# Patient Record
Sex: Male | Born: 1955
Health system: Southern US, Community
[De-identification: ages and names within clinical notes are randomized; demographics above are authoritative.]

## PROBLEM LIST (undated history)

## (undated) DIAGNOSIS — M109 Gout, unspecified: Secondary | ICD-10-CM

## (undated) DIAGNOSIS — E119 Type 2 diabetes mellitus without complications: Secondary | ICD-10-CM

## (undated) DIAGNOSIS — E2740 Unspecified adrenocortical insufficiency: Secondary | ICD-10-CM

## (undated) DIAGNOSIS — G629 Polyneuropathy, unspecified: Secondary | ICD-10-CM

## (undated) DIAGNOSIS — T7840XA Allergy, unspecified, initial encounter: Secondary | ICD-10-CM

## (undated) DIAGNOSIS — I739 Peripheral vascular disease, unspecified: Secondary | ICD-10-CM

## (undated) DIAGNOSIS — I219 Acute myocardial infarction, unspecified: Secondary | ICD-10-CM

## (undated) DIAGNOSIS — I509 Heart failure, unspecified: Secondary | ICD-10-CM

## (undated) DIAGNOSIS — J189 Pneumonia, unspecified organism: Secondary | ICD-10-CM

## (undated) DIAGNOSIS — E7800 Pure hypercholesterolemia, unspecified: Secondary | ICD-10-CM

## (undated) DIAGNOSIS — M1990 Unspecified osteoarthritis, unspecified site: Secondary | ICD-10-CM

## (undated) DIAGNOSIS — B009 Herpesviral infection, unspecified: Secondary | ICD-10-CM

## (undated) DIAGNOSIS — H269 Unspecified cataract: Secondary | ICD-10-CM

## (undated) DIAGNOSIS — G5600 Carpal tunnel syndrome, unspecified upper limb: Secondary | ICD-10-CM

## (undated) DIAGNOSIS — R112 Nausea with vomiting, unspecified: Secondary | ICD-10-CM

## (undated) DIAGNOSIS — I10 Essential (primary) hypertension: Secondary | ICD-10-CM

## (undated) DIAGNOSIS — N529 Male erectile dysfunction, unspecified: Secondary | ICD-10-CM

## (undated) DIAGNOSIS — H5440 Blindness, one eye, unspecified eye: Secondary | ICD-10-CM

## (undated) HISTORY — DX: Peripheral vascular disease, unspecified: I73.9

## (undated) HISTORY — DX: Heart failure, unspecified: I50.9

## (undated) HISTORY — DX: Carpal tunnel syndrome, unspecified upper limb: G56.00

## (undated) HISTORY — DX: Allergy, unspecified, initial encounter: T78.40XA

## (undated) HISTORY — DX: Gout, unspecified: M10.9

## (undated) HISTORY — DX: Male erectile dysfunction, unspecified: N52.9

## (undated) HISTORY — DX: Pneumonia, unspecified organism: J18.9

## (undated) HISTORY — PX: OTHER SURGICAL HISTORY: SHX169

## (undated) HISTORY — DX: Unspecified adrenocortical insufficiency: E27.40

## (undated) HISTORY — DX: Acute myocardial infarction, unspecified: I21.9

## (undated) HISTORY — DX: Pure hypercholesterolemia, unspecified: E78.00

## (undated) HISTORY — DX: Polyneuropathy, unspecified: G62.9

## (undated) HISTORY — PX: CHOLECYSTECTOMY: SHX55

## (undated) HISTORY — DX: Unspecified cataract: H26.9

## (undated) HISTORY — DX: Herpesviral infection, unspecified: B00.9

---

## 1997-09-26 ENCOUNTER — Emergency Department (HOSPITAL_COMMUNITY): Admission: EM | Admit: 1997-09-26 | Discharge: 1997-09-26 | Payer: Self-pay

## 1997-09-28 ENCOUNTER — Ambulatory Visit (HOSPITAL_COMMUNITY): Admission: RE | Admit: 1997-09-28 | Discharge: 1997-09-28 | Payer: Self-pay | Admitting: Family Medicine

## 1997-09-30 ENCOUNTER — Ambulatory Visit (HOSPITAL_COMMUNITY): Admission: RE | Admit: 1997-09-30 | Discharge: 1997-09-30 | Payer: Self-pay | Admitting: Family Medicine

## 1999-06-20 ENCOUNTER — Encounter: Payer: Self-pay | Admitting: Family Medicine

## 1999-06-20 ENCOUNTER — Ambulatory Visit (HOSPITAL_COMMUNITY): Admission: RE | Admit: 1999-06-20 | Discharge: 1999-06-20 | Payer: Self-pay | Admitting: Family Medicine

## 1999-10-06 ENCOUNTER — Encounter: Payer: Self-pay | Admitting: Emergency Medicine

## 1999-10-06 ENCOUNTER — Inpatient Hospital Stay (HOSPITAL_COMMUNITY): Admission: EM | Admit: 1999-10-06 | Discharge: 1999-10-10 | Payer: Self-pay | Admitting: Emergency Medicine

## 1999-10-07 ENCOUNTER — Encounter: Payer: Self-pay | Admitting: Cardiology

## 2003-07-26 ENCOUNTER — Emergency Department (HOSPITAL_COMMUNITY): Admission: EM | Admit: 2003-07-26 | Discharge: 2003-07-26 | Payer: Self-pay | Admitting: Emergency Medicine

## 2004-07-11 ENCOUNTER — Emergency Department (HOSPITAL_COMMUNITY): Admission: EM | Admit: 2004-07-11 | Discharge: 2004-07-12 | Payer: Self-pay | Admitting: Emergency Medicine

## 2004-09-09 ENCOUNTER — Emergency Department (HOSPITAL_COMMUNITY): Admission: EM | Admit: 2004-09-09 | Discharge: 2004-09-09 | Payer: Self-pay | Admitting: Emergency Medicine

## 2005-01-29 ENCOUNTER — Emergency Department (HOSPITAL_COMMUNITY): Admission: EM | Admit: 2005-01-29 | Discharge: 2005-01-29 | Payer: Self-pay | Admitting: *Deleted

## 2005-01-31 ENCOUNTER — Encounter (INDEPENDENT_AMBULATORY_CARE_PROVIDER_SITE_OTHER): Payer: Self-pay | Admitting: Specialist

## 2005-01-31 ENCOUNTER — Inpatient Hospital Stay (HOSPITAL_COMMUNITY): Admission: EM | Admit: 2005-01-31 | Discharge: 2005-02-02 | Payer: Self-pay | Admitting: Emergency Medicine

## 2005-02-26 HISTORY — PX: CORONARY ANGIOPLASTY WITH STENT PLACEMENT: SHX49

## 2005-11-06 ENCOUNTER — Inpatient Hospital Stay (HOSPITAL_COMMUNITY): Admission: EM | Admit: 2005-11-06 | Discharge: 2005-11-08 | Payer: Self-pay | Admitting: Emergency Medicine

## 2005-11-08 ENCOUNTER — Encounter: Payer: Self-pay | Admitting: Vascular Surgery

## 2005-12-13 ENCOUNTER — Encounter (HOSPITAL_COMMUNITY): Admission: RE | Admit: 2005-12-13 | Discharge: 2006-02-01 | Payer: Self-pay | Admitting: Interventional Cardiology

## 2006-01-08 ENCOUNTER — Encounter: Payer: Self-pay | Admitting: Vascular Surgery

## 2006-01-08 ENCOUNTER — Ambulatory Visit (HOSPITAL_COMMUNITY): Admission: RE | Admit: 2006-01-08 | Discharge: 2006-01-08 | Payer: Self-pay | Admitting: Family Medicine

## 2006-01-30 ENCOUNTER — Ambulatory Visit (HOSPITAL_COMMUNITY): Admission: RE | Admit: 2006-01-30 | Discharge: 2006-01-30 | Payer: Self-pay | Admitting: Vascular Surgery

## 2006-04-01 ENCOUNTER — Ambulatory Visit: Payer: Self-pay | Admitting: Vascular Surgery

## 2006-05-24 ENCOUNTER — Ambulatory Visit: Payer: Self-pay | Admitting: Vascular Surgery

## 2008-04-20 ENCOUNTER — Ambulatory Visit: Payer: Self-pay | Admitting: Internal Medicine

## 2008-04-20 ENCOUNTER — Inpatient Hospital Stay (HOSPITAL_COMMUNITY): Admission: EM | Admit: 2008-04-20 | Discharge: 2008-04-23 | Payer: Self-pay | Admitting: Emergency Medicine

## 2008-04-21 ENCOUNTER — Ambulatory Visit: Payer: Self-pay | Admitting: *Deleted

## 2008-04-21 ENCOUNTER — Encounter (INDEPENDENT_AMBULATORY_CARE_PROVIDER_SITE_OTHER): Payer: Self-pay | Admitting: Internal Medicine

## 2008-09-12 ENCOUNTER — Ambulatory Visit: Payer: Self-pay | Admitting: Vascular Surgery

## 2008-09-12 ENCOUNTER — Observation Stay (HOSPITAL_COMMUNITY): Admission: EM | Admit: 2008-09-12 | Discharge: 2008-09-12 | Payer: Self-pay | Admitting: Emergency Medicine

## 2008-09-12 ENCOUNTER — Encounter (INDEPENDENT_AMBULATORY_CARE_PROVIDER_SITE_OTHER): Payer: Self-pay | Admitting: Physician Assistant

## 2010-06-13 LAB — LIPASE, BLOOD: Lipase: 20 U/L (ref 11–59)

## 2010-06-13 LAB — CBC
HCT: 31.8 % — ABNORMAL LOW (ref 39.0–52.0)
HCT: 33.2 % — ABNORMAL LOW (ref 39.0–52.0)
HCT: 36.2 % — ABNORMAL LOW (ref 39.0–52.0)
Hemoglobin: 10.4 g/dL — ABNORMAL LOW (ref 13.0–17.0)
MCV: 84.1 fL (ref 78.0–100.0)
MCV: 84.5 fL (ref 78.0–100.0)
Platelets: 475 10*3/uL — ABNORMAL HIGH (ref 150–400)
Platelets: 565 10*3/uL — ABNORMAL HIGH (ref 150–400)
RBC: 3.72 MIL/uL — ABNORMAL LOW (ref 4.22–5.81)
RBC: 3.95 MIL/uL — ABNORMAL LOW (ref 4.22–5.81)
RBC: 4.28 MIL/uL (ref 4.22–5.81)
RDW: 13.9 % (ref 11.5–15.5)
RDW: 14.1 % (ref 11.5–15.5)
WBC: 10.9 10*3/uL — ABNORMAL HIGH (ref 4.0–10.5)
WBC: 12 10*3/uL — ABNORMAL HIGH (ref 4.0–10.5)
WBC: 12.6 10*3/uL — ABNORMAL HIGH (ref 4.0–10.5)
WBC: 9.4 10*3/uL (ref 4.0–10.5)

## 2010-06-13 LAB — COMPREHENSIVE METABOLIC PANEL
ALT: 21 U/L (ref 0–53)
AST: 18 U/L (ref 0–37)
Albumin: 2.5 g/dL — ABNORMAL LOW (ref 3.5–5.2)
CO2: 25 mEq/L (ref 19–32)
Calcium: 9.1 mg/dL (ref 8.4–10.5)
Chloride: 96 mEq/L (ref 96–112)
Creatinine, Ser: 1.18 mg/dL (ref 0.4–1.5)
GFR calc Af Amer: >60 mL/min (ref >60)
Sodium: 134 mEq/L — ABNORMAL LOW (ref 135–145)

## 2010-06-13 LAB — GLUCOSE, CAPILLARY
Glucose-Capillary: 162 mg/dL — ABNORMAL HIGH (ref 70–99)
Glucose-Capillary: 233 mg/dL — ABNORMAL HIGH (ref 70–99)
Glucose-Capillary: 275 mg/dL — ABNORMAL HIGH (ref 70–99)
Glucose-Capillary: 296 mg/dL — ABNORMAL HIGH (ref 70–99)
Glucose-Capillary: 303 mg/dL — ABNORMAL HIGH (ref 70–99)
Glucose-Capillary: 375 mg/dL — ABNORMAL HIGH (ref 70–99)

## 2010-06-13 LAB — BASIC METABOLIC PANEL
CO2: 26 mEq/L (ref 19–32)
CO2: 27 mEq/L (ref 19–32)
Chloride: 99 mEq/L (ref 96–112)
GFR calc non Af Amer: >60 mL/min (ref >60)
GFR calc non Af Amer: >60 mL/min (ref >60)
Glucose, Bld: 207 mg/dL — ABNORMAL HIGH (ref 70–99)
Glucose, Bld: 225 mg/dL — ABNORMAL HIGH (ref 70–99)
Potassium: 3.7 mEq/L (ref 3.5–5.1)
Potassium: 4.2 mEq/L (ref 3.5–5.1)
Sodium: 135 mEq/L (ref 135–145)
Sodium: 136 mEq/L (ref 135–145)

## 2010-06-13 LAB — CULTURE, BLOOD (ROUTINE X 2): Culture: NO GROWTH

## 2010-06-13 LAB — DIFFERENTIAL
Basophils Absolute: 0 10*3/uL (ref 0.0–0.1)
Basophils Absolute: 0.1 10*3/uL (ref 0.0–0.1)
Eosinophils Absolute: 0 10*3/uL (ref 0.0–0.7)
Eosinophils Absolute: 0.1 10*3/uL (ref 0.0–0.7)
Eosinophils Relative: 0 % (ref 0–5)
Eosinophils Relative: 1 % (ref 0–5)
Lymphocytes Relative: 12 % (ref 12–46)
Lymphocytes Relative: 15 % (ref 12–46)
Lymphocytes Relative: 15 % (ref 12–46)
Lymphocytes Relative: 22 % (ref 12–46)
Lymphs Abs: 1.3 10*3/uL (ref 0.7–4.0)
Lymphs Abs: 1.9 10*3/uL (ref 0.7–4.0)
Lymphs Abs: 2.1 10*3/uL (ref 0.7–4.0)
Monocytes Absolute: 0.9 10*3/uL (ref 0.1–1.0)
Monocytes Absolute: 1.1 10*3/uL — ABNORMAL HIGH (ref 0.1–1.0)
Monocytes Relative: 9 % (ref 3–12)
Neutro Abs: 6 10*3/uL (ref 1.7–7.7)
Neutro Abs: 8.6 10*3/uL — ABNORMAL HIGH (ref 1.7–7.7)
Neutrophils Relative %: 74 % (ref 43–77)
Neutrophils Relative %: 79 % — ABNORMAL HIGH (ref 43–77)

## 2010-06-13 LAB — CARDIAC PANEL(CRET KIN+CKTOT+MB+TROPI)
CK, MB: 1.5 ng/mL (ref 0.3–4.0)
Relative Index: 0.5 (ref 0.0–2.5)
Relative Index: 0.5 (ref 0.0–2.5)

## 2010-06-13 LAB — HEPARIN LEVEL (UNFRACTIONATED)
Heparin Unfractionated: 0.18 IU/mL — ABNORMAL LOW (ref 0.30–0.70)
Heparin Unfractionated: 0.2 IU/mL — ABNORMAL LOW (ref 0.30–0.70)

## 2010-06-13 LAB — CK TOTAL AND CKMB (NOT AT ARMC)
Relative Index: 0.4 (ref 0.0–2.5)
Total CK: 299 U/L — ABNORMAL HIGH (ref 7–232)

## 2010-07-11 NOTE — Consult Note (Signed)
NAMEVERLIN, UHER                 ACCOUNT NO.:  [id number]   MEDICAL RECORD NO.:  [id number]          PATIENT TYPE:  INP   LOCATION:  4732                         FACILITY:  [facility]   PHYSICIAN:  Bevelyn Buckles. Bensimhon, MDDATE OF BIRTH:  [birth year]   DATE OF CONSULTATION:  04/20/2008  DATE OF DISCHARGE:                                 CONSULTATION   REASON FOR CONSULTATION:  Non-ST elevation myocardial infarction.   HISTORY OF THE PRESENT ILLNESS:  The patient is a very pleasant 55-year-  old male with a history of hypertension, diabetes, peripheral arterial  disease, previous nonischemic cardiomyopathy with recovered ejection  fraction, and coronary artery disease.  He is followed by Dr. Eldridge Dace.  He first underwent cardiac catheterization in 2001, which showed a  severe dilated cardiomyopathy with an EF of 24%.  He had single-vessel  coronary artery disease with high-grade disease in the left circumflex;  this was treated medically.  Back in September 2007 he presented with  unstable angina.  He underwent cardiac catheterization and the ejection  fraction had normalized, he had two-vessel coronary artery disease with  persistent high-grade disease in the left circumflex, and high-grade  disease in the mid and distal right coronary.  He underwent stenting of  these lesions of with Cypher drug-eluting stents.  He has done well  since that time.   Over the past 2 weeks he has had severe coughing with fevers and chills.  Over the past few days he has now developed nausea, vomiting and  diarrhea.  He has been unable to hold any of his medications down.  Last  night he developed chest pain.  This was pleuritic in nature, worse with  coughing; however, the chest pain persisted throughout the evening so he  came to the ER today for evaluation.  Chest x-ray showed a left lower  lobe pneumonia.  EKG showed sinus rhythm with LVH.  He had some  nonspecific ST-T wave changes inferiorly, which  were new since 2007.  His cardiac markers showed a troponin of 1.42 with a CK of 299 and MB of  1.38.   REVIEW OF SYSTEMS:  The review of systems is as per the HPI and problem  list, otherwise all systems are negative.   PAST MEDICAL HISTORY:  1. A history of nonischemic cardiomyopathy.      a.     Cardiac catheterization in 2001 showed an EF of 24% ; this       has subsequently recovered.  2. Coronary artery disease.      a.     Most recent catheterization in the 2007 showed two-vessel       disease with high-grade disease in the left circumflex, which was       chronic and two high-grade lesions in the right coronary.  He is       status post Cypher drug-eluting stents to both.  3. Severe peripheral arterial disease with total occlusion of      bilateral superficial femoral arteries, followed by Dr. Arbie Cookey.  4. Chronic hypertension.  5. Diabetes.  6. Hyperlipidemia.  ALLERGIES:  No known drug allergies.   MEDICATIONS:  The patient's medications currently are:  1. Aspirin 325.  2. Diovan 320/25 a day.  3. Plavix 75 a day.  4. Metformin 1,000 twice a day.  5. Insulin.   SOCIAL HISTORY:  The patient is retired and disabled.  He denies any  tobacco or alcohol use.   FAMILY HISTORY:  The patient family history is positive in that his  father died of an MI at age 29.  Mother died and had diabetes and breast  cancer at age 69.   PHYSICAL EXAMINATION:  GENERAL APPEARANCE:  On physical exam he is in no  acute distress.  VITAL SIGNS:  Respirations are unlabored.  Blood pressure is 150/70,  heart rate is 88 and he is satting 94% on room air.  HEENT:  The head, eyes, ears, nose, and throat are normal.  NECK:  The neck is thick and supple.  There is no JVD.  Carotids are 2+  bilaterally with a prominent bruit on the left.  HEART:  Cardiac - the PMI is nondisplaced.  Regular rate and rhythm.  No  murmurs, rubs or gallops.  LUNGS:  The lungs are clear.  ABDOMEN:  The abdomen is  obese, nontender and nondistended.  No  hepatosplenomegaly.  No bruits.  No masses.  Good bowel sounds.  EXTREMITIES:  The extremities are warm with no cyanosis, clubbing or  edema.  No rash.  NEUROLOGIC EXAMINATION:  Neuro - shows he is alert and oriented x3.  Cranial nerves II-XII are intact.  He moves all four extremities without  difficulty.  PSYCHIATRIC:  Affect is pleasant.   LABORATORY DATA:  EKG shows sinus rhythm with LVH at a rate of 94.  There is mild nonspecific ST-T wave abnormalities in the inferior leads.  These are new, ischemia versus repolarization.  Chest x-ray shows left  lower pneumonia.  Labs show a white count of 12.0 with 76% neutrophils,  hemoglobin of 12.0 and platelet of 565,000.  Sodium 134, potassium 4.5,  BUN 15, creatinine 1.18, and glucose 203.  Troponin is 1.42.  Lipase is  16.  CK/MB is 299 with an MB of 1.3.   ASSESSMENT:  1. Non-ST elevation myocardial infarction.  2. Known coronary artery disease status post previous stenting of the      right coronary artery.  3. Pneumonia.  4. Recent gastrointestinal illness.  5. Diabetes.  6. Hypertension.   PLANS AND DISCUSSION:  I suspect he has had some demand ischemia in the  setting of his pneumonia and GI illness though I do not see any evidence  of stent thrombosis despite being unable to take his Plavix.  Given his  positive troponin with negative MBs I suspect that his non-ST elevation  myocardial infarction maybe 24 hours old or so.  1. At this point he will be admitted to the primary care service for      further management of this pneumonia and GI illness.  2. We will continue cycle cardiac markers.  3. We will treat him with heparin, beta blocker, Plavix and aspirin.  4. The patient will see Dr. Eldridge Dace tomorrow for further plan of      care.  5. We will also ultrasound his left carotid to evaluate his bruit.      Bevelyn Buckles. Bensimhon, MD  Electronically Signed     DRB/MEDQ  D:   04/21/2008  T:  04/21/2008  Job:  [id number]

## 2010-07-11 NOTE — H&P (Signed)
[name], [name]                 ACCOUNT NO.:  [id number]   MEDICAL RECORD NO.:  [id number]          PATIENT TYPE:  EMS   LOCATION:  MAJO                         FACILITY:  [facility]   PHYSICIAN:  Hollice Espy, M.D.DATE OF BIRTH:  [birth year]   DATE OF ADMISSION:  04/20/2008  DATE OF DISCHARGE:                              HISTORY & PHYSICAL   PRIMARY CARE PHYSICIAN:  Robert A. Nicholos Johns, M.D.   CARDIOLOGIST:  Corky Crafts, MD.   CHIEF COMPLAINT:  Chest pain.   HISTORY OF PRESENT ILLNESS:  The Patient is a 52-year white male who for  the past couple weeks has had right upper respiratory symptoms including  cough, congestion and some mild shortness of breath and then starting  yesterday started complaining of some chest pain described as both  midsternal as well as midepigastric radiating to his back.  He went in  to be evaluated and found to have a white count of 12 with 76% shift.  The rest of his labs are noted for a glucose of 203, albumin 2.5 and  most concerning was his cardiac markers with a CPK of 299 and MB of 1.33  and troponin of 1.42.  A chest x-ray noted a left lower lobe  consolidation consistent with pneumonia.  The patient was seen by Dr.  Nicholes Mango covering for [facility] and was started on heparin  with serial cardiac enzymes.  Dr. Gala Romney felt that perhaps this could  be strain from his URI versus pneumonia leading to some cardiac  ischemia.  I saw the patient who was breathing much more comfortably.  He said his breathing was easier.  He denied any chest pain, currently  denied any headaches, vision changes, dysphagia, no palpitations.  No  current wheezing.  Did complain of some mild cough, mostly  nonproductive.  No abdominal pain.  No hematuria, dysuria, constipation,  diarrhea, focal extremity numbness, weakness or pain.  His review of  systems was otherwise negative.   PAST MEDICAL HISTORY:  1. Diabetes mellitus.  2. CAD.  3. History  of MI.  4. Hypertension.   MEDICATIONS:  1. Aspirin 325.  2. Diovan/HCTZ 320/25.  3. Plavix 75.  4. Metoprolol 25 b.i.d.  5. Metformin 1000 p.o. b.i.d.  6. Insulin 70/30 52 units in the morning 32 units in the evening.   ALLERGIES:  IV DYE.   SOCIAL HISTORY:  No tobacco, alcohol or drug use.   FAMILY HISTORY:  Noncontributory.   PHYSICAL EXAMINATION:  VITALS:  On admission, temperature 98.9, heart  rate 92, blood pressure 158/81, respirations 16, O2 sat 94% on room air,  96% on 2 liters.  GENERAL:  He is alert and oriented x3 in no apparent distress.  HEENT:  Normocephalic atraumatic.  Mucous membranes slightly dry.  He  has a narrow airway.  HEART:  Regular rate and rhythm S1-S2, 2/6 systolic ejection murmur.  LUNGS:  Decreased breath sounds at bases.  ABDOMEN:  Soft, obese, nontender, positive bowel sounds.  EXTREMITIES:  No clubbing, cyanosis, trace pitting edema.   LABORATORY DATA:  Chest x-ray is  as per HPI.  White count 12, H&H 12 and  36, MCV of 85, platelet count 565, no shift.  Sodium 134, potassium 4.5,  chloride 96, bicarb 25, BUN 15, creatinine 1.18, glucose 203.  LFTs  noted for albumin 2.5.  CPK 299, MB 1.3, troponin I 1.42.  I have  ordered a lipase level at the next blood draw with serial cardiac  enzymes.   ASSESSMENT/PLAN:  1. Non-ST elevated MI (?) strain from pneumonia.  2. Pneumonia.  3. Diabetes mellitus.  Will plan to start IV Avelox, check serial      markers.  Cardiology is following.  Started on heparin.  Continue      sliding scale plus basal insulin and check a lipase level to rule      out any abdominal pathology given his complaints of midepigastric      pain.      Hollice Espy, M.D.  Electronically Signed     SKK/MEDQ  D:  04/20/2008  T:  04/21/2008  Job:  [id number]   cc:   Molly Maduro A. Nicholos Johns, M.D.  Corky Crafts, MD  Bevelyn Buckles. Bensimhon, MD

## 2010-07-11 NOTE — Discharge Summary (Signed)
NAMECOLBEY, WIRTANEN                 ACCOUNT NO.:  [id number]   MEDICAL RECORD NO.:  [id number]          PATIENT TYPE:  INP   LOCATION:  2507                         FACILITY:  [facility]   PHYSICIAN:  Corinna L. Lendell Caprice, MDDATE OF BIRTH:  11/23/1955   DATE OF ADMISSION:  04/20/2008  DATE OF DISCHARGE:  04/23/2008                               DISCHARGE SUMMARY   DISCHARGE DIAGNOSES:  1. Non-ST segment elevation myocardial infarction.  2. Left lower lobe pneumonia.  3. Hypertension.  4. Diabetes.  5. IV CONTRAST allergy.  6. Mid right coronary artery disease, found on catheterization, status      post drug-eluting stent.  7. History of previous myocardial infarction.   DISCHARGE MEDICATIONS:  1. Aspirin 325 mg a day.  2. Plavix 75 mg a day.  3. Avelox 400 mg a day until gone.  4. Tessalon 200 mg p.o. q.8 h p.r.n. cough.  5. Continue Diovan/HCT 320/25 mg a day.  6. Metoprolol 25 mg twice a day.  7. Metformin 1000 mg twice a day.  8. Insulin 70/30, 52 units subcutaneously every morning and 32 units      subcutaneously every evening.   CONDITION:  Stable.   ACTIVITY:  Increase slowly.   FOLLOWUP:  With Dr. Eldridge Dace on March 12, at 11:30 a.m.  Follow up with  Dr. Elias Else in 2-4 weeks, will need repeat chest x-ray in 4-6  weeks.   DIET:  Should be diabetic, low-salt.   PROCEDURES:  Cardiac catheterization and drug-eluting stent as above.  Normal ejection fraction.  Please see cath report for full details.   LABORATORY DATA:  White count on admission was 12,000 with a normal  differential.  At discharge, his white count is 11,000.  His complete  metabolic panel on admission significant for a glucose of 203, albumin  of 2.5, otherwise unremarkable.  Peak troponin was 1.42, peak CPK 308,  and MB 1.5.  Lipase normal.  Blood cultures to date are negative.   SPECIAL STUDIES:  Radiology; chest x-ray on admission showed left lower  lobe consolidation.  EKG showed normal sinus  rhythm, prolonged QT.   HISTORY AND HOSPITAL COURSE:  Mr. Lehnen is a 55 year old black male  patient of Dr. Nicholos Johns who presented with chest pain.  He had had cough  and chest congestion with some mild shortness of breath for several  weeks.  He developed chest pain the day prior to admission.  He has a  history of previous MI.  He had been having difficulty with nausea and  apparently skipped his cardiac medications for a bit.  EKG showed no ST  elevation, but he had a positive troponin and a chest x-ray showing  pneumonia.  He was seen by Cardiology who recommended heparin and  cardiac catheterization as well as continuing the aspirin, Plavix, beta-  blocker, etc.  He was admitted to Internal Medicine and started on  antibiotics as well as cough suppressants.  On admission, he had a blood  pressure of 158/81, otherwise normal vital signs.  He had dry mucous  membranes, decreased breath sounds,  otherwise unremarkable exam.  At the  time of discharge, he had had no chest pain.  His cough had improved.  He was cleared by Cardiology and has been off oxygen and able to  ambulate and tolerate diet and medications.   Total time on the day of discharge is 40 minutes.      Corinna L. Lendell Caprice, MD  Electronically Signed     CLS/MEDQ  D:  04/23/2008  T:  04/24/2008  Job:  [id number]   cc:   Molly Maduro A. Nicholos Johns, M.D.  Corky Crafts, MD

## 2010-07-14 NOTE — Discharge Summary (Signed)
[facility]  Patient:    [name], [name]                        MRN: [id number] Adm. Date:  [id number] Disc. Date: [id number] Attending:  [place], Peter Manning CC:         Willis Modena. Dreiling, M.D.             Robert A. Eliezer Lofts., M.D.                           Discharge Summary  HISTORY OF PRESENT ILLNESS:  [name] is a 55 year old black male with a history of insulin-dependent diabetes mellitus and hypertension that have been poorly controlled who presents with acute onset of shortness of breath, was found to be in acute pulmonary edema.  He has no prior history of cardiac disease and denies any history of angina or myocardial infarction.  For details of his past medical history, social history, and physical examination, please see his admission history and physical.  LABORATORY DATA:  Chest x-ray showed cardiomegaly with acute pulmonary edema. ECG showed normal sinus rhythm, left atrial enlargement and nonspecific T wave abnormality.  His white count was 7100, hemoglobin 14.7, hematocrit 44.4, platelet count was 323,000.  Pro time was normal at 12.9, PTT was 27.  Sodium 142, potassium 3.7, chloride 108, CO2 20.  Glucose is 194, BUN 12, creatinine 0.9.  All other chemistries were normal.  CK was 289, negative MB at 2.8.  Troponin I was 0.08.  Serial cardiac enzymes showed no evidence of myocardial infarction. Lipid profile showed a total cholesterol of 237, LDL cholesterol of 0.78, triglycerides of 97 and HDL of 40.  TSH was normal at 3.55.  Urinalysis was benign.  HOSPITAL COURSE:  The patient was admitted to the intensive care unit.  He was initially severely hypertensive.  With treatment in the emergency room with diuretics and nitroglycerin, his blood pressure dropped.  This stabilized with IV fluid bolus and he was able to be restarted on his IV nitroglycerin.  He was diuresed with IV Lasix q.12h.  He was also placed on IV heparin and aspirin.  The  patient had a very effective diureses, marked improvement in his shortness of breath, decline in his weight and improvement of his pulmonary congestion by examination.  Followup chest x-ray showed persistent cardiomegaly with resolution of pulmonary edema.  His serial ECG showed no acute changes.  In addition he was started on an ACE inhibitor with Altace and this was progressively increased by the time of discharge up to 5 mg twice a day.  With resolution of his pulmonary edema he was also started on Coreg at a low dose of 3.125 mg twice a day.  He was started on Lipitor for his hypercholesterolemia.  He was maintained on insulin at his prior dose, but during his hospital stay his sugar dropped and so his insulin dose was reduced.  The patient had no further chest pain.  He did have an echocardiogram performed and this demonstrated moderate left ventricular dilatation and markedly reduced global left ventricular systolic function, ejection fraction estimated at 20-30%.  There is akinesis of the inferior wall septum with severe hypokinesia of the anterior wall and apex.  There was mild concentric LVH.  There was mild aortic sclerosis.  The left atrium was moderately dilated.  There was no significant valvular pathology.  There is no pericardial  effusion.  With resolution of his congestive heart failure, the patient was transferred to telemetry.  He progressively ambulated without difficulty.  On October 09, 1999, the patient underwent right and left heart catheterization.  This demonstrated normal right heart pressures with a right atrial pressure, mean 7 mmHg.  His right ventricular pressure was 35/11.  PA pressure was 33/14 with a mean of 22.  Pulmonary capillary wedge pressure, mean was 16 mmHg.  His cardiac output was 4.6 L/min. with an index of 2.0. There was no evidence of shunt.  Left ventricular angiography demonstrated moderate left ventricular enlargement with severe global  hypokinesia. Ejection fraction was calculated at 24%.  There was no mitral regurgitation. Coronary angiography was significant for an 80-90% stenosis in the mid to distal left circumflex.  This was the only significant obstruction that he had.  It was felt that based on these results, his cardiomyopathy was nonischemic since the degree of atherosclerosis could not explain the severity of his left ventricular dysfunction.  While the circumflex lesion did appear to be amenable to angioplasty it was felt that this was not indicated unless the patient had significant anginal symptoms.  Further adjustments in his medications, his blood pressure improved.  His weight declined to 160 pounds prior to discharge.  He was discharged home in stable condition on October 10, 1999.  DISCHARGE DIAGNOSES: 1. Congestive heart failure. 2. Dilated cardiomyopathy, nonischemic. 3. Atherosclerotic coronary artery disease, single-vessel. 4. Hypertension. 5. Insulin-dependent diabetes mellitus. 6. Severe hypercholesterolemia. 7. Obesity.  DISCHARGE MEDICATIONS: 1. Coated aspirin 325 mg daily. 2. Imdur 60 mg per day. 3. Potassium 20 mEq daily. 4. Coreg 6.25 mg twice a day. 5. Lasix 40 mg per day. 6. Altace 5 mg twice a day. 7. Lipitor 10 mg per day. 8. Insulin 70/30 30 units subcu in the morning, 40 units in the evening. 9. Nitroglycerin 1/150 sublingual p.r.n. chest pain.  The patient is to progressively walk.  He is not to return to work for at least a week at which time he is to see Dr. [place] and can make recommendations concerning returning to work.  He will avoid lifting over 20 pounds.  Review recommendations for a low fat, low salt, ADA diet.  Schedule followup visit with Dr. [place] in one week.  Recommended followup with Dr. Nicholos Johns in two to three weeks.  DISCHARGE STATUS:  Improved. DD:  10/10/99 TD:  10/11/99 Job: [id number]

## 2010-07-14 NOTE — Consult Note (Signed)
[place]  Patient:    [name], [name]                        MRN: [id number] Proc. Date: 10/06/99 Adm. Date:  [id number] Attending:  [place], Peter Manning CC:         Dr. Genene Churn. Dreiling, M.D.   Consultation Report  CHIEF COMPLAINT:  Shortness of breath.  HISTORY OF PRESENT ILLNESS:  Mr. Boys is a 55 year old black male with history of insulin-dependent diabetes mellitus and hypertension who presents with acute onset of wheezing at 10 p.m. last evening.  This was followed by pain in his back and tightness in his chest.  He was diaphoretic.  He denied any nausea/vomiting.  His shortness of breath worsened, and he presented to the emergency room, where he is found to be in acute pulmonary edema.  Patient had noticed intermittent wheezing for several months, relieved with rest.  He has had no prior history of coronary disease, myocardial infarction, or congestive heart failure.  He has no history of any cardiac disease.  He has had longstanding hypertension and diabetes, both of which have been poorly controlled, by history.  PAST MEDICAL HISTORY:  Insulin-dependent diabetes mellitus, hypertension.  He has had previous right knee surgery.  Has no known history of hypercholesterolemia.  ALLERGIES:  No known allergies.  MEDICATIONS:  Insulin 70/30 75 units subcu q.a.m., 30 units subcu q.p.m.  He is on unknown blood pressure medication.  SOCIAL HISTORY:  He is divorced, he has two children.  He works as a Estate agent.  He denies tobacco or alcohol use.  FAMILY HISTORY:  Father died age 33 of myocardial infarction and hypertension. Mother died age 110 of breast cancer, also had hypertension and diabetes.  Has four sisters with positive history of diabetes, hypertension, ovarian cancer.  REVIEW OF SYSTEMS:  He denies any history of peptic ulcer disease, any change in his bowel habits or any bleeding problems.  He has had no  recent increase in edema or orthopnea.  No recent fever, chills, or other infection.  Denies any claudication symptoms, denies any history of diabetic retinopathy or nephropathy.  PHYSICAL EXAMINATION:  GENERAL:  Patient is an obese black male in no distress.  VITAL SIGNS:  Currently, blood pressure is 117/64, pulse 90 and regular, respirations 22.  He is afebrile.  HEENT:  Unremarkable.  His pupils are equal, round, and reactive.  His conjunctivae are clear, oropharynx is clear.  NECK:  His neck is very thick.  Cannot really assess jugular venous distention.  He had no bruits.  LUNGS:  Reveal bilateral crackles two-thirds of the way up.  CARDIAC:  Reveals a normal apical impulse.  He has a regular rate and rhythm, normal S1 and S2, without gallops, murmurs, rubs, or clicks.  ABDOMEN:  Obese, soft, nontender.  He has positive bowel sounds.  There is no hepatosplenomegaly.  EXTREMITIES:  Without edema.  Femoral and pedal pulses are palpable.  NEUROLOGIC:  Intact.  LABORATORY DATA:  ECG shows normal sinus rhythm with T wave abnormality consistent with anterior ischemia.  Chest x-ray shows marked cardiomegaly with pulmonary edema.  Sodium 142, potassium 3.7, chloride 108, CO2 30, BUN 12, creatinine 0.9, glucose 194.  White count 7100; hemoglobin 14.7; hematocrit 44.4; platelets 323,000.  CK is 287 with 2.8 MB.  IMPRESSION: 1. Acute pulmonary edema/congestive heart failure.  Etiology is unclear.  Most    likely  diagnosis is hypertensive and diabetic heart disease with dilated    cardiomyopathy.  Also need to consider possible coronary ischemia and/or    infarction.  It appears less likely by exam that this is valvular or    pericardial disease. 2. Hypertension, poorly controlled by history. 3. Insulin-dependent diabetes mellitus. 4. Obesity. 5. Family history of early coronary disease.  PLAN:  The patient will be admitted to intensive care, rule out  myocardial infarction.  He will be treated with aspirin, IV nitroglycerin, heparin, and IV diuresis.  He will also be started on ACE inhibitor.  Will obtain an echocardiogram in the morning.  We will cover him with his usual insulin dose plus sliding scale insulin.  Will obtain a lipid panel.  Patient may require cardiac catheterization once his pulmonary edema has improved.DD:  10/06/99 TD:  10/06/99 Job: [id number]

## 2010-07-14 NOTE — Discharge Summary (Signed)
NAMEMATIN, MATTIOLI                 ACCOUNT NO.:  [id number]   MEDICAL RECORD NO.:  [id number]          PATIENT TYPE:  INP   LOCATION:  6525                         FACILITY:  [facility]   PHYSICIAN:  Hollice Espy, M.D.DATE OF BIRTH:  September 12, 1955   DATE OF ADMISSION:  11/06/2005  DATE OF DISCHARGE:  11/08/2005                                 DISCHARGE SUMMARY   CONSULTATIONS ON THIS CASE:  Dr. [facility].   PRIMARY CARE PHYSICIAN:  Dr. Fulton Mole.   DISCHARGE DIAGNOSES:  1. Ischemic cardiomyopathy with 2-vessel disease.  Patient is status post      cardiac catheterization on November 07, 2005, with percutaneous      coronary intervention to right coronary artery and stent x2 placement.  2. Chest pain, felt to be secondary to #1.  3. Mild renal artery atherosclerosis.  4. Hypertension, poorly controlled.  5. Diabetes mellitus, poorly controlled.  6. History of dyslipidemia.  7. Obesity.  8. Bilateral carotid bruits, found to be incidental with negative carotid      Doppler for stenosis.  9. History of peripheral neuropathy.   DISCHARGE MEDICATIONS:  Patient will continue on his previous medications  of:  1. Atenolol 100 mg p.o. daily.  2. Aspirin 325 p.o. daily.  3. Diovan/HCTZ 325/25 p.o. daily.  4. Terazosin 10 p.o. daily.  5. Metformin 500 p.o. b.i.d.  6. Crestor 40 mg one-fourth of a tablet daily.  7. 70/30 insulin 30 units subcu b.i.d.   New medications for this patient:  1. Clonidine 0.1 p.o. b.i.d.  2. Plavix 75 mg p.o. daily.   HOSPITAL COURSE:  The patient is a 55 year old white male with past medical  history of poorly-controlled diabetes mellitus, obesity, and hypertension as  well as nonischemic cardiomyopathy, who presents to the emergency room  complaining of episodes of chest pain with occasional dizziness and light-  headedness.  He underwent a cardiac catheterization with a distal coronary  disease without intervention in August of  2001.  He has not followed up with  cardiology since that time.  When he presented to the emergency room, his  EKG was negative.  His enzymes showed a very mild bump up in his troponin I.  Patient was admitted, started on nitroglycerin as well as medication for  blood pressure and diabetes control, and was evaluated by [facility].  [facility] followed up on 09/12.  On evaluation of the patient, they  felt that he may need a possible cath versus stress test.  Since still  having episodes of recurrent chest pain, it was felt best that he go for a  cardiac catheterization.  The patient underwent cardiac cath __________  on  September 12.  There he was found to have significant 2-vessel disease,  underwent PCI to the RCA and two stent placements.  He was found to have  normal left ventricular function.  Dr. Eldridge Dace recommended that the patient  be on aspirin and Plavix indefinitely.  He was continued on Integrilin as  per the cath protocol, and he also recommended aggressive blood pressure  control,  and patient was set up with cardiac rehab on November 08, 2005.  Currently he is feeling well.  He has no chest pain and has had no further  events.  In addition, in regards to his diabetes, the patient admits that he  has very poor control of his diabetes and hemoglobin A1c was elevated at 7,  which is not too terrible.  Therefore, I am not making any additional  adjustments in his insulin regimen, and would favor just continuing as his  current 70/30 insulin.   PLAN:  The plan will be for patient to follow up with Dr. Eldridge Dace in the  next 2-3 weeks as well as Dr. Renato Gails in 1-2 weeks.   Patient's discharge diet would be a heart-healthy, carb-modified diet and  for activity: Follow up with cardiac rehab.   DISPOSITION:  Improved.  He is being discharged to home.      Hollice Espy, M.D.  Electronically Signed     SKK/MEDQ  D:  11/08/2005  T:  11/08/2005  Job:  [id number]

## 2010-07-14 NOTE — H&P (Signed)
[facility]  Patient:    [name], [name]                          MRN: [id number] Adm. Date:  10/06/99 Attending:  Amada Jupiter T. Dreiling, M.D.                         History and Physical  CHIEF COMPLAINT:  Shortness of breath, chest pain and chest pressure.  HISTORY OF PRESENT ILLNESS:  This is a 55 year old black gentleman who developed wheezes and dyspnea approximately 10:00 p.m. tonight.  Shortly after this, he developed chest pressure and dyspnea.  The pressure was across his chest anteriorly.  He proceeded to the emergency room and was found to have florid pulmonary edema.  There was no nausea or prior episode like this.  He has had some episodes of wheezing, which resolved with rest, for the last several months.  PAST SURGICAL HISTORY:  Right DD:  10/06/99 TD:  10/06/99 Job: [id number]

## 2010-07-14 NOTE — H&P (Signed)
[name], [name]                 ACCOUNT NO.:  [id number]   MEDICAL RECORD NO.:  [id number]          PATIENT TYPE:  INP   LOCATION:  3714                         FACILITY:  [facility]   PHYSICIAN:  Jackie Plum, M.D.DATE OF BIRTH:  14-Dec-1955   DATE OF ADMISSION:  11/06/2005  DATE OF DISCHARGE:                                HISTORY & PHYSICAL   CHIEF COMPLAINT:  Chest pain.   HISTORY OF PRESENT ILLNESS:  The patient is a 55 year old African-American  gentleman who came with 24-hour history of sternal chest pain which was  described as sharp radiating to his left arm which lasted several minutes.  It was mild to moderate in intensity without any known aggravating or  alleviating factors.  The patient denies any history of palpitations,  orthopnea, PND, shortness of breath, nausea, vomiting, or diaphoresis.  He  has not had any history of fever or chills.  He denies any cough, sputum  production.  No abdominal pain, dysuria, or frequency of micturition.  He  claims that his lower extremities have been swelling some.   PAST MEDICAL HISTORY:  Positive for:  1. History of diabetes.  2. Hypertension.  3. Dyslipidemia.   FAMILY HISTORY:  Positive for heart disease in his father who died in his  late 75s from heart-related problems.   SOCIAL HISTORY:  Does not smoke cigarettes or drink alcohol.   MEDICATIONS:  Include atenolol, aspirin, and Diovan.  He could not give the  actual doses of these medicines.   ALLERGIES:  He does not have any known medication allergies.   REVIEW OF SYSTEMS:  Otherwise unremarkable.   PHYSICAL EXAMINATION:  VITAL SIGNS: Blood pressure 210/86.  Finger stick  glucose was 148. Respirations 20, pulse 55, temperature 97.5 degrees F. O2  saturation 97% on room air.  GENERAL:  The patient is not in an acute cardiopulmonary distress.  HEENT: Normocephalic and atraumatic.  Pupils equal, round, and reactive to  light.  Extraocular movements intact.   Oropharynx moist.  NECK: Supple.  No JVD, no carotid bruit.  LUNGS: Clear breath sounds.  CARDIAC:  Tachycardic, regular rhythm, no gallops.  ABDOMEN:  Obese, soft, nontender.  Bowel sounds present.  EXTREMITIES: No cyanosis. Trace edema noted.  CNS: Exam was nonfocal.   LABORATORY DATA:  X-ray of chest not available, results pending according to  ED physician.   EKG shows sinus bradycardia at 58 beats per minute.  There were no acute ST  wave changes.   Point-of-care cardiac markers were negative.  Hemoglobin 14.3, hematocrit  32.0.  The patient's pH was 7.40, patient's pCO2 47.6, bicarbonate 29.7.  Sodium 143, potassium 3.5, chloride 106, glucose 100, BUN 8, creatinine 0.9.  Troponin 0.07 and 0.6, respectively.   IMPRESSION:  1. Chest pain in patient with multiple coronary artery disease risk      factors of diabetes, hypertension, dyslipidemia, male sex, and age more      than 45 years.  2. Hypertension.  3. Positive troponin without overt evidence of myocardial infarction.  4. Diabetes.  5. Dyslipidemia.   The patient is admitted  for rule out protocol.  The patient is chest pain  free at time of my evaluation and will treat hypertension aggressively and  follow up telemetry.  If he rules out, will definitely need a stress test  before discharge in the morning.      Jackie Plum, M.D.  Electronically Signed     GO/MEDQ  D:  11/06/2005  T:  11/06/2005  Job:  [id number]   cc:   Molly Maduro A. Nicholos Johns, M.D.

## 2010-07-14 NOTE — Op Note (Signed)
[name], [name]                 ACCOUNT NO.:  [id number]   MEDICAL RECORD NO.:  [id number]            PATIENT TYPE:   LOCATION:                                 FACILITY:   PHYSICIAN:  Larina Earthly, M.D.         DATE OF BIRTH:   DATE OF PROCEDURE:  DATE OF DISCHARGE:                               OPERATIVE REPORT   PREOPERATIVE DIAGNOSIS:  Bilateral lower extremity claudication.   POSTOPERATIVE DIAGNOSIS:  Bilateral lower extremity claudication.   PROCEDURE:  Aortogram with bilateral lower extremity runoff.   SURGEON:  Gretta Began, M.D.   ASSISTANT:  Nurse.   ANESTHESIA:  1% lidocaine local.   COMPLICATIONS:  None.   DISPOSITION:  To recovery room stable.   PROCEDURE IN DETAIL:  The patient was taken to the room, placed in the  supine position where the area of both groins were prepped and draped in  a sterile fashion.  Using local anesthesia and a single-wall puncture  the right common femoral artery was entered and a guidewire was passed  up to the level of the suprarenal aorta.  A 5-French sheath was passed  over the guidewire and a pigtail catheter was positioned at the level of  the suprarenal aorta.  AP projections were undertaken revealing widely  patent single renal arteries bilaterally.  The patient had no evidence  of aortoiliac occlusive disease with normal arteries.   The catheter was then pulled down to the level  of the aortic  bifurcation and runoff was obtained.  This again revealed a widely  patent aortoiliac segments.  Runoff then revealed occlusion of  superficial femoral arteries bilaterally.  There was a short segment  occlusion on the right and a long segment occlusion on the left.  There  was reconstitution of a diseased popliteal artery bilaterally with  severe tibial disease bilaterally.  On the left there was a complete  occlusion of all tibial vessels with occlusion of the popliteal artery  at the knee.  There was reconstitution of the  tibioperoneal trunk with  posterior tibial and peroneal runoff with an occluded anterior tibial  artery.  On the right there was again complete occlusion of the tibial  vessels with reconstitution of posterior tibial artery with flow into  the foot via the posterior tibial artery.  Additional views were  obtained with a peak hole technique, showing definition of this at the  level of the right and left knee.   The patient tolerated the procedure without immediate complication and  was transferred to the holding area in stable condition.   FINDINGS:  1. No evidence of aortoiliac occlusive disease.  2. Bilateral superficial femoral artery occlusion with reconstitution      of the diseased popliteal artery bilaterally.  3. Complete occlusion of tibial vessels bilaterally with      reconstitution of proximal posterior tibial on the right and      posterior tibial and peroneal artery on the left with runoff via      these arteries to the foot.  Larina Earthly, M.D.  Electronically Signed     TFE/MEDQ  D:  01/31/2006  T:  02/01/2006  Job:  [id number]

## 2010-07-14 NOTE — Discharge Summary (Signed)
[name], [name]                 ACCOUNT NO.:  [id number]   MEDICAL RECORD NO.:  [id number]          PATIENT TYPE:  INP   LOCATION:  5729                         FACILITY:  [facility]   PHYSICIAN:  Angelia Mould. Derrell Lolling, M.D.DATE OF BIRTH:  15-Nov-1955   DATE OF ADMISSION:  01/31/2005  DATE OF DISCHARGE:  02/02/2005                                 DISCHARGE SUMMARY   DISCHARGE DIAGNOSES:  1.  Acute gangrenous cholecystitis status post laparoscopic cholecystectomy      by Dr. Corliss Skains on January 31, 2006.  2.  Intravenous dye allergy.  3.  Nonischemic cardiomyopathy.  4.  Hypertension.  5.  Diabetes mellitus.   HOSPITAL COURSE:  [name] is a 55 year old patient, who presented to  [name] Emergency Room on January 29, 2006 with a several day history of right  upper quadrant pain.  He was sent home and was scheduled to be seen in the  surgery clinic today.  However, his symptoms worsened and he presented back  to the emergency room.  His white blood count was normal, his LFTs were  minimally elevated with an AST 57 and an ALT of 208, total bilirubin of 2.   Because of the patient's symptoms, the patient was scheduled for a  cholecystectomy and underwent this procedure on January 31, 2006 under the  care of Dr. Corliss Skains.  At that time, he was diagnosed with acute gangrenous  cholecystitis with gallstones.   By February 02, 2005, the patient was ready for discharge to home.  Please  note that during the patient's hospitalization, the [facility]  was following the patient for his hypertension and diabetes.   The patient was discharged to home on the following medications:  1.  Vicodin for pain.  2.  The patient is to continue his insulin and Glucotrol, Diovan, Atenolol,      and Lipitor as prior to admission.   No driving for one week and no lifting for two weeks, call for a fever  greater than 101.5, or any redness or drainage of incisions.  He is to  follow up with Dr. Corliss Skains on  February 16, 2005 at 9:30 a.m.      Guy Franco, P.A.      Angelia Mould. Derrell Lolling, M.D.  Electronically Signed    LB/MEDQ  D:  03/28/2005  T:  03/28/2005  Job:  [id number]   cc:   Peter M. [place], M.D.  Fax: [phone]   Elana Alm. Nicholos Johns, M.D.  Fax: [phone]

## 2010-07-14 NOTE — Consult Note (Signed)
NAMEKIMBLE, HITCHENS                 ACCOUNT NO.:  [id number]   MEDICAL RECORD NO.:  [id number]          PATIENT TYPE:  INP   LOCATION:  5729                         FACILITY:  [facility]   PHYSICIAN:  Theone Stanley, MD   DATE OF BIRTH:  08-25-1955   DATE OF CONSULTATION:  02/01/2005  DATE OF DISCHARGE:                                   CONSULTATION   REASON FOR CONSULTATION:  Help with management of diabetes and hypertension.   CONSULTING PHYSICIAN:  Dr. Derrell Lolling.   HISTORY OF PRESENT ILLNESS:  Mr. Mallick is a very pleasant 55 year old African  American gentleman with a past medical history of diabetes, hypertension,  hyperlipidemia, nonischemic cardiomyopathy, presenting yesterday with severe  right upper quadrant pain. After evaluation by the general surgeon it was  felt he had acute cholecystitis. He went to the operating room on the 6th  and was found to have a gangrenous gallbladder. Laparoscopic cholecystectomy  was performed. Postoperatively, the patient is doing well. His sugars remain  high and so are his pressures. When I saw him on the 7th he appeared to be  in some amount of pain. He did not appear to be himself and seemed to be  talking slower, but denied any chest pain or shortness of breath. He states  that he had some mild abdominal pain mainly at the incisional area and right  upper quadrant. The patient states his sugars are normally less than 200 at  home and his pressures are reasonably controlled. He was unable to tell me a  recent hemoglobin A1c.   PAST MEDICAL HISTORY:  Diabetes, hypertension, hypercholesterolemia,  nonischemic cardiomyopathy.   PAST SURGICAL HISTORY:  Status post right knee surgery, recent laparoscopic  cholecystectomy for gangrenous gallbladder.   MEDICATIONS:  The patient appeared groggy, and I am not sure what he is on,  and he did not know his dosage, but he indicated he was on Diovan. He is on  70/30, 30 units in the morning and 10 units at  night. He is also on  Glucotrol, Lipitor, and aspirin 81 mg daily.   ALLERGIES:  IV DYE.   SOCIAL HISTORY:  The patient lives in [place]. No tobacco, alcohol, or  illicit drug use.   FAMILY HISTORY:  The patient's father died at age 38 of an MI. Mother died  at age 92 from breast cancer. She also had diabetes, hypertension, and  glaucoma.   REVIEW OF SYSTEMS:  Please see HPI. Otherwise, all systems were negative.   PHYSICAL EXAMINATION:  VITAL SIGNS: T-max 100.6, heart rate 97, respiratory  rate 20, blood pressure 214/85, blood sugars ranging from 175 to 254,  saturating 95% on two liters.  HEENT: Head normocephalic and atraumatic. Eyes are 2 mm. Pupils equal,  round, and reactive to light and accommodation. Extraocular movements are  intact. Ears have no discharge. Throat clear with no erythema or exudate.  Mucosa moist.  NECK: Supple. No lymphadenopathy. No JVD.  HEART: Regular rate and rhythm with murmurs, rubs, or gallops appreciated.  LUNGS: Clear to auscultation bilaterally.  ABDOMEN: Slightly tender; mainly  tender in the right upper quadrant.  EXTREMITIES: No clubbing, cyanosis, or edema.  NEUROLOGIC: Nonfocal.   LABORATORY DATA:  Evidence of elevated AST and ALT. White count 10.  Hemoglobin and hematocrit stable.   ASSESSMENT/PLAN:  1.  Diabetes. We will restart him on his 40/30 at a lower dose. If, however,      at any point in time he is n.p.o. this needs to be stopped. Will switch      him over to a NovoLog insulin sliding scale at the resistant level. I      suspect that he will need adjustments to his hospital stay, especially      with his current stressful situation.  When I asked the patient about      Metformin, he stated that he was not on this. Even if he was at this      point in time in his hospital stay he should avoid this medication and      try to control with insulin. I did add Glucotrol, which he says he is on      at home. Again, the dosage is  unknown. I started him on 5 mg. I will      again hold this if he does become NPO.  2.  History of hypertension. There are several readings that quite high. I      suspect that this is also secondary to pain. However, will add      metoprolol 12.5 mg one p.o. b.i.d. Continue on Diovan and ask the family      to bring in his medications. Hydralazine p.r.n. for blood pressures      greater than 170.  3.  Hypercholesterolemia. I would hold off on his Lipitor because of his      liver issues and restart it when this has resolved. Overall the family      needs to bring in his medications since he does not know and there is no      clear indication as to what his dosages are and what his actual      medications are.   PERIPHERAL ACCESS:  - error      Theone Stanley, MD  Electronically Signed     AEJ/MEDQ  D:  02/01/2005  T:  02/01/2005  Job:  [id number]   cc:   Peter M. [place], M.D.  Fax: [phone]   Elana Alm. Nicholos Johns, M.D.  Fax: [phone]   Angelia Mould. Derrell Lolling, M.D.  [address], [address]  [place]  [place] [address]

## 2010-07-14 NOTE — Consult Note (Signed)
NAMETHADDUS, MCDOWELL                 ACCOUNT NO.:  [id number]   MEDICAL RECORD NO.:  [id number]          PATIENT TYPE:  INP   LOCATION:  6525                         FACILITY:  [facility]   PHYSICIAN:  Corky Crafts, MDDATE OF BIRTH:  1956-01-13   DATE OF CONSULTATION:  11/07/2005  DATE OF DISCHARGE:                                   CONSULTATION   REFERRING PHYSICIAN:  Dr. Julio Sicks.   PRIMARY PHYSICIAN:  Dr. Elias Else.   REASON FOR CONSULTATION:  Chest pain.   HISTORY OF PRESENT ILLNESS:  Mr. Velaquez is a 55 year old man with a history of  nonischemic dilated cardiomyopathy and one-vessel coronary artery disease in  his circumflex.  He was admitted yesterday because of chest pain.  Since he  has been in the hospital, he has had two episodes of chest pain which  occurred at rest.  It felt like heartburn in his chest.  There is no  associated diaphoresis.  He also describes it as a dull ache with radiation  to his left shoulder.  Episodes have lasted anywhere from 10-30 minutes.  There is no shortness of breath.  He has had some occasional dizziness.  In  2001,  he underwent catheterization showing distal circumflex disease.  This  was not treated because the patient is not having any angina at the time.   PAST MEDICAL HISTORY:  1. Diabetes.  2. Hypertension.  3. High cholesterol   PAST SURGICAL HISTORY:  1. Knee surgery.  2. Cholecystectomy.   ALLERGIES:  QUESTION TO CONTRAST DYE.   MEDICINES:  1. Aspirin 325 mg daily.  2. Clonidine 0.1 mg three times daily.  3. Lopressor 50 mg twice daily.  4. NovoLog 11 units 3 times a day.  5. NovoLog 5 units at bedtime.   SOCIAL HISTORY:  The patient does not smoke or drink alcohol.  He lives with  his oldest daughter.  He is a Doctor, hospital at [employer].   FAMILY HISTORY:  Positive for heart disease who died in his late 77s.   REVIEW OF SYSTEMS:  Significant for chest pain with radiation, as mentioned  above.  Normal appetite.  No  nausea or vomiting.  No focal weakness.  All  other systems negative.   PHYSICAL EXAMINATION:  VITAL SIGNS:  Blood pressure is 196/88, pulse 57.  GENERAL:  The patient is awake, alert, no apparent distress.  HEENT:  Head normocephalic, atraumatic.  Eyes:  Extraocular is intact.  NECK:  Supple.  No JVD.  CARDIOVASCULAR:  Bradycardic.  S1-S2.  LUNGS:  Clear to auscultation bilaterally.  ABDOMEN:  Soft, nontender.  EXTREMITIES:  Showed no edema.  SKIN:  Warm and dry.  NEUROLOGIC:  No focal motor or sensory deficits.  BACK: No kyphoscoliosis.   LAB WORK:  Hematocrit of 37.1.  Creatinine 1, glucose 138.  Cardiac enzymes  show troponins ranging from 0.06-0.07, CK is in the 150s-160s, normal MBs.  EKG shows sinus bradycardia with ST-segment depressions noted in the  inferior and lateral leads.  Chest x-ray showed mild cardiomegaly.   ASSESSMENT/PLAN:  This is a 55 year old  with multiple risk factors for heart  disease and unstable angina.   PLAN:  1. Cardiac:  We discussed the options of stress testing and      catheterization.  Since he has had recurrent pain at rest, I think      catheterization would be the more appropriate test.  We will plan for      that later today.  2. The patient will also need aggressive blood pressure control, and we      will add hydralazine intravenously given his slow heart rate.  3. We will check carotid Dopplers to evaluate for carotid artery disease.  4. Unsure of the significance of the mildly elevated troponin.  It may be      related to prior nonischemic cardiomyopathy.  5. The patient will need aggressive diabetes control as well.  6. Continue Statin for cholesterol lowering.      Corky Crafts, MD  Electronically Signed     JSV/MEDQ  D:  11/08/2005  T:  11/09/2005  Job:  [id number]

## 2010-07-14 NOTE — Cardiovascular Report (Signed)
[place]. [facility]  Patient:    [name], [name]                        MRN: [id number] Proc. Date: 10/09/99 Adm. Date:  [id number] Attending:  [place], Peter Manning CC:         Willis Modena. Dreiling, M.D.  Robert A. Eliezer Lofts., M.D.   Cardiac Catheterization  INDICATION FOR PROCEDURE:  Patient is a 55 year old black male with poorly controlled diabetes, hypertension and hypercholesterolemia, who presents with acute pulmonary edema.  Echocardiogram is suggestive of severe dilated cardiomyopathy.  Cardiac enzymes are negative for myocardial infarction.  PROCEDURE:  Right and left heart catheterization; coronary angiography.  ACCESS:  Access was via the right femoral artery and vein using standard Seldinger technique.  EQUIPMENT:  The 6-French 4-cm right and left Judkins catheters, 6-French pigtail catheter, 6-French arterial sheath, 8-French venous sheath, a 7-French balloon-tipped Swan-Ganz catheter.  COMMENTARY:  Patient tolerated procedure well without complications.  MEDICATIONS:  Local anesthesia with 1% Xylocaine.  He was treated prior to procedure with 60 mg of p.o. prednisone and 25 mg of p.o. Benadryl for possible IVP dye allergy.  CONTRAST:  Omnipaque 120 cc.  HEMODYNAMIC DATA Aortic pressure:  Right atrial pressure is 6/6 with a mean of 7 mmHg.  Right ventricular pressure is 35 with an EDP of 11 mmHg.  Pulmonary artery pressure is 33/14 with a mean of 22 mmHg.  Pulmonary capillary wedge pressure is 12/20 with a mean of 16 mmHg.  Left ventricular pressure is 132 with an EDP of 17 mmHg.  Aortic pressure is 135/93 with a mean of 111 mmHg.  There is no mitral valve gradient or aortic valve gradient.  There is no shunt.  Cardiac output by thermodilution is 4.6 l/min with an index of 1.99.  Cardiac output by Fick is 4.7 l/min with an index of 2.05.  ANGIOGRAPHIC DATA:  Left ventricular angiography performed in the RAO view demonstrates moderately  enlarged left ventricular size.  There is severe global hypokinesia with ejection fraction calculated at 24%.  There is no mitral regurgitation or prolapse.  The aortic valve appears normal.  The left coronary artery arises and distributes normally.  The left main coronary artery is normal.  The left anterior descending artery is a large vessel which wraps around the apex.  It has mild wall irregularities of less than 10%.  The first diagonal is also a large branch which has no significant disease.  The left circumflex coronary artery gives rise to a very large first obtuse marginal vessel.  There is 30% irregularity in the proximal vessel.  The ongoing left circumflex has an 80-90% stenosis in the mid-to-distal vessel. There is a very tiny second obtuse marginal vessel which is subtotally occluded proximally before the distal third obtuse marginal vessel, which is somewhat small in size.  The right coronary artery arises and distributes normally.  There is severe shepherds crook deformity in the proximal vessel.  There is a 30% stenosis proximally.  FINAL INTERPRETATION 1. Severe dilated cardiomyopathy. 2. Single-vessel obstructive atherosclerotic coronary artery disease. 3. Normal right heart pressures.  SUMMARY:  This patient has a severe dilated cardiomyopathy which appears to be nonischemic.  His degree of atherosclerosis is not severe enough to account for his severe global hypokinesia.  He has single-vessel obstructive coronary disease involving the mid-to-distal left circumflex.  This lesion would be suitable for angioplasty if he has significant anginal symptoms.  DD:  10/09/99 TD:  10/10/99 Job: [id number]

## 2010-07-14 NOTE — H&P (Signed)
[place]  Patient:    [name], [name]                          MRN: [id number] Adm. Date:  10/06/99 Attending:  Amada Jupiter T. Dreiling, M.D.                         History and Physical  CHIEF COMPLAINT:  Wheezes, dyspnea and chest pressure.  HISTORY OF PRESENT ILLNESS:  A 55 year old black gentleman who developed wheezes and dyspnea approximately 10:00 p.m. this evening.  Shortly afterwards, he started having chest pressure and diaphoresis.  The chest pressure was across his chest anteriorly.  He went to the emergency room and was found to have pulmonary edema.  There was no nausea or prior episodes like this.  He has had several episodes for the last couple of months in which he had wheezes, and he rested and it resolved.  PAST SURGICAL HISTORY:  Right knee surgery.  PAST MEDICAL HISTORY:  Diabetes with admission in the past.  MEDICATIONS:  Insulin 70/30, 75 units q.a.m. and 30 units q.p.m.  Blood pressure medication that he is not sure of.  ALLERGIES:  No known drug allergies.  SOCIAL HISTORY:  He is divorced and has two children.  He does not smoke or drink.  He works in a [employer].  FAMILY HISTORY:  His mother deceased at the age of 52 of breast cancer.  She had diabetes, hypertension and glaucoma.  His father deceased at the age of 45 of MI.  He also had hypertension.  He has four sisters.  There is diabetes, hypertension, and ovarian cancer with them.  REVIEW OF SYSTEMS:  Noncontributory.  PHYSICAL EXAMINATION:  VITAL SIGNS:  Blood pressure initially 201/142, pulse 114, heart rate 36, temperature 96.3.  GENERAL:  A 55 year old black gentleman in no acute distress when I saw him.  HEENT:  Normocephalic and atraumatic.  Ears - ______ present.  Throat without exudate or erythema.  NECK:  Supple without lymphadenopathy or thyroid enlargement.  There was JVD, but it was difficult to tell because of his respirations and his  size.  CHEST:  Nontender.  HEART:  Regular rate and rhythm without murmurs, rubs, or gallops, although there was some tachycardia.  LUNGS:  Rales approximately 2/3 of the way up the lung fields.  BACK AND SPINE:  The spine, paraspinal and flank areas were nontender.  ABDOMEN:  Nontender.  I could not elicit a liver edge.  GENITALIA:  No hernias.  Normal findings in the scrotum.  RECTAL:  Deferred because of his medical problem.  EXTREMITIES:  No cyanosis or edema.  Dorsalis pedis and posterior tibialis showed +1 bilaterally.  NEUROLOGIC:  Cranial nerves II-XII were normal.  IMPRESSION: 1. Congestive heart failure, etiology ?. 2. Chest pressure, rule out myocardial infarction.  Plan serial    electrocardiograms and enzymes. 3. Diabetes on insulin and in poor control by his history. 4. Hypertension on an unknown blood pressure medication with questionable    control. 5. Overweight.  PLAN:  In the emergency room, he is given aspirin and IV nitroglycerin, which relieved his chest pressure, but also brought his pressure down significantly. IV Lasix was given and Dr. Peter [place] was consulted. DD:  10/06/99 TD:  10/06/99 Job: [id number]

## 2010-07-14 NOTE — H&P (Signed)
NAMECOLEBY, YETT                 ACCOUNT NO.:  [id number]   MEDICAL RECORD NO.:  [id number]          PATIENT TYPE:  INP   LOCATION:  5729                         FACILITY:  [facility]   PHYSICIAN:  Guy Franco, P.A.       DATE OF BIRTH:  14-Mar-1955   DATE OF ADMISSION:  01/31/2005  DATE OF DISCHARGE:                                HISTORY & PHYSICAL   CHIEF COMPLAINT:  Right upper quadrant pain.   Mr. Wotton is a 55 year old male with a several day history of right upper  quadrant pain.  He does have known gallstones.  He was seen in the emergency  room on January 29, 2005 and sent home with apparent plans to follow up in  our office for elective cholecystectomy.  He presented to our urgent clinic  on January 31, 2005 and he had gotten worse.  He was unable to keep  anything down.  He was dehydrated and in significant pain.  He was sent to  the emergency room for admission.   ALLERGIES:  IV DYE.   MEDICATIONS:  Diovan, Glucotrol, insulin, and Lipitor.   PAST MEDICAL HISTORY:  1.  Non-ischemic cardiomyopathy.  2.  Hypertension.  3.  Diabetes mellitus.  4.  History of right knee surgery.   SOCIAL HISTORY:  No tobacco or alcohol use.   FAMILY HISTORY:  Dad died at age 67 from myocardial infarction.  Mom died at  age 70 from breast cancer, diabetes mellitus, hypertension, and she had a  history of glaucoma.   REVIEW OF SYSTEMS:  No chest pain.  No shortness of breath.  Review of  systems as above, otherwise negative.   PHYSICAL EXAMINATION:  VITAL SIGNS:  Temperature 98.8, pulse 107,  respirations 18, blood pressure 148/83.  GENERAL:  He appears to be in mild distress.  HEENT:  Grossly normal.  No carotid or subclavian bruits.  No JVD.  Sclerae  clear.  Conjunctivae clear.  Nares without drainage.  CHEST:  Clear to auscultation bilaterally.  No wheeze or rhonchi.  HEART:  Regular rate and rhythm.  No gross murmur.  ABDOMEN:  Right upper quadrant tenderness.  Positive Murphy's  sign.  Involuntary guiding.  EXTREMITIES:  No peripheral edema.  SKIN:  Warm and dry.   LABORATORIES:  Normal white count.  His LFTs are pending.   IMPRESSION:  Acute cholecystitis.  Will place him on intravenous  antibiotics, keep him n.p.o. and because of his cardiomyopathy will need to  watch fluid overload in this patient perioperatively.  He has been seen and  examined by Dr. Claud Kelp and plans are for cholecystectomy this  afternoon.   cc:  __________      Guy Franco, P.A.     LB/MEDQ  D:  02/02/2005  T:  02/02/2005  Job:  [id number]   cc:   Dr. [place]   Haywood M. Derrell Lolling, M.D.  [address]  [place]  [place] [address]   Dr. Azucena Kuba

## 2010-07-14 NOTE — Cardiovascular Report (Signed)
[name], [name]                 ACCOUNT NO.:  [id number]   MEDICAL RECORD NO.:  [id number]          PATIENT TYPE:  INP   LOCATION:  6525                         FACILITY:  [facility]   PHYSICIAN:  Corky Crafts, MDDATE OF BIRTH:  1955-03-10   DATE OF PROCEDURE:  11/07/2005  DATE OF DISCHARGE:                              CARDIAC CATHETERIZATION   PRIMARY CARE PHYSICIAN:  Dr. Elias Else with [facility].   PROCEDURE PERFORMED:  Left heart catheterization, left ventriculogram,  abdominal aortogram, coronary angiogram and percutaneous intervention to the  right coronary artery.   OPERATOR:  Dr. Eldridge Dace.   INDICATIONS:  Unstable angina.   PROCEDURAL NARRATIVE:  The risks, benefits of cardiac catheterization were  explained to the patient and informed consent was obtained.  The patient was  brought to the cath lab.  He was placed on the table and prepped and draped  in the usual sterile fashion.  His right groin was infiltrated with 1%  lidocaine.  A 6-French arterial sheath was placed into his right femoral  artery using the modified Seldinger technique.  Left coronary artery  angiography was performed using a JL-4 catheter.  Catheter was advanced to  the ostium of the left main under fluoroscopic guidance.  Right coronary  artery angiography was then performed using a JR-4 catheter.  The catheter  was advanced to the left ostium under fluoroscopic guidance.  Left  ventriculography was then performed using a pigtail catheter.  The catheter  was advanced to the ascending aorta and across the aortic valve under  fluoroscopic guidance.  Catheter was then withdrawn under continuous  hemodynamic pressure monitoring for a pullback.  The catheter was then  withdrawn to the abdominal aorta at the level of the renal arteries.  Power  injection of contrast was used to perform both the ventriculogram as well as  the abdominal aortogram.  The percutaneous intervention was then  performed.  See below for details.  The sheath will be removed using manual compression  because the patient had a small femoral artery.   FINDINGS:  The left main is widely patent.  The circumflex is a medium-sized  vessel.  There is a 60-70% lesion just past the origin of the OM1.  There is  diffuse disease in the remainder of the distal AV groove  circumflex, is a  codominant vessel.  The obstruction is up to 70-80% in the distal  circumflex.  The left anterior descending is a large vessel with mild  luminal irregularities.  The first diagonal is a medium-sized vessel with  mild irregularities.  The right coronary artery is a medium size, diffusely  diseased codominant vessel.  There is a 80% midvessel stenosis and a 90%  distal vessel stenosis.  The bifurcation of the PDA and posterolateral  branch are diffusely diseased.  There is a large RV marginal branch which  appears to have become larger than usual to provide collaterals to the  distal right circulation.  The left ventriculogram showed normal left  ventricular function with an ejection fraction of 60%.   HEMODYNAMICS:  Left ventricular  pressure 179/14 with an LVEDP of 22 mmHg.  Aortic pressure of 172/73 with a mean aortic pressure of 111 mmHg.  The  aortogram reveals single renal arteries bilaterally.  There are ostial 25%  stenoses bilaterally.   PERCUTANEOUS CORONARY INTERVENTION NARRATIVE:  A JR-4 catheter with side  holes was advanced to the ascending aorta and used to engage the right  coronary artery.  Asahi Prowater wire was used to cross both lesions in the  right coronary artery.  A 2.0 x 8 mm Voyager balloon was then inflated to  the distal lesion and inflated to 10 atmospheres for 31 seconds; several  inflations were performed.  It was then withdrawn to the mid lesion and  inflated to 12 atmospheres for 32 seconds.  The balloon was withdrawn, and a  Cypher stent was then advanced to the distal lesion.  It could not  cross the  distal lesion; however, the size of the stent was suitable for the mid  lesion, so the mid lesion was then stented with a 2.5 x 13 mm Cypher stent  inflated to 18 atmospheres for 46 seconds.  The distal RCA lesion was then  treated with a 2.0 x 8 mm Voyager balloon, again to 18 atmospheres.  Then  the 2.5 x 13 mm Cypher stent was advanced to the distal RCA and deployed at  14 atmospheres for 46 seconds.  The mid RCA stent was postdilated with a  2.75 x 8 mm balloon to 14 atmospheres times 2 inflations.  There was an  excellent angiographic result with TIMI III flow.  There was still diffuse  disease throughout the vessel but both stented areas had no residual  stenosis.   IMPRESSION:  1. Normal left ventricular function.  This is a significant improvement      from his evaluation 6 years ago.  2. Significant 2-vessel disease with PCI to the right coronary artery with      two 2.5  x 13 mm Cypher stents.  3. Mild renal artery atherosclerosis.   RECOMMENDATIONS:  The patient will be maintained on Integrilin for 18 hours.  He will continue aspirin 325 mg and Plavix 75 mg daily indefinitely.  We  will watch him overnight.  If he has no problems, we will consider discharge  in the morning.  The patient will also need aggressive blood pressure  control.  He will follow up with myself as well as Dr. Carole Civil, MD  Electronically Signed     JSV/MEDQ  D:  11/08/2005  T:  11/09/2005  Job:  [id number]   cc:   Molly Maduro A. Nicholos Johns, M.D.

## 2010-07-14 NOTE — Op Note (Signed)
NAMEGABRIEN, MENTINK                 ACCOUNT NO.:  [id number]   MEDICAL RECORD NO.:  [id number]          PATIENT TYPE:  INP   LOCATION:  5729                         FACILITY:  [facility]   PHYSICIAN:  Wilmon Arms. Corliss Skains, M.D. DATE OF BIRTH:  02/05/1956   DATE OF PROCEDURE:  01/31/2005  DATE OF DISCHARGE:                                 OPERATIVE REPORT   PREOPERATIVE DIAGNOSIS:  Acute cholecystitis.   POSTOPERATIVE DIAGNOSIS:  Acute gangrenous cholecystitis.   PROCEDURE:  Laparoscopic cholecystectomy.   SURGEON:  Wilmon Arms. Corliss Skains, M.D.   ASSISTANT:  Thornton Park. Daphine Deutscher, M.D.   INDICATIONS FOR PROCEDURE:  The patient is a 55 year old male with an  extensive past medical history including insulin dependent diabetes and  cardiomyopathy and hypertension who presents with several days of severe  right upper quadrant pain, nausea and vomiting.  the patient was evaluated  in the emergency department where he was found to have gallstones.  However,  he was discharged home and was seen in the clinic today and was noted to be  significantly worse.  He was sent back to the emergency department where he  was admitted to the hospital with a diagnosis of acute cholecystitis.  His  preoperative liver function tests were abnormal with a bilirubin of 2.0.  He  was counseled regarding urgent laparoscopic cholecystectomy.   DESCRIPTION OF PROCEDURE:  Patient was brought to the operating room and  placed in supine position on the operating room table.  After an adequate  level of general anesthesia was obtained, a Foley catheter was placed under  sterile technique.  The patient's abdomen was shaved, prepped with Betadine  and draped in sterile fashion.  A transverse incision was made just below  his umbilicus.  Dissection was carried down through the subcutaneous fat to  the fascia.  The fascia was grasped with clamps and elevated.  The midline  fascia was opened vertically.  The peritoneal cavity was  bluntly entered.  A  stay suture of 0 Vicryl was placed in pursestring fashion.  The Hasson  cannula was inserted and secured with stay suture.  Pneumoperitoneum was  obtained by insufflating CO2 maintaining a maximum pressure of 15 mmHg.  The  patient was returned to a reverse Trendelenburg position, slightly rotated  to his left.  Visual inspection showed a normal-appearing liver and normal-  appearing stomach.  The gallbladder was not visible.  The omentum appeared  definitely adherent in the right upper quadrant.  A 10 mm port was placed in  the subxiphoid position and two 5 mm ports were placed in the right upper  quadrant.  We began bluntly dissecting the omentum away from the surface of  the gallbladder.  Some dark brown purulent material was immediately  encountered.  We were able to peel the omentum off the surface of the  gallbladder.  The gallbladder appeared very distended, tense and gangrenous.  A photo was taken.  The patient has a very large left lobe of the liver  which was flopping down in front of the gallbladder.  The gallbladder was  too distended to firmly grasp with a clamp so a clamp was used to push the  gallbladder superiorly.  A loop liver retractor was inserted through the mid  axillary port and was used to retract the left lobe of the liver laterally.  We began bluntly dissecting at what appeared to be the hilum of the  gallbladder.  Peritoneum was scored with cautery.  We bluntly dissected a  structure that appeared to be a cystic artery.  This was seen directly  entering the gallbladder and was behind a lymph node.  However, we were  unable to delineate the remainder of the biliary ductal anatomy.  We  therefore made the decision to proceed with a dome down dissection.  The  cautery was used to score the peritoneum at the fundus of the gallbladder.  We were able to enter the plane between the thickened peritoneum and the  surface of the gallbladder.  Endo  Kitners were used to bluntly dissect the  gallbladder away from the liver bed. We continued this blunt dissection as  well as occasional use of cautery to continue dissecting the gallbladder  down towards the hilum.  With extensive dissection, we were able to free up  the fundus of the gallbladder from the liver.  As we carried the dissection  down towards the hilum, several small arterial branches were encountered.  These were ligated with clips and divided.  The previously identified cystic  artery was also clearly seen entering the gallbladder, was ligated with  clips and divided. At this point, the only connection remaining was the  cystic duct.  This was circumferentially dissected.  It was noted to be  narrow. This was ligated with three clips and divided.  the gallbladder was  then free from the liver bed.  This was placed in the EndoCatch sac.  The  sac was pulled up through the umbilical fascia.  The fascial opening had to  be enlarged slightly to remove the gallbladder.  The gallbladder was filled  with hundreds of BB sized gallstones.  Once the gallbladder was successfully  removed, the pursestring suture was used to close the fascia.  We then  carefully inspected the liver bed and irrigated it thoroughly.  No bleeding  was noted.  No bile leak was noted.  The irrigant was suctioned out.  We  then removed the ports under direct vision and released the  pneumoperitoneum.  The port sites had all previously been infiltrated with  0.25% Marcaine.  A 4-0 Monocryl was used to close the skin in subcuticular  fashion.  Steri-Strips and clean dressings were applied.  The patient was  extubated and brought to the recovery room in stable condition.  All sponge,  needle and instrument counts were correct.      Wilmon Arms. Tsuei, M.D.  Electronically Signed     MKT/MEDQ  D:  02/01/2005  T:  02/01/2005  Job:  [id number]

## 2010-09-14 ENCOUNTER — Emergency Department (HOSPITAL_COMMUNITY): Payer: Medicare Other

## 2010-09-14 ENCOUNTER — Inpatient Hospital Stay (HOSPITAL_COMMUNITY)
Admission: EM | Admit: 2010-09-14 | Discharge: 2010-09-16 | DRG: 918 | Disposition: A | Discharge status: Home / Self Care | Payer: Medicare Other | Attending: Family Medicine | Admitting: Family Medicine

## 2010-09-14 DIAGNOSIS — Z9861 Coronary angioplasty status: Secondary | ICD-10-CM

## 2010-09-14 DIAGNOSIS — I739 Peripheral vascular disease, unspecified: Secondary | ICD-10-CM | POA: Diagnosis present

## 2010-09-14 DIAGNOSIS — T38801A Poisoning by unspecified hormones and synthetic substitutes, accidental (unintentional), initial encounter: Secondary | ICD-10-CM | POA: Diagnosis present

## 2010-09-14 DIAGNOSIS — T383X1A Poisoning by insulin and oral hypoglycemic [antidiabetic] drugs, accidental (unintentional), initial encounter: Principal | ICD-10-CM | POA: Diagnosis present

## 2010-09-14 DIAGNOSIS — E1169 Type 2 diabetes mellitus with other specified complication: Secondary | ICD-10-CM | POA: Diagnosis present

## 2010-09-14 DIAGNOSIS — E876 Hypokalemia: Secondary | ICD-10-CM | POA: Diagnosis present

## 2010-09-14 DIAGNOSIS — I2510 Atherosclerotic heart disease of native coronary artery without angina pectoris: Secondary | ICD-10-CM | POA: Diagnosis present

## 2010-09-14 DIAGNOSIS — I10 Essential (primary) hypertension: Secondary | ICD-10-CM | POA: Diagnosis present

## 2010-09-14 DIAGNOSIS — E785 Hyperlipidemia, unspecified: Secondary | ICD-10-CM | POA: Diagnosis present

## 2010-09-14 DIAGNOSIS — R748 Abnormal levels of other serum enzymes: Secondary | ICD-10-CM | POA: Diagnosis present

## 2010-09-14 DIAGNOSIS — R609 Edema, unspecified: Secondary | ICD-10-CM | POA: Diagnosis present

## 2010-09-14 LAB — CBC
Hemoglobin: 12.9 g/dL — ABNORMAL LOW (ref 13.0–17.0)
MCV: 85.7 fL (ref 78.0–100.0)
Platelets: 273 10*3/uL (ref 150–400)
RBC: 4.68 MIL/uL (ref 4.22–5.81)
WBC: 7.2 10*3/uL (ref 4.0–10.5)

## 2010-09-14 LAB — DIFFERENTIAL
Eosinophils Absolute: 0 10*3/uL (ref 0.0–0.7)
Lymphocytes Relative: 28 % (ref 12–46)
Lymphs Abs: 2.1 10*3/uL (ref 0.7–4.0)
Neutro Abs: 4.6 10*3/uL (ref 1.7–7.7)
Neutrophils Relative %: 63 % (ref 43–77)

## 2010-09-14 LAB — URINE MICROSCOPIC-ADD ON

## 2010-09-14 LAB — URINALYSIS, ROUTINE W REFLEX MICROSCOPIC
Glucose, UA: NEGATIVE mg/dL
Leukocytes, UA: NEGATIVE
pH: 5.5 (ref 5.0–8.0)

## 2010-09-14 LAB — COMPREHENSIVE METABOLIC PANEL
ALT: 17 U/L (ref 0–53)
AST: 22 U/L (ref 0–37)
CO2: 29 mEq/L (ref 19–32)
Chloride: 100 mEq/L (ref 96–112)
Creatinine, Ser: 1 mg/dL (ref 0.50–1.35)
GFR calc non Af Amer: >60 mL/min (ref >60)
Total Bilirubin: 0.3 mg/dL (ref 0.3–1.2)

## 2010-09-14 LAB — CK TOTAL AND CKMB (NOT AT ARMC): CK, MB: 5.9 ng/mL — ABNORMAL HIGH (ref 0.3–4.0)

## 2010-09-15 LAB — CARDIAC PANEL(CRET KIN+CKTOT+MB+TROPI)
CK, MB: 4.3 ng/mL — ABNORMAL HIGH (ref 0.3–4.0)
CK, MB: 3.6 ng/mL (ref 0.3–4.0)
Relative Index: 1.3 (ref 0.0–2.5)
Total CK: 284 U/L — ABNORMAL HIGH (ref 7–232)
Total CK: 279 U/L — ABNORMAL HIGH (ref 7–232)
Troponin I: <0.3 ng/mL (ref <0.30)
Troponin I: 0.31 ng/mL (ref <0.30)

## 2010-09-15 LAB — DIFFERENTIAL
Basophils Absolute: 0 10*3/uL (ref 0.0–0.1)
Basophils Relative: 0 % (ref 0–1)
Eosinophils Relative: 1 % (ref 0–5)
Lymphocytes Relative: 40 % (ref 12–46)
Monocytes Absolute: 0.6 10*3/uL (ref 0.1–1.0)
Monocytes Relative: 9 % (ref 3–12)

## 2010-09-15 LAB — CBC
HCT: 40.3 % (ref 39.0–52.0)
MCH: 27.7 pg (ref 26.0–34.0)
MCHC: 32.3 g/dL (ref 30.0–36.0)
RDW: 13.3 % (ref 11.5–15.5)

## 2010-09-15 LAB — COMPREHENSIVE METABOLIC PANEL
ALT: 14 U/L (ref 0–53)
Alkaline Phosphatase: 58 U/L (ref 39–117)
CO2: 30 mEq/L (ref 19–32)
GFR calc Af Amer: >60 mL/min (ref >60)
GFR calc non Af Amer: >60 mL/min (ref >60)
Glucose, Bld: 60 mg/dL — ABNORMAL LOW (ref 70–99)
Potassium: 3 mEq/L — ABNORMAL LOW (ref 3.5–5.1)
Sodium: 142 mEq/L (ref 135–145)

## 2010-09-15 LAB — URINE CULTURE: Culture  Setup Time: 201207200117

## 2010-09-15 LAB — HEMOGLOBIN A1C: Hgb A1c MFr Bld: 6.8 % — ABNORMAL HIGH (ref <5.7)

## 2010-09-15 LAB — GLUCOSE, CAPILLARY: Glucose-Capillary: 184 mg/dL — ABNORMAL HIGH (ref 70–99)

## 2010-09-15 NOTE — H&P (Signed)
[name], [name] NO.:  [id number]  MEDICAL RECORD NO.:  [id number]  LOCATION:  WLED                         FACILITY:  [facility]  PHYSICIAN:  Talmage Nap, MD  DATE OF BIRTH:  Oct 11, 1955  DATE OF ADMISSION:  09/14/2010 DATE OF DISCHARGE:                             HISTORY & PHYSICAL   PRIMARY CARE PHYSICIAN:  Molly Maduro A. Nicholos Johns, M.D.,  Family practice.  History obtainable from the patient.  CHIEF COMPLAINT:  Dizzy spell, unsteady gait and this occurred at about 5 p.m. on September 14, 2010.  HISTORY:  The patient is a 55 year old African American male with history of diabetes mellitus; congestive heart failure; and coronary artery disease, status post multiple cardiac cath plus 4 stents; presenting to the emergency room with dizzy spell and unsteady gait. The patient claimed that he had been in stable health until about early hours of this morning when he workup and he checked his blood sugar level and found that his blood sugar level was 200 mg percent.  He thereafter gave himself a test injection of a 70/30 insulin.  The patient normally takes about 52 units, but this time he took 60 units; however, at about 5 o'clock this evening, the patient was found to have unsteady gait.  He was feeling very dizzy and was very incoherent with his thoughts.  There was no history of chest pain.  There was no history of shortness of breath.  There was no diaphoresis, no nausea or vomiting.  The random blood sugar level done showed his blood capillary to be 40 mg percent and subsequently emergency medical service was called.  The patient was given glucagon on his way to the emergency room.  In the ED, he was still found to have a low blood sugar level about 52 mg percent and subsequently was given D50 and was started on  D10.  In the ED, the patient's mentation improved remarkably.  He denied any dizzy spells.  He denied any chest pain.  He denied any shortness of  breath.  He denied any systemic symptoms.  No fever and no chills.  No rigor and no diaphoresis.  After evaluating the patient, he was advised to be admitted to stabilize his blood sugar level.  PAST MEDICAL HISTORY:  Positive for, 1. Diabetes mellitus. 2. Coronary artery disease. 3. Hypertension. 4. Hyperlipidemia. 5. Peripheral vascular disease.  PAST SURGICAL HISTORY: 1. Coronary artery disease, status post multiple cardiac cath plus 4     stents. 2. Laparoscopic cholecystectomy. 3. Right knee arthroscopy with meniscal repair.  PREADMISSION MEDICATIONS: 1. Metformin 100 mg p.o. daily. 2. Clonidine 0.1 mg p.o. daily. 3. Simvastatin 20 mg p.o. daily. 4. Amlodipine 10 mg p.o. daily. 5. Losartan 100 mg p.o. daily. 6. Furosemide 80 mg p.o. daily. 7. Plavix 75 mg p.o. daily. 8. Metoprolol tartrate 25 mg p.o. daily. 9. Insulin 70/30,52 units subcut qam and 32 units qpm  ALLERGIES:  IV DYE.  SOCIAL HISTORY:  Negative for alcohol or tobacco use.  The patient is currently on disability.  FAMILY HISTORY:  Positive for coronary artery disease, congestive heart failure, and CVA.  REVIEW OF SYSTEMS:  The patient denies  any history of headaches.  No blurred vision.  No nausea or vomiting.  No fever or chills.  No rigor. Denies any chest pain or shortness of breath.  No cough or abdominal discomfort.  No diarrhea or hematochezia.  No dysuria or hematuria.  Has swelling of the lower extremities.  No intolerance to heat or cold and no neuropsychiatric disorder.  PHYSICAL EXAMINATION:  GENERAL:  A middle-aged man, obese, not in any respiratory distress. PRESENT VITAL SIGNS:  Blood pressure is 184/110, pulse is 85, respiratory rate 20, temperature is 98.3. HEENT:  Pupils are reactive to light and extraocular muscles are intact. NECK:  No jugular venous distention.  No carotid bruit.  No lymphadenopathy. CHEST:  Decreased breath sounds bibasilar secondary to body habitus. HEART:   Sounds are one and two. ABDOMEN:  Obese and nontender.  Liver, spleen, kidney not palpable. Bowel sounds are positive. EXTREMITIES:  +2 pedal edema up to mid tibia. NEUROPSYCHIATRIC:  Unremarkable. NEUROLOGIC:  Did not show any new lateralizing sign. SKIN:  Normal turgor.  LABORATORY DATA:  Chemistry done on the patient shows sodium of 139, potassium of 3.0, chloride of 100, bicarbonate is 29, glucose is 54, BUN is 12, creatinine is 1.0.  LFTs normal.  Total protein is 9.0.  First set of cardiac marker troponin-I less than 0.30.  Urinalysis unremarkable.  Lipase is 12.  Hematological indices showed WBC count of 7.2, hemoglobin of 12.9, hematocrit of 40.1, MCV of 85.7 with a platelet count of 273.  IMAGING STUDIES:  Chest x-ray, which showed no infiltrates.  CT of the brain without contrast showed paranasal sinus air disease, otherwise unremarkable.  EKG showed normal sinus rhythm with a rate of 82 and LVH, no ST abnormalities seen.  IMPRESSION:  Persistent hypoglycemia questionable secondary to excess insulin injection to rule out insulinoma.  Other medical problems include: 2. Coronary artery disease, status post multiple cardiac cath plus 4     stents. 3. Congestive heart failure (not otherwise specified). 4. Hypertension (BP uncontrolled). 5. Hyperlipidemia. 6. Peripheral vascular disease. 7. Hypokalemia  PLAN:  Plan is to admit the patient to general medical floor.  Since the patient's mentation is very much improved and blood sugar level is trending up.  The patient will be saline locked.  He will be on Accu- Cheks q.4 hourly.  His blood pressure will be controlled with losartan 100 mg p.o. daily, clonidine 0.1 mg p.o. b.i.d., and hydralazine 50 mg p.o. b.i.d.  He will also be on amlodipine 10 mg p.o. daily and Lopressor 25 mg p.o. b.i.d.  In addition, the patient will be on Lasix 40 mg IV q.12 h., Plavix 75 mg p.o. daily, and Zocor 20 mg p.o. daily. Since he is found to  be hypokalemic, the patient will be on KCl 20 mEq p.o. b.i.d.  GI prophylaxis will be done on Protonix 40 mg p.o. daily and Lovenox 40 mg subcu q.24 h. for DVT prophylaxis.  Further workup to be done on this patient will include hemoglobin A1c , serum proinsulin level and a C-peptide level.  Cardiac enzymes q.6 h. x3 will be ordered. CBC, CMP, and magnesium will be repeated in a.m.  The patient will be followed and evaluated on daily basis.     Talmage Nap, MD     CN/MEDQ  D:  09/15/2010  T:  09/15/2010  Job:  [id number]  Electronically Signed by Talmage Nap  on 09/15/2010 04:11:31 AM

## 2010-09-16 LAB — CARDIAC PANEL(CRET KIN+CKTOT+MB+TROPI)
CK, MB: 3.5 ng/mL (ref 0.3–4.0)
Total CK: 268 U/L — ABNORMAL HIGH (ref 7–232)
Total CK: 282 U/L — ABNORMAL HIGH (ref 7–232)
Troponin I: <0.3 ng/mL (ref <0.30)

## 2010-09-16 LAB — BASIC METABOLIC PANEL
BUN: 10 mg/dL (ref 6–23)
CO2: 29 mEq/L (ref 19–32)
Chloride: 104 mEq/L (ref 96–112)
Glucose, Bld: 162 mg/dL — ABNORMAL HIGH (ref 70–99)
Potassium: 3.7 mEq/L (ref 3.5–5.1)
Sodium: 139 mEq/L (ref 135–145)

## 2010-09-16 LAB — GLUCOSE, CAPILLARY
Glucose-Capillary: 147 mg/dL — ABNORMAL HIGH (ref 70–99)
Glucose-Capillary: 171 mg/dL — ABNORMAL HIGH (ref 70–99)

## 2010-09-21 NOTE — Discharge Summary (Signed)
NAMECHARLE, CLEAR                 ACCOUNT NO.:  [id number]  MEDICAL RECORD NO.:  [id number]  LOCATION:  1339                         FACILITY:  [facility]  PHYSICIAN:  Erick Blinks, MD     DATE OF BIRTH:  06-19-1955  DATE OF ADMISSION:  09/14/2010 DATE OF DISCHARGE:  09/16/2010                              DISCHARGE SUMMARY   PRIMARY CARE PHYSICIAN:  Molly Maduro A. Nicholos Johns, MD  DISCHARGE DIAGNOSES: 1. Hypoglycemia secondary to over medication with insulin. 2. Insulin-dependent diabetes. 3. History of coronary artery disease. 4. Hypertension, uncontrolled. 5. Pedal edema, likely secondary to diastolic congestive heart     failure, improved. 6. Hypokalemia. 7. Peripheral vascular disease. 8. Hyperlipidemia.  DISCHARGE MEDICATIONS: 1. Hydralazine 50 mg p.o. t.i.d. 2. Aspirin 81 mg p.o. daily. 3. Potassium chloride 20 mEq p.o. b.i.d. 4. Simvastatin 20 mg p.o. daily. 5. Lasix 80 mg p.o. b.i.d. 6. Clonidine 0.1 mg p.o. b.i.d. 7. Amlodipine 10 mg p.o. daily. 8. Losartan 100 mg p.o. daily. 9. Plavix 75 mg p.o. daily. 10.Metoprolol 25 mg p.o. b.i.d. 11.Metformin 1000 mg p.o. b.i.d. 12.NovoLog 70/30, 52 units in the morning and 32 units in the evening.  ADMISSION HISTORY:  This is a 55 year old African-American gentleman with history of coronary artery disease and insulin-dependent diabetes who presented to the emergency room with dizzy spell and unsteady gait. The patient had taken an increased dose of insulin when he found his blood sugar to be 200 mg/dL.  After taking the increased dose of insulin, he noted to feel very dizzy, was incoherent.  He denied any chest pain, shortness of breath.  No diaphoresis, nausea or vomiting. Blood sugar was checked and was found be 40.  Emergency medical services were called and the patient was brought to the emergency room.  He was given D50 when his repeat blood sugar was found to be 52.  Subsequently admitted for further treatment.  For details,  please refer to the history and physical per Dr. Beverly Gust on September 15, 2010.  HOSPITAL COURSE: 1. Hypoglycemia.  The patient was placed on sensitive sliding scale     insulin without any basal insulin.  His blood sugars have     normalized and in fact are starting to trend up now, the last one     being in 190s.  An A1c was checked and was found to be at 6.8.  C-     peptide level was also checked and was found to be low at 11.2.     The patient reports not having any symptomatic hypoglycemia on his     previous dose of insulin.  It seems that his blood sugars are     adequately controlled wiht an A1c of 6.8.  We will discharge him     home on the same dose of his home insulin.  We have asked that he     takes half the dose tonight since his blood sugars are on lower     side and tomorrow he may resume his regular dose of his insulin and     he can follow up for further titration with his primary care     physician.  2. Mild elevation in troponin.  The patient did have mild elevation in     troponin to 0.31.  The remainder of his enzymes x4 have been     negative.  This is likely secondary to some demand ischemia from     his hypoglycemia.  The patient will be continued on his regular     medications and he can follow up with Dr. Eldridge Dace for further     treatment as felt appropriate.  He does not have any symptoms. 3. Hypertension.  Hydralazine was added to the patient's regimen.     Again this can be further adjusted by his cardiologist/primary care     physician.  DIAGNOSTIC IMAGING: 1. Chest x-ray on admission shows no infiltrate, congestive heart     failure or pneumothorax. 2. CT head without contrast on September 14, 2010, shows small infarct in     left paracentral pontine region, new from the prior exam, although     appeared remote.  Prominent small vessel disease type changes.  No     CT evidence of large acute infarct.  Small acute infarct cannot be     excluded by CT.  No  intracranial hemorrhage.  Paranasal sinuses,     mastoid air cells and middle ear cavities are clear.  DISCHARGE INSTRUCTIONS:  The patient should continue on a heart-healthy, low-calorie diet; conduct his activity as tolerated.  He will need to follow up with Dr. Nicholos Johns in the next 1 week and can follow up with Dr. Eldridge Dace as previously scheduled.  Plan was discussed with the patient as well as his family who are in agreement.     Erick Blinks, MD     JM/MEDQ  D:  09/16/2010  T:  09/17/2010  Job:  [id number]  cc:   Molly Maduro A. Nicholos Johns, M.D. Fax: [phone]  Electronically Signed by Erick Blinks  on 09/21/2010 01:19:49 AM

## 2010-09-22 LAB — PROINSULIN/INSULIN RATIO: Insulin: 38 u[IU]/mL — ABNORMAL HIGH (ref 3–19)

## 2011-03-26 DIAGNOSIS — H2510 Age-related nuclear cataract, unspecified eye: Secondary | ICD-10-CM | POA: Diagnosis not present

## 2011-03-26 DIAGNOSIS — H269 Unspecified cataract: Secondary | ICD-10-CM | POA: Diagnosis not present

## 2011-03-27 DIAGNOSIS — H2510 Age-related nuclear cataract, unspecified eye: Secondary | ICD-10-CM | POA: Diagnosis not present

## 2011-04-10 DIAGNOSIS — H2510 Age-related nuclear cataract, unspecified eye: Secondary | ICD-10-CM | POA: Diagnosis not present

## 2011-04-16 DIAGNOSIS — H269 Unspecified cataract: Secondary | ICD-10-CM | POA: Diagnosis not present

## 2011-04-16 DIAGNOSIS — H2510 Age-related nuclear cataract, unspecified eye: Secondary | ICD-10-CM | POA: Diagnosis not present

## 2011-05-14 DIAGNOSIS — E1165 Type 2 diabetes mellitus with hyperglycemia: Secondary | ICD-10-CM | POA: Diagnosis not present

## 2011-05-14 DIAGNOSIS — H3340 Traction detachment of retina, unspecified eye: Secondary | ICD-10-CM | POA: Diagnosis not present

## 2011-05-14 DIAGNOSIS — H211X9 Other vascular disorders of iris and ciliary body, unspecified eye: Secondary | ICD-10-CM | POA: Diagnosis not present

## 2011-05-14 DIAGNOSIS — E11359 Type 2 diabetes mellitus with proliferative diabetic retinopathy without macular edema: Secondary | ICD-10-CM | POA: Diagnosis not present

## 2011-06-12 DIAGNOSIS — I2510 Atherosclerotic heart disease of native coronary artery without angina pectoris: Secondary | ICD-10-CM | POA: Diagnosis not present

## 2011-06-12 DIAGNOSIS — E782 Mixed hyperlipidemia: Secondary | ICD-10-CM | POA: Diagnosis not present

## 2011-06-12 DIAGNOSIS — I10 Essential (primary) hypertension: Secondary | ICD-10-CM | POA: Diagnosis not present

## 2011-06-12 DIAGNOSIS — E669 Obesity, unspecified: Secondary | ICD-10-CM | POA: Diagnosis not present

## 2011-08-07 DIAGNOSIS — E1165 Type 2 diabetes mellitus with hyperglycemia: Secondary | ICD-10-CM | POA: Diagnosis not present

## 2011-08-07 DIAGNOSIS — H35349 Macular cyst, hole, or pseudohole, unspecified eye: Secondary | ICD-10-CM | POA: Diagnosis not present

## 2011-08-07 DIAGNOSIS — H3340 Traction detachment of retina, unspecified eye: Secondary | ICD-10-CM | POA: Diagnosis not present

## 2011-08-07 DIAGNOSIS — E11359 Type 2 diabetes mellitus with proliferative diabetic retinopathy without macular edema: Secondary | ICD-10-CM | POA: Diagnosis not present

## 2011-10-08 DIAGNOSIS — E11359 Type 2 diabetes mellitus with proliferative diabetic retinopathy without macular edema: Secondary | ICD-10-CM | POA: Diagnosis not present

## 2011-10-08 DIAGNOSIS — E1139 Type 2 diabetes mellitus with other diabetic ophthalmic complication: Secondary | ICD-10-CM | POA: Diagnosis not present

## 2011-10-08 DIAGNOSIS — E1165 Type 2 diabetes mellitus with hyperglycemia: Secondary | ICD-10-CM | POA: Diagnosis not present

## 2011-10-08 DIAGNOSIS — H35349 Macular cyst, hole, or pseudohole, unspecified eye: Secondary | ICD-10-CM | POA: Diagnosis not present

## 2011-10-08 DIAGNOSIS — E11329 Type 2 diabetes mellitus with mild nonproliferative diabetic retinopathy without macular edema: Secondary | ICD-10-CM | POA: Diagnosis not present

## 2011-10-31 DIAGNOSIS — I10 Essential (primary) hypertension: Secondary | ICD-10-CM | POA: Diagnosis not present

## 2011-10-31 DIAGNOSIS — E119 Type 2 diabetes mellitus without complications: Secondary | ICD-10-CM | POA: Diagnosis not present

## 2011-10-31 DIAGNOSIS — E785 Hyperlipidemia, unspecified: Secondary | ICD-10-CM | POA: Diagnosis not present

## 2011-11-21 DIAGNOSIS — Z23 Encounter for immunization: Secondary | ICD-10-CM | POA: Diagnosis not present

## 2011-12-24 DIAGNOSIS — E669 Obesity, unspecified: Secondary | ICD-10-CM | POA: Diagnosis not present

## 2011-12-24 DIAGNOSIS — E782 Mixed hyperlipidemia: Secondary | ICD-10-CM | POA: Diagnosis not present

## 2011-12-24 DIAGNOSIS — I2510 Atherosclerotic heart disease of native coronary artery without angina pectoris: Secondary | ICD-10-CM | POA: Diagnosis not present

## 2011-12-24 DIAGNOSIS — I10 Essential (primary) hypertension: Secondary | ICD-10-CM | POA: Diagnosis not present

## 2012-01-07 DIAGNOSIS — H3340 Traction detachment of retina, unspecified eye: Secondary | ICD-10-CM | POA: Diagnosis not present

## 2012-01-07 DIAGNOSIS — H35349 Macular cyst, hole, or pseudohole, unspecified eye: Secondary | ICD-10-CM | POA: Diagnosis not present

## 2012-01-07 DIAGNOSIS — E11359 Type 2 diabetes mellitus with proliferative diabetic retinopathy without macular edema: Secondary | ICD-10-CM | POA: Diagnosis not present

## 2012-01-07 DIAGNOSIS — E1165 Type 2 diabetes mellitus with hyperglycemia: Secondary | ICD-10-CM | POA: Diagnosis not present

## 2012-05-05 DIAGNOSIS — H35349 Macular cyst, hole, or pseudohole, unspecified eye: Secondary | ICD-10-CM | POA: Diagnosis not present

## 2012-05-05 DIAGNOSIS — E1139 Type 2 diabetes mellitus with other diabetic ophthalmic complication: Secondary | ICD-10-CM | POA: Diagnosis not present

## 2012-05-05 DIAGNOSIS — H3340 Traction detachment of retina, unspecified eye: Secondary | ICD-10-CM | POA: Diagnosis not present

## 2012-05-05 DIAGNOSIS — E11359 Type 2 diabetes mellitus with proliferative diabetic retinopathy without macular edema: Secondary | ICD-10-CM | POA: Diagnosis not present

## 2012-06-05 DIAGNOSIS — I2510 Atherosclerotic heart disease of native coronary artery without angina pectoris: Secondary | ICD-10-CM | POA: Diagnosis not present

## 2012-06-19 ENCOUNTER — Emergency Department (HOSPITAL_COMMUNITY)
Admission: EM | Admit: 2012-06-19 | Discharge: 2012-06-19 | Disposition: A | Discharge status: Home / Self Care | Payer: Medicare Other | Source: Home / Self Care | Attending: Emergency Medicine | Admitting: Emergency Medicine

## 2012-06-19 ENCOUNTER — Encounter (HOSPITAL_COMMUNITY): Payer: Self-pay | Admitting: Emergency Medicine

## 2012-06-19 DIAGNOSIS — B86 Scabies: Secondary | ICD-10-CM

## 2012-06-19 HISTORY — DX: Essential (primary) hypertension: I10

## 2012-06-19 HISTORY — DX: Type 2 diabetes mellitus without complications: E11.9

## 2012-06-19 HISTORY — DX: Blindness, one eye, unspecified eye: H54.40

## 2012-06-19 MED ORDER — HYDROXYZINE HCL 25 MG PO TABS: 25.0000 mg | ORAL_TABLET | Freq: Four times a day (QID) | ORAL | Status: DC

## 2012-06-19 MED ORDER — PERMETHRIN 5 % EX CREA: TOPICAL_CREAM | CUTANEOUS | Status: DC

## 2012-06-19 NOTE — ED Notes (Signed)
Pt c/o rash onset Monday States grandson dx w/scabies Rash on abd, chest, arms, webs of fingers Denies: f/v/n/d Daughter put on cream he does not no the name of w/little relief  He is alert and oriented w/no signs of acute distress.

## 2012-06-19 NOTE — ED Provider Notes (Signed)
Chief Complaint:   Chief Complaint  Patient presents with  . Rash    History of Present Illness:   [name] is a 57 year old male who has had a four-day history of a pruritic rash on his hands, arms, and trunk and a grandson has scabies. He denies any rash elsewhere. He's had no difficulty breathing, wheezing, or swelling of his lips, tongue, or throat. No fever, chills, headache, or URI symptoms. No exposure to any known antigens. There are 4 other people live in his household.  Review of Systems:  Other than noted above, the patient denies any of the following symptoms: Systemic:  No fever, chills, sweats, weight loss, or fatigue. ENT:  No nasal congestion, rhinorrhea, sore throat, swelling of lips, tongue or throat. Resp:  No cough, wheezing, or shortness of breath. Skin:  No rash, itching, nodules, or suspicious lesions.  PMFSH:  Past medical history, family history, social history, meds, and allergies were reviewed.   Physical Exam:   Vital signs:  BP 186/85  Pulse 84  Temp(Src) 99 F (37.2 C) (Oral)  Resp 16  SpO2 100% Gen:  Alert, oriented, in no distress. ENT:  Pharynx clear, no intraoral lesions, moist mucous membranes. Lungs:  Clear to auscultation. Skin:  He has scattered, eczematous papules on the webspace of the fingers, wrists, arms, and trunk.  Assessment:  The encounter diagnosis was Scabies.  Plan:   1.  The following meds were prescribed:   Discharge Medication List as of 06/19/2012  1:00 PM    START taking these medications   Details  hydrOXYzine (ATARAX/VISTARIL) 25 MG tablet Take 1 tablet (25 mg total) by mouth every 6 (six) hours., Starting 06/19/2012, Until Discontinued, Normal    permethrin (ELIMITE) 5 % cream Apply head to toe at bedtime.  Leave on for 8 hours.  Scrub off next morning.  Repeat procedure in 1 week., Normal       2.  The patient was instructed in symptomatic care and handouts were given. 3.  The patient was told to return if  becoming worse in any way, if no better in 3 or 4 days, and given some red flag symptoms such as difficulty breathing or worsening rash that would indicate earlier return.     Reuben Likes, MD 06/19/12 [phone]

## 2012-09-01 DIAGNOSIS — E1139 Type 2 diabetes mellitus with other diabetic ophthalmic complication: Secondary | ICD-10-CM | POA: Diagnosis not present

## 2012-09-01 DIAGNOSIS — H3340 Traction detachment of retina, unspecified eye: Secondary | ICD-10-CM | POA: Diagnosis not present

## 2012-09-01 DIAGNOSIS — E11359 Type 2 diabetes mellitus with proliferative diabetic retinopathy without macular edema: Secondary | ICD-10-CM | POA: Diagnosis not present

## 2012-09-01 DIAGNOSIS — E1165 Type 2 diabetes mellitus with hyperglycemia: Secondary | ICD-10-CM | POA: Diagnosis not present

## 2012-10-01 DIAGNOSIS — E785 Hyperlipidemia, unspecified: Secondary | ICD-10-CM | POA: Diagnosis not present

## 2012-10-01 DIAGNOSIS — E1165 Type 2 diabetes mellitus with hyperglycemia: Secondary | ICD-10-CM | POA: Diagnosis not present

## 2012-10-01 DIAGNOSIS — E1139 Type 2 diabetes mellitus with other diabetic ophthalmic complication: Secondary | ICD-10-CM | POA: Diagnosis not present

## 2012-10-01 DIAGNOSIS — I10 Essential (primary) hypertension: Secondary | ICD-10-CM | POA: Diagnosis not present

## 2012-10-01 DIAGNOSIS — Z23 Encounter for immunization: Secondary | ICD-10-CM | POA: Diagnosis not present

## 2012-10-01 DIAGNOSIS — R609 Edema, unspecified: Secondary | ICD-10-CM | POA: Diagnosis not present

## 2012-10-12 ENCOUNTER — Emergency Department (HOSPITAL_COMMUNITY)
Admission: EM | Admit: 2012-10-12 | Discharge: 2012-10-13 | Disposition: A | Discharge status: Home / Self Care | Payer: Medicare Other | Attending: Emergency Medicine | Admitting: Emergency Medicine

## 2012-10-12 ENCOUNTER — Encounter (HOSPITAL_COMMUNITY): Payer: Self-pay | Admitting: *Deleted

## 2012-10-12 ENCOUNTER — Emergency Department (HOSPITAL_COMMUNITY): Payer: Medicare Other

## 2012-10-12 DIAGNOSIS — R21 Rash and other nonspecific skin eruption: Secondary | ICD-10-CM | POA: Insufficient documentation

## 2012-10-12 DIAGNOSIS — Z8679 Personal history of other diseases of the circulatory system: Secondary | ICD-10-CM | POA: Diagnosis not present

## 2012-10-12 DIAGNOSIS — M25461 Effusion, right knee: Secondary | ICD-10-CM

## 2012-10-12 DIAGNOSIS — I10 Essential (primary) hypertension: Secondary | ICD-10-CM | POA: Diagnosis not present

## 2012-10-12 DIAGNOSIS — M25469 Effusion, unspecified knee: Secondary | ICD-10-CM | POA: Diagnosis not present

## 2012-10-12 DIAGNOSIS — M109 Gout, unspecified: Secondary | ICD-10-CM | POA: Diagnosis not present

## 2012-10-12 DIAGNOSIS — E119 Type 2 diabetes mellitus without complications: Secondary | ICD-10-CM | POA: Diagnosis not present

## 2012-10-12 DIAGNOSIS — H5440 Blindness, one eye, unspecified eye: Secondary | ICD-10-CM | POA: Diagnosis not present

## 2012-10-12 DIAGNOSIS — L299 Pruritus, unspecified: Secondary | ICD-10-CM | POA: Diagnosis not present

## 2012-10-12 DIAGNOSIS — M25579 Pain in unspecified ankle and joints of unspecified foot: Secondary | ICD-10-CM | POA: Insufficient documentation

## 2012-10-12 DIAGNOSIS — I509 Heart failure, unspecified: Secondary | ICD-10-CM | POA: Insufficient documentation

## 2012-10-12 DIAGNOSIS — M1710 Unilateral primary osteoarthritis, unspecified knee: Secondary | ICD-10-CM | POA: Diagnosis not present

## 2012-10-12 LAB — BASIC METABOLIC PANEL
BUN: 16 mg/dL (ref 6–23)
Chloride: 99 mEq/L (ref 96–112)
Creatinine, Ser: 1.13 mg/dL (ref 0.50–1.35)
GFR calc Af Amer: 82 mL/min — ABNORMAL LOW (ref >90)
Glucose, Bld: 244 mg/dL — ABNORMAL HIGH (ref 70–99)
Potassium: 3.9 mEq/L (ref 3.5–5.1)

## 2012-10-12 LAB — CBC WITH DIFFERENTIAL/PLATELET
Basophils Relative: 0 % (ref 0–1)
Eosinophils Absolute: 0.1 10*3/uL (ref 0.0–0.7)
HCT: 39 % (ref 39.0–52.0)
Hemoglobin: 12.5 g/dL — ABNORMAL LOW (ref 13.0–17.0)
Lymphs Abs: 2.2 10*3/uL (ref 0.7–4.0)
MCH: 27.1 pg (ref 26.0–34.0)
MCHC: 32.1 g/dL (ref 30.0–36.0)
Monocytes Absolute: 1.3 10*3/uL — ABNORMAL HIGH (ref 0.1–1.0)
Monocytes Relative: 13 % — ABNORMAL HIGH (ref 3–12)
Neutro Abs: 6.3 10*3/uL (ref 1.7–7.7)
Neutrophils Relative %: 64 % (ref 43–77)
RBC: 4.62 MIL/uL (ref 4.22–5.81)

## 2012-10-12 LAB — SYNOVIAL CELL COUNT + DIFF, W/ CRYSTALS: Eosinophils-Synovial: 1 % (ref 0–1)

## 2012-10-12 LAB — GRAM STAIN

## 2012-10-12 MED ORDER — OXYCODONE-ACETAMINOPHEN 5-325 MG PO TABS: 1.0000 | ORAL_TABLET | Freq: Four times a day (QID) | ORAL | Status: DC | PRN

## 2012-10-12 MED ORDER — INDOMETHACIN 25 MG PO CAPS: 25.0000 mg | ORAL_CAPSULE | Freq: Three times a day (TID) | ORAL | Status: DC | PRN

## 2012-10-12 MED ORDER — OXYCODONE-ACETAMINOPHEN 5-325 MG PO TABS
2.0000 | ORAL_TABLET | Freq: Once | ORAL | Status: AC
  Administered 2012-10-12: 2 via ORAL
  Filled 2012-10-12: qty 2

## 2012-10-12 NOTE — ED Provider Notes (Signed)
CSN: [id number]     Arrival date & time 10/12/12  2002 History     First MD Initiated Contact with Patient 10/12/12 2026     Chief Complaint  Patient presents with  . Leg Swelling   (Consider location/radiation/quality/duration/timing/severity/associated sxs/prior Treatment) HPI  57 year old insulin-dependent diabetes, hypertension, peripheral vascular disease, CHF presents complaining of right knee pain and left foot pain. Patient states for the past 2-3 days he has had progressive pain to his right knee. Pain is described as an achy sensation. Since yesterday he also has pain to left foot, described as a sharp sensation. States he has been walking with a limp and due to the pain he has been resting in bed. His daughter brought him here to the ED primarily due to his increase knee pain. She also notice skin changes to both of his arm which is new. Patient states he noticed skin changes for the past week. Report mild itchiness without any significant pain. No history of eczema. Denies any medication change, new soap or detergent, or new pets. Denies fever, chills, chest pain, shortness of breath, abdominal pain. Patient denies any new numbness or weakness. She denies any trauma, no prior history of gout  Past Medical History  Diagnosis Date  . Blind left eye   . Hypertension   . Diabetes mellitus without complication    Past Surgical History  Procedure Laterality Date  . Cholecystectomy     No family history on file. History  Substance Use Topics  . Smoking status: Never Smoker   . Smokeless tobacco: Not on file  . Alcohol Use: No    Review of Systems  Constitutional: Negative for fever.  Musculoskeletal: Positive for joint swelling.  Skin: Positive for rash.  All other systems reviewed and are negative.    Allergies  Iohexol and Ivp dye  Home Medications   Current Outpatient Rx  Name  Route  Sig  Dispense  Refill  . hydrOXYzine (ATARAX/VISTARIL) 25 MG tablet   Oral   Take 1 tablet (25 mg total) by mouth every 6 (six) hours.   20 tablet   0   . permethrin (ELIMITE) 5 % cream      Apply head to toe at bedtime.  Leave on for 8 hours.  Scrub off next morning.  Repeat procedure in 1 week.   300 g   1    BP 164/82  Pulse 84  Temp(Src) 100.4 F (38 C) (Oral)  Wt 272 lb (123.378 kg)  SpO2 96% Physical Exam  Nursing note and vitals reviewed. Constitutional: He is oriented to person, place, and time. He appears well-developed and well-nourished. No distress.  HENT:  Head: Normocephalic and atraumatic.  Mouth/Throat: Oropharynx is clear and moist.  Eyes: Conjunctivae are normal.  Neck: Neck supple.  Cardiovascular: Normal rate and regular rhythm.   Pulmonary/Chest: Effort normal and breath sounds normal. He has no rales.  Abdominal: Soft. There is no tenderness.  Musculoskeletal: He exhibits edema and tenderness (Right knee: Effusion noted throughout, mild warmth, and increasing pain with knee flexion and extension. Decreased range of motion due to pain. No rash noted. Well-healing surgical scar.  Left foot: Tenderness in the ball of foot without any deformity,).   2+ pitting edema to dorsum to both feet. Difficult to palpate DP and PT.  DP pulse palpable bilaterally using bedside ultrasound  Neurological: He is alert and oriented to person, place, and time.  Skin: Rash (patient has circular scaly rash in multiple placed  most significant to left forearm, l right forearm, right middle finger, left great toe. Nontender palpation, no pustular exudate.) noted.    ED Course   ARTHOCENTESIS Date/Time: 10/12/2012 10:07 PM Performed by: Fayrene Helper Authorized by: Fayrene Helper Consent: Verbal consent obtained. Risks and benefits: risks, benefits and alternatives were discussed Consent given by: patient Patient understanding: patient states understanding of the procedure being performed Patient consent: the patient's understanding of the procedure matches  consent given Procedure consent: procedure consent matches procedure scheduled Site marked: the operative site was marked Patient identity confirmed: verbally with patient and arm band Time out: Immediately prior to procedure a "time out" was called to verify the correct patient, procedure, equipment, support staff and site/side marked as required. Indications: joint swelling, possible septic joint, diagnostic evaluation and pain  Body area: knee Joint: right knee Local anesthesia used: yes Local anesthetic: lidocaine 2% without epinephrine Anesthetic total: 3 ml Preparation: Patient was prepped and draped in the usual sterile fashion. Needle gauge: 18 G Approach: lateral Aspirate: cloudy Aspirate amount: 20 ml Patient tolerance: Patient tolerated the procedure well with no immediate complications.   (including critical care time)  9:18 PM Patient here with right knee pain and swelling. He does have a mildly elevated at 100.4. Plan to tabs knee for diagnostic purposes to rule out septic arthritis. X-ray ordered. Patient also complaining of left foot pain. Tenderness to the sole of foot without any obvious finding. Pain is likely secondary to changing gait due to his right knee pain. Patient also has a scaly rash to the extremities. This is likely unrelated, will benefit from outpatient followup. He has bilateral pedal edema, likely chronic, currently taking Lasix. Lung exam without any significant rales to suggest pleural effusion.  Care discussed with attending.    10:05 PM R knee aspiration for diagnostic evaluation.  Pt agrees and consent for procedure.    11:53 PM Synovial fluid shows evidence suggestive of gout.  Does not reflect septic arthritis.  SInce pt has hx of diabetes, will give indomethacin and percocet for pain.  Pt to f/u with his PCP for further care.  Pt agrees with plan.    Labs Reviewed  CBC WITH DIFFERENTIAL - Abnormal; Notable for the following:    Hemoglobin 12.5  (*)    Monocytes Relative 13 (*)    Monocytes Absolute 1.3 (*)    All other components within normal limits  BASIC METABOLIC PANEL - Abnormal; Notable for the following:    Glucose, Bld 244 (*)    GFR calc non Af Amer 70 (*)    GFR calc Af Amer 82 (*)    All other components within normal limits  PRO B NATRIURETIC PEPTIDE - Abnormal; Notable for the following:    Pro B Natriuretic peptide (BNP) 386.7 (*)    All other components within normal limits  CELL COUNT + DIFF,  W/ CRYST-SYNVL FLD - Abnormal; Notable for the following:    Color, Synovial YELLOW (*)    Appearance-Synovial TURBID (*)    WBC, Synovial 16109 (*)    Neutrophil, Synovial 89 (*)    Monocyte-Macrophage-Synovial Fluid 10 (*)    All other components within normal limits  GRAM STAIN  BODY FLUID CULTURE   Dg Knee Complete 4 Views Right  10/12/2012   *RADIOLOGY REPORT*  Clinical Data: Swelling  RIGHT KNEE - COMPLETE 4+ VIEW  Comparison: None.  Findings: Frontal, lateral, and bilateral oblique views were obtained.  There is no fracture or dislocation.  There is a sizable joint effusion.  There is marked narrowing medially.  There is moderate narrowing in the patellofemoral joint region.  There is spurring in all compartments.  No bony destruction.  IMPRESSION: Extensive osteoarthritic change, most marked medially.  Sizable joint effusion.  No fracture.   Original Report Authenticated By: Bretta Bang, M.D.   1. Knee effusion, right   2. Gout     MDM  BP 164/82  Pulse 84  Temp(Src) 100.4 F (38 C) (Oral)  Wt 272 lb (123.378 kg)  SpO2 96%  I have reviewed nursing notes and vital signs. I personally reviewed the imaging tests through PACS system  I reviewed available ER/hospitalization records thought the EMR   Fayrene Helper, [place] 10/12/12 2355

## 2012-10-12 NOTE — ED Notes (Signed)
Pt presents with increased swelling in lower ext. Lower ext warm and swollen, can not palpate pulse.

## 2012-10-13 NOTE — ED Provider Notes (Signed)
Medical screening examination/treatment/procedure(s) were conducted as a shared visit with non-physician practitioner(s) and myself.  I personally evaluated the patient during the encounter.  Patient presents with right knee pain.  Nontoxic and afebrile.  Arthrocentesis consistent with gout.  Shon Baton, MD 10/13/12 [phone]

## 2012-10-16 DIAGNOSIS — M79609 Pain in unspecified limb: Secondary | ICD-10-CM | POA: Diagnosis not present

## 2012-10-23 DIAGNOSIS — M109 Gout, unspecified: Secondary | ICD-10-CM | POA: Diagnosis not present

## 2012-10-23 DIAGNOSIS — B354 Tinea corporis: Secondary | ICD-10-CM | POA: Diagnosis not present

## 2012-11-05 DIAGNOSIS — M109 Gout, unspecified: Secondary | ICD-10-CM | POA: Diagnosis not present

## 2012-11-24 DIAGNOSIS — Z1211 Encounter for screening for malignant neoplasm of colon: Secondary | ICD-10-CM | POA: Diagnosis not present

## 2012-11-24 DIAGNOSIS — M109 Gout, unspecified: Secondary | ICD-10-CM | POA: Diagnosis not present

## 2012-12-02 ENCOUNTER — Encounter: Payer: Self-pay | Admitting: Gastroenterology

## 2012-12-08 ENCOUNTER — Encounter: Payer: Self-pay | Admitting: Gastroenterology

## 2012-12-09 ENCOUNTER — Ambulatory Visit (INDEPENDENT_AMBULATORY_CARE_PROVIDER_SITE_OTHER): Payer: Medicare Other | Admitting: Gastroenterology

## 2012-12-09 ENCOUNTER — Encounter: Payer: Self-pay | Admitting: Gastroenterology

## 2012-12-09 VITALS — BP 160/76 | HR 74 | Ht 68.0 in | Wt 269.0 lb

## 2012-12-09 DIAGNOSIS — Z1211 Encounter for screening for malignant neoplasm of colon: Secondary | ICD-10-CM

## 2012-12-09 MED ORDER — MOVIPREP 100 G PO SOLR: 1.0000 | Freq: Once | ORAL | Status: DC

## 2012-12-09 NOTE — Progress Notes (Addendum)
12/09/2012 [name] [id number] Jun 17, 1955   HISTORY OF PRESENT ILLNESS:  Patient is here for screening colonoscopy.  No GI complaints and no family history of colon cancer to his knowledge.  Never had a colonoscopy in the past.  He is on Plavix for stents that were placed a few years ago, but says that he has not been taking it for several days because he needed to have refills verified.     Past Medical History  Diagnosis Date  . Blind left eye   . Hypertension   . Diabetes mellitus without complication   . Gout   . Herpes   . CHF (congestive heart failure)   . Cardiomyopathy   . Hypercholesterolemia   . MI (myocardial infarction)   . Pneumonia   . Adrenal insufficiency   . Peripheral neuropathy   . Carpal tunnel syndrome   . PVD (peripheral vascular disease)   . ED (erectile dysfunction)    Past Surgical History  Procedure Laterality Date  . Cholecystectomy    . Stents      reports that he has never smoked. He has never used smokeless tobacco. He reports that he does not drink alcohol or use illicit drugs. family history includes Breast cancer in his mother; Diabetes in his maternal aunt; Heart attack in his sister; Hypertension in his father. Allergies  Allergen Reactions  . Iohexol     hives  . Ivp Dye [Iodinated Diagnostic Agents]       Outpatient Encounter Prescriptions as of 12/09/2012  Medication Sig Dispense Refill  . atorvastatin (LIPITOR) 40 MG tablet Take 40 mg by mouth daily.      . cloNIDine (CATAPRES) 0.1 MG tablet Take 0.1 mg by mouth 2 (two) times daily.      . clopidogrel (PLAVIX) 75 MG tablet Take by mouth daily.      . furosemide (LASIX) 80 MG tablet Take 80 mg by mouth 2 (two) times daily.      . indomethacin (INDOCIN) 25 MG capsule Take 1 capsule (25 mg total) by mouth 3 (three) times daily as needed.  30 capsule  0  . insulin NPH-regular (NOVOLIN 70/30) (70-30) 100 UNIT/ML injection Inject into the skin. 52 units in the morning and 32 units  in the evening      . losartan-hydrochlorothiazide (HYZAAR) 100-12.5 MG per tablet Take 1 tablet by mouth daily.      . metFORMIN (GLUCOPHAGE) 1000 MG tablet Take 1,000 mg by mouth 2 (two) times daily with a meal.      . metoprolol tartrate (LOPRESSOR) 25 MG tablet Take 25 mg by mouth 2 (two) times daily.      Marland Kitchen oxyCODONE-acetaminophen (PERCOCET/ROXICET) 5-325 MG per tablet Take 1-2 tablets by mouth every 6 (six) hours as needed for pain.  20 tablet  0  . [DISCONTINUED] amLODipine (NORVASC) 10 MG tablet Take by mouth daily.       No facility-administered encounter medications on file as of 12/09/2012.     REVIEW OF SYSTEMS  : All other systems reviewed and negative except where noted in the History of Present Illness.   PHYSICAL EXAM: BP 160/76  Pulse 74  Ht 5\' 8"  (1.727 m)  Wt 122.018 kg (269 lb)  BMI 40.91 kg/m2 General: Well developed black male in no acute distress Head: Normocephalic and atraumatic Eyes:  Sclerae anicteric, conjunctiva pink. Ears: Normal auditory acuity Lungs: Clear throughout to auscultation; decreased breath sounds B/L Heart: Regular rate and rhythm.  Abdomen: Soft, non-tender,  non-distended. No masses or hepatomegaly noted. Normal bowel sounds. Rectal: Deferred.  Will be done at the time of colonoscopy. Musculoskeletal: Symmetrical. Skin: Has open sores and lesions on the dorsal sides of his hands. Extremities:  Significant pitting edema in B/L LE's with skin changes. Neurological: Alert oriented x 4, grossly nonfocal Psychological:  Alert and cooperative. Normal mood and affect  ASSESSMENT AND PLAN: -Screening colonoscopy:  Will schedule with Dr. Rhea Belton.  Patient says that he has not been taking his Plavix for several days so we contact his cardiologist to see if we can continue to hold it for a procedure next week.  The risks, benefits, and alternatives were discussed with the patient and he consent to proceed.     Addendum: Reviewed and agree with  initial management. Agree with colonoscopy with the assumption we have permission to hold plavix for 5 days before procedure. If consent not obtained, procedure will need to be delayed Beverley Fiedler, MD

## 2012-12-09 NOTE — Patient Instructions (Signed)
You have been scheduled for a colonoscopy with propofol. Please follow written instructions given to you at your visit today.  Please pick up your prep kit at the pharmacy within the next 1-3 days. [facility] [address] [facility]. If you use inhalers (even only as needed), please bring them with you on the day of your procedure.  We will notify [facility] to advise them of this procedure and that you are not currently taking the Plavix.

## 2012-12-15 ENCOUNTER — Ambulatory Visit (AMBULATORY_SURGERY_CENTER): Payer: Medicare Other | Admitting: Internal Medicine

## 2012-12-15 ENCOUNTER — Encounter: Payer: Self-pay | Admitting: Internal Medicine

## 2012-12-15 VITALS — BP 131/67 | HR 61 | Temp 96.9°F | Resp 37 | Ht 68.0 in | Wt 269.0 lb

## 2012-12-15 DIAGNOSIS — D126 Benign neoplasm of colon, unspecified: Secondary | ICD-10-CM

## 2012-12-15 DIAGNOSIS — I252 Old myocardial infarction: Secondary | ICD-10-CM | POA: Diagnosis not present

## 2012-12-15 DIAGNOSIS — Z1211 Encounter for screening for malignant neoplasm of colon: Secondary | ICD-10-CM | POA: Diagnosis not present

## 2012-12-15 DIAGNOSIS — E785 Hyperlipidemia, unspecified: Secondary | ICD-10-CM | POA: Diagnosis not present

## 2012-12-15 DIAGNOSIS — S41109A Unspecified open wound of unspecified upper arm, initial encounter: Secondary | ICD-10-CM | POA: Diagnosis not present

## 2012-12-15 DIAGNOSIS — I509 Heart failure, unspecified: Secondary | ICD-10-CM | POA: Diagnosis not present

## 2012-12-15 DIAGNOSIS — E119 Type 2 diabetes mellitus without complications: Secondary | ICD-10-CM | POA: Diagnosis not present

## 2012-12-15 LAB — GLUCOSE, CAPILLARY
Glucose-Capillary: 64 mg/dL — ABNORMAL LOW (ref 70–99)
Glucose-Capillary: 143 mg/dL — ABNORMAL HIGH (ref 70–99)
Glucose-Capillary: 112 mg/dL — ABNORMAL HIGH (ref 70–99)

## 2012-12-15 MED ORDER — DEXTROSE 5 % IV SOLN: INTRAVENOUS | Status: DC

## 2012-12-15 NOTE — Patient Instructions (Signed)

## 2012-12-15 NOTE — Progress Notes (Signed)
Pt received 150 cc D5W preop. Changed IVF to NS in procedure room - received 250 cc  To RR stable

## 2012-12-15 NOTE — Op Note (Signed)
[facility] [address] [place], [address]   COLONOSCOPY PROCEDURE REPORT  PATIENT: [name], [name]  MR#: [id number] BIRTHDATE: 17-Aug-1955 , 57  yrs. old GENDER: Male ENDOSCOPIST: Beverley Fiedler, MD REFERRED XB:[id number] Nicholos Johns, M.D. PROCEDURE DATE:  12/15/2012 PROCEDURE:   Colonoscopy with snare polypectomy First Screening Colonoscopy - Avg.  risk and is 50 yrs.  old or older Yes.  Prior Negative Screening - Now for repeat screening. N/A  History of Adenoma - Now for follow-up colonoscopy & has been > or = to 3 yrs.  N/A  Polyps Removed Today? Yes. ASA CLASS:   Class III INDICATIONS:average risk screening and first colonoscopy. MEDICATIONS: MAC sedation, administered by CRNA and propofol (Diprivan) 200mg  IV  DESCRIPTION OF PROCEDURE:   After the risks benefits and alternatives of the procedure were thoroughly explained, informed consent was obtained.  A digital rectal exam revealed no rectal mass.   The LB [id number]  endoscope was introduced through the anus and advanced to the cecum, which was identified by both the appendix and ileocecal valve. No adverse events experienced. The quality of the prep was good, using MoviPrep  The instrument was then slowly withdrawn as the colon was fully examined.   COLON FINDINGS: A semi-pedunculated polyp measuring 5 mm in size was found in the rectum.  A polypectomy was performed with a cold snare.  The resection was complete and the polyp tissue was completely retrieved.   Moderate diverticulosis was noted in the ascending colon and sigmoid colon.  Retroflexed views revealed no abnormalities. The time to cecum=4 minutes 19 seconds.  Withdrawal time=10 minutes 29 seconds.  The scope was withdrawn and the procedure completed. COMPLICATIONS: There were no complications.  ENDOSCOPIC IMPRESSION: 1.   Semi-pedunculated polyp measuring 5 mm in size was found in the rectum; polypectomy was performed with a cold snare 2.    Moderate diverticulosis was noted in the ascending colon and sigmoid colon  RECOMMENDATIONS: 1.  Await pathology results 2.  High fiber diet 3.  If the polyp removed today is proven to be an adenomatous (pre-cancerous) polyp, you will need a repeat colonoscopy in 5 years.  Otherwise you should continue to follow colorectal cancer screening guidelines for "routine risk" patients with colonoscopy in 10 years.  You will receive a letter within 1-2 weeks with the results of your biopsy as well as final recommendations.  Please call my office if you have not received a letter after 3 weeks.   eSigned:  Beverley Fiedler, MD 12/15/2012 3:25 PM cc: The Patient and Elias Else, MD

## 2012-12-15 NOTE — Progress Notes (Signed)
Called to room to assist during endoscopic procedure.  Patient ID and intended procedure confirmed with present staff. Received instructions for my participation in the procedure from the performing physician.  

## 2012-12-15 NOTE — Progress Notes (Signed)
Patient did not experience any of the following events: a burn prior to discharge; a fall within the facility; wrong site/side/patient/procedure/implant event; or a hospital transfer or hospital admission upon discharge from the facility. (G8907) Patient did not have preoperative order for IV antibiotic SSI prophylaxis. (G8918)  

## 2012-12-15 NOTE — Progress Notes (Signed)
Per the pt he picked up medications November 28, 2012.  He said his plavix was not in the meds and he has not taken it since 11-27-12.  I remined him this was very important to take plavix and he should contact his cardiologist ASAP about the plavix.  Pt is a poor historian.  Maw

## 2012-12-15 NOTE — Progress Notes (Signed)
Pt has a large broken skin sore to top of right hand larger than a silver dollar size and also one around the same size on posterior wrist and lower forearm.  Also broken skin noted around middle finer on right hand.  No ozzing noted, but broken skin in all three areas.  I place a loose sterile gauze to all three areas and applied with paper tape.  Per the pt his PCP, Dr. Elias Else is tx this skin problem, but the pt did not know what this was called. Maw  Robbin recheck the pt's sugar and it was 143.  D5w was slowed to [facility]. Maw

## 2012-12-16 ENCOUNTER — Telehealth: Payer: Self-pay | Admitting: *Deleted

## 2012-12-16 NOTE — Telephone Encounter (Signed)
Informed pt and his daughter about his appt on 12/23/12 at 11:30am with Dr Margo Aye. The ofc is located at [address], [address], [address] [id number]. Daughter reports the rash is on both arms and has spread to his eye and the eye is matted shut; she is very worried. She has tried to call Dr Nicholos Johns with no call back; I suggested she call the Dermatologist and maybe they can suggest something; she stated understanding.

## 2012-12-16 NOTE — Telephone Encounter (Signed)
Message left

## 2012-12-16 NOTE — Telephone Encounter (Signed)
Message copied by Florene Glen on Tue Dec 16, 2012  8:33 AM ------      Message from: Beverley Fiedler      Created: Mon Dec 15, 2012  3:31 PM       Dr. Nicholos Johns      Mr. Melchior was here today for his colonoscopy, which was uneventful.      He wanted me to let you know his rash has recurred on his right upper extremity.  He is requesting refill of topical therapy given previously, and for a derm referral.      I will place the derm referral per his request, but wanted to let you know about the recurrence of his rash.      Thank you      Vonna Kotyk Pyrtle      [facility]            Margretta Ditty referral, RUQ rash, psoriatic in appearance       ------

## 2012-12-22 ENCOUNTER — Encounter: Payer: Self-pay | Admitting: Internal Medicine

## 2012-12-29 ENCOUNTER — Encounter (HOSPITAL_COMMUNITY): Payer: Self-pay | Admitting: Emergency Medicine

## 2012-12-29 ENCOUNTER — Observation Stay (HOSPITAL_COMMUNITY): Payer: Medicare Other

## 2012-12-29 ENCOUNTER — Inpatient Hospital Stay (HOSPITAL_COMMUNITY)
Admission: EM | Admit: 2012-12-29 | Discharge: 2013-01-01 | DRG: 300 | Disposition: A | Discharge status: Home / Self Care | Payer: Medicare Other | Attending: Internal Medicine | Admitting: Internal Medicine

## 2012-12-29 DIAGNOSIS — Z6838 Body mass index (BMI) 38.0-38.9, adult: Secondary | ICD-10-CM | POA: Diagnosis not present

## 2012-12-29 DIAGNOSIS — I509 Heart failure, unspecified: Secondary | ICD-10-CM | POA: Diagnosis present

## 2012-12-29 DIAGNOSIS — I82629 Acute embolism and thrombosis of deep veins of unspecified upper extremity: Principal | ICD-10-CM | POA: Diagnosis present

## 2012-12-29 DIAGNOSIS — Z86718 Personal history of other venous thrombosis and embolism: Secondary | ICD-10-CM

## 2012-12-29 DIAGNOSIS — L0390 Cellulitis, unspecified: Secondary | ICD-10-CM

## 2012-12-29 DIAGNOSIS — M109 Gout, unspecified: Secondary | ICD-10-CM | POA: Diagnosis present

## 2012-12-29 DIAGNOSIS — I5032 Chronic diastolic (congestive) heart failure: Secondary | ICD-10-CM | POA: Diagnosis present

## 2012-12-29 DIAGNOSIS — L03119 Cellulitis of unspecified part of limb: Secondary | ICD-10-CM | POA: Diagnosis present

## 2012-12-29 DIAGNOSIS — L0291 Cutaneous abscess, unspecified: Secondary | ICD-10-CM | POA: Diagnosis not present

## 2012-12-29 DIAGNOSIS — IMO0001 Reserved for inherently not codable concepts without codable children: Secondary | ICD-10-CM | POA: Diagnosis present

## 2012-12-29 DIAGNOSIS — Z794 Long term (current) use of insulin: Secondary | ICD-10-CM

## 2012-12-29 DIAGNOSIS — E876 Hypokalemia: Secondary | ICD-10-CM | POA: Diagnosis present

## 2012-12-29 DIAGNOSIS — L299 Pruritus, unspecified: Secondary | ICD-10-CM | POA: Diagnosis present

## 2012-12-29 DIAGNOSIS — T502X5A Adverse effect of carbonic-anhydrase inhibitors, benzothiadiazides and other diuretics, initial encounter: Secondary | ICD-10-CM | POA: Diagnosis present

## 2012-12-29 DIAGNOSIS — IMO0002 Reserved for concepts with insufficient information to code with codable children: Secondary | ICD-10-CM | POA: Diagnosis present

## 2012-12-29 DIAGNOSIS — I252 Old myocardial infarction: Secondary | ICD-10-CM

## 2012-12-29 DIAGNOSIS — M7989 Other specified soft tissue disorders: Secondary | ICD-10-CM | POA: Diagnosis present

## 2012-12-29 DIAGNOSIS — G609 Hereditary and idiopathic neuropathy, unspecified: Secondary | ICD-10-CM | POA: Diagnosis present

## 2012-12-29 DIAGNOSIS — Z803 Family history of malignant neoplasm of breast: Secondary | ICD-10-CM

## 2012-12-29 DIAGNOSIS — M79609 Pain in unspecified limb: Secondary | ICD-10-CM | POA: Diagnosis not present

## 2012-12-29 DIAGNOSIS — I428 Other cardiomyopathies: Secondary | ICD-10-CM | POA: Diagnosis not present

## 2012-12-29 DIAGNOSIS — I059 Rheumatic mitral valve disease, unspecified: Secondary | ICD-10-CM | POA: Diagnosis not present

## 2012-12-29 DIAGNOSIS — H5440 Blindness, one eye, unspecified eye: Secondary | ICD-10-CM | POA: Diagnosis present

## 2012-12-29 DIAGNOSIS — R609 Edema, unspecified: Secondary | ICD-10-CM | POA: Diagnosis present

## 2012-12-29 DIAGNOSIS — I429 Cardiomyopathy, unspecified: Secondary | ICD-10-CM | POA: Diagnosis present

## 2012-12-29 DIAGNOSIS — R21 Rash and other nonspecific skin eruption: Secondary | ICD-10-CM | POA: Diagnosis present

## 2012-12-29 DIAGNOSIS — L408 Other psoriasis: Secondary | ICD-10-CM | POA: Diagnosis present

## 2012-12-29 DIAGNOSIS — Z8249 Family history of ischemic heart disease and other diseases of the circulatory system: Secondary | ICD-10-CM

## 2012-12-29 DIAGNOSIS — I10 Essential (primary) hypertension: Secondary | ICD-10-CM | POA: Diagnosis present

## 2012-12-29 DIAGNOSIS — Z833 Family history of diabetes mellitus: Secondary | ICD-10-CM | POA: Diagnosis not present

## 2012-12-29 DIAGNOSIS — E1165 Type 2 diabetes mellitus with hyperglycemia: Secondary | ICD-10-CM

## 2012-12-29 DIAGNOSIS — M19049 Primary osteoarthritis, unspecified hand: Secondary | ICD-10-CM | POA: Diagnosis not present

## 2012-12-29 DIAGNOSIS — G629 Polyneuropathy, unspecified: Secondary | ICD-10-CM | POA: Diagnosis present

## 2012-12-29 DIAGNOSIS — L02419 Cutaneous abscess of limb, unspecified: Secondary | ICD-10-CM | POA: Diagnosis present

## 2012-12-29 DIAGNOSIS — Z1211 Encounter for screening for malignant neoplasm of colon: Secondary | ICD-10-CM

## 2012-12-29 LAB — CBC
HCT: 39.5 % (ref 39.0–52.0)
Hemoglobin: 12.4 g/dL — ABNORMAL LOW (ref 13.0–17.0)
MCH: 27.6 pg (ref 26.0–34.0)
MCHC: 31.4 g/dL (ref 30.0–36.0)
MCV: 88 fL (ref 78.0–100.0)
Platelets: 327 10*3/uL (ref 150–400)
RBC: 4.49 MIL/uL (ref 4.22–5.81)
RDW: 14.5 % (ref 11.5–15.5)
WBC: 9.9 10*3/uL (ref 4.0–10.5)

## 2012-12-29 LAB — BASIC METABOLIC PANEL
BUN: 15 mg/dL (ref 6–23)
CO2: 29 mEq/L (ref 19–32)
Calcium: 9 mg/dL (ref 8.4–10.5)
Chloride: 106 mEq/L (ref 96–112)
Creatinine, Ser: 1.05 mg/dL (ref 0.50–1.35)
GFR calc Af Amer: 89 mL/min — ABNORMAL LOW (ref >90)
GFR calc non Af Amer: 77 mL/min — ABNORMAL LOW (ref >90)
Glucose, Bld: 94 mg/dL (ref 70–99)
Potassium: 4.2 mEq/L (ref 3.5–5.1)
Sodium: 144 mEq/L (ref 135–145)

## 2012-12-29 LAB — CG4 I-STAT (LACTIC ACID): Lactic Acid, Venous: 1.55 mmol/L (ref 0.5–2.2)

## 2012-12-29 LAB — GLUCOSE, CAPILLARY: Glucose-Capillary: 75 mg/dL (ref 70–99)

## 2012-12-29 MED ORDER — INDOMETHACIN 25 MG PO CAPS
25.0000 mg | ORAL_CAPSULE | Freq: Three times a day (TID) | ORAL | Status: DC | PRN
  Administered 2012-12-30 (×2): 25 mg via ORAL
  Filled 2012-12-29 (×2): qty 1

## 2012-12-29 MED ORDER — DIPHENHYDRAMINE HCL 25 MG PO CAPS
25.0000 mg | ORAL_CAPSULE | Freq: Four times a day (QID) | ORAL | Status: DC | PRN
  Administered 2012-12-29 – 2012-12-31 (×2): 25 mg via ORAL
  Filled 2012-12-29 (×4): qty 1

## 2012-12-29 MED ORDER — ONDANSETRON HCL 4 MG/2ML IJ SOLN: 4.0000 mg | Freq: Four times a day (QID) | INTRAMUSCULAR | Status: DC | PRN

## 2012-12-29 MED ORDER — HYDROXYZINE HCL 25 MG PO TABS
25.0000 mg | ORAL_TABLET | Freq: Three times a day (TID) | ORAL | Status: DC | PRN
  Administered 2012-12-29 – 2012-12-31 (×3): 25 mg via ORAL
  Filled 2012-12-29 (×2): qty 1

## 2012-12-29 MED ORDER — ASPIRIN 325 MG PO TABS
325.0000 mg | ORAL_TABLET | Freq: Every day | ORAL | Status: DC
  Administered 2012-12-30: 325 mg via ORAL
  Filled 2012-12-29: qty 1

## 2012-12-29 MED ORDER — VANCOMYCIN HCL 10 G IV SOLR
2000.0000 mg | Freq: Once | INTRAVENOUS | Status: AC
  Administered 2012-12-29: 2000 mg via INTRAVENOUS
  Filled 2012-12-29: qty 2000

## 2012-12-29 MED ORDER — ATORVASTATIN CALCIUM 40 MG PO TABS
40.0000 mg | ORAL_TABLET | Freq: Every day | ORAL | Status: DC
  Administered 2012-12-30 – 2013-01-01 (×3): 40 mg via ORAL
  Filled 2012-12-29 (×3): qty 1

## 2012-12-29 MED ORDER — SODIUM CHLORIDE 0.9 % IJ SOLN
3.0000 mL | Freq: Two times a day (BID) | INTRAMUSCULAR | Status: DC
  Administered 2012-12-31: 3 mL via INTRAVENOUS

## 2012-12-29 MED ORDER — ONDANSETRON HCL 4 MG PO TABS: 4.0000 mg | ORAL_TABLET | Freq: Four times a day (QID) | ORAL | Status: DC | PRN

## 2012-12-29 MED ORDER — INSULIN ASPART 100 UNIT/ML ~~LOC~~ SOLN
0.0000 [IU] | Freq: Three times a day (TID) | SUBCUTANEOUS | Status: DC
  Administered 2012-12-30 – 2012-12-31 (×2): 1 [IU] via SUBCUTANEOUS
  Administered 2012-12-31 – 2013-01-01 (×3): 2 [IU] via SUBCUTANEOUS

## 2012-12-29 MED ORDER — INSULIN ASPART PROT & ASPART (70-30 MIX) 100 UNIT/ML ~~LOC~~ SUSP
32.0000 [IU] | Freq: Every day | SUBCUTANEOUS | Status: DC
  Administered 2012-12-29: 32 [IU] via SUBCUTANEOUS

## 2012-12-29 MED ORDER — ENOXAPARIN SODIUM 40 MG/0.4ML ~~LOC~~ SOLN: 40.0000 mg | SUBCUTANEOUS | Status: DC

## 2012-12-29 MED ORDER — SODIUM CHLORIDE 0.9 % IJ SOLN: 3.0000 mL | INTRAMUSCULAR | Status: DC | PRN

## 2012-12-29 MED ORDER — ACETAMINOPHEN 325 MG PO TABS: 650.0000 mg | ORAL_TABLET | Freq: Four times a day (QID) | ORAL | Status: DC | PRN

## 2012-12-29 MED ORDER — CLINDAMYCIN PHOSPHATE 600 MG/50ML IV SOLN
600.0000 mg | Freq: Once | INTRAVENOUS | Status: DC
  Filled 2012-12-29: qty 50

## 2012-12-29 MED ORDER — HYDROCHLOROTHIAZIDE 12.5 MG PO CAPS
12.5000 mg | ORAL_CAPSULE | Freq: Every day | ORAL | Status: DC
  Administered 2012-12-30 – 2013-01-01 (×3): 12.5 mg via ORAL
  Filled 2012-12-29 (×4): qty 1

## 2012-12-29 MED ORDER — SODIUM CHLORIDE 0.9 % IV SOLN: 250.0000 mL | INTRAVENOUS | Status: DC | PRN

## 2012-12-29 MED ORDER — GLUCOSE 40 % PO GEL
1.0000 | ORAL | Status: DC | PRN
  Administered 2012-12-30: 37.5 g via ORAL
  Filled 2012-12-29: qty 1

## 2012-12-29 MED ORDER — DEXTROSE 50 % IV SOLN: 25.0000 mL | Freq: Once | INTRAVENOUS | Status: AC | PRN

## 2012-12-29 MED ORDER — OXYCODONE HCL 5 MG PO TABS
5.0000 mg | ORAL_TABLET | ORAL | Status: DC | PRN
  Administered 2012-12-30 – 2013-01-01 (×5): 5 mg via ORAL
  Filled 2012-12-29 (×5): qty 1

## 2012-12-29 MED ORDER — ACETAMINOPHEN 650 MG RE SUPP: 650.0000 mg | Freq: Four times a day (QID) | RECTAL | Status: DC | PRN

## 2012-12-29 MED ORDER — VANCOMYCIN HCL IN DEXTROSE 1-5 GM/200ML-% IV SOLN
1000.0000 mg | Freq: Two times a day (BID) | INTRAVENOUS | Status: DC
  Administered 2012-12-30 – 2012-12-31 (×3): 1000 mg via INTRAVENOUS
  Filled 2012-12-29 (×4): qty 200

## 2012-12-29 MED ORDER — METOPROLOL TARTRATE 25 MG PO TABS
25.0000 mg | ORAL_TABLET | Freq: Two times a day (BID) | ORAL | Status: DC
  Administered 2012-12-29 – 2013-01-01 (×6): 25 mg via ORAL
  Filled 2012-12-29 (×8): qty 1

## 2012-12-29 MED ORDER — PIPERACILLIN-TAZOBACTAM 3.375 G IVPB
3.3750 g | Freq: Three times a day (TID) | INTRAVENOUS | Status: DC
  Administered 2012-12-29 – 2012-12-30 (×2): 3.375 g via INTRAVENOUS
  Filled 2012-12-29 (×3): qty 50

## 2012-12-29 MED ORDER — LOSARTAN POTASSIUM 50 MG PO TABS
100.0000 mg | ORAL_TABLET | Freq: Every day | ORAL | Status: DC
  Administered 2012-12-30 – 2013-01-01 (×3): 100 mg via ORAL
  Filled 2012-12-29 (×4): qty 2

## 2012-12-29 MED ORDER — CLONIDINE HCL 0.1 MG PO TABS
0.1000 mg | ORAL_TABLET | Freq: Two times a day (BID) | ORAL | Status: DC
  Administered 2012-12-29 – 2013-01-01 (×6): 0.1 mg via ORAL
  Filled 2012-12-29 (×8): qty 1

## 2012-12-29 MED ORDER — CLOPIDOGREL BISULFATE 75 MG PO TABS
75.0000 mg | ORAL_TABLET | Freq: Every day | ORAL | Status: DC
  Administered 2012-12-30: 75 mg via ORAL
  Filled 2012-12-29 (×2): qty 1

## 2012-12-29 MED ORDER — INSULIN ASPART PROT & ASPART (70-30 MIX) 100 UNIT/ML ~~LOC~~ SUSP
52.0000 [IU] | Freq: Every day | SUBCUTANEOUS | Status: DC
  Administered 2012-12-30: 52 [IU] via SUBCUTANEOUS
  Filled 2012-12-29: qty 10

## 2012-12-29 MED ORDER — SODIUM CHLORIDE 0.9 % IJ SOLN
3.0000 mL | Freq: Two times a day (BID) | INTRAMUSCULAR | Status: DC
  Administered 2012-12-29 – 2013-01-01 (×3): 3 mL via INTRAVENOUS

## 2012-12-29 MED ORDER — FUROSEMIDE 80 MG PO TABS
80.0000 mg | ORAL_TABLET | Freq: Two times a day (BID) | ORAL | Status: DC
  Administered 2012-12-29 – 2013-01-01 (×6): 80 mg via ORAL
  Filled 2012-12-29 (×8): qty 1

## 2012-12-29 MED ORDER — METFORMIN HCL 500 MG PO TABS
1000.0000 mg | ORAL_TABLET | Freq: Two times a day (BID) | ORAL | Status: DC
  Filled 2012-12-29 (×3): qty 2

## 2012-12-29 MED ORDER — LOSARTAN POTASSIUM-HCTZ 100-12.5 MG PO TABS: 1.0000 | ORAL_TABLET | Freq: Every day | ORAL | Status: DC

## 2012-12-29 MED ORDER — ENOXAPARIN SODIUM 60 MG/0.6ML ~~LOC~~ SOLN
60.0000 mg | SUBCUTANEOUS | Status: DC
  Administered 2012-12-29: 60 mg via SUBCUTANEOUS
  Filled 2012-12-29 (×3): qty 0.6

## 2012-12-29 NOTE — ED Notes (Signed)
Attempted IV start x2 and was unsuccessful.  FM, CN aware.

## 2012-12-29 NOTE — H&P (Addendum)
[facility] History and Physical  [name]:[id number] DOB: [birth year] DOA: 12/29/2012  Referring physician: Bernette Mayers PCP: Lolita Patella, MD  Specialists: none  Chief Complaint: RUE swelling and with drainage  HPI: [name] is a 57 y.o. male  With PMH significant for DM, CHF/CM (with chronic peripheral edema per his report who presents with the above compalaints. He states that 2 months ago he developed a 'bump' on the dorsum of his R. Hand and subsequently saw his PCP who prescribed lamisil cream which he used for about 2weeks, but subsequently stopped it and started using cortisone cream there instead. It had seemed to be getting better at first but About 2-3weeks a began spreading >> up his right am and seemed to spread to what ever part he touched but now all has lesions almost everywhere on his body. He states the rash is pruritic . In the past couple of weeks as well he developed increased swelling in the RUE, and the skin appeared to be peeling away on that arm and it has been weeping. Pt states PCP referred him to dermatology, but earliest appt for NOV 28TH. He admits to pain in R. Arm. He denies fevers, chest pain and no SOB.  He admits to leg swelling but states that it is longstanding and unchanged except for some weeping he began having in R. Leg. He denies having any similar rash in the past, and also denies been out in the grass/woods or any new detergents/soaps. He was seen in the ED and CBC and chemistries unremarkable, [facility] consulted for admission.   Review of Systems: The patient denies anorexia, fever, weight loss,, vision loss, decreased hearing, hoarseness, chest pain, syncope, dyspnea on exertion, balance deficits, hemoptysis, abdominal pain, melena, hematochezia, severe indigestion/heartburn, hematuria, incontinence, genital sores, muscle weakness,  transient blindness, difficulty walking, depression, unusual weight change, abnormal bleeding.   Past  Medical History  Diagnosis Date  . Blind left eye   . Hypertension   . Diabetes mellitus without complication   . Gout   . Herpes   . CHF (congestive heart failure)   . Cardiomyopathy   . Hypercholesterolemia   . MI (myocardial infarction)   . Pneumonia   . Adrenal insufficiency   . Peripheral neuropathy   . Carpal tunnel syndrome   . PVD (peripheral vascular disease)   . ED (erectile dysfunction)   . Allergy   . Cataract     bil removed   Past Surgical History  Procedure Laterality Date  . Cholecystectomy    . Stents    . Dilatera cataracts removed    . Right knee surgary     Social History:  reports that he has never smoked. He has never used smokeless tobacco. He reports that he does not drink alcohol or use illicit drugs.  where does patient live--home   Allergies  Allergen Reactions  . Iohexol     hives  . Ivp Dye [Iodinated Diagnostic Agents]     hives    Family History  Problem Relation Age of Onset  . Breast cancer Mother   . Hypertension Father     blood clot in leg  . Diabetes Maternal Aunt   . Heart attack Sister   . Colon cancer Neg Hx   . Esophageal cancer Neg Hx   . Rectal cancer Neg Hx   . Stomach cancer Neg Hx     Prior to Admission medications   Medication Sig Start Date End Date Taking?  Authorizing Provider  aspirin 325 MG tablet Take 325 mg by mouth daily.   Yes Historical Provider, MD  atorvastatin (LIPITOR) 40 MG tablet Take 40 mg by mouth daily.   Yes Historical Provider, MD  cloNIDine (CATAPRES) 0.1 MG tablet Take 0.1 mg by mouth 2 (two) times daily.   Yes Historical Provider, MD  clopidogrel (PLAVIX) 75 MG tablet Take by mouth daily.   Yes Historical Provider, MD  furosemide (LASIX) 80 MG tablet Take 80 mg by mouth 2 (two) times daily.   Yes Historical Provider, MD  hydrocortisone cream 1 % Apply 1 application topically 2 (two) times daily.   Yes Historical Provider, MD  indomethacin (INDOCIN) 25 MG capsule Take 1 capsule (25 mg  total) by mouth 3 (three) times daily as needed. 10/12/12  Yes Fayrene Helper, PA-C  insulin NPH-regular (NOVOLIN 70/30) (70-30) 100 UNIT/ML injection Inject into the skin. 52 units in the morning and 32 units in the evening   Yes Historical Provider, MD  losartan-hydrochlorothiazide (HYZAAR) 100-12.5 MG per tablet Take 1 tablet by mouth daily.   Yes Historical Provider, MD  metFORMIN (GLUCOPHAGE) 1000 MG tablet Take 1,000 mg by mouth 2 (two) times daily with a meal.   Yes Historical Provider, MD  metoprolol tartrate (LOPRESSOR) 25 MG tablet Take 25 mg by mouth 2 (two) times daily.   Yes Historical Provider, MD   Physical Exam: Filed Vitals:   12/29/12 1121  BP: 133/110  Pulse: 73  Temp: 97.9 F (36.6 C)  Resp: 16    Constitutional: Vital signs reviewed.  Patient is a morbidly obese male and well-developed, in no acute distress and cooperative with exam. Alert and oriented x3.  Head: Normocephalic and atraumatic Eyes: PERRL, EOMI, conjunctivae normal, No scleral icterus.  Neck: Supple, Trachea midline normal ROM, No JVD, mass, thyromegaly, or carotid bruit present.  Cardiovascular: RRR, S1 normal, S2 normal, no MRG, pulses symmetric and intact bilaterally Pulmonary/Chest: normal respiratory effort, CTAB, no wheezes, rales, or rhonchi Abdominal: Soft. Non-tender, non-distended, bowel sounds are normal, no masses, organomegaly, or guarding present.  Extremities:RUE markedly edematous,+erythyema, +tenderness with excoriation of the skin, draining serous fluid. LUE & bil LE  Also with lesions as describe below with a coalescense lesions/plaque noted laterally on R. Upper thigh area. +2-3 bil LE edema present with no cyanosis, R.lower leg medially with some excoriation and weeping  Neurological: A&O x3, Strength is normal and symmetric bilaterally, cranial nerve II-XII are grossly intact, no focal motor deficit, sensory intact to light touch bilaterally.  Skin: papular lesions with scabs, eshcars in  some and scattered over back ,anteriorly in trunk , legs- R. Leg medially has some plaque like lesions dark,some scales , arms as well with some lesions on face.see RUE above   Psychiatric: Normal mood and affect. speech and behavior is normal. Judgment and thought content normal. Cognition and memory are normal.    Labs on Admission:  Basic Metabolic Panel:  Recent Labs Lab 12/29/12 1240  NA 144  K 4.2  CL 106  CO2 29  GLUCOSE 94  BUN 15  CREATININE 1.05  CALCIUM 9.0   Liver Function Tests: No results found for this basename: AST, ALT, ALKPHOS, BILITOT, PROT, ALBUMIN,  in the last 168 hours No results found for this basename: LIPASE, AMYLASE,  in the last 168 hours No results found for this basename: AMMONIA,  in the last 168 hours CBC:  Recent Labs Lab 12/29/12 1240  WBC 9.9  HGB 12.4*  HCT 39.5  MCV 88.0  PLT 327   Cardiac Enzymes: No results found for this basename: CKTOTAL, CKMB, CKMBINDEX, TROPONINI,  in the last 168 hours  BNP (last 3 results)  Recent Labs  10/12/12 2120  PROBNP 386.7*   CBG: No results found for this basename: GLUCAP,  in the last 168 hours  Radiological Exams on Admission: No results found.   Assessment/Plan Active Problems:   RUECellulitis -As discussed above, superimposed on some rash of unclear etiology - seebelow as discussed in this diabetic pt -follow obtain cultures -empiric abx with vanc and zosyn -wound care consult   Rash and nonspecific skin eruption -Unclear etiology, atypical psoriasis a possibility -recommend consulted Dermatologist in am if available- He was to see Dr Margo Aye on 11/28   Diabetes type 2, uncontrolled -continue insulin 70/30 and meformin with SSI coverage   Hypertension -continue outpt meds, monitor and further treat accordingly   Cardiomyopathy   chronic CHF (congestive heart failure) -continue oral lasix, check BNP -follow and obtain record from PCP's office of last echo   Peripheral  neuropathy -pain management    Code Status: full  Family Communication: family at bedside Disposition Plan: to home when medically stable  Time spent: >28mins  Kela Millin [facility] Pager [phone]  If 7PM-7AM, please contact night-coverage [email or web address] Password [email or web address] 12/29/2012, 3:22 PM

## 2012-12-29 NOTE — ED Provider Notes (Signed)
CSN: [id number]     Arrival date & time 12/29/12  1058 History   First MD Initiated Contact with Patient 12/29/12 1134     Chief Complaint  Patient presents with  . Cellulitis   (Consider location/radiation/quality/duration/timing/severity/associated sxs/prior Treatment) HPI Pt is a 57yo male with hx of diabetes, HTN, CHF, and PVD c/o right arm swelling associated with a diffuse scaling rash on his arms, torso, back, and lower legs. States the first rash started on his right hand a few weeks go but within the last week, areas have become red, bleeding, and oozing yellow discharge. Pt states he was seen by his PCP last week and given hydrocortisone cream which did help but the rash came back and became a lot worse.  States he does not have much pain but does feel a lot of pressure in his right arm and both legs and the rash is very pruritic.  He is suppose to follow up with a dermatologist, Dr. Margo Aye in a few weeks but stated he can't wait that long cause the rash has become a lot worse.  He is unsure if it is psoriasis or not. Denies known allergies. Denies chest pain, SOB, nausea vomiting or abdominal pain.  Pt has difficulty seeing so he does not regularly check his blood sugar. He is waiting for a new glucose monitor that says his readings out loud.  PCP: Dr. Nicholos Johns  Past Medical History  Diagnosis Date  . Blind left eye   . Hypertension   . Diabetes mellitus without complication   . Gout   . Herpes   . CHF (congestive heart failure)   . Cardiomyopathy   . Hypercholesterolemia   . MI (myocardial infarction)   . Pneumonia   . Adrenal insufficiency   . Peripheral neuropathy   . Carpal tunnel syndrome   . PVD (peripheral vascular disease)   . ED (erectile dysfunction)   . Allergy   . Cataract     bil removed   Past Surgical History  Procedure Laterality Date  . Cholecystectomy    . Stents    . Dilatera cataracts removed    . Right knee surgary     Family History  Problem  Relation Age of Onset  . Breast cancer Mother   . Hypertension Father     blood clot in leg  . Diabetes Maternal Aunt   . Heart attack Sister   . Colon cancer Neg Hx   . Esophageal cancer Neg Hx   . Rectal cancer Neg Hx   . Stomach cancer Neg Hx    History  Substance Use Topics  . Smoking status: Never Smoker   . Smokeless tobacco: Never Used  . Alcohol Use: No    Review of Systems  Constitutional: Negative for fever and chills.  Cardiovascular: Positive for leg swelling. Negative for chest pain and palpitations.  Musculoskeletal: Positive for joint swelling and myalgias.  Skin: Positive for color change, rash and wound.  All other systems reviewed and are negative.    Allergies  Iohexol and Ivp dye  Home Medications   Current Outpatient Rx  Name  Route  Sig  Dispense  Refill  . aspirin 325 MG tablet   Oral   Take 325 mg by mouth daily.         Marland Kitchen atorvastatin (LIPITOR) 40 MG tablet   Oral   Take 40 mg by mouth daily.         . cloNIDine (CATAPRES) 0.1 MG  tablet   Oral   Take 0.1 mg by mouth 2 (two) times daily.         . clopidogrel (PLAVIX) 75 MG tablet   Oral   Take by mouth daily.         . furosemide (LASIX) 80 MG tablet   Oral   Take 80 mg by mouth 2 (two) times daily.         . hydrocortisone cream 1 %   Topical   Apply 1 application topically 2 (two) times daily.         . indomethacin (INDOCIN) 25 MG capsule   Oral   Take 1 capsule (25 mg total) by mouth 3 (three) times daily as needed.   30 capsule   0   . insulin NPH-regular (NOVOLIN 70/30) (70-30) 100 UNIT/ML injection   Subcutaneous   Inject into the skin. 52 units in the morning and 32 units in the evening         . losartan-hydrochlorothiazide (HYZAAR) 100-12.5 MG per tablet   Oral   Take 1 tablet by mouth daily.         . metFORMIN (GLUCOPHAGE) 1000 MG tablet   Oral   Take 1,000 mg by mouth 2 (two) times daily with a meal.         . metoprolol tartrate  (LOPRESSOR) 25 MG tablet   Oral   Take 25 mg by mouth 2 (two) times daily.          BP 133/110  Pulse 73  Temp(Src) 97.9 F (36.6 C) (Oral)  Resp 16  Ht 5\' 9"  (1.753 m)  Wt 260 lb (117.935 kg)  BMI 38.38 kg/m2  SpO2 100% Physical Exam  Nursing note and vitals reviewed. Constitutional: He appears well-developed and well-nourished.  Pt lying comfortably in exam bed, NAD.    HENT:  Head: Normocephalic and atraumatic.  Eyes: Conjunctivae are normal. No scleral icterus.  Neck: Normal range of motion.  Cardiovascular: Normal rate, regular rhythm and normal heart sounds.   Pulmonary/Chest: Effort normal and breath sounds normal. No respiratory distress. He has no wheezes. He has no rales. He exhibits no tenderness.  Abdominal: Soft. Bowel sounds are normal. He exhibits no distension and no mass. There is no tenderness. There is no rebound and no guarding.  Musculoskeletal: Normal range of motion. He exhibits edema ( right hand and forearm, 3+ bilateral leg edema). He exhibits no tenderness.  Neurological: He is alert.  Skin: Skin is warm and dry. Rash noted. There is erythema.  Severe excoriation to right forearm and diffuse flaking excoriated patches on back, torso and legs.  Lesions on legs are oozing yellow pus.    ED Course  Procedures (including critical care time) Labs Review Labs Reviewed  CBC - Abnormal; Notable for the following:    Hemoglobin 12.4 (*)    All other components within normal limits  BASIC METABOLIC PANEL - Abnormal; Notable for the following:    GFR calc non Af Amer 77 (*)    GFR calc Af Amer 89 (*)    All other components within normal limits  CG4 I-STAT (LACTIC ACID)   Imaging Review No results found.  EKG Interpretation   None       MDM   1. Hypertension   2. Cardiomyopathy   3. CHF (congestive heart failure)   4. Peripheral neuropathy    Pt c/o worsening pruritic rash and swelling. On exam moderate edema of right hand and forearm,  2-3+ bilateral leg edema. Diffuse rash with excoriation, some lesions draining yellow pus.  Pt denies chest pain or SOB. Do not believe pedal edema is due to pt's cardiac hx. Believe to be related to cellulitis.  Blood work ordered: CBC, BMP, lactic acid.  Discussed pt with Dr. Bernette Mayers who also examined pt and believes pt should be admitted for cellulitis and started on IV clindamycin.  Labs: unremarkable, although due to diffuse excoriation and swelling, Dr. Bernette Mayers believes pt still needs to be admitted for IV antibiotics. Pt has been started on Clindamycin. Will consult hospitalist to admit   Consulted with Dr. Suanne Marker, internal medicine, who agreed to come see pt.  Pt will be admitted for cellulitis to med-surg.  Junius Finner, PA-C 12/29/12 [phone]

## 2012-12-29 NOTE — ED Notes (Signed)
Hospitalist bedside 

## 2012-12-29 NOTE — ED Notes (Signed)
IV team attempted IV start and was unsuccessful.  Denny Peon, PA made aware.

## 2012-12-29 NOTE — ED Notes (Signed)
Fm, CN bedside for IV start.

## 2012-12-29 NOTE — ED Notes (Signed)
Charge unsuccessful IV attempt. IV team paged

## 2012-12-29 NOTE — Progress Notes (Addendum)
ANTIBIOTIC CONSULT NOTE - INITIAL  Pharmacy Consult for Vancomycin and Zosyn Indication: cellulitis  Allergies  Allergen Reactions  . Iohexol     hives  . Ivp Dye [Iodinated Diagnostic Agents]     hives    Patient Measurements: Height: 5\' 9"  (175.3 cm) Weight: 260 lb (117.935 kg) IBW/kg (Calculated) : 70.7 Adjusted Body Weight:   Vital Signs: Temp: 98.2 F (36.8 C) (11/03 1748) Temp src: Oral (11/03 1748) BP: 162/63 mmHg (11/03 1748) Pulse Rate: 73 (11/03 1748) Intake/Output from previous day:   Intake/Output from this shift:    Labs:  Recent Labs  12/29/12 1240  WBC 9.9  HGB 12.4*  PLT 327  CREATININE 1.05   Estimated Creatinine Clearance: 98.4 ml/min (by C-G formula based on Cr of 1.05). No results found for this basename: VANCOTROUGH, VANCOPEAK, VANCORANDOM, GENTTROUGH, GENTPEAK, GENTRANDOM, TOBRATROUGH, TOBRAPEAK, TOBRARND, AMIKACINPEAK, AMIKACINTROU, AMIKACIN,  in the last 72 hours   Microbiology: No results found for this or any previous visit (from the past 720 hour(s)).  Medical History: Past Medical History  Diagnosis Date  . Blind left eye   . Hypertension   . Diabetes mellitus without complication   . Gout   . Herpes   . CHF (congestive heart failure)   . Cardiomyopathy   . Hypercholesterolemia   . MI (myocardial infarction)   . Pneumonia   . Adrenal insufficiency   . Peripheral neuropathy   . Carpal tunnel syndrome   . PVD (peripheral vascular disease)   . ED (erectile dysfunction)   . Allergy   . Cataract     bil removed    Medications:  Scheduled:  . [START ON 12/30/2012] aspirin  325 mg Oral Daily  . [START ON 12/30/2012] atorvastatin  40 mg Oral Daily  . cloNIDine  0.1 mg Oral BID  . [START ON 12/30/2012] clopidogrel  75 mg Oral QAC breakfast  . enoxaparin (LOVENOX) injection  60 mg Subcutaneous Q24H  . furosemide  80 mg Oral BID  . [START ON 12/30/2012] losartan  100 mg Oral Daily   And  . [START ON 12/30/2012]  hydrochlorothiazide  12.5 mg Oral Daily  . insulin aspart protamine- aspart  32 Units Subcutaneous Q supper  . [START ON 12/30/2012] insulin aspart protamine- aspart  52 Units Subcutaneous Q breakfast  . [START ON 12/30/2012] metFORMIN  1,000 mg Oral BID WC  . metoprolol tartrate  25 mg Oral BID  . sodium chloride  3 mL Intravenous Q12H  . sodium chloride  3 mL Intravenous Q12H   Infusions:   PRN: sodium chloride, acetaminophen, acetaminophen, diphenhydrAMINE, indomethacin, ondansetron (ZOFRAN) IV, ondansetron, oxyCODONE, sodium chloride  Assessment: 57 yo M with cellulitis. Patient has a  hx of diabetes, HTN, CHF, and PVD c/o right arm swelling associated with a diffuse scaling rash on his arms, torso, back, and lower legs. States the first rash started on his right hand a few weeks go but within the last week, areas have become red, bleeding, and oozing yellow discharge.    Goal of Therapy:  Vancomycin trough level 10-15 mcg/ml  Plan:  Will begin with Vancomycin 2000mg  IV x 1 (based on wt > 100kg), then follow with 1000mg  IV q 12 hours. Zosyn 3.375Gm IV q 8 hours--infused over 4 hours. Measure antibiotic drug levels at steady state Follow up culture results  Loletta Specter 12/29/2012,6:05 PM

## 2012-12-29 NOTE — ED Provider Notes (Signed)
Medical screening examination/treatment/procedure(s) were conducted as a shared visit with non-physician practitioner(s) and myself.  I personally evaluated the patient during the encounter.  EKG Interpretation   None       Pt with rash on extremities of unknown etiology but probably eczema or psoriasis worsening over the last several days now with significant swelling, tenderness and weeping concerning for superimposed infection  [name] B. Bernette Mayers, MD 12/29/12 1536

## 2012-12-29 NOTE — ED Notes (Signed)
Pt c/o eczema on the arms, legs, back and abd for the past two months.  Pt reports infection to the areas. Pt skin is open and draining pus.  Denies fever or chills.

## 2012-12-29 NOTE — ED Notes (Signed)
IV team RN bedside 

## 2012-12-30 DIAGNOSIS — I10 Essential (primary) hypertension: Secondary | ICD-10-CM | POA: Diagnosis not present

## 2012-12-30 DIAGNOSIS — I059 Rheumatic mitral valve disease, unspecified: Secondary | ICD-10-CM | POA: Diagnosis not present

## 2012-12-30 DIAGNOSIS — I509 Heart failure, unspecified: Secondary | ICD-10-CM | POA: Diagnosis not present

## 2012-12-30 DIAGNOSIS — M7989 Other specified soft tissue disorders: Secondary | ICD-10-CM

## 2012-12-30 DIAGNOSIS — I428 Other cardiomyopathies: Secondary | ICD-10-CM | POA: Diagnosis not present

## 2012-12-30 DIAGNOSIS — M79609 Pain in unspecified limb: Secondary | ICD-10-CM

## 2012-12-30 DIAGNOSIS — L0291 Cutaneous abscess, unspecified: Secondary | ICD-10-CM | POA: Diagnosis not present

## 2012-12-30 LAB — GLUCOSE, CAPILLARY
Glucose-Capillary: 47 mg/dL — ABNORMAL LOW (ref 70–99)
Glucose-Capillary: 147 mg/dL — ABNORMAL HIGH (ref 70–99)
Glucose-Capillary: 152 mg/dL — ABNORMAL HIGH (ref 70–99)

## 2012-12-30 LAB — BASIC METABOLIC PANEL
BUN: 11 mg/dL (ref 6–23)
Calcium: 8.3 mg/dL — ABNORMAL LOW (ref 8.4–10.5)
Chloride: 108 mEq/L (ref 96–112)
GFR calc non Af Amer: 82 mL/min — ABNORMAL LOW (ref >90)
Glucose, Bld: 79 mg/dL (ref 70–99)
Potassium: 3 mEq/L — ABNORMAL LOW (ref 3.5–5.1)
Sodium: 145 mEq/L (ref 135–145)

## 2012-12-30 LAB — CBC
HCT: 35.5 % — ABNORMAL LOW (ref 39.0–52.0)
Hemoglobin: 11.3 g/dL — ABNORMAL LOW (ref 13.0–17.0)
RBC: 4.04 MIL/uL — ABNORMAL LOW (ref 4.22–5.81)
WBC: 7.3 10*3/uL (ref 4.0–10.5)

## 2012-12-30 LAB — HEMOGLOBIN A1C: Hgb A1c MFr Bld: 8.8 % — ABNORMAL HIGH (ref <5.7)

## 2012-12-30 MED ORDER — INSULIN ASPART PROT & ASPART (70-30 MIX) 100 UNIT/ML ~~LOC~~ SUSP
26.0000 [IU] | Freq: Every day | SUBCUTANEOUS | Status: DC
  Administered 2012-12-31 – 2013-01-01 (×2): 26 [IU] via SUBCUTANEOUS

## 2012-12-30 MED ORDER — RIVAROXABAN 15 MG PO TABS
15.0000 mg | ORAL_TABLET | Freq: Two times a day (BID) | ORAL | Status: DC
  Administered 2012-12-30 – 2013-01-01 (×4): 15 mg via ORAL
  Filled 2012-12-30 (×6): qty 1

## 2012-12-30 MED ORDER — COLCHICINE 0.6 MG PO TABS
1.2000 mg | ORAL_TABLET | Freq: Once | ORAL | Status: AC
  Administered 2012-12-30: 1.2 mg via ORAL
  Filled 2012-12-30: qty 2

## 2012-12-30 MED ORDER — POTASSIUM CHLORIDE CRYS ER 20 MEQ PO TBCR
40.0000 meq | EXTENDED_RELEASE_TABLET | Freq: Once | ORAL | Status: AC
  Administered 2012-12-30: 40 meq via ORAL
  Filled 2012-12-30: qty 2

## 2012-12-30 MED ORDER — TRIAMCINOLONE ACETONIDE 0.1 % EX CREA
TOPICAL_CREAM | Freq: Two times a day (BID) | CUTANEOUS | Status: DC
  Administered 2012-12-31 – 2013-01-01 (×2): via TOPICAL
  Filled 2012-12-30 (×2): qty 15

## 2012-12-30 MED ORDER — INSULIN ASPART PROT & ASPART (70-30 MIX) 100 UNIT/ML ~~LOC~~ SUSP
16.0000 [IU] | Freq: Every day | SUBCUTANEOUS | Status: DC
  Filled 2012-12-30: qty 10

## 2012-12-30 MED ORDER — ASPIRIN 81 MG PO CHEW
81.0000 mg | CHEWABLE_TABLET | Freq: Every day | ORAL | Status: DC
Start: 2012-12-31 — End: 2012-12-30

## 2012-12-30 MED ORDER — INSULIN ASPART PROT & ASPART (70-30 MIX) 100 UNIT/ML ~~LOC~~ SUSP
16.0000 [IU] | Freq: Every day | SUBCUTANEOUS | Status: DC
  Administered 2012-12-31: 16 [IU] via SUBCUTANEOUS
  Filled 2012-12-30: qty 10

## 2012-12-30 NOTE — Progress Notes (Signed)
[facility] PROGRESS NOTE  [name] OZH:[id number] DOB: 01/17/1956 DOA: 12/29/2012 PCP: Lolita Patella, MD  Assessment/Plan: Cellulitis RUE -Resulting from secondary bacterial infection from the patient's self-inflicted scratching and resultant open wounds -Discontinue Zosyn -Continue IV vancomycin -Appreciate wound care consultation--> continue xeroform dressing -Patient has dermatology appointment 01/23/13--Dr. Margo Aye -trial of triamcinolone to other areas of plaques on body -wound culture will NOT be reliable-->likely skin flora -Retroperitoneal ultrasound to rule out DVT LE edema and pain -Venous duplex rule out DVT Diabetes mellitus type 2 -Check hemoglobin A1c -Continue home doses of 70/30 -Discontinue metformin for now Acute Gouty arthritis -Colchicine 1.2 mg x1 Chronic heart failure -Appears clinically compensated -Continue home doses of furosemide -Repeat echocardiogram -proBNP 286 Hypertension -Continue Catapres, losartan, HCTZ, Toprol tartrate -Monitor blood pressure and adjust as needed Hypokalemia -due to diuretics -replete -check mag Family Communication:   No family beside Disposition Plan:   Home when medically stable    Antibiotics:  Vancomycin 12/29/2012>>>  Zosyn 12/29/2012>>> 12/30/2012       Procedures/Studies: Dg Hand 2 View Right  12/29/2012   CLINICAL DATA:  57 year old male with right hand pain and stiffness. No known injury.  EXAM: RIGHT HAND - 2 VIEW  COMPARISON:  None.  FINDINGS: Distal radius and ulna intact. Carpal bone alignment within normal limits. Metacarpals and phalanges intact. Degenerative changes at the right common MCP joint with joint space loss, subchondral sclerosis and osteophytosis. Mild degenerative changes elsewhere.  IMPRESSION: No acute osseous abnormality identified in the right hand. Osteoarthritis at the right thumb MCP joint.   Electronically Signed   By: Augusto Gamble M.D.   On: 12/29/2012 19:29          Subjective: Patient complains of itching and pain to the right upper extremity. Denies fevers, chills, chest pain, shortness breath, coughing, hemoptysis, nausea, vomiting, diarrhea, abdominal pain, dysuria.  Objective: Filed Vitals:   12/29/12 1553 12/29/12 1748 12/29/12 2040 12/30/12 0540  BP: 205/84 162/63 174/80 133/83  Pulse: 77 73 77   Temp:  98.2 F (36.8 C) 98.1 F (36.7 C) 98 F (36.7 C)  TempSrc:  Oral Oral Oral  Resp: 20 18 16 18   Height:      Weight:      SpO2: 100% 100% 99% 98%    Intake/Output Summary (Last 24 hours) at 12/30/12 0913 Last data filed at 12/30/12 6295  Gross per 24 hour  Intake    100 ml  Output    400 ml  Net   -300 ml   Weight change:  Exam:   General:  Pt is alert, follows commands appropriately, not in acute distress  HEENT: No icterus, No thrush, No neck mass, [place]/AT  Cardiovascular: RRR, S1/S2, no rubs, no gallops  Respiratory: CTA bilaterally, no wheezing, no crackles, no rhonchi  Abdomen: Soft/+BS, non tender, non distended, no guarding  Extremities: 1+ edema bilateral lower extremities with scattered lichenified plaques. 2+ edema right upper extremity with lichenified plaques from the dorsal hand to the epitrochlear area. There is erythematous but from the hand to the epitrochlear area without any crepitance. Scattered serosanguineous oozing of the right upper extremity.  Data Reviewed: Basic Metabolic Panel:  Recent Labs Lab 12/29/12 1240 12/30/12 0510  NA 144 145  K 4.2 3.0*  CL 106 108  CO2 29 29  GLUCOSE 94 79  BUN 15 11  CREATININE 1.05 1.00  CALCIUM 9.0 8.3*   Liver Function Tests: No results found for this basename: AST, ALT, ALKPHOS, BILITOT, PROT,  ALBUMIN,  in the last 168 hours No results found for this basename: LIPASE, AMYLASE,  in the last 168 hours No results found for this basename: AMMONIA,  in the last 168 hours CBC:  Recent Labs Lab 12/29/12 1240 12/30/12 0510  WBC 9.9 7.3  HGB  12.4* 11.3*  HCT 39.5 35.5*  MCV 88.0 87.9  PLT 327 300   Cardiac Enzymes: No results found for this basename: CKTOTAL, CKMB, CKMBINDEX, TROPONINI,  in the last 168 hours BNP: No components found with this basename: POCBNP,  CBG:  Recent Labs Lab 12/29/12 1754 12/29/12 2148 12/30/12 0759  GLUCAP 75 106* 78    No results found for this or any previous visit (from the past 240 hour(s)).   Scheduled Meds: . aspirin  325 mg Oral Daily  . atorvastatin  40 mg Oral Daily  . cloNIDine  0.1 mg Oral BID  . clopidogrel  75 mg Oral QAC breakfast  . enoxaparin (LOVENOX) injection  60 mg Subcutaneous Q24H  . furosemide  80 mg Oral BID  . losartan  100 mg Oral Daily   And  . hydrochlorothiazide  12.5 mg Oral Daily  . insulin aspart  0-9 Units Subcutaneous TID WC  . insulin aspart protamine- aspart  32 Units Subcutaneous Q supper  . insulin aspart protamine- aspart  52 Units Subcutaneous Q breakfast  . metoprolol tartrate  25 mg Oral BID  . piperacillin-tazobactam (ZOSYN)  IV  3.375 g Intravenous Q8H  . sodium chloride  3 mL Intravenous Q12H  . sodium chloride  3 mL Intravenous Q12H  . vancomycin  1,000 mg Intravenous Q12H   Continuous Infusions:    Nikcole Eischeid, DO  [facility] Pager [phone]  If 7PM-7AM, please contact night-coverage [email or web address] Password [email or web address] 12/30/2012, 9:13 AM   LOS: 1 day

## 2012-12-30 NOTE — Progress Notes (Signed)
Hypoglycemic Event  CBG: 47  Treatment: 15 GM carbohydrate snack  Symptoms: None  Follow-up CBG: Time: 1745 CBG Result:65  Possible Reasons for Event: Inadequate meal intake  Comments/MD notified: hold SSI continue to monitor and recheck in one hour    [name]  Remember to initiate Hypoglycemia Order Set & complete

## 2012-12-30 NOTE — Progress Notes (Signed)
Received report from [place], Charity fundraiser. No new changes to assessment done.  This shift Will continue to monitor and intervene as appropriate.

## 2012-12-30 NOTE — Progress Notes (Signed)
Utilization review completed.  

## 2012-12-30 NOTE — Progress Notes (Signed)
Echocardiogram 2D Echocardiogram has been performed.  [name] 12/30/2012, 11:31 AM

## 2012-12-30 NOTE — Progress Notes (Signed)
Hypoglycemic Event  CBG: 65  Treatment: 15 GM gel  Symptoms: None  Follow-up CBG: Time:1849 CBG Result:147  Possible Reasons for Event: Inadequate meal intake  Comments/MD notified:no new orders; continue to monitor     Talmage Nap  Remember to initiate Hypoglycemia Order Set & complete

## 2012-12-30 NOTE — Consult Note (Addendum)
WOC discussed case with bedside nurse, she reports main area that is weeping or oozing is the RUE. This patient presents with extensive rash that is questionable per admitting MD notes for eczema or psoriasis, this would be considered outside of the scope of practice for the [facility] nurse as many times this is treated with oral and topical steroids and possible UV light therapy.  For the current weeping areas to control exudate I will order xeroform gauze which will be nonadherent and antibacterial. May contribute to some drying of these areas also.  Bedside nurse to apply dressing.  Ok to try silicone foam on the LE weeping area if they will adhere to dry skin.  Discussed POC with patient and bedside nurse.  Re consult if needed, will not follow at this time. Thanks  Taren Dymek [employer], CWOCN [phone])  WOC to bedside for visual inspection of these wounds, he does have lesions over his trunk and LUE <RUE.Marland Kitchen He has crust over many of these lesions, unclear etiology.  Dr. Arbutus Leas at bedside to discuss DVT found in the RUE. Lesions over his back and some under axilla.  Needs dermatological evaluation, possible bx for definitive dx.  Will continue xeroform for now for weeping and antibacterial effects.

## 2012-12-30 NOTE — Progress Notes (Signed)
Notified by RN of patient's hypoglycemia this afternoon--65, half-way through meal.  Will hold 70/30 for tonight.  Decrease  tomorrow's 70/30 to half of home doses.  Spoke with Dr. Eldridge Dace about pt's ASA and plavix.  He was okay to d/c antiplatelet agents as patient will be started on Xarelto for his DVT.  I discussed risks, benefits, alternatives of warfarin vs Xarelto for DVT with the patient.  Pt expressed understanding and chose Xarelto.  DTat

## 2012-12-30 NOTE — Progress Notes (Signed)
*  Preliminary Results* Right upper extremity venous duplex completed. Study was technically limited due to skin lesions of the forearm and antecubital fossa. Right upper extremity is positive for deep vein thrombosis involving a single brachial vein.  Bilateral lower extremity venous duplex completed. Bilateral lower extremities are negative for deep vein thrombosis. There is no evidence of Baker's cyst bilaterally.  Preliminary results discussed with RN Shanda Bumps.  12/30/2012 11:42 AM  Gertie Fey, RVT, RDCS, RDMS

## 2012-12-31 DIAGNOSIS — I509 Heart failure, unspecified: Secondary | ICD-10-CM | POA: Diagnosis not present

## 2012-12-31 DIAGNOSIS — I10 Essential (primary) hypertension: Secondary | ICD-10-CM | POA: Diagnosis not present

## 2012-12-31 DIAGNOSIS — L0291 Cutaneous abscess, unspecified: Secondary | ICD-10-CM | POA: Diagnosis not present

## 2012-12-31 LAB — BASIC METABOLIC PANEL
BUN: 12 mg/dL (ref 6–23)
CO2: 29 mEq/L (ref 19–32)
Chloride: 105 mEq/L (ref 96–112)
Creatinine, Ser: 1.15 mg/dL (ref 0.50–1.35)
GFR calc Af Amer: 80 mL/min — ABNORMAL LOW (ref >90)
Glucose, Bld: 165 mg/dL — ABNORMAL HIGH (ref 70–99)
Sodium: 142 mEq/L (ref 135–145)

## 2012-12-31 LAB — CBC
HCT: 33.5 % — ABNORMAL LOW (ref 39.0–52.0)
Hemoglobin: 10.4 g/dL — ABNORMAL LOW (ref 13.0–17.0)
MCH: 27.3 pg (ref 26.0–34.0)
MCHC: 31 g/dL (ref 30.0–36.0)
MCV: 87.9 fL (ref 78.0–100.0)
WBC: 6.7 10*3/uL (ref 4.0–10.5)

## 2012-12-31 LAB — GLUCOSE, CAPILLARY
Glucose-Capillary: 140 mg/dL — ABNORMAL HIGH (ref 70–99)
Glucose-Capillary: 124 mg/dL — ABNORMAL HIGH (ref 70–99)

## 2012-12-31 MED ORDER — POTASSIUM CHLORIDE CRYS ER 20 MEQ PO TBCR
40.0000 meq | EXTENDED_RELEASE_TABLET | Freq: Once | ORAL | Status: AC
  Administered 2012-12-31: 40 meq via ORAL
  Filled 2012-12-31: qty 2

## 2012-12-31 MED ORDER — DOXYCYCLINE HYCLATE 100 MG PO TABS
100.0000 mg | ORAL_TABLET | Freq: Two times a day (BID) | ORAL | Status: DC
  Administered 2012-12-31 – 2013-01-01 (×3): 100 mg via ORAL
  Filled 2012-12-31 (×4): qty 1

## 2012-12-31 NOTE — Progress Notes (Signed)
[facility] PROGRESS NOTE  DEANE WATTENBARGER ZOX:[id number] DOB: [birth year] DOA: 12/29/2012 PCP: Lolita Patella, MD  Assessment/Plan: ?Cellulitis RUE -Doubt cellulitis; believe this is more weeping from edema from RUE DVT on top of his baseline skin condition. -Will DC vancomycin and place him on doxy for 10 days.  RUE DVT -Has been started on xarelto.  Rash -Unsure of etiology but suspicious for psoriases. -Already has a dermatology appointment scheduled for later this month.  DM 2 -Well controlled. -Continue current regimen.  Gout -No current pain that would appear to be gouty in nature.  Chronic Diastolic CHF -Compensated.  HTN -Well controlled.  Hypokalemia -3.4 today. -Will continue to replete PO. -Mg ok at 2.0.    Code Status: Full Code Family Communication: Patient only  Disposition Plan: Home when stable. Likely 24-48 hours.   Consultants:  None   Antibiotics:  Vancomycin DC 12/31/12  Doxycycline 12/31/12-->   Subjective: No complaints.  Objective: Filed Vitals:   12/31/12 0700 12/31/12 0938 12/31/12 0939 12/31/12 1437  BP: 152/68 182/73  121/84  Pulse: 64  66 62  Temp: 98.4 F (36.9 C)   98.1 F (36.7 C)  TempSrc: Oral   Oral  Resp: 18   18  Height:      Weight:      SpO2: 93%   95%    Intake/Output Summary (Last 24 hours) at 12/31/12 1458 Last data filed at 12/31/12 1300  Gross per 24 hour  Intake    726 ml  Output   2400 ml  Net  -1674 ml   Filed Weights   12/29/12 1121  Weight: 117.935 kg (260 lb)    Exam:   General:  AA Ox3  Cardiovascular: RRR, no M/R/G  Respiratory: CTA B  Abdomen: S/NT/ND/+BS  Extremities: 1+edema bilaterally.  Neurologic:  Non-focal  Data Reviewed: Basic Metabolic Panel:  Recent Labs Lab 12/29/12 1240 12/30/12 0510 12/31/12 0530  NA 144 145 142  K 4.2 3.0* 3.4*  CL 106 108 105  CO2 29 29 29   GLUCOSE 94 79 165*  BUN 15 11 12   CREATININE 1.05 1.00 1.15  CALCIUM 9.0  8.3* 8.8  MG  --   --  2.0   Liver Function Tests: No results found for this basename: AST, ALT, ALKPHOS, BILITOT, PROT, ALBUMIN,  in the last 168 hours No results found for this basename: LIPASE, AMYLASE,  in the last 168 hours No results found for this basename: AMMONIA,  in the last 168 hours CBC:  Recent Labs Lab 12/29/12 1240 12/30/12 0510 12/31/12 0530  WBC 9.9 7.3 6.7  HGB 12.4* 11.3* 10.4*  HCT 39.5 35.5* 33.5*  MCV 88.0 87.9 87.9  PLT 327 300 270   Cardiac Enzymes: No results found for this basename: CKTOTAL, CKMB, CKMBINDEX, TROPONINI,  in the last 168 hours BNP (last 3 results)  Recent Labs  10/12/12 2120 12/30/12 0510  PROBNP 386.7* 286.8*   CBG:  Recent Labs Lab 12/30/12 1745 12/30/12 1849 12/30/12 2202 12/31/12 0725 12/31/12 1137  GLUCAP 65* 147* 152* 140* 164*    No results found for this or any previous visit (from the past 240 hour(s)).   Studies: Dg Hand 2 View Right  12/29/2012   CLINICAL DATA:  57 year old male with right hand pain and stiffness. No known injury.  EXAM: RIGHT HAND - 2 VIEW  COMPARISON:  None.  FINDINGS: Distal radius and ulna intact. Carpal bone alignment within normal limits. Metacarpals and phalanges intact. Degenerative changes at the  right common MCP joint with joint space loss, subchondral sclerosis and osteophytosis. Mild degenerative changes elsewhere.  IMPRESSION: No acute osseous abnormality identified in the right hand. Osteoarthritis at the right thumb MCP joint.   Electronically Signed   By: Augusto Gamble M.D.   On: 12/29/2012 19:29    Scheduled Meds: . atorvastatin  40 mg Oral Daily  . cloNIDine  0.1 mg Oral BID  . doxycycline  100 mg Oral Q12H  . furosemide  80 mg Oral BID  . losartan  100 mg Oral Daily   And  . hydrochlorothiazide  12.5 mg Oral Daily  . insulin aspart  0-9 Units Subcutaneous TID WC  . insulin aspart protamine- aspart  16 Units Subcutaneous Q supper  . insulin aspart protamine- aspart  26 Units  Subcutaneous Q breakfast  . metoprolol tartrate  25 mg Oral BID  . Rivaroxaban  15 mg Oral BID WC  . sodium chloride  3 mL Intravenous Q12H  . sodium chloride  3 mL Intravenous Q12H  . triamcinolone cream   Topical BID   Continuous Infusions:   Active Problems:   Rash and nonspecific skin eruption   Cellulitis and abscess   Diabetes type 2, uncontrolled   Hypertension   Cardiomyopathy   CHF (congestive heart failure)   Peripheral neuropathy    Time spent: 35 minutes    HERNANDEZ ACOSTA,ESTELA  [facility] Pager [phone]  If 7PM-7AM, please contact night-coverage at [email or web address], password [facility] 12/31/2012, 2:58 PM  LOS: 2 days

## 2013-01-01 DIAGNOSIS — L0291 Cutaneous abscess, unspecified: Secondary | ICD-10-CM | POA: Diagnosis not present

## 2013-01-01 DIAGNOSIS — Z86718 Personal history of other venous thrombosis and embolism: Secondary | ICD-10-CM

## 2013-01-01 DIAGNOSIS — I10 Essential (primary) hypertension: Secondary | ICD-10-CM | POA: Diagnosis not present

## 2013-01-01 DIAGNOSIS — I509 Heart failure, unspecified: Secondary | ICD-10-CM | POA: Diagnosis not present

## 2013-01-01 LAB — CBC
HCT: 34.2 % — ABNORMAL LOW (ref 39.0–52.0)
MCH: 27.1 pg (ref 26.0–34.0)
MCHC: 31 g/dL (ref 30.0–36.0)
MCV: 87.5 fL (ref 78.0–100.0)
Platelets: 286 10*3/uL (ref 150–400)
RDW: 14.2 % (ref 11.5–15.5)
WBC: 8.3 10*3/uL (ref 4.0–10.5)

## 2013-01-01 LAB — BASIC METABOLIC PANEL
BUN: 10 mg/dL (ref 6–23)
CO2: 29 mEq/L (ref 19–32)
Calcium: 8.9 mg/dL (ref 8.4–10.5)
Chloride: 105 mEq/L (ref 96–112)
Creatinine, Ser: 1.07 mg/dL (ref 0.50–1.35)
GFR calc non Af Amer: 75 mL/min — ABNORMAL LOW (ref >90)
Glucose, Bld: 160 mg/dL — ABNORMAL HIGH (ref 70–99)

## 2013-01-01 LAB — GLUCOSE, CAPILLARY: Glucose-Capillary: 162 mg/dL — ABNORMAL HIGH (ref 70–99)

## 2013-01-01 MED ORDER — RIVAROXABAN 15 MG PO TABS: 15.0000 mg | ORAL_TABLET | Freq: Two times a day (BID) | ORAL | Status: DC

## 2013-01-01 MED ORDER — HYDROXYZINE HCL 25 MG PO TABS: 25.0000 mg | ORAL_TABLET | Freq: Three times a day (TID) | ORAL | Status: DC | PRN

## 2013-01-01 NOTE — Discharge Summary (Signed)
Physician Discharge Summary  [name] UJW:[id number] DOB: January 15, 1956 DOA: 12/29/2012  PCP: Lolita Patella, MD  Admit date: 12/29/2012 Discharge date: 01/01/2013  Time spent: 45 minutes  Recommendations for Outpatient Follow-up:  -Will be discharged home today. -Has an appointment scheduled with his PCP next week. Will need prescription for Xarelto following the first 21 days of treatment. -Also advised to keep appointment with dermatology scheduled for later this month.   Discharge Diagnoses:  Active Problems:   Rash and nonspecific skin eruption   Cellulitis and abscess   Diabetes type 2, uncontrolled   Hypertension   Cardiomyopathy   CHF (congestive heart failure)   Peripheral neuropathy   DVT (deep venous thrombosis)   Discharge Condition: Stable and improved.  Filed Weights   12/29/12 1121  Weight: 117.935 kg (260 lb)    History of present illness:  [name] is a 57 y.o. male With PMH significant for DM, CHF/CM (with chronic peripheral edema per his report who presents with the above compalaints. He states that 2 months ago he developed a 'bump' on the dorsum of his R. Hand and subsequently saw his PCP who prescribed lamisil cream which he used for about 2weeks, but subsequently stopped it and started using cortisone cream there instead. It had seemed to be getting better at first but About 2-3weeks a began spreading >> up his right am and seemed to spread to what ever part he touched but now all has lesions almost everywhere on his body. He states the rash is pruritic . In the past couple of weeks as well he developed increased swelling in the RUE, and the skin appeared to be peeling away on that arm and it has been weeping. Pt states PCP referred him to dermatology, but earliest appt for NOV 28TH. He admits to pain in R. Arm. He denies fevers, chest pain and no SOB. He admits to leg swelling but states that it is longstanding and unchanged except for some weeping  he began having in R. Leg. He denies having any similar rash in the past, and also denies been out in the grass/woods or any new detergents/soaps. He was seen in the ED and CBC and chemistries unremarkable, [facility] consulted for admission.   Hospital Course:   ?Cellulitis RUE  -Doubt cellulitis; believe this is more weeping from edema from RUE DVT on top of his baseline skin condition.  -Will not be discharged on any antibiotics.  RUE DVT  -Has been started on xarelto.  -Have given him a prescription for 15 mg daily for the first 21 days, after which he will need to be transitioned to 20 mg daily.  Rash/Skin Lesions -Unsure of etiology but suspicious for psoriases.  -Already has a dermatology appointment scheduled for later this month.   DM 2  -Well controlled.  -Continue current regimen.   Gout  -No current pain that would appear to be gouty in nature.   Chronic Diastolic CHF  -Compensated.   HTN  -Well controlled.   Hypokalemia  -Resolved. -Mg ok at 2.0.      Procedures:  None   Consultations:  None  Discharge Instructions      Discharge Orders   Future Orders Complete By Expires   Diet - low sodium heart healthy  As directed    Discontinue IV  As directed    Increase activity slowly  As directed        Medication List    STOP taking these medications  aspirin 325 MG tablet     clopidogrel 75 MG tablet  Commonly known as:  PLAVIX     indomethacin 25 MG capsule  Commonly known as:  INDOCIN      TAKE these medications       atorvastatin 40 MG tablet  Commonly known as:  LIPITOR  Take 40 mg by mouth daily.     cloNIDine 0.1 MG tablet  Commonly known as:  CATAPRES  Take 0.1 mg by mouth 2 (two) times daily.     furosemide 80 MG tablet  Commonly known as:  LASIX  Take 80 mg by mouth 2 (two) times daily.     hydrocortisone cream 1 %  Apply 1 application topically 2 (two) times daily.     hydrOXYzine 25 MG tablet  Commonly known as:   ATARAX/VISTARIL  Take 1 tablet (25 mg total) by mouth 3 (three) times daily as needed for itching (refractory to benadryl).     insulin NPH-regular (70-30) 100 UNIT/ML injection  Commonly known as:  NOVOLIN 70/30  Inject into the skin. 52 units in the morning and 32 units in the evening     losartan-hydrochlorothiazide 100-12.5 MG per tablet  Commonly known as:  HYZAAR  Take 1 tablet by mouth daily.     metFORMIN 1000 MG tablet  Commonly known as:  GLUCOPHAGE  Take 1,000 mg by mouth 2 (two) times daily with a meal.     metoprolol tartrate 25 MG tablet  Commonly known as:  LOPRESSOR  Take 25 mg by mouth 2 (two) times daily.     Rivaroxaban 15 MG Tabs tablet  Commonly known as:  XARELTO  Take 1 tablet (15 mg total) by mouth 2 (two) times daily with a meal.       Allergies  Allergen Reactions  . Iohexol     hives  . Ivp Dye [Iodinated Diagnostic Agents]     hives   Follow-up Information   Follow up with Lolita Patella, MD. (as scheduled for next week.)    Specialty:  Family Medicine   Contact information:   [address] [place] [address] [phone]       Please follow up. (With dermatology as scheduled for 01/23/2013.)        The results of significant diagnostics from this hospitalization (including imaging, microbiology, ancillary and laboratory) are listed below for reference.    Significant Diagnostic Studies: Dg Hand 2 View Right  12/29/2012   CLINICAL DATA:  57 year old male with right hand pain and stiffness. No known injury.  EXAM: RIGHT HAND - 2 VIEW  COMPARISON:  None.  FINDINGS: Distal radius and ulna intact. Carpal bone alignment within normal limits. Metacarpals and phalanges intact. Degenerative changes at the right common MCP joint with joint space loss, subchondral sclerosis and osteophytosis. Mild degenerative changes elsewhere.  IMPRESSION: No acute osseous abnormality identified in the right hand. Osteoarthritis at the  right thumb MCP joint.   Electronically Signed   By: Augusto Gamble M.D.   On: 12/29/2012 19:29    Microbiology: No results found for this or any previous visit (from the past 240 hour(s)).   Labs: Basic Metabolic Panel:  Recent Labs Lab 12/29/12 1240 12/30/12 0510 12/31/12 0530 01/01/13 0535  NA 144 145 142 144  K 4.2 3.0* 3.4* 3.7  CL 106 108 105 105  CO2 29 29 29 29   GLUCOSE 94 79 165* 160*  BUN 15 11 12 10   CREATININE 1.05 1.00  1.15 1.07  CALCIUM 9.0 8.3* 8.8 8.9  MG  --   --  2.0  --    Liver Function Tests: No results found for this basename: AST, ALT, ALKPHOS, BILITOT, PROT, ALBUMIN,  in the last 168 hours No results found for this basename: LIPASE, AMYLASE,  in the last 168 hours No results found for this basename: AMMONIA,  in the last 168 hours CBC:  Recent Labs Lab 12/29/12 1240 12/30/12 0510 12/31/12 0530 01/01/13 0535  WBC 9.9 7.3 6.7 8.3  HGB 12.4* 11.3* 10.4* 10.6*  HCT 39.5 35.5* 33.5* 34.2*  MCV 88.0 87.9 87.9 87.5  PLT 327 300 270 286   Cardiac Enzymes: No results found for this basename: CKTOTAL, CKMB, CKMBINDEX, TROPONINI,  in the last 168 hours BNP: BNP (last 3 results)  Recent Labs  10/12/12 2120 12/30/12 0510  PROBNP 386.7* 286.8*   CBG:  Recent Labs Lab 12/31/12 0725 12/31/12 1137 12/31/12 1627 12/31/12 2122 01/01/13 0712  GLUCAP 140* 164* 87 124* 153*       Signed:  HERNANDEZ ACOSTA,ESTELA  [facility] Pager: [phone] 01/01/2013, 10:39 AM

## 2013-01-01 NOTE — Progress Notes (Signed)
Discharge instructions given to pt, verbalized understanding. Left the unit in stable condition. 

## 2013-01-05 DIAGNOSIS — E785 Hyperlipidemia, unspecified: Secondary | ICD-10-CM | POA: Diagnosis not present

## 2013-01-05 DIAGNOSIS — I10 Essential (primary) hypertension: Secondary | ICD-10-CM | POA: Diagnosis not present

## 2013-01-05 DIAGNOSIS — Z23 Encounter for immunization: Secondary | ICD-10-CM | POA: Diagnosis not present

## 2013-01-05 DIAGNOSIS — IMO0001 Reserved for inherently not codable concepts without codable children: Secondary | ICD-10-CM | POA: Diagnosis not present

## 2013-01-05 DIAGNOSIS — I82409 Acute embolism and thrombosis of unspecified deep veins of unspecified lower extremity: Secondary | ICD-10-CM | POA: Diagnosis not present

## 2013-01-27 DIAGNOSIS — L738 Other specified follicular disorders: Secondary | ICD-10-CM | POA: Diagnosis not present

## 2013-01-27 DIAGNOSIS — I872 Venous insufficiency (chronic) (peripheral): Secondary | ICD-10-CM | POA: Diagnosis not present

## 2013-01-27 DIAGNOSIS — L259 Unspecified contact dermatitis, unspecified cause: Secondary | ICD-10-CM | POA: Diagnosis not present

## 2013-02-17 DIAGNOSIS — I872 Venous insufficiency (chronic) (peripheral): Secondary | ICD-10-CM | POA: Diagnosis not present

## 2013-09-04 ENCOUNTER — Inpatient Hospital Stay (HOSPITAL_COMMUNITY)
Admission: EM | Admit: 2013-09-04 | Discharge: 2013-09-10 | DRG: 872 | Disposition: A | Discharge status: Home / Self Care | Payer: Medicare Other | Attending: Internal Medicine | Admitting: Internal Medicine

## 2013-09-04 ENCOUNTER — Encounter (HOSPITAL_COMMUNITY): Payer: Self-pay | Admitting: Emergency Medicine

## 2013-09-04 ENCOUNTER — Emergency Department (HOSPITAL_COMMUNITY): Payer: Medicare Other

## 2013-09-04 DIAGNOSIS — Z79899 Other long term (current) drug therapy: Secondary | ICD-10-CM

## 2013-09-04 DIAGNOSIS — Z7901 Long term (current) use of anticoagulants: Secondary | ICD-10-CM | POA: Diagnosis not present

## 2013-09-04 DIAGNOSIS — Z86718 Personal history of other venous thrombosis and embolism: Secondary | ICD-10-CM | POA: Diagnosis present

## 2013-09-04 DIAGNOSIS — I82409 Acute embolism and thrombosis of unspecified deep veins of unspecified lower extremity: Secondary | ICD-10-CM

## 2013-09-04 DIAGNOSIS — Z794 Long term (current) use of insulin: Secondary | ICD-10-CM | POA: Diagnosis not present

## 2013-09-04 DIAGNOSIS — I10 Essential (primary) hypertension: Secondary | ICD-10-CM | POA: Diagnosis present

## 2013-09-04 DIAGNOSIS — L02419 Cutaneous abscess of limb, unspecified: Secondary | ICD-10-CM | POA: Diagnosis not present

## 2013-09-04 DIAGNOSIS — I509 Heart failure, unspecified: Secondary | ICD-10-CM | POA: Diagnosis present

## 2013-09-04 DIAGNOSIS — I5032 Chronic diastolic (congestive) heart failure: Secondary | ICD-10-CM | POA: Diagnosis present

## 2013-09-04 DIAGNOSIS — L988 Other specified disorders of the skin and subcutaneous tissue: Secondary | ICD-10-CM | POA: Diagnosis present

## 2013-09-04 DIAGNOSIS — Z833 Family history of diabetes mellitus: Secondary | ICD-10-CM

## 2013-09-04 DIAGNOSIS — Z91041 Radiographic dye allergy status: Secondary | ICD-10-CM

## 2013-09-04 DIAGNOSIS — IMO0002 Reserved for concepts with insufficient information to code with codable children: Secondary | ICD-10-CM

## 2013-09-04 DIAGNOSIS — Z803 Family history of malignant neoplasm of breast: Secondary | ICD-10-CM | POA: Diagnosis not present

## 2013-09-04 DIAGNOSIS — L97809 Non-pressure chronic ulcer of other part of unspecified lower leg with unspecified severity: Secondary | ICD-10-CM | POA: Diagnosis present

## 2013-09-04 DIAGNOSIS — E2749 Other adrenocortical insufficiency: Secondary | ICD-10-CM | POA: Diagnosis present

## 2013-09-04 DIAGNOSIS — I2510 Atherosclerotic heart disease of native coronary artery without angina pectoris: Secondary | ICD-10-CM | POA: Diagnosis present

## 2013-09-04 DIAGNOSIS — Z8249 Family history of ischemic heart disease and other diseases of the circulatory system: Secondary | ICD-10-CM | POA: Diagnosis not present

## 2013-09-04 DIAGNOSIS — E1165 Type 2 diabetes mellitus with hyperglycemia: Secondary | ICD-10-CM | POA: Diagnosis present

## 2013-09-04 DIAGNOSIS — I252 Old myocardial infarction: Secondary | ICD-10-CM | POA: Diagnosis not present

## 2013-09-04 DIAGNOSIS — E1149 Type 2 diabetes mellitus with other diabetic neurological complication: Secondary | ICD-10-CM | POA: Diagnosis present

## 2013-09-04 DIAGNOSIS — G609 Hereditary and idiopathic neuropathy, unspecified: Secondary | ICD-10-CM | POA: Diagnosis not present

## 2013-09-04 DIAGNOSIS — L03119 Cellulitis of unspecified part of limb: Secondary | ICD-10-CM

## 2013-09-04 DIAGNOSIS — E7800 Pure hypercholesterolemia, unspecified: Secondary | ICD-10-CM | POA: Diagnosis present

## 2013-09-04 DIAGNOSIS — E1142 Type 2 diabetes mellitus with diabetic polyneuropathy: Secondary | ICD-10-CM | POA: Diagnosis present

## 2013-09-04 DIAGNOSIS — Z800 Family history of malignant neoplasm of digestive organs: Secondary | ICD-10-CM | POA: Diagnosis not present

## 2013-09-04 DIAGNOSIS — I428 Other cardiomyopathies: Secondary | ICD-10-CM | POA: Diagnosis present

## 2013-09-04 DIAGNOSIS — IMO0001 Reserved for inherently not codable concepts without codable children: Secondary | ICD-10-CM | POA: Diagnosis not present

## 2013-09-04 DIAGNOSIS — M7989 Other specified soft tissue disorders: Secondary | ICD-10-CM | POA: Diagnosis not present

## 2013-09-04 DIAGNOSIS — A419 Sepsis, unspecified organism: Secondary | ICD-10-CM | POA: Diagnosis not present

## 2013-09-04 DIAGNOSIS — M79609 Pain in unspecified limb: Secondary | ICD-10-CM | POA: Diagnosis not present

## 2013-09-04 DIAGNOSIS — M109 Gout, unspecified: Secondary | ICD-10-CM | POA: Diagnosis present

## 2013-09-04 DIAGNOSIS — A401 Sepsis due to streptococcus, group B: Secondary | ICD-10-CM

## 2013-09-04 DIAGNOSIS — H5440 Blindness, one eye, unspecified eye: Secondary | ICD-10-CM | POA: Diagnosis present

## 2013-09-04 DIAGNOSIS — G629 Polyneuropathy, unspecified: Secondary | ICD-10-CM

## 2013-09-04 DIAGNOSIS — R6510 Systemic inflammatory response syndrome (SIRS) of non-infectious origin without acute organ dysfunction: Secondary | ICD-10-CM

## 2013-09-04 DIAGNOSIS — R509 Fever, unspecified: Secondary | ICD-10-CM | POA: Diagnosis not present

## 2013-09-04 DIAGNOSIS — A409 Streptococcal sepsis, unspecified: Principal | ICD-10-CM | POA: Diagnosis present

## 2013-09-04 DIAGNOSIS — L03115 Cellulitis of right lower limb: Secondary | ICD-10-CM

## 2013-09-04 HISTORY — DX: Unspecified osteoarthritis, unspecified site: M19.90

## 2013-09-04 LAB — COMPREHENSIVE METABOLIC PANEL
ALK PHOS: 79 U/L (ref 39–117)
ALT: 14 U/L (ref 0–53)
AST: 20 U/L (ref 0–37)
Albumin: 3.9 g/dL (ref 3.5–5.2)
Anion gap: 14 (ref 5–15)
BUN: 10 mg/dL (ref 6–23)
CHLORIDE: 98 meq/L (ref 96–112)
CO2: 28 mEq/L (ref 19–32)
Calcium: 9.5 mg/dL (ref 8.4–10.5)
Creatinine, Ser: 0.95 mg/dL (ref 0.50–1.35)
GFR calc Af Amer: >90 mL/min (ref >90)
GFR calc non Af Amer: >90 mL/min (ref >90)
Glucose, Bld: 159 mg/dL — ABNORMAL HIGH (ref 70–99)
Potassium: 3.7 mEq/L (ref 3.7–5.3)
Sodium: 140 mEq/L (ref 137–147)
Total Bilirubin: 0.5 mg/dL (ref 0.3–1.2)
Total Protein: 8.5 g/dL — ABNORMAL HIGH (ref 6.0–8.3)

## 2013-09-04 LAB — CBC WITH DIFFERENTIAL/PLATELET
BASOS ABS: 0 10*3/uL (ref 0.0–0.1)
Basophils Relative: 0 % (ref 0–1)
Eosinophils Absolute: 0.1 10*3/uL (ref 0.0–0.7)
Eosinophils Relative: 1 % (ref 0–5)
HEMATOCRIT: 43.9 % (ref 39.0–52.0)
Hemoglobin: 14.5 g/dL (ref 13.0–17.0)
LYMPHS ABS: 1.1 10*3/uL (ref 0.7–4.0)
Lymphocytes Relative: 7 % — ABNORMAL LOW (ref 12–46)
MCH: 28.6 pg (ref 26.0–34.0)
MCHC: 33 g/dL (ref 30.0–36.0)
MCV: 86.6 fL (ref 78.0–100.0)
Monocytes Absolute: 0.9 10*3/uL (ref 0.1–1.0)
Monocytes Relative: 6 % (ref 3–12)
NEUTROS ABS: 13.3 10*3/uL — AB (ref 1.7–7.7)
Neutrophils Relative %: 86 % — ABNORMAL HIGH (ref 43–77)
PLATELETS: 252 10*3/uL (ref 150–400)
RBC: 5.07 MIL/uL (ref 4.22–5.81)
RDW: 12.7 % (ref 11.5–15.5)
WBC: 15.5 10*3/uL — AB (ref 4.0–10.5)

## 2013-09-04 LAB — CBG MONITORING, ED: GLUCOSE-CAPILLARY: 160 mg/dL — AB (ref 70–99)

## 2013-09-04 LAB — LIPASE, BLOOD: Lipase: 24 U/L (ref 11–59)

## 2013-09-04 LAB — I-STAT CG4 LACTIC ACID, ED: Lactic Acid, Venous: 2.55 mmol/L — ABNORMAL HIGH (ref 0.5–2.2)

## 2013-09-04 MED ORDER — ACETAMINOPHEN 325 MG PO TABS
650.0000 mg | ORAL_TABLET | Freq: Once | ORAL | Status: AC
  Administered 2013-09-04: 650 mg via ORAL
  Filled 2013-09-04: qty 2

## 2013-09-04 MED ORDER — SODIUM CHLORIDE 0.9 % IV SOLN
Freq: Once | INTRAVENOUS | Status: AC
  Administered 2013-09-05: via INTRAVENOUS

## 2013-09-04 NOTE — ED Notes (Signed)
Pt c/o body aches, chills and emesis onset 1100 today. abd pain, denies diarrhea. Did take Pepto Bismol 2130.

## 2013-09-04 NOTE — ED Provider Notes (Signed)
CSN: [id number]     Arrival date & time 09/04/13  2237 History   First MD Initiated Contact with Patient 09/04/13 2303     Chief Complaint  Patient presents with  . Emesis  . Fever     (Consider location/radiation/quality/duration/timing/severity/associated sxs/prior Treatment) HPI Comments: 58 year old male with a history of hypertension, diabetes, hypercholesterolemia as well as congestive heart failure, adrenal insufficiency, peripheral vascular disease cardiac disease status post multiple stents on anticoagulant therapy.  He presents with a complaint of fevers and chills. He states that this started approximately 11:00 this morning, over the last 12 hours this has been persistent, gradually worsening but he denies any associated coughing, chest pain, back pain and has had no diarrhea, dysuria, hematuria, rectal bleeding, sore throat. He has had some swelling of his lower extremities right greater than left and has had some drainage from a wound of his mid anterior left lower extremity below the knee. The symptoms are persistent, nothing seems to make this better or worse.  Patient is a 58 y.o. male presenting with vomiting and fever. The history is provided by the patient and a relative.  Emesis Fever Associated symptoms: vomiting     Past Medical History  Diagnosis Date  . Blind left eye   . Hypertension   . Diabetes mellitus without complication   . Gout   . Herpes   . CHF (congestive heart failure)   . Cardiomyopathy   . Hypercholesterolemia   . MI (myocardial infarction)   . Pneumonia   . Adrenal insufficiency   . Peripheral neuropathy   . Carpal tunnel syndrome   . PVD (peripheral vascular disease)   . ED (erectile dysfunction)   . Allergy   . Cataract     bil removed  . Arthritis   . Gout    Past Surgical History  Procedure Laterality Date  . Cholecystectomy    . Stents    . Dilatera cataracts removed    . Right knee surgary     Family History  Problem  Relation Age of Onset  . Breast cancer Mother   . Hypertension Father     blood clot in leg  . Diabetes Maternal Aunt   . Heart attack Sister   . Colon cancer Neg Hx   . Esophageal cancer Neg Hx   . Rectal cancer Neg Hx   . Stomach cancer Neg Hx    History  Substance Use Topics  . Smoking status: Never Smoker   . Smokeless tobacco: Never Used  . Alcohol Use: No    Review of Systems  Constitutional: Positive for fever.  Gastrointestinal: Positive for vomiting.  All other systems reviewed and are negative.     Allergies  Iohexol and Ivp dye  Home Medications   Prior to Admission medications   Medication Sig Start Date End Date Taking? Authorizing Provider  furosemide (LASIX) 80 MG tablet Take 80 mg by mouth 2 (two) times daily.   Yes Historical Provider, MD  insulin NPH-regular (NOVOLIN 70/30) (70-30) 100 UNIT/ML injection Inject into the skin. 52 units in the morning and 32 units in the evening   Yes Historical Provider, MD  losartan-hydrochlorothiazide (HYZAAR) 100-12.5 MG per tablet Take 1 tablet by mouth daily.   Yes Historical Provider, MD  metFORMIN (GLUCOPHAGE) 1000 MG tablet Take 1,000 mg by mouth 2 (two) times daily with a meal.   Yes Historical Provider, MD  metoprolol tartrate (LOPRESSOR) 25 MG tablet Take 25 mg by mouth 2 (two)  times daily.   Yes Historical Provider, MD  rivaroxaban (XARELTO) 20 MG TABS tablet Take 20 mg by mouth daily.   Yes Historical Provider, MD  triamcinolone ointment (KENALOG) 0.1 % Apply 1 application topically 2 (two) times daily.   Yes Historical Provider, MD  atorvastatin (LIPITOR) 40 MG tablet Take 40 mg by mouth daily.    Historical Provider, MD  cloNIDine (CATAPRES) 0.1 MG tablet Take 0.1 mg by mouth 2 (two) times daily.    Historical Provider, MD  hydrOXYzine (ATARAX/VISTARIL) 25 MG tablet Take 1 tablet (25 mg total) by mouth 3 (three) times daily as needed for itching (refractory to benadryl). 01/01/13   Erline Hau, MD    BP 168/64  Pulse 84  Temp(Src) 100 F (37.8 C) (Oral)  Resp 18  Ht 5\' 9"  (1.753 m)  Wt 265 lb (120.203 kg)  BMI 39.12 kg/m2  SpO2 95% Physical Exam  Nursing note and vitals reviewed. Constitutional: He appears well-developed and well-nourished. No distress.  HENT:  Head: Normocephalic and atraumatic.  Mouth/Throat: Oropharynx is clear and moist. No oropharyngeal exudate.  Eyes: Conjunctivae and EOM are normal. Pupils are equal, round, and reactive to light. Right eye exhibits no discharge. Left eye exhibits no discharge. No scleral icterus.  Neck: Normal range of motion. Neck supple. No JVD present. No thyromegaly present.  Cardiovascular: Regular rhythm, normal heart sounds and intact distal pulses.  Exam reveals no gallop and no friction rub.   No murmur heard. Tachycardia present, strong pulses at the radial arteries  Pulmonary/Chest: Effort normal and breath sounds normal. No respiratory distress. He has no wheezes. He has no rales.  Abdominal: Soft. Bowel sounds are normal. He exhibits no distension and no mass. There is no tenderness.  Soft nontender abdomen  Musculoskeletal: Normal range of motion. He exhibits edema. He exhibits no tenderness.   Edema, pitting, associated with erythema and warmth of the right lower extremity and a draining wound of the left lower extremity as below. Other than right ankle with swollen lower extremity peripheral edema there does not appear to be any other joint tenderness or swelling of the extremities  Lymphadenopathy:    He has no cervical adenopathy.  Neurological: He is alert. Coordination normal.  Speech is clear, cranial nerves III through XII are intact, able to lift both legs off the bed without difficulty, coordination is normal, memory is intact  Skin: Skin is warm and dry. Rash noted. There is erythema.  Bilateral lower extremities with slight erythematous appearance, skin on the right lower extremity is darkened and warmth compared  to the left, left lower extremity with small scab and draining pustulant material below, no surrounding erythema, induration. Minimal tenderness.  Psychiatric: He has a normal mood and affect. His behavior is normal.    ED Course  Procedures (including critical care time) Labs Review Labs Reviewed  CBC WITH DIFFERENTIAL - Abnormal; Notable for the following:    WBC 15.5 (*)    Neutrophils Relative % 86 (*)    Neutro Abs 13.3 (*)    Lymphocytes Relative 7 (*)    All other components within normal limits  COMPREHENSIVE METABOLIC PANEL - Abnormal; Notable for the following:    Glucose, Bld 159 (*)    Total Protein 8.5 (*)    All other components within normal limits  URINALYSIS, ROUTINE W REFLEX MICROSCOPIC - Abnormal; Notable for the following:    Hgb urine dipstick SMALL (*)    Protein, ur 100 (*)  All other components within normal limits  CBG MONITORING, ED - Abnormal; Notable for the following:    Glucose-Capillary 160 (*)    All other components within normal limits  I-STAT CG4 LACTIC ACID, ED - Abnormal; Notable for the following:    Lactic Acid, Venous 2.55 (*)    All other components within normal limits  URINE CULTURE  LIPASE, BLOOD  URINE MICROSCOPIC-ADD ON  CBG MONITORING, ED    Imaging Review Dg Tibia/fibula Left  09/04/2013   CLINICAL DATA:  Abscess left mid tib-fib.  EXAM: LEFT TIBIA AND FIBULA - 2 VIEW  COMPARISON:  None.  FINDINGS: No radiopaque foreign body or soft tissue gas collections identified at the area marked as the location of the abscess. No underlying bone changes. No evidence of acute fracture or dislocation. Degenerative changes in the left knee. Vascular calcifications are present.  IMPRESSION: No acute bony abnormalities. Soft tissues are unremarkable except for vascular calcifications.   Electronically Signed   By: Lucienne Capers M.D.   On: 09/04/2013 23:45      MDM   Final diagnoses:  Cellulitis of right lower extremity  SIRS (systemic  inflammatory response syndrome)   Blood pressure is significantly elevated at 184/78. It appears that the patient has a febrile illness, etiology is the patient is uncomfortable appearing with a febrile illness, the etiology is unclear as the patient has no focus of infection other than the skin of his lower extremities. There is concern for cellulitis of the right lower extremity and the small abscess of the left lower extremity. We'll evaluate for osteomyelitis with x-ray, labs, antipyretics have been given, IV fluids administered  Care was discussed with the hospitalist regarding the patient's symptoms. He has a likely cellulitis, systemic inflammatory response syndrome is present, the patient was not hypotensive, he was given antipyretics, he did not appear unstable during his admission but will require IV antibiotics and admission to the hospital.  Discussed with Dr. Alcario Drought who is in agreement with admission   Meds given in ED:  Medications  vancomycin (VANCOCIN) IVPB 1000 mg/200 mL premix (not administered)  atorvastatin (LIPITOR) tablet 40 mg (not administered)  cloNIDine (CATAPRES) tablet 0.1 mg (not administered)  furosemide (LASIX) tablet 80 mg (not administered)  hydrOXYzine (ATARAX/VISTARIL) tablet 25 mg (not administered)  insulin aspart protamine- aspart (NOVOLOG MIX 70/30) injection 32-52 Units (not administered)  losartan-hydrochlorothiazide (HYZAAR) 100-12.5 MG per tablet 1 tablet (not administered)  metFORMIN (GLUCOPHAGE) tablet 1,000 mg (not administered)  metoprolol tartrate (LOPRESSOR) tablet 25 mg (not administered)  rivaroxaban (XARELTO) tablet 20 mg (not administered)  acetaminophen (TYLENOL) tablet 650 mg (650 mg Oral Given 09/04/13 2308)  0.9 %  sodium chloride infusion ( Intravenous Stopped 09/05/13 0248)    New Prescriptions   No medications on file      Johnna Acosta, MD 09/05/13 [phone]

## 2013-09-05 DIAGNOSIS — IMO0001 Reserved for inherently not codable concepts without codable children: Secondary | ICD-10-CM

## 2013-09-05 DIAGNOSIS — L02419 Cutaneous abscess of limb, unspecified: Secondary | ICD-10-CM

## 2013-09-05 DIAGNOSIS — R6510 Systemic inflammatory response syndrome (SIRS) of non-infectious origin without acute organ dysfunction: Secondary | ICD-10-CM | POA: Diagnosis not present

## 2013-09-05 DIAGNOSIS — L03119 Cellulitis of unspecified part of limb: Secondary | ICD-10-CM

## 2013-09-05 DIAGNOSIS — E1165 Type 2 diabetes mellitus with hyperglycemia: Secondary | ICD-10-CM

## 2013-09-05 LAB — URINALYSIS, ROUTINE W REFLEX MICROSCOPIC
Bilirubin Urine: NEGATIVE
GLUCOSE, UA: NEGATIVE mg/dL
KETONES UR: NEGATIVE mg/dL
LEUKOCYTES UA: NEGATIVE
Nitrite: NEGATIVE
PH: 7.5 (ref 5.0–8.0)
Protein, ur: 100 mg/dL — AB
Specific Gravity, Urine: 1.015 (ref 1.005–1.030)
Urobilinogen, UA: 1 mg/dL (ref 0.0–1.0)

## 2013-09-05 LAB — URINE MICROSCOPIC-ADD ON

## 2013-09-05 LAB — HEMOGLOBIN A1C
HEMOGLOBIN A1C: 6.6 % — AB (ref <5.7)
Mean Plasma Glucose: 143 mg/dL — ABNORMAL HIGH (ref <117)

## 2013-09-05 LAB — GLUCOSE, CAPILLARY
Glucose-Capillary: 178 mg/dL — ABNORMAL HIGH (ref 70–99)
Glucose-Capillary: 94 mg/dL (ref 70–99)
Glucose-Capillary: 44 mg/dL — CL (ref 70–99)
Glucose-Capillary: 70 mg/dL (ref 70–99)

## 2013-09-05 MED ORDER — INSULIN ASPART PROT & ASPART (70-30 MIX) 100 UNIT/ML ~~LOC~~ SUSP
52.0000 [IU] | Freq: Every day | SUBCUTANEOUS | Status: DC
  Administered 2013-09-05 – 2013-09-10 (×6): 52 [IU] via SUBCUTANEOUS
  Filled 2013-09-05 (×2): qty 10

## 2013-09-05 MED ORDER — LOSARTAN POTASSIUM-HCTZ 100-12.5 MG PO TABS: 1.0000 | ORAL_TABLET | Freq: Every day | ORAL | Status: DC

## 2013-09-05 MED ORDER — CLONIDINE HCL 0.1 MG PO TABS
0.1000 mg | ORAL_TABLET | Freq: Two times a day (BID) | ORAL | Status: DC
  Administered 2013-09-05 – 2013-09-07 (×6): 0.1 mg via ORAL
  Filled 2013-09-05 (×7): qty 1

## 2013-09-05 MED ORDER — INSULIN ASPART 100 UNIT/ML ~~LOC~~ SOLN
0.0000 [IU] | Freq: Three times a day (TID) | SUBCUTANEOUS | Status: DC
  Administered 2013-09-06 – 2013-09-07 (×3): 1 [IU] via SUBCUTANEOUS
  Administered 2013-09-07 (×2): 2 [IU] via SUBCUTANEOUS
  Administered 2013-09-08 – 2013-09-09 (×3): 1 [IU] via SUBCUTANEOUS
  Administered 2013-09-09: 2 [IU] via SUBCUTANEOUS
  Administered 2013-09-10: 1 [IU] via SUBCUTANEOUS

## 2013-09-05 MED ORDER — VANCOMYCIN HCL IN DEXTROSE 1-5 GM/200ML-% IV SOLN
1000.0000 mg | Freq: Two times a day (BID) | INTRAVENOUS | Status: DC
  Administered 2013-09-05 – 2013-09-07 (×5): 1000 mg via INTRAVENOUS
  Filled 2013-09-05 (×6): qty 200

## 2013-09-05 MED ORDER — HYDRALAZINE HCL 10 MG PO TABS
10.0000 mg | ORAL_TABLET | Freq: Four times a day (QID) | ORAL | Status: DC | PRN
  Administered 2013-09-05 – 2013-09-07 (×4): 10 mg via ORAL
  Filled 2013-09-05 (×4): qty 1

## 2013-09-05 MED ORDER — ACETAMINOPHEN 325 MG PO TABS
650.0000 mg | ORAL_TABLET | Freq: Four times a day (QID) | ORAL | Status: DC | PRN
  Administered 2013-09-05 – 2013-09-09 (×4): 650 mg via ORAL
  Filled 2013-09-05 (×4): qty 2

## 2013-09-05 MED ORDER — METFORMIN HCL 500 MG PO TABS
1000.0000 mg | ORAL_TABLET | Freq: Two times a day (BID) | ORAL | Status: DC
  Administered 2013-09-05 – 2013-09-10 (×11): 1000 mg via ORAL
  Filled 2013-09-05 (×13): qty 2

## 2013-09-05 MED ORDER — LOSARTAN POTASSIUM 50 MG PO TABS
100.0000 mg | ORAL_TABLET | Freq: Every day | ORAL | Status: DC
  Administered 2013-09-05 – 2013-09-10 (×6): 100 mg via ORAL
  Filled 2013-09-05 (×6): qty 2

## 2013-09-05 MED ORDER — ONDANSETRON HCL 4 MG/2ML IJ SOLN
4.0000 mg | Freq: Four times a day (QID) | INTRAMUSCULAR | Status: DC | PRN
  Administered 2013-09-05 – 2013-09-10 (×2): 4 mg via INTRAVENOUS
  Filled 2013-09-05 (×2): qty 2

## 2013-09-05 MED ORDER — ATORVASTATIN CALCIUM 40 MG PO TABS
40.0000 mg | ORAL_TABLET | Freq: Every day | ORAL | Status: DC
  Administered 2013-09-05 – 2013-09-09 (×5): 40 mg via ORAL
  Filled 2013-09-05 (×6): qty 1

## 2013-09-05 MED ORDER — FUROSEMIDE 80 MG PO TABS
80.0000 mg | ORAL_TABLET | Freq: Two times a day (BID) | ORAL | Status: DC
  Administered 2013-09-05 – 2013-09-10 (×11): 80 mg via ORAL
  Filled 2013-09-05 (×13): qty 1

## 2013-09-05 MED ORDER — METOPROLOL TARTRATE 25 MG PO TABS
25.0000 mg | ORAL_TABLET | Freq: Two times a day (BID) | ORAL | Status: DC
  Administered 2013-09-05 (×2): 25 mg via ORAL
  Filled 2013-09-05 (×3): qty 1

## 2013-09-05 MED ORDER — RIVAROXABAN 20 MG PO TABS
20.0000 mg | ORAL_TABLET | Freq: Every day | ORAL | Status: DC
  Administered 2013-09-05 – 2013-09-10 (×6): 20 mg via ORAL
  Filled 2013-09-05 (×6): qty 1

## 2013-09-05 MED ORDER — VANCOMYCIN HCL IN DEXTROSE 1-5 GM/200ML-% IV SOLN
1000.0000 mg | Freq: Once | INTRAVENOUS | Status: AC
  Administered 2013-09-05: 1000 mg via INTRAVENOUS
  Filled 2013-09-05: qty 200

## 2013-09-05 MED ORDER — HYDROCHLOROTHIAZIDE 12.5 MG PO CAPS
12.5000 mg | ORAL_CAPSULE | Freq: Every day | ORAL | Status: DC
  Administered 2013-09-05 – 2013-09-10 (×6): 12.5 mg via ORAL
  Filled 2013-09-05 (×7): qty 1

## 2013-09-05 MED ORDER — HYDROXYZINE HCL 25 MG PO TABS
25.0000 mg | ORAL_TABLET | Freq: Three times a day (TID) | ORAL | Status: DC | PRN
  Filled 2013-09-05: qty 1

## 2013-09-05 MED ORDER — INSULIN ASPART PROT & ASPART (70-30 MIX) 100 UNIT/ML ~~LOC~~ SUSP
32.0000 [IU] | Freq: Every day | SUBCUTANEOUS | Status: DC
  Administered 2013-09-05 – 2013-09-09 (×3): 32 [IU] via SUBCUTANEOUS
  Filled 2013-09-05: qty 10

## 2013-09-05 NOTE — Progress Notes (Signed)
Pt. bp on arrival to floor 215/99 order obtained to give hydralazine po. Med given as ordered.

## 2013-09-05 NOTE — Progress Notes (Signed)
ANTIBIOTIC CONSULT NOTE - INITIAL  Pharmacy Consult for Vancomycin Indication: Cellulitis  Allergies  Allergen Reactions  . Iohexol     hives  . Ivp Dye [Iodinated Diagnostic Agents]     hives    Patient Measurements: Height: 5\' 9"  (175.3 cm) Weight: 265 lb (120.203 kg) IBW/kg (Calculated) : 70.7 Adjusted Body Weight:   Vital Signs: Temp: 102.8 F (39.3 C) (07/11 0514) Temp src: Oral (07/11 0245) BP: 215/99 mmHg (07/11 0514) Pulse Rate: 96 (07/11 0514) Intake/Output from previous day:   Intake/Output from this shift:    Labs:  Recent Labs  09/04/13 2320  WBC 15.5*  HGB 14.5  PLT 252  CREATININE 0.95   Estimated Creatinine Clearance: 109.8 ml/min (by C-G formula based on Cr of 0.95). No results found for this basename: VANCOTROUGH, VANCOPEAK, VANCORANDOM, GENTTROUGH, GENTPEAK, GENTRANDOM, TOBRATROUGH, TOBRAPEAK, TOBRARND, AMIKACINPEAK, AMIKACINTROU, AMIKACIN,  in the last 72 hours   Microbiology: No results found for this or any previous visit (from the past 720 hour(s)).  Medical History: Past Medical History  Diagnosis Date  . Blind left eye   . Hypertension   . Diabetes mellitus without complication   . Gout   . Herpes   . CHF (congestive heart failure)   . Cardiomyopathy   . Hypercholesterolemia   . MI (myocardial infarction)   . Pneumonia   . Adrenal insufficiency   . Peripheral neuropathy   . Carpal tunnel syndrome   . PVD (peripheral vascular disease)   . ED (erectile dysfunction)   . Allergy   . Cataract     bil removed  . Arthritis   . Gout     Medications:  Anti-infectives   Start     Dose/Rate Route Frequency Ordered Stop   09/05/13 1000  vancomycin (VANCOCIN) IVPB 1000 mg/200 mL premix     1,000 mg 200 mL/hr over 60 Minutes Intravenous Every 12 hours 09/05/13 0559     09/05/13 0300  vancomycin (VANCOCIN) IVPB 1000 mg/200 mL premix     1,000 mg 200 mL/hr over 60 Minutes Intravenous  Once 09/05/13 0248 09/05/13 0448      Assessment: Patient with cellulitis.  First dose of antibiotics already given.   Goal of Therapy:  Vancomycin trough level 10-15 mcg/ml  Plan:  Measure antibiotic drug levels at steady state Follow up culture results Vancomycin 1gm iv q12hr  Nani Skillern Crowford 09/05/2013,6:01 AM

## 2013-09-05 NOTE — Progress Notes (Addendum)
Patient ID: [name], male   DOB: [birth year], 58 y.o.   MRN: [id number] [facility] PROGRESS NOTE  FARD BORUNDA JOA:[id number] DOB: Dec 13, 1955 DOA: 09/04/2013 PCP: [name], MD  Brief narrative: Addendum to admission note done 09/05/2013.  58 y.o. male with past medical history of  HTN, IDDM, CHF who presents to [facility] ED 09/04/2013 with fevers, chills and left leg ulcer on anterior shin draining pus. X ray on admission did not show osteomyelitis. He was started on vancomycin.   Assessment/Plan:  Active Problems:   Cellulitis and abscess of leg, ulcer on left shin, right LE swelling and cellulitis - appreciate WOC and recommendations - continue vancomycin; if spikes fever we may add zosyn for better coverage considering he is diabetic.   Hypertension - continue metoprolol, Hctz, clonidine   Diabetes - check A1c - hold metformin due to acute infection - start SSI   DVT prophylaxis: on therapeutic anticoagulation with xarelto   Code Status: full code  Family Communication: plan of care discussed with the patient Disposition Plan: home when stable   [name], MD  [facility] Pager [phone]  If 7PM-7AM, please contact night-coverage [email or web address] Password [email or web address] 09/05/2013, 3:44 PM   LOS: 1 day   Consultants:  Wound care  Procedures:  None   Antibiotics:  Vancomycin 09/04/2013 -->  HPI/Subjective: No acute overnight events.  Objective: Filed Vitals:   09/05/13 0445 09/05/13 0514 09/05/13 0710 09/05/13 1500  BP: 195/95 215/99 203/75 167/61  Pulse: 98 96  80  Temp:  102.8 F (39.3 C)  97.7 F (36.5 C)  TempSrc:    Oral  Resp: 20 20  18   Height:      Weight:      SpO2: 100% 97%  99%    Intake/Output Summary (Last 24 hours) at 09/05/13 1544 Last data filed at 09/05/13 1011  Gross per 24 hour  Intake      0 ml  Output    300 ml  Net   -300 ml    Exam:   General:  Pt is alert, follows commands appropriately, not in acute  distress  Cardiovascular: Regular rate and rhythm, S1/S2, no murmurs  Respiratory: Clear to auscultation bilaterally, no wheezing, no crackles, no rhonchi  Abdomen: Soft, non tender, non distended, bowel sounds present  Extremities: Left lower extremity ulcer not actively draining at this time, pulses DP and PT palpable bilateral; RLE swelling  Neuro: Grossly nonfocal  Data Reviewed: Basic Metabolic Panel:  Recent Labs Lab 09/04/13 2320  NA 140  K 3.7  CL 98  CO2 28  GLUCOSE 159*  BUN 10  CREATININE 0.95  CALCIUM 9.5   Liver Function Tests:  Recent Labs Lab 09/04/13 2320  AST 20  ALT 14  ALKPHOS 79  BILITOT 0.5  PROT 8.5*  ALBUMIN 3.9    Recent Labs Lab 09/04/13 2320  LIPASE 24   No results found for this basename: AMMONIA,  in the last 168 hours CBC:  Recent Labs Lab 09/04/13 2320  WBC 15.5*  NEUTROABS 13.3*  HGB 14.5  HCT 43.9  MCV 86.6  PLT 252   Cardiac Enzymes: No results found for this basename: CKTOTAL, CKMB, CKMBINDEX, TROPONINI,  in the last 168 hours BNP: No components found with this basename: POCBNP,  CBG:  Recent Labs Lab 09/04/13 2311 09/05/13 1149  GLUCAP 160* 178*    No results found for this or any previous visit (from the past 240 hour(s)).   Studies: Dg Tibia/fibula  Left 09/04/2013    IMPRESSION: No acute bony abnormalities. Soft tissues are unremarkable except for vascular calcifications.      Scheduled Meds: . atorvastatin  40 mg Oral q1800  . cloNIDine  0.1 mg Oral BID  . furosemide  80 mg Oral BID  . hydrochlorothiazide  12.5 mg Oral Daily  . insulin aspart protamine   32 Units Subcutaneous Q supper  . insulin aspart protamin  52 Units Subcutaneous QAC breakfast  . losartan  100 mg Oral Daily  . metFORMIN  1,000 mg Oral BID WC  . metoprolol tartrate  25 mg Oral BID  . rivaroxaban  20 mg Oral Daily  . vancomycin  1,000 mg Intravenous Q12H

## 2013-09-05 NOTE — H&P (Signed)
[facility] History and Physical  [name] GUR:[id number] DOB: April 06, 1955 DOA: 09/04/2013  Referring physician: EDP PCP: [name], MD   Chief Complaint: Fever, emesis   HPI: [name] is a 58 y.o. male with h/o HTN, IDDM, CHF, who presents to ED with c/o fever and chills.  Symptoms onset at 11 am this morning and have been gradually but persistently worsening over the past 12 hours.  No cough, chest pain, back pain, diarrhea, dysuria, sore throat, headache.  He has had some swelling of BLE R>L and drainage from a wound in his mid anterior left lower extremity on his shin.  At this point daughter chimes in and states that it was "draining pus earlier".  Review of Systems: Systems reviewed.  As above, otherwise negative  Past Medical History  Diagnosis Date  . Blind left eye   . Hypertension   . Diabetes mellitus without complication   . Gout   . Herpes   . CHF (congestive heart failure)   . Cardiomyopathy   . Hypercholesterolemia   . MI (myocardial infarction)   . Pneumonia   . Adrenal insufficiency   . Peripheral neuropathy   . Carpal tunnel syndrome   . PVD (peripheral vascular disease)   . ED (erectile dysfunction)   . Allergy   . Cataract     bil removed  . Arthritis   . Gout    Past Surgical History  Procedure Laterality Date  . Cholecystectomy    . Stents    . Dilatera cataracts removed    . Right knee surgary     Social History:  reports that he has never smoked. He has never used smokeless tobacco. He reports that he does not drink alcohol or use illicit drugs.  Allergies  Allergen Reactions  . Iohexol     hives  . Ivp Dye [Iodinated Diagnostic Agents]     hives    Family History  Problem Relation Age of Onset  . Breast cancer Mother   . Hypertension Father     blood clot in leg  . Diabetes Maternal Aunt   . Heart attack Sister   . Colon cancer Neg Hx   . Esophageal cancer Neg Hx   . Rectal cancer Neg Hx   . Stomach  cancer Neg Hx      Prior to Admission medications   Medication Sig Start Date End Date Taking? Authorizing Provider  furosemide (LASIX) 80 MG tablet Take 80 mg by mouth 2 (two) times daily.   Yes Historical Provider, MD  insulin NPH-regular (NOVOLIN 70/30) (70-30) 100 UNIT/ML injection Inject into the skin. 52 units in the morning and 32 units in the evening   Yes Historical Provider, MD  losartan-hydrochlorothiazide (HYZAAR) 100-12.5 MG per tablet Take 1 tablet by mouth daily.   Yes Historical Provider, MD  metFORMIN (GLUCOPHAGE) 1000 MG tablet Take 1,000 mg by mouth 2 (two) times daily with a meal.   Yes Historical Provider, MD  metoprolol tartrate (LOPRESSOR) 25 MG tablet Take 25 mg by mouth 2 (two) times daily.   Yes Historical Provider, MD  rivaroxaban (XARELTO) 20 MG TABS tablet Take 20 mg by mouth daily.   Yes Historical Provider, MD  triamcinolone ointment (KENALOG) 0.1 % Apply 1 application topically 2 (two) times daily.   Yes Historical Provider, MD  atorvastatin (LIPITOR) 40 MG tablet Take 40 mg by mouth daily.    Historical Provider, MD  cloNIDine (CATAPRES) 0.1 MG tablet Take 0.1 mg  by mouth 2 (two) times daily.    Historical Provider, MD  hydrOXYzine (ATARAX/VISTARIL) 25 MG tablet Take 1 tablet (25 mg total) by mouth 3 (three) times daily as needed for itching (refractory to benadryl). 01/01/13   Erline Hau, MD   Physical Exam: Filed Vitals:   09/05/13 0245  BP: 168/64  Pulse: 84  Temp: 100 F (37.8 C)  Resp: 18    BP 168/64  Pulse 84  Temp(Src) 100 F (37.8 C) (Oral)  Resp 18  Ht 5\' 9"  (1.753 m)  Wt 120.203 kg (265 lb)  BMI 39.12 kg/m2  SpO2 95%  General Appearance:    Alert, oriented, no distress, appears stated age  Head:    Normocephalic, atraumatic  Eyes:    PERRL, EOMI, sclera non-icteric        Nose:   Nares without drainage or epistaxis. Mucosa, turbinates normal  Throat:   Moist mucous membranes. Oropharynx without erythema or exudate.   Neck:   Supple. No carotid bruits.  No thyromegaly.  No lymphadenopathy.   Back:     No CVA tenderness, no spinal tenderness  Lungs:     Clear to auscultation bilaterally, without wheezes, rhonchi or rales  Chest wall:    No tenderness to palpitation  Heart:    Regular rate and rhythm without murmurs, gallops, rubs  Abdomen:     Soft, non-tender, nondistended, normal bowel sounds, no organomegaly  Genitalia:    deferred  Rectal:    deferred  Extremities:   BLE with slight erythematous appearance, skin on RLE foot darkened and warm compared to left, LLE has a small scab and appears to be draining a purulent material, there is surrounding erythema to the scab on my exam, tenderness is minimal.  Pulses:   2+ and symmetric all extremities  Skin:   Skin color, texture, turgor normal, no rashes or lesions  Lymph nodes:   Cervical, supraclavicular, and axillary nodes normal  Neurologic:   CNII-XII intact. Normal strength, sensation and reflexes      throughout    Labs on Admission:  Basic Metabolic Panel:  Recent Labs Lab 09/04/13 2320  NA 140  K 3.7  CL 98  CO2 28  GLUCOSE 159*  BUN 10  CREATININE 0.95  CALCIUM 9.5   Liver Function Tests:  Recent Labs Lab 09/04/13 2320  AST 20  ALT 14  ALKPHOS 79  BILITOT 0.5  PROT 8.5*  ALBUMIN 3.9    Recent Labs Lab 09/04/13 2320  LIPASE 24   No results found for this basename: AMMONIA,  in the last 168 hours CBC:  Recent Labs Lab 09/04/13 2320  WBC 15.5*  NEUTROABS 13.3*  HGB 14.5  HCT 43.9  MCV 86.6  PLT 252   Cardiac Enzymes: No results found for this basename: CKTOTAL, CKMB, CKMBINDEX, TROPONINI,  in the last 168 hours  BNP (last 3 results)  Recent Labs  10/12/12 2120 12/30/12 0510  PROBNP 386.7* 286.8*   CBG:  Recent Labs Lab 09/04/13 2311  GLUCAP 160*    Radiological Exams on Admission: Dg Tibia/fibula Left  09/04/2013   CLINICAL DATA:  Abscess left mid tib-fib.  EXAM: LEFT TIBIA AND FIBULA - 2  VIEW  COMPARISON:  None.  FINDINGS: No radiopaque foreign body or soft tissue gas collections identified at the area marked as the location of the abscess. No underlying bone changes. No evidence of acute fracture or dislocation. Degenerative changes in the left knee. Vascular calcifications are present.  IMPRESSION: No acute bony abnormalities. Soft tissues are unremarkable except for vascular calcifications.   Electronically Signed   By: Lucienne Capers M.D.   On: 09/04/2013 23:45    EKG: Independently reviewed.   Assessment/Plan Active Problems:   Cellulitis and abscess of leg   Diabetes type 2, uncontrolled   1. Cellulitis and abscess of leg - LLE has a scab that is draining purulent material with surrounding erythema, RLE foot is erythematous.  Wound care consult ordered, patient on vancomycin.  Admit to inpatient, blood culture ordered.  Minimal pain on legs but remainder of work up negative for source of infection at this time.  Tylenol for fever. 2. DM2 - continue patient on home metformin and 70/30 insulin, CBG checks AC/HS.    Code Status: Full Code  Family Communication: Daughter at bedside Disposition Plan: Admit to inpatient   Time spent: 82 min  [name] M. [facility] Pager [phone]  If 7AM-7PM, please contact the day team taking care of the patient [email or web address] Password [email or web address] 09/05/2013, 3:15 AM

## 2013-09-06 DIAGNOSIS — I10 Essential (primary) hypertension: Secondary | ICD-10-CM

## 2013-09-06 DIAGNOSIS — M79609 Pain in unspecified limb: Secondary | ICD-10-CM

## 2013-09-06 DIAGNOSIS — M7989 Other specified soft tissue disorders: Secondary | ICD-10-CM

## 2013-09-06 DIAGNOSIS — G609 Hereditary and idiopathic neuropathy, unspecified: Secondary | ICD-10-CM | POA: Diagnosis not present

## 2013-09-06 DIAGNOSIS — L02419 Cutaneous abscess of limb, unspecified: Secondary | ICD-10-CM | POA: Diagnosis not present

## 2013-09-06 LAB — URINE CULTURE: Colony Count: 45000

## 2013-09-06 LAB — VANCOMYCIN, TROUGH: VANCOMYCIN TR: 11.6 ug/mL (ref 10.0–20.0)

## 2013-09-06 LAB — GLUCOSE, CAPILLARY
GLUCOSE-CAPILLARY: 135 mg/dL — AB (ref 70–99)
GLUCOSE-CAPILLARY: 67 mg/dL — AB (ref 70–99)
Glucose-Capillary: 126 mg/dL — ABNORMAL HIGH (ref 70–99)
Glucose-Capillary: 129 mg/dL — ABNORMAL HIGH (ref 70–99)
Glucose-Capillary: 148 mg/dL — ABNORMAL HIGH (ref 70–99)
Glucose-Capillary: 63 mg/dL — ABNORMAL LOW (ref 70–99)
Glucose-Capillary: 68 mg/dL — ABNORMAL LOW (ref 70–99)

## 2013-09-06 MED ORDER — GLUCOSE 40 % PO GEL
ORAL | Status: AC
  Administered 2013-09-06: 37.5 g
  Filled 2013-09-06: qty 1

## 2013-09-06 NOTE — Progress Notes (Signed)
CRITICAL VALUE ALERT  Critical value received:  + Blood Culture areobic bottle  gram + cocci   Date of notification:  09/05/2013  Time of notification:  2134  Critical value read back:Yes.    Nurse who received alert:  Carver Fila RN   MD notified (1st page):  Kathline Magic,  NP  Time of first page:  2148  MD notified (2nd page):  Time of second page:  Responding MD:  Kathline Magic, NP  Time MD responded:  2150

## 2013-09-06 NOTE — Progress Notes (Signed)
Patient ID: [name], male   DOB: [birth year], 58 y.o.   MRN: [id number] [facility] PROGRESS NOTE  BERTRAN ZEIMET WUJ:[id number] DOB: 12-Feb-1956 DOA: 09/04/2013 PCP: [name], MD  Brief narrative: 58 y.o. male with past medical history of HTN, IDDM, CHF who presents to [facility] ED 09/04/2013 with fevers, chills and left leg ulcer on anterior shin draining pus. X ray on admission did not show osteomyelitis. He was started on vancomycin.   Assessment/Plan:   Active Problems:  Cellulitis and abscess of leg, ulcer on left shin, right LE swelling and cellulitis  - follow up on wound care consult and recommendations  - continue vancomycin for now - obtain doppler to rule out DVT Hypertension  - continue metoprolol, Hctz, clonidine  Diabetes  - A1c on this admission 6.6 indicating good glycemic control - continue current insulin regimen - continue metformin  H/O CAD and stent placement - on anticoagulation with xarelto - continue statin therapy, lasix, ARB's   DVT prophylaxis: on therapeutic anticoagulation with xarelto   Code Status: full code  Family Communication: plan of care discussed with the patient  Disposition Plan: home when stable   Consultants:  Wound care Procedures:  None  Antibiotics:  Vancomycin 09/04/2013 -->   [name], MD  [facility] Pager [phone]  If 7PM-7AM, please contact night-coverage [email or web address] Password [email or web address] 09/06/2013, 9:24 AM   LOS: 2 days    HPI/Subjective: No acute overnight events.  Objective: Filed Vitals:   09/06/13 0255 09/06/13 0615 09/06/13 0655 09/06/13 0701  BP: 164/62 169/65 199/81 176/80  Pulse: 67  65   Temp:   97.7 F (36.5 C)   TempSrc:   Oral   Resp:   18   Height:      Weight:      SpO2:   98%     Intake/Output Summary (Last 24 hours) at 09/06/13 0924 Last data filed at 09/06/13 0300  Gross per 24 hour  Intake    480 ml  Output   1300 ml  Net   -820 ml    Exam:   General:   Pt is alert, follows commands appropriately, not in acute distress  Cardiovascular: Regular rate and rhythm, S1/S2 apprecaited  Respiratory: Clear to auscultation bilaterally, no wheezing, no crackles, no rhonchi  Abdomen: Soft, non tender, non distended, bowel sounds present  Extremities: RLE cellulitis, LLE anterior shin small ulcera bout 1 cm in diameter not draining, pulses DP and PT palpable bilaterally  Neuro: Grossly nonfocal  Data Reviewed: Basic Metabolic Panel:  Recent Labs Lab 09/04/13 2320  NA 140  K 3.7  CL 98  CO2 28  GLUCOSE 159*  BUN 10  CREATININE 0.95  CALCIUM 9.5   Liver Function Tests:  Recent Labs Lab 09/04/13 2320  AST 20  ALT 14  ALKPHOS 79  BILITOT 0.5  PROT 8.5*  ALBUMIN 3.9    Recent Labs Lab 09/04/13 2320  LIPASE 24   No results found for this basename: AMMONIA,  in the last 168 hours CBC:  Recent Labs Lab 09/04/13 2320  WBC 15.5*  NEUTROABS 13.3*  HGB 14.5  HCT 43.9  MCV 86.6  PLT 252   Cardiac Enzymes: No results found for this basename: CKTOTAL, CKMB, CKMBINDEX, TROPONINI,  in the last 168 hours BNP: No components found with this basename: POCBNP,  CBG:  Recent Labs Lab 09/05/13 1614 09/05/13 2101 09/05/13 2158 09/06/13 0006 09/06/13 0704  GLUCAP 94 44* 70 129* 135*  CULTURE, BLOOD (ROUTINE X 2)     Status: None   Collection Time    09/05/13  3:27 AM      Result Value Ref Range Status   Specimen Description BLOOD LEFT ANTECUBITAL   Final   Value: GRAM POSITIVE COCCI IN PAIRS     Performed at [facility]   Report Status PENDING   Incomplete     Studies: Dg Tibia/fibula Left 09/04/2013    IMPRESSION: No acute bony abnormalities. Soft tissues are unremarkable except for vascular calcifications.   Electronically Signed   By: Lucienne Capers M.D.   On: 09/04/2013 23:45    Scheduled Meds: . atorvastatin  40 mg Oral q1800  . cloNIDine  0.1 mg Oral BID  . furosemide  80 mg Oral BID  .  hydrochlorothiazide  12.5 mg Oral Daily  . insulin aspart  0-9 Units Subcutaneous TID WC  . insulin aspart protamine- aspart  32 Units Subcutaneous Q supper  . insulin aspart protamine- aspart  52 Units Subcutaneous QAC breakfast  . losartan  100 mg Oral Daily  . metFORMIN  1,000 mg Oral BID WC  . rivaroxaban  20 mg Oral Daily  . vancomycin  1,000 mg Intravenous Q12H   Continuous Infusions:

## 2013-09-06 NOTE — Progress Notes (Signed)
VASCULAR LAB PRELIMINARY  PRELIMINARY  PRELIMINARY  PRELIMINARY  Right lower extremity venous duplex completed.    Preliminary report:  Right:  No evidence of DVT, superficial thrombosis, or Baker's cyst.  Brieann Osinski, RVS 09/06/2013, 10:21 AM

## 2013-09-06 NOTE — Progress Notes (Signed)
[name] CBG tonight was 62. Pt was asymptomatic and reports he did not  to order any dinner tray tonight. Give pt a  Sandwich tray with juice. CBG afterwards was 70 and later 129,  Educated pt about ways and means of ordering foods tray and the importance letting the nurse Know how much he ate. Pt verbalized understanding , will continue to monitor

## 2013-09-06 NOTE — Progress Notes (Signed)
ANTIBIOTIC CONSULT NOTE - FOLLOW UP  Pharmacy Consult for Vancomycin Indication: Cellulitis  Allergies  Allergen Reactions  . Iohexol     hives  . Ivp Dye [Iodinated Diagnostic Agents]     hives    Patient Measurements: Height: 5\' 9"  (175.3 cm) Weight: 265 lb (120.203 kg) IBW/kg (Calculated) : 70.7 Adjusted Body Weight:   Vital Signs: Temp: 97.3 F (36.3 C) (07/12 1500) Temp src: Oral (07/12 1500) BP: 157/75 mmHg (07/12 1500) Pulse Rate: 62 (07/12 1500) Intake/Output from previous day: 07/11 0701 - 07/12 0700 In: 720 [P.O.:720] Out: 1300 [Urine:1300] Intake/Output from this shift: Total I/O In: -  Out: 300 [Urine:300]  Labs:  Recent Labs  09/04/13 2320  WBC 15.5*  HGB 14.5  PLT 252  CREATININE 0.95   Estimated Creatinine Clearance: 109.8 ml/min (by C-G formula based on Cr of 0.95).  Recent Labs  09/06/13 2104  Ivey 11.6     Microbiology: Recent Results (from the past 720 hour(s))  CULTURE, BLOOD (ROUTINE X 2)     Status: None   Collection Time    09/04/13 11:20 PM      Result Value Ref Range Status   Specimen Description BLOOD LEFT ASSIST CONTROL   Final   Special Requests     Final   Value: BOTTLES DRAWN AEROBIC AND ANAEROBIC 5CC BOTH BOTTLES   Culture  Setup Time     Final   Value: 09/05/2013 16:55     Performed at [facility]   Culture     Final   Value:        BLOOD CULTURE RECEIVED NO GROWTH TO DATE CULTURE WILL BE HELD FOR 5 DAYS BEFORE ISSUING A FINAL NEGATIVE REPORT     Performed at [facility]   Report Status PENDING   Incomplete  URINE CULTURE     Status: None   Collection Time    09/05/13  2:00 AM      Result Value Ref Range Status   Specimen Description URINE, CLEAN CATCH   Final   Special Requests NONE   Final   Culture  Setup Time     Final   Value: 09/05/2013 15:12     Performed at [place]     Final   Value: 45,000 COLONIES/ML     Performed at [facility]   Culture     Final   Value: Multiple bacterial morphotypes present, none predominant. Suggest appropriate recollection if clinically indicated.     Performed at [facility]   Report Status 09/06/2013 FINAL   Final  CULTURE, BLOOD (ROUTINE X 2)     Status: None   Collection Time    09/05/13  3:27 AM      Result Value Ref Range Status   Specimen Description BLOOD LEFT ANTECUBITAL   Final   Special Requests BOTTLES DRAWN AEROBIC AND ANAEROBIC [facility] EACH   Final   Culture  Setup Time     Final   Value: 09/05/2013 12:07     Performed at [facility]   Culture     Final   Value: GROUP B STREP(S.AGALACTIAE)ISOLATED     Note: Gram Stain Report Called to,Read Back By and Verified With: PANDORA JACKSON 09/05/13 @ 9:34PM BY RUSCOE A.     Performed at [facility]   Report Status PENDING   Incomplete    Anti-infectives   Start     Dose/Rate Route Frequency Ordered  Stop   09/05/13 1000  vancomycin (VANCOCIN) IVPB 1000 mg/200 mL premix     1,000 mg 200 mL/hr over 60 Minutes Intravenous Every 12 hours 09/05/13 0559     09/05/13 0300  vancomycin (VANCOCIN) IVPB 1000 mg/200 mL premix     1,000 mg 200 mL/hr over 60 Minutes Intravenous  Once 09/05/13 0248 09/05/13 0448      Assessment: 58 yo M with cellulitis Vancomycin level= 11.6 Goal of Therapy:  Vancomycin trough level 10-15 mcg/ml  Plan:  Continue Vancomycin 1 Gm IV q 12 hours Follow up culture results  Gypsy Decant 09/06/2013,9:45 PM

## 2013-09-07 DIAGNOSIS — I82409 Acute embolism and thrombosis of unspecified deep veins of unspecified lower extremity: Secondary | ICD-10-CM | POA: Diagnosis not present

## 2013-09-07 DIAGNOSIS — I10 Essential (primary) hypertension: Secondary | ICD-10-CM | POA: Diagnosis not present

## 2013-09-07 DIAGNOSIS — L02419 Cutaneous abscess of limb, unspecified: Secondary | ICD-10-CM | POA: Diagnosis not present

## 2013-09-07 LAB — GLUCOSE, CAPILLARY
GLUCOSE-CAPILLARY: 146 mg/dL — AB (ref 70–99)
GLUCOSE-CAPILLARY: 181 mg/dL — AB (ref 70–99)
GLUCOSE-CAPILLARY: 98 mg/dL (ref 70–99)
Glucose-Capillary: 174 mg/dL — ABNORMAL HIGH (ref 70–99)
Glucose-Capillary: 135 mg/dL — ABNORMAL HIGH (ref 70–99)

## 2013-09-07 LAB — CULTURE, BLOOD (ROUTINE X 2)

## 2013-09-07 LAB — CBC
HCT: 40.3 % (ref 39.0–52.0)
HEMOGLOBIN: 13.2 g/dL (ref 13.0–17.0)
MCH: 28.6 pg (ref 26.0–34.0)
MCHC: 32.8 g/dL (ref 30.0–36.0)
MCV: 87.2 fL (ref 78.0–100.0)
Platelets: 239 10*3/uL (ref 150–400)
RBC: 4.62 MIL/uL (ref 4.22–5.81)
RDW: 12.6 % (ref 11.5–15.5)
WBC: 6.7 10*3/uL (ref 4.0–10.5)

## 2013-09-07 MED ORDER — HYDRALAZINE HCL 25 MG PO TABS
25.0000 mg | ORAL_TABLET | Freq: Four times a day (QID) | ORAL | Status: DC | PRN
  Administered 2013-09-07 – 2013-09-10 (×5): 25 mg via ORAL
  Filled 2013-09-07 (×3): qty 1

## 2013-09-07 MED ORDER — FUROSEMIDE 10 MG/ML IJ SOLN
40.0000 mg | Freq: Once | INTRAMUSCULAR | Status: AC
  Administered 2013-09-07: 40 mg via INTRAVENOUS
  Filled 2013-09-07 (×2): qty 4

## 2013-09-07 MED ORDER — CLONIDINE HCL 0.1 MG PO TABS
0.1000 mg | ORAL_TABLET | ORAL | Status: AC
  Administered 2013-09-07: 0.1 mg via ORAL
  Filled 2013-09-07: qty 1

## 2013-09-07 MED ORDER — CEFAZOLIN SODIUM-DEXTROSE 2-3 GM-% IV SOLR
2.0000 g | Freq: Three times a day (TID) | INTRAVENOUS | Status: DC
  Administered 2013-09-07 – 2013-09-10 (×9): 2 g via INTRAVENOUS
  Filled 2013-09-07 (×10): qty 50

## 2013-09-07 MED ORDER — CLONIDINE HCL 0.2 MG PO TABS
0.2000 mg | ORAL_TABLET | Freq: Two times a day (BID) | ORAL | Status: DC
  Administered 2013-09-07 – 2013-09-08 (×2): 0.2 mg via ORAL
  Filled 2013-09-07 (×3): qty 1

## 2013-09-07 NOTE — Consult Note (Signed)
WOC wound consult note Reason for Consult:left pretibial wound and right great toe fissure Wound type:trauma (albeit patient cannot recall injury) Pressure Ulcer POA: No Measurement:left pretibial ulceration: 1.5cm x 0.5cm with no appreciable depth due to the presence of dried serum.  No fluctuance. Right great toe fissure: 0.2cm x 2cm x 0.2cm Wound bed:at Right great toe fissure: red, dry.  At left pretibial wound: wound bed is covered with dried serum (scab). Drainage (amount, consistency, odor) None. Periwound:At right great toe fissure:  Dry, peeling tissue. At left pretibial ulceration, intact, no evidence of erythema of induration indicative of infection. Dressing procedure/placement/frequency: I will provide conservative dressing orders to moisten and debride the dried tissue surrounding the right great toe fissure and the dried serum covering the left pretibial ulcer.  These will be performed twice daily while here in acute care. [place] nursing team will not follow, but will remain available to this patient, the nursing and medical team.  Please re-consult if needed. Thanks, Maudie Flakes, MSN, RN, [place], [place], [place] [phone])

## 2013-09-07 NOTE — Progress Notes (Signed)
Patient ID: [name], male   DOB: 03-29-1955, 58 y.o.   MRN: [id number] [facility] PROGRESS NOTE  [name] EYC:[id number] DOB: 1955-05-24 DOA: 09/04/2013 PCP: [name], MD  Brief narrative: 58 y.o. male with past medical history of HTN, IDDM, CHF who presents to [facility] ED 09/04/2013 with fevers, chills and left leg ulcer on anterior shin draining pus. X ray on admission did not show osteomyelitis. He was started on vancomycin. His blood culture is growing group B strep.  Assessment/Plan:   Principal Problem: Group B strep bacteremia / Cellulitis of RLE / Left pretibial ulcer / right great toe fissure / Leukocytosis  Per wound care assessment: left pretibial ulceration: 1.5cm x 0.5cm with no appreciable depth due to the presence of dried serum. Right great toe fissure: 0.2cm x 2cm x 0.2cm. Right great toe fissure: red, dry. At left pretibial wound: wound bed is covered with dried serum (scab). At right great toe fissure there is dry, peeling tissue. Left pretibial ulceration periwound is intact, there is no evidence of erythema of induration indicative of infection. Dressing: conservative dressing orders to moisten and debride the dried tissue surrounding the right great toe fissure and the dried serum covering the left pretibial ulcer. These can be performed twice daily while in hospital.  Blood culture on admission is growing group B strep. Continue vanco for now; will check with ID about change in PO abx and duration of treatmetn  Obtain blood cultures again today to ensure clearing of bacteremia   Active Problems: Hypertension   Continue metoprolol, Hctz, clonidine   Added PRN hydralazine for better BP control  Diabetes, controlled with complication of skin ulcer  A1c on this admission 6.6 indicating good glycemic control   Continue current insulin regimen  Continue metformin  H/O CAD and stent placement   On anticoagulation with xarelto   Continue  statin therapy, lasix, ARB's  Right lower extremity swelling  RLE doppler negative for DVT  Give extra dose of lasix 40 mg IV once and see if any improvement   DVT prophylaxis: on therapeutic anticoagulation with xarelto   Code Status: full code  Family Communication: plan of care discussed with the patient  Disposition Plan: home when stable, likely in next 24 hours   Consultants:  Wound care ID - Dr. [name] - phone call only Procedures:  None  Antibiotics:  Vancomycin 09/04/2013 -->  [name], MD  [facility] Pager [phone]  If 7PM-7AM, please contact night-coverage [email or web address] Password [email or web address] 09/07/2013, 2:50 PM   LOS: 3 days   HPI/Subjective: No acute overnight events.  Objective: Filed Vitals:   09/06/13 2202 09/07/13 0223 09/07/13 0504 09/07/13 1427  BP: 195/73 188/76 170/63 170/68  Pulse: 64 64 66 64  Temp: 97.9 F (36.6 C)  97.5 F (36.4 C) 97.6 F (36.4 C)  TempSrc: Oral  Oral Oral  Resp: 18  18 16   Height:      Weight:      SpO2: 98%  96% 97%    Intake/Output Summary (Last 24 hours) at 09/07/13 1450 Last data filed at 09/07/13 1300  Gross per 24 hour  Intake    720 ml  Output   3100 ml  Net  -2380 ml    Exam:   General:  Pt is alert, follows commands appropriately, not in acute distress  Cardiovascular: Regular rate and rhythm, S1/S2 appreciated   Respiratory: Clear to auscultation bilaterally, no wheezing, no crackles, no rhonchi  Abdomen: Soft, non tender,  non distended, bowel sounds present  Extremities: RLE swelling better, left anterior shin ulcer looks better; right great toe fissure, pulses DP and PT palpable bilaterally  Neuro: Grossly nonfocal  Data Reviewed: Basic Metabolic Panel:  Recent Labs Lab 09/04/13 2320  NA 140  K 3.7  CL 98  CO2 28  GLUCOSE 159*  BUN 10  CREATININE 0.95  CALCIUM 9.5   Liver Function Tests:  Recent Labs Lab 09/04/13 2320  AST 20  ALT 14  ALKPHOS 79  BILITOT 0.5  PROT  8.5*  ALBUMIN 3.9    Recent Labs Lab 09/04/13 2320  LIPASE 24   No results found for this basename: AMMONIA,  in the last 168 hours CBC:  Recent Labs Lab 09/04/13 2320  WBC 15.5*  NEUTROABS 13.3*  HGB 14.5  HCT 43.9  MCV 86.6  PLT 252   Cardiac Enzymes: No results found for this basename: CKTOTAL, CKMB, CKMBINDEX, TROPONINI,  in the last 168 hours BNP: No components found with this basename: POCBNP,  CBG:  Recent Labs Lab 09/06/13 1802 09/06/13 1909 09/06/13 2201 09/07/13 0813 09/07/13 1146  GLUCAP 68* 126* 146* 181* 174*    CULTURE, BLOOD (ROUTINE X 2)     Status: None   Collection Time    09/04/13 11:20 PM      Result Value Ref Range Status   Specimen Description BLOOD LEFT ASSIST CONTROL   Final   Value:        BLOOD CULTURE RECEIVED NO GROWTH TO DATE      Performed at [facility]   Report Status PENDING   Incomplete  URINE CULTURE     Status: None   Collection Time    09/05/13  2:00 AM      Result Value Ref Range Status   Specimen Description URINE, CLEAN CATCH   Final   Value: 45,000 COLONIES/ML     Performed at [facility]     Final     Performed at [facility]   Report Status 09/06/2013 FINAL   Final  CULTURE, BLOOD (ROUTINE X 2)     Status: None   Collection Time    09/05/13  3:27 AM      Result Value Ref Range Status   Specimen Description BLOOD LEFT ANTECUBITAL   Final   Value: GROUP B STREP(S.AGALACTIAE)ISOLATED     Performed at [facility]   Report Status 09/07/2013 FINAL   Final   Organism ID, Bacteria GROUP B STREP(S.AGALACTIAE)ISOLATED   Final     Studies: No results found.  Scheduled Meds: . atorvastatin  40 mg Oral q1800  . cloNIDine  0.1 mg Oral BID  . furosemide  40 mg Intravenous Once  . furosemide  80 mg Oral BID  . hydrochlorothiazide  12.5 mg Oral Daily  . insulin aspart  0-9 Units Subcutaneous TID WC  . insulin aspart protamine- aspart  32 Units Subcutaneous Q supper  .  insulin aspart protamine- aspart  52 Units Subcutaneous QAC breakfast  . losartan  100 mg Oral Daily  . metFORMIN  1,000 mg Oral BID WC  . rivaroxaban  20 mg Oral Daily  . vancomycin  1,000 mg Intravenous Q12H   Continuous Infusions:

## 2013-09-07 NOTE — Progress Notes (Signed)
ANTIBIOTIC CONSULT NOTE - INITIAL  Pharmacy Consult for Cefazolin Indication: Bacterimia  Allergies  Allergen Reactions  . Iohexol     hives  . Ivp Dye [Iodinated Diagnostic Agents]     hives    Patient Measurements: Height: 5\' 9"  (175.3 cm) Weight: 265 lb (120.203 kg) IBW/kg (Calculated) : 70.7  Vital Signs: Temp: 97.6 F (36.4 C) (07/13 1427) Temp src: Oral (07/13 1427) BP: 170/68 mmHg (07/13 1427) Pulse Rate: 64 (07/13 1427) Intake/Output from previous day: 07/12 0701 - 07/13 0700 In: 720 [P.O.:720] Out: 3100 [Urine:3100] Intake/Output from this shift: Total I/O In: 480 [P.O.:480] Out: 1000 [Urine:1000]  Labs:  Recent Labs  09/04/13 2320  WBC 15.5*  HGB 14.5  PLT 252  CREATININE 0.95   Estimated Creatinine Clearance: 109.8 ml/min (by C-G formula based on Cr of 0.95).  Recent Labs  09/06/13 2104  [place] 11.6     Microbiology: Recent Results (from the past 720 hour(s))  CULTURE, BLOOD (ROUTINE X 2)     Status: None   Collection Time    09/04/13 11:20 PM      Result Value Ref Range Status   Specimen Description BLOOD LEFT ASSIST CONTROL   Final   Special Requests     Final   Value: BOTTLES DRAWN AEROBIC AND ANAEROBIC 5CC BOTH BOTTLES   Culture  Setup Time     Final   Value: 09/05/2013 16:55     Performed at [facility]   Culture     Final   Value:        BLOOD CULTURE RECEIVED NO GROWTH TO DATE CULTURE WILL BE HELD FOR 5 DAYS BEFORE ISSUING A FINAL NEGATIVE REPORT     Performed at [facility]   Report Status PENDING   Incomplete  URINE CULTURE     Status: None   Collection Time    09/05/13  2:00 AM      Result Value Ref Range Status   Specimen Description URINE, CLEAN CATCH   Final   Special Requests NONE   Final   Culture  Setup Time     Final   Value: 09/05/2013 15:12     Performed at [place]     Final   Value: 45,000 COLONIES/ML     Performed at [facility]   Culture     Final    Value: Multiple bacterial morphotypes present, none predominant. Suggest appropriate recollection if clinically indicated.     Performed at [facility]   Report Status 09/06/2013 FINAL   Final  CULTURE, BLOOD (ROUTINE X 2)     Status: None   Collection Time    09/05/13  3:27 AM      Result Value Ref Range Status   Specimen Description BLOOD LEFT ANTECUBITAL   Final   Special Requests BOTTLES DRAWN AEROBIC AND ANAEROBIC [facility] EACH   Final   Culture  Setup Time     Final   Value: 09/05/2013 12:07     Performed at [facility]   Culture     Final   Value: GROUP B STREP(S.AGALACTIAE)ISOLATED     Note: Gram Stain Report Called to,Read Back By and Verified With: PANDORA Brnadon Eoff 09/05/13 @ 9:34PM BY RUSCOE A.     Performed at [facility]   Report Status 09/07/2013 FINAL   Final   Organism ID, Bacteria GROUP B STREP(S.AGALACTIAE)ISOLATED   Final    Medical History: Past Medical  History  Diagnosis Date  . Blind left eye   . Hypertension   . Diabetes mellitus without complication   . Gout   . Herpes   . CHF (congestive heart failure)   . Cardiomyopathy   . Hypercholesterolemia   . MI (myocardial infarction)   . Pneumonia   . Adrenal insufficiency   . Peripheral neuropathy   . Carpal tunnel syndrome   . PVD (peripheral vascular disease)   . ED (erectile dysfunction)   . Allergy   . Cataract     bil removed  . Arthritis   . Gout     Medications:  Scheduled:  . atorvastatin  40 mg Oral q1800  .  ceFAZolin (ANCEF) IV  2 g Intravenous 3 times per day  . cloNIDine  0.1 mg Oral BID  . furosemide  80 mg Oral BID  . hydrochlorothiazide  12.5 mg Oral Daily  . insulin aspart  0-9 Units Subcutaneous TID WC  . insulin aspart protamine- aspart  32 Units Subcutaneous Q supper  . insulin aspart protamine- aspart  52 Units Subcutaneous QAC breakfast  . losartan  100 mg Oral Daily  . metFORMIN  1,000 mg Oral BID WC  . rivaroxaban  20 mg Oral Daily     Assessment: 58 y.o. male with past medical history of HTN, IDDM, CHF who presents to [facility] ED 09/04/2013 with fevers, chills and left leg ulcer on anterior shin draining pus. X ray on admission did not show osteomyelitis. He was started on vancomycin, discontinued on 7/13 with pharmacy to dose cefazolin for bacterimia.  His blood culture is growing pan-sens group B strep.  Goal of Therapy:  Eradication of infection  Plan:   Cefazolin 2Gm IV q8h  Follow renal function   Dolly Rias RPh 09/07/2013, 4:35 PM Pager [phone]

## 2013-09-08 DIAGNOSIS — I10 Essential (primary) hypertension: Secondary | ICD-10-CM | POA: Diagnosis not present

## 2013-09-08 DIAGNOSIS — L02419 Cutaneous abscess of limb, unspecified: Secondary | ICD-10-CM | POA: Diagnosis not present

## 2013-09-08 DIAGNOSIS — I82409 Acute embolism and thrombosis of unspecified deep veins of unspecified lower extremity: Secondary | ICD-10-CM | POA: Diagnosis not present

## 2013-09-08 LAB — GLUCOSE, CAPILLARY
GLUCOSE-CAPILLARY: 132 mg/dL — AB (ref 70–99)
Glucose-Capillary: 145 mg/dL — ABNORMAL HIGH (ref 70–99)
Glucose-Capillary: 64 mg/dL — ABNORMAL LOW (ref 70–99)
Glucose-Capillary: 104 mg/dL — ABNORMAL HIGH (ref 70–99)
Glucose-Capillary: 118 mg/dL — ABNORMAL HIGH (ref 70–99)

## 2013-09-08 MED ORDER — CLONIDINE HCL 0.2 MG PO TABS
0.2000 mg | ORAL_TABLET | Freq: Three times a day (TID) | ORAL | Status: DC
  Administered 2013-09-08 – 2013-09-10 (×6): 0.2 mg via ORAL
  Filled 2013-09-08 (×8): qty 1

## 2013-09-08 NOTE — Progress Notes (Signed)
Pt CBG 64. 4oz juice given and meal also delivered to pt. CBG 104 when rechecked. Pt is in no distress. Will continue to monitor.

## 2013-09-08 NOTE — Progress Notes (Signed)
Patient ID: [name], male   DOB: May 01, 1955, 58 y.o.   MRN: [id number] [facility] PROGRESS NOTE  [name] MOQ:[id number] DOB: [birth year] DOA: 09/04/2013 PCP: [name], MD  Brief narrative: 58 y.o. male with past medical history of HTN, IDDM, CHF who presents to [facility] ED 09/04/2013 with fevers, chills and left leg ulcer on anterior shin draining pus. X ray on admission did not show osteomyelitis. He was started on vancomycin. His blood culture is growing group B strep.   Assessment/Plan:   Principal Problem:  Group B strep bacteremia / Cellulitis of RLE / Left pretibial ulcer / right great toe fissure / Leukocytosis  Per wound care assessment: left pretibial ulceration: 1.5cm x 0.5cm with no appreciable depth due to the presence of dried serum. Right great toe fissure: 0.2cm x 2cm x 0.2cm. Right great toe fissure: red, dry. At left pretibial wound: wound bed is covered with dried serum (scab). At right great toe fissure there is dry, peeling tissue. Left pretibial ulceration periwound is intact, there is no evidence of erythema of induration indicative of infection. Dressing: conservative dressing orders to moisten and debride the dried tissue surrounding the right great toe fissure and the dried serum covering the left pretibial ulcer. These can be performed twice daily while in hospital.  Blood culture on admission growing group B strep. I spoke with Dr. [name] who recommended changing the antibiotics to Ancef while patient is in hospital and on discharge patient may take amoxicillin or Keflex, duration of treatment to be total of 2 weeks of antibiotics including the patient has received in the hospital. Followup results of blood cultures obtained 09/07/2013 to ensure clearance of bacteremia.  Active Problems:  Hypertension  Blood pressure still relatively uncontrolled, 161/73. When necessary hydralazine did not provide better blood pressure control. Clonidine and was  0.2 mg twice a day so we will change it to every 8 hours to see if this keeps blood pressure under better control. We will continue patient's other home blood pressure medications including Microzide,  losartan, Lasix Diabetes, controlled with complication of skin ulcer  A1c on this admission 6.6 indicating good glycemic control  Continue current insulin regimen, NovoLog 70/30 mix 32 units at bedtime and 52 units in the morning Continue metformin  H/O CAD and stent placement  On anticoagulation with xarelto  Continue statin therapy, lasix, ARB's  Right lower extremity swelling  RLE doppler negative for DVT  Given extra dose of lasix 40 mg IV once 09/07/2013 and swelling is somewhat better.   DVT prophylaxis: on therapeutic anticoagulation with xarelto    Code Status: full code  Family Communication: plan of care discussed with the patient  Disposition Plan: home when stable, likely in next 24 hours    Consultants:  Wound care  ID - Dr. [name] - phone call only Procedures:  None  Antibiotics:  Vancomycin 09/04/2013 --> 09/07/2013 Cefazolin 713/2015 -->   James Lenz, MD  [facility] Pager [phone]  If 7PM-7AM, please contact night-coverage [email or web address] Password [facility] 09/08/2013, 11:53 AM   LOS: 4 days   HPI/Subjective: No acute overnight events.  Objective: Filed Vitals:   09/07/13 1900 09/07/13 2200 09/08/13 0504 09/08/13 0954  BP: 178/82 157/74 184/74 161/73  Pulse: 53 64 59 67  Temp:  97.7 F (36.5 C) 97.5 F (36.4 C)   TempSrc:  Oral Oral   Resp: 16 16 18    Height:      Weight:      SpO2: 97%  97% 97%     Intake/Output Summary (Last 24 hours) at 09/08/13 1153 Last data filed at 09/08/13 0825  Gross per 24 hour  Intake    720 ml  Output   1550 ml  Net   -830 ml    Exam:  General: Pt is awake, alert, and not in acute distress  Cardiovascular: Regular rate and rhythm, S1/S2 appreciated  Respiratory: Bilateral air entry, no wheezing Abdomen:  Soft, non tender, non distended, bowel sounds present  Extremities: RLE swelling better, left anterior shin ulcer looks better; right great toe fissure, pulses DP and PT palpable bilaterally  Neuro: Focal neurological deficits   Data Reviewed: Basic Metabolic Panel:  Recent Labs Lab 09/04/13 2320  NA 140  K 3.7  CL 98  CO2 28  GLUCOSE 159*  BUN 10  CREATININE 0.95  CALCIUM 9.5   Liver Function Tests:  Recent Labs Lab 09/04/13 2320  AST 20  ALT 14  ALKPHOS 79  BILITOT 0.5  PROT 8.5*  ALBUMIN 3.9    Recent Labs Lab 09/04/13 2320  LIPASE 24   No results found for this basename: AMMONIA,  in the last 168 hours CBC:  Recent Labs Lab 09/04/13 2320 09/07/13 1610  WBC 15.5* 6.7  NEUTROABS 13.3*  --   HGB 14.5 13.2  HCT 43.9 40.3  MCV 86.6 87.2  PLT 252 239   Cardiac Enzymes: No results found for this basename: CKTOTAL, CKMB, CKMBINDEX, TROPONINI,  in the last 168 hours BNP: No components found with this basename: POCBNP,  CBG:  Recent Labs Lab 09/07/13 1146 09/07/13 1718 09/07/13 2213 09/08/13 0722 09/08/13 1127  GLUCAP 174* 135* 98 145* 132*    Recent Results (from the past 240 hour(s))  CULTURE, BLOOD (ROUTINE X 2)     Status: None   Collection Time    09/04/13 11:20 PM      Result Value Ref Range Status   Specimen Description BLOOD LEFT ASSIST CONTROL   Final   Special Requests     Final   Value: BOTTLES DRAWN AEROBIC AND ANAEROBIC 5CC BOTH BOTTLES   Culture  Setup Time     Final   Value: 09/05/2013 16:55     Performed at [facility]   Culture     Final   Value:        BLOOD CULTURE RECEIVED NO GROWTH TO DATE CULTURE WILL BE HELD FOR 5 DAYS BEFORE ISSUING A FINAL NEGATIVE REPORT     Performed at [facility]   Report Status PENDING   Incomplete  URINE CULTURE     Status: None   Collection Time    09/05/13  2:00 AM      Result Value Ref Range Status   Specimen Description URINE, CLEAN CATCH   Final   Special Requests  NONE   Final   Culture  Setup Time     Final   Value: 09/05/2013 15:12     Performed at [place]     Final   Value: 45,000 COLONIES/ML     Performed at [facility]   Culture     Final   Value: Multiple bacterial morphotypes present, none predominant. Suggest appropriate recollection if clinically indicated.     Performed at [facility]   Report Status 09/06/2013 FINAL   Final  CULTURE, BLOOD (ROUTINE X 2)     Status: None   Collection Time    09/05/13  3:27 AM      Result Value Ref Range Status   Specimen Description BLOOD LEFT ANTECUBITAL   Final   Special Requests BOTTLES DRAWN AEROBIC AND ANAEROBIC [facility] EACH   Final   Culture  Setup Time     Final   Value: 09/05/2013 12:07     Performed at [facility]   Culture     Final   Value: GROUP B STREP(S.AGALACTIAE)ISOLATED     Note: Gram Stain Report Called to,Read Back By and Verified With: PANDORA JACKSON 09/05/13 @ 9:34PM BY RUSCOE A.     Performed at [facility]   Report Status 09/07/2013 FINAL   Final   Organism ID, Bacteria GROUP B STREP(S.AGALACTIAE)ISOLATED   Final  CULTURE, BLOOD (ROUTINE X 2)     Status: None   Collection Time    09/07/13  4:00 PM      Result Value Ref Range Status   Specimen Description BLOOD RIGHT HAND   Final   Special Requests BOTTLES DRAWN AEROBIC AND ANAEROBIC 10CC   Final   Culture  Setup Time     Final   Value: 09/07/2013 20:06     Performed at [facility]   Culture     Final   Value:        BLOOD CULTURE RECEIVED NO GROWTH TO DATE CULTURE WILL BE HELD FOR 5 DAYS BEFORE ISSUING A FINAL NEGATIVE REPORT     Performed at [facility]   Report Status PENDING   Incomplete  CULTURE, BLOOD (ROUTINE X 2)     Status: None   Collection Time    09/07/13  4:10 PM      Result Value Ref Range Status   Specimen Description BLOOD RIGHT HAND   Final   Special Requests BOTTLES DRAWN AEROBIC AND ANAEROBIC 10CC   Final   Culture  Setup  Time     Final   Value: 09/07/2013 20:06     Performed at [facility]   Culture     Final   Value:        BLOOD CULTURE RECEIVED NO GROWTH TO DATE CULTURE WILL BE HELD FOR 5 DAYS BEFORE ISSUING A FINAL NEGATIVE REPORT     Performed at [facility]   Report Status PENDING   Incomplete     Studies: No results found.  Scheduled Meds: . atorvastatin  40 mg Oral q1800  .  ceFAZolin (ANCEF) IV  2 g Intravenous 3 times per day  . cloNIDine  0.2 mg Oral TID  . furosemide  80 mg Oral BID  . hydrochlorothiazide  12.5 mg Oral Daily  . insulin aspart  0-9 Units Subcutaneous TID WC  . insulin aspart protamine- aspart  32 Units Subcutaneous Q supper  . insulin aspart protamine- aspart  52 Units Subcutaneous QAC breakfast  . losartan  100 mg Oral Daily  . metFORMIN  1,000 mg Oral BID WC  . rivaroxaban  20 mg Oral Daily   Continuous Infusions:

## 2013-09-09 DIAGNOSIS — A419 Sepsis, unspecified organism: Secondary | ICD-10-CM

## 2013-09-09 DIAGNOSIS — A409 Streptococcal sepsis, unspecified: Secondary | ICD-10-CM | POA: Diagnosis not present

## 2013-09-09 DIAGNOSIS — L02419 Cutaneous abscess of limb, unspecified: Secondary | ICD-10-CM | POA: Diagnosis not present

## 2013-09-09 LAB — GLUCOSE, CAPILLARY
GLUCOSE-CAPILLARY: 143 mg/dL — AB (ref 70–99)
GLUCOSE-CAPILLARY: 112 mg/dL — AB (ref 70–99)
Glucose-Capillary: 173 mg/dL — ABNORMAL HIGH (ref 70–99)

## 2013-09-09 NOTE — Progress Notes (Signed)
Patient ID: [name], male   DOB: Jun 12, 1955, 58 y.o.   MRN: [id number] [facility] PROGRESS NOTE  [name]:[id number] DOB: 08/06/1955 DOA: 09/04/2013 PCP: James Austria, MD  Brief narrative: 58 y.o. male with past medical history of HTN, IDDM, CHF who presents to [facility] ED 09/04/2013 with fevers, chills and left leg ulcer on anterior shin draining pus. X ray on admission did not show osteomyelitis. He was started on vancomycin. His blood culture is growing group B strep.   Assessment/Plan:   Principal Problem:  Group B strep bacteremia / Cellulitis of RLE / Left pretibial ulcer / right great toe fissure / Leukocytosis  Per wound care assessment: left pretibial ulceration: 1.5cm x 0.5cm with no appreciable depth due to the presence of dried serum. Right great toe fissure: 0.2cm x 2cm x 0.2cm. Right great toe fissure: red, dry. At left pretibial wound: wound bed is covered with dried serum (scab). At right great toe fissure there is dry, peeling tissue. Left pretibial ulceration periwound is intact, there is no evidence of erythema of induration indicative of infection. Dressing: conservative dressing orders to moisten and debride the dried tissue surrounding the right great toe fissure and the dried serum covering the left pretibial ulcer. These can be performed twice daily while in hospital.  Blood culture on admission growing group B strep. I spoke with Dr. [name] who recommended changing the antibiotics to Ancef while patient is in hospital and on discharge patient may take amoxicillin or Keflex, duration of treatment to be total of 2 weeks of antibiotics including the patient has received in the hospital. Followup results of blood cultures obtained 09/07/2013 to ensure clearance of bacteremia.  Active Problems:  Hypertension  Blood pressure still relatively uncontrolled, 161/73. When necessary hydralazine did not provide better blood pressure control. Clonidine was 0.2 mg  twice a day, changed to every 8 hours given elevated blood presures. Continue patient's other home blood pressure medications including Microzide,  losartan, Lasix Diabetes, controlled with complication of skin ulcer  A1c on this admission 6.6 indicating good glycemic control  Continue current insulin regimen, NovoLog 70/30 mix 32 units at bedtime and 52 units in the morning Continue metformin  H/O CAD and stent placement  On anticoagulation with xarelto  Continue statin therapy, lasix, ARB's  Right lower extremity swelling  RLE doppler negative for DVT  Given extra dose of lasix 40 mg IV once 09/07/2013 and swelling is somewhat better.   DVT prophylaxis: on therapeutic anticoagulation with xarelto    Code Status: full code  Family Communication: plan of care discussed with the patient  Disposition Plan: home when stable, likely in next 24 hours    Consultants:  Wound care  ID - Dr. [name] - phone call only Procedures:  None  Antibiotics:  Vancomycin 09/04/2013 --> 09/07/2013 Cefazolin 713/2015 -->   [name], MD  [facility] Pager [phone]  If 7PM-7AM, please contact night-coverage [email or web address] Password [email or web address] 09/09/2013, 2:45 PM   LOS: 5 days   HPI/Subjective: No acute overnight events.  Objective: Filed Vitals:   09/08/13 1411 09/08/13 2240 09/09/13 0533 09/09/13 1321  BP: 128/63 174/70 162/54 142/66  Pulse: 62 61 58 68  Temp: 97.4 F (36.3 C) 97.8 F (36.6 C) 97.6 F (36.4 C) 97.8 F (36.6 C)  TempSrc: Oral Oral Oral Oral  Resp: 18 18 18 18   Height:      Weight:      SpO2: 98% 98% 98% 98%    Intake/Output  Summary (Last 24 hours) at 09/09/13 1445 Last data filed at 09/09/13 1002  Gross per 24 hour  Intake    240 ml  Output   2400 ml  Net  -2160 ml    Exam:  General: Pt is awake, alert, and not in acute distress  Cardiovascular: Regular rate and rhythm, S1/S2 appreciated  Respiratory: Bilateral air entry, no wheezing Abdomen: Soft,  non tender, non distended, bowel sounds present  Extremities: RLE swelling better, left anterior shin ulcer looks better; right great toe fissure, pulses DP and PT palpable bilaterally  Neuro: Focal neurological deficits   Data Reviewed: Basic Metabolic Panel:  Recent Labs Lab 09/04/13 2320  NA 140  K 3.7  CL 98  CO2 28  GLUCOSE 159*  BUN 10  CREATININE 0.95  CALCIUM 9.5   Liver Function Tests:  Recent Labs Lab 09/04/13 2320  AST 20  ALT 14  ALKPHOS 79  BILITOT 0.5  PROT 8.5*  ALBUMIN 3.9    Recent Labs Lab 09/04/13 2320  LIPASE 24   No results found for this basename: AMMONIA,  in the last 168 hours CBC:  Recent Labs Lab 09/04/13 2320 09/07/13 1610  WBC 15.5* 6.7  NEUTROABS 13.3*  --   HGB 14.5 13.2  HCT 43.9 40.3  MCV 86.6 87.2  PLT 252 239   Cardiac Enzymes: No results found for this basename: CKTOTAL, CKMB, CKMBINDEX, TROPONINI,  in the last 168 hours BNP: No components found with this basename: POCBNP,  CBG:  Recent Labs Lab 09/08/13 1643 09/08/13 1745 09/08/13 2239 09/09/13 0712 09/09/13 1123  GLUCAP 64* 104* 118* 173* 143*    Recent Results (from the past 240 hour(s))  CULTURE, BLOOD (ROUTINE X 2)     Status: None   Collection Time    09/04/13 11:20 PM      Result Value Ref Range Status   Specimen Description BLOOD LEFT ASSIST CONTROL   Final   Special Requests     Final   Value: BOTTLES DRAWN AEROBIC AND ANAEROBIC 5CC BOTH BOTTLES   Culture  Setup Time     Final   Value: 09/05/2013 16:55     Performed at [facility]   Culture     Final   Value:        BLOOD CULTURE RECEIVED NO GROWTH TO DATE CULTURE WILL BE HELD FOR 5 DAYS BEFORE ISSUING A FINAL NEGATIVE REPORT     Performed at [facility]   Report Status PENDING   Incomplete  URINE CULTURE     Status: None   Collection Time    09/05/13  2:00 AM      Result Value Ref Range Status   Specimen Description URINE, CLEAN CATCH   Final   Special Requests NONE    Final   Culture  Setup Time     Final   Value: 09/05/2013 15:12     Performed at [place]     Final   Value: 45,000 COLONIES/ML     Performed at [facility]   Culture     Final   Value: Multiple bacterial morphotypes present, none predominant. Suggest appropriate recollection if clinically indicated.     Performed at [facility]   Report Status 09/06/2013 FINAL   Final  CULTURE, BLOOD (ROUTINE X 2)     Status: None   Collection Time    09/05/13  3:27 AM      Result  Value Ref Range Status   Specimen Description BLOOD LEFT ANTECUBITAL   Final   Special Requests BOTTLES DRAWN AEROBIC AND ANAEROBIC [facility]   Final   Culture  Setup Time     Final   Value: 09/05/2013 12:07     Performed at [facility]   Culture     Final   Value: GROUP B STREP(S.AGALACTIAE)ISOLATED     Note: Gram Stain Report Called to,Read Back By and Verified With: PANDORA JACKSON 09/05/13 @ 9:34PM BY RUSCOE A.     Performed at [facility]   Report Status 09/07/2013 FINAL   Final   Organism ID, Bacteria GROUP B STREP(S.AGALACTIAE)ISOLATED   Final  CULTURE, BLOOD (ROUTINE X 2)     Status: None   Collection Time    09/07/13  4:00 PM      Result Value Ref Range Status   Specimen Description BLOOD RIGHT HAND   Final   Special Requests BOTTLES DRAWN AEROBIC AND ANAEROBIC 10CC   Final   Culture  Setup Time     Final   Value: 09/07/2013 20:06     Performed at [facility]   Culture     Final   Value:        BLOOD CULTURE RECEIVED NO GROWTH TO DATE CULTURE WILL BE HELD FOR 5 DAYS BEFORE ISSUING A FINAL NEGATIVE REPORT     Performed at [facility]   Report Status PENDING   Incomplete  CULTURE, BLOOD (ROUTINE X 2)     Status: None   Collection Time    09/07/13  4:10 PM      Result Value Ref Range Status   Specimen Description BLOOD RIGHT HAND   Final   Special Requests BOTTLES DRAWN AEROBIC AND ANAEROBIC 10CC   Final   Culture  Setup Time      Final   Value: 09/07/2013 20:06     Performed at [facility]   Culture     Final   Value:        BLOOD CULTURE RECEIVED NO GROWTH TO DATE CULTURE WILL BE HELD FOR 5 DAYS BEFORE ISSUING A FINAL NEGATIVE REPORT     Performed at [facility]   Report Status PENDING   Incomplete     Studies: No results found.  Scheduled Meds: . atorvastatin  40 mg Oral q1800  .  ceFAZolin (ANCEF) IV  2 g Intravenous 3 times per day  . cloNIDine  0.2 mg Oral TID  . furosemide  80 mg Oral BID  . hydrochlorothiazide  12.5 mg Oral Daily  . insulin aspart  0-9 Units Subcutaneous TID WC  . insulin aspart protamine- aspart  32 Units Subcutaneous Q supper  . insulin aspart protamine- aspart  52 Units Subcutaneous QAC breakfast  . losartan  100 mg Oral Daily  . metFORMIN  1,000 mg Oral BID WC  . rivaroxaban  20 mg Oral Daily   Continuous Infusions:

## 2013-09-10 DIAGNOSIS — A419 Sepsis, unspecified organism: Secondary | ICD-10-CM | POA: Diagnosis present

## 2013-09-10 DIAGNOSIS — A409 Streptococcal sepsis, unspecified: Secondary | ICD-10-CM | POA: Diagnosis not present

## 2013-09-10 DIAGNOSIS — L02419 Cutaneous abscess of limb, unspecified: Secondary | ICD-10-CM | POA: Diagnosis not present

## 2013-09-10 DIAGNOSIS — I10 Essential (primary) hypertension: Secondary | ICD-10-CM | POA: Diagnosis not present

## 2013-09-10 LAB — GLUCOSE, CAPILLARY
GLUCOSE-CAPILLARY: 122 mg/dL — AB (ref 70–99)
Glucose-Capillary: 123 mg/dL — ABNORMAL HIGH (ref 70–99)
Glucose-Capillary: 96 mg/dL (ref 70–99)

## 2013-09-10 MED ORDER — CEPHALEXIN 500 MG PO CAPS: 500.0000 mg | ORAL_CAPSULE | Freq: Three times a day (TID) | ORAL | Status: DC

## 2013-09-10 MED ORDER — HYDRALAZINE HCL 25 MG PO TABS: 25.0000 mg | ORAL_TABLET | Freq: Three times a day (TID) | ORAL | Status: DC

## 2013-09-10 MED ORDER — CLONIDINE HCL 0.2 MG PO TABS: 0.2000 mg | ORAL_TABLET | Freq: Three times a day (TID) | ORAL | Status: DC

## 2013-09-10 NOTE — Discharge Summary (Signed)
Physician Discharge Summary  [name] KXF:[id number] DOB: 05-24-1955 DOA: 09/04/2013  PCP: James Austria, MD  Admit date: 09/04/2013 Discharge date: 09/10/2013  Time spent: 35 minutes  Recommendations for Outpatient Follow-up:  1. Please follow up on patient's blood pressures   Discharge Diagnoses:  Principal Problem:   Cellulitis and abscess of leg Active Problems:   Sepsis   Diabetes type 2, uncontrolled   Hypertension   CHF (congestive heart failure)   DVT (deep venous thrombosis)   Discharge Condition: Stable  Diet recommendation: Carb modified heart healthy diet  Filed Weights   09/04/13 2259  Weight: 120.203 kg (265 lb)    History of present illness:  [name] is a 58 y.o. male with h/o HTN, IDDM, CHF, who presents to ED with c/o fever and chills. Symptoms onset at 11 am this morning and have been gradually but persistently worsening over the past 12 hours. No cough, chest pain, back pain, diarrhea, dysuria, sore throat, headache. He has had some swelling of BLE R>L and drainage from a wound in his mid anterior left lower extremity on his shin. At this point daughter chimes in and states that it was "draining pus earlier".  Hospital Course:  58 y.o. male with past medical history of HTN, IDDM, CHF who presents to [facility] ED 09/04/2013 with fevers, chills and left leg ulcer on anterior shin draining pus. X ray on admission did not show osteomyelitis. He was started on vancomycin. His blood culture is growing group B strep.   Group B strep bacteremia / Cellulitis of RLE / Left pretibial ulcer / right great toe fissure / Leukocytosis  Per wound care assessment: left pretibial ulceration: 1.5cm x 0.5cm with no appreciable depth due to the presence of dried serum. Right great toe fissure: 0.2cm x 2cm x 0.2cm. Right great toe fissure: red, dry. At left pretibial wound: wound bed is covered with dried serum (scab). At right great toe fissure there is dry, peeling  tissue. Left pretibial ulceration periwound is intact, there is no evidence of erythema of induration indicative of infection. Dressing: conservative dressing orders to moisten and debride the dried tissue surrounding the right great toe fissure and the dried serum covering the left pretibial ulcer. These can be performed twice daily while in hospital.  Blood culture on admission growing group B strep. I spoke with Dr. [name] who recommended changing the antibiotics to Ancef while patient is in hospital and on discharge patient may take amoxicillin or Keflex, duration of treatment to be total of 2 weeks of antibiotics including the patient has received in the hospital.  Followup results of blood cultures obtained 09/07/2013 showing no growth to date.  Patient discharged on Keflex therapy Active Problems:  Sepsis  Present on admission, evidenced by positive blood cultures from both sets, temperature of 102.8, white count of 15,500.   Source of infection likely coming from right lower extremitty cellulitis. Repeat blood cultures on 09/07/2013 negative to date.   Will receive a total of 2 weeks of antibiotic therapy.  Hypertension  Blood pressure still relatively uncontrolled, 161/73. When necessary hydralazine did not provide better blood pressure control.  Clonidine was 0.2 mg twice a day, changed to every 8 hours given elevated blood presures, hydralazine 25 mg PO TID added.  Continue patient's other home blood pressure medications including Microzide, losartan, Lasix Will need close monitoring of blood pressures  Diabetes, controlled with complication of skin ulcer  A1c on this admission 6.6 indicating good glycemic control  Continue current insulin regimen, NovoLog 70/30 mix 32 units at bedtime and 52 units in the morning  Continue metformin  H/O CAD and stent placement  On anticoagulation with xarelto  Continue statin therapy, lasix, ARB's  Right lower extremity swelling  RLE doppler  negative for DVT  Given extra dose of lasix 40 mg IV once 09/07/2013 and swelling is somewhat better.    Discharge Exam: Filed Vitals:   09/10/13 0503  BP: 160/74  Pulse: 61  Temp:   Resp: 16    General: Pt is awake, alert, and not in acute distress  Cardiovascular: Regular rate and rhythm, S1/S2 appreciated  Respiratory: Bilateral air entry, no wheezing  Abdomen: Soft, non tender, non distended, bowel sounds present  Extremities: RLE swelling better, left anterior shin ulcer looks better; right great toe fissure, pulses DP and PT palpable bilaterally  Neuro: Focal neurological deficits   Discharge Instructions You were cared for by a hospitalist during your hospital stay. If you have any questions about your discharge medications or the care you received while you were in the hospital after you are discharged, you can call the unit and asked to speak with the hospitalist on call if the hospitalist that took care of you is not available. Once you are discharged, your primary care physician will handle any further medical issues. Please note that NO REFILLS for any discharge medications will be authorized once you are discharged, as it is imperative that you return to your primary care physician (or establish a relationship with a primary care physician if you do not have one) for your aftercare needs so that they can reassess your need for medications and monitor your lab values.  Discharge Instructions   Call MD for:  difficulty breathing, headache or visual disturbances    Complete by:  As directed      Call MD for:  extreme fatigue    Complete by:  As directed      Call MD for:  hives    Complete by:  As directed      Call MD for:  persistant dizziness or light-headedness    Complete by:  As directed      Call MD for:  persistant nausea and vomiting    Complete by:  As directed      Call MD for:  redness, tenderness, or signs of infection (pain, swelling, redness, odor or  green/yellow discharge around incision site)    Complete by:  As directed      Call MD for:  severe uncontrolled pain    Complete by:  As directed      Call MD for:  temperature >100.4    Complete by:  As directed      Diet - low sodium heart healthy    Complete by:  As directed      Increase activity slowly    Complete by:  As directed             Medication List         atorvastatin 40 MG tablet  Commonly known as:  LIPITOR  Take 40 mg by mouth daily.     cephALEXin 500 MG capsule  Commonly known as:  KEFLEX  Take 1 capsule (500 mg total) by mouth 3 (three) times daily.     cloNIDine 0.2 MG tablet  Commonly known as:  CATAPRES  Take 1 tablet (0.2 mg total) by mouth 3 (three) times daily.     furosemide 80  MG tablet  Commonly known as:  LASIX  Take 80 mg by mouth 2 (two) times daily.     hydrALAZINE 25 MG tablet  Commonly known as:  APRESOLINE  Take 1 tablet (25 mg total) by mouth 3 (three) times daily.     hydrOXYzine 25 MG tablet  Commonly known as:  ATARAX/VISTARIL  Take 1 tablet (25 mg total) by mouth 3 (three) times daily as needed for itching (refractory to benadryl).     insulin NPH-regular Human (70-30) 100 UNIT/ML injection  Commonly known as:  NOVOLIN 70/30  Inject into the skin. 52 units in the morning and 32 units in the evening     losartan-hydrochlorothiazide 100-12.5 MG per tablet  Commonly known as:  HYZAAR  Take 1 tablet by mouth daily.     metFORMIN 1000 MG tablet  Commonly known as:  GLUCOPHAGE  Take 1,000 mg by mouth 2 (two) times daily with a meal.     metoprolol tartrate 25 MG tablet  Commonly known as:  LOPRESSOR  Take 25 mg by mouth 2 (two) times daily.     rivaroxaban 20 MG Tabs tablet  Commonly known as:  XARELTO  Take 20 mg by mouth daily.     triamcinolone ointment 0.1 %  Commonly known as:  KENALOG  Apply 1 application topically 2 (two) times daily.       Allergies  Allergen Reactions  . Iohexol     hives  . Ivp Dye  [Iodinated Diagnostic Agents]     hives       Follow-up Information   Follow up with James Austria, MD In 1 week.   Specialty:  Family Medicine   Contact information:   [place] [address] [phone]        The results of significant diagnostics from this hospitalization (including imaging, microbiology, ancillary and laboratory) are listed below for reference.    Significant Diagnostic Studies: Dg Tibia/fibula Left  09/04/2013   CLINICAL DATA:  Abscess left mid tib-fib.  EXAM: LEFT TIBIA AND FIBULA - 2 VIEW  COMPARISON:  None.  FINDINGS: No radiopaque foreign body or soft tissue gas collections identified at the area marked as the location of the abscess. No underlying bone changes. No evidence of acute fracture or dislocation. Degenerative changes in the left knee. Vascular calcifications are present.  IMPRESSION: No acute bony abnormalities. Soft tissues are unremarkable except for vascular calcifications.   Electronically Signed   By: Lucienne Capers M.D.   On: 09/04/2013 23:45    Microbiology: Recent Results (from the past 240 hour(s))  CULTURE, BLOOD (ROUTINE X 2)     Status: None   Collection Time    09/04/13 11:20 PM      Result Value Ref Range Status   Specimen Description BLOOD LEFT ASSIST CONTROL   Final   Special Requests     Final   Value: BOTTLES DRAWN AEROBIC AND ANAEROBIC 5CC BOTH BOTTLES   Culture  Setup Time     Final   Value: 09/05/2013 16:55     Performed at [facility]   Culture     Final   Value:        BLOOD CULTURE RECEIVED NO GROWTH TO DATE CULTURE WILL BE HELD FOR 5 DAYS BEFORE ISSUING A FINAL NEGATIVE REPORT     Performed at [facility]   Report Status PENDING   Incomplete  URINE CULTURE     Status: None  Collection Time    09/05/13  2:00 AM      Result Value Ref Range Status   Specimen Description URINE, CLEAN CATCH   Final   Special Requests NONE   Final   Culture  Setup Time      Final   Value: 09/05/2013 15:12     Performed at [facility]     Final   Value: 45,000 COLONIES/ML     Performed at [facility]   Culture     Final   Value: Multiple bacterial morphotypes present, none predominant. Suggest appropriate recollection if clinically indicated.     Performed at [facility]   Report Status 09/06/2013 FINAL   Final  CULTURE, BLOOD (ROUTINE X 2)     Status: None   Collection Time    09/05/13  3:27 AM      Result Value Ref Range Status   Specimen Description BLOOD LEFT ANTECUBITAL   Final   Special Requests BOTTLES DRAWN AEROBIC AND ANAEROBIC [facility] EACH   Final   Culture  Setup Time     Final   Value: 09/05/2013 12:07     Performed at [facility]   Culture     Final   Value: GROUP B STREP(S.AGALACTIAE)ISOLATED     Note: Gram Stain Report Called to,Read Back By and Verified With: PANDORA JACKSON 09/05/13 @ 9:34PM BY RUSCOE A.     Performed at [facility]   Report Status 09/07/2013 FINAL   Final   Organism ID, Bacteria GROUP B STREP(S.AGALACTIAE)ISOLATED   Final  CULTURE, BLOOD (ROUTINE X 2)     Status: None   Collection Time    09/07/13  4:00 PM      Result Value Ref Range Status   Specimen Description BLOOD RIGHT HAND   Final   Special Requests BOTTLES DRAWN AEROBIC AND ANAEROBIC 10CC   Final   Culture  Setup Time     Final   Value: 09/07/2013 20:06     Performed at [facility]   Culture     Final   Value:        BLOOD CULTURE RECEIVED NO GROWTH TO DATE CULTURE WILL BE HELD FOR 5 DAYS BEFORE ISSUING A FINAL NEGATIVE REPORT     Performed at [facility]   Report Status PENDING   Incomplete  CULTURE, BLOOD (ROUTINE X 2)     Status: None   Collection Time    09/07/13  4:10 PM      Result Value Ref Range Status   Specimen Description BLOOD RIGHT HAND   Final   Special Requests BOTTLES DRAWN AEROBIC AND ANAEROBIC 10CC   Final   Culture  Setup Time     Final   Value: 09/07/2013  20:06     Performed at [facility]   Culture     Final   Value:        BLOOD CULTURE RECEIVED NO GROWTH TO DATE CULTURE WILL BE HELD FOR 5 DAYS BEFORE ISSUING A FINAL NEGATIVE REPORT     Performed at [facility]   Report Status PENDING   Incomplete     Labs: Basic Metabolic Panel:  Recent Labs Lab 09/04/13 2320  NA 140  K 3.7  CL 98  CO2 28  GLUCOSE 159*  BUN 10  CREATININE 0.95  CALCIUM 9.5   Liver Function Tests:  Recent Labs Lab 09/04/13 2320  AST 20  ALT 14  ALKPHOS 79  BILITOT 0.5  PROT 8.5*  ALBUMIN 3.9    Recent Labs Lab 09/04/13 2320  LIPASE 24   No results found for this basename: AMMONIA,  in the last 168 hours CBC:  Recent Labs Lab 09/04/13 2320 09/07/13 1610  WBC 15.5* 6.7  NEUTROABS 13.3*  --   HGB 14.5 13.2  HCT 43.9 40.3  MCV 86.6 87.2  PLT 252 239   Cardiac Enzymes: No results found for this basename: CKTOTAL, CKMB, CKMBINDEX, TROPONINI,  in the last 168 hours BNP: BNP (last 3 results)  Recent Labs  10/12/12 2120 12/30/12 0510  PROBNP 386.7* 286.8*   CBG:  Recent Labs Lab 09/09/13 0712 09/09/13 1123 09/09/13 1651 09/09/13 2149 09/10/13 0809  GLUCAP 173* 143* 112* 123* 122*       Signed:  Wisdom Seybold  [facility] 09/10/2013, 10:58 AM

## 2013-09-10 NOTE — Care Management Note (Unsigned)
    Page 1 of 1   09/10/2013     2:03:29 PM CARE MANAGEMENT NOTE 09/10/2013  Patient:  [name], [name]   Account Number:  [id number]  Date Initiated:  09/08/2013  Documentation initiated by:  [facility]  Subjective/Objective Assessment:   58 year old male admitted with cellulitis.     Action/Plan:   From home.   Anticipated DC Date:  09/10/2013   Anticipated DC Plan:  [place]  CM consult      Choice offered to / List presented to:             Status of service:  Completed, signed off Medicare Important Message given?  YES (If response is "NO", the following Medicare IM given date fields will be blank) Date Medicare IM given:  09/09/2013 Medicare IM given by:  [facility] Date Additional Medicare IM given:   Additional Medicare IM given by:    Discharge Disposition:  HOME/SELF CARE  Per UR Regulation:  Reviewed for med. necessity/level of care/duration of stay  If discussed at [place] of Stay Meetings, dates discussed:    Comments:

## 2013-09-11 LAB — CULTURE, BLOOD (ROUTINE X 2): CULTURE: NO GROWTH

## 2013-09-13 LAB — CULTURE, BLOOD (ROUTINE X 2)
CULTURE: NO GROWTH
Culture: NO GROWTH

## 2013-09-14 DIAGNOSIS — I119 Hypertensive heart disease without heart failure: Secondary | ICD-10-CM | POA: Diagnosis not present

## 2013-09-14 DIAGNOSIS — I739 Peripheral vascular disease, unspecified: Secondary | ICD-10-CM | POA: Diagnosis not present

## 2013-09-14 DIAGNOSIS — L02419 Cutaneous abscess of limb, unspecified: Secondary | ICD-10-CM | POA: Diagnosis not present

## 2013-09-14 DIAGNOSIS — E119 Type 2 diabetes mellitus without complications: Secondary | ICD-10-CM | POA: Diagnosis not present

## 2013-09-14 DIAGNOSIS — I509 Heart failure, unspecified: Secondary | ICD-10-CM | POA: Diagnosis not present

## 2013-09-14 DIAGNOSIS — E785 Hyperlipidemia, unspecified: Secondary | ICD-10-CM | POA: Diagnosis not present

## 2014-07-12 ENCOUNTER — Emergency Department (HOSPITAL_COMMUNITY)
Admission: EM | Admit: 2014-07-12 | Discharge: 2014-07-12 | Disposition: A | Discharge status: Home / Self Care | Payer: Medicare Other | Attending: Emergency Medicine | Admitting: Emergency Medicine

## 2014-07-12 ENCOUNTER — Encounter (HOSPITAL_COMMUNITY): Payer: Self-pay | Admitting: Emergency Medicine

## 2014-07-12 DIAGNOSIS — Z794 Long term (current) use of insulin: Secondary | ICD-10-CM | POA: Insufficient documentation

## 2014-07-12 DIAGNOSIS — Z792 Long term (current) use of antibiotics: Secondary | ICD-10-CM | POA: Insufficient documentation

## 2014-07-12 DIAGNOSIS — R5383 Other fatigue: Secondary | ICD-10-CM | POA: Insufficient documentation

## 2014-07-12 DIAGNOSIS — Z8701 Personal history of pneumonia (recurrent): Secondary | ICD-10-CM | POA: Insufficient documentation

## 2014-07-12 DIAGNOSIS — Z8619 Personal history of other infectious and parasitic diseases: Secondary | ICD-10-CM | POA: Diagnosis not present

## 2014-07-12 DIAGNOSIS — Z9842 Cataract extraction status, left eye: Secondary | ICD-10-CM | POA: Insufficient documentation

## 2014-07-12 DIAGNOSIS — Z8739 Personal history of other diseases of the musculoskeletal system and connective tissue: Secondary | ICD-10-CM | POA: Diagnosis not present

## 2014-07-12 DIAGNOSIS — E119 Type 2 diabetes mellitus without complications: Secondary | ICD-10-CM | POA: Diagnosis not present

## 2014-07-12 DIAGNOSIS — I509 Heart failure, unspecified: Secondary | ICD-10-CM | POA: Insufficient documentation

## 2014-07-12 DIAGNOSIS — M2550 Pain in unspecified joint: Secondary | ICD-10-CM | POA: Insufficient documentation

## 2014-07-12 DIAGNOSIS — E780 Pure hypercholesterolemia: Secondary | ICD-10-CM | POA: Insufficient documentation

## 2014-07-12 DIAGNOSIS — Z87438 Personal history of other diseases of male genital organs: Secondary | ICD-10-CM | POA: Insufficient documentation

## 2014-07-12 DIAGNOSIS — R42 Dizziness and giddiness: Secondary | ICD-10-CM | POA: Insufficient documentation

## 2014-07-12 DIAGNOSIS — I10 Essential (primary) hypertension: Secondary | ICD-10-CM | POA: Diagnosis not present

## 2014-07-12 DIAGNOSIS — Z79899 Other long term (current) drug therapy: Secondary | ICD-10-CM | POA: Insufficient documentation

## 2014-07-12 DIAGNOSIS — Z9841 Cataract extraction status, right eye: Secondary | ICD-10-CM | POA: Diagnosis not present

## 2014-07-12 DIAGNOSIS — I252 Old myocardial infarction: Secondary | ICD-10-CM | POA: Diagnosis not present

## 2014-07-12 DIAGNOSIS — R609 Edema, unspecified: Secondary | ICD-10-CM | POA: Insufficient documentation

## 2014-07-12 DIAGNOSIS — R1110 Vomiting, unspecified: Secondary | ICD-10-CM | POA: Diagnosis present

## 2014-07-12 DIAGNOSIS — R112 Nausea with vomiting, unspecified: Secondary | ICD-10-CM

## 2014-07-12 DIAGNOSIS — H5442 Blindness, left eye, normal vision right eye: Secondary | ICD-10-CM | POA: Diagnosis not present

## 2014-07-12 DIAGNOSIS — Z7952 Long term (current) use of systemic steroids: Secondary | ICD-10-CM | POA: Insufficient documentation

## 2014-07-12 LAB — URINALYSIS, ROUTINE W REFLEX MICROSCOPIC
BILIRUBIN URINE: NEGATIVE
GLUCOSE, UA: NEGATIVE mg/dL
Hgb urine dipstick: NEGATIVE
KETONES UR: NEGATIVE mg/dL
LEUKOCYTES UA: NEGATIVE
Nitrite: NEGATIVE
PROTEIN: 30 mg/dL — AB
Specific Gravity, Urine: 1.009 (ref 1.005–1.030)
Urobilinogen, UA: 0.2 mg/dL (ref 0.0–1.0)
pH: 6 (ref 5.0–8.0)

## 2014-07-12 LAB — CBC WITH DIFFERENTIAL/PLATELET
Basophils Absolute: 0 10*3/uL (ref 0.0–0.1)
Basophils Relative: 0 % (ref 0–1)
EOS PCT: 2 % (ref 0–5)
Eosinophils Absolute: 0.1 10*3/uL (ref 0.0–0.7)
HCT: 41.9 % (ref 39.0–52.0)
Hemoglobin: 13.5 g/dL (ref 13.0–17.0)
LYMPHS ABS: 1.7 10*3/uL (ref 0.7–4.0)
Lymphocytes Relative: 28 % (ref 12–46)
MCH: 28.8 pg (ref 26.0–34.0)
MCHC: 32.2 g/dL (ref 30.0–36.0)
MCV: 89.5 fL (ref 78.0–100.0)
MONO ABS: 0.4 10*3/uL (ref 0.1–1.0)
Monocytes Relative: 7 % (ref 3–12)
Neutro Abs: 3.8 10*3/uL (ref 1.7–7.7)
Neutrophils Relative %: 63 % (ref 43–77)
PLATELETS: 199 10*3/uL (ref 150–400)
RBC: 4.68 MIL/uL (ref 4.22–5.81)
RDW: 12.7 % (ref 11.5–15.5)
WBC: 6.1 10*3/uL (ref 4.0–10.5)

## 2014-07-12 LAB — COMPREHENSIVE METABOLIC PANEL
ALBUMIN: 3.9 g/dL (ref 3.5–5.0)
ALT: 19 U/L (ref 17–63)
AST: 23 U/L (ref 15–41)
Alkaline Phosphatase: 57 U/L (ref 38–126)
Anion gap: 10 (ref 5–15)
BILIRUBIN TOTAL: 0.4 mg/dL (ref 0.3–1.2)
BUN: 10 mg/dL (ref 6–20)
CHLORIDE: 108 mmol/L (ref 101–111)
CO2: 27 mmol/L (ref 22–32)
Calcium: 8.6 mg/dL — ABNORMAL LOW (ref 8.9–10.3)
Creatinine, Ser: 0.97 mg/dL (ref 0.61–1.24)
Glucose, Bld: 102 mg/dL — ABNORMAL HIGH (ref 65–99)
Potassium: 3.4 mmol/L — ABNORMAL LOW (ref 3.5–5.1)
Sodium: 145 mmol/L (ref 135–145)
Total Protein: 7.2 g/dL (ref 6.5–8.1)

## 2014-07-12 LAB — URINE MICROSCOPIC-ADD ON

## 2014-07-12 LAB — CBG MONITORING, ED: Glucose-Capillary: 93 mg/dL (ref 65–99)

## 2014-07-12 MED ORDER — ONDANSETRON 4 MG PO TBDP: 4.0000 mg | ORAL_TABLET | Freq: Four times a day (QID) | ORAL | Status: DC | PRN

## 2014-07-12 MED ORDER — ONDANSETRON HCL 4 MG/2ML IJ SOLN
4.0000 mg | Freq: Once | INTRAMUSCULAR | Status: AC
  Administered 2014-07-12: 4 mg via INTRAVENOUS
  Filled 2014-07-12: qty 2

## 2014-07-12 MED ORDER — SODIUM CHLORIDE 0.9 % IV BOLUS (SEPSIS)
500.0000 mL | Freq: Once | INTRAVENOUS | Status: AC
  Administered 2014-07-12: 500 mL via INTRAVENOUS

## 2014-07-12 NOTE — ED Notes (Signed)
Pt states that on Saturday he started vomiting afternoon after going to his cousin's funeral, then yesterday he felt dizzy. Today he thought he felt better but "have been vomiting mucous this morning".

## 2014-07-12 NOTE — ED Notes (Signed)
Patient is made aware that an urine sample is needed. Patient states that he is unable to void at this time. Patient is given an urinal and encouraged to void when able.

## 2014-07-12 NOTE — ED Provider Notes (Signed)
CSN: [id number]     Arrival date & time 07/12/14  1228 History   First MD Initiated Contact with Patient 07/12/14 1334     Chief Complaint  Patient presents with  . Emesis  . Dizziness     (Consider location/radiation/quality/duration/timing/severity/associated sxs/prior Treatment) HPI Comments: Patient is a 59 year old male who presents to the emergency department with a complaint of vomiting and dizziness. Patient has a history of diabetes mellitus, congestive heart failure, myocardial infarction, adrenal insufficiency, and hypertension who presents to the emergency department with the symptoms of vomiting and dizziness. The patient and the patient's sister states that on Saturday the patient was very busy in a graduation, as and then had to go swiftly to a funeral. The patient did not eat the way he is usually scheduled to eat. He became somewhat fatigued, and did not feel real well. Began having some vomiting on Saturday, also began to have some vomiting on Sunday. This morning the patient was able to eat some soup. He continued to not feel well and called his daughter who is a nurse who instructed him on drinking something that may raise his sugar in the event that his sugar was low. The patient states when he checked his sugar he was 146 early in the morning, and 156 after the the drink. He continued to feel bad and his sister brought him to the emergency department. Patient had no loss of consciousness. He says he is not feeling excessively thirsty. He does feel somewhat weak and has some dizziness with change of position. There's been no excessive diarrhea or constipation. His been no high fever reported. Patient denies any dysuria, cough, or any ulcers that are not healing.    Patient is a 58 y.o. male presenting with vomiting and dizziness. The history is provided by the patient.  Emesis Severity:  Moderate Duration:  2 days Timing:  Intermittent Associated symptoms: arthralgias    Dizziness Associated symptoms: vomiting     Past Medical History  Diagnosis Date  . Blind left eye   . Hypertension   . Diabetes mellitus without complication   . Gout   . Herpes   . CHF (congestive heart failure)   . Cardiomyopathy   . Hypercholesterolemia   . MI (myocardial infarction)   . Pneumonia   . Adrenal insufficiency   . Peripheral neuropathy   . Carpal tunnel syndrome   . PVD (peripheral vascular disease)   . ED (erectile dysfunction)   . Allergy   . Cataract     bil removed  . Arthritis   . Gout    Past Surgical History  Procedure Laterality Date  . Cholecystectomy    . Stents    . Dilatera cataracts removed    . Right knee surgary     Family History  Problem Relation Age of Onset  . Breast cancer Mother   . Hypertension Father     blood clot in leg  . Diabetes Maternal Aunt   . Heart attack Sister   . Colon cancer Neg Hx   . Esophageal cancer Neg Hx   . Rectal cancer Neg Hx   . Stomach cancer Neg Hx    History  Substance Use Topics  . Smoking status: Never Smoker   . Smokeless tobacco: Never Used  . Alcohol Use: No    Review of Systems  Constitutional: Positive for fatigue.  Eyes:       Blind left eye  Gastrointestinal: Positive for vomiting.  Musculoskeletal:  Positive for arthralgias.  Neurological: Positive for dizziness.  All other systems reviewed and are negative.     Allergies  Iohexol and Ivp dye  Home Medications   Prior to Admission medications   Medication Sig Start Date End Date Taking? Authorizing Provider  atorvastatin (LIPITOR) 40 MG tablet Take 40 mg by mouth daily.    Historical Provider, MD  cephALEXin (KEFLEX) 500 MG capsule Take 1 capsule (500 mg total) by mouth 3 (three) times daily. 09/10/13   [name], MD  cloNIDine (CATAPRES) 0.2 MG tablet Take 1 tablet (0.2 mg total) by mouth 3 (three) times daily. 09/10/13   [name], MD  furosemide (LASIX) 80 MG tablet Take 80 mg by mouth 2 (two) times  daily.    Historical Provider, MD  hydrALAZINE (APRESOLINE) 25 MG tablet Take 1 tablet (25 mg total) by mouth 3 (three) times daily. 09/10/13   [name], MD  hydrOXYzine (ATARAX/VISTARIL) 25 MG tablet Take 1 tablet (25 mg total) by mouth 3 (three) times daily as needed for itching (refractory to benadryl). 01/01/13   Erline Hau, MD  insulin NPH-regular (NOVOLIN 70/30) (70-30) 100 UNIT/ML injection Inject into the skin. 52 units in the morning and 32 units in the evening    Historical Provider, MD  losartan-hydrochlorothiazide (HYZAAR) 100-12.5 MG per tablet Take 1 tablet by mouth daily.    Historical Provider, MD  metFORMIN (GLUCOPHAGE) 1000 MG tablet Take 1,000 mg by mouth 2 (two) times daily with a meal.    Historical Provider, MD  metoprolol tartrate (LOPRESSOR) 25 MG tablet Take 25 mg by mouth 2 (two) times daily.    Historical Provider, MD  rivaroxaban (XARELTO) 20 MG TABS tablet Take 20 mg by mouth daily.    Historical Provider, MD  triamcinolone ointment (KENALOG) 0.1 % Apply 1 application topically 2 (two) times daily.    Historical Provider, MD   BP 163/75 mmHg  Pulse 60  Temp(Src) 97.9 F (36.6 C) (Oral)  Resp 18  SpO2 99% Physical Exam  Constitutional: He is oriented to person, place, and time. He appears well-developed and well-nourished.  Non-toxic appearance.  HENT:  Head: Normocephalic.  Right Ear: Tympanic membrane and external ear normal.  Left Ear: Tympanic membrane and external ear normal.  Eyes: EOM and lids are normal. Pupils are equal, round, and reactive to light.  Neck: Normal range of motion. Neck supple. Carotid bruit is not present.  Cardiovascular: Normal rate, regular rhythm, normal heart sounds, intact distal pulses and normal pulses.   Pulmonary/Chest: Breath sounds normal. No respiratory distress.  Abdominal: Soft. Bowel sounds are normal. There is no tenderness. There is no guarding.  Musculoskeletal: Normal range of motion.  There is  increased puffiness noted of the lower extremities. There is some 1+ edema of the lower leg.  There is full range of motion of the upper and lower extremity. Radial pulses are 2+. Capillary refill is less than 2 seconds.  Lymphadenopathy:       Head (right side): No submandibular adenopathy present.       Head (left side): No submandibular adenopathy present.    He has no cervical adenopathy.  Neurological: He is alert and oriented to person, place, and time. He has normal strength. No cranial nerve deficit or sensory deficit.  Skin: Skin is warm and dry.  Psychiatric: He has a normal mood and affect. His speech is normal.  Nursing note and vitals reviewed.   ED Course  Procedures (including critical care  time) Labs Review Labs Reviewed  CBC WITH DIFFERENTIAL/PLATELET  COMPREHENSIVE METABOLIC PANEL  URINALYSIS, ROUTINE W REFLEX MICROSCOPIC  CBG MONITORING, ED    Imaging Review No results found.   EKG Interpretation None      MDM  Vital signs are well within normal limits. With the exception of blood pressure being 188/76. Pulse oximetry is 97-99%.  Complete blood count is well within normal limits. Comprehensive metabolic panel shows no acute changes. Urinalysis is also negative for acute changes.   Patient was given Zofran and IV fluids. Patient states he feels much better after being treated. Electrocardiogram shows no acute event at this time.  Patient ambulatory without problem.  The plan at this time is for the patient to be treated with Zofran. I have encouraged him to increase his fluids, he is meals on time. He is also to monitor his blood sugars closely. The patient is to return to the emergency department immediately if any changes, problems, or concerns.    Final diagnoses:  None    *I have reviewed nursing notes, vital signs, and all appropriate lab and imaging results for this patient.[address] PA-C 07/12/14 [place],  MD 07/13/14 [phone]

## 2014-07-12 NOTE — Discharge Instructions (Signed)
Your labs are well within normal limits. No acute changes noted at this time. Please increase your fluids. Please monitor your blood sugars closely, and if your meals and snack on time. Please return to the emergency department immediately if any changes, problems, or concerns. Please see Dr. Alyson Ingles Nausea and Vomiting Nausea means you feel sick to your stomach. Throwing up (vomiting) is a reflex where stomach contents come out of your mouth. HOME CARE   Take medicine as told by your doctor.  Do not force yourself to eat. However, you do need to drink fluids.  If you feel like eating, eat a normal diet as told by your doctor.  Eat rice, wheat, potatoes, bread, lean meats, yogurt, fruits, and vegetables.  Avoid high-fat foods.  Drink enough fluids to keep your pee (urine) clear or pale yellow.  Ask your doctor how to replace body fluid losses (rehydrate). Signs of body fluid loss (dehydration) include:  Feeling very thirsty.  Dry lips and mouth.  Feeling dizzy.  Dark pee.  Peeing less than normal.  Feeling confused.  Fast breathing or heart rate. GET HELP RIGHT AWAY IF:   You have blood in your throw up.  You have black or bloody poop (stool).  You have a bad headache or stiff neck.  You feel confused.  You have bad belly (abdominal) pain.  You have chest pain or trouble breathing.  You do not pee at least once every 8 hours.  You have cold, clammy skin.  You keep throwing up after 24 to 48 hours.  You have a fever. MAKE SURE YOU:   Understand these instructions.  Will watch your condition.  Will get help right away if you are not doing well or get worse. Document Released: 08/01/2007 Document Revised: 05/07/2011 Document Reviewed: 07/14/2010 [facility] Patient Information 2015 [place], [place]. This information is not intended to replace advice given to you by your health care provider. Make sure you discuss any questions you have with your health care  provider.  in the office for additional evaluation and management.

## 2015-02-07 DIAGNOSIS — L309 Dermatitis, unspecified: Secondary | ICD-10-CM | POA: Diagnosis not present

## 2015-02-07 DIAGNOSIS — E1165 Type 2 diabetes mellitus with hyperglycemia: Secondary | ICD-10-CM | POA: Diagnosis not present

## 2015-02-07 DIAGNOSIS — Z23 Encounter for immunization: Secondary | ICD-10-CM | POA: Diagnosis not present

## 2015-02-07 DIAGNOSIS — Z1389 Encounter for screening for other disorder: Secondary | ICD-10-CM | POA: Diagnosis not present

## 2015-02-07 DIAGNOSIS — E7800 Pure hypercholesterolemia, unspecified: Secondary | ICD-10-CM | POA: Diagnosis not present

## 2015-02-07 DIAGNOSIS — Z0000 Encounter for general adult medical examination without abnormal findings: Secondary | ICD-10-CM | POA: Diagnosis not present

## 2015-02-07 DIAGNOSIS — E1151 Type 2 diabetes mellitus with diabetic peripheral angiopathy without gangrene: Secondary | ICD-10-CM | POA: Diagnosis not present

## 2015-02-07 DIAGNOSIS — I5020 Unspecified systolic (congestive) heart failure: Secondary | ICD-10-CM | POA: Diagnosis not present

## 2015-02-07 DIAGNOSIS — I129 Hypertensive chronic kidney disease with stage 1 through stage 4 chronic kidney disease, or unspecified chronic kidney disease: Secondary | ICD-10-CM | POA: Diagnosis not present

## 2015-02-07 DIAGNOSIS — Z125 Encounter for screening for malignant neoplasm of prostate: Secondary | ICD-10-CM | POA: Diagnosis not present

## 2015-02-07 DIAGNOSIS — I739 Peripheral vascular disease, unspecified: Secondary | ICD-10-CM | POA: Diagnosis not present

## 2015-05-09 DIAGNOSIS — Z6839 Body mass index (BMI) 39.0-39.9, adult: Secondary | ICD-10-CM | POA: Diagnosis not present

## 2015-05-09 DIAGNOSIS — E1151 Type 2 diabetes mellitus with diabetic peripheral angiopathy without gangrene: Secondary | ICD-10-CM | POA: Diagnosis not present

## 2015-05-09 DIAGNOSIS — I129 Hypertensive chronic kidney disease with stage 1 through stage 4 chronic kidney disease, or unspecified chronic kidney disease: Secondary | ICD-10-CM | POA: Diagnosis not present

## 2015-05-09 DIAGNOSIS — E1165 Type 2 diabetes mellitus with hyperglycemia: Secondary | ICD-10-CM | POA: Diagnosis not present

## 2015-05-09 DIAGNOSIS — Z1389 Encounter for screening for other disorder: Secondary | ICD-10-CM | POA: Diagnosis not present

## 2015-05-09 DIAGNOSIS — I5020 Unspecified systolic (congestive) heart failure: Secondary | ICD-10-CM | POA: Diagnosis not present

## 2015-05-09 DIAGNOSIS — Z7984 Long term (current) use of oral hypoglycemic drugs: Secondary | ICD-10-CM | POA: Diagnosis not present

## 2015-05-09 DIAGNOSIS — I739 Peripheral vascular disease, unspecified: Secondary | ICD-10-CM | POA: Diagnosis not present

## 2015-05-09 DIAGNOSIS — L309 Dermatitis, unspecified: Secondary | ICD-10-CM | POA: Diagnosis not present

## 2015-05-09 DIAGNOSIS — Z794 Long term (current) use of insulin: Secondary | ICD-10-CM | POA: Diagnosis not present

## 2015-05-09 DIAGNOSIS — E7800 Pure hypercholesterolemia, unspecified: Secondary | ICD-10-CM | POA: Diagnosis not present

## 2015-08-29 DIAGNOSIS — Z794 Long term (current) use of insulin: Secondary | ICD-10-CM | POA: Diagnosis not present

## 2015-08-29 DIAGNOSIS — I739 Peripheral vascular disease, unspecified: Secondary | ICD-10-CM | POA: Diagnosis not present

## 2015-08-29 DIAGNOSIS — E1151 Type 2 diabetes mellitus with diabetic peripheral angiopathy without gangrene: Secondary | ICD-10-CM | POA: Diagnosis not present

## 2015-08-29 DIAGNOSIS — E1165 Type 2 diabetes mellitus with hyperglycemia: Secondary | ICD-10-CM | POA: Diagnosis not present

## 2015-08-29 DIAGNOSIS — Z6839 Body mass index (BMI) 39.0-39.9, adult: Secondary | ICD-10-CM | POA: Diagnosis not present

## 2015-08-29 DIAGNOSIS — I5020 Unspecified systolic (congestive) heart failure: Secondary | ICD-10-CM | POA: Diagnosis not present

## 2015-08-29 DIAGNOSIS — Z7984 Long term (current) use of oral hypoglycemic drugs: Secondary | ICD-10-CM | POA: Diagnosis not present

## 2015-08-29 DIAGNOSIS — L309 Dermatitis, unspecified: Secondary | ICD-10-CM | POA: Diagnosis not present

## 2015-08-29 DIAGNOSIS — E7800 Pure hypercholesterolemia, unspecified: Secondary | ICD-10-CM | POA: Diagnosis not present

## 2015-08-29 DIAGNOSIS — I10 Essential (primary) hypertension: Secondary | ICD-10-CM | POA: Diagnosis not present

## 2015-09-06 DIAGNOSIS — I10 Essential (primary) hypertension: Secondary | ICD-10-CM | POA: Diagnosis not present

## 2015-09-06 DIAGNOSIS — E1165 Type 2 diabetes mellitus with hyperglycemia: Secondary | ICD-10-CM | POA: Diagnosis not present

## 2015-09-06 DIAGNOSIS — Z7984 Long term (current) use of oral hypoglycemic drugs: Secondary | ICD-10-CM | POA: Diagnosis not present

## 2015-09-06 DIAGNOSIS — I119 Hypertensive heart disease without heart failure: Secondary | ICD-10-CM | POA: Diagnosis not present

## 2015-09-06 DIAGNOSIS — I509 Heart failure, unspecified: Secondary | ICD-10-CM | POA: Diagnosis not present

## 2015-09-06 DIAGNOSIS — I5020 Unspecified systolic (congestive) heart failure: Secondary | ICD-10-CM | POA: Diagnosis not present

## 2015-09-06 DIAGNOSIS — E1151 Type 2 diabetes mellitus with diabetic peripheral angiopathy without gangrene: Secondary | ICD-10-CM | POA: Diagnosis not present

## 2015-09-06 DIAGNOSIS — Z794 Long term (current) use of insulin: Secondary | ICD-10-CM | POA: Diagnosis not present

## 2016-03-28 ENCOUNTER — Emergency Department (HOSPITAL_COMMUNITY): Payer: Medicare Other

## 2016-03-28 ENCOUNTER — Inpatient Hospital Stay (HOSPITAL_COMMUNITY)
Admission: EM | Admit: 2016-03-28 | Discharge: 2016-04-03 | DRG: 065 | Disposition: A | Discharge status: Rehabilitation Facility | Payer: Medicare Other | Attending: Internal Medicine | Admitting: Internal Medicine

## 2016-03-28 ENCOUNTER — Encounter (HOSPITAL_COMMUNITY): Payer: Self-pay | Admitting: Emergency Medicine

## 2016-03-28 DIAGNOSIS — I160 Hypertensive urgency: Secondary | ICD-10-CM | POA: Diagnosis present

## 2016-03-28 DIAGNOSIS — E669 Obesity, unspecified: Secondary | ICD-10-CM | POA: Diagnosis present

## 2016-03-28 DIAGNOSIS — I6339 Cerebral infarction due to thrombosis of other cerebral artery: Secondary | ICD-10-CM | POA: Diagnosis not present

## 2016-03-28 DIAGNOSIS — Z7901 Long term (current) use of anticoagulants: Secondary | ICD-10-CM

## 2016-03-28 DIAGNOSIS — Z79899 Other long term (current) drug therapy: Secondary | ICD-10-CM

## 2016-03-28 DIAGNOSIS — H5462 Unqualified visual loss, left eye, normal vision right eye: Secondary | ICD-10-CM | POA: Diagnosis present

## 2016-03-28 DIAGNOSIS — I248 Other forms of acute ischemic heart disease: Secondary | ICD-10-CM | POA: Diagnosis not present

## 2016-03-28 DIAGNOSIS — R29701 NIHSS score 1: Secondary | ICD-10-CM | POA: Diagnosis present

## 2016-03-28 DIAGNOSIS — G8191 Hemiplegia, unspecified affecting right dominant side: Secondary | ICD-10-CM | POA: Diagnosis present

## 2016-03-28 DIAGNOSIS — E1151 Type 2 diabetes mellitus with diabetic peripheral angiopathy without gangrene: Secondary | ICD-10-CM | POA: Diagnosis not present

## 2016-03-28 DIAGNOSIS — I429 Cardiomyopathy, unspecified: Secondary | ICD-10-CM

## 2016-03-28 DIAGNOSIS — M47812 Spondylosis without myelopathy or radiculopathy, cervical region: Secondary | ICD-10-CM | POA: Diagnosis present

## 2016-03-28 DIAGNOSIS — R200 Anesthesia of skin: Secondary | ICD-10-CM | POA: Diagnosis not present

## 2016-03-28 DIAGNOSIS — R270 Ataxia, unspecified: Secondary | ICD-10-CM | POA: Diagnosis present

## 2016-03-28 DIAGNOSIS — G459 Transient cerebral ischemic attack, unspecified: Secondary | ICD-10-CM

## 2016-03-28 DIAGNOSIS — E1165 Type 2 diabetes mellitus with hyperglycemia: Secondary | ICD-10-CM | POA: Diagnosis present

## 2016-03-28 DIAGNOSIS — I252 Old myocardial infarction: Secondary | ICD-10-CM

## 2016-03-28 DIAGNOSIS — Z6836 Body mass index (BMI) 36.0-36.9, adult: Secondary | ICD-10-CM

## 2016-03-28 DIAGNOSIS — E785 Hyperlipidemia, unspecified: Secondary | ICD-10-CM | POA: Diagnosis present

## 2016-03-28 DIAGNOSIS — I6523 Occlusion and stenosis of bilateral carotid arteries: Secondary | ICD-10-CM | POA: Diagnosis not present

## 2016-03-28 DIAGNOSIS — I428 Other cardiomyopathies: Secondary | ICD-10-CM | POA: Diagnosis not present

## 2016-03-28 DIAGNOSIS — IMO0002 Reserved for concepts with insufficient information to code with codable children: Secondary | ICD-10-CM | POA: Diagnosis present

## 2016-03-28 DIAGNOSIS — I130 Hypertensive heart and chronic kidney disease with heart failure and stage 1 through stage 4 chronic kidney disease, or unspecified chronic kidney disease: Secondary | ICD-10-CM | POA: Diagnosis present

## 2016-03-28 DIAGNOSIS — E11319 Type 2 diabetes mellitus with unspecified diabetic retinopathy without macular edema: Secondary | ICD-10-CM | POA: Diagnosis present

## 2016-03-28 DIAGNOSIS — I6501 Occlusion and stenosis of right vertebral artery: Secondary | ICD-10-CM | POA: Diagnosis present

## 2016-03-28 DIAGNOSIS — E1142 Type 2 diabetes mellitus with diabetic polyneuropathy: Secondary | ICD-10-CM | POA: Diagnosis present

## 2016-03-28 DIAGNOSIS — R29818 Other symptoms and signs involving the nervous system: Secondary | ICD-10-CM | POA: Diagnosis not present

## 2016-03-28 DIAGNOSIS — Z86718 Personal history of other venous thrombosis and embolism: Secondary | ICD-10-CM | POA: Diagnosis present

## 2016-03-28 DIAGNOSIS — R269 Unspecified abnormalities of gait and mobility: Secondary | ICD-10-CM

## 2016-03-28 DIAGNOSIS — Z794 Long term (current) use of insulin: Secondary | ICD-10-CM

## 2016-03-28 DIAGNOSIS — I639 Cerebral infarction, unspecified: Secondary | ICD-10-CM | POA: Diagnosis present

## 2016-03-28 DIAGNOSIS — E876 Hypokalemia: Secondary | ICD-10-CM | POA: Diagnosis present

## 2016-03-28 DIAGNOSIS — I5020 Unspecified systolic (congestive) heart failure: Secondary | ICD-10-CM | POA: Diagnosis present

## 2016-03-28 DIAGNOSIS — E7800 Pure hypercholesterolemia, unspecified: Secondary | ICD-10-CM | POA: Diagnosis present

## 2016-03-28 DIAGNOSIS — R509 Fever, unspecified: Secondary | ICD-10-CM

## 2016-03-28 DIAGNOSIS — N183 Chronic kidney disease, stage 3 (moderate): Secondary | ICD-10-CM | POA: Diagnosis present

## 2016-03-28 DIAGNOSIS — I2510 Atherosclerotic heart disease of native coronary artery without angina pectoris: Secondary | ICD-10-CM | POA: Diagnosis present

## 2016-03-28 DIAGNOSIS — Z9049 Acquired absence of other specified parts of digestive tract: Secondary | ICD-10-CM

## 2016-03-28 DIAGNOSIS — R202 Paresthesia of skin: Secondary | ICD-10-CM | POA: Diagnosis not present

## 2016-03-28 LAB — COMPREHENSIVE METABOLIC PANEL
ALBUMIN: 3.8 g/dL (ref 3.5–5.0)
ALK PHOS: 61 U/L (ref 38–126)
ALT: 17 U/L (ref 17–63)
ANION GAP: 10 (ref 5–15)
AST: 24 U/L (ref 15–41)
BUN: 18 mg/dL (ref 6–20)
CALCIUM: 8.9 mg/dL (ref 8.9–10.3)
CO2: 26 mmol/L (ref 22–32)
Chloride: 103 mmol/L (ref 101–111)
Creatinine, Ser: 1.18 mg/dL (ref 0.61–1.24)
GFR calc non Af Amer: >60 mL/min (ref >60)
GLUCOSE: 165 mg/dL — AB (ref 65–99)
POTASSIUM: 3.3 mmol/L — AB (ref 3.5–5.1)
SODIUM: 139 mmol/L (ref 135–145)
Total Bilirubin: 0.6 mg/dL (ref 0.3–1.2)
Total Protein: 7.7 g/dL (ref 6.5–8.1)

## 2016-03-28 LAB — DIFFERENTIAL
BASOS PCT: 1 %
Basophils Absolute: 0 10*3/uL (ref 0.0–0.1)
EOS ABS: 0.2 10*3/uL (ref 0.0–0.7)
EOS PCT: 3 %
Lymphocytes Relative: 45 %
Lymphs Abs: 2.7 10*3/uL (ref 0.7–4.0)
MONO ABS: 0.5 10*3/uL (ref 0.1–1.0)
Monocytes Relative: 9 %
NEUTROS PCT: 42 %
Neutro Abs: 2.5 10*3/uL (ref 1.7–7.7)

## 2016-03-28 LAB — I-STAT CHEM 8, ED
BUN: 18 mg/dL (ref 6–20)
CALCIUM ION: 1.14 mmol/L — AB (ref 1.15–1.40)
Chloride: 102 mmol/L (ref 101–111)
Creatinine, Ser: 1.3 mg/dL — ABNORMAL HIGH (ref 0.61–1.24)
Glucose, Bld: 164 mg/dL — ABNORMAL HIGH (ref 65–99)
HEMATOCRIT: 45 % (ref 39.0–52.0)
Hemoglobin: 15.3 g/dL (ref 13.0–17.0)
Potassium: 3.4 mmol/L — ABNORMAL LOW (ref 3.5–5.1)
SODIUM: 143 mmol/L (ref 135–145)
TCO2: 28 mmol/L (ref 0–100)

## 2016-03-28 LAB — CBC
HCT: 43.3 % (ref 39.0–52.0)
Hemoglobin: 14.6 g/dL (ref 13.0–17.0)
MCH: 28.9 pg (ref 26.0–34.0)
MCHC: 33.7 g/dL (ref 30.0–36.0)
MCV: 85.6 fL (ref 78.0–100.0)
PLATELETS: 210 10*3/uL (ref 150–400)
RBC: 5.06 MIL/uL (ref 4.22–5.81)
RDW: 12.7 % (ref 11.5–15.5)
WBC: 5.9 10*3/uL (ref 4.0–10.5)

## 2016-03-28 LAB — I-STAT TROPONIN, ED: Troponin i, poc: 0.07 ng/mL (ref 0.00–0.08)

## 2016-03-28 LAB — APTT: aPTT: 27 seconds (ref 24–36)

## 2016-03-28 LAB — PROTIME-INR
INR: 0.92
PROTHROMBIN TIME: 12.3 s (ref 11.4–15.2)

## 2016-03-28 LAB — ETHANOL

## 2016-03-28 MED ORDER — GADOBENATE DIMEGLUMINE 529 MG/ML IV SOLN
20.0000 mL | Freq: Once | INTRAVENOUS | Status: AC | PRN
  Administered 2016-03-28: 20 mL via INTRAVENOUS

## 2016-03-28 NOTE — ED Triage Notes (Addendum)
Pt reports he was watching television about an hour ago and he experienced numbness in his right hand.  He stated he "worked it out" and it got a little better.  States he then experienced the same numbness/tingling through his whole arm.  When asked if it was still present pt stated "kind of".  Pt A&O x4.  Denies any other symptoms at this time.  Ambulatory in triage. Pt hypertensive in triage.

## 2016-03-28 NOTE — ED Provider Notes (Addendum)
[facility] DEPT Provider Note   CSN: [id number] Arrival date & time: 03/28/16  2040     History   Chief Complaint Chief Complaint  Patient presents with  . Numbness    HPI [name] is a 61 y.o. male.  HPI 61 year old male who presents with right sided numbness. History of HTN, HLD, CAD, PVD, DM. After eating dinner at about 7 PM noticed right hand numbness. Initially went away after shaking his hand. Then subsequently developed right arm and right leg numbness. No new vision or speech changes, weakness, facial droop. No recent illnesses including fever, cough, n/v/d. No abdominal pain, back pain, chest pain, dyspnea, syncope or near syncope. Past Medical History:  Diagnosis Date  . Adrenal insufficiency ([place])   . Allergy   . Arthritis   . Blind left eye   . Cardiomyopathy ([place])   . Carpal tunnel syndrome   . Cataract    bil removed  . CHF (congestive heart failure) ([place])   . Diabetes mellitus without complication ([place])   . ED (erectile dysfunction)   . Gout   . Gout   . Herpes   . Hypercholesterolemia   . Hypertension   . MI (myocardial infarction)   . Peripheral neuropathy ([place])   . Pneumonia   . PVD (peripheral vascular disease) [facility])     Patient Active Problem List   Diagnosis Date Noted  . Numbness on right side 03/29/2016  . Sepsis ([place]) 09/10/2013  . DVT (deep venous thrombosis) ([place]) 01/01/2013  . Rash and nonspecific skin eruption 12/29/2012  . Cellulitis and abscess of leg 12/29/2012  . Diabetes type 2, uncontrolled ([place]) 12/29/2012  . Hypertension   . Cardiomyopathy ([place])   . CHF (congestive heart failure) ([place])   . Peripheral neuropathy ([place])   . Special screening for malignant neoplasms, colon 12/09/2012    Past Surgical History:  Procedure Laterality Date  . CHOLECYSTECTOMY    . dilatera cataracts removed    . right knee surgary    . stents         Home Medications    Prior to Admission medications   Medication Sig Start  Date End Date Taking? Authorizing Provider  cloNIDine (CATAPRES) 0.2 MG tablet Take 1 tablet (0.2 mg total) by mouth 3 (three) times daily. 09/10/13  Yes [name], MD  furosemide (LASIX) 80 MG tablet Take 80 mg by mouth daily.    Yes Historical Provider, MD  insulin NPH-regular (NOVOLIN 70/30) (70-30) 100 UNIT/ML injection Inject into the skin. 52 units in the morning and 32 units in the evening   Yes Historical Provider, MD  losartan-hydrochlorothiazide (HYZAAR) 100-12.5 MG per tablet Take 1 tablet by mouth daily.   Yes Historical Provider, MD  metFORMIN (GLUCOPHAGE) 1000 MG tablet Take 1,000 mg by mouth 2 (two) times daily with a meal.   Yes Historical Provider, MD  metoprolol tartrate (LOPRESSOR) 25 MG tablet Take 25 mg by mouth 2 (two) times daily.   Yes Historical Provider, MD  rivaroxaban (XARELTO) 20 MG TABS tablet Take 20 mg by mouth daily.   Yes Historical Provider, MD  triamcinolone ointment (KENALOG) 0.1 % Apply 1 application topically 2 (two) times daily as needed (rash/irritation on legs).    Yes Historical Provider, MD  cephALEXin (KEFLEX) 500 MG capsule Take 1 capsule (500 mg total) by mouth 3 (three) times daily. Patient not taking: Reported on 07/12/2014 09/10/13   [name], MD  hydrALAZINE (APRESOLINE) 25 MG tablet Take 1  tablet (25 mg total) by mouth 3 (three) times daily. Patient not taking: Reported on 03/28/2016 09/10/13   [name], MD  hydrOXYzine (ATARAX/VISTARIL) 25 MG tablet Take 1 tablet (25 mg total) by mouth 3 (three) times daily as needed for itching (refractory to benadryl). Patient not taking: Reported on 07/12/2014 01/01/13   Erline Hau, MD  ondansetron (ZOFRAN ODT) 4 MG disintegrating tablet Take 1 tablet (4 mg total) by mouth every 6 (six) hours as needed for nausea or vomiting. Patient not taking: Reported on 03/28/2016 07/12/14   Lily Kocher, PA-C    Family History Family History  Problem Relation Age of Onset  . Breast cancer  Mother   . Hypertension Father     blood clot in leg  . Diabetes Maternal Aunt   . Heart attack Sister   . Colon cancer Neg Hx   . Esophageal cancer Neg Hx   . Rectal cancer Neg Hx   . Stomach cancer Neg Hx     Social History Social History  Substance Use Topics  . Smoking status: Never Smoker  . Smokeless tobacco: Never Used  . Alcohol use No     Allergies   Iohexol and Ivp dye [iodinated diagnostic agents]   Review of Systems Review of Systems 10/14 systems reviewed and are negative other than those stated in the HPI   Physical Exam Updated Vital Signs BP (!) 210/81   Pulse 88   Temp 99.1 F (37.3 C)   Resp 19   Ht 5\' 9"  (1.753 m)   Wt 245 lb (111.1 kg)   SpO2 100%   BMI 36.18 kg/m   Physical Exam Physical Exam  Nursing note and vitals reviewed. Constitutional: non-toxic, and in no acute distress Head: Normocephalic and atraumatic.  Mouth/Throat: Oropharynx is clear and moist.  Neck: Normal range of motion. Neck supple.  Cardiovascular: Normal rate and regular rhythm.   Pulmonary/Chest: Effort normal and breath sounds normal.  Abdominal: Soft. There is no tenderness. There is no rebound and no guarding.  Musculoskeletal: Normal range of motion.  Skin: Skin is warm and dry.  Psychiatric: Cooperative Neurological:  Alert, oriented to person, place, time, and situation. Memory grossly in tact. Fluent speech. No dysarthria or aphasia.  Cranial nerves: VF are full. Fundoscopic exam-unable to get good visualization of the discs. Pupils are symmetric, and reactive to light. EOMI without nystagmus. No gaze deviation. Facial muscles symmetric with activation. Sensation to light touch over face in tact bilaterally. Hearing grossly in tact. Palate elevates symmetrically. Head turn and shoulder shrug are intact. Tongue midline.  Reflexes defered.  Muscle bulk and tone normal. No pronator drift. Moves all extremities symmetrically. Sensation to light touch is in tact  throughout in bilateral upper and lower extremities. Coordination reveals no dysmetria with finger to nose.    ED Treatments / Results  Labs (all labs ordered are listed, but only abnormal results are displayed) Labs Reviewed  COMPREHENSIVE METABOLIC PANEL - Abnormal; Notable for the following:       Result Value   Potassium 3.3 (*)    Glucose, Bld 165 (*)    All other components within normal limits  I-STAT CHEM 8, ED - Abnormal; Notable for the following:    Potassium 3.4 (*)    Creatinine, Ser 1.30 (*)    Glucose, Bld 164 (*)    Calcium, Ion 1.14 (*)    All other components within normal limits  ETHANOL  PROTIME-INR  APTT  CBC  DIFFERENTIAL  RAPID URINE DRUG SCREEN, HOSP PERFORMED  URINALYSIS, ROUTINE W REFLEX MICROSCOPIC  I-STAT TROPOININ, ED    EKG  EKG Interpretation  Date/Time:  Wednesday March 28 2016 20:52:29 EST Ventricular Rate:  93 PR Interval:    QRS Duration: 110 QT Interval:  385 QTC Calculation: 479 R Axis:   34 Text Interpretation:  Sinus rhythm Probable left atrial enlargement Repol abnrm suggests ischemia, lateral leads Baseline wander in lead(s) V3 St changes in inferolateral leads seen on prior EKG  Confirmed by Arvis Miguez MD, Jancie Kercher [phone]) on 03/28/2016 9:11:20 PM       Radiology Mr Jodene Nam Head Wo Contrast  Result Date: 03/29/2016 CLINICAL DATA:  RIGHT hand numbness beginning at 7 p.m., progressive RIGHT leg numbness. History of hypertension, diabetes. EXAM: MRI HEAD WITHOUT CONTRAST MRA HEAD WITHOUT CONTRAST MRA NECK WITHOUT AND WITH CONTRAST TECHNIQUE: Multiplanar, multiecho pulse sequences of the brain and surrounding structures were obtained without intravenous contrast. Angiographic images of the Circle of Willis were obtained using MRA technique without intravenous contrast. Angiographic images of the neck were obtained using MRA technique without and with intravenous contrast. Carotid stenosis measurements (when applicable) are obtained utilizing  NASCET criteria, using the distal internal carotid diameter as the denominator. CONTRAST:  52mL MULTIHANCE GADOBENATE DIMEGLUMINE 529 MG/ML IV SOLN COMPARISON:  CT HEAD March 28, 2016 at 2117 hours FINDINGS: MRI HEAD FINDINGS- many sequences are moderately motion degraded. BRAIN: No reduced diffusion to suggest acute ischemia. No susceptibility artifact to suggest hemorrhage. Moderate ventriculomegaly on the basis of global parenchymal brain volume loss. Confluent supratentorial white matter FLAIR T2 hyperintensities. Multiple old pontine, cerebellar infarcts. Old bilateral thalamus and bilateral basal ganglia lacunar infarcts. No midline shift, mass effect or masses. No abnormal extra-axial fluid collections. VASCULAR: Normal major intracranial vascular flow voids present at skull base. SKULL AND UPPER CERVICAL SPINE: No abnormal sellar expansion. No suspicious calvarial bone marrow signal. Craniocervical junction maintained. SINUSES/ORBITS: The mastoid air-cells and included paranasal sinuses are well-aerated. Status post bilateral ocular lens implants. The included ocular globes and orbital contents are non-suspicious. OTHER: None. MRA HEAD FINDINGS- moderately motion degraded examination. ANTERIOR CIRCULATION: Flow related enhancement within bilateral internal carotid arteries. However, is at least moderate stenosis bilateral petrous segments. Severe stenosis RIGHT greater than LEFT carotid siphons. Bilateral anterior middle cerebral arteries are patent, limited assessment due to motion. Supernumerary anterior cerebral artery. POSTERIOR CIRCULATION: No demonstrated RIGHT vertebral artery. LEFT vertebral artery is widely patent. Flow related enhancement bilateral posterior inferior cerebellar arteries. Moderate stenosis proximal basilar artery. Robust LEFT posterior communicating artery present. Moderate tandem stenoses RIGHT posterior cerebral artery. Poor, thready flow related enhancement LEFT  posterior-inferior cerebellar artery with LEFT P1 segment severe stenosis. ANATOMIC VARIANTS: None. MRA NECK FINDINGS- mild motion degraded examination. ANTERIOR CIRCULATION: Mild stenosis LEFT Common carotid artery origin. Bilateral internal carotid arteries are widely patent. Mild stenosis LEFT mid cervical internal carotid artery compatible with atherosclerosis. POSTERIOR CIRCULATION: RIGHT vertebral artery is occluded from the origin without significant reconstitution. Moderate luminal irregularity LEFT vertebral artery including the origin. Source images and MIP image were reviewed. IMPRESSION: MRI HEAD: No acute intracranial process on this motion degraded examination. 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 Moderate to severe chronic small vessel ischemic disease. Moderate global parenchymal brain volume loss, advanced for age. MRA HEAD: Motion degraded examination. RIGHT vertebral artery occlusion, likely old. Severe stenoses bilateral internal carotid arteries due to atherosclerosis. Multifocal moderate stenoses posterior circulation compatible with atherosclerosis. MRA NECK: Occluded RIGHT vertebral artery without  significant reconstitution. Moderate atherosclerosis LEFT vertebral artery. No hemodynamically significant stenosis of the carotid arteries. Electronically Signed   By: Elon Alas M.D.   On: 03/29/2016 00:04   Mr Angiogram Neck W Or Wo Contrast  Result Date: 03/29/2016 CLINICAL DATA:  RIGHT hand numbness beginning at 7 p.m., progressive RIGHT leg numbness. History of hypertension, diabetes. EXAM: MRI HEAD WITHOUT CONTRAST MRA HEAD WITHOUT CONTRAST MRA NECK WITHOUT AND WITH CONTRAST TECHNIQUE: Multiplanar, multiecho pulse sequences of the brain and surrounding structures were obtained without intravenous contrast. Angiographic images of the Circle of Willis were obtained using MRA technique without intravenous contrast. Angiographic images of the neck were obtained  using MRA technique without and with intravenous contrast. Carotid stenosis measurements (when applicable) are obtained utilizing NASCET criteria, using the distal internal carotid diameter as the denominator. CONTRAST:  33mL MULTIHANCE GADOBENATE DIMEGLUMINE 529 MG/ML IV SOLN COMPARISON:  CT HEAD March 28, 2016 at 2117 hours FINDINGS: MRI HEAD FINDINGS- many sequences are moderately motion degraded. BRAIN: No reduced diffusion to suggest acute ischemia. No susceptibility artifact to suggest hemorrhage. Moderate ventriculomegaly on the basis of global parenchymal brain volume loss. Confluent supratentorial white matter FLAIR T2 hyperintensities. Multiple old pontine, cerebellar infarcts. Old bilateral thalamus and bilateral basal ganglia lacunar infarcts. No midline shift, mass effect or masses. No abnormal extra-axial fluid collections. VASCULAR: Normal major intracranial vascular flow voids present at skull base. SKULL AND UPPER CERVICAL SPINE: No abnormal sellar expansion. No suspicious calvarial bone marrow signal. Craniocervical junction maintained. SINUSES/ORBITS: The mastoid air-cells and included paranasal sinuses are well-aerated. Status post bilateral ocular lens implants. The included ocular globes and orbital contents are non-suspicious. OTHER: None. MRA HEAD FINDINGS- moderately motion degraded examination. ANTERIOR CIRCULATION: Flow related enhancement within bilateral internal carotid arteries. However, is at least moderate stenosis bilateral petrous segments. Severe stenosis RIGHT greater than LEFT carotid siphons. Bilateral anterior middle cerebral arteries are patent, limited assessment due to motion. Supernumerary anterior cerebral artery. POSTERIOR CIRCULATION: No demonstrated RIGHT vertebral artery. LEFT vertebral artery is widely patent. Flow related enhancement bilateral posterior inferior cerebellar arteries. Moderate stenosis proximal basilar artery. Robust LEFT posterior communicating  artery present. Moderate tandem stenoses RIGHT posterior cerebral artery. Poor, thready flow related enhancement LEFT posterior-inferior cerebellar artery with LEFT P1 segment severe stenosis. ANATOMIC VARIANTS: None. MRA NECK FINDINGS- mild motion degraded examination. ANTERIOR CIRCULATION: Mild stenosis LEFT Common carotid artery origin. Bilateral internal carotid arteries are widely patent. Mild stenosis LEFT mid cervical internal carotid artery compatible with atherosclerosis. POSTERIOR CIRCULATION: RIGHT vertebral artery is occluded from the origin without significant reconstitution. Moderate luminal irregularity LEFT vertebral artery including the origin. Source images and MIP image were reviewed. IMPRESSION: MRI HEAD: No acute intracranial process on this motion degraded examination. 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 Moderate to severe chronic small vessel ischemic disease. Moderate global parenchymal brain volume loss, advanced for age. MRA HEAD: Motion degraded examination. RIGHT vertebral artery occlusion, likely old. Severe stenoses bilateral internal carotid arteries due to atherosclerosis. Multifocal moderate stenoses posterior circulation compatible with atherosclerosis. MRA NECK: Occluded RIGHT vertebral artery without significant reconstitution. Moderate atherosclerosis LEFT vertebral artery. No hemodynamically significant stenosis of the carotid arteries. Electronically Signed   By: Elon Alas M.D.   On: 03/29/2016 00:04   Mr Brain Wo Contrast  Result Date: 03/29/2016 CLINICAL DATA:  RIGHT hand numbness beginning at 7 p.m., progressive RIGHT leg numbness. History of hypertension, diabetes. EXAM: MRI HEAD WITHOUT CONTRAST MRA HEAD WITHOUT CONTRAST MRA NECK WITHOUT AND WITH CONTRAST  TECHNIQUE: Multiplanar, multiecho pulse sequences of the brain and surrounding structures were obtained without intravenous contrast. Angiographic images of the Circle of Willis were  obtained using MRA technique without intravenous contrast. Angiographic images of the neck were obtained using MRA technique without and with intravenous contrast. Carotid stenosis measurements (when applicable) are obtained utilizing NASCET criteria, using the distal internal carotid diameter as the denominator. CONTRAST:  34mL MULTIHANCE GADOBENATE DIMEGLUMINE 529 MG/ML IV SOLN COMPARISON:  CT HEAD March 28, 2016 at 2117 hours FINDINGS: MRI HEAD FINDINGS- many sequences are moderately motion degraded. BRAIN: No reduced diffusion to suggest acute ischemia. No susceptibility artifact to suggest hemorrhage. Moderate ventriculomegaly on the basis of global parenchymal brain volume loss. Confluent supratentorial white matter FLAIR T2 hyperintensities. Multiple old pontine, cerebellar infarcts. Old bilateral thalamus and bilateral basal ganglia lacunar infarcts. No midline shift, mass effect or masses. No abnormal extra-axial fluid collections. VASCULAR: Normal major intracranial vascular flow voids present at skull base. SKULL AND UPPER CERVICAL SPINE: No abnormal sellar expansion. No suspicious calvarial bone marrow signal. Craniocervical junction maintained. SINUSES/ORBITS: The mastoid air-cells and included paranasal sinuses are well-aerated. Status post bilateral ocular lens implants. The included ocular globes and orbital contents are non-suspicious. OTHER: None. MRA HEAD FINDINGS- moderately motion degraded examination. ANTERIOR CIRCULATION: Flow related enhancement within bilateral internal carotid arteries. However, is at least moderate stenosis bilateral petrous segments. Severe stenosis RIGHT greater than LEFT carotid siphons. Bilateral anterior middle cerebral arteries are patent, limited assessment due to motion. Supernumerary anterior cerebral artery. POSTERIOR CIRCULATION: No demonstrated RIGHT vertebral artery. LEFT vertebral artery is widely patent. Flow related enhancement bilateral posterior  inferior cerebellar arteries. Moderate stenosis proximal basilar artery. Robust LEFT posterior communicating artery present. Moderate tandem stenoses RIGHT posterior cerebral artery. Poor, thready flow related enhancement LEFT posterior-inferior cerebellar artery with LEFT P1 segment severe stenosis. ANATOMIC VARIANTS: None. MRA NECK FINDINGS- mild motion degraded examination. ANTERIOR CIRCULATION: Mild stenosis LEFT Common carotid artery origin. Bilateral internal carotid arteries are widely patent. Mild stenosis LEFT mid cervical internal carotid artery compatible with atherosclerosis. POSTERIOR CIRCULATION: RIGHT vertebral artery is occluded from the origin without significant reconstitution. Moderate luminal irregularity LEFT vertebral artery including the origin. Source images and MIP image were reviewed. IMPRESSION: MRI HEAD: No acute intracranial process on this motion degraded examination. 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 Moderate to severe chronic small vessel ischemic disease. Moderate global parenchymal brain volume loss, advanced for age. MRA HEAD: Motion degraded examination. RIGHT vertebral artery occlusion, likely old. Severe stenoses bilateral internal carotid arteries due to atherosclerosis. Multifocal moderate stenoses posterior circulation compatible with atherosclerosis. MRA NECK: Occluded RIGHT vertebral artery without significant reconstitution. Moderate atherosclerosis LEFT vertebral artery. No hemodynamically significant stenosis of the carotid arteries. Electronically Signed   By: Elon Alas M.D.   On: 03/29/2016 00:04   Ct Head Code Stroke W/o Cm  Result Date: 03/28/2016 CLINICAL DATA:  Code stroke.  Right-sided numbness. EXAM: CT HEAD WITHOUT CONTRAST TECHNIQUE: Contiguous axial images were obtained from the base of the skull through the vertex without intravenous contrast. COMPARISON:  09/14/2010 CT head. FINDINGS: Brain: No evidence of acute  infarction, hemorrhage, hydrocephalus, extra-axial collection or mass lesion/mass effect. Nonspecific foci of hypoattenuation in subcortical and periventricular white matter are stable and compatible with moderate chronic microvascular ischemic changes. Mild brain parenchymal volume loss. Chronic left pontine lacunar infarct. Small hypodense focus in left caudate head and left lentiform nucleus probably represent chronic lacunar infarcts. Stable mild brain parenchymal volume loss. Vascular: Calcific  atherosclerosis of skullbase internal carotid artery is and vertebral arteries. No hyperdense vessel. Skull: Normal. Negative for fracture or focal lesion. Sinuses/Orbits: No acute finding. Other: None. ASPECTS [facility] Stroke Program Early CT Score) - Ganglionic level infarction (caudate, lentiform nuclei, internal capsule, insula, M1-M3 cortex): 7 - Supraganglionic infarction (M4-M6 cortex): 3 Total score (0-10 with 10 being normal): 10 IMPRESSION: 1. No acute intracranial abnormality identified. 2. ASPECTS is 10 3. Stable moderate chronic microvascular ischemic changes of the brain, chronic left basal ganglia and pontine lacunar infarcts, and mild brain parenchymal volume loss. These results were called by telephone at the time of interpretation on 03/28/2016 at 9:38 pm to Dr. Brantley Stage , who verbally acknowledged these results. Electronically Signed   By: Kristine Garbe M.D.   On: 03/28/2016 21:40    Procedures Procedures (including critical care time) CRITICAL CARE Performed by: Forde Dandy   Total critical care time: 35 minutes  Critical care time was exclusive of separately billable procedures and treating other patients.  Critical care was necessary to treat or prevent imminent or life-threatening deterioration.  Critical care was time spent personally by me on the following activities: development of treatment plan with patient and/or surrogate as well as nursing, discussions with  consultants, evaluation of patient's response to treatment, examination of patient, obtaining history from patient or surrogate, ordering and performing treatments and interventions, ordering and review of laboratory studies, ordering and review of radiographic studies, pulse oximetry and re-evaluation of patient's condition.  Medications Ordered in ED Medications  gadobenate dimeglumine (MULTIHANCE) injection 20 mL (20 mLs Intravenous Contrast Given 03/28/16 2330)     Initial Impression / Assessment and Plan / ED Course  I have reviewed the triage vital signs and the nursing notes.  Pertinent labs & imaging results that were available during my care of the patient were reviewed by me and considered in my medical decision making (see chart for details).     Initially code stroke activated due to right-sided numbness within the window for TPA. However, aside from right-sided numbness and remainder of neurological and exam intact  And NIHSS of 1. Discussed with Dr. Cheral Marker. Not felt to be tpa candidate. CT head w/o ICH. MRI/MRA head/neck obtained per Dr. Cheral Marker. To admit patient to [facility] for TIA/CVA work-up. Discussed with Dr. Hal Hope. Will admit to [facility].    Final Clinical Impressions(s) / ED Diagnoses   Final diagnoses:  Numbness on right side  Transient cerebral ischemia, unspecified type    New Prescriptions New Prescriptions   No medications on file     Forde Dandy, MD 03/29/16 [id number]    Forde Dandy, MD 03/29/16 [phone]

## 2016-03-29 ENCOUNTER — Encounter (HOSPITAL_COMMUNITY): Payer: Self-pay | Admitting: Internal Medicine

## 2016-03-29 ENCOUNTER — Observation Stay (HOSPITAL_COMMUNITY): Payer: Medicare Other

## 2016-03-29 ENCOUNTER — Observation Stay (HOSPITAL_BASED_OUTPATIENT_CLINIC_OR_DEPARTMENT_OTHER): Payer: Medicare Other

## 2016-03-29 DIAGNOSIS — R200 Anesthesia of skin: Secondary | ICD-10-CM | POA: Diagnosis present

## 2016-03-29 DIAGNOSIS — G458 Other transient cerebral ischemic attacks and related syndromes: Secondary | ICD-10-CM | POA: Diagnosis not present

## 2016-03-29 DIAGNOSIS — G459 Transient cerebral ischemic attack, unspecified: Secondary | ICD-10-CM | POA: Insufficient documentation

## 2016-03-29 DIAGNOSIS — I160 Hypertensive urgency: Secondary | ICD-10-CM

## 2016-03-29 DIAGNOSIS — I509 Heart failure, unspecified: Secondary | ICD-10-CM | POA: Diagnosis not present

## 2016-03-29 DIAGNOSIS — M4802 Spinal stenosis, cervical region: Secondary | ICD-10-CM | POA: Diagnosis not present

## 2016-03-29 DIAGNOSIS — I429 Cardiomyopathy, unspecified: Secondary | ICD-10-CM | POA: Diagnosis not present

## 2016-03-29 LAB — URINALYSIS, ROUTINE W REFLEX MICROSCOPIC
BACTERIA UA: NONE SEEN
BILIRUBIN URINE: NEGATIVE
GLUCOSE, UA: 50 mg/dL — AB
Ketones, ur: NEGATIVE mg/dL
LEUKOCYTES UA: NEGATIVE
NITRITE: NEGATIVE
PROTEIN: 30 mg/dL — AB
SPECIFIC GRAVITY, URINE: 1.021 (ref 1.005–1.030)
pH: 5 (ref 5.0–8.0)

## 2016-03-29 LAB — CBG MONITORING, ED
GLUCOSE-CAPILLARY: 85 mg/dL (ref 65–99)
GLUCOSE-CAPILLARY: 200 mg/dL — AB (ref 65–99)
Glucose-Capillary: 194 mg/dL — ABNORMAL HIGH (ref 65–99)

## 2016-03-29 LAB — ECHOCARDIOGRAM COMPLETE
Height: 69 in
WEIGHTICAEL: 3920 [oz_av]

## 2016-03-29 LAB — LIPID PANEL
CHOL/HDL RATIO: 6.5 ratio
CHOLESTEROL: 235 mg/dL — AB (ref 0–200)
HDL: 36 mg/dL — ABNORMAL LOW (ref >40)
LDL Cholesterol: 167 mg/dL — ABNORMAL HIGH (ref 0–99)
Triglycerides: 160 mg/dL — ABNORMAL HIGH (ref <150)
VLDL: 32 mg/dL (ref 0–40)

## 2016-03-29 LAB — RAPID URINE DRUG SCREEN, HOSP PERFORMED
Amphetamines: NOT DETECTED
Barbiturates: NOT DETECTED
Benzodiazepines: NOT DETECTED
COCAINE: NOT DETECTED
Opiates: NOT DETECTED
TETRAHYDROCANNABINOL: NOT DETECTED

## 2016-03-29 LAB — GLUCOSE, CAPILLARY
GLUCOSE-CAPILLARY: 271 mg/dL — AB (ref 65–99)
Glucose-Capillary: 232 mg/dL — ABNORMAL HIGH (ref 65–99)

## 2016-03-29 LAB — TROPONIN I: Troponin I: 0.14 ng/mL (ref <0.03)

## 2016-03-29 MED ORDER — ACETAMINOPHEN 650 MG RE SUPP: 650.0000 mg | RECTAL | Status: DC | PRN

## 2016-03-29 MED ORDER — ACETAMINOPHEN 160 MG/5ML PO SOLN: 650.0000 mg | ORAL | Status: DC | PRN

## 2016-03-29 MED ORDER — ISOSORBIDE MONONITRATE ER 60 MG PO TB24
60.0000 mg | ORAL_TABLET | Freq: Every day | ORAL | Status: DC
  Administered 2016-03-29 – 2016-03-31 (×3): 60 mg via ORAL
  Filled 2016-03-29 (×3): qty 1

## 2016-03-29 MED ORDER — LOSARTAN POTASSIUM-HCTZ 100-12.5 MG PO TABS: 1.0000 | ORAL_TABLET | Freq: Every day | ORAL | Status: DC

## 2016-03-29 MED ORDER — CLONIDINE HCL 0.1 MG PO TABS
0.2000 mg | ORAL_TABLET | Freq: Three times a day (TID) | ORAL | Status: DC
  Administered 2016-03-29 – 2016-04-02 (×10): 0.2 mg via ORAL
  Filled 2016-03-29 (×12): qty 2

## 2016-03-29 MED ORDER — INSULIN ASPART PROT & ASPART (70-30 MIX) 100 UNIT/ML ~~LOC~~ SUSP
52.0000 [IU] | Freq: Every day | SUBCUTANEOUS | Status: DC
  Administered 2016-03-30: 52 [IU] via SUBCUTANEOUS
  Filled 2016-03-29: qty 10

## 2016-03-29 MED ORDER — BISMUTH SUBSALICYLATE 262 MG/15ML PO SUSP
30.0000 mL | ORAL | Status: DC | PRN
  Administered 2016-03-29: 30 mL via ORAL
  Filled 2016-03-29: qty 118

## 2016-03-29 MED ORDER — ACETAMINOPHEN 325 MG PO TABS
650.0000 mg | ORAL_TABLET | ORAL | Status: DC | PRN
  Administered 2016-03-30 (×2): 650 mg via ORAL
  Filled 2016-03-29 (×2): qty 2

## 2016-03-29 MED ORDER — METOPROLOL TARTRATE 25 MG PO TABS
25.0000 mg | ORAL_TABLET | Freq: Two times a day (BID) | ORAL | Status: DC
  Administered 2016-03-29: 25 mg via ORAL
  Filled 2016-03-29: qty 1

## 2016-03-29 MED ORDER — INSULIN ASPART PROT & ASPART (70-30 MIX) 100 UNIT/ML ~~LOC~~ SUSP: 32.0000 [IU] | Freq: Every day | SUBCUTANEOUS | Status: DC

## 2016-03-29 MED ORDER — INSULIN ASPART PROT & ASPART (70-30 MIX) 100 UNIT/ML ~~LOC~~ SUSP
10.0000 [IU] | Freq: Once | SUBCUTANEOUS | Status: AC
  Administered 2016-03-29: 10 [IU] via SUBCUTANEOUS
  Filled 2016-03-29 (×2): qty 10

## 2016-03-29 MED ORDER — FUROSEMIDE 80 MG PO TABS
80.0000 mg | ORAL_TABLET | Freq: Every day | ORAL | Status: DC
  Administered 2016-03-29 – 2016-04-03 (×6): 80 mg via ORAL
  Filled 2016-03-29: qty 2
  Filled 2016-03-29 (×5): qty 1

## 2016-03-29 MED ORDER — HYDROCHLOROTHIAZIDE 12.5 MG PO CAPS
12.5000 mg | ORAL_CAPSULE | Freq: Every day | ORAL | Status: DC
  Administered 2016-03-29: 12.5 mg via ORAL
  Filled 2016-03-29: qty 1

## 2016-03-29 MED ORDER — HYDRALAZINE HCL 20 MG/ML IJ SOLN
10.0000 mg | Freq: Once | INTRAMUSCULAR | Status: AC
  Administered 2016-03-29: 10 mg via INTRAVENOUS
  Filled 2016-03-29: qty 1

## 2016-03-29 MED ORDER — RIVAROXABAN 20 MG PO TABS
20.0000 mg | ORAL_TABLET | Freq: Every day | ORAL | Status: DC
  Administered 2016-03-30 – 2016-04-02 (×4): 20 mg via ORAL
  Filled 2016-03-29 (×4): qty 1

## 2016-03-29 MED ORDER — RIVAROXABAN 20 MG PO TABS
20.0000 mg | ORAL_TABLET | Freq: Every day | ORAL | Status: DC
  Administered 2016-03-29: 20 mg via ORAL
  Filled 2016-03-29: qty 1

## 2016-03-29 MED ORDER — CHLORTHALIDONE 25 MG PO TABS
25.0000 mg | ORAL_TABLET | Freq: Every day | ORAL | Status: DC
  Administered 2016-03-30 – 2016-04-03 (×5): 25 mg via ORAL
  Filled 2016-03-29 (×5): qty 1

## 2016-03-29 MED ORDER — INSULIN ASPART PROT & ASPART (70-30 MIX) 100 UNIT/ML ~~LOC~~ SUSP
32.0000 [IU] | Freq: Every day | SUBCUTANEOUS | Status: DC
  Filled 2016-03-29: qty 10

## 2016-03-29 MED ORDER — HYDRALAZINE HCL 50 MG PO TABS
50.0000 mg | ORAL_TABLET | Freq: Four times a day (QID) | ORAL | Status: DC
  Administered 2016-03-30 – 2016-03-31 (×5): 50 mg via ORAL
  Filled 2016-03-29 (×6): qty 1

## 2016-03-29 MED ORDER — LOSARTAN POTASSIUM 50 MG PO TABS
100.0000 mg | ORAL_TABLET | Freq: Every day | ORAL | Status: DC
  Administered 2016-03-29 – 2016-04-03 (×6): 100 mg via ORAL
  Filled 2016-03-29 (×7): qty 2

## 2016-03-29 MED ORDER — STROKE: EARLY STAGES OF RECOVERY BOOK
Freq: Once | Status: DC
  Filled 2016-03-29: qty 1

## 2016-03-29 MED ORDER — CARVEDILOL 12.5 MG PO TABS
25.0000 mg | ORAL_TABLET | Freq: Two times a day (BID) | ORAL | Status: DC
  Administered 2016-03-30 – 2016-04-03 (×9): 25 mg via ORAL
  Filled 2016-03-29 (×9): qty 2

## 2016-03-29 MED ORDER — INSULIN ASPART 100 UNIT/ML ~~LOC~~ SOLN
0.0000 [IU] | Freq: Three times a day (TID) | SUBCUTANEOUS | Status: DC
  Administered 2016-03-30: 3 [IU] via SUBCUTANEOUS
  Administered 2016-03-30: 2 [IU] via SUBCUTANEOUS
  Administered 2016-03-31: 1 [IU] via SUBCUTANEOUS
  Administered 2016-03-31: 5 [IU] via SUBCUTANEOUS
  Administered 2016-03-31: 3 [IU] via SUBCUTANEOUS
  Administered 2016-04-01: 7 [IU] via SUBCUTANEOUS
  Administered 2016-04-01: 5 [IU] via SUBCUTANEOUS
  Administered 2016-04-01 – 2016-04-02 (×2): 3 [IU] via SUBCUTANEOUS
  Administered 2016-04-02 (×2): 2 [IU] via SUBCUTANEOUS
  Administered 2016-04-03: 1 [IU] via SUBCUTANEOUS
  Administered 2016-04-03: 2 [IU] via SUBCUTANEOUS

## 2016-03-29 NOTE — Progress Notes (Signed)
Subjective: Patient admitted this morning, see detailed H&P by Dr Hal Hope 61 y.o. male with history of diabetes mellitus type 2, hypertension, CHF, DVT presents to ER because of increasing numbness of the right upper and lower extremity. Patient's symptoms started around 7 PM last evening. Denies any associated weakness of extremities or any difficulty swallowing or speaking or visual symptoms. CT of the head was unremarkable. On-call neurologist Dr. Cheral Marker was consulted and requested patient be transferred to [facility]. MRI of the brain and MRA of the head and neck was showing no acute infarct but did show occlusion of the right vertebral artery. On my exam patient still complains of right upper and lower extremity numbness but no obvious weakness. Patient's blood pressure is also markedly elevated on presentation which improved without any intervention.   Patient has history of DVT and has missed his Xarelto dose last 3 days as he ran out of the medication.   This morning patient continues to have right sided numbness, right arm worse than right leg. Blood pressure is elevated, patient on multiple antihypertensive medications at home. MRI brain is negative for stroke  Vitals:   03/29/16 1500 03/29/16 1530  BP: (!) 204/86 188/89  Pulse: 64 (!) 58  Resp: 13 13  Temp:      On exam- decreased sensation in right arm as compared to left arm  A/P Right sided numbness Hypertensive urgency Elevated troponin History of DVT Diabetes mellitus History of diastolic heart failure  MRI brain showed no acute stroke. MRA of head and neck showed right vertebral artery occlusion. Neurology has been consulted by ED physician. Patient to be transferred to [facility]. MRI cervical spine pending  Patient blood pressure is elevated, will restart home medications including Catapres 0.2 mg 3 times a day, HCTZ 12.5 mg by mouth daily, Cozaar 100 mg by mouth daily, metoprolol 25 mg by  mouth twice a day  Elevated troponin- troponin is elevated to 0.14, likely from demand ischemia from hypertensive urgency. Cycle troponin every 6 hours 3, EKG showed repolarization changes in lateral leads. Will follow  Continue Xarelto     Oswald Hillock [facility] [phone]

## 2016-03-29 NOTE — Progress Notes (Signed)
PT Note  Patient Details Name: [name] MRN: :[id number] DOB: 1955-08-30     Received order for PT consult. Noted pt with very high BP, will hold on activity at this time. Will check back. I you need PT before we return don't hesitate to page me. Thanks  Clide Dales 03/29/2016, 10:08 AM  Clide Dales, PT Pager: [phone] 03/29/2016

## 2016-03-29 NOTE — ED Notes (Signed)
[facility] called for transport. 

## 2016-03-29 NOTE — Progress Notes (Signed)
  Echocardiogram 2D Echocardiogram has been performed.  Darlina Sicilian M 03/29/2016, 2:29 PM

## 2016-03-29 NOTE — ED Notes (Signed)
Dr. Darrick Meigs has reassessed the pt and is okay with the patient having his cervical spine MRI tomorrow am.

## 2016-03-29 NOTE — Consult Note (Signed)
Cardiology Consult    Patient ID: [name] MRN: [id number], DOB/AGE: 61/15/1957   Admit date: 03/28/2016 Date of Consult: 03/29/2016  Primary Physician: [name], MD Reason for Consult: Elevated troponin  Primary Cardiologist: New Requesting Provider: Dr. Darrick Meigs  Patient Profile   Mr. [name] is a 61 year old male with a past medical history of chronic systolic CHF, ischemia cardiomyopathy,DM, DVT (on Xarelto), HTN, and HLD. He was admitted numbness of the right upper and lower extremity, found to have a right vertebral artery occlusion, also with elevated troponin and cardiology was consulted.   History of Present Illness    Mr. Vandriel was eating dinner at 7pm on the night of 03/28/16 and noticed right hand numbness and right leg numbness. Code stroke was initiated on arrival to the [facility] ED, he was felt not to be on a TPA candidate. Of note, he had not taken his Xarelto as he had run out of medication 3 days prior.   His BP on arrival to the ED was 204/86. EKG showed NSR with t wave inversion and ST depression in the inferolateral leads. Troponin is 0.14. Echo shows EF of 25-30% with akinesis of the inferolateral myocardium. Last Echo was in 2014, which showed normal EF.   He tells me that he does chair exercises every morning, does 160 "jumping jacks" with his arms. He denies chest pain with this activity.   He does not smoke, denies ETOH use and denies a family history of heart disease.   Past Medical History   Past Medical History:  Diagnosis Date  . Adrenal insufficiency ([place])   . Allergy   . Arthritis   . Blind left eye   . Cardiomyopathy ([place])   . Carpal tunnel syndrome   . Cataract    bil removed  . CHF (congestive heart failure) ([place])   . Diabetes mellitus without complication ([place])   . ED (erectile dysfunction)   . Gout   . Gout   . Herpes   . Hypercholesterolemia   . Hypertension   . MI (myocardial infarction)   . Peripheral neuropathy  ([place])   . Pneumonia   . PVD (peripheral vascular disease) ([place])     Past Surgical History:  Procedure Laterality Date  . CHOLECYSTECTOMY    . dilatera cataracts removed    . right knee surgary    . stents       Allergies  Allergies  Allergen Reactions  . Iohexol     hives  . Ivp Dye [Iodinated Diagnostic Agents]     hives    Inpatient Medications    .  stroke: mapping our early stages of recovery book   Does not apply Once  . cloNIDine  0.2 mg Oral TID  . furosemide  80 mg Oral Daily  . hydrochlorothiazide  12.5 mg Oral Daily  . insulin aspart  0-9 Units Subcutaneous TID WC  . insulin aspart protamine- aspart  32 Units Subcutaneous Q supper  . insulin aspart protamine- aspart  52 Units Subcutaneous Q breakfast  . losartan  100 mg Oral Daily  . metoprolol tartrate  25 mg Oral BID  . [START ON 03/30/2016] rivaroxaban  20 mg Oral Q supper    Family History    Family History  Problem Relation Age of Onset  . Breast cancer Mother   . Hypertension Father     blood clot in leg  . Diabetes Maternal Aunt   . Heart attack Sister   .  Colon cancer Neg Hx   . Esophageal cancer Neg Hx   . Rectal cancer Neg Hx   . Stomach cancer Neg Hx     Social History    Social History   Social History  . Marital status: Married    Spouse name: N/A  . Number of children: N/A  . Years of education: N/A   Occupational History  . Not on file.   Social History Main Topics  . Smoking status: Never Smoker  . Smokeless tobacco: Never Used  . Alcohol use No  . Drug use: No  . Sexual activity: No   Other Topics Concern  . Not on file   Social History Narrative  . No narrative on file     Review of Systems    General:  No chills, fever, night sweats or weight changes.  Cardiovascular:  No chest pain, dyspnea on exertion, edema, orthopnea, palpitations, paroxysmal nocturnal dyspnea. Dermatological: No rash, lesions/masses Respiratory: No cough, dyspnea Urologic: No  hematuria, dysuria Abdominal:   No nausea, vomiting, diarrhea, bright red blood per rectum, melena, or hematemesis Neurologic:  No visual changes, wkns, changes in mental status. All other systems reviewed and are otherwise negative except as noted above.  Physical Exam    Blood pressure (!) 219/86, pulse 64, temperature 98.4 F (36.9 C), temperature source Oral, resp. rate 16, height 5\' 9"  (1.753 m), weight 245 lb (111.1 kg), SpO2 100 %.  General: Pleasant, NAD Psych: Normal affect. Neuro: Alert and oriented X 3. Moves all extremities spontaneously. Has some right sided facial tingling.  HEENT: Normal  Neck: Supple without bruits or JVD. Lungs:  Resp regular and unlabored, CTA. Heart: RRR no s3, s4, or murmurs. Abdomen: Soft, non-tender, non-distended, BS + x 4.  Extremities: No clubbing, cyanosis or edema. DP/PT/Radials 2+ and equal bilaterally.  Labs    Troponin [facility] of Care Test)  Recent Labs  03/28/16 2119  TROPIPOC 0.07    Recent Labs  03/29/16 0931  TROPONINI 0.14*   Lab Results  Component Value Date   WBC 5.9 03/28/2016   HGB 15.3 03/28/2016   HCT 45.0 03/28/2016   MCV 85.6 03/28/2016   PLT 210 03/28/2016    Recent Labs Lab 03/28/16 2113 03/28/16 2121  NA 139 143  K 3.3* 3.4*  CL 103 102  CO2 26  --   BUN 18 18  CREATININE 1.18 1.30*  CALCIUM 8.9  --   PROT 7.7  --   BILITOT 0.6  --   ALKPHOS 61  --   ALT 17  --   AST 24  --   GLUCOSE 165* 164*   Lab Results  Component Value Date   CHOL 235 (H) 03/29/2016   HDL 36 (L) 03/29/2016   LDLCALC 167 (H) 03/29/2016   TRIG 160 (H) 03/29/2016   No results found for: [facility]   Radiology Studies    Mr Jodene Nam Head Wo Contrast  Result Date: 03/29/2016 CLINICAL DATA:  RIGHT hand numbness beginning at 7 p.m., progressive RIGHT leg numbness. History of hypertension, diabetes. EXAM: MRI HEAD WITHOUT CONTRAST MRA HEAD WITHOUT CONTRAST MRA NECK WITHOUT AND WITH CONTRAST TECHNIQUE: Multiplanar, multiecho pulse  sequences of the brain and surrounding structures were obtained without intravenous contrast. Angiographic images of the Circle of Willis were obtained using MRA technique without intravenous contrast. Angiographic images of the neck were obtained using MRA technique without and with intravenous contrast. Carotid stenosis measurements (when applicable) are obtained utilizing NASCET criteria, using the  distal internal carotid diameter as the denominator. CONTRAST:  45mL MULTIHANCE GADOBENATE DIMEGLUMINE 529 MG/ML IV SOLN COMPARISON:  CT HEAD March 28, 2016 at 2117 hours FINDINGS: MRI HEAD FINDINGS- many sequences are moderately motion degraded. BRAIN: No reduced diffusion to suggest acute ischemia. No susceptibility artifact to suggest hemorrhage. Moderate ventriculomegaly on the basis of global parenchymal brain volume loss. Confluent supratentorial white matter FLAIR T2 hyperintensities. Multiple old pontine, cerebellar infarcts. Old bilateral thalamus and bilateral basal ganglia lacunar infarcts. No midline shift, mass effect or masses. No abnormal extra-axial fluid collections. VASCULAR: Normal major intracranial vascular flow voids present at skull base. SKULL AND UPPER CERVICAL SPINE: No abnormal sellar expansion. No suspicious calvarial bone marrow signal. Craniocervical junction maintained. SINUSES/ORBITS: The mastoid air-cells and included paranasal sinuses are well-aerated. Status post bilateral ocular lens implants. The included ocular globes and orbital contents are non-suspicious. OTHER: None. MRA HEAD FINDINGS- moderately motion degraded examination. ANTERIOR CIRCULATION: Flow related enhancement within bilateral internal carotid arteries. However, is at least moderate stenosis bilateral petrous segments. Severe stenosis RIGHT greater than LEFT carotid siphons. Bilateral anterior middle cerebral arteries are patent, limited assessment due to motion. Supernumerary anterior cerebral artery. POSTERIOR  CIRCULATION: No demonstrated RIGHT vertebral artery. LEFT vertebral artery is widely patent. Flow related enhancement bilateral posterior inferior cerebellar arteries. Moderate stenosis proximal basilar artery. Robust LEFT posterior communicating artery present. Moderate tandem stenoses RIGHT posterior cerebral artery. Poor, thready flow related enhancement LEFT posterior-inferior cerebellar artery with LEFT P1 segment severe stenosis. ANATOMIC VARIANTS: None. MRA NECK FINDINGS- mild motion degraded examination. ANTERIOR CIRCULATION: Mild stenosis LEFT Common carotid artery origin. Bilateral internal carotid arteries are widely patent. Mild stenosis LEFT mid cervical internal carotid artery compatible with atherosclerosis. POSTERIOR CIRCULATION: RIGHT vertebral artery is occluded from the origin without significant reconstitution. Moderate luminal irregularity LEFT vertebral artery including the origin. Source images and MIP image were reviewed. IMPRESSION: MRI HEAD: No acute intracranial process on this motion degraded examination. 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 Moderate to severe chronic small vessel ischemic disease. Moderate global parenchymal brain volume loss, advanced for age. MRA HEAD: Motion degraded examination. RIGHT vertebral artery occlusion, likely old. Severe stenoses bilateral internal carotid arteries due to atherosclerosis. Multifocal moderate stenoses posterior circulation compatible with atherosclerosis. MRA NECK: Occluded RIGHT vertebral artery without significant reconstitution. Moderate atherosclerosis LEFT vertebral artery. No hemodynamically significant stenosis of the carotid arteries. Electronically Signed   By: Elon Alas M.D.   On: 03/29/2016 00:04   Mr Angiogram Neck W Or Wo Contrast  Result Date: 03/29/2016 CLINICAL DATA:  RIGHT hand numbness beginning at 7 p.m., progressive RIGHT leg numbness. History of hypertension, diabetes. EXAM: MRI HEAD  WITHOUT CONTRAST MRA HEAD WITHOUT CONTRAST MRA NECK WITHOUT AND WITH CONTRAST TECHNIQUE: Multiplanar, multiecho pulse sequences of the brain and surrounding structures were obtained without intravenous contrast. Angiographic images of the Circle of Willis were obtained using MRA technique without intravenous contrast. Angiographic images of the neck were obtained using MRA technique without and with intravenous contrast. Carotid stenosis measurements (when applicable) are obtained utilizing NASCET criteria, using the distal internal carotid diameter as the denominator. CONTRAST:  58mL MULTIHANCE GADOBENATE DIMEGLUMINE 529 MG/ML IV SOLN COMPARISON:  CT HEAD March 28, 2016 at 2117 hours FINDINGS: MRI HEAD FINDINGS- many sequences are moderately motion degraded. BRAIN: No reduced diffusion to suggest acute ischemia. No susceptibility artifact to suggest hemorrhage. Moderate ventriculomegaly on the basis of global parenchymal brain volume loss. Confluent supratentorial white matter FLAIR T2 hyperintensities. Multiple old pontine,  cerebellar infarcts. Old bilateral thalamus and bilateral basal ganglia lacunar infarcts. No midline shift, mass effect or masses. No abnormal extra-axial fluid collections. VASCULAR: Normal major intracranial vascular flow voids present at skull base. SKULL AND UPPER CERVICAL SPINE: No abnormal sellar expansion. No suspicious calvarial bone marrow signal. Craniocervical junction maintained. SINUSES/ORBITS: The mastoid air-cells and included paranasal sinuses are well-aerated. Status post bilateral ocular lens implants. The included ocular globes and orbital contents are non-suspicious. OTHER: None. MRA HEAD FINDINGS- moderately motion degraded examination. ANTERIOR CIRCULATION: Flow related enhancement within bilateral internal carotid arteries. However, is at least moderate stenosis bilateral petrous segments. Severe stenosis RIGHT greater than LEFT carotid siphons. Bilateral anterior  middle cerebral arteries are patent, limited assessment due to motion. Supernumerary anterior cerebral artery. POSTERIOR CIRCULATION: No demonstrated RIGHT vertebral artery. LEFT vertebral artery is widely patent. Flow related enhancement bilateral posterior inferior cerebellar arteries. Moderate stenosis proximal basilar artery. Robust LEFT posterior communicating artery present. Moderate tandem stenoses RIGHT posterior cerebral artery. Poor, thready flow related enhancement LEFT posterior-inferior cerebellar artery with LEFT P1 segment severe stenosis. ANATOMIC VARIANTS: None. MRA NECK FINDINGS- mild motion degraded examination. ANTERIOR CIRCULATION: Mild stenosis LEFT Common carotid artery origin. Bilateral internal carotid arteries are widely patent. Mild stenosis LEFT mid cervical internal carotid artery compatible with atherosclerosis. POSTERIOR CIRCULATION: RIGHT vertebral artery is occluded from the origin without significant reconstitution. Moderate luminal irregularity LEFT vertebral artery including the origin. Source images and MIP image were reviewed. IMPRESSION: MRI HEAD: No acute intracranial process on this motion degraded examination. 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 Moderate to severe chronic small vessel ischemic disease. Moderate global parenchymal brain volume loss, advanced for age. MRA HEAD: Motion degraded examination. RIGHT vertebral artery occlusion, likely old. Severe stenoses bilateral internal carotid arteries due to atherosclerosis. Multifocal moderate stenoses posterior circulation compatible with atherosclerosis. MRA NECK: Occluded RIGHT vertebral artery without significant reconstitution. Moderate atherosclerosis LEFT vertebral artery. No hemodynamically significant stenosis of the carotid arteries. Electronically Signed   By: Elon Alas M.D.   On: 03/29/2016 00:04   Mr Brain Wo Contrast  Result Date: 03/29/2016 CLINICAL DATA:  RIGHT hand  numbness beginning at 7 p.m., progressive RIGHT leg numbness. History of hypertension, diabetes. EXAM: MRI HEAD WITHOUT CONTRAST MRA HEAD WITHOUT CONTRAST MRA NECK WITHOUT AND WITH CONTRAST TECHNIQUE: Multiplanar, multiecho pulse sequences of the brain and surrounding structures were obtained without intravenous contrast. Angiographic images of the Circle of Willis were obtained using MRA technique without intravenous contrast. Angiographic images of the neck were obtained using MRA technique without and with intravenous contrast. Carotid stenosis measurements (when applicable) are obtained utilizing NASCET criteria, using the distal internal carotid diameter as the denominator. CONTRAST:  62mL MULTIHANCE GADOBENATE DIMEGLUMINE 529 MG/ML IV SOLN COMPARISON:  CT HEAD March 28, 2016 at 2117 hours FINDINGS: MRI HEAD FINDINGS- many sequences are moderately motion degraded. BRAIN: No reduced diffusion to suggest acute ischemia. No susceptibility artifact to suggest hemorrhage. Moderate ventriculomegaly on the basis of global parenchymal brain volume loss. Confluent supratentorial white matter FLAIR T2 hyperintensities. Multiple old pontine, cerebellar infarcts. Old bilateral thalamus and bilateral basal ganglia lacunar infarcts. No midline shift, mass effect or masses. No abnormal extra-axial fluid collections. VASCULAR: Normal major intracranial vascular flow voids present at skull base. SKULL AND UPPER CERVICAL SPINE: No abnormal sellar expansion. No suspicious calvarial bone marrow signal. Craniocervical junction maintained. SINUSES/ORBITS: The mastoid air-cells and included paranasal sinuses are well-aerated. Status post bilateral ocular lens implants. The included ocular globes and orbital contents  are non-suspicious. OTHER: None. MRA HEAD FINDINGS- moderately motion degraded examination. ANTERIOR CIRCULATION: Flow related enhancement within bilateral internal carotid arteries. However, is at least moderate  stenosis bilateral petrous segments. Severe stenosis RIGHT greater than LEFT carotid siphons. Bilateral anterior middle cerebral arteries are patent, limited assessment due to motion. Supernumerary anterior cerebral artery. POSTERIOR CIRCULATION: No demonstrated RIGHT vertebral artery. LEFT vertebral artery is widely patent. Flow related enhancement bilateral posterior inferior cerebellar arteries. Moderate stenosis proximal basilar artery. Robust LEFT posterior communicating artery present. Moderate tandem stenoses RIGHT posterior cerebral artery. Poor, thready flow related enhancement LEFT posterior-inferior cerebellar artery with LEFT P1 segment severe stenosis. ANATOMIC VARIANTS: None. MRA NECK FINDINGS- mild motion degraded examination. ANTERIOR CIRCULATION: Mild stenosis LEFT Common carotid artery origin. Bilateral internal carotid arteries are widely patent. Mild stenosis LEFT mid cervical internal carotid artery compatible with atherosclerosis. POSTERIOR CIRCULATION: RIGHT vertebral artery is occluded from the origin without significant reconstitution. Moderate luminal irregularity LEFT vertebral artery including the origin. Source images and MIP image were reviewed. IMPRESSION: MRI HEAD: No acute intracranial process on this motion degraded examination. 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 Moderate to severe chronic small vessel ischemic disease. Moderate global parenchymal brain volume loss, advanced for age. MRA HEAD: Motion degraded examination. RIGHT vertebral artery occlusion, likely old. Severe stenoses bilateral internal carotid arteries due to atherosclerosis. Multifocal moderate stenoses posterior circulation compatible with atherosclerosis. MRA NECK: Occluded RIGHT vertebral artery without significant reconstitution. Moderate atherosclerosis LEFT vertebral artery. No hemodynamically significant stenosis of the carotid arteries. Electronically Signed   By: Elon Alas  M.D.   On: 03/29/2016 00:04   Dg Chest Port 1 View  Result Date: 03/29/2016 CLINICAL DATA:  Diabetes.  CHF. EXAM: PORTABLE CHEST 1 VIEW COMPARISON:  09/13/2010 . FINDINGS: Cardiomegaly with mild interstitial prominence. Mild component congestive heart failure cannot be excluded. Low lung volumes. Atelectasis. No pleural effusion or pneumothorax. IMPRESSION: 1. Cardiomegaly with very mild interstitial prominence noted bilaterally. Mild CHF cannot be excluded . 2.  Low lung volumes.  Basilar atelectasis. Electronically Signed   By: Marcello Moores  Register   On: 03/29/2016 07:26   Ct Head Code Stroke W/o Cm  Result Date: 03/28/2016 CLINICAL DATA:  Code stroke.  Right-sided numbness. EXAM: CT HEAD WITHOUT CONTRAST TECHNIQUE: Contiguous axial images were obtained from the base of the skull through the vertex without intravenous contrast. COMPARISON:  09/14/2010 CT head. FINDINGS: Brain: No evidence of acute infarction, hemorrhage, hydrocephalus, extra-axial collection or mass lesion/mass effect. Nonspecific foci of hypoattenuation in subcortical and periventricular white matter are stable and compatible with moderate chronic microvascular ischemic changes. Mild brain parenchymal volume loss. Chronic left pontine lacunar infarct. Small hypodense focus in left caudate head and left lentiform nucleus probably represent chronic lacunar infarcts. Stable mild brain parenchymal volume loss. Vascular: Calcific atherosclerosis of skullbase internal carotid artery is and vertebral arteries. No hyperdense vessel. Skull: Normal. Negative for fracture or focal lesion. Sinuses/Orbits: No acute finding. Other: None. ASPECTS [facility] Stroke Program Early CT Score) - Ganglionic level infarction (caudate, lentiform nuclei, internal capsule, insula, M1-M3 cortex): 7 - Supraganglionic infarction (M4-M6 cortex): 3 Total score (0-10 with 10 being normal): 10 IMPRESSION: 1. No acute intracranial abnormality identified. 2. ASPECTS is 10 3.  Stable moderate chronic microvascular ischemic changes of the brain, chronic left basal ganglia and pontine lacunar infarcts, and mild brain parenchymal volume loss. These results were called by telephone at the time of interpretation on 03/28/2016 at 9:38 pm to Dr. Brantley Stage , who verbally  acknowledged these results. Electronically Signed   By: Kristine Garbe M.D.   On: 03/28/2016 21:40    EKG & Cardiac Imaging    EKG: NSR, ST depression and T wave inversion in the inferolateral leads.   Echocardiogram: 03/29/16 Study Conclusions  - Left ventricle: The cavity size was mildly dilated. Wall   thickness was increased in a pattern of mild LVH. Systolic   function was severely reduced. The estimated ejection fraction   was in the range of 25% to 30%. Diffuse hypokinesis. There is   akinesis of the inferolateral myocardium. Doppler parameters are   consistent with abnormal left ventricular relaxation (grade 1   diastolic dysfunction). Doppler parameters are consistent with   high ventricular filling pressure. - Left atrium: The atrium was mildly dilated. - Right ventricle: Systolic function was moderately reduced.  Impressions:  - Global hypokinesis with akinesis of the inferolateral wall;   overall severely reduced LV systolic function; grade 1 diastolic   dysfunction; elevated LV filliing pressure; mild LAE; RV not well   visualized but function appears moderately reduced.   Assessment & Plan    1. Elevated troponin/abnormal EKG: Troponin is elevated at 0.14, will continue to cycle. His EKG shows abnormalities in the inferolateral leads, and his echo shows akinesis in the same territory. He will need an ischemic evaluation, MD to advise on timing.   2. Hypertensive urgency: Still with marked hypertension. Would restart his home hydralazine in addition to his losartan 100mg  daily, catapres 0.2mg  TID, metoprolol 25mg  BID, and hctz 12.5mg  daily.   Would stop hctz and add  chlorthalidone instead.   3. LV dysfunction: Patient appears stable from volume standpoint, will follow along. Continue ARB and beta blocker, will change to Coreg with low EF.   4. HLD: LDL is 167, start high intensity statin.   5. History of DVT: On Xarelto. Continue same.   Signed, Arbutus Leas, NP 03/29/2016, 5:59 PM Pager: [phone]  History and all data above reviewed.  Patient examined.  I agree with the findings as above. The patient denies any recent chest pain or SOB.  He presented with stroke symptoms as above.  However, during his hospitalization he is found to have mildly elevated troponins.  Echo shows a markedly reduced EF.  The patient exam reveals COR:RRR  ,  Lungs: Clear  ,  Abd: Positive bowel sounds, no rebound no guarding, Ext Mild edema with reduced pulses.   .  All available labs, radiology testing, previous records reviewed. Agree with documented assessment and plan.   CM:  Seems to be new.  However, he has no overt symptoms consistent with failure.  He has no active ischemia symptoms.  He does have a history of CAD and I found a cath from 2007 with RCA stent.  He will eventually need an ischemia work up.  However, I do not think that there is urgency to this with his acute neurologic event.  Agree with current meds and in particular titration of med for HTN.  I will add Imdur to his meds.     Marion Seese  7:00 PM  03/29/2016

## 2016-03-29 NOTE — ED Notes (Signed)
[facility] left with patient 

## 2016-03-29 NOTE — ED Notes (Signed)
[facility] arrived  

## 2016-03-29 NOTE — H&P (Signed)
History and Physical    [name] [id number] DOB: May 30, 1955 DOA: 03/28/2016  PCP: [name], MD  Patient coming from: Home.  Chief Complaint: Right upper and lower extremity numbness.  HPI: [name] is a 61 y.o. male with history of diabetes mellitus type 2, hypertension, CHF, DVT presents to ER because of increasing numbness of the right upper and lower extremity. Patient's symptoms started around 7 PM last evening. Denies any associated weakness of extremities or any difficulty swallowing or speaking or visual symptoms. CT of the head was unremarkable. On-call neurologist Dr. Cheral Marker was consulted and requested patient be transferred to [facility]. MRI of the brain and MRA of the head and neck was showing no acute infarct but did show occlusion of the right vertebral artery. On my exam patient still complains of right upper and lower extremity numbness but no obvious weakness. Patient's blood pressure is also markedly elevated on presentation which improved without any intervention.   Patient has history of DVT and has missed his Xarelto dose last 3 days as he ran out of the medication.  ED Course: MRI of the brain did not show anything acute. MRA of the head and neck showed right vertebral artery occlusion. Neurologist has been consulted.   Review of Systems: As per HPI, rest all negative.   Past Medical History:  Diagnosis Date  . Adrenal insufficiency ([place])   . Allergy   . Arthritis   . Blind left eye   . Cardiomyopathy ([place])   . Carpal tunnel syndrome   . Cataract    bil removed  . CHF (congestive heart failure) ([place])   . Diabetes mellitus without complication ([place])   . ED (erectile dysfunction)   . Gout   . Gout   . Herpes   . Hypercholesterolemia   . Hypertension   . MI (myocardial infarction)   . Peripheral neuropathy ([place])   . Pneumonia   . PVD (peripheral vascular disease) ([place])     Past Surgical History:  Procedure  Laterality Date  . CHOLECYSTECTOMY    . dilatera cataracts removed    . right knee surgary    . stents       reports that he has never smoked. He has never used smokeless tobacco. He reports that he does not drink alcohol or use drugs.  Allergies  Allergen Reactions  . Iohexol     hives  . Ivp Dye [Iodinated Diagnostic Agents]     hives    Family History  Problem Relation Age of Onset  . Breast cancer Mother   . Hypertension Father     blood clot in leg  . Diabetes Maternal Aunt   . Heart attack Sister   . Colon cancer Neg Hx   . Esophageal cancer Neg Hx   . Rectal cancer Neg Hx   . Stomach cancer Neg Hx     Prior to Admission medications   Medication Sig Start Date End Date Taking? Authorizing Provider  cloNIDine (CATAPRES) 0.2 MG tablet Take 1 tablet (0.2 mg total) by mouth 3 (three) times daily. 09/10/13  Yes [name], MD  furosemide (LASIX) 80 MG tablet Take 80 mg by mouth daily.    Yes Historical Provider, MD  insulin NPH-regular (NOVOLIN 70/30) (70-30) 100 UNIT/ML injection Inject into the skin. 52 units in the morning and 32 units in the evening   Yes Historical Provider, MD  losartan-hydrochlorothiazide (HYZAAR) 100-12.5 MG per tablet Take 1 tablet by  mouth daily.   Yes Historical Provider, MD  metFORMIN (GLUCOPHAGE) 1000 MG tablet Take 1,000 mg by mouth 2 (two) times daily with a meal.   Yes Historical Provider, MD  metoprolol tartrate (LOPRESSOR) 25 MG tablet Take 25 mg by mouth 2 (two) times daily.   Yes Historical Provider, MD  rivaroxaban (XARELTO) 20 MG TABS tablet Take 20 mg by mouth daily.   Yes Historical Provider, MD  triamcinolone ointment (KENALOG) 0.1 % Apply 1 application topically 2 (two) times daily as needed (rash/irritation on legs).    Yes Historical Provider, MD  cephALEXin (KEFLEX) 500 MG capsule Take 1 capsule (500 mg total) by mouth 3 (three) times daily. Patient not taking: Reported on 07/12/2014 09/10/13   [name], MD  hydrALAZINE  (APRESOLINE) 25 MG tablet Take 1 tablet (25 mg total) by mouth 3 (three) times daily. Patient not taking: Reported on 03/28/2016 09/10/13   [name], MD  hydrOXYzine (ATARAX/VISTARIL) 25 MG tablet Take 1 tablet (25 mg total) by mouth 3 (three) times daily as needed for itching (refractory to benadryl). Patient not taking: Reported on 07/12/2014 01/01/13   Erline Hau, MD  ondansetron (ZOFRAN ODT) 4 MG disintegrating tablet Take 1 tablet (4 mg total) by mouth every 6 (six) hours as needed for nausea or vomiting. Patient not taking: Reported on 03/28/2016 07/12/14   Lily Kocher, PA-C    Physical Exam: Vitals:   03/29/16 0030 03/29/16 0100 03/29/16 0130 03/29/16 0200  BP: (!) 210/81 161/74 164/70 138/61  Pulse:      Resp: 19 16 16 16   Temp:      TempSrc:      SpO2:      Weight:      Height:          Constitutional: Moderately built and nourished. Vitals:   03/29/16 0030 03/29/16 0100 03/29/16 0130 03/29/16 0200  BP: (!) 210/81 161/74 164/70 138/61  Pulse:      Resp: 19 16 16 16   Temp:      TempSrc:      SpO2:      Weight:      Height:       Eyes: Anicteric. No pallor. ENMT: No discharge from the ears eyes nose and mouth. Neck: No mass felt. No neck rigidity. Respiratory: No rhonchi or crepitations. Cardiovascular: S1-S2 no murmurs appreciated. Abdomen: Soft nontender bowel sounds present. Musculoskeletal: Chronic edema bilaterally lower extremity. No joint effusion. Skin: No rash. Skin appears warm. Neurologic: Alert awake oriented to time place and person. Moves all extremities 5 x 5. No facial asymmetry tongue is midline. Psychiatric: Appears normal. Normal affect.   Labs on Admission: I have personally reviewed following labs and imaging studies  CBC:  Recent Labs Lab 03/28/16 2113 03/28/16 2121  WBC 5.9  --   NEUTROABS 2.5  --   HGB 14.6 15.3  HCT 43.3 45.0  MCV 85.6  --   PLT 210  --    Basic Metabolic Panel:  Recent Labs Lab  03/28/16 2113 03/28/16 2121  NA 139 143  K 3.3* 3.4*  CL 103 102  CO2 26  --   GLUCOSE 165* 164*  BUN 18 18  CREATININE 1.18 1.30*  CALCIUM 8.9  --    GFR: Estimated Creatinine Clearance: 74.3 mL/min (by C-G formula based on SCr of 1.3 mg/dL (H)). Liver Function Tests:  Recent Labs Lab 03/28/16 2113  AST 24  ALT 17  ALKPHOS 61  BILITOT 0.6  PROT 7.7  ALBUMIN 3.8   No results for input(s): LIPASE, AMYLASE in the last 168 hours. No results for input(s): AMMONIA in the last 168 hours. Coagulation Profile:  Recent Labs Lab 03/28/16 2113  INR 0.92   Cardiac Enzymes: No results for input(s): CKTOTAL, CKMB, CKMBINDEX, TROPONINI in the last 168 hours. BNP (last 3 results) No results for input(s): PROBNP in the last 8760 hours. HbA1C: No results for input(s): HGBA1C in the last 72 hours. CBG: No results for input(s): GLUCAP in the last 168 hours. Lipid Profile: No results for input(s): CHOL, HDL, LDLCALC, TRIG, CHOLHDL, LDLDIRECT in the last 72 hours. Thyroid Function Tests: No results for input(s): TSH, T4TOTAL, FREET4, T3FREE, THYROIDAB in the last 72 hours. Anemia Panel: No results for input(s): VITAMINB12, FOLATE, FERRITIN, TIBC, IRON, RETICCTPCT in the last 72 hours. Urine analysis:    Component Value Date/Time   COLORURINE YELLOW 03/29/2016 0227   APPEARANCEUR CLEAR 03/29/2016 0227   LABSPEC 1.021 03/29/2016 0227   PHURINE 5.0 03/29/2016 0227   GLUCOSEU 50 (A) 03/29/2016 0227   HGBUR SMALL (A) 03/29/2016 0227   BILIRUBINUR NEGATIVE 03/29/2016 0227   KETONESUR NEGATIVE 03/29/2016 0227   PROTEINUR 30 (A) 03/29/2016 0227   UROBILINOGEN 0.2 07/12/2014 1228   NITRITE NEGATIVE 03/29/2016 0227   LEUKOCYTESUR NEGATIVE 03/29/2016 0227   Sepsis Labs: @LABRCNTIP (procalcitonin:4,lacticidven:4) )No results found for this or any previous visit (from the past 240 hour(s)).   Radiological Exams on Admission: Mr Virgel Paling [id number] Contrast  Result Date: 03/29/2016 CLINICAL  DATA:  RIGHT hand numbness beginning at 7 p.m., progressive RIGHT leg numbness. History of hypertension, diabetes. EXAM: MRI HEAD WITHOUT CONTRAST MRA HEAD WITHOUT CONTRAST MRA NECK WITHOUT AND WITH CONTRAST TECHNIQUE: Multiplanar, multiecho pulse sequences of the brain and surrounding structures were obtained without intravenous contrast. Angiographic images of the Circle of Willis were obtained using MRA technique without intravenous contrast. Angiographic images of the neck were obtained using MRA technique without and with intravenous contrast. Carotid stenosis measurements (when applicable) are obtained utilizing NASCET criteria, using the distal internal carotid diameter as the denominator. CONTRAST:  69mL MULTIHANCE GADOBENATE DIMEGLUMINE 529 MG/ML IV SOLN COMPARISON:  CT HEAD March 28, 2016 at 2117 hours FINDINGS: MRI HEAD FINDINGS- many sequences are moderately motion degraded. BRAIN: No reduced diffusion to suggest acute ischemia. No susceptibility artifact to suggest hemorrhage. Moderate ventriculomegaly on the basis of global parenchymal brain volume loss. Confluent supratentorial white matter FLAIR T2 hyperintensities. Multiple old pontine, cerebellar infarcts. Old bilateral thalamus and bilateral basal ganglia lacunar infarcts. No midline shift, mass effect or masses. No abnormal extra-axial fluid collections. VASCULAR: Normal major intracranial vascular flow voids present at skull base. SKULL AND UPPER CERVICAL SPINE: No abnormal sellar expansion. No suspicious calvarial bone marrow signal. Craniocervical junction maintained. SINUSES/ORBITS: The mastoid air-cells and included paranasal sinuses are well-aerated. Status post bilateral ocular lens implants. The included ocular globes and orbital contents are non-suspicious. OTHER: None. MRA HEAD FINDINGS- moderately motion degraded examination. ANTERIOR CIRCULATION: Flow related enhancement within bilateral internal carotid arteries. However, is at  least moderate stenosis bilateral petrous segments. Severe stenosis RIGHT greater than LEFT carotid siphons. Bilateral anterior middle cerebral arteries are patent, limited assessment due to motion. Supernumerary anterior cerebral artery. POSTERIOR CIRCULATION: No demonstrated RIGHT vertebral artery. LEFT vertebral artery is widely patent. Flow related enhancement bilateral posterior inferior cerebellar arteries. Moderate stenosis proximal basilar artery. Robust LEFT posterior communicating artery present. Moderate tandem stenoses RIGHT posterior cerebral artery. Poor, thready flow related enhancement LEFT posterior-inferior cerebellar artery with  LEFT P1 segment severe stenosis. ANATOMIC VARIANTS: None. MRA NECK FINDINGS- mild motion degraded examination. ANTERIOR CIRCULATION: Mild stenosis LEFT Common carotid artery origin. Bilateral internal carotid arteries are widely patent. Mild stenosis LEFT mid cervical internal carotid artery compatible with atherosclerosis. POSTERIOR CIRCULATION: RIGHT vertebral artery is occluded from the origin without significant reconstitution. Moderate luminal irregularity LEFT vertebral artery including the origin. Source images and MIP image were reviewed. IMPRESSION: MRI HEAD: No acute intracranial process on this motion degraded examination. 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 Moderate to severe chronic small vessel ischemic disease. Moderate global parenchymal brain volume loss, advanced for age. MRA HEAD: Motion degraded examination. RIGHT vertebral artery occlusion, likely old. Severe stenoses bilateral internal carotid arteries due to atherosclerosis. Multifocal moderate stenoses posterior circulation compatible with atherosclerosis. MRA NECK: Occluded RIGHT vertebral artery without significant reconstitution. Moderate atherosclerosis LEFT vertebral artery. No hemodynamically significant stenosis of the carotid arteries. Electronically Signed   By:  Elon Alas M.D.   On: 03/29/2016 00:04   Mr Angiogram Neck W Or Wo Contrast  Result Date: 03/29/2016 CLINICAL DATA:  RIGHT hand numbness beginning at 7 p.m., progressive RIGHT leg numbness. History of hypertension, diabetes. EXAM: MRI HEAD WITHOUT CONTRAST MRA HEAD WITHOUT CONTRAST MRA NECK WITHOUT AND WITH CONTRAST TECHNIQUE: Multiplanar, multiecho pulse sequences of the brain and surrounding structures were obtained without intravenous contrast. Angiographic images of the Circle of Willis were obtained using MRA technique without intravenous contrast. Angiographic images of the neck were obtained using MRA technique without and with intravenous contrast. Carotid stenosis measurements (when applicable) are obtained utilizing NASCET criteria, using the distal internal carotid diameter as the denominator. CONTRAST:  37mL MULTIHANCE GADOBENATE DIMEGLUMINE 529 MG/ML IV SOLN COMPARISON:  CT HEAD March 28, 2016 at 2117 hours FINDINGS: MRI HEAD FINDINGS- many sequences are moderately motion degraded. BRAIN: No reduced diffusion to suggest acute ischemia. No susceptibility artifact to suggest hemorrhage. Moderate ventriculomegaly on the basis of global parenchymal brain volume loss. Confluent supratentorial white matter FLAIR T2 hyperintensities. Multiple old pontine, cerebellar infarcts. Old bilateral thalamus and bilateral basal ganglia lacunar infarcts. No midline shift, mass effect or masses. No abnormal extra-axial fluid collections. VASCULAR: Normal major intracranial vascular flow voids present at skull base. SKULL AND UPPER CERVICAL SPINE: No abnormal sellar expansion. No suspicious calvarial bone marrow signal. Craniocervical junction maintained. SINUSES/ORBITS: The mastoid air-cells and included paranasal sinuses are well-aerated. Status post bilateral ocular lens implants. The included ocular globes and orbital contents are non-suspicious. OTHER: None. MRA HEAD FINDINGS- moderately motion degraded  examination. ANTERIOR CIRCULATION: Flow related enhancement within bilateral internal carotid arteries. However, is at least moderate stenosis bilateral petrous segments. Severe stenosis RIGHT greater than LEFT carotid siphons. Bilateral anterior middle cerebral arteries are patent, limited assessment due to motion. Supernumerary anterior cerebral artery. POSTERIOR CIRCULATION: No demonstrated RIGHT vertebral artery. LEFT vertebral artery is widely patent. Flow related enhancement bilateral posterior inferior cerebellar arteries. Moderate stenosis proximal basilar artery. Robust LEFT posterior communicating artery present. Moderate tandem stenoses RIGHT posterior cerebral artery. Poor, thready flow related enhancement LEFT posterior-inferior cerebellar artery with LEFT P1 segment severe stenosis. ANATOMIC VARIANTS: None. MRA NECK FINDINGS- mild motion degraded examination. ANTERIOR CIRCULATION: Mild stenosis LEFT Common carotid artery origin. Bilateral internal carotid arteries are widely patent. Mild stenosis LEFT mid cervical internal carotid artery compatible with atherosclerosis. POSTERIOR CIRCULATION: RIGHT vertebral artery is occluded from the origin without significant reconstitution. Moderate luminal irregularity LEFT vertebral artery including the origin. Source images and MIP image were reviewed. IMPRESSION:  MRI HEAD: No acute intracranial process on this motion degraded examination. 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 Moderate to severe chronic small vessel ischemic disease. Moderate global parenchymal brain volume loss, advanced for age. MRA HEAD: Motion degraded examination. RIGHT vertebral artery occlusion, likely old. Severe stenoses bilateral internal carotid arteries due to atherosclerosis. Multifocal moderate stenoses posterior circulation compatible with atherosclerosis. MRA NECK: Occluded RIGHT vertebral artery without significant reconstitution. Moderate atherosclerosis  LEFT vertebral artery. No hemodynamically significant stenosis of the carotid arteries. Electronically Signed   By: Elon Alas M.D.   On: 03/29/2016 00:04   Mr Brain Wo Contrast  Result Date: 03/29/2016 CLINICAL DATA:  RIGHT hand numbness beginning at 7 p.m., progressive RIGHT leg numbness. History of hypertension, diabetes. EXAM: MRI HEAD WITHOUT CONTRAST MRA HEAD WITHOUT CONTRAST MRA NECK WITHOUT AND WITH CONTRAST TECHNIQUE: Multiplanar, multiecho pulse sequences of the brain and surrounding structures were obtained without intravenous contrast. Angiographic images of the Circle of Willis were obtained using MRA technique without intravenous contrast. Angiographic images of the neck were obtained using MRA technique without and with intravenous contrast. Carotid stenosis measurements (when applicable) are obtained utilizing NASCET criteria, using the distal internal carotid diameter as the denominator. CONTRAST:  21mL MULTIHANCE GADOBENATE DIMEGLUMINE 529 MG/ML IV SOLN COMPARISON:  CT HEAD March 28, 2016 at 2117 hours FINDINGS: MRI HEAD FINDINGS- many sequences are moderately motion degraded. BRAIN: No reduced diffusion to suggest acute ischemia. No susceptibility artifact to suggest hemorrhage. Moderate ventriculomegaly on the basis of global parenchymal brain volume loss. Confluent supratentorial white matter FLAIR T2 hyperintensities. Multiple old pontine, cerebellar infarcts. Old bilateral thalamus and bilateral basal ganglia lacunar infarcts. No midline shift, mass effect or masses. No abnormal extra-axial fluid collections. VASCULAR: Normal major intracranial vascular flow voids present at skull base. SKULL AND UPPER CERVICAL SPINE: No abnormal sellar expansion. No suspicious calvarial bone marrow signal. Craniocervical junction maintained. SINUSES/ORBITS: The mastoid air-cells and included paranasal sinuses are well-aerated. Status post bilateral ocular lens implants. The included ocular globes  and orbital contents are non-suspicious. OTHER: None. MRA HEAD FINDINGS- moderately motion degraded examination. ANTERIOR CIRCULATION: Flow related enhancement within bilateral internal carotid arteries. However, is at least moderate stenosis bilateral petrous segments. Severe stenosis RIGHT greater than LEFT carotid siphons. Bilateral anterior middle cerebral arteries are patent, limited assessment due to motion. Supernumerary anterior cerebral artery. POSTERIOR CIRCULATION: No demonstrated RIGHT vertebral artery. LEFT vertebral artery is widely patent. Flow related enhancement bilateral posterior inferior cerebellar arteries. Moderate stenosis proximal basilar artery. Robust LEFT posterior communicating artery present. Moderate tandem stenoses RIGHT posterior cerebral artery. Poor, thready flow related enhancement LEFT posterior-inferior cerebellar artery with LEFT P1 segment severe stenosis. ANATOMIC VARIANTS: None. MRA NECK FINDINGS- mild motion degraded examination. ANTERIOR CIRCULATION: Mild stenosis LEFT Common carotid artery origin. Bilateral internal carotid arteries are widely patent. Mild stenosis LEFT mid cervical internal carotid artery compatible with atherosclerosis. POSTERIOR CIRCULATION: RIGHT vertebral artery is occluded from the origin without significant reconstitution. Moderate luminal irregularity LEFT vertebral artery including the origin. Source images and MIP image were reviewed. IMPRESSION: MRI HEAD: No acute intracranial process on this motion degraded examination. 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 Moderate to severe chronic small vessel ischemic disease. Moderate global parenchymal brain volume loss, advanced for age. MRA HEAD: Motion degraded examination. RIGHT vertebral artery occlusion, likely old. Severe stenoses bilateral internal carotid arteries due to atherosclerosis. Multifocal moderate stenoses posterior circulation compatible with atherosclerosis.  MRA NECK: Occluded RIGHT vertebral artery without significant reconstitution. Moderate  atherosclerosis LEFT vertebral artery. No hemodynamically significant stenosis of the carotid arteries. Electronically Signed   By: Elon Alas M.D.   On: 03/29/2016 00:04   Ct Head Code Stroke W/o Cm  Result Date: 03/28/2016 CLINICAL DATA:  Code stroke.  Right-sided numbness. EXAM: CT HEAD WITHOUT CONTRAST TECHNIQUE: Contiguous axial images were obtained from the base of the skull through the vertex without intravenous contrast. COMPARISON:  09/14/2010 CT head. FINDINGS: Brain: No evidence of acute infarction, hemorrhage, hydrocephalus, extra-axial collection or mass lesion/mass effect. Nonspecific foci of hypoattenuation in subcortical and periventricular white matter are stable and compatible with moderate chronic microvascular ischemic changes. Mild brain parenchymal volume loss. Chronic left pontine lacunar infarct. Small hypodense focus in left caudate head and left lentiform nucleus probably represent chronic lacunar infarcts. Stable mild brain parenchymal volume loss. Vascular: Calcific atherosclerosis of skullbase internal carotid artery is and vertebral arteries. No hyperdense vessel. Skull: Normal. Negative for fracture or focal lesion. Sinuses/Orbits: No acute finding. Other: None. ASPECTS [facility] Stroke Program Early CT Score) - Ganglionic level infarction (caudate, lentiform nuclei, internal capsule, insula, M1-M3 cortex): 7 - Supraganglionic infarction (M4-M6 cortex): 3 Total score (0-10 with 10 being normal): 10 IMPRESSION: 1. No acute intracranial abnormality identified. 2. ASPECTS is 10 3. Stable moderate chronic microvascular ischemic changes of the brain, chronic left basal ganglia and pontine lacunar infarcts, and mild brain parenchymal volume loss. These results were called by telephone at the time of interpretation on 03/28/2016 at 9:38 pm to Dr. Brantley Stage , who verbally acknowledged these results.  Electronically Signed   By: Kristine Garbe M.D.   On: 03/28/2016 21:40    EKG: Independently reviewed. Normal sinus rhythm with nonspecific ST changes.  Assessment/Plan Principal Problem:   TIA (transient ischemic attack) Active Problems:   Diabetes type 2, uncontrolled (HCC)   Cardiomyopathy ([place])   DVT (deep venous thrombosis) (HCC)   Numbness on right side   Hypertensive urgency    1. TIA - neurologist Dr. Cheral Marker consult. Neurologist has requested patient be [facility]. Since MRI is not showing anything acute will get MRI C-spine. Check 2-D echo. Patient is on xarelto. Physical therapy consult. Patient has passed swallow. 2. Hypertensive urgency - initial blood pressure was markedly elevated more than [id number] systolic. Improved without any intervention. Patient is on hydralazine and clonidine Hyzaar and Lopressor. Closely follow blood pressure trends. 3. History of DVT - has missed his xarelto dose last 3 days. Xarelto has been restarted. 4. Diabetes mellitus type 2 - patient is on NovoLog 70/30 twice a day. Closely follow CBGs. 5. History of CHF last EF measured in 2014 was 60-65% with grade 1 diastolic dysfunction - appears compensated. On Lasix.  Chest x-ray is pending.  Dr. Norina Buzzard will be the accepting physician.   DVT prophylaxis: Xarelto. Code Status: Full code.  Family Communication: Patient's sister.  Disposition Plan: Home.  Consults called: Neurology.  Admission status: Observation.    Rise Patience MD [facility] Pager [phone].  If 7PM-7AM, please contact night-coverage [email or web address] Password [email or web address]  03/29/2016, 3:22 AM

## 2016-03-29 NOTE — Progress Notes (Signed)
Patient arrived to unit.  Alert verbal.  No complaints of pain or discomfort.

## 2016-03-29 NOTE — Progress Notes (Signed)
Patient has meds from home that need to be locked up 

## 2016-03-29 NOTE — Progress Notes (Signed)
ED CM consulted bu UR CM ED CM spoke with ED unit secretary to check on [name] bed ED CM spoke with Dr Darrick Meigs who has re assessed pt who is schedule for a cervical spine MRI tomorrow am UR CM updated

## 2016-03-30 ENCOUNTER — Observation Stay (HOSPITAL_COMMUNITY): Payer: Medicare Other

## 2016-03-30 DIAGNOSIS — R202 Paresthesia of skin: Secondary | ICD-10-CM | POA: Diagnosis not present

## 2016-03-30 DIAGNOSIS — I429 Cardiomyopathy, unspecified: Secondary | ICD-10-CM | POA: Diagnosis not present

## 2016-03-30 DIAGNOSIS — N390 Urinary tract infection, site not specified: Secondary | ICD-10-CM | POA: Diagnosis not present

## 2016-03-30 DIAGNOSIS — I150 Renovascular hypertension: Secondary | ICD-10-CM | POA: Diagnosis not present

## 2016-03-30 DIAGNOSIS — F4323 Adjustment disorder with mixed anxiety and depressed mood: Secondary | ICD-10-CM | POA: Diagnosis not present

## 2016-03-30 DIAGNOSIS — I2510 Atherosclerotic heart disease of native coronary artery without angina pectoris: Secondary | ICD-10-CM | POA: Diagnosis not present

## 2016-03-30 DIAGNOSIS — Z79899 Other long term (current) drug therapy: Secondary | ICD-10-CM | POA: Diagnosis not present

## 2016-03-30 DIAGNOSIS — R200 Anesthesia of skin: Secondary | ICD-10-CM | POA: Diagnosis not present

## 2016-03-30 DIAGNOSIS — W07XXXD Fall from chair, subsequent encounter: Secondary | ICD-10-CM | POA: Diagnosis not present

## 2016-03-30 DIAGNOSIS — E785 Hyperlipidemia, unspecified: Secondary | ICD-10-CM | POA: Diagnosis present

## 2016-03-30 DIAGNOSIS — I420 Dilated cardiomyopathy: Secondary | ICD-10-CM | POA: Diagnosis not present

## 2016-03-30 DIAGNOSIS — I5043 Acute on chronic combined systolic (congestive) and diastolic (congestive) heart failure: Secondary | ICD-10-CM | POA: Diagnosis not present

## 2016-03-30 DIAGNOSIS — R4189 Other symptoms and signs involving cognitive functions and awareness: Secondary | ICD-10-CM | POA: Diagnosis not present

## 2016-03-30 DIAGNOSIS — M545 Low back pain: Secondary | ICD-10-CM | POA: Diagnosis not present

## 2016-03-30 DIAGNOSIS — D62 Acute posthemorrhagic anemia: Secondary | ICD-10-CM | POA: Diagnosis not present

## 2016-03-30 DIAGNOSIS — Z794 Long term (current) use of insulin: Secondary | ICD-10-CM | POA: Diagnosis not present

## 2016-03-30 DIAGNOSIS — I6501 Occlusion and stenosis of right vertebral artery: Secondary | ICD-10-CM | POA: Diagnosis present

## 2016-03-30 DIAGNOSIS — Z86718 Personal history of other venous thrombosis and embolism: Secondary | ICD-10-CM | POA: Diagnosis not present

## 2016-03-30 DIAGNOSIS — H5462 Unqualified visual loss, left eye, normal vision right eye: Secondary | ICD-10-CM | POA: Diagnosis present

## 2016-03-30 DIAGNOSIS — E1151 Type 2 diabetes mellitus with diabetic peripheral angiopathy without gangrene: Secondary | ICD-10-CM | POA: Diagnosis present

## 2016-03-30 DIAGNOSIS — R269 Unspecified abnormalities of gait and mobility: Secondary | ICD-10-CM | POA: Diagnosis not present

## 2016-03-30 DIAGNOSIS — Z7901 Long term (current) use of anticoagulants: Secondary | ICD-10-CM | POA: Diagnosis not present

## 2016-03-30 DIAGNOSIS — I248 Other forms of acute ischemic heart disease: Secondary | ICD-10-CM | POA: Diagnosis present

## 2016-03-30 DIAGNOSIS — E7800 Pure hypercholesterolemia, unspecified: Secondary | ICD-10-CM | POA: Diagnosis present

## 2016-03-30 DIAGNOSIS — E11319 Type 2 diabetes mellitus with unspecified diabetic retinopathy without macular edema: Secondary | ICD-10-CM | POA: Diagnosis present

## 2016-03-30 DIAGNOSIS — I63432 Cerebral infarction due to embolism of left posterior cerebral artery: Secondary | ICD-10-CM | POA: Diagnosis not present

## 2016-03-30 DIAGNOSIS — R7309 Other abnormal glucose: Secondary | ICD-10-CM | POA: Diagnosis not present

## 2016-03-30 DIAGNOSIS — I639 Cerebral infarction, unspecified: Secondary | ICD-10-CM | POA: Diagnosis not present

## 2016-03-30 DIAGNOSIS — N179 Acute kidney failure, unspecified: Secondary | ICD-10-CM | POA: Diagnosis not present

## 2016-03-30 DIAGNOSIS — G458 Other transient cerebral ischemic attacks and related syndromes: Secondary | ICD-10-CM | POA: Diagnosis not present

## 2016-03-30 DIAGNOSIS — I160 Hypertensive urgency: Secondary | ICD-10-CM | POA: Diagnosis not present

## 2016-03-30 DIAGNOSIS — M47812 Spondylosis without myelopathy or radiculopathy, cervical region: Secondary | ICD-10-CM | POA: Diagnosis present

## 2016-03-30 DIAGNOSIS — E1165 Type 2 diabetes mellitus with hyperglycemia: Secondary | ICD-10-CM | POA: Diagnosis present

## 2016-03-30 DIAGNOSIS — I6339 Cerebral infarction due to thrombosis of other cerebral artery: Secondary | ICD-10-CM | POA: Diagnosis not present

## 2016-03-30 DIAGNOSIS — N182 Chronic kidney disease, stage 2 (mild): Secondary | ICD-10-CM | POA: Diagnosis not present

## 2016-03-30 DIAGNOSIS — E1159 Type 2 diabetes mellitus with other circulatory complications: Secondary | ICD-10-CM | POA: Diagnosis not present

## 2016-03-30 DIAGNOSIS — I130 Hypertensive heart and chronic kidney disease with heart failure and stage 1 through stage 4 chronic kidney disease, or unspecified chronic kidney disease: Secondary | ICD-10-CM | POA: Diagnosis present

## 2016-03-30 DIAGNOSIS — I69393 Ataxia following cerebral infarction: Secondary | ICD-10-CM | POA: Diagnosis not present

## 2016-03-30 DIAGNOSIS — E876 Hypokalemia: Secondary | ICD-10-CM | POA: Diagnosis not present

## 2016-03-30 DIAGNOSIS — E1142 Type 2 diabetes mellitus with diabetic polyneuropathy: Secondary | ICD-10-CM | POA: Diagnosis present

## 2016-03-30 DIAGNOSIS — I5020 Unspecified systolic (congestive) heart failure: Secondary | ICD-10-CM | POA: Diagnosis present

## 2016-03-30 DIAGNOSIS — R5383 Other fatigue: Secondary | ICD-10-CM | POA: Diagnosis not present

## 2016-03-30 DIAGNOSIS — R29701 NIHSS score 1: Secondary | ICD-10-CM | POA: Diagnosis present

## 2016-03-30 DIAGNOSIS — I428 Other cardiomyopathies: Secondary | ICD-10-CM | POA: Diagnosis present

## 2016-03-30 DIAGNOSIS — G8191 Hemiplegia, unspecified affecting right dominant side: Secondary | ICD-10-CM | POA: Diagnosis present

## 2016-03-30 DIAGNOSIS — I10 Essential (primary) hypertension: Secondary | ICD-10-CM | POA: Diagnosis not present

## 2016-03-30 DIAGNOSIS — R4689 Other symptoms and signs involving appearance and behavior: Secondary | ICD-10-CM | POA: Diagnosis not present

## 2016-03-30 DIAGNOSIS — Z9049 Acquired absence of other specified parts of digestive tract: Secondary | ICD-10-CM | POA: Diagnosis not present

## 2016-03-30 LAB — GLUCOSE, CAPILLARY
GLUCOSE-CAPILLARY: 238 mg/dL — AB (ref 65–99)
GLUCOSE-CAPILLARY: 99 mg/dL (ref 65–99)
Glucose-Capillary: 200 mg/dL — ABNORMAL HIGH (ref 65–99)
Glucose-Capillary: 201 mg/dL — ABNORMAL HIGH (ref 65–99)
Glucose-Capillary: 118 mg/dL — ABNORMAL HIGH (ref 65–99)

## 2016-03-30 LAB — HEMOGLOBIN A1C
HEMOGLOBIN A1C: 11 % — AB (ref 4.8–5.6)
Mean Plasma Glucose: 269 mg/dL

## 2016-03-30 MED ORDER — INSULIN ASPART PROT & ASPART (70-30 MIX) 100 UNIT/ML ~~LOC~~ SUSP
35.0000 [IU] | Freq: Every day | SUBCUTANEOUS | Status: DC
  Administered 2016-03-30 – 2016-04-02 (×4): 35 [IU] via SUBCUTANEOUS

## 2016-03-30 MED ORDER — INSULIN ASPART PROT & ASPART (70-30 MIX) 100 UNIT/ML ~~LOC~~ SUSP
55.0000 [IU] | Freq: Every day | SUBCUTANEOUS | Status: DC
  Filled 2016-03-30: qty 10

## 2016-03-30 NOTE — Progress Notes (Signed)
SLP Cancellation Note  Patient Details Name: [name] MRN: [id number] DOB: 03/25/1955   Cancelled treatment:       Reason Eval/Treat Not Completed: SLP screened, no needs identified, will sign off. Spoke with pt briefly. Pt has not noticed any changes in speech/ language or cognition, no deficits noted in conversation, oriented x4, recalls events leading to hospital admission. MRI showed no acute findings. Will sign off; please re-consult if needs arise.   Kern Reap, [place], CCC-SLP 03/30/2016, 11:00 AM [phone]

## 2016-03-30 NOTE — Progress Notes (Signed)
Rehab Admissions Coordinator Note:  Patient was screened by Retta Diones for appropriateness for an Inpatient Acute Rehab Consult.  At this time, neurology workup is pending and patient is under observation status.  If status changes in inpatient and workup reveals diagnosis that is amenable to rehab stay, then can consider ordering inpatient rehab consult.    Retta Diones 03/30/2016, 12:20 PM  I can be reached at [phone].

## 2016-03-30 NOTE — Progress Notes (Signed)
Progress Note  Patient Name: [name] Date of Encounter: 03/30/2016  Primary Cardiologist: New  Subjective   No chest pain or SOB.   He does have right sided numbness still.   Inpatient Medications    Scheduled Meds: .  stroke: mapping our early stages of recovery book   Does not apply Once  . carvedilol  25 mg Oral BID WC  . chlorthalidone  25 mg Oral Daily  . cloNIDine  0.2 mg Oral TID  . furosemide  80 mg Oral Daily  . hydrALAZINE  50 mg Oral Q6H  . insulin aspart  0-9 Units Subcutaneous TID WC  . insulin aspart protamine- aspart  32 Units Subcutaneous Q supper  . insulin aspart protamine- aspart  52 Units Subcutaneous Q breakfast  . isosorbide mononitrate  60 mg Oral Daily  . losartan  100 mg Oral Daily  . rivaroxaban  20 mg Oral Q supper   Continuous Infusions:  PRN Meds: acetaminophen **OR** acetaminophen (TYLENOL) oral liquid 160 mg/5 mL **OR** acetaminophen, bismuth subsalicylate   Vital Signs    Vitals:   03/29/16 2353 03/30/16 0146 03/30/16 0400 03/30/16 0600  BP:  (!) 142/75 (!) 115/92 115/90  Pulse:  65 72   Resp:  16 18   Temp: 97.4 F (36.3 C) 97.4 F (36.3 C)    TempSrc: Oral Oral    SpO2:  96% 98%   Weight:      Height:        Intake/Output Summary (Last 24 hours) at 03/30/16 [id number] Last data filed at 03/29/16 2353  Gross per 24 hour  Intake                0 ml  Output             1175 ml  Net            -1175 ml   Filed Weights   03/28/16 2044  Weight: 245 lb (111.1 kg)    Telemetry    NSR, PVCs, short run of IVR. - Personally Reviewed  ECG    NA - Personally Reviewed  Physical Exam   GEN: No acute distress.   Neck: No JVD Cardiac: RRR, no murmurs, rubs, or gallops.  Respiratory: Clear to auscultation bilaterally. GI: Soft, nontender, non-distended  MS: No edema; No deformity. Neuro:  Nonfocal  Psych: Normal affect   Labs    Chemistry Recent Labs Lab 03/28/16 2113 03/28/16 2121  NA 139 143  K 3.3* 3.4*  CL  103 102  CO2 26  --   GLUCOSE 165* 164*  BUN 18 18  CREATININE 1.18 1.30*  CALCIUM 8.9  --   PROT 7.7  --   ALBUMIN 3.8  --   AST 24  --   ALT 17  --   ALKPHOS 61  --   BILITOT 0.6  --   GFRNONAA >60  --   GFRAA >60  --   ANIONGAP 10  --      Hematology Recent Labs Lab 03/28/16 2113 03/28/16 2121  WBC 5.9  --   RBC 5.06  --   HGB 14.6 15.3  HCT 43.3 45.0  MCV 85.6  --   MCH 28.9  --   MCHC 33.7  --   RDW 12.7  --   PLT 210  --     Cardiac Enzymes Recent Labs Lab 03/29/16 0931  TROPONINI 0.14*    Recent Labs Lab 03/28/16 2119  TROPIPOC  0.07     BNPNo results for input(s): BNP, PROBNP in the last 168 hours.   DDimer No results for input(s): DDIMER in the last 168 hours.   Radiology    Mr Jodene Nam Head Wo Contrast  Result Date: 03/29/2016 CLINICAL DATA:  RIGHT hand numbness beginning at 7 p.m., progressive RIGHT leg numbness. History of hypertension, diabetes. EXAM: MRI HEAD WITHOUT CONTRAST MRA HEAD WITHOUT CONTRAST MRA NECK WITHOUT AND WITH CONTRAST TECHNIQUE: Multiplanar, multiecho pulse sequences of the brain and surrounding structures were obtained without intravenous contrast. Angiographic images of the Circle of Willis were obtained using MRA technique without intravenous contrast. Angiographic images of the neck were obtained using MRA technique without and with intravenous contrast. Carotid stenosis measurements (when applicable) are obtained utilizing NASCET criteria, using the distal internal carotid diameter as the denominator. CONTRAST:  58mL MULTIHANCE GADOBENATE DIMEGLUMINE 529 MG/ML IV SOLN COMPARISON:  CT HEAD March 28, 2016 at 2117 hours FINDINGS: MRI HEAD FINDINGS- many sequences are moderately motion degraded. BRAIN: No reduced diffusion to suggest acute ischemia. No susceptibility artifact to suggest hemorrhage. Moderate ventriculomegaly on the basis of global parenchymal brain volume loss. Confluent supratentorial white matter FLAIR T2  hyperintensities. Multiple old pontine, cerebellar infarcts. Old bilateral thalamus and bilateral basal ganglia lacunar infarcts. No midline shift, mass effect or masses. No abnormal extra-axial fluid collections. VASCULAR: Normal major intracranial vascular flow voids present at skull base. SKULL AND UPPER CERVICAL SPINE: No abnormal sellar expansion. No suspicious calvarial bone marrow signal. Craniocervical junction maintained. SINUSES/ORBITS: The mastoid air-cells and included paranasal sinuses are well-aerated. Status post bilateral ocular lens implants. The included ocular globes and orbital contents are non-suspicious. OTHER: None. MRA HEAD FINDINGS- moderately motion degraded examination. ANTERIOR CIRCULATION: Flow related enhancement within bilateral internal carotid arteries. However, is at least moderate stenosis bilateral petrous segments. Severe stenosis RIGHT greater than LEFT carotid siphons. Bilateral anterior middle cerebral arteries are patent, limited assessment due to motion. Supernumerary anterior cerebral artery. POSTERIOR CIRCULATION: No demonstrated RIGHT vertebral artery. LEFT vertebral artery is widely patent. Flow related enhancement bilateral posterior inferior cerebellar arteries. Moderate stenosis proximal basilar artery. Robust LEFT posterior communicating artery present. Moderate tandem stenoses RIGHT posterior cerebral artery. Poor, thready flow related enhancement LEFT posterior-inferior cerebellar artery with LEFT P1 segment severe stenosis. ANATOMIC VARIANTS: None. MRA NECK FINDINGS- mild motion degraded examination. ANTERIOR CIRCULATION: Mild stenosis LEFT Common carotid artery origin. Bilateral internal carotid arteries are widely patent. Mild stenosis LEFT mid cervical internal carotid artery compatible with atherosclerosis. POSTERIOR CIRCULATION: RIGHT vertebral artery is occluded from the origin without significant reconstitution. Moderate luminal irregularity LEFT vertebral  artery including the origin. Source images and MIP image were reviewed. IMPRESSION: MRI HEAD: No acute intracranial process on this motion degraded examination. 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 Moderate to severe chronic small vessel ischemic disease. Moderate global parenchymal brain volume loss, advanced for age. MRA HEAD: Motion degraded examination. RIGHT vertebral artery occlusion, likely old. Severe stenoses bilateral internal carotid arteries due to atherosclerosis. Multifocal moderate stenoses posterior circulation compatible with atherosclerosis. MRA NECK: Occluded RIGHT vertebral artery without significant reconstitution. Moderate atherosclerosis LEFT vertebral artery. No hemodynamically significant stenosis of the carotid arteries. Electronically Signed   By: Elon Alas M.D.   On: 03/29/2016 00:04   Mr Angiogram Neck W Or Wo Contrast  Result Date: 03/29/2016 CLINICAL DATA:  RIGHT hand numbness beginning at 7 p.m., progressive RIGHT leg numbness. History of hypertension, diabetes. EXAM: MRI HEAD WITHOUT CONTRAST MRA HEAD WITHOUT CONTRAST MRA  NECK WITHOUT AND WITH CONTRAST TECHNIQUE: Multiplanar, multiecho pulse sequences of the brain and surrounding structures were obtained without intravenous contrast. Angiographic images of the Circle of Willis were obtained using MRA technique without intravenous contrast. Angiographic images of the neck were obtained using MRA technique without and with intravenous contrast. Carotid stenosis measurements (when applicable) are obtained utilizing NASCET criteria, using the distal internal carotid diameter as the denominator. CONTRAST:  15mL MULTIHANCE GADOBENATE DIMEGLUMINE 529 MG/ML IV SOLN COMPARISON:  CT HEAD March 28, 2016 at 2117 hours FINDINGS: MRI HEAD FINDINGS- many sequences are moderately motion degraded. BRAIN: No reduced diffusion to suggest acute ischemia. No susceptibility artifact to suggest hemorrhage. Moderate  ventriculomegaly on the basis of global parenchymal brain volume loss. Confluent supratentorial white matter FLAIR T2 hyperintensities. Multiple old pontine, cerebellar infarcts. Old bilateral thalamus and bilateral basal ganglia lacunar infarcts. No midline shift, mass effect or masses. No abnormal extra-axial fluid collections. VASCULAR: Normal major intracranial vascular flow voids present at skull base. SKULL AND UPPER CERVICAL SPINE: No abnormal sellar expansion. No suspicious calvarial bone marrow signal. Craniocervical junction maintained. SINUSES/ORBITS: The mastoid air-cells and included paranasal sinuses are well-aerated. Status post bilateral ocular lens implants. The included ocular globes and orbital contents are non-suspicious. OTHER: None. MRA HEAD FINDINGS- moderately motion degraded examination. ANTERIOR CIRCULATION: Flow related enhancement within bilateral internal carotid arteries. However, is at least moderate stenosis bilateral petrous segments. Severe stenosis RIGHT greater than LEFT carotid siphons. Bilateral anterior middle cerebral arteries are patent, limited assessment due to motion. Supernumerary anterior cerebral artery. POSTERIOR CIRCULATION: No demonstrated RIGHT vertebral artery. LEFT vertebral artery is widely patent. Flow related enhancement bilateral posterior inferior cerebellar arteries. Moderate stenosis proximal basilar artery. Robust LEFT posterior communicating artery present. Moderate tandem stenoses RIGHT posterior cerebral artery. Poor, thready flow related enhancement LEFT posterior-inferior cerebellar artery with LEFT P1 segment severe stenosis. ANATOMIC VARIANTS: None. MRA NECK FINDINGS- mild motion degraded examination. ANTERIOR CIRCULATION: Mild stenosis LEFT Common carotid artery origin. Bilateral internal carotid arteries are widely patent. Mild stenosis LEFT mid cervical internal carotid artery compatible with atherosclerosis. POSTERIOR CIRCULATION: RIGHT  vertebral artery is occluded from the origin without significant reconstitution. Moderate luminal irregularity LEFT vertebral artery including the origin. Source images and MIP image were reviewed. IMPRESSION: MRI HEAD: No acute intracranial process on this motion degraded examination. 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 Moderate to severe chronic small vessel ischemic disease. Moderate global parenchymal brain volume loss, advanced for age. MRA HEAD: Motion degraded examination. RIGHT vertebral artery occlusion, likely old. Severe stenoses bilateral internal carotid arteries due to atherosclerosis. Multifocal moderate stenoses posterior circulation compatible with atherosclerosis. MRA NECK: Occluded RIGHT vertebral artery without significant reconstitution. Moderate atherosclerosis LEFT vertebral artery. No hemodynamically significant stenosis of the carotid arteries. Electronically Signed   By: Elon Alas M.D.   On: 03/29/2016 00:04   Mr Brain Wo Contrast  Result Date: 03/29/2016 CLINICAL DATA:  RIGHT hand numbness beginning at 7 p.m., progressive RIGHT leg numbness. History of hypertension, diabetes. EXAM: MRI HEAD WITHOUT CONTRAST MRA HEAD WITHOUT CONTRAST MRA NECK WITHOUT AND WITH CONTRAST TECHNIQUE: Multiplanar, multiecho pulse sequences of the brain and surrounding structures were obtained without intravenous contrast. Angiographic images of the Circle of Willis were obtained using MRA technique without intravenous contrast. Angiographic images of the neck were obtained using MRA technique without and with intravenous contrast. Carotid stenosis measurements (when applicable) are obtained utilizing NASCET criteria, using the distal internal carotid diameter as the denominator. CONTRAST:  57mL MULTIHANCE GADOBENATE  DIMEGLUMINE 529 MG/ML IV SOLN COMPARISON:  CT HEAD March 28, 2016 at 2117 hours FINDINGS: MRI HEAD FINDINGS- many sequences are moderately motion degraded.  BRAIN: No reduced diffusion to suggest acute ischemia. No susceptibility artifact to suggest hemorrhage. Moderate ventriculomegaly on the basis of global parenchymal brain volume loss. Confluent supratentorial white matter FLAIR T2 hyperintensities. Multiple old pontine, cerebellar infarcts. Old bilateral thalamus and bilateral basal ganglia lacunar infarcts. No midline shift, mass effect or masses. No abnormal extra-axial fluid collections. VASCULAR: Normal major intracranial vascular flow voids present at skull base. SKULL AND UPPER CERVICAL SPINE: No abnormal sellar expansion. No suspicious calvarial bone marrow signal. Craniocervical junction maintained. SINUSES/ORBITS: The mastoid air-cells and included paranasal sinuses are well-aerated. Status post bilateral ocular lens implants. The included ocular globes and orbital contents are non-suspicious. OTHER: None. MRA HEAD FINDINGS- moderately motion degraded examination. ANTERIOR CIRCULATION: Flow related enhancement within bilateral internal carotid arteries. However, is at least moderate stenosis bilateral petrous segments. Severe stenosis RIGHT greater than LEFT carotid siphons. Bilateral anterior middle cerebral arteries are patent, limited assessment due to motion. Supernumerary anterior cerebral artery. POSTERIOR CIRCULATION: No demonstrated RIGHT vertebral artery. LEFT vertebral artery is widely patent. Flow related enhancement bilateral posterior inferior cerebellar arteries. Moderate stenosis proximal basilar artery. Robust LEFT posterior communicating artery present. Moderate tandem stenoses RIGHT posterior cerebral artery. Poor, thready flow related enhancement LEFT posterior-inferior cerebellar artery with LEFT P1 segment severe stenosis. ANATOMIC VARIANTS: None. MRA NECK FINDINGS- mild motion degraded examination. ANTERIOR CIRCULATION: Mild stenosis LEFT Common carotid artery origin. Bilateral internal carotid arteries are widely patent. Mild  stenosis LEFT mid cervical internal carotid artery compatible with atherosclerosis. POSTERIOR CIRCULATION: RIGHT vertebral artery is occluded from the origin without significant reconstitution. Moderate luminal irregularity LEFT vertebral artery including the origin. Source images and MIP image were reviewed. IMPRESSION: MRI HEAD: No acute intracranial process on this motion degraded examination. 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 Moderate to severe chronic small vessel ischemic disease. Moderate global parenchymal brain volume loss, advanced for age. MRA HEAD: Motion degraded examination. RIGHT vertebral artery occlusion, likely old. Severe stenoses bilateral internal carotid arteries due to atherosclerosis. Multifocal moderate stenoses posterior circulation compatible with atherosclerosis. MRA NECK: Occluded RIGHT vertebral artery without significant reconstitution. Moderate atherosclerosis LEFT vertebral artery. No hemodynamically significant stenosis of the carotid arteries. Electronically Signed   By: Elon Alas M.D.   On: 03/29/2016 00:04   Mr Cervical Spine Wo Contrast  Result Date: 03/29/2016 CLINICAL DATA:  61 y/o M; increasing numbness in the right upper and lower extremities. EXAM: MRI CERVICAL SPINE WITHOUT CONTRAST TECHNIQUE: Multiplanar, multisequence MR imaging of the cervical spine was performed. No intravenous contrast was administered. COMPARISON:  03/28/2016 MRI of the head. FINDINGS: Alignment: Straightening of cervical lordosis.  No listhesis. Vertebrae: No fracture, evidence of discitis, or bone lesion. Cord: No abnormal cord signal. Posterior Fossa, vertebral arteries, paraspinal tissues: Pontine and cerebellar chronic lacunar infarcts better seen on prior MR. Disc levels: C2-3: No significant disc displacement, foraminal narrowing, or canal stenosis. C3-4: Right central disc protrusion with anterior cord impingement and flattening. No significant foraminal  narrowing. Mild-to-moderate canal stenosis. C4-5: Right foraminal small disc protrusion with uncovertebral and facet hypertrophy. Mild right foraminal narrowing. Mild canal stenosis with slight anterior cord flattening. C5-6: Small disc osteophyte complex and right greater than left uncovertebral and facet hypertrophy. Mild left and moderate right foraminal narrowing. Mild canal stenosis. Slight anterior cord flattening. C6-7: No significant disc displacement, foraminal narrowing, or canal stenosis. C7-T1:  Small disc bulge with no significant foraminal narrowing or canal stenosis. IMPRESSION: 1. No acute osseous abnormality or abnormal cord signal identified. 2. Cervical spondylosis discogenic and facet degenerative changes greatest at the C3-4 level. 3. At C3-4 a right central disc protrusion mildly impinges on the anterior cord with cord flattening and mild-to-moderate canal stenosis. 4. Multiple levels of mild foraminal narrowing and moderate foraminal narrowing at the right C5-6 level. Electronically Signed   By: Kristine Garbe M.D.   On: 03/29/2016 21:31   Dg Chest Port 1 View  Result Date: 03/29/2016 CLINICAL DATA:  Diabetes.  CHF. EXAM: PORTABLE CHEST 1 VIEW COMPARISON:  09/13/2010 . FINDINGS: Cardiomegaly with mild interstitial prominence. Mild component congestive heart failure cannot be excluded. Low lung volumes. Atelectasis. No pleural effusion or pneumothorax. IMPRESSION: 1. Cardiomegaly with very mild interstitial prominence noted bilaterally. Mild CHF cannot be excluded . 2.  Low lung volumes.  Basilar atelectasis. Electronically Signed   By: Marcello Moores  Register   On: 03/29/2016 07:26   Ct Head Code Stroke W/o Cm  Result Date: 03/28/2016 CLINICAL DATA:  Code stroke.  Right-sided numbness. EXAM: CT HEAD WITHOUT CONTRAST TECHNIQUE: Contiguous axial images were obtained from the base of the skull through the vertex without intravenous contrast. COMPARISON:  09/14/2010 CT head. FINDINGS:  Brain: No evidence of acute infarction, hemorrhage, hydrocephalus, extra-axial collection or mass lesion/mass effect. Nonspecific foci of hypoattenuation in subcortical and periventricular white matter are stable and compatible with moderate chronic microvascular ischemic changes. Mild brain parenchymal volume loss. Chronic left pontine lacunar infarct. Small hypodense focus in left caudate head and left lentiform nucleus probably represent chronic lacunar infarcts. Stable mild brain parenchymal volume loss. Vascular: Calcific atherosclerosis of skullbase internal carotid artery is and vertebral arteries. No hyperdense vessel. Skull: Normal. Negative for fracture or focal lesion. Sinuses/Orbits: No acute finding. Other: None. ASPECTS [facility] Stroke Program Early CT Score) - Ganglionic level infarction (caudate, lentiform nuclei, internal capsule, insula, M1-M3 cortex): 7 - Supraganglionic infarction (M4-M6 cortex): 3 Total score (0-10 with 10 being normal): 10 IMPRESSION: 1. No acute intracranial abnormality identified. 2. ASPECTS is 10 3. Stable moderate chronic microvascular ischemic changes of the brain, chronic left basal ganglia and pontine lacunar infarcts, and mild brain parenchymal volume loss. These results were called by telephone at the time of interpretation on 03/28/2016 at 9:38 pm to Dr. Brantley Stage , who verbally acknowledged these results. Electronically Signed   By: Kristine Garbe M.D.   On: 03/28/2016 21:40    Cardiac Studies   ECHO:   Left ventricle: The cavity size was mildly dilated. Wall   thickness was increased in a pattern of mild LVH. Systolic   function was severely reduced. The estimated ejection fraction   was in the range of 25% to 30%. Diffuse hypokinesis. There is   akinesis of the inferolateral myocardium. Doppler parameters are   consistent with abnormal left ventricular relaxation (grade 1   diastolic dysfunction). Doppler parameters are consistent with   high  ventricular filling pressure. - Left atrium: The atrium was mildly dilated. - Right ventricle: Systolic function was moderately reduced.  Patient Profile     [name] is a 61 year old male with a past medical history of chronic systolic CHF, ischemia cardiomyopathy,DM, DVT (on Xarelto), HTN, and HLD. He was admitted numbness of the right upper and lower extremity, found to have a right vertebral artery occlusion, also with elevated troponin and reduced EF and cardiology was consulted.   Assessment & Plan  CARDIOMYOPATHY:  Seems to be euvolemic.  Added Imdur to his previous meds.    Plan out patient study (possibly perfusion) to risk stratify after he has recovered further from his acute neurologic events.   HTN:   BP is improved.  My plan would be to wean off of NTG overtime and add increased Imdur and ARNI  Signed, Minus Breeding, MD  03/30/2016, 7:12 AM

## 2016-03-30 NOTE — Progress Notes (Signed)
Inpatient Diabetes Program Recommendations  AACE/ADA: New Consensus Statement on Inpatient Glycemic Control (2015)  Target Ranges:  Prepandial:   less than 140 mg/dL      Peak postprandial:   less than 180 mg/dL (1-2 hours)      Critically ill patients:  140 - 180 mg/dL   Lab Results  Component Value Date   GLUCAP 201 (H) 03/30/2016   HGBA1C 11.0 (H) 03/29/2016    Review of Glycemic Control:  Results for [name], [name] (MRN :[id number]) as of 03/30/2016 13:36  Ref. Range 03/29/2016 18:29 03/29/2016 21:29 03/30/2016 01:51 03/30/2016 06:26 03/30/2016 11:42  Glucose-Capillary Latest Ref Range: 65 - 99 mg/dL 232 (H) 271 (H) 238 (H) 200 (H) 201 (H)   Diabetes history: Type 2 diabetes Outpatient Diabetes medications: 70/30 52 units AM and 32 units PM, Metformin 1000 mg bid  Current orders for Inpatient glycemic control:  Novolog sensitive tid with meals 70/30- 52 units AM and 32 units PM  Inpatient Diabetes Program Recommendations:    Note home dose of 70/30 resumed.  A1C indicates poorly controlled diabetes.  Will discuss A1C with patient.  Thanks, Adah Perl, RN, BC-ADM Inpatient Diabetes Coordinator Pager [phone] (8a-5p)

## 2016-03-30 NOTE — Progress Notes (Signed)
Occupational Therapy Evaluation Patient Details Name: [name] MRN: [id number] DOB: 01-07-1956 Today's Date: 03/30/2016    History of Present Illness  DENON SCHWABE is a 61 y.o. male with history of diabetes mellitus type 2, hypertension, CHF, DVT presents to ER because of increasing numbness of the right upper and lower extremity. MRI brain showed no acute stroke. MRA of head and neck showed right vertebral artery occlusion.   Clinical Impression   PTA, pt independent with ADL and mobility and lived alone. Pt presents with significant functional impairment as noted below. Pt unable to safely ambulate this pm due to inability to safely advance R leg without falling toward R using RW. Pt requires mod  assistance with ADL. Discussed with nsg regarding deficits this pm as compared to this am. Pt is not safe to DC home and will benefit from extensive rehab at CIR. Will follow acutely to maximize functional level of independence and facilitate safe DC to next venue of care.     Follow Up Recommendations  CIR;Supervision/Assistance - 24 hour    Equipment Recommendations  3 in 1 bedside commode;Tub/shower bench    Recommendations for Other Services Rehab consult     Precautions / Restrictions Precautions Precautions: Fall Restrictions Weight Bearing Restrictions: No      Mobility Bed Mobility Overal bed mobility: Needs Assistance Bed Mobility: Supine to Sit;Sit to Supine     Supine to sit: Supervision;HOB elevated Sit to supine: Supervision;HOB elevated   General bed mobility comments: increased time  Transfers Overall transfer level: Needs assistance Equipment used: Rolling walker (2 wheeled) Transfers: Sit to/from [facility] Sit to Stand: Min assist Stand pivot transfers: Mod assist       General transfer comment: cues for hand placement.  Falling toward R  Unsafe to ambulate due to ataxic gait pattern and falling toward R. Therapist prevented fall  to floor.     Balance Overall balance assessment: Needs assistance           Standing balance-Leahy Scale: Poor Standing balance comment: falling toward R                            ADL Overall ADL's : Needs assistance/impaired     Grooming: Oral care;Set up;Supervision/safety Grooming Details (indicate cue type and reason): difficulty with manipulating objects Upper Body Bathing: Minimal assistance   Lower Body Bathing: Maximal assistance;Sit to/from stand   Upper Body Dressing : Moderate assistance   Lower Body Dressing: Maximal assistance;Sit to/from stand   Toilet Transfer: Moderate assistance;+2 for physical assistance   Toileting- Clothing Manipulation and Hygiene: Moderate assistance       Functional mobility during ADLs: Moderate assistance;+2 for physical assistance       Vision Additional Comments: Blind L eye. Poor vision R eye   Perception     Praxis      Pertinent Vitals/Pain Pain Assessment: No/denies pain     Hand Dominance Right   Extremity/Trunk Assessment Upper Extremity Assessment Upper Extremity Assessment: RUE deficits/detail RUE Deficits / Details: ataxia; poor in hand manipulation skills; numbness throughout RUE,, greater in R hand RUE Sensation: decreased light touch;decreased proprioception RUE Coordination: decreased fine motor;decreased gross motor   Lower Extremity Assessment Lower Extremity Assessment: Defer to PT evaluation RLE Deficits / Details: hip flex 4/5, quads 5/5. major complaint is numbness RLE Sensation: decreased light touch   Cervical / Trunk Assessment Cervical / Trunk Assessment: Other exceptions (R bias)  Cervical / Trunk Exceptions: leaning R in sitting   Communication Communication Communication: No difficulties   Cognition Arousal/Alertness: Awake/alert Behavior During Therapy: WFL for tasks assessed/performed Overall Cognitive Status: Within Functional Limits for tasks assessed                      General Comments       Exercises       Shoulder Instructions      Home Living Family/patient expects to be discharged to:: Private residence Living Arrangements: Alone Available Help at Discharge: Family;Available PRN/intermittently Type of Home: Apartment Home Access: Stairs to enter Entrance Stairs-Number of Steps: 18 steps "have to go up then down to get into my apartment" Can enter through sliding door without any steps, but someone has to be with him to go into apartment and unlock slilding patio door. Entrance Stairs-Rails: Right;Left Home Layout: One level     Bathroom Shower/Tub: Tub/shower unit;Curtain Shower/tub characteristics: Architectural technologist: Handicapped height Bathroom Accessibility: Yes How Accessible: Accessible via walker Home Equipment: None          Prior Functioning/Environment Level of Independence: Independent        Comments: doesn't drive, pt with hx of decreased vision L eye        OT Problem List: Decreased strength;Impaired balance (sitting and/or standing);Impaired vision/perception;Decreased coordination;Decreased safety awareness;Decreased knowledge of use of DME or AE;Decreased knowledge of precautions;Impaired sensation;Impaired tone;Obesity;Impaired UE functional use   OT Treatment/Interventions: Self-care/ADL training;Therapeutic exercise;Neuromuscular education;DME and/or AE instruction;Therapeutic activities;Patient/family education;Balance training    OT Goals(Current goals can be found in the care plan section) Acute Rehab OT Goals Patient Stated Goal: return to his independence OT Goal Formulation: With patient Time For Goal Achievement: 04/13/16 Potential to Achieve Goals: Good ADL Goals Pt Will Perform Grooming: (P) with set-up;sitting Pt Will Perform Upper Body Bathing: (P) with set-up;sitting Pt Will Perform Lower Body Bathing: (P) with min assist;sit to/from stand Pt Will Transfer to Toilet:  (P) with min assist;bedside commode;ambulating Pt Will Perform Toileting - Clothing Manipulation and hygiene: (P) with supervision;sit to/from stand  OT Frequency: Min 3X/week   Barriers to D/C:            Co-evaluation              End of Session Equipment Utilized During Treatment: Gait belt;Rolling walker Nurse Communication: Mobility status  Activity Tolerance: Patient tolerated treatment well Patient left: in bed;with call bell/phone within reach;with bed alarm set   Time: [id number] OT Time Calculation (min): 28 min Charges:  OT General Charges $OT Visit: 1 Procedure OT Evaluation $OT Eval Moderate Complexity: 1 Procedure OT Treatments $Self Care/Home Management : 8-22 mins G-Codes: OT G-codes **NOT FOR INPATIENT CLASS** Functional Assessment Tool Used: clinical judgement Functional Limitation: Self care Self Care Current Status [id number]): At least 60 percent but less than 80 percent impaired, limited or restricted Self Care Goal Status [id number]): At least 1 percent but less than 20 percent impaired, limited or restricted  [facility] 03/30/2016, 4:51 PM   [facility], OT/L  [phone] 03/30/2016

## 2016-03-30 NOTE — Progress Notes (Signed)
Pt demonstrated ability to draw up insulin without difficulty.  Will continue to monitor.  Cori Razor, RN

## 2016-03-30 NOTE — Progress Notes (Signed)
Results for EVER, COLOMBE (MRN :[id number]) as of 03/30/2016 15:35  Ref. Range 03/29/2016 05:31  Hemoglobin A1C Latest Ref Range: 4.8 - 5.6 % 11.0 (H)   Spoke with patient regarding elevated A1C.  He states that he does not check blood sugars due to being out of strips.  He has meter that speaks to him and cannot find strips for this meter.  He is unable to tell me the type of meter and states he got it off the TV. He states that he is unable to see meter to check blood sugars.  However patients states that he is able to draw up and administer insulin with syringe.  He admits that he sometimes misses insulin doses and he is not checking blood sugars.  Needs support with diabetes management.  Discussed with RN and asked her to allow patient to draw up and administer insulin to assess his ability to see.  Also discussed importance of glycemic control and diabetes management.  May benefit from home health RN to assist with diabetes management.   Thanks, Adah Perl, RN, BC-ADM Inpatient Diabetes Coordinator Pager [phone] (8a-5p)

## 2016-03-30 NOTE — Care Management Note (Signed)
Case Management Note  Patient Details  Name: [name] MRN: [id number] Date of Birth: 26-Nov-1955  Subjective/Objective:        Patient presented with Right upper and lower extremity numbness. Lives at home alone. CM will follow for discharge needs pending PT/OT evals and physician orders.             Action/Plan:   Expected Discharge Date:   (unknown)               Expected Discharge Plan:     In-House Referral:     Discharge planning Services     Post Acute Care Choice:    Choice offered to:     DME Arranged:    DME Agency:     HH Arranged:    HH Agency:     Status of Service:     If discussed at [employer] of Stay Meetings, dates discussed:    Additional Comments:  Rolm Baptise, RN 03/30/2016, 12:08 PM

## 2016-03-30 NOTE — Evaluation (Signed)
Physical Therapy Evaluation Patient Details Name: [name] MRN: [id number] DOB: 05-Dec-1955 Today's Date: 03/30/2016   History of Present Illness   [name] is a 61 y.o. male with history of diabetes mellitus type 2, hypertension, CHF, DVT presents to ER because of increasing numbness of the right upper and lower extremity. MRI brain showed no acute stroke. MRA of head and neck showed right vertebral artery occlusion.  Clinical Impression  Pt admitted with above diagnosis. Pt currently with functional limitations due to the deficits listed below (see PT Problem List).  Pt will benefit from skilled PT to increase their independence and safety with mobility to allow discharge to the venue listed below.  Pt with decreased coordination and numbness in R LE/UE impacting mobility.  Recommend CIR consult. Pt is motivated to return to his independent PLOF.     Follow Up Recommendations CIR    Equipment Recommendations  Rolling walker with 5" wheels    Recommendations for Other Services       Precautions / Restrictions Precautions Precautions: Fall Restrictions Weight Bearing Restrictions: No      Mobility  Bed Mobility               General bed mobility comments: up in recliner upon arrival  Transfers Overall transfer level: Needs assistance Equipment used: None Transfers: Sit to/from Stand Sit to Stand: Min assist         General transfer comment: cues for hand placement.  MIn A due to posterior tendency.  Ambulation/Gait Ambulation/Gait assistance: Mod assist Ambulation Distance (Feet): 30 Feet Assistive device: Rolling walker (2 wheeled) Gait Pattern/deviations: Decreased step length - right;Narrow base of support;Ataxic     General Gait Details: Pt with decreased R step length and difficulty coordinating RW and steps with narrow BOS.  Posterior tendency and cues for forward weight shift.   Stairs            Wheelchair Mobility    Modified Rankin  (Stroke Patients Only) Modified Rankin (Stroke Patients Only) Pre-Morbid Rankin Score: No symptoms Modified Rankin: Moderately severe disability     Balance Overall balance assessment: Needs assistance           Standing balance-Leahy Scale: Poor Standing balance comment: stood without UE assist with posterior sway. Unable to march in place without UE support                             Pertinent Vitals/Pain Pain Assessment: No/denies pain (c/o numbness)    Home Living Family/patient expects to be discharged to:: Private residence Living Arrangements: Alone Available Help at Discharge: Family;Available PRN/intermittently Type of Home: Apartment Home Access: Stairs to enter Entrance Stairs-Rails: Right;Left Entrance Stairs-Number of Steps: 18 steps "have to go up then down to get into my apartment" Can enter through sliding door without any steps, but someone has to be with him to go into apartment and unlock slilding patio door. Home Layout: One level Home Equipment: None      Prior Function Level of Independence: Independent         Comments: doesn't drive, pt with hx of decreased vision L eye     Hand Dominance        Extremity/Trunk Assessment   Upper Extremity Assessment Upper Extremity Assessment: Defer to OT evaluation    Lower Extremity Assessment Lower Extremity Assessment: RLE deficits/detail RLE Deficits / Details: hip flex 4/5, quads 5/5. major complaint is numbness RLE  Sensation: decreased light touch       Communication   Communication: No difficulties  Cognition Arousal/Alertness: Awake/alert Behavior During Therapy: WFL for tasks assessed/performed Overall Cognitive Status: Within Functional Limits for tasks assessed                      General Comments      Exercises     Assessment/Plan    PT Assessment Patient needs continued PT services  PT Problem List Decreased strength;Decreased activity  tolerance;Decreased balance;Decreased mobility;Decreased coordination;Decreased safety awareness;Decreased knowledge of use of DME;Impaired sensation          PT Treatment Interventions DME instruction;Gait training;Functional mobility training;Therapeutic activities;Therapeutic exercise    PT Goals (Current goals can be found in the Care Plan section)  Acute Rehab PT Goals Patient Stated Goal: return to his independence PT Goal Formulation: With patient Time For Goal Achievement: 04/13/16 Potential to Achieve Goals: Good    Frequency Min 3X/week   Barriers to discharge Inaccessible home environment      Co-evaluation               End of Session Equipment Utilized During Treatment: Gait belt Activity Tolerance: Patient tolerated treatment well Patient left: in chair;with call bell/phone within reach;with chair alarm set;with family/visitor present Nurse Communication: Other (comment);Mobility status (spoke with neuro PA at end of session)    Functional Assessment Tool Used: clinical judgement and objective findings Functional Limitation: Mobility: Walking and moving around Mobility: Walking and Moving Around Current Status [id number]): At least 20 percent but less than 40 percent impaired, limited or restricted Mobility: Walking and Moving Around Goal Status [id number]): At least 1 percent but less than 20 percent impaired, limited or restricted    Time: 0847-0919 PT Time Calculation (min) (ACUTE ONLY): 32 min   Charges:   PT Evaluation $PT Eval Moderate Complexity: 1 Procedure PT Treatments $Gait Training: 8-22 mins   PT G Codes:   PT G-Codes **NOT FOR INPATIENT CLASS** Functional Assessment Tool Used: clinical judgement and objective findings Functional Limitation: Mobility: Walking and moving around Mobility: Walking and Moving Around Current Status [id number]): At least 20 percent but less than 40 percent impaired, limited or restricted Mobility: Walking and Moving  Around Goal Status [phone]): At least 1 percent but less than 20 percent impaired, limited or restricted    [facility] [name] 03/30/2016, 9:39 AM

## 2016-03-30 NOTE — Progress Notes (Signed)
PROGRESS NOTE    James SEBESTYEN  [id number] DOB: 19-Nov-1955 DOA: 03/28/2016 PCP: [name], MD    Brief Narrative:  61 y.o.malewith history of diabetes mellitus type 2, hypertension, CHF, DVT presents to ER because of increasing numbness of the right upper and lower extremity. Patient's symptoms started around 7 PM last evening. Denies any associated weakness of extremities or any difficulty swallowing or speaking or visual symptoms. CT of the head was unremarkable. On-call neurologist Dr. Jacqulyn Liner consulted and requested patient be transferred to [facility]. MRI of the brain and MRA of the head and neck was showing no acute infarct but did show occlusion of the right vertebral artery. On my exam patient still complains of right upper and lower extremity numbness but no obvious weakness. Patient's blood pressure is also markedly elevated on presentation which improved without any intervention.   Assessment & Plan:   Principal Problem:   TIA (transient ischemic attack) Active Problems:   Diabetes type 2, uncontrolled (HCC)   Cardiomyopathy ([place])   DVT (deep venous thrombosis) (HCC)   Numbness on right side   Hypertensive urgency  1. TIA  1. Case was discussed with Dr. Cheral Marker prior to transfer to [facility] 2. Repeat MRI recommended, pending 3. Continued on xarelto 2. Hypertensive urgency 1. Continued on clonidine, hydralazine, imdur, cozaar 2. BP stable and controlled. 3. History of DVT  1. Reportedly missed his xarelto dose last 3 days.  2. Xarelto has been restarted. 4. Diabetes mellitus type 2  1. patient is on NovoLog 70/30 twice a day.  2. Glucose remains in the 200's 3. Increase 70/30 insulin  5. History of CHF  1. last EF measured in 2014 was 60-65% with grade 1 diastolic dysfunction - appears compensated. On Lasix. 2. Appears stable at this time  DVT prophylaxis: Xarelto Code Status: Full Family Communication: Pt in room, family not at  bedside Disposition Plan: Uncertain at this time  Consultants:   Neurology  Procedures:     Antimicrobials: Anti-infectives    None       Subjective: No complaints  Objective: Vitals:   03/30/16 0839 03/30/16 0928 03/30/16 1155 03/30/16 1404  BP: 129/72 (!) 130/56 (!) 129/57 (!) 101/48  Pulse: 85 91  67  Resp:  18  18  Temp:  98.5 F (36.9 C)  97.8 F (36.6 C)  TempSrc:  Oral  Oral  SpO2:  97%  97%  Weight:      Height:        Intake/Output Summary (Last 24 hours) at 03/30/16 1640 Last data filed at 03/30/16 1300  Gross per 24 hour  Intake              720 ml  Output              850 ml  Net             -130 ml   Filed Weights   03/28/16 2044  Weight: 111.1 kg (245 lb)    Examination:  General exam: Appears calm and comfortable  Respiratory system: Clear to auscultation. Respiratory effort normal. Cardiovascular system: S1 & S2 heard, RRR. Gastrointestinal system: Abdomen is nondistended, soft and nontender. No organomegaly or masses felt. Normal bowel sounds heard. Central nervous system: Alert and oriented. No focal neurological deficits. Extremities: Symmetric 5 x 5 power. Skin: No rashes, lesions  Psychiatry: Judgement and insight appear normal. Mood & affect appropriate.   Data Reviewed: I have personally reviewed following labs and  imaging studies  CBC:  Recent Labs Lab 03/28/16 2113 03/28/16 2121  WBC 5.9  --   NEUTROABS 2.5  --   HGB 14.6 15.3  HCT 43.3 45.0  MCV 85.6  --   PLT 210  --    Basic Metabolic Panel:  Recent Labs Lab 03/28/16 2113 03/28/16 2121  NA 139 143  K 3.3* 3.4*  CL 103 102  CO2 26  --   GLUCOSE 165* 164*  BUN 18 18  CREATININE 1.18 1.30*  CALCIUM 8.9  --    GFR: Estimated Creatinine Clearance: 74.3 mL/min (by C-G formula based on SCr of 1.3 mg/dL (H)). Liver Function Tests:  Recent Labs Lab 03/28/16 2113  AST 24  ALT 17  ALKPHOS 61  BILITOT 0.6  PROT 7.7  ALBUMIN 3.8   No results for  input(s): LIPASE, AMYLASE in the last 168 hours. No results for input(s): AMMONIA in the last 168 hours. Coagulation Profile:  Recent Labs Lab 03/28/16 2113  INR 0.92   Cardiac Enzymes:  Recent Labs Lab 03/29/16 0931  TROPONINI 0.14*   BNP (last 3 results) No results for input(s): PROBNP in the last 8760 hours. HbA1C:  Recent Labs  03/29/16 0531  HGBA1C 11.0*   CBG:  Recent Labs Lab 03/29/16 1829 03/29/16 2129 03/30/16 0151 03/30/16 0626 03/30/16 1142  GLUCAP 232* 271* 238* 200* 201*   Lipid Profile:  Recent Labs  03/29/16 0531  CHOL 235*  HDL 36*  LDLCALC 167*  TRIG 160*  CHOLHDL 6.5   Thyroid Function Tests: No results for input(s): TSH, T4TOTAL, FREET4, T3FREE, THYROIDAB in the last 72 hours. Anemia Panel: No results for input(s): VITAMINB12, FOLATE, FERRITIN, TIBC, IRON, RETICCTPCT in the last 72 hours. Sepsis Labs: No results for input(s): PROCALCITON, LATICACIDVEN in the last 168 hours.  No results found for this or any previous visit (from the past 240 hour(s)).   Radiology Studies: Mr Virgel Paling [id number] Contrast  Result Date: 03/29/2016 CLINICAL DATA:  RIGHT hand numbness beginning at 7 p.m., progressive RIGHT leg numbness. History of hypertension, diabetes. EXAM: MRI HEAD WITHOUT CONTRAST MRA HEAD WITHOUT CONTRAST MRA NECK WITHOUT AND WITH CONTRAST TECHNIQUE: Multiplanar, multiecho pulse sequences of the brain and surrounding structures were obtained without intravenous contrast. Angiographic images of the Circle of Willis were obtained using MRA technique without intravenous contrast. Angiographic images of the neck were obtained using MRA technique without and with intravenous contrast. Carotid stenosis measurements (when applicable) are obtained utilizing NASCET criteria, using the distal internal carotid diameter as the denominator. CONTRAST:  13mL MULTIHANCE GADOBENATE DIMEGLUMINE 529 MG/ML IV SOLN COMPARISON:  CT HEAD March 28, 2016 at 2117 hours  FINDINGS: MRI HEAD FINDINGS- many sequences are moderately motion degraded. BRAIN: No reduced diffusion to suggest acute ischemia. No susceptibility artifact to suggest hemorrhage. Moderate ventriculomegaly on the basis of global parenchymal brain volume loss. Confluent supratentorial white matter FLAIR T2 hyperintensities. Multiple old pontine, cerebellar infarcts. Old bilateral thalamus and bilateral basal ganglia lacunar infarcts. No midline shift, mass effect or masses. No abnormal extra-axial fluid collections. VASCULAR: Normal major intracranial vascular flow voids present at skull base. SKULL AND UPPER CERVICAL SPINE: No abnormal sellar expansion. No suspicious calvarial bone marrow signal. Craniocervical junction maintained. SINUSES/ORBITS: The mastoid air-cells and included paranasal sinuses are well-aerated. Status post bilateral ocular lens implants. The included ocular globes and orbital contents are non-suspicious. OTHER: None. MRA HEAD FINDINGS- moderately motion degraded examination. ANTERIOR CIRCULATION: Flow related enhancement within bilateral internal carotid arteries. However, is  at least moderate stenosis bilateral petrous segments. Severe stenosis RIGHT greater than LEFT carotid siphons. Bilateral anterior middle cerebral arteries are patent, limited assessment due to motion. Supernumerary anterior cerebral artery. POSTERIOR CIRCULATION: No demonstrated RIGHT vertebral artery. LEFT vertebral artery is widely patent. Flow related enhancement bilateral posterior inferior cerebellar arteries. Moderate stenosis proximal basilar artery. Robust LEFT posterior communicating artery present. Moderate tandem stenoses RIGHT posterior cerebral artery. Poor, thready flow related enhancement LEFT posterior-inferior cerebellar artery with LEFT P1 segment severe stenosis. ANATOMIC VARIANTS: None. MRA NECK FINDINGS- mild motion degraded examination. ANTERIOR CIRCULATION: Mild stenosis LEFT Common carotid artery  origin. Bilateral internal carotid arteries are widely patent. Mild stenosis LEFT mid cervical internal carotid artery compatible with atherosclerosis. POSTERIOR CIRCULATION: RIGHT vertebral artery is occluded from the origin without significant reconstitution. Moderate luminal irregularity LEFT vertebral artery including the origin. Source images and MIP image were reviewed. IMPRESSION: MRI HEAD: No acute intracranial process on this motion degraded examination. 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 Moderate to severe chronic small vessel ischemic disease. Moderate global parenchymal brain volume loss, advanced for age. MRA HEAD: Motion degraded examination. RIGHT vertebral artery occlusion, likely old. Severe stenoses bilateral internal carotid arteries due to atherosclerosis. Multifocal moderate stenoses posterior circulation compatible with atherosclerosis. MRA NECK: Occluded RIGHT vertebral artery without significant reconstitution. Moderate atherosclerosis LEFT vertebral artery. No hemodynamically significant stenosis of the carotid arteries. Electronically Signed   By: Elon Alas M.D.   On: 03/29/2016 00:04   Mr Angiogram Neck W Or Wo Contrast  Result Date: 03/29/2016 CLINICAL DATA:  RIGHT hand numbness beginning at 7 p.m., progressive RIGHT leg numbness. History of hypertension, diabetes. EXAM: MRI HEAD WITHOUT CONTRAST MRA HEAD WITHOUT CONTRAST MRA NECK WITHOUT AND WITH CONTRAST TECHNIQUE: Multiplanar, multiecho pulse sequences of the brain and surrounding structures were obtained without intravenous contrast. Angiographic images of the Circle of Willis were obtained using MRA technique without intravenous contrast. Angiographic images of the neck were obtained using MRA technique without and with intravenous contrast. Carotid stenosis measurements (when applicable) are obtained utilizing NASCET criteria, using the distal internal carotid diameter as the denominator.  CONTRAST:  67mL MULTIHANCE GADOBENATE DIMEGLUMINE 529 MG/ML IV SOLN COMPARISON:  CT HEAD March 28, 2016 at 2117 hours FINDINGS: MRI HEAD FINDINGS- many sequences are moderately motion degraded. BRAIN: No reduced diffusion to suggest acute ischemia. No susceptibility artifact to suggest hemorrhage. Moderate ventriculomegaly on the basis of global parenchymal brain volume loss. Confluent supratentorial white matter FLAIR T2 hyperintensities. Multiple old pontine, cerebellar infarcts. Old bilateral thalamus and bilateral basal ganglia lacunar infarcts. No midline shift, mass effect or masses. No abnormal extra-axial fluid collections. VASCULAR: Normal major intracranial vascular flow voids present at skull base. SKULL AND UPPER CERVICAL SPINE: No abnormal sellar expansion. No suspicious calvarial bone marrow signal. Craniocervical junction maintained. SINUSES/ORBITS: The mastoid air-cells and included paranasal sinuses are well-aerated. Status post bilateral ocular lens implants. The included ocular globes and orbital contents are non-suspicious. OTHER: None. MRA HEAD FINDINGS- moderately motion degraded examination. ANTERIOR CIRCULATION: Flow related enhancement within bilateral internal carotid arteries. However, is at least moderate stenosis bilateral petrous segments. Severe stenosis RIGHT greater than LEFT carotid siphons. Bilateral anterior middle cerebral arteries are patent, limited assessment due to motion. Supernumerary anterior cerebral artery. POSTERIOR CIRCULATION: No demonstrated RIGHT vertebral artery. LEFT vertebral artery is widely patent. Flow related enhancement bilateral posterior inferior cerebellar arteries. Moderate stenosis proximal basilar artery. Robust LEFT posterior communicating artery present. Moderate tandem stenoses RIGHT posterior cerebral artery. Poor, thready  flow related enhancement LEFT posterior-inferior cerebellar artery with LEFT P1 segment severe stenosis. ANATOMIC VARIANTS:  None. MRA NECK FINDINGS- mild motion degraded examination. ANTERIOR CIRCULATION: Mild stenosis LEFT Common carotid artery origin. Bilateral internal carotid arteries are widely patent. Mild stenosis LEFT mid cervical internal carotid artery compatible with atherosclerosis. POSTERIOR CIRCULATION: RIGHT vertebral artery is occluded from the origin without significant reconstitution. Moderate luminal irregularity LEFT vertebral artery including the origin. Source images and MIP image were reviewed. IMPRESSION: MRI HEAD: No acute intracranial process on this motion degraded examination. 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 Moderate to severe chronic small vessel ischemic disease. Moderate global parenchymal brain volume loss, advanced for age. MRA HEAD: Motion degraded examination. RIGHT vertebral artery occlusion, likely old. Severe stenoses bilateral internal carotid arteries due to atherosclerosis. Multifocal moderate stenoses posterior circulation compatible with atherosclerosis. MRA NECK: Occluded RIGHT vertebral artery without significant reconstitution. Moderate atherosclerosis LEFT vertebral artery. No hemodynamically significant stenosis of the carotid arteries. Electronically Signed   By: Elon Alas M.D.   On: 03/29/2016 00:04   Mr Brain Wo Contrast  Result Date: 03/29/2016 CLINICAL DATA:  RIGHT hand numbness beginning at 7 p.m., progressive RIGHT leg numbness. History of hypertension, diabetes. EXAM: MRI HEAD WITHOUT CONTRAST MRA HEAD WITHOUT CONTRAST MRA NECK WITHOUT AND WITH CONTRAST TECHNIQUE: Multiplanar, multiecho pulse sequences of the brain and surrounding structures were obtained without intravenous contrast. Angiographic images of the Circle of Willis were obtained using MRA technique without intravenous contrast. Angiographic images of the neck were obtained using MRA technique without and with intravenous contrast. Carotid stenosis measurements (when applicable)  are obtained utilizing NASCET criteria, using the distal internal carotid diameter as the denominator. CONTRAST:  49mL MULTIHANCE GADOBENATE DIMEGLUMINE 529 MG/ML IV SOLN COMPARISON:  CT HEAD March 28, 2016 at 2117 hours FINDINGS: MRI HEAD FINDINGS- many sequences are moderately motion degraded. BRAIN: No reduced diffusion to suggest acute ischemia. No susceptibility artifact to suggest hemorrhage. Moderate ventriculomegaly on the basis of global parenchymal brain volume loss. Confluent supratentorial white matter FLAIR T2 hyperintensities. Multiple old pontine, cerebellar infarcts. Old bilateral thalamus and bilateral basal ganglia lacunar infarcts. No midline shift, mass effect or masses. No abnormal extra-axial fluid collections. VASCULAR: Normal major intracranial vascular flow voids present at skull base. SKULL AND UPPER CERVICAL SPINE: No abnormal sellar expansion. No suspicious calvarial bone marrow signal. Craniocervical junction maintained. SINUSES/ORBITS: The mastoid air-cells and included paranasal sinuses are well-aerated. Status post bilateral ocular lens implants. The included ocular globes and orbital contents are non-suspicious. OTHER: None. MRA HEAD FINDINGS- moderately motion degraded examination. ANTERIOR CIRCULATION: Flow related enhancement within bilateral internal carotid arteries. However, is at least moderate stenosis bilateral petrous segments. Severe stenosis RIGHT greater than LEFT carotid siphons. Bilateral anterior middle cerebral arteries are patent, limited assessment due to motion. Supernumerary anterior cerebral artery. POSTERIOR CIRCULATION: No demonstrated RIGHT vertebral artery. LEFT vertebral artery is widely patent. Flow related enhancement bilateral posterior inferior cerebellar arteries. Moderate stenosis proximal basilar artery. Robust LEFT posterior communicating artery present. Moderate tandem stenoses RIGHT posterior cerebral artery. Poor, thready flow related  enhancement LEFT posterior-inferior cerebellar artery with LEFT P1 segment severe stenosis. ANATOMIC VARIANTS: None. MRA NECK FINDINGS- mild motion degraded examination. ANTERIOR CIRCULATION: Mild stenosis LEFT Common carotid artery origin. Bilateral internal carotid arteries are widely patent. Mild stenosis LEFT mid cervical internal carotid artery compatible with atherosclerosis. POSTERIOR CIRCULATION: RIGHT vertebral artery is occluded from the origin without significant reconstitution. Moderate luminal irregularity LEFT vertebral artery including the origin. Source images  and MIP image were reviewed. IMPRESSION: MRI HEAD: No acute intracranial process on this motion degraded examination. 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 Moderate to severe chronic small vessel ischemic disease. Moderate global parenchymal brain volume loss, advanced for age. MRA HEAD: Motion degraded examination. RIGHT vertebral artery occlusion, likely old. Severe stenoses bilateral internal carotid arteries due to atherosclerosis. Multifocal moderate stenoses posterior circulation compatible with atherosclerosis. MRA NECK: Occluded RIGHT vertebral artery without significant reconstitution. Moderate atherosclerosis LEFT vertebral artery. No hemodynamically significant stenosis of the carotid arteries. Electronically Signed   By: Elon Alas M.D.   On: 03/29/2016 00:04   Mr Cervical Spine Wo Contrast  Result Date: 03/29/2016 CLINICAL DATA:  61 y/o M; increasing numbness in the right upper and lower extremities. EXAM: MRI CERVICAL SPINE WITHOUT CONTRAST TECHNIQUE: Multiplanar, multisequence MR imaging of the cervical spine was performed. No intravenous contrast was administered. COMPARISON:  03/28/2016 MRI of the head. FINDINGS: Alignment: Straightening of cervical lordosis.  No listhesis. Vertebrae: No fracture, evidence of discitis, or bone lesion. Cord: No abnormal cord signal. Posterior Fossa, vertebral  arteries, paraspinal tissues: Pontine and cerebellar chronic lacunar infarcts better seen on prior MR. Disc levels: C2-3: No significant disc displacement, foraminal narrowing, or canal stenosis. C3-4: Right central disc protrusion with anterior cord impingement and flattening. No significant foraminal narrowing. Mild-to-moderate canal stenosis. C4-5: Right foraminal small disc protrusion with uncovertebral and facet hypertrophy. Mild right foraminal narrowing. Mild canal stenosis with slight anterior cord flattening. C5-6: Small disc osteophyte complex and right greater than left uncovertebral and facet hypertrophy. Mild left and moderate right foraminal narrowing. Mild canal stenosis. Slight anterior cord flattening. C6-7: No significant disc displacement, foraminal narrowing, or canal stenosis. C7-T1: Small disc bulge with no significant foraminal narrowing or canal stenosis. IMPRESSION: 1. No acute osseous abnormality or abnormal cord signal identified. 2. Cervical spondylosis discogenic and facet degenerative changes greatest at the C3-4 level. 3. At C3-4 a right central disc protrusion mildly impinges on the anterior cord with cord flattening and mild-to-moderate canal stenosis. 4. Multiple levels of mild foraminal narrowing and moderate foraminal narrowing at the right C5-6 level. Electronically Signed   By: Kristine Garbe M.D.   On: 03/29/2016 21:31   Dg Chest Port 1 View  Result Date: 03/29/2016 CLINICAL DATA:  Diabetes.  CHF. EXAM: PORTABLE CHEST 1 VIEW COMPARISON:  09/13/2010 . FINDINGS: Cardiomegaly with mild interstitial prominence. Mild component congestive heart failure cannot be excluded. Low lung volumes. Atelectasis. No pleural effusion or pneumothorax. IMPRESSION: 1. Cardiomegaly with very mild interstitial prominence noted bilaterally. Mild CHF cannot be excluded . 2.  Low lung volumes.  Basilar atelectasis. Electronically Signed   By: Marcello Moores  Register   On: 03/29/2016 07:26   Ct  Head Code Stroke W/o Cm  Result Date: 03/28/2016 CLINICAL DATA:  Code stroke.  Right-sided numbness. EXAM: CT HEAD WITHOUT CONTRAST TECHNIQUE: Contiguous axial images were obtained from the base of the skull through the vertex without intravenous contrast. COMPARISON:  09/14/2010 CT head. FINDINGS: Brain: No evidence of acute infarction, hemorrhage, hydrocephalus, extra-axial collection or mass lesion/mass effect. Nonspecific foci of hypoattenuation in subcortical and periventricular white matter are stable and compatible with moderate chronic microvascular ischemic changes. Mild brain parenchymal volume loss. Chronic left pontine lacunar infarct. Small hypodense focus in left caudate head and left lentiform nucleus probably represent chronic lacunar infarcts. Stable mild brain parenchymal volume loss. Vascular: Calcific atherosclerosis of skullbase internal carotid artery is and vertebral arteries. No hyperdense vessel. Skull: Normal. Negative  for fracture or focal lesion. Sinuses/Orbits: No acute finding. Other: None. ASPECTS [facility] Stroke Program Early CT Score) - Ganglionic level infarction (caudate, lentiform nuclei, internal capsule, insula, M1-M3 cortex): 7 - Supraganglionic infarction (M4-M6 cortex): 3 Total score (0-10 with 10 being normal): 10 IMPRESSION: 1. No acute intracranial abnormality identified. 2. ASPECTS is 10 3. Stable moderate chronic microvascular ischemic changes of the brain, chronic left basal ganglia and pontine lacunar infarcts, and mild brain parenchymal volume loss. These results were called by telephone at the time of interpretation on 03/28/2016 at 9:38 pm to Dr. Brantley Stage , who verbally acknowledged these results. Electronically Signed   By: Kristine Garbe M.D.   On: 03/28/2016 21:40    Scheduled Meds: .  stroke: mapping our early stages of recovery book   Does not apply Once  . carvedilol  25 mg Oral BID WC  . chlorthalidone  25 mg Oral Daily  . cloNIDine  0.2 mg  Oral TID  . furosemide  80 mg Oral Daily  . hydrALAZINE  50 mg Oral Q6H  . insulin aspart  0-9 Units Subcutaneous TID WC  . insulin aspart protamine- aspart  32 Units Subcutaneous Q supper  . insulin aspart protamine- aspart  52 Units Subcutaneous Q breakfast  . isosorbide mononitrate  60 mg Oral Daily  . losartan  100 mg Oral Daily  . rivaroxaban  20 mg Oral Q supper   Continuous Infusions:   LOS: 0 days   Michelle Vanhise, Orpah Melter, MD [facility] Pager [phone]  If 7PM-7AM, please contact night-coverage [email or web address] Password [email or web address] 03/30/2016, 4:40 PM

## 2016-03-30 NOTE — Consult Note (Signed)
NEURO HOSPITALIST CONSULT NOTE   Requestig physician: Dr. Earlie Counts   Reason for Consult:Right-sided numbness and lower extremity weakness along with abnormal gait   History obtained from:  Patient     HPI:                                                                                                                                          [name] is an 61 y.o. male who is on Xarelto for CHF and low EF, blind in his left eye, hypercholesterolemia, hypertension, MI, significant peripheral neuropathy who presents the hospital after 2 day history of sudden onset of right-sided numbness and difficulty with his gait. MRI of the brain showed no acute infarcts however he did show old bilateral thalamic and bilateral basal ganglia lacunar infarcts along with multiple old pontine, cerebellar infarcts. MRI of the cervical spine showed no acute osseous abnormalities. He does have cervical spondylosis with discogenic and facet degenerative changes greatest at C3-4. There is a CT for right central disc protrusion mildly impinging on the anterior cord with cord flattening however there is no mention of any edema.  Patient states that he was doing some neck exercises approximately 2 days ago where he would move his neck around a different rotations approximately 60 times and at that time he suddenly felt some numbness in his face arm and leg on the right side. He also noted that he was having gait difficulty as he feels that he cannot extend and move his right leg as usual. When he is very unsteady on his feet.  Patient currently has significant bilateral peripheral neuropathy in which he has no proprioception, vibratory sensation and has no cold sensation all the way up to his knees. However per daughter who is in the room his gait has never been this significantly abnormal.  Past Medical History:  Diagnosis Date  . Adrenal insufficiency ([place])   . Allergy   . Arthritis   . Blind left  eye   . Carpal tunnel syndrome   . Cataract    bil removed  . CHF (congestive heart failure) ([place])   . Diabetes mellitus without complication ([place])   . ED (erectile dysfunction)   . Gout   . Herpes   . Hypercholesterolemia   . Hypertension   . MI (myocardial infarction)   . Peripheral neuropathy ([place])   . Pneumonia   . PVD (peripheral vascular disease) ([place])     Past Surgical History:  Procedure Laterality Date  . CHOLECYSTECTOMY    . dilatera cataracts removed    . right knee surgary    . stents      Family History  Problem Relation Age of Onset  . Breast cancer Mother   . Hypertension Father  blood clot in leg  . Diabetes Maternal Aunt   . Heart attack Sister   . Colon cancer Neg Hx   . Esophageal cancer Neg Hx   . Rectal cancer Neg Hx   . Stomach cancer Neg Hx       Social History:  reports that he has never smoked. He has never used smokeless tobacco. He reports that he does not drink alcohol or use drugs.  Allergies  Allergen Reactions  . Iohexol     hives  . Ivp Dye [Iodinated Diagnostic Agents]     hives    MEDICATIONS:                                                                                                                     Prior to Admission:  Prescriptions Prior to Admission  Medication Sig Dispense Refill Last Dose  . cloNIDine (CATAPRES) 0.2 MG tablet Take 1 tablet (0.2 mg total) by mouth 3 (three) times daily. 90 tablet 0 03/28/2016 at Unknown time  . furosemide (LASIX) 80 MG tablet Take 80 mg by mouth daily.    03/28/2016 at Unknown time  . insulin NPH-regular (NOVOLIN 70/30) (70-30) 100 UNIT/ML injection Inject into the skin. 52 units in the morning and 32 units in the evening   03/28/2016 at Unknown time  . losartan-hydrochlorothiazide (HYZAAR) 100-12.5 MG per tablet Take 1 tablet by mouth daily.   03/28/2016 at Unknown time  . metFORMIN (GLUCOPHAGE) 1000 MG tablet Take 1,000 mg by mouth 2 (two) times daily with a meal.   03/28/2016 at  Unknown time  . metoprolol tartrate (LOPRESSOR) 25 MG tablet Take 25 mg by mouth 2 (two) times daily.   03/28/2016 at 1400  . rivaroxaban (XARELTO) 20 MG TABS tablet Take 20 mg by mouth daily.   03/26/2016 at Unknown time  . triamcinolone ointment (KENALOG) 0.1 % Apply 1 application topically 2 (two) times daily as needed (rash/irritation on legs).    03/28/2016 at Unknown time  . cephALEXin (KEFLEX) 500 MG capsule Take 1 capsule (500 mg total) by mouth 3 (three) times daily. (Patient not taking: Reported on 07/12/2014) 24 capsule 0 Completed Course at Unknown time  . hydrALAZINE (APRESOLINE) 25 MG tablet Take 1 tablet (25 mg total) by mouth 3 (three) times daily. (Patient not taking: Reported on 03/28/2016) 90 tablet 1 Not Taking at Unknown time  . hydrOXYzine (ATARAX/VISTARIL) 25 MG tablet Take 1 tablet (25 mg total) by mouth 3 (three) times daily as needed for itching (refractory to benadryl). (Patient not taking: Reported on 07/12/2014) 30 tablet 0 Completed Course at Unknown time  . ondansetron (ZOFRAN ODT) 4 MG disintegrating tablet Take 1 tablet (4 mg total) by mouth every 6 (six) hours as needed for nausea or vomiting. (Patient not taking: Reported on 03/28/2016) 12 tablet 0 Not Taking at Unknown time   Scheduled: .  stroke: mapping our early stages of recovery book   Does not apply Once  . carvedilol  25 mg Oral  BID WC  . chlorthalidone  25 mg Oral Daily  . cloNIDine  0.2 mg Oral TID  . furosemide  80 mg Oral Daily  . hydrALAZINE  50 mg Oral Q6H  . insulin aspart  0-9 Units Subcutaneous TID WC  . insulin aspart protamine- aspart  32 Units Subcutaneous Q supper  . insulin aspart protamine- aspart  52 Units Subcutaneous Q breakfast  . isosorbide mononitrate  60 mg Oral Daily  . losartan  100 mg Oral Daily  . rivaroxaban  20 mg Oral Q supper     ROS:                                                                                                                                       History  obtained from the patient  General ROS: negative for - chills, fatigue, fever, night sweats, weight gain or weight loss Psychological ROS: negative for - behavioral disorder, hallucinations, memory difficulties, mood swings or suicidal ideation Ophthalmic ROS: negative for - blurry vision, double vision, eye pain or loss of vision ENT ROS: negative for - epistaxis, nasal discharge, oral lesions, sore throat, tinnitus or vertigo Allergy and Immunology ROS: negative for - hives or itchy/watery eyes Hematological and Lymphatic ROS: negative for - bleeding problems, bruising or swollen lymph nodes Endocrine ROS: negative for - galactorrhea, hair pattern changes, polydipsia/polyuria or temperature intolerance Respiratory ROS: negative for - cough, hemoptysis, shortness of breath or wheezing Cardiovascular ROS: negative for - chest pain, dyspnea on exertion, edema or irregular heartbeat Gastrointestinal ROS: negative for - abdominal pain, diarrhea, hematemesis, nausea/vomiting or stool incontinence Genito-Urinary ROS: negative for - dysuria, hematuria, incontinence or urinary frequency/urgency Musculoskeletal ROS: negative for - joint swelling or muscular weakness Neurological ROS: as noted in HPI Dermatological ROS: negative for rash and skin lesion changes   Blood pressure (!) 130/56, pulse 91, temperature 98.5 F (36.9 C), temperature source Oral, resp. rate 18, height 5\' 9"  (1.753 m), weight 111.1 kg (245 lb), SpO2 97 %.   Neurologic Examination:                                                                                                      HEENT-  Normocephalic, no lesions, without obvious abnormality.  Normal external eye and conjunctiva.  Normal TM's bilaterally.  Normal auditory canals and external ears. Normal external nose, mucus membranes and septum.  Normal pharynx. Cardiovascular- S1, S2 normal, pulses palpable throughout   Lungs- chest clear, no wheezing, rales,  normal symmetric  air entry, Heart exam - S1, S2 normal, no murmur, no gallop, rate regular Abdomen- normal findings: bowel sounds normal Extremities- no edema Lymph-no adenopathy palpable Musculoskeletal-no joint tenderness, deformity or swelling Skin-warm and dry, no hyperpigmentation, vitiligo, or suspicious lesions  Neurological Examination Mental Status: Alert, oriented, thought content appropriate.  Speech fluent without evidence of aphasia.  Able to follow 3 step commands without difficulty. Cranial Nerves: II: ; Visual fields grossly normal, right pupil is 2 mm round and reactive. Left pupil is 3 mm nonreactive and patient is blind in his left eye. III,IV, VI: ptosis not present, extra-ocular motions intact bilaterally V,VII: smile symmetric, facial light touch sensation normal bilaterally VIII: hearing normal bilaterally IX,X: uvula rises symmetrically XI: bilateral shoulder shrug XII: midline tongue extension Motor: Right : Upper extremity   5/5    Left:     Upper extremity   5/5  Lower extremity   5/5     Lower extremity   5/5 I do not note any significant increase in tone throughout his upper or lower extremities Sensory: Pinprick and light touch intact throughout, bilaterally upper extremities however as noted above patient has no proprioception of his lower extremities, no vibratory sensation in his lower extremities and temperature sensation is decreased although it to the knees Deep Tendon Reflexes: 2+ and symmetric throughout bilateral upper extremities with 1+ bilateral knee jerk and no ankle jerk Plantars: Mute bilaterally Cerebellar: normal finger-to-nose on left, some pass pointing on right.  Gait: Very unsteady, tends to leave his right leg stiff and circumduct's, shuffled gait.      Lab Results: Basic Metabolic Panel:  Recent Labs Lab 03/28/16 2113 03/28/16 2121  NA 139 143  K 3.3* 3.4*  CL 103 102  CO2 26  --   GLUCOSE 165* 164*  BUN 18 18  CREATININE 1.18 1.30*   CALCIUM 8.9  --     Liver Function Tests:  Recent Labs Lab 03/28/16 2113  AST 24  ALT 17  ALKPHOS 61  BILITOT 0.6  PROT 7.7  ALBUMIN 3.8   No results for input(s): LIPASE, AMYLASE in the last 168 hours. No results for input(s): AMMONIA in the last 168 hours.  CBC:  Recent Labs Lab 03/28/16 2113 03/28/16 2121  WBC 5.9  --   NEUTROABS 2.5  --   HGB 14.6 15.3  HCT 43.3 45.0  MCV 85.6  --   PLT 210  --     Cardiac Enzymes:  Recent Labs Lab 03/29/16 0931  TROPONINI 0.14*    Lipid Panel:  Recent Labs Lab 03/29/16 0531  CHOL 235*  TRIG 160*  HDL 36*  CHOLHDL 6.5  VLDL 32  LDLCALC 167*    CBG:  Recent Labs Lab 03/29/16 1509 03/29/16 1829 03/29/16 2129 03/30/16 0151 03/30/16 0626  GLUCAP 194* 232* 271* 238* 200*    Microbiology: Results for orders placed or performed during the hospital encounter of 09/04/13  Culture, blood (routine x 2)     Status: None   Collection Time: 09/04/13 11:20 PM  Result Value Ref Range Status   Specimen Description BLOOD LEFT ASSIST CONTROL  Final   Special Requests   Final    BOTTLES DRAWN AEROBIC AND ANAEROBIC 5CC BOTH BOTTLES   Culture  Setup Time   Final    09/05/2013 16:55 Performed at [place]   Final    NO GROWTH 5 DAYS Performed at [facility]   Report Status 09/11/2013  FINAL  Final  Urine culture     Status: None   Collection Time: 09/05/13  2:00 AM  Result Value Ref Range Status   Specimen Description URINE, CLEAN CATCH  Final   Special Requests NONE  Final   Culture  Setup Time   Final    09/05/2013 15:12 Performed at [place]   Final    45,000 COLONIES/ML Performed at [facility]   Culture   Final    Multiple bacterial morphotypes present, none predominant. Suggest appropriate recollection if clinically indicated. Performed at [facility]   Report Status 09/06/2013 FINAL  Final  Culture, blood (routine x 2)      Status: None   Collection Time: 09/05/13  3:27 AM  Result Value Ref Range Status   Specimen Description BLOOD LEFT ANTECUBITAL  Final   Special Requests BOTTLES DRAWN AEROBIC AND ANAEROBIC [facility] EACH  Final   Culture  Setup Time   Final    09/05/2013 12:07 Performed at [facility]   Culture   Final    GROUP B STREP(S.AGALACTIAE)ISOLATED Note: Gram Stain Report Called to,Read Back By and Verified With: PANDORA JACKSON 09/05/13 @ 9:34PM BY RUSCOE A. Performed at [facility]   Report Status 09/07/2013 FINAL  Final   Organism ID, Bacteria GROUP B STREP(S.AGALACTIAE)ISOLATED  Final      Susceptibility   Group b strep(s.agalactiae)isolated - MIC*    AMPICILLIN <=0.25 SENSITIVE Sensitive     PENICILLIN <=0.06 SENSITIVE Sensitive     CLINDAMYCIN <=0.25 SENSITIVE Sensitive     ERYTHROMYCIN <=0.12 SENSITIVE Sensitive     VANCOMYCIN 0.5 SENSITIVE Sensitive     LEVOFLOXACIN 1 SENSITIVE Sensitive     * GROUP B STREP(S.AGALACTIAE)ISOLATED  Culture, blood (routine x 2)     Status: None   Collection Time: 09/07/13  4:00 PM  Result Value Ref Range Status   Specimen Description BLOOD RIGHT HAND  Final   Special Requests BOTTLES DRAWN AEROBIC AND ANAEROBIC 10CC  Final   Culture  Setup Time   Final    09/07/2013 20:06 Performed at [facility]   Culture   Final    NO GROWTH 5 DAYS Performed at [facility]   Report Status 09/13/2013 FINAL  Final  Culture, blood (routine x 2)     Status: None   Collection Time: 09/07/13  4:10 PM  Result Value Ref Range Status   Specimen Description BLOOD RIGHT HAND  Final   Special Requests BOTTLES DRAWN AEROBIC AND ANAEROBIC 10CC  Final   Culture  Setup Time   Final    09/07/2013 20:06 Performed at [facility]   Culture   Final    NO GROWTH 5 DAYS Performed at [facility]   Report Status 09/13/2013 FINAL  Final    Coagulation Studies:  Recent Labs  03/28/16 2113  LABPROT 12.3  INR 0.92     Imaging: Mr Jodene Nam Head Wo Contrast  Result Date: 03/29/2016 CLINICAL DATA:  RIGHT hand numbness beginning at 7 p.m., progressive RIGHT leg numbness. History of hypertension, diabetes. EXAM: MRI HEAD WITHOUT CONTRAST MRA HEAD WITHOUT CONTRAST MRA NECK WITHOUT AND WITH CONTRAST TECHNIQUE: Multiplanar, multiecho pulse sequences of the brain and surrounding structures were obtained without intravenous contrast. Angiographic images of the Circle of Willis were obtained using MRA technique without intravenous contrast. Angiographic images of the neck were obtained using MRA technique without and with intravenous contrast. Carotid stenosis measurements (when  applicable) are obtained utilizing NASCET criteria, using the distal internal carotid diameter as the denominator. CONTRAST:  67mL MULTIHANCE GADOBENATE DIMEGLUMINE 529 MG/ML IV SOLN COMPARISON:  CT HEAD March 28, 2016 at 2117 hours FINDINGS: MRI HEAD FINDINGS- many sequences are moderately motion degraded. BRAIN: No reduced diffusion to suggest acute ischemia. No susceptibility artifact to suggest hemorrhage. Moderate ventriculomegaly on the basis of global parenchymal brain volume loss. Confluent supratentorial white matter FLAIR T2 hyperintensities. Multiple old pontine, cerebellar infarcts. Old bilateral thalamus and bilateral basal ganglia lacunar infarcts. No midline shift, mass effect or masses. No abnormal extra-axial fluid collections. VASCULAR: Normal major intracranial vascular flow voids present at skull base. SKULL AND UPPER CERVICAL SPINE: No abnormal sellar expansion. No suspicious calvarial bone marrow signal. Craniocervical junction maintained. SINUSES/ORBITS: The mastoid air-cells and included paranasal sinuses are well-aerated. Status post bilateral ocular lens implants. The included ocular globes and orbital contents are non-suspicious. OTHER: None. MRA HEAD FINDINGS- moderately motion degraded examination. ANTERIOR CIRCULATION: Flow related  enhancement within bilateral internal carotid arteries. However, is at least moderate stenosis bilateral petrous segments. Severe stenosis RIGHT greater than LEFT carotid siphons. Bilateral anterior middle cerebral arteries are patent, limited assessment due to motion. Supernumerary anterior cerebral artery. POSTERIOR CIRCULATION: No demonstrated RIGHT vertebral artery. LEFT vertebral artery is widely patent. Flow related enhancement bilateral posterior inferior cerebellar arteries. Moderate stenosis proximal basilar artery. Robust LEFT posterior communicating artery present. Moderate tandem stenoses RIGHT posterior cerebral artery. Poor, thready flow related enhancement LEFT posterior-inferior cerebellar artery with LEFT P1 segment severe stenosis. ANATOMIC VARIANTS: None. MRA NECK FINDINGS- mild motion degraded examination. ANTERIOR CIRCULATION: Mild stenosis LEFT Common carotid artery origin. Bilateral internal carotid arteries are widely patent. Mild stenosis LEFT mid cervical internal carotid artery compatible with atherosclerosis. POSTERIOR CIRCULATION: RIGHT vertebral artery is occluded from the origin without significant reconstitution. Moderate luminal irregularity LEFT vertebral artery including the origin. Source images and MIP image were reviewed. IMPRESSION: MRI HEAD: No acute intracranial process on this motion degraded examination. 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 Moderate to severe chronic small vessel ischemic disease. Moderate global parenchymal brain volume loss, advanced for age. MRA HEAD: Motion degraded examination. RIGHT vertebral artery occlusion, likely old. Severe stenoses bilateral internal carotid arteries due to atherosclerosis. Multifocal moderate stenoses posterior circulation compatible with atherosclerosis. MRA NECK: Occluded RIGHT vertebral artery without significant reconstitution. Moderate atherosclerosis LEFT vertebral artery. No hemodynamically  significant stenosis of the carotid arteries. Electronically Signed   By: Elon Alas M.D.   On: 03/29/2016 00:04   Mr Angiogram Neck W Or Wo Contrast  Result Date: 03/29/2016 CLINICAL DATA:  RIGHT hand numbness beginning at 7 p.m., progressive RIGHT leg numbness. History of hypertension, diabetes. EXAM: MRI HEAD WITHOUT CONTRAST MRA HEAD WITHOUT CONTRAST MRA NECK WITHOUT AND WITH CONTRAST TECHNIQUE: Multiplanar, multiecho pulse sequences of the brain and surrounding structures were obtained without intravenous contrast. Angiographic images of the Circle of Willis were obtained using MRA technique without intravenous contrast. Angiographic images of the neck were obtained using MRA technique without and with intravenous contrast. Carotid stenosis measurements (when applicable) are obtained utilizing NASCET criteria, using the distal internal carotid diameter as the denominator. CONTRAST:  62mL MULTIHANCE GADOBENATE DIMEGLUMINE 529 MG/ML IV SOLN COMPARISON:  CT HEAD March 28, 2016 at 2117 hours FINDINGS: MRI HEAD FINDINGS- many sequences are moderately motion degraded. BRAIN: No reduced diffusion to suggest acute ischemia. No susceptibility artifact to suggest hemorrhage. Moderate ventriculomegaly on the basis of global parenchymal brain volume loss. Confluent supratentorial  white matter FLAIR T2 hyperintensities. Multiple old pontine, cerebellar infarcts. Old bilateral thalamus and bilateral basal ganglia lacunar infarcts. No midline shift, mass effect or masses. No abnormal extra-axial fluid collections. VASCULAR: Normal major intracranial vascular flow voids present at skull base. SKULL AND UPPER CERVICAL SPINE: No abnormal sellar expansion. No suspicious calvarial bone marrow signal. Craniocervical junction maintained. SINUSES/ORBITS: The mastoid air-cells and included paranasal sinuses are well-aerated. Status post bilateral ocular lens implants. The included ocular globes and orbital contents are  non-suspicious. OTHER: None. MRA HEAD FINDINGS- moderately motion degraded examination. ANTERIOR CIRCULATION: Flow related enhancement within bilateral internal carotid arteries. However, is at least moderate stenosis bilateral petrous segments. Severe stenosis RIGHT greater than LEFT carotid siphons. Bilateral anterior middle cerebral arteries are patent, limited assessment due to motion. Supernumerary anterior cerebral artery. POSTERIOR CIRCULATION: No demonstrated RIGHT vertebral artery. LEFT vertebral artery is widely patent. Flow related enhancement bilateral posterior inferior cerebellar arteries. Moderate stenosis proximal basilar artery. Robust LEFT posterior communicating artery present. Moderate tandem stenoses RIGHT posterior cerebral artery. Poor, thready flow related enhancement LEFT posterior-inferior cerebellar artery with LEFT P1 segment severe stenosis. ANATOMIC VARIANTS: None. MRA NECK FINDINGS- mild motion degraded examination. ANTERIOR CIRCULATION: Mild stenosis LEFT Common carotid artery origin. Bilateral internal carotid arteries are widely patent. Mild stenosis LEFT mid cervical internal carotid artery compatible with atherosclerosis. POSTERIOR CIRCULATION: RIGHT vertebral artery is occluded from the origin without significant reconstitution. Moderate luminal irregularity LEFT vertebral artery including the origin. Source images and MIP image were reviewed. IMPRESSION: MRI HEAD: No acute intracranial process on this motion degraded examination. 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 Moderate to severe chronic small vessel ischemic disease. Moderate global parenchymal brain volume loss, advanced for age. MRA HEAD: Motion degraded examination. RIGHT vertebral artery occlusion, likely old. Severe stenoses bilateral internal carotid arteries due to atherosclerosis. Multifocal moderate stenoses posterior circulation compatible with atherosclerosis. MRA NECK: Occluded RIGHT  vertebral artery without significant reconstitution. Moderate atherosclerosis LEFT vertebral artery. No hemodynamically significant stenosis of the carotid arteries. Electronically Signed   By: Elon Alas M.D.   On: 03/29/2016 00:04   Mr Brain Wo Contrast  Result Date: 03/29/2016 CLINICAL DATA:  RIGHT hand numbness beginning at 7 p.m., progressive RIGHT leg numbness. History of hypertension, diabetes. EXAM: MRI HEAD WITHOUT CONTRAST MRA HEAD WITHOUT CONTRAST MRA NECK WITHOUT AND WITH CONTRAST TECHNIQUE: Multiplanar, multiecho pulse sequences of the brain and surrounding structures were obtained without intravenous contrast. Angiographic images of the Circle of Willis were obtained using MRA technique without intravenous contrast. Angiographic images of the neck were obtained using MRA technique without and with intravenous contrast. Carotid stenosis measurements (when applicable) are obtained utilizing NASCET criteria, using the distal internal carotid diameter as the denominator. CONTRAST:  93mL MULTIHANCE GADOBENATE DIMEGLUMINE 529 MG/ML IV SOLN COMPARISON:  CT HEAD March 28, 2016 at 2117 hours FINDINGS: MRI HEAD FINDINGS- many sequences are moderately motion degraded. BRAIN: No reduced diffusion to suggest acute ischemia. No susceptibility artifact to suggest hemorrhage. Moderate ventriculomegaly on the basis of global parenchymal brain volume loss. Confluent supratentorial white matter FLAIR T2 hyperintensities. Multiple old pontine, cerebellar infarcts. Old bilateral thalamus and bilateral basal ganglia lacunar infarcts. No midline shift, mass effect or masses. No abnormal extra-axial fluid collections. VASCULAR: Normal major intracranial vascular flow voids present at skull base. SKULL AND UPPER CERVICAL SPINE: No abnormal sellar expansion. No suspicious calvarial bone marrow signal. Craniocervical junction maintained. SINUSES/ORBITS: The mastoid air-cells and included paranasal sinuses are  well-aerated. Status post bilateral ocular  lens implants. The included ocular globes and orbital contents are non-suspicious. OTHER: None. MRA HEAD FINDINGS- moderately motion degraded examination. ANTERIOR CIRCULATION: Flow related enhancement within bilateral internal carotid arteries. However, is at least moderate stenosis bilateral petrous segments. Severe stenosis RIGHT greater than LEFT carotid siphons. Bilateral anterior middle cerebral arteries are patent, limited assessment due to motion. Supernumerary anterior cerebral artery. POSTERIOR CIRCULATION: No demonstrated RIGHT vertebral artery. LEFT vertebral artery is widely patent. Flow related enhancement bilateral posterior inferior cerebellar arteries. Moderate stenosis proximal basilar artery. Robust LEFT posterior communicating artery present. Moderate tandem stenoses RIGHT posterior cerebral artery. Poor, thready flow related enhancement LEFT posterior-inferior cerebellar artery with LEFT P1 segment severe stenosis. ANATOMIC VARIANTS: None. MRA NECK FINDINGS- mild motion degraded examination. ANTERIOR CIRCULATION: Mild stenosis LEFT Common carotid artery origin. Bilateral internal carotid arteries are widely patent. Mild stenosis LEFT mid cervical internal carotid artery compatible with atherosclerosis. POSTERIOR CIRCULATION: RIGHT vertebral artery is occluded from the origin without significant reconstitution. Moderate luminal irregularity LEFT vertebral artery including the origin. Source images and MIP image were reviewed. IMPRESSION: MRI HEAD: No acute intracranial process on this motion degraded examination. 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 Moderate to severe chronic small vessel ischemic disease. Moderate global parenchymal brain volume loss, advanced for age. MRA HEAD: Motion degraded examination. RIGHT vertebral artery occlusion, likely old. Severe stenoses bilateral internal carotid arteries due to atherosclerosis.  Multifocal moderate stenoses posterior circulation compatible with atherosclerosis. MRA NECK: Occluded RIGHT vertebral artery without significant reconstitution. Moderate atherosclerosis LEFT vertebral artery. No hemodynamically significant stenosis of the carotid arteries. Electronically Signed   By: Elon Alas M.D.   On: 03/29/2016 00:04   Mr Cervical Spine Wo Contrast  Result Date: 03/29/2016 CLINICAL DATA:  61 y/o M; increasing numbness in the right upper and lower extremities. EXAM: MRI CERVICAL SPINE WITHOUT CONTRAST TECHNIQUE: Multiplanar, multisequence MR imaging of the cervical spine was performed. No intravenous contrast was administered. COMPARISON:  03/28/2016 MRI of the head. FINDINGS: Alignment: Straightening of cervical lordosis.  No listhesis. Vertebrae: No fracture, evidence of discitis, or bone lesion. Cord: No abnormal cord signal. Posterior Fossa, vertebral arteries, paraspinal tissues: Pontine and cerebellar chronic lacunar infarcts better seen on prior MR. Disc levels: C2-3: No significant disc displacement, foraminal narrowing, or canal stenosis. C3-4: Right central disc protrusion with anterior cord impingement and flattening. No significant foraminal narrowing. Mild-to-moderate canal stenosis. C4-5: Right foraminal small disc protrusion with uncovertebral and facet hypertrophy. Mild right foraminal narrowing. Mild canal stenosis with slight anterior cord flattening. C5-6: Small disc osteophyte complex and right greater than left uncovertebral and facet hypertrophy. Mild left and moderate right foraminal narrowing. Mild canal stenosis. Slight anterior cord flattening. C6-7: No significant disc displacement, foraminal narrowing, or canal stenosis. C7-T1: Small disc bulge with no significant foraminal narrowing or canal stenosis. IMPRESSION: 1. No acute osseous abnormality or abnormal cord signal identified. 2. Cervical spondylosis discogenic and facet degenerative changes greatest at  the C3-4 level. 3. At C3-4 a right central disc protrusion mildly impinges on the anterior cord with cord flattening and mild-to-moderate canal stenosis. 4. Multiple levels of mild foraminal narrowing and moderate foraminal narrowing at the right C5-6 level. Electronically Signed   By: Kristine Garbe M.D.   On: 03/29/2016 21:31   Dg Chest Port 1 View  Result Date: 03/29/2016 CLINICAL DATA:  Diabetes.  CHF. EXAM: PORTABLE CHEST 1 VIEW COMPARISON:  09/13/2010 . FINDINGS: Cardiomegaly with mild interstitial prominence. Mild component congestive heart failure cannot be excluded. Low  lung volumes. Atelectasis. No pleural effusion or pneumothorax. IMPRESSION: 1. Cardiomegaly with very mild interstitial prominence noted bilaterally. Mild CHF cannot be excluded . 2.  Low lung volumes.  Basilar atelectasis. Electronically Signed   By: Marcello Moores  Register   On: 03/29/2016 07:26   Ct Head Code Stroke W/o Cm  Result Date: 03/28/2016 CLINICAL DATA:  Code stroke.  Right-sided numbness. EXAM: CT HEAD WITHOUT CONTRAST TECHNIQUE: Contiguous axial images were obtained from the base of the skull through the vertex without intravenous contrast. COMPARISON:  09/14/2010 CT head. FINDINGS: Brain: No evidence of acute infarction, hemorrhage, hydrocephalus, extra-axial collection or mass lesion/mass effect. Nonspecific foci of hypoattenuation in subcortical and periventricular white matter are stable and compatible with moderate chronic microvascular ischemic changes. Mild brain parenchymal volume loss. Chronic left pontine lacunar infarct. Small hypodense focus in left caudate head and left lentiform nucleus probably represent chronic lacunar infarcts. Stable mild brain parenchymal volume loss. Vascular: Calcific atherosclerosis of skullbase internal carotid artery is and vertebral arteries. No hyperdense vessel. Skull: Normal. Negative for fracture or focal lesion. Sinuses/Orbits: No acute finding. Other: None. ASPECTS  [facility] Stroke Program Early CT Score) - Ganglionic level infarction (caudate, lentiform nuclei, internal capsule, insula, M1-M3 cortex): 7 - Supraganglionic infarction (M4-M6 cortex): 3 Total score (0-10 with 10 being normal): 10 IMPRESSION: 1. No acute intracranial abnormality identified. 2. ASPECTS is 10 3. Stable moderate chronic microvascular ischemic changes of the brain, chronic left basal ganglia and pontine lacunar infarcts, and mild brain parenchymal volume loss. These results were called by telephone at the time of interpretation on 03/28/2016 at 9:38 pm to Dr. Brantley Stage , who verbally acknowledged these results. Electronically Signed   By: Kristine Garbe M.D.   On: 03/28/2016 21:40       Assessment and plan per attending neurologist  Etta Quill PA-C [facility] [phone]  03/30/2016, 10:03 AM  I have seen and evaluated the patient. I have reviewed the above note and made appropriate changes.   Assessment/Plan: 61 yo M With perisstent right upper extremity numbness/ dyscoordination. The sudden onset does still make me suspect that acute stroke may be playing a role. One other possibility would be some other physiologic stressor cuasing unmasking of previous deficits(he has multiple old strokes on imaging, but was not aware of them). Either way secondary stroke prevention measure should be used given imaging findings.   1) Statin for LDL > 70 2) anticoagulation as secondary prophylaxis.  3) repeat MRI with thin cuts through cerebellum/brainstem 4) agree with BP control given concern for heart issues and stable neuro exam.  5) PT,OT  Roland Rack, MD [facility] [phone]  If 7pm- 7am, please page neurology on call as listed in [place].

## 2016-03-30 NOTE — Care Management Obs Status (Signed)
MEDICARE OBSERVATION STATUS NOTIFICATION   Patient Details  Name: [name] MRN: [place]:[id number] Date of Birth: 05-23-1955   Medicare Observation Status Notification Given:  Yes    Pollie Friar, RN 03/30/2016, 4:28 PM

## 2016-03-31 ENCOUNTER — Encounter (HOSPITAL_COMMUNITY): Payer: Medicare Other

## 2016-03-31 DIAGNOSIS — I6339 Cerebral infarction due to thrombosis of other cerebral artery: Secondary | ICD-10-CM

## 2016-03-31 LAB — BASIC METABOLIC PANEL
Anion gap: 10 (ref 5–15)
BUN: 16 mg/dL (ref 6–20)
CALCIUM: 9.2 mg/dL (ref 8.9–10.3)
CHLORIDE: 106 mmol/L (ref 101–111)
CO2: 26 mmol/L (ref 22–32)
CREATININE: 1.52 mg/dL — AB (ref 0.61–1.24)
GFR calc Af Amer: 56 mL/min — ABNORMAL LOW (ref >60)
GFR calc non Af Amer: 48 mL/min — ABNORMAL LOW (ref >60)
GLUCOSE: 113 mg/dL — AB (ref 65–99)
Potassium: 3.2 mmol/L — ABNORMAL LOW (ref 3.5–5.1)
Sodium: 142 mmol/L (ref 135–145)

## 2016-03-31 LAB — GLUCOSE, CAPILLARY
GLUCOSE-CAPILLARY: 232 mg/dL — AB (ref 65–99)
GLUCOSE-CAPILLARY: 263 mg/dL — AB (ref 65–99)
Glucose-Capillary: 129 mg/dL — ABNORMAL HIGH (ref 65–99)
Glucose-Capillary: 292 mg/dL — ABNORMAL HIGH (ref 65–99)

## 2016-03-31 MED ORDER — ISOSORB DINITRATE-HYDRALAZINE 20-37.5 MG PO TABS: 1.0000 | ORAL_TABLET | Freq: Three times a day (TID) | ORAL | Status: DC

## 2016-03-31 MED ORDER — ATORVASTATIN CALCIUM 40 MG PO TABS
40.0000 mg | ORAL_TABLET | Freq: Every day | ORAL | Status: DC
  Administered 2016-03-31 – 2016-04-02 (×3): 40 mg via ORAL
  Filled 2016-03-31: qty 4
  Filled 2016-03-31 (×2): qty 1

## 2016-03-31 MED ORDER — ISOSORB DINITRATE-HYDRALAZINE 20-37.5 MG PO TABS
2.0000 | ORAL_TABLET | Freq: Three times a day (TID) | ORAL | Status: DC
  Administered 2016-03-31 – 2016-04-03 (×9): 2 via ORAL
  Filled 2016-03-31 (×13): qty 2

## 2016-03-31 MED ORDER — POTASSIUM CHLORIDE CRYS ER 20 MEQ PO TBCR
60.0000 meq | EXTENDED_RELEASE_TABLET | Freq: Once | ORAL | Status: AC
  Administered 2016-03-31: 60 meq via ORAL
  Filled 2016-03-31: qty 3

## 2016-03-31 NOTE — Progress Notes (Signed)
PROGRESS NOTE    [name]  [id number] DOB: 08-01-1955 DOA: 03/28/2016 PCP: [name], MD    Brief Narrative:  61 y.o.malewith history of diabetes mellitus type 2, hypertension, CHF, DVT presents to ER because of increasing numbness of the right upper and lower extremity. Patient's symptoms started around 7 PM last evening. Denies any associated weakness of extremities or any difficulty swallowing or speaking or visual symptoms. CT of the head was unremarkable. On-call neurologist Dr. Jacqulyn Liner consulted and requested patient be transferred to [facility]. MRI of the brain and MRA of the head and neck was showing no acute infarct but did show occlusion of the right vertebral artery. On my exam patient still complains of right upper and lower extremity numbness but no obvious weakness. Patient's blood pressure is also markedly elevated on presentation which improved without any intervention.   Assessment & Plan:   Principal Problem:   TIA (transient ischemic attack) Active Problems:   Diabetes type 2, uncontrolled (HCC)   Cardiomyopathy ([place])   DVT (deep venous thrombosis) (HCC)   Left sided numbness   Hypertensive urgency   Right arm numbness  1. Acute L Paramedian Pons CVA 1. Case was discussed with Dr. Cheral Marker prior to transfer to [facility] 2. Repeat MRI confirming acute 5mm L paramedian pons infarct 3. Continued on xarelto 2. Hypertensive urgency 1. Continued on clonidine, hydralazine, cozaar 2. Imdur changed to bidil 3. BP currently stable and controlled. 3. History of DVT  1. Reportedly missed his xarelto dose last 3 days.  2. Xarelto continued 4. Diabetes mellitus type 2  1. patient is on NovoLog 70/30 twice a day.  2. Glucose remains in the 200's, during the day. Appears to be better controlled in the evening 3. Increase AM dose of 70/30 insulin  5. History of CHF  1. last EF measured in 2014 was 60-65% with grade 1 diastolic dysfunction -  appears compensated. On Lasix. 2. Repeat 2d echo results reviewed with EF of 25-30%. 3. Cardiology following, with recs for perfusion study as outpatient 4. Appears stable at this time  DVT prophylaxis: Xarelto Code Status: Full Family Communication: Pt in room, family not at bedside Disposition Plan: Uncertain at this time  Consultants:   Neurology  Cardiology  Procedures:     Antimicrobials: Anti-infectives    None      Subjective: Without complaints  Objective: Vitals:   03/31/16 0211 03/31/16 0534 03/31/16 0909 03/31/16 1401  BP: (!) 136/54 (!) 150/55 (!) 141/47 (!) 135/56  Pulse: 64 66 72 73  Resp: 20   20  Temp: 98.5 F (36.9 C) 98.4 F (36.9 C) 98 F (36.7 C) 98.6 F (37 C)  TempSrc: Oral Oral Oral Oral  SpO2: 94% 96% 95% 96%  Weight:      Height:        Intake/Output Summary (Last 24 hours) at 03/31/16 1548 Last data filed at 03/31/16 0410  Gross per 24 hour  Intake              360 ml  Output              700 ml  Net             -340 ml   Filed Weights   03/28/16 2044  Weight: 111.1 kg (245 lb)    Examination:  General exam: Laying in bed, in nad Respiratory system: normal chest rise, no audible wheezing. Cardiovascular system: regular rate, s1-2 Gastrointestinal system: soft, nondistended,  pos BS Central nervous system: no seizures, no tremors. Extremities: perfused, no clubbing Skin: Normal skin turgor, no notable skin lesions seen Psychiatry: mood normal// no visual hallucinations .   Data Reviewed: I have personally reviewed following labs and imaging studies  CBC:  Recent Labs Lab 03/28/16 2113 03/28/16 2121  WBC 5.9  --   NEUTROABS 2.5  --   HGB 14.6 15.3  HCT 43.3 45.0  MCV 85.6  --   PLT 210  --    Basic Metabolic Panel:  Recent Labs Lab 03/28/16 2113 03/28/16 2121 03/31/16 0329  NA 139 143 142  K 3.3* 3.4* 3.2*  CL 103 102 106  CO2 26  --  26  GLUCOSE 165* 164* 113*  BUN 18 18 16   CREATININE 1.18 1.30*  1.52*  CALCIUM 8.9  --  9.2   GFR: Estimated Creatinine Clearance: 63.5 mL/min (by C-G formula based on SCr of 1.52 mg/dL (H)). Liver Function Tests:  Recent Labs Lab 03/28/16 2113  AST 24  ALT 17  ALKPHOS 61  BILITOT 0.6  PROT 7.7  ALBUMIN 3.8   No results for input(s): LIPASE, AMYLASE in the last 168 hours. No results for input(s): AMMONIA in the last 168 hours. Coagulation Profile:  Recent Labs Lab 03/28/16 2113  INR 0.92   Cardiac Enzymes:  Recent Labs Lab 03/29/16 0931  TROPONINI 0.14*   BNP (last 3 results) No results for input(s): PROBNP in the last 8760 hours. HbA1C:  Recent Labs  03/29/16 0531  HGBA1C 11.0*   CBG:  Recent Labs Lab 03/30/16 1142 03/30/16 1643 03/30/16 2111 03/31/16 0642 03/31/16 1140  GLUCAP 201* 118* 99 129* 232*   Lipid Profile:  Recent Labs  03/29/16 0531  CHOL 235*  HDL 36*  LDLCALC 167*  TRIG 160*  CHOLHDL 6.5   Thyroid Function Tests: No results for input(s): TSH, T4TOTAL, FREET4, T3FREE, THYROIDAB in the last 72 hours. Anemia Panel: No results for input(s): VITAMINB12, FOLATE, FERRITIN, TIBC, IRON, RETICCTPCT in the last 72 hours. Sepsis Labs: No results for input(s): PROCALCITON, LATICACIDVEN in the last 168 hours.  No results found for this or any previous visit (from the past 240 hour(s)).   Radiology Studies: Mr Cervical Spine Wo Contrast  Result Date: 03/29/2016 CLINICAL DATA:  61 y/o M; increasing numbness in the right upper and lower extremities. EXAM: MRI CERVICAL SPINE WITHOUT CONTRAST TECHNIQUE: Multiplanar, multisequence MR imaging of the cervical spine was performed. No intravenous contrast was administered. COMPARISON:  03/28/2016 MRI of the head. FINDINGS: Alignment: Straightening of cervical lordosis.  No listhesis. Vertebrae: No fracture, evidence of discitis, or bone lesion. Cord: No abnormal cord signal. Posterior Fossa, vertebral arteries, paraspinal tissues: Pontine and cerebellar chronic  lacunar infarcts better seen on prior MR. Disc levels: C2-3: No significant disc displacement, foraminal narrowing, or canal stenosis. C3-4: Right central disc protrusion with anterior cord impingement and flattening. No significant foraminal narrowing. Mild-to-moderate canal stenosis. C4-5: Right foraminal small disc protrusion with uncovertebral and facet hypertrophy. Mild right foraminal narrowing. Mild canal stenosis with slight anterior cord flattening. C5-6: Small disc osteophyte complex and right greater than left uncovertebral and facet hypertrophy. Mild left and moderate right foraminal narrowing. Mild canal stenosis. Slight anterior cord flattening. C6-7: No significant disc displacement, foraminal narrowing, or canal stenosis. C7-T1: Small disc bulge with no significant foraminal narrowing or canal stenosis. IMPRESSION: 1. No acute osseous abnormality or abnormal cord signal identified. 2. Cervical spondylosis discogenic and facet degenerative changes greatest at the C3-4 level.  3. At C3-4 a right central disc protrusion mildly impinges on the anterior cord with cord flattening and mild-to-moderate canal stenosis. 4. Multiple levels of mild foraminal narrowing and moderate foraminal narrowing at the right C5-6 level. Electronically Signed   By: Kristine Garbe M.D.   On: 03/29/2016 21:31   Mr Brain Limited Wo Contrast  Result Date: 03/30/2016 CLINICAL DATA:  Initial evaluation for left-sided numbness. EXAM: MRI HEAD WITHOUT CONTRAST TECHNIQUE: Multiplanar, multiecho pulse sequences of the brain and surrounding structures were obtained without intravenous contrast. COMPARISON:  Comparison made with prior MRI from 03/28/2016. FINDINGS: Limited thin-section DWI sequences were performed through the brainstem. Diffusion-weighted imaging demonstrates a small acute ischemic infarct within the left paramedian pons, measuring 9 mm (series 3, image 15). This was not clearly evident on previous MRI. No  other evidence for acute or subacute ischemia on the limited images provided. Adjacent remote lacunar infarcts noted within the brainstem. IMPRESSION: 1. 9 mm acute ischemic infarct within the left paramedian pons. 2. Please note that this is a limited study with only thin section diffusion-weighted imaging performed through the brainstem. Electronically Signed   By: Jeannine Boga M.D.   On: 03/30/2016 23:12    Scheduled Meds: .  stroke: mapping our early stages of recovery book   Does not apply Once  . carvedilol  25 mg Oral BID WC  . chlorthalidone  25 mg Oral Daily  . cloNIDine  0.2 mg Oral TID  . furosemide  80 mg Oral Daily  . insulin aspart  0-9 Units Subcutaneous TID WC  . insulin aspart protamine- aspart  35 Units Subcutaneous Q supper  . insulin aspart protamine- aspart  55 Units Subcutaneous Q breakfast  . isosorbide-hydrALAZINE  2 tablet Oral TID  . losartan  100 mg Oral Daily  . rivaroxaban  20 mg Oral Q supper   Continuous Infusions:   LOS: 1 day   [name], Orpah Melter, MD [facility] Pager [phone]  If 7PM-7AM, please contact night-coverage [email or web address] Password [facility] 03/31/2016, 3:48 PM

## 2016-03-31 NOTE — Progress Notes (Signed)
STROKE TEAM PROGRESS NOTE   HISTORY OF PRESENT ILLNESS (per record) [name] is an 61 y.o. male who is on Xarelto for CHF and low EF, blind in his left eye, hypercholesterolemia, hypertension, MI, significant peripheral neuropathy who presents the hospital after 2 day history of sudden onset of right-sided numbness and difficulty with his gait. MRI of the brain showed no acute infarcts however he did show old bilateral thalamic and bilateral basal ganglia lacunar infarcts along with multiple old pontine, cerebellar infarcts. MRI of the cervical spine showed no acute osseous abnormalities. He does have cervical spondylosis with discogenic and facet degenerative changes greatest at C3-4. There is a CT for right central disc protrusion mildly impinging on the anterior cord with cord flattening however there is no mention of any edema.  Patient states that he was doing some neck exercises approximately 2 days ago where he would move his neck around a different rotations approximately 60 times and at that time he suddenly felt some numbness in his face arm and leg on the right side. He also noted that he was having gait difficulty as he feels that he cannot extend and move his right leg as usual. When he is very unsteady on his feet.  Patient currently has significant bilateral peripheral neuropathy in which he has no proprioception, vibratory sensation and has no cold sensation all the way up to his knees. However per daughter who is in the room his gait has never been this significantly abnormal.   SUBJECTIVE (INTERVAL HISTORY) His children were at the bedside.  Overall he feels his condition is gradually improving. He continue to have marked numbness on his right side which affects his balance and walking.  He reports long time challenges with getting the correct strips for his glucometer and he needs a Case Management consult.  I discussed the resources avail through [employer] [facility].  Family has concerns that his vision does not allow him to see his medications and dose them correctly.  We also discussed his risk factors in detail   OBJECTIVE Temp:  [97.8 F (36.6 C)-98.6 F (37 C)] 98.6 F (37 C) (02/03 1401) Pulse Rate:  [63-73] 73 (02/03 1401) Cardiac Rhythm: Normal sinus rhythm (02/02 2030) Resp:  [18-20] 20 (02/03 1401) BP: (124-150)/(47-56) 135/56 (02/03 1401) SpO2:  [94 %-99 %] 96 % (02/03 1401)  CBC:   Recent Labs Lab 03/28/16 2113 03/28/16 2121  WBC 5.9  --   NEUTROABS 2.5  --   HGB 14.6 15.3  HCT 43.3 45.0  MCV 85.6  --   PLT 210  --     Basic Metabolic Panel:   Recent Labs Lab 03/28/16 2113 03/28/16 2121 03/31/16 0329  NA 139 143 142  K 3.3* 3.4* 3.2*  CL 103 102 106  CO2 26  --  26  GLUCOSE 165* 164* 113*  BUN 18 18 16   CREATININE 1.18 1.30* 1.52*  CALCIUM 8.9  --  9.2    Lipid Panel:     Component Value Date/Time   CHOL 235 (H) 03/29/2016 0531   TRIG 160 (H) 03/29/2016 0531   HDL 36 (L) 03/29/2016 0531   CHOLHDL 6.5 03/29/2016 0531   VLDL 32 03/29/2016 0531   LDLCALC 167 (H) 03/29/2016 0531   HgbA1c:  Lab Results  Component Value Date   HGBA1C 11.0 (H) 03/29/2016   Urine Drug Screen:     Component Value Date/Time   LABOPIA NONE DETECTED 03/29/2016  0227   COCAINSCRNUR NONE DETECTED 03/29/2016 0227   LABBENZ NONE DETECTED 03/29/2016 0227   AMPHETMU NONE DETECTED 03/29/2016 0227   THCU NONE DETECTED 03/29/2016 0227   LABBARB NONE DETECTED 03/29/2016 0227      IMAGING  Mr Cervical Spine Wo Contrast 03/29/2016 1. No acute osseous abnormality or abnormal cord signal identified.  2. Cervical spondylosis discogenic and facet degenerative changes greatest at the C3-4 level.  3. At C3-4 a right central disc protrusion mildly impinges on the anterior cord with cord flattening and mild-to-moderate canal stenosis.  4. Multiple levels of mild foraminal narrowing and moderate foraminal narrowing at the  right C5-6 level.    Mr Brain Limited Wo Contrast 03/30/2016 1. 9 mm acute ischemic infarct within the left paramedian pons.  2. Please note that this is a limited study with only thin section diffusion-weighted imaging performed through the brainstem.    MRI Head Wo Contrast 03/29/2016 No acute intracranial process on this motion degraded examination. 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 Moderate to severe chronic small vessel ischemic disease. Moderate global parenchymal brain volume loss, advanced for age.   MRA Head  03/29/2016 Motion degraded examination.  RIGHT vertebral artery occlusion, likely old. Severe stenoses bilateral internal carotid arteries due to atherosclerosis.  Multifocal moderate stenoses posterior circulation compatible with atherosclerosis.  MRA Neck 03/29/2016 Occluded RIGHT vertebral artery without significant reconstitution. Moderate atherosclerosis LEFT vertebral artery. No hemodynamically significant stenosis of the carotid arteries.  Transthoracic echocardiogram 03/29/2016 Study Conclusions - Left ventricle: The cavity size was mildly dilated. Wall   thickness was increased in a pattern of mild LVH. Systolic   function was severely reduced. The estimated ejection fraction   was in the range of 25% to 30%. Diffuse hypokinesis. There is   akinesis of the inferolateral myocardium. Doppler parameters are   consistent with abnormal left ventricular relaxation (grade 1   diastolic dysfunction). Doppler parameters are consistent with   high ventricular filling pressure. - Left atrium: The atrium was mildly dilated. - Right ventricle: Systolic function was moderately reduced. Impressions: - Global hypokinesis with akinesis of the inferolateral wall;   overall severely reduced LV systolic function; grade 1 diastolic   dysfunction; elevated LV filliing pressure; mild LAE; RV not well   visualized but function appears moderately  reduced.    PHYSICAL EXAM HEENT-  Normocephalic, no lesions, without obvious abnormality.  Normal external eye and conjunctiva.  Normal TM's bilaterally.  Normal auditory canals and external ears. Normal external nose, mucus membranes and septum.  Normal pharynx. Cardiovascular- S1, S2 normal, pulses palpable throughout   Lungs- chest clear, no wheezing, rales, normal symmetric air entry, Heart exam - S1, S2 normal, no murmur, no gallop, rate regular Abdomen- normal findings: bowel sounds normal Extremities- no edema Lymph-no adenopathy palpable Musculoskeletal-no joint tenderness, deformity or swelling Skin-warm and dry, no hyperpigmentation, vitiligo, or suspicious lesions  Neurological Examination Mental Status: Alert, oriented, thought content appropriate.  Speech fluent without evidence of aphasia.  Able to follow 3 step commands without difficulty. Cranial Nerves: II: ; Visual fields grossly normal, right pupil is 2 mm round and reactive. Left pupil is 3 mm nonreactive and patient is blind in his left eye. III,IV, VI: ptosis not present, extra-ocular motions intact bilaterally V,VII: smile symmetric, facial light touch sensation normal bilaterally VIII: hearing normal bilaterally IX,X: uvula rises symmetrically XI: bilateral shoulder shrug XII: midline tongue extension Motor: Right :  Upper extremity   5/5  Left:     Upper extremity   5/5             Lower extremity   5/5                                                  Lower extremity   5/5 I do not note any significant increase in tone throughout his upper or lower extremities Sensory: Pinprick and light touch intact throughout, bilaterally upper extremities however as noted above patient has no proprioception of his lower extremities, no vibratory sensation in his lower extremities and temperature sensation is decreased although it to the knees Deep Tendon Reflexes: 2+ and symmetric  throughout bilateral upper extremities with 1+ bilateral knee jerk and no ankle jerk Plantars: Mute bilaterally Cerebellar: normal finger-to-nose on left, some pass pointing on right.  Gait: Very unsteady, tends to leave his right leg stiff and circumduct's, shuffled gait.     ASSESSMENT/PLAN Mr. DAIVION MANIVONG is a 61 y.o. male with history of peripheral vascular disease, peripheral neuropathy, coronary artery disease with previous MI, hypertension, low EF on Xarelto, hyperlipidemia, diabetes mellitus, congestive heart failure, blind left eye, adrenal insufficiency, and herpes presenting with right-sided numbness and unsteady gait. He did not receive IV t-PA due to late presentation.  Stroke:  Dominant infarct secondary to small vessel disease.  Resultant  Right sided numbness and weakness  MRI - 9 mm acute ischemic infarct within the left paramedian pons.   MRA - RIGHT vertebral artery occlusion, likely old. Severe stenoses bilateral internal carotid arteries.  MRA Neck - No hemodynamically significant stenosis of the carotid arteries.  Carotid Doppler - MRA neck  2D Echo -  EF 25% to 30%. No cardiac source of emboli identified.  LDL - 167  HgbA1c - 11.0  VTE prophylaxis - Xarelto Diet Carb Modified Fluid consistency: Thin; Room service appropriate? Yes  Xarelto (rivaroxaban) daily prior to admission, now on Xarelto (rivaroxaban) daily  Patient counseled to be compliant with his antithrombotic medications  Ongoing aggressive stroke risk factor management  Therapy recommendations:  CIR recommended.  Disposition: Pending  Hypertension  Stable  Permissive hypertension (OK if < 220/120) but gradually normalize in 5-7 days  Long-term BP goal normotensive  Hyperlipidemia  Home meds:  No lipid lowering medications prior to admission  LDL 167, goal < 70  Add Lipitor 40 mg daily  Continue statin at discharge  Diabetes  HgbA1c 11.0, goal <  7.0  Uncontrolled  Other Stroke Risk Factors  Advanced age  Obesity, Body mass index is 36.18 kg/m., recommend weight loss, diet and exercise as appropriate   Hx stroke/TIA  Coronary artery disease   Other Active Problems  Hypokalemia - 3.2  Creatinine - 1.52  Bilateral carotid artery disease   Hospital day # 1  ATTENDING NOTE: Patient was seen and examined by me personally. Documentation reflects findings. The laboratory and radiographic studies reviewed by me. ROS completed by me personally and pertinent positives fully documented Condition:  stable  Assessment and plan completed by me personally and fully documented above. Plans/Recommendations include:     Will have follow-up discussion regarding blood sugar and cholesterol  Would consider carotid u/s to follow-up on MRA given the significant intracranial stenosis noted (perhaps to better visualize neck vessels)  Primary team following potassium, creatinine, glucose  Will need a  Case Management consult for possible SNF; if ultimately disposition includes home, patient will need a lot of support services to manage glucose given his visual impairments  SIGNED BY: Dr. Elissa Hefty    To contact Stroke Continuity provider, please refer to [email or web address] After hours, contact General Neurology

## 2016-03-31 NOTE — Progress Notes (Signed)
Progress Note  Patient Name: [name] Date of Encounter: 03/31/2016  Primary Cardiologist: New  Subjective   No chest pain or SOB.   He does have right sided numbness still.     Inpatient Medications    Scheduled Meds: .  stroke: mapping our early stages of recovery book   Does not apply Once  . carvedilol  25 mg Oral BID WC  . chlorthalidone  25 mg Oral Daily  . cloNIDine  0.2 mg Oral TID  . furosemide  80 mg Oral Daily  . hydrALAZINE  50 mg Oral Q6H  . insulin aspart  0-9 Units Subcutaneous TID WC  . insulin aspart protamine- aspart  35 Units Subcutaneous Q supper  . insulin aspart protamine- aspart  55 Units Subcutaneous Q breakfast  . isosorbide mononitrate  60 mg Oral Daily  . losartan  100 mg Oral Daily  . rivaroxaban  20 mg Oral Q supper   Continuous Infusions:  PRN Meds: acetaminophen **OR** acetaminophen (TYLENOL) oral liquid 160 mg/5 mL **OR** acetaminophen, bismuth subsalicylate   Vital Signs    Vitals:   03/30/16 2139 03/31/16 0211 03/31/16 0534 03/31/16 0909  BP: (!) 126/55 (!) 136/54 (!) 150/55 (!) 141/47  Pulse: 72 64 66 72  Resp: 20 20    Temp: 97.8 F (36.6 C) 98.5 F (36.9 C) 98.4 F (36.9 C) 98 F (36.7 C)  TempSrc: Oral Oral Oral Oral  SpO2: 98% 94% 96% 95%  Weight:      Height:        Intake/Output Summary (Last 24 hours) at 03/31/16 1032 Last data filed at 03/31/16 0410  Gross per 24 hour  Intake              720 ml  Output             1000 ml  Net             -280 ml   Filed Weights   03/28/16 2044  Weight: 245 lb (111.1 kg)    Telemetry    NSR, PVCs, short run of IVR. - Personally Reviewed  ECG    NA - Personally Reviewed  Physical Exam   GEN: No acute distress.   Neck: No JVD Cardiac: RRR, no murmurs, rubs, or gallops.  Respiratory: Clear to auscultation bilaterally. GI: Soft, nontender, non-distended  MS: No edema; No deformity. Neuro:  Nonfocal  Psych: Normal affect   Labs    Chemistry  Recent  Labs Lab 03/28/16 2113 03/28/16 2121 03/31/16 0329  NA 139 143 142  K 3.3* 3.4* 3.2*  CL 103 102 106  CO2 26  --  26  GLUCOSE 165* 164* 113*  BUN 18 18 16   CREATININE 1.18 1.30* 1.52*  CALCIUM 8.9  --  9.2  PROT 7.7  --   --   ALBUMIN 3.8  --   --   AST 24  --   --   ALT 17  --   --   ALKPHOS 61  --   --   BILITOT 0.6  --   --   GFRNONAA >60  --  48*  GFRAA >60  --  56*  ANIONGAP 10  --  10     Hematology  Recent Labs Lab 03/28/16 2113 03/28/16 2121  WBC 5.9  --   RBC 5.06  --   HGB 14.6 15.3  HCT 43.3 45.0  MCV 85.6  --   MCH 28.9  --  MCHC 33.7  --   RDW 12.7  --   PLT 210  --     Cardiac Enzymes  Recent Labs Lab 03/29/16 0931  TROPONINI 0.14*     Recent Labs Lab 03/28/16 2119  TROPIPOC 0.07     BNPNo results for input(s): BNP, PROBNP in the last 168 hours.   DDimer No results for input(s): DDIMER in the last 168 hours.   Radiology    Mr Cervical Spine Wo Contrast  Result Date: 03/29/2016 CLINICAL DATA:  61 y/o M; increasing numbness in the right upper and lower extremities. EXAM: MRI CERVICAL SPINE WITHOUT CONTRAST TECHNIQUE: Multiplanar, multisequence MR imaging of the cervical spine was performed. No intravenous contrast was administered. COMPARISON:  03/28/2016 MRI of the head. FINDINGS: Alignment: Straightening of cervical lordosis.  No listhesis. Vertebrae: No fracture, evidence of discitis, or bone lesion. Cord: No abnormal cord signal. Posterior Fossa, vertebral arteries, paraspinal tissues: Pontine and cerebellar chronic lacunar infarcts better seen on prior MR. Disc levels: C2-3: No significant disc displacement, foraminal narrowing, or canal stenosis. C3-4: Right central disc protrusion with anterior cord impingement and flattening. No significant foraminal narrowing. Mild-to-moderate canal stenosis. C4-5: Right foraminal small disc protrusion with uncovertebral and facet hypertrophy. Mild right foraminal narrowing. Mild canal stenosis with  slight anterior cord flattening. C5-6: Small disc osteophyte complex and right greater than left uncovertebral and facet hypertrophy. Mild left and moderate right foraminal narrowing. Mild canal stenosis. Slight anterior cord flattening. C6-7: No significant disc displacement, foraminal narrowing, or canal stenosis. C7-T1: Small disc bulge with no significant foraminal narrowing or canal stenosis. IMPRESSION: 1. No acute osseous abnormality or abnormal cord signal identified. 2. Cervical spondylosis discogenic and facet degenerative changes greatest at the C3-4 level. 3. At C3-4 a right central disc protrusion mildly impinges on the anterior cord with cord flattening and mild-to-moderate canal stenosis. 4. Multiple levels of mild foraminal narrowing and moderate foraminal narrowing at the right C5-6 level. Electronically Signed   By: Kristine Garbe M.D.   On: 03/29/2016 21:31   Mr Brain Limited Wo Contrast  Result Date: 03/30/2016 CLINICAL DATA:  Initial evaluation for left-sided numbness. EXAM: MRI HEAD WITHOUT CONTRAST TECHNIQUE: Multiplanar, multiecho pulse sequences of the brain and surrounding structures were obtained without intravenous contrast. COMPARISON:  Comparison made with prior MRI from 03/28/2016. FINDINGS: Limited thin-section DWI sequences were performed through the brainstem. Diffusion-weighted imaging demonstrates a small acute ischemic infarct within the left paramedian pons, measuring 9 mm (series 3, image 15). This was not clearly evident on previous MRI. No other evidence for acute or subacute ischemia on the limited images provided. Adjacent remote lacunar infarcts noted within the brainstem. IMPRESSION: 1. 9 mm acute ischemic infarct within the left paramedian pons. 2. Please note that this is a limited study with only thin section diffusion-weighted imaging performed through the brainstem. Electronically Signed   By: Jeannine Boga M.D.   On: 03/30/2016 23:12     Cardiac Studies   ECHO:   Left ventricle: The cavity size was mildly dilated. Wall   thickness was increased in a pattern of mild LVH. Systolic   function was severely reduced. The estimated ejection fraction   was in the range of 25% to 30%. Diffuse hypokinesis. There is   akinesis of the inferolateral myocardium. Doppler parameters are   consistent with abnormal left ventricular relaxation (grade 1   diastolic dysfunction). Doppler parameters are consistent with   high ventricular filling pressure. - Left atrium: The atrium was mildly  dilated. - Right ventricle: Systolic function was moderately reduced.  Patient Profile     Mr. Lucke is a 61 year old male with a past medical history of chronic systolic CHF, ischemia cardiomyopathy,DM, DVT (on Xarelto), HTN, and HLD. He was admitted numbness of the right upper and lower extremity, found to have a right vertebral artery occlusion, also with elevated troponin and reduced EF and cardiology was consulted.   Assessment & Plan    CARDIOMYOPATHY:  Seems to be euvolemic.  Added Imdur to his previous meds.    Plan out patient study (possibly perfusion) to risk stratify.  Change to Bidil today.    HTN:   BP is improved.  My plan would be to wean off of NTG overtime and add increased Imdur and ARNI  HYPOKALEMIA:  Supplemented this morning.   Signed, Minus Breeding, MD  03/31/2016, 10:32 AM

## 2016-03-31 NOTE — Progress Notes (Signed)
Physical Therapy Treatment Patient Details Name: [name] MRN: [id number]:[id number] DOB: 1955-03-01 Today's Date: 03/31/2016    History of Present Illness  [name] is a 61 y.o. male with history of diabetes mellitus type 2, hypertension, CHF, DVT presents to ER because of increasing numbness of the right upper and lower extremity. MRI brain showed no acute stroke. MRA of head and neck showed right vertebral artery occlusion.    PT Comments    Increased pushing to the right today. Increased assist to remain sitting up right. Difficulty not pushing to the right in standing. Performed sit to stand transfer 3x but unable to take steps. +2 assistance required tomorrow. [facility].   Follow Up Recommendations  CIR     Equipment Recommendations  Rolling walker with 5" wheels    Recommendations for Other Services       Precautions / Restrictions Precautions Precautions: Fall    Mobility  Bed Mobility Overal bed mobility: Needs Assistance Bed Mobility: Supine to Sit;Sit to Supine     Supine to sit: Min assist Sit to supine: Supervision;Min assist   General bed mobility comments: min a to scoot hips to the edge of the bed today. Once to the edge of the bed min a. [name] was pushing to the right.   Transfers Overall transfer level: Needs assistance Equipment used: Rolling walker (2 wheeled) Transfers: Sit to/from Stand           General transfer comment: Patient stood 3x with mdd/max a. He consistently pushed to the right and had difficulty pulling back on the walker. Therapy did not have +2 assistance. The patient was unsafe to take steps away from the bed.    Ambulation/Gait                 Stairs            Wheelchair Mobility    Modified Rankin (Stroke Patients Only)       Balance Overall balance assessment: Needs assistance Sitting-balance support: Bilateral upper extremity supported Sitting balance-Leahy Scale:  Poor Sitting balance - Comments: pushing to the left    Standing balance support: Bilateral upper extremity supported Standing balance-Leahy Scale: Poor Standing balance comment: pushing to the right                     Cognition Arousal/Alertness: Awake/alert Behavior During Therapy: WFL for tasks assessed/performed Overall Cognitive Status: Within Functional Limits for tasks assessed                      Exercises      General Comments        Pertinent Vitals/Pain Pain Assessment: No/denies pain    Home Living                      Prior Function            PT Goals (current goals can now be found in the care plan section) Acute Rehab PT Goals Patient Stated Goal: return to his independence PT Goal Formulation: With patient Time For Goal Achievement: 04/13/16 Potential to Achieve Goals: Good Progress towards PT goals: Progressing toward goals    Frequency    Min 3X/week      PT Plan Current plan remains appropriate    Co-evaluation             End of Session Equipment Utilized During Treatment: Gait belt Activity  Tolerance: Patient tolerated treatment well Patient left: with call bell/phone within reach;in bed;with bed alarm set     Time: [id number] PT Time Calculation (min) (ACUTE ONLY): 18 min  Charges:  $Therapeutic Activity: 8-22 mins                    G Codes:  Functional Assessment Tool Used: clinical judgement and objective findings Functional Limitation: Mobility: Walking and moving around Mobility: Walking and Moving Around Current Status [id number]): At least 20 percent but less than 40 percent impaired, limited or restricted Mobility: Walking and Moving Around Goal Status [phone]): At least 1 percent but less than 20 percent impaired, limited or restricted   Carney Living PT DPT  03/31/2016, 4:15 PM

## 2016-03-31 NOTE — Progress Notes (Signed)
Occupational Therapy Treatment Patient Details Name: [name] MRN: [place]:[id number] DOB: [birth year] Today's Date: 03/31/2016    History of present illness  [name] is a 61 y.o. male with history of diabetes mellitus type 2, hypertension, CHF, DVT presents to ER because of increasing numbness of the right upper and lower extremity. MRI brain showed no acute stroke. MRA of head and neck showed right vertebral artery occlusion.   OT comments  Pt. Progressing with acute OT goals.  Able to complete multiple grooming tasks this am.  Will benefit from continued  CIR level therapies prior to home to focus on strengthening and compensatory strategies due to notable R hand weakness and vision deficits.    Follow Up Recommendations  CIR;Supervision/Assistance - 24 hour    Equipment Recommendations  3 in 1 bedside commode;Tub/shower bench    Recommendations for Other Services Rehab consult    Precautions / Restrictions Precautions Precautions: Fall       Mobility Bed Mobility                  Transfers                      Balance                                   ADL Overall ADL's : Needs assistance/impaired     Grooming: Wash/dry hands;Wash/dry face;Oral care;Supervision/safety;Set up;Bed level Grooming Details (indicate cue type and reason): difficulty with manipulating objects                             Functional mobility during ADLs: +2 for physical assistance General ADL Comments: pt. able to wash face with set up.  required verbal and tactile cues for completion of t.paste application to t.brush.  compensatory strategies introduced to compensate for notable R hand weakness      Vision                     Perception     Praxis      Cognition   Behavior During Therapy: WFL for tasks assessed/performed Overall Cognitive Status: Within Functional Limits for tasks assessed                        Extremity/Trunk Assessment               Exercises     Shoulder Instructions       General Comments      Pertinent Vitals/ Pain       Pain Assessment: No/denies pain  Home Living                                          Prior Functioning/Environment              Frequency  Min 3X/week        Progress Toward Goals  OT Goals(current goals can now be found in the care plan section)  Progress towards OT goals: Progressing toward goals     Plan Discharge plan remains appropriate    Co-evaluation                 End of Session  Activity Tolerance Patient tolerated treatment well   Patient Left in bed;with call bell/phone within reach;with bed alarm set   Nurse Communication          Time: 0702-0716 OT Time Calculation (min): 14 min  Charges: OT General Charges $OT Visit: 1 Procedure OT Treatments $Self Care/Home Management : 8-22 mins  Janice Coffin, COTA/L 03/31/2016, 7:17 AM

## 2016-04-01 ENCOUNTER — Inpatient Hospital Stay (HOSPITAL_COMMUNITY): Payer: Medicare Other

## 2016-04-01 DIAGNOSIS — I429 Cardiomyopathy, unspecified: Secondary | ICD-10-CM

## 2016-04-01 DIAGNOSIS — G458 Other transient cerebral ischemic attacks and related syndromes: Secondary | ICD-10-CM

## 2016-04-01 DIAGNOSIS — R269 Unspecified abnormalities of gait and mobility: Secondary | ICD-10-CM

## 2016-04-01 DIAGNOSIS — I69393 Ataxia following cerebral infarction: Secondary | ICD-10-CM

## 2016-04-01 DIAGNOSIS — I639 Cerebral infarction, unspecified: Secondary | ICD-10-CM | POA: Diagnosis present

## 2016-04-01 DIAGNOSIS — I6339 Cerebral infarction due to thrombosis of other cerebral artery: Principal | ICD-10-CM

## 2016-04-01 LAB — GLUCOSE, CAPILLARY
GLUCOSE-CAPILLARY: 215 mg/dL — AB (ref 65–99)
Glucose-Capillary: 306 mg/dL — ABNORMAL HIGH (ref 65–99)
Glucose-Capillary: 279 mg/dL — ABNORMAL HIGH (ref 65–99)
Glucose-Capillary: 224 mg/dL — ABNORMAL HIGH (ref 65–99)

## 2016-04-01 LAB — BASIC METABOLIC PANEL
Anion gap: 8 (ref 5–15)
BUN: 18 mg/dL (ref 6–20)
CALCIUM: 9 mg/dL (ref 8.9–10.3)
CO2: 26 mmol/L (ref 22–32)
Chloride: 105 mmol/L (ref 101–111)
Creatinine, Ser: 1.49 mg/dL — ABNORMAL HIGH (ref 0.61–1.24)
GFR, EST AFRICAN AMERICAN: 57 mL/min — AB (ref >60)
GFR, EST NON AFRICAN AMERICAN: 49 mL/min — AB (ref >60)
Glucose, Bld: 240 mg/dL — ABNORMAL HIGH (ref 65–99)
Potassium: 3.5 mmol/L (ref 3.5–5.1)
SODIUM: 139 mmol/L (ref 135–145)

## 2016-04-01 LAB — MAGNESIUM: MAGNESIUM: 1.9 mg/dL (ref 1.7–2.4)

## 2016-04-01 MED ORDER — INSULIN ASPART PROT & ASPART (70-30 MIX) 100 UNIT/ML ~~LOC~~ SUSP
60.0000 [IU] | Freq: Every day | SUBCUTANEOUS | Status: DC
  Administered 2016-04-02 – 2016-04-03 (×2): 60 [IU] via SUBCUTANEOUS
  Filled 2016-04-01: qty 10

## 2016-04-01 MED ORDER — POTASSIUM CHLORIDE CRYS ER 20 MEQ PO TBCR
60.0000 meq | EXTENDED_RELEASE_TABLET | Freq: Once | ORAL | Status: AC
  Administered 2016-04-01: 60 meq via ORAL
  Filled 2016-04-01: qty 3

## 2016-04-01 NOTE — Progress Notes (Signed)
VASCULAR LAB PRELIMINARY  PRELIMINARY  PRELIMINARY  PRELIMINARY  Carotid duplex completed.    Preliminary report:  1-39% ICA plaquing.  Unable to visualize right vertebral or distal right ICA secondary to high bifurcation.  Left vertebral artery flow is antegrade. Technically difficult study secondary to high bifurcation, body habitus, and vessel tortuosity.  Neima Lacross, RVT 04/01/2016, 9:25 AM

## 2016-04-01 NOTE — Progress Notes (Signed)
Progress Note  Patient Name: [name] Date of Encounter: 04/01/2016  Primary Cardiologist: New  Subjective   Feels well. Switched to bidil yesterday. Creatinine trending down. MRI showed pontine infarct. Appears euvolemic on current meds.  Inpatient Medications    Scheduled Meds: .  stroke: mapping our early stages of recovery book   Does not apply Once  . atorvastatin  40 mg Oral q1800  . carvedilol  25 mg Oral BID WC  . chlorthalidone  25 mg Oral Daily  . cloNIDine  0.2 mg Oral TID  . furosemide  80 mg Oral Daily  . insulin aspart  0-9 Units Subcutaneous TID WC  . insulin aspart protamine- aspart  35 Units Subcutaneous Q supper  . insulin aspart protamine- aspart  55 Units Subcutaneous Q breakfast  . isosorbide-hydrALAZINE  2 tablet Oral TID  . losartan  100 mg Oral Daily  . rivaroxaban  20 mg Oral Q supper   Continuous Infusions:  PRN Meds: acetaminophen **OR** acetaminophen (TYLENOL) oral liquid 160 mg/5 mL **OR** acetaminophen, bismuth subsalicylate   Vital Signs    Vitals:   03/31/16 2141 03/31/16 2328 04/01/16 0100 04/01/16 0500  BP: (!) 145/51 (!) 123/40 (!) 130/42 (!) 125/51  Pulse: 72 74 76 76  Resp: 18 16 16 17   Temp: 99.4 F (37.4 C) 100 F (37.8 C) 99.2 F (37.3 C) 99 F (37.2 C)  TempSrc: Oral Oral Oral Oral  SpO2: 97% 95% 96% 97%  Weight:      Height:        Intake/Output Summary (Last 24 hours) at 04/01/16 0757 Last data filed at 03/31/16 1700  Gross per 24 hour  Intake                0 ml  Output              325 ml  Net             -325 ml   Filed Weights   03/28/16 2044  Weight: 245 lb (111.1 kg)    Telemetry    NSR, PVCs, short run of IVR. - Personally Reviewed  ECG    NA - Personally Reviewed  Physical Exam   GEN: No acute distress.   Neck: No JVD Cardiac: RRR, no murmurs, rubs, or gallops.  Respiratory: Clear to auscultation bilaterally. GI: Soft, nontender, non-distended  MS: No edema; No deformity. Neuro:   Nonfocal  Psych: Normal affect   Labs    Chemistry  Recent Labs Lab 03/28/16 2113 03/28/16 2121 03/31/16 0329 04/01/16 0221  NA 139 143 142 139  K 3.3* 3.4* 3.2* 3.5  CL 103 102 106 105  CO2 26  --  26 26  GLUCOSE 165* 164* 113* 240*  BUN 18 18 16 18   CREATININE 1.18 1.30* 1.52* 1.49*  CALCIUM 8.9  --  9.2 9.0  PROT 7.7  --   --   --   ALBUMIN 3.8  --   --   --   AST 24  --   --   --   ALT 17  --   --   --   ALKPHOS 61  --   --   --   BILITOT 0.6  --   --   --   GFRNONAA >60  --  48* 49*  GFRAA >60  --  56* 57*  ANIONGAP 10  --  10 8     Hematology  Recent Labs Lab 03/28/16  2113 03/28/16 2121  WBC 5.9  --   RBC 5.06  --   HGB 14.6 15.3  HCT 43.3 45.0  MCV 85.6  --   MCH 28.9  --   MCHC 33.7  --   RDW 12.7  --   PLT 210  --     Cardiac Enzymes  Recent Labs Lab 03/29/16 0931  TROPONINI 0.14*     Recent Labs Lab 03/28/16 2119  TROPIPOC 0.07     BNPNo results for input(s): BNP, PROBNP in the last 168 hours.   DDimer No results for input(s): DDIMER in the last 168 hours.   Radiology    Mr Brain Limited Wo Contrast  Result Date: 03/30/2016 CLINICAL DATA:  Initial evaluation for left-sided numbness. EXAM: MRI HEAD WITHOUT CONTRAST TECHNIQUE: Multiplanar, multiecho pulse sequences of the brain and surrounding structures were obtained without intravenous contrast. COMPARISON:  Comparison made with prior MRI from 03/28/2016. FINDINGS: Limited thin-section DWI sequences were performed through the brainstem. Diffusion-weighted imaging demonstrates a small acute ischemic infarct within the left paramedian pons, measuring 9 mm (series 3, image 15). This was not clearly evident on previous MRI. No other evidence for acute or subacute ischemia on the limited images provided. Adjacent remote lacunar infarcts noted within the brainstem. IMPRESSION: 1. 9 mm acute ischemic infarct within the left paramedian pons. 2. Please note that this is a limited study with only  thin section diffusion-weighted imaging performed through the brainstem. Electronically Signed   By: Jeannine Boga M.D.   On: 03/30/2016 23:12    Cardiac Studies   ECHO:   Left ventricle: The cavity size was mildly dilated. Wall   thickness was increased in a pattern of mild LVH. Systolic   function was severely reduced. The estimated ejection fraction   was in the range of 25% to 30%. Diffuse hypokinesis. There is   akinesis of the inferolateral myocardium. Doppler parameters are   consistent with abnormal left ventricular relaxation (grade 1   diastolic dysfunction). Doppler parameters are consistent with   high ventricular filling pressure. - Left atrium: The atrium was mildly dilated. - Right ventricle: Systolic function was moderately reduced.  Patient Profile     Mr. Schnaible is a 61 year old male with a past medical history of chronic systolic CHF, ischemia cardiomyopathy,DM, DVT (on Xarelto), HTN, and HLD. He was admitted numbness of the right upper and lower extremity, found to have a right vertebral artery occlusion, also with elevated troponin and reduced EF and cardiology was consulted.   Assessment & Plan    CARDIOMYOPATHY:  Seems to be euvolemic.  On statin, b-blocker, lasix and Bidil.  Plan out patient study (possibly perfusion) to risk stratify.    HTN:   BP is improved. Continue current medications.  STROKE: Pontine infarct noted - on Xarelto  Pixie Casino, MD, [facility] Attending Cardiologist [facility]  Pixie Casino, MD  04/01/2016, 7:57 AM

## 2016-04-01 NOTE — Progress Notes (Signed)
STROKE TEAM PROGRESS NOTE   HISTORY OF PRESENT ILLNESS (per record) [name] is an 61 y.o. male who is on Xarelto for CHF and low EF, blind in his left eye, hypercholesterolemia, hypertension, MI, significant peripheral neuropathy who presents the hospital after 2 day history of sudden onset of right-sided numbness and difficulty with his gait. MRI of the brain showed no acute infarcts however he did show old bilateral thalamic and bilateral basal ganglia lacunar infarcts along with multiple old pontine, cerebellar infarcts. MRI of the cervical spine showed no acute osseous abnormalities. He does have cervical spondylosis with discogenic and facet degenerative changes greatest at C3-4. There is a CT for right central disc protrusion mildly impinging on the anterior cord with cord flattening however there is no mention of any edema.  Patient states that he was doing some neck exercises approximately 2 days ago where he would move his neck around a different rotations approximately 60 times and at that time he suddenly felt some numbness in his face arm and leg on the right side. He also noted that he was having gait difficulty as he feels that he cannot extend and move his right leg as usual. When he is very unsteady on his feet.  Patient currently has significant bilateral peripheral neuropathy in which he has no proprioception, vibratory sensation and has no cold sensation all the way up to his knees. However per daughter who is in the room his gait has never been this significantly abnormal.   SUBJECTIVE (INTERVAL HISTORY) His family was not at the bedside.  Overall he feels his condition is not better today.  He is especially aware of the right sided numbness and states it makes it difficult to hold things in his right hand.  He again asked for assistance with his long time challenges of getting the correct strips for his glucometer.  He apparently needs one with audio feature because can not  see the numbers.  This is the machine he has but the strips are "impossible" to find.   He asked for a Case Management consult.   OBJECTIVE Temp:  [98.1 F (36.7 C)-100 F (37.8 C)] 99 F (37.2 C) (02/04 1003) Pulse Rate:  [72-76] 76 (02/04 0500) Cardiac Rhythm: Normal sinus rhythm (02/04 0700) Resp:  [16-20] 16 (02/04 1003) BP: (123-161)/(40-72) 161/72 (02/04 1003) SpO2:  [95 %-97 %] 95 % (02/04 1003)  CBC:   Recent Labs Lab 03/28/16 2113 03/28/16 2121  WBC 5.9  --   NEUTROABS 2.5  --   HGB 14.6 15.3  HCT 43.3 45.0  MCV 85.6  --   PLT 210  --     Basic Metabolic Panel:   Recent Labs Lab 03/31/16 0329 04/01/16 0221  NA 142 139  K 3.2* 3.5  CL 106 105  CO2 26 26  GLUCOSE 113* 240*  BUN 16 18  CREATININE 1.52* 1.49*  CALCIUM 9.2 9.0  MG  --  1.9    Lipid Panel:     Component Value Date/Time   CHOL 235 (H) 03/29/2016 0531   TRIG 160 (H) 03/29/2016 0531   HDL 36 (L) 03/29/2016 0531   CHOLHDL 6.5 03/29/2016 0531   VLDL 32 03/29/2016 0531   LDLCALC 167 (H) 03/29/2016 0531   HgbA1c:  Lab Results  Component Value Date   HGBA1C 11.0 (H) 03/29/2016   Urine Drug Screen:     Component Value Date/Time   LABOPIA NONE DETECTED 03/29/2016 0227   COCAINSCRNUR NONE DETECTED  03/29/2016 0227   LABBENZ NONE DETECTED 03/29/2016 0227   AMPHETMU NONE DETECTED 03/29/2016 0227   THCU NONE DETECTED 03/29/2016 0227   LABBARB NONE DETECTED 03/29/2016 0227      IMAGING  Mr Cervical Spine Wo Contrast 03/29/2016 1. No acute osseous abnormality or abnormal cord signal identified.  2. Cervical spondylosis discogenic and facet degenerative changes greatest at the C3-4 level.  3. At C3-4 a right central disc protrusion mildly impinges on the anterior cord with cord flattening and mild-to-moderate canal stenosis.  4. Multiple levels of mild foraminal narrowing and moderate foraminal narrowing at the right C5-6 level.    Mr Brain Limited Wo Contrast 03/30/2016 1. 9 mm acute  ischemic infarct within the left paramedian pons.  2. Please note that this is a limited study with only thin section diffusion-weighted imaging performed through the brainstem.    MRI Head Wo Contrast 03/29/2016 No acute intracranial process on this motion degraded examination. 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 Moderate to severe chronic small vessel ischemic disease. Moderate global parenchymal brain volume loss, advanced for age.   MRA Head  03/29/2016 Motion degraded examination.  RIGHT vertebral artery occlusion, likely old. Severe stenoses bilateral internal carotid arteries due to atherosclerosis.  Multifocal moderate stenoses posterior circulation compatible with atherosclerosis.  MRA Neck 03/29/2016 Occluded RIGHT vertebral artery without significant reconstitution. Moderate atherosclerosis LEFT vertebral artery. No hemodynamically significant stenosis of the carotid arteries.  Transthoracic echocardiogram 03/29/2016 Study Conclusions - Left ventricle: The cavity size was mildly dilated. Wall   thickness was increased in a pattern of mild LVH. Systolic   function was severely reduced. The estimated ejection fraction   was in the range of 25% to 30%. Diffuse hypokinesis. There is   akinesis of the inferolateral myocardium. Doppler parameters are   consistent with abnormal left ventricular relaxation (grade 1   diastolic dysfunction). Doppler parameters are consistent with   high ventricular filling pressure. - Left atrium: The atrium was mildly dilated. - Right ventricle: Systolic function was moderately reduced. Impressions: - Global hypokinesis with akinesis of the inferolateral wall;   overall severely reduced LV systolic function; grade 1 diastolic   dysfunction; elevated LV filliing pressure; mild LAE; RV not well   visualized but function appears moderately reduced.    PHYSICAL EXAM HEENT-  Normocephalic, no lesions, without obvious  abnormality.  Normal external eye and conjunctiva.  Normal TM's bilaterally.  Normal auditory canals and external ears. Normal external nose, mucus membranes and septum.  Normal pharynx. Cardiovascular- S1, S2 normal, pulses palpable throughout   Lungs- chest clear, no wheezing, rales, normal symmetric air entry, Heart exam - S1, S2 normal, no murmur, no gallop, rate regular Abdomen- normal findings: bowel sounds normal Extremities- no edema Lymph-no adenopathy palpable Musculoskeletal-no joint tenderness, deformity or swelling Skin-warm and dry, no hyperpigmentation, vitiligo, or suspicious lesions  Neurological Examination Mental Status: Alert, oriented, thought content appropriate.  Speech fluent without evidence of aphasia.  Able to follow 3 step commands without difficulty. Cranial Nerves: II: ; Visual fields grossly normal, right pupil is 2 mm round and reactive. Left pupil is 3 mm nonreactive and patient is blind in his left eye. III,IV, VI: ptosis not present, extra-ocular motions intact bilaterally V,VII: smile symmetric, facial light touch sensation normal bilaterally VIII: hearing normal bilaterally IX,X: uvula rises symmetrically XI: bilateral shoulder shrug XII: midline tongue extension Motor: Right :  Upper extremity   5/5  Left:     Upper extremity   5/5             Lower extremity   5/5                                                  Lower extremity   5/5 I do not note any significant increase in tone throughout his upper or lower extremities Sensory: Pinprick and light touch intact throughout, bilaterally upper extremities however as noted above patient has no proprioception of his lower extremities, no vibratory sensation in his lower extremities and temperature sensation is decreased although it to the knees Deep Tendon Reflexes: 2+ and symmetric throughout bilateral upper extremities with 1+ bilateral knee jerk and no ankle  jerk Plantars: Mute bilaterally Cerebellar: normal finger-to-nose on left, some pass pointing on right.  Gait: Very unsteady, tends to leave his right leg stiff and circumduct's, shuffled gait.     ASSESSMENT/PLAN Mr. YONIEL HINZMAN is a 61 y.o. male with history of peripheral vascular disease, peripheral neuropathy, coronary artery disease with previous MI, hypertension, low EF on Xarelto, hyperlipidemia, diabetes mellitus, congestive heart failure, blind left eye, adrenal insufficiency, and herpes presenting with right-sided numbness and unsteady gait. He did not receive IV t-PA due to late presentation.  Stroke:  Dominant infarct secondary to small vessel disease.  Resultant  Right sided numbness and weakness  MRI - 9 mm acute ischemic infarct within the left paramedian pons.   MRA - RIGHT vertebral artery occlusion, likely old. Severe stenoses bilateral internal carotid arteries.  MRA Neck - No hemodynamically significant stenosis of the carotid arteries.  Carotid Doppler - MRA neck  2D Echo -  EF 25% to 30%. No cardiac source of emboli identified.  LDL - 167  HgbA1c - 11.0  VTE prophylaxis - Xarelto Diet Carb Modified Fluid consistency: Thin; Room service appropriate? Yes  Xarelto (rivaroxaban) daily prior to admission, now on Xarelto (rivaroxaban) daily  Patient counseled to be compliant with his antithrombotic medications  Ongoing aggressive stroke risk factor management  Therapy recommendations:  CIR recommended.  Disposition: Pending  Hypertension  Stable  Permissive hypertension (OK if < 220/120) but gradually normalize in 5-7 days  Long-term BP goal normotensive  Hyperlipidemia  Home meds:  No lipid lowering medications prior to admission  LDL 167, goal < 70  Add Lipitor 40 mg daily  Continue statin at discharge  Diabetes  HgbA1c 11.0, goal < 7.0  Uncontrolled  Other Stroke Risk Factors  Advanced age  Obesity, Body mass index is 36.18  kg/m., recommend weight loss, diet and exercise as appropriate   Hx stroke/TIA  Coronary artery disease   Other Active Problems  Hypokalemia - 3.2 -> 3.5  Creatinine - 1.52 -> 1.49  Bilateral carotid artery disease - ? Stent candidate - consider consult Dr Estanislado Pandy  CAD - Cardiology consult 03/29/2016 - will eventually need further cardiac w/u per Dr Hochrein's note.   Hospital day # 2  ATTENDING NOTE: Patient was seen and examined by me personally. Documentation reflects findings. The laboratory and radiographic studies reviewed by me. ROS completed by me personally and pertinent positives fully documented Condition:  stable  Assessment and plan completed by me personally and fully documented above. Plans/Recommendations include:     I had a follow-up discussion with patient regarding blood sugar and  cholesterol.  He and family would benefit from diabetes education given A1C is 11  Carotid u/s to follow-up on MRA shows that disease is not extracranial.  Management of secondary prevention is challenging given the need for anticoagulation give low EF and question of afib versus the severe intracranial disease which may benefit from dual antiplatelet therapy (as described in the SAMMPRIS trial.  Would continue Xarelto for now.  I have included the SAMMPRIS info should patient continue to have events.  Clearly the additional modifiable risk factors are also key issues    SAMMPRIS Aggressive Medical Management Medical management consisted of aspirin, at a dose of 325 mg per day; clopidogrel, at a dose of 75 mg per day for 90 days after enrollment; management of the primary risk factors (elevated systolic blood pressure and elevated low-density lipoprotein [LDL] cholesterol levels); and management of secondary risk factors (diabetes, elevated non-high-density lipoprotein [non-HDL] cholesterol levels, smoking, excess weight, and insufficient exercise) with the help of a lifestyle  modification program. With respect to the primary risk factors, the target systolic blood pressure was less than 140 mm Hg (<130 mm Hg in the case of patients with diabetes) and an LDL cholesterol level of less than 70 mg per deciliter (1.81 mmol per liter). Patients were provided aspirin, clopidogrel, one drug from each major class of antihypertensive agents (at medially reasonable doses), rosuvastatin, and the lifestyle program to the study patients.   Primary team following potassium, creatinine, glucose  Will need a Case Management consult for possible SNF; if ultimately disposition includes home, patient will need a lot of support services to manage glucose given his visual impairments  Patient will need follow-up with outpatient stroke team 2 months after discharge  No further recommendations at this time  SIGNED BY: Dr. Elissa Hefty    To contact Stroke Continuity provider, please refer to [email or web address] After hours, contact General Neurology

## 2016-04-01 NOTE — Progress Notes (Signed)
PROGRESS NOTE    [name]  [id number] DOB: 1955/05/25 DOA: 03/28/2016 PCP: [name], MD    Brief Narrative:  61 y.o.malewith history of diabetes mellitus type 2, hypertension, CHF, DVT presents to ER because of increasing numbness of the right upper and lower extremity. Patient's symptoms started around 7 PM last evening. Denies any associated weakness of extremities or any difficulty swallowing or speaking or visual symptoms. CT of the head was unremarkable. On-call neurologist Dr. Jacqulyn Liner consulted and requested patient be transferred to [facility]. MRI of the brain and MRA of the head and neck was showing no acute infarct but did show occlusion of the right vertebral artery. On my exam patient still complains of right upper and lower extremity numbness but no obvious weakness. Patient's blood pressure is also markedly elevated on presentation which improved without any intervention.   Assessment & Plan:   Principal Problem:   TIA (transient ischemic attack) Active Problems:   Diabetes type 2, uncontrolled (HCC)   Cardiomyopathy ([place])   DVT (deep venous thrombosis) (HCC)   Numbness on right side   Hypertensive urgency   Right arm numbness   Cerebral infarction due to thrombosis of other cerebral artery (HCC)   Brainstem stroke ([place])  1. Acute L Paramedian Pons CVA 1. Case was discussed with Dr. Cheral Marker prior to transfer to [facility] 2. Repeat MRI confirming acute 53mm L paramedian pons infarct 3. Patient is continued on xarelto 4. Stable at present 2. Hypertensive urgency 1. Continued on clonidine, hydralazine, cozaar 2. Imdur changed to bidil per Cardiology 3. BP currently stable, albeit suboptimal 4. Monitor for now. If remains suboptimal, then consider increasing clonidine to 0.3mg  3. History of DVT  1. Reportedly missed his xarelto dose last 3 days.  2. Xarelto continued 3. Stable currently 4. Diabetes mellitus type 2  1. patient is on  NovoLog 70/30 twice a day.  2. Glucose remains in the 200's, during the day.  3. Appears to be better controlled in the evening 4. Increased AM dose of 70/30 insulin. Glucose remains elevated 5. Continue to titrate insulin as needed  5. History of CHF  1. last EF measured in 2014 was 60-65% with grade 1 diastolic dysfunction - appears compensated. On Lasix. 2. Repeat 2d echo results reviewed with EF of 25-30%. 3. Cardiology following, with recs for perfusion study as outpatient 4. Remains stable  DVT prophylaxis: Xarelto Code Status: Full Family Communication: Pt in room, family not at bedside Disposition Plan: Uncertain at this time  Consultants:   Neurology  Cardiology  CIR  Procedures:     Antimicrobials: Anti-infectives    None      Subjective: No complaints today  Objective: Vitals:   04/01/16 0100 04/01/16 0500 04/01/16 1003 04/01/16 1411  BP: (!) 130/42 (!) 125/51 (!) 161/72 (!) 160/66  Pulse: 76 76  68  Resp: 16 17 16 16   Temp: 99.2 F (37.3 C) 99 F (37.2 C) 99 F (37.2 C) 98.9 F (37.2 C)  TempSrc: Oral Oral Oral Oral  SpO2: 96% 97% 95% 98%  Weight:      Height:        Intake/Output Summary (Last 24 hours) at 04/01/16 1541 Last data filed at 04/01/16 1400  Gross per 24 hour  Intake              240 ml  Output              825 ml  Net             -  585 ml   Filed Weights   03/28/16 2044  Weight: 111.1 kg (245 lb)    Examination:  General exam: conversant, in nad Respiratory system: normal resp effort, no wheezing on auscultation Cardiovascular system: regular rhythm, s1-2 on auscultation Gastrointestinal system: pos BS, soft, nondistended Central nervous system: cn2-12 grossly intact, strength intact Extremities: no cyanosis, perfused Skin: no pallor, no rashes Psychiatry: affect normal// no auditory hallucinations .   Data Reviewed: I have personally reviewed following labs and imaging studies  CBC:  Recent Labs Lab  03/28/16 2113 03/28/16 2121  WBC 5.9  --   NEUTROABS 2.5  --   HGB 14.6 15.3  HCT 43.3 45.0  MCV 85.6  --   PLT 210  --    Basic Metabolic Panel:  Recent Labs Lab 03/28/16 2113 03/28/16 2121 03/31/16 0329 04/01/16 0221  NA 139 143 142 139  K 3.3* 3.4* 3.2* 3.5  CL 103 102 106 105  CO2 26  --  26 26  GLUCOSE 165* 164* 113* 240*  BUN 18 18 16 18   CREATININE 1.18 1.30* 1.52* 1.49*  CALCIUM 8.9  --  9.2 9.0  MG  --   --   --  1.9   GFR: Estimated Creatinine Clearance: 64.8 mL/min (by C-G formula based on SCr of 1.49 mg/dL (H)). Liver Function Tests:  Recent Labs Lab 03/28/16 2113  AST 24  ALT 17  ALKPHOS 61  BILITOT 0.6  PROT 7.7  ALBUMIN 3.8   No results for input(s): LIPASE, AMYLASE in the last 168 hours. No results for input(s): AMMONIA in the last 168 hours. Coagulation Profile:  Recent Labs Lab 03/28/16 2113  INR 0.92   Cardiac Enzymes:  Recent Labs Lab 03/29/16 0931  TROPONINI 0.14*   BNP (last 3 results) No results for input(s): PROBNP in the last 8760 hours. HbA1C: No results for input(s): HGBA1C in the last 72 hours. CBG:  Recent Labs Lab 03/31/16 1140 03/31/16 1643 03/31/16 2145 04/01/16 0633 04/01/16 1110  GLUCAP 232* 292* 263* 215* 306*   Lipid Profile: No results for input(s): CHOL, HDL, LDLCALC, TRIG, CHOLHDL, LDLDIRECT in the last 72 hours. Thyroid Function Tests: No results for input(s): TSH, T4TOTAL, FREET4, T3FREE, THYROIDAB in the last 72 hours. Anemia Panel: No results for input(s): VITAMINB12, FOLATE, FERRITIN, TIBC, IRON, RETICCTPCT in the last 72 hours. Sepsis Labs: No results for input(s): PROCALCITON, LATICACIDVEN in the last 168 hours.  No results found for this or any previous visit (from the past 240 hour(s)).   Radiology Studies: Mr Brain Limited Wo Contrast  Result Date: 03/30/2016 CLINICAL DATA:  Initial evaluation for left-sided numbness. EXAM: MRI HEAD WITHOUT CONTRAST TECHNIQUE: Multiplanar, multiecho  pulse sequences of the brain and surrounding structures were obtained without intravenous contrast. COMPARISON:  Comparison made with prior MRI from 03/28/2016. FINDINGS: Limited thin-section DWI sequences were performed through the brainstem. Diffusion-weighted imaging demonstrates a small acute ischemic infarct within the left paramedian pons, measuring 9 mm (series 3, image 15). This was not clearly evident on previous MRI. No other evidence for acute or subacute ischemia on the limited images provided. Adjacent remote lacunar infarcts noted within the brainstem. IMPRESSION: 1. 9 mm acute ischemic infarct within the left paramedian pons. 2. Please note that this is a limited study with only thin section diffusion-weighted imaging performed through the brainstem. Electronically Signed   By: Jeannine Boga M.D.   On: 03/30/2016 23:12    Scheduled Meds: .  stroke: mapping our early stages of  recovery book   Does not apply Once  . atorvastatin  40 mg Oral q1800  . carvedilol  25 mg Oral BID WC  . chlorthalidone  25 mg Oral Daily  . cloNIDine  0.2 mg Oral TID  . furosemide  80 mg Oral Daily  . insulin aspart  0-9 Units Subcutaneous TID WC  . insulin aspart protamine- aspart  35 Units Subcutaneous Q supper  . [START ON 04/02/2016] insulin aspart protamine- aspart  60 Units Subcutaneous Q breakfast  . isosorbide-hydrALAZINE  2 tablet Oral TID  . losartan  100 mg Oral Daily  . rivaroxaban  20 mg Oral Q supper   Continuous Infusions:   LOS: 2 days   CHIU, Orpah Melter, MD [facility] Pager [phone]  If 7PM-7AM, please contact night-coverage [email or web address] Password [email or web address] 04/01/2016, 3:41 PM

## 2016-04-01 NOTE — Consult Note (Signed)
Physical Medicine and Rehabilitation Consult Reason for Consult:Eval Rehab needs Referring Phsyician: Jaymz Maddaloni is an 61 y.o. male.   HPI: 61 year old male with history of chronic systolic CHF, ischemic cardiomyopathy, DVT on Xarelto who was admitted 03/28/2016 with right upper extremity and right lower extremity numbness. Initially evaluated by was a long ED and not felt to be a TPA candidate. Initial blood pressure 204/86. Head, T wave inversions on EKG and troponin of 0.14. Ejection fr 25-30%, inferior lateral akinesis. Coreg was added. 03/28/2016 MRI demonstrated old bilateral thalamic and bilateral basal ganglia lacunar infarcts. Multiple old pontine and cerebellar infarcts. MRI of cervical spine showed facet degenerative changes. Right central disc protrusion C3-C4. MRA showed right greater left carotid siphon stenosis, moderate basilar artery stenosis On 03/30/2016. Neurology was consulted, note of a severe bilateral peripheral neuropathy with absent sensation to the knees Repeat MRI of the brain was ordered to 04/17/2016 demonstrate left paramedian pontine infarct. 9 mm acute  PT note 03/31/2016, bed mobility, supervision to min assist, plus 2 assist with standing.  Review of Systems  Constitutional: Negative for chills and fever.  HENT: Negative for congestion.   Eyes: Positive for blurred vision.  Respiratory: Negative for cough, shortness of breath, wheezing and stridor.   Cardiovascular: Positive for leg swelling. Negative for chest pain.  Gastrointestinal: Negative for abdominal pain, nausea and vomiting.  Genitourinary: Negative for dysuria.  Musculoskeletal: Negative for back pain and neck pain.  Skin: Negative for itching.  Neurological: Positive for focal weakness. Negative for headaches.  Endo/Heme/Allergies: Bruises/bleeds easily.  Psychiatric/Behavioral: Positive for memory loss. The patient does not have insomnia.     Past Medical History:  Diagnosis Date   . Adrenal insufficiency ([place])   . Allergy   . Arthritis   . Blind left eye   . Carpal tunnel syndrome   . Cataract    bil removed  . CHF (congestive heart failure) ([place])   . Diabetes mellitus without complication ([place])   . ED (erectile dysfunction)   . Gout   . Herpes   . Hypercholesterolemia   . Hypertension   . MI (myocardial infarction)   . Peripheral neuropathy ([place])   . Pneumonia   . PVD (peripheral vascular disease) ([place])    Past Surgical History:  Procedure Laterality Date  . CHOLECYSTECTOMY    . dilatera cataracts removed    . right knee surgary    . stents     Family History  Problem Relation Age of Onset  . Breast cancer Mother   . Hypertension Father     blood clot in leg  . Diabetes Maternal Aunt   . Heart attack Sister   . Colon cancer Neg Hx   . Esophageal cancer Neg Hx   . Rectal cancer Neg Hx   . Stomach cancer Neg Hx    Social History:  reports that he has never smoked. He has never used smokeless tobacco. He reports that he does not drink alcohol or use drugs. Allergies:  Allergies  Allergen Reactions  . Iohexol     hives  . Ivp Dye [Iodinated Diagnostic Agents]     hives   Medications Prior to Admission  Medication Sig Dispense Refill  . cloNIDine (CATAPRES) 0.2 MG tablet Take 1 tablet (0.2 mg total) by mouth 3 (three) times daily. 90 tablet 0  . furosemide (LASIX) 80 MG tablet Take 80 mg by mouth daily.     . insulin NPH-regular (NOVOLIN 70/30) (70-30) 100 UNIT/ML injection  Inject into the skin. 52 units in the morning and 32 units in the evening    . losartan-hydrochlorothiazide (HYZAAR) 100-12.5 MG per tablet Take 1 tablet by mouth daily.    . metFORMIN (GLUCOPHAGE) 1000 MG tablet Take 1,000 mg by mouth 2 (two) times daily with a meal.    . metoprolol tartrate (LOPRESSOR) 25 MG tablet Take 25 mg by mouth 2 (two) times daily.    . rivaroxaban (XARELTO) 20 MG TABS tablet Take 20 mg by mouth daily.    Marland Kitchen triamcinolone ointment (KENALOG) 0.1  % Apply 1 application topically 2 (two) times daily as needed (rash/irritation on legs).     . cephALEXin (KEFLEX) 500 MG capsule Take 1 capsule (500 mg total) by mouth 3 (three) times daily. (Patient not taking: Reported on 07/12/2014) 24 capsule 0  . hydrALAZINE (APRESOLINE) 25 MG tablet Take 1 tablet (25 mg total) by mouth 3 (three) times daily. (Patient not taking: Reported on 03/28/2016) 90 tablet 1  . hydrOXYzine (ATARAX/VISTARIL) 25 MG tablet Take 1 tablet (25 mg total) by mouth 3 (three) times daily as needed for itching (refractory to benadryl). (Patient not taking: Reported on 07/12/2014) 30 tablet 0  . ondansetron (ZOFRAN ODT) 4 MG disintegrating tablet Take 1 tablet (4 mg total) by mouth every 6 (six) hours as needed for nausea or vomiting. (Patient not taking: Reported on 03/28/2016) 12 tablet 0    Home: Home Living Family/patient expects to be discharged to:: Private residence Living Arrangements: Alone Available Help at Discharge: Family, Available PRN/intermittently Type of Home: Apartment Home Access: Stairs to enter [employer] of Steps: 18 steps "have to go up then down to get into my apartment" Can enter through sliding door without any steps, but someone has to be with him to go into apartment and unlock slilding patio door. Entrance Stairs-Rails: Right, Left Home Layout: One level Bathroom Shower/Tub: Tub/shower unit, Architectural technologist: Handicapped height Bathroom Accessibility: Yes Home Equipment: None  Functional History: Prior Function Comments: doesn't drive, pt with hx of decreased vision L eye Functional Status:  Mobility:     Ambulation/Gait Ambulation Distance (Feet): 30 Feet General Gait Details: Pt with decreased R step length and difficulty coordinating RW and steps with narrow BOS.  Posterior tendency and cues for forward weight shift.     ADL: ADL Grooming Details (indicate cue type and reason): difficulty with manipulating  objects  Cognition: Cognition Overall Cognitive Status: Within Functional Limits for tasks assessed Orientation Level: Oriented X4 Cognition Arousal/Alertness: Awake/alert Behavior During Therapy: WFL for tasks assessed/performed Overall Cognitive Status: Within Functional Limits for tasks assessed  Blood pressure (!) 161/72, pulse 76, temperature 99 F (37.2 C), temperature source Oral, resp. rate 16, height 5\' 9"  (1.753 m), weight 111.1 kg (245 lb), SpO2 95 %. Physical Exam  Nursing note and vitals reviewed. Constitutional: He is oriented to person, place, and time. He appears well-developed and well-nourished.  HENT:  Head: Normocephalic and atraumatic.  Eyes: EOM are normal.  Decreased visual acuity, right eye, blind in left eye  Neck: Normal range of motion. Neck supple. JVD present. No tracheal deviation present.  Cardiovascular: Normal rate, regular rhythm and normal heart sounds.   No murmur heard. Respiratory: Effort normal and breath sounds normal. No respiratory distress. He has no wheezes.  GI: Soft. Bowel sounds are normal. He exhibits no distension. There is no tenderness.  Neurological: He is alert and oriented to person, place, and time. No sensory deficit. Coordination and gait abnormal.  4 plus, right deltoid, bicep, triceps, grip, hip flexion, extensor, ankle dorsiflexor 5/5 left deltoid, bicep press up, grip, hip flexion, knee extension, ankle dorsi flexion. Mild dysmetria finger-nose-finger on the right Sensation intact to light touch bilateral upper and lower limbs    Results for orders placed or performed during the hospital encounter of 03/28/16 (from the past 24 hour(s))  Glucose, capillary     Status: Abnormal   Collection Time: 03/31/16  4:43 PM  Result Value Ref Range   Glucose-Capillary 292 (H) 65 - 99 mg/dL  Glucose, capillary     Status: Abnormal   Collection Time: 03/31/16  9:45 PM  Result Value Ref Range   Glucose-Capillary 263 (H) 65 - 99  mg/dL   Comment 1 Notify RN    Comment 2 Document in Chart   Basic metabolic panel     Status: Abnormal   Collection Time: 04/01/16  2:21 AM  Result Value Ref Range   Sodium 139 135 - 145 mmol/L   Potassium 3.5 3.5 - 5.1 mmol/L   Chloride 105 101 - 111 mmol/L   CO2 26 22 - 32 mmol/L   Glucose, Bld 240 (H) 65 - 99 mg/dL   BUN 18 6 - 20 mg/dL   Creatinine, Ser 1.49 (H) 0.61 - 1.24 mg/dL   Calcium 9.0 8.9 - 10.3 mg/dL   GFR calc non Af Amer 49 (L) >60 mL/min   GFR calc Af Amer 57 (L) >60 mL/min   Anion gap 8 5 - 15  Magnesium     Status: None   Collection Time: 04/01/16  2:21 AM  Result Value Ref Range   Magnesium 1.9 1.7 - 2.4 mg/dL  Glucose, capillary     Status: Abnormal   Collection Time: 04/01/16  6:33 AM  Result Value Ref Range   Glucose-Capillary 215 (H) 65 - 99 mg/dL   Comment 1 Notify RN    Comment 2 Document in Chart    Mr Brain Limited Wo Contrast  Result Date: 03/30/2016 CLINICAL DATA:  Initial evaluation for left-sided numbness. EXAM: MRI HEAD WITHOUT CONTRAST TECHNIQUE: Multiplanar, multiecho pulse sequences of the brain and surrounding structures were obtained without intravenous contrast. COMPARISON:  Comparison made with prior MRI from 03/28/2016. FINDINGS: Limited thin-section DWI sequences were performed through the brainstem. Diffusion-weighted imaging demonstrates a small acute ischemic infarct within the left paramedian pons, measuring 9 mm (series 3, image 15). This was not clearly evident on previous MRI. No other evidence for acute or subacute ischemia on the limited images provided. Adjacent remote lacunar infarcts noted within the brainstem. IMPRESSION: 1. 9 mm acute ischemic infarct within the left paramedian pons. 2. Please note that this is a limited study with only thin section diffusion-weighted imaging performed through the brainstem. Electronically Signed   By: Jeannine Boga M.D.   On: 03/30/2016 23:12    Assessment/Plan: Diagnosis: Left  paramedian pontine infarct with right hemiataxia 1. Does the need for close, 24 hr/day medical supervision in concert with the patient's rehab needs make it unreasonable for this patient to be served in a less intensive setting? Yes 2. Co-Morbidities requiring supervision/potential complications: Blindness left eye, diabetes, CHF 3. Due to bladder management, bowel management, safety, skin/wound care, disease management, medication administration, pain management and patient education, does the patient require 24 hr/day rehab nursing? Yes 4. Does the patient require coordinated care of a physician, rehab nurse, PT (1-2 hrs/day, 5 days/week), OT (1-2 hrs/day, 5 days/week) and SLP (.5-1 hrs/day, 5 days/week) to  address physical and functional deficits in the context of the above medical diagnosis(es)? Yes Addressing deficits in the following areas: balance, endurance, locomotion, strength, transferring, bowel/bladder control, bathing, dressing, feeding, grooming, toileting and cognition 5. Can the patient actively participate in an intensive therapy program of at least 3 hrs of therapy per day at least 5 days per week? Yes 6. The potential for patient to make measurable gains while on inpatient rehab is good 7. Anticipated functional outcomes upon discharge from inpatients are pre gait, gait training, endurance , safety, equipment, neuromuscular re education PT, ADLs, Cognitive perceptual skills, Neuromuscular re education, safety, endurance, equipment OT, a recognition SLP 8. Estimated rehab length of stay to reach the above functional goals is: 14-17d 9. Does the patient have adequate social supports to accommodate these discharge functional goals? Yes 10. Anticipated D/C setting: Home 11. Anticipated post D/C treatments: [place] therapy 12. Overall Rehab/Functional Prognosis: good  RECOMMENDATIONS: This patient's condition is appropriate for continued rehabilitative care in the following setting:  CIR Patient has agreed to participate in recommended program. Yes Note that insurance prior authorization may be required for reimbursement for recommended care.  Comment:   Charlett Blake 04/01/2016

## 2016-04-02 DIAGNOSIS — I10 Essential (primary) hypertension: Secondary | ICD-10-CM

## 2016-04-02 DIAGNOSIS — I420 Dilated cardiomyopathy: Secondary | ICD-10-CM

## 2016-04-02 LAB — BASIC METABOLIC PANEL
Anion gap: 7 (ref 5–15)
BUN: 18 mg/dL (ref 6–20)
CHLORIDE: 104 mmol/L (ref 101–111)
CO2: 26 mmol/L (ref 22–32)
Calcium: 8.9 mg/dL (ref 8.9–10.3)
Creatinine, Ser: 1.36 mg/dL — ABNORMAL HIGH (ref 0.61–1.24)
GFR calc Af Amer: >60 mL/min (ref >60)
GFR calc non Af Amer: 55 mL/min — ABNORMAL LOW (ref >60)
Glucose, Bld: 195 mg/dL — ABNORMAL HIGH (ref 65–99)
Potassium: 3.7 mmol/L (ref 3.5–5.1)
SODIUM: 137 mmol/L (ref 135–145)

## 2016-04-02 LAB — GLUCOSE, CAPILLARY
GLUCOSE-CAPILLARY: 177 mg/dL — AB (ref 65–99)
GLUCOSE-CAPILLARY: 184 mg/dL — AB (ref 65–99)
Glucose-Capillary: 216 mg/dL — ABNORMAL HIGH (ref 65–99)
Glucose-Capillary: 204 mg/dL — ABNORMAL HIGH (ref 65–99)

## 2016-04-02 MED ORDER — SENNOSIDES-DOCUSATE SODIUM 8.6-50 MG PO TABS
1.0000 | ORAL_TABLET | Freq: Every day | ORAL | Status: DC
  Administered 2016-04-02 – 2016-04-03 (×2): 1 via ORAL
  Filled 2016-04-02 (×2): qty 1

## 2016-04-02 MED ORDER — CLONIDINE HCL 0.1 MG PO TABS
0.3000 mg | ORAL_TABLET | Freq: Three times a day (TID) | ORAL | Status: DC
  Administered 2016-04-02 – 2016-04-03 (×4): 0.3 mg via ORAL
  Filled 2016-04-02 (×4): qty 3

## 2016-04-02 NOTE — Progress Notes (Signed)
Inpatient Rehabilitation  I met with the patient at the bedside to discuss the recommendation for IP Rehab.  I provided informational booklets.  I am not sure pt. Is able to fully  understand about the IP rehab program and the broader scope of discharge planning.   With patient's permission, I  placed a call to his daughter Kyree Adriano. I explained to [name] that we anticipate pt. will need 24 hour care following a potential CIR stay.   She states "I guess the family will just have to rally to see what we can provide to take care of my father". She has requested some time to discuss the situation with her younger sister as well as pt's sisters.  We have planned to speak again in the am with the family's decision.  I have updated [place], CSW who is planning a SNF back up in the event pt. does not come to CIR for any reason.  Please call if questions.  [facility] Admissions Coordinator Cell [phone] Office [phone]

## 2016-04-02 NOTE — Progress Notes (Signed)
Physical Therapy Progress Note  Assessment: Pt progressing towards physical therapy goals. Continues to require +2 assist and cueing for compensation techniques for R sided weakness during transfers. Was not able to progress to gait training this session due to heavy anterior lean and level of assist required for sit<>stand. Will continue to follow and progress as able per POC.    04/02/16 0926  PT Visit Information  Last PT Received On 04/02/16  Assistance Needed +2  PT/OT/SLP Co-Evaluation/Treatment Yes  Reason for Co-Treatment Complexity of the patient's impairments (multi-system involvement);For patient/therapist safety;To address functional/ADL transfers  PT goals addressed during session Mobility/safety with mobility;Balance;Proper use of DME  History of Present Illness [name] is a 61 y.o. male with history of diabetes mellitus type 2, hypertension, CHF, DVT presents to ER because of increasing numbness of the right upper and lower extremity. MRI brain showed no acute stroke. MRA of head and neck showed right vertebral artery occlusion.  Subjective Data  Subjective Pt reports concern with R sided weakness.   Patient Stated Goal return to his independence  Precautions  Precautions Fall  Restrictions  Weight Bearing Restrictions No  Pain Assessment  Pain Assessment No/denies pain  Cognition  Arousal/Alertness Awake/alert  Behavior During Therapy WFL for tasks assessed/performed  Overall Cognitive Status Impaired/Different from baseline  Area of Impairment Following commands;Safety/judgement;Awareness;Problem solving  Following Commands Follows one step commands with increased time;Follows multi-step commands inconsistently  Safety/Judgement Decreased awareness of safety;Decreased awareness of deficits  Awareness Emergent  Problem Solving Slow processing;Decreased initiation;Requires verbal cues;Requires tactile cues  Bed Mobility  Overal bed mobility Needs Assistance  Bed  Mobility Supine to Sit  Supine to sit Supervision  General bed mobility comments Pt was able to transition to EOB with no physical assistance required. HOB slightly elevated and rails utilized. Increased time for full scoot out to EOB.  Transfers  Overall transfer level Needs assistance  Equipment used Rolling walker (2 wheeled);2 person hand held assist  Transfers Sit to/from [employer] Transfers  Sit to Stand Max assist;+2 physical assistance;From elevated surface  Stand pivot transfers Mod assist;+2 physical assistance;From elevated surface  General transfer comment Pt stood x2 with RW for support and +2 max assist for balance support. Significant anterior lean noted with cues required for upright posture. SPT was completed with +2 mod assist and no walker. Pt was able to take pivotal steps around to the chair.   Ambulation/Gait  General Gait Details Pt was unsafe to attempt at this time.   Modified Rankin (Stroke Patients Only)  Pre-Morbid Rankin Score 0  Modified Rankin 5  Balance  Overall balance assessment Needs assistance  Sitting-balance support Bilateral upper extremity supported  Sitting balance-Leahy Scale Poor  Standing balance support Bilateral upper extremity supported;During functional activity  Standing balance-Leahy Scale Zero  Standing balance comment +2 required  Exercises  Exercises General Lower Extremity  General Exercises - Lower Extremity  Long Arc Quad 10 reps;Both  PT - End of Session  Equipment Utilized During Treatment Gait belt  Activity Tolerance Patient limited by fatigue  Patient left in chair;with call bell/phone within reach;with chair alarm set;with family/visitor present  Nurse Communication Mobility status  PT - Assessment/Plan  PT Plan Current plan remains appropriate  PT Frequency (ACUTE ONLY) Min 3X/week  Follow Up Recommendations CIR  PT equipment Rolling walker with 5" wheels  PT Goal Progression  Progress towards PT goals  Progressing toward goals  Acute Rehab PT Goals  PT Goal Formulation With patient  Time For Goal Achievement 04/13/16  Potential to Achieve Goals Good  PT Time Calculation  PT Start Time (ACUTE ONLY) 0840  PT Stop Time (ACUTE ONLY) 0910  PT Time Calculation (min) (ACUTE ONLY) 30 min  PT General Charges  $$ ACUTE PT VISIT 1 Procedure  PT Treatments  $Gait Training 8-22 mins   Rolinda Roan, PT, DPT Acute Rehabilitation Services Pager: [phone]

## 2016-04-02 NOTE — NC FL2 (Signed)
[place] MEDICAID FL2 LEVEL OF CARE SCREENING TOOL     IDENTIFICATION  Patient Name: [name] Birthdate: May 15, 1955 Sex: male Admission Date (Current Location): 03/28/2016  [facility] and [place] Number:  Herbalist and Address:  [facility], [place] [address], [place], [place] [address]      Provider Number: [id number]  Attending Physician Name and Address:  Donne Hazel, MD  Relative Name and Phone Number:       Current Level of Care: Hospital Recommended Level of Care: [place] Prior Approval Number:    Date Approved/Denied:   PASRR Number: [id number]  Discharge Plan: SNF    Current Diagnoses: Patient Active Problem List   Diagnosis Date Noted  . Brainstem stroke ([place]) 04/01/2016  . Abnormality of gait and mobility   . Cerebral infarction due to thrombosis of other cerebral artery ([place])   . Right arm numbness   . Numbness on right side 03/29/2016  . TIA (transient ischemic attack) 03/29/2016  . Hypertensive urgency 03/29/2016  . Sepsis ([place]) 09/10/2013  . DVT (deep venous thrombosis) ([place]) 01/01/2013  . Rash and nonspecific skin eruption 12/29/2012  . Cellulitis and abscess of leg 12/29/2012  . Diabetes type 2, uncontrolled ([place]) 12/29/2012  . Hypertension   . Cardiomyopathy ([place])   . CHF (congestive heart failure) ([place])   . Peripheral neuropathy ([place])   . Special screening for malignant neoplasms, colon 12/09/2012    Orientation RESPIRATION BLADDER Height & Weight     Self, Time, Situation, Place  Normal Incontinent Weight: 245 lb (111.1 kg) Height:  5\' 9"  (175.3 cm)  BEHAVIORAL SYMPTOMS/MOOD NEUROLOGICAL BOWEL NUTRITION STATUS      Continent Diet (carb modified)  AMBULATORY STATUS COMMUNICATION OF NEEDS Skin   Extensive Assist Verbally Normal                       Personal Care Assistance Level of Assistance  Bathing, Dressing Bathing Assistance: Maximum assistance   Dressing Assistance:  Maximum assistance     Functional Limitations Info             SPECIAL CARE FACTORS FREQUENCY  PT (By licensed PT), OT (By licensed OT)     PT Frequency: 5/wk OT Frequency: 5/wk            Contractures      Additional Factors Info  Code Status, Allergies Code Status Info: FULL Allergies Info: Iohexol, Ivp Dye Iodinated Diagnostic Agents           Current Medications (04/02/2016):  This is the current hospital active medication list Current Facility-Administered Medications  Medication Dose Route Frequency Provider Last Rate Last Dose  .  stroke: mapping our early stages of recovery book   Does not apply Once Rise Patience, MD      . acetaminophen (TYLENOL) tablet 650 mg  650 mg Oral Q4H PRN Rise Patience, MD   650 mg at 03/30/16 1943   Or  . acetaminophen (TYLENOL) solution 650 mg  650 mg Per Tube Q4H PRN Rise Patience, MD       Or  . acetaminophen (TYLENOL) suppository 650 mg  650 mg Rectal Q4H PRN Rise Patience, MD      . atorvastatin (LIPITOR) tablet 40 mg  40 mg Oral q1800 David L Rinehuls, PA-C   40 mg at 04/01/16 1758  . bismuth subsalicylate (PEPTO BISMOL) 262 MG/15ML suspension 30 mL  30 mL  Oral Q4H PRN Oswald Hillock, MD   30 mL at 03/29/16 1222  . carvedilol (COREG) tablet 25 mg  25 mg Oral BID WC Arbutus Leas, NP   25 mg at 04/02/16 0818  . chlorthalidone (HYGROTON) tablet 25 mg  25 mg Oral Daily Arbutus Leas, NP   25 mg at 04/02/16 1011  . cloNIDine (CATAPRES) tablet 0.3 mg  0.3 mg Oral TID Donne Hazel, MD      . furosemide (LASIX) tablet 80 mg  80 mg Oral Daily Rise Patience, MD   80 mg at 04/02/16 1011  . insulin aspart (novoLOG) injection 0-9 Units  0-9 Units Subcutaneous TID WC Rise Patience, MD   2 Units at 04/02/16 1219  . insulin aspart protamine- aspart (NOVOLOG MIX 70/30) injection 35 Units  35 Units Subcutaneous Q supper Donne Hazel, MD   35 Units at 04/01/16 1800  . insulin aspart protamine- aspart (NOVOLOG  MIX 70/30) injection 60 Units  60 Units Subcutaneous Q breakfast Donne Hazel, MD   60 Units at 04/02/16 0820  . isosorbide-hydrALAZINE (BIDIL) 20-37.5 MG per tablet 2 tablet  2 tablet Oral TID Minus Breeding, MD   2 tablet at 04/02/16 1012  . losartan (COZAAR) tablet 100 mg  100 mg Oral Daily Rise Patience, MD   100 mg at 04/02/16 1012  . rivaroxaban (XARELTO) tablet 20 mg  20 mg Oral Q supper Berton Mount, RPH   20 mg at 04/01/16 1800  . senna-docusate (Senokot-S) tablet 1 tablet  1 tablet Oral Daily Donne Hazel, MD   1 tablet at 04/02/16 1220     Discharge Medications: Please see discharge summary for a list of discharge medications.  Relevant Imaging Results:  Relevant Lab Results:   Additional Information SS#: [id number]  Jorge Ny, LCSW

## 2016-04-02 NOTE — Evaluation (Signed)
Occupational Therapy Evaluation Patient Details Name: [name] MRN: [place]:[id number] DOB: [birth year] Today's Date: 04/02/2016    History of Present Illness  [name] is a 61 y.o. male with history of diabetes mellitus type 2, hypertension, CHF, DVT presents to ER because of increasing numbness of the right upper and lower extremity. MRI brain showed no acute stroke. MRA of head and neck showed right vertebral artery occlusion.   Clinical Impression   Pt progressing toward OT goals. Educated pt on R UE HEP to address fine and gross motor control and he reports understanding. Plan to continue to progress with this on previous sessions. Educated pt and daughter on compensatory dressing techniques. He continues to require verbal, visual, and tactile cues for sequencing of ADL tasks with increased time to process. Pt able to complete stand-pivot toilet transfer with mod assist +2 this session without RW and required max assist +2 for sit<>stand with RW in preparation for ADL tasks. OT will continue to follow acutely. D/C plan remains appropriate.    Follow Up Recommendations  CIR;Supervision/Assistance - 24 hour    Equipment Recommendations  3 in 1 bedside commode;Tub/shower bench    Recommendations for Other Services Rehab consult     Precautions / Restrictions Precautions Precautions: Fall Restrictions Weight Bearing Restrictions: No      Mobility Bed Mobility Overal bed mobility: Needs Assistance Bed Mobility: Supine to Sit     Supine to sit: Supervision     General bed mobility comments: No physical assistance required to transition to EOB. He did demonstrate heavy use of bed rails and required increased time.  Transfers Overall transfer level: Needs assistance Equipment used: Rolling walker (2 wheeled);2 person hand held assist Transfers: Sit to/from [employer] Sit to Stand: Max assist;+2 physical assistance;From elevated surface Stand pivot transfers:  Mod assist;+2 physical assistance;From elevated surface       General transfer comment: Pt stood x2 with RW for support and +2 max assist for balance support. Significant anterior lean noted with cues required for upright posture. SPT was completed with +2 mod assist and no walker. Pt was able to take pivotal steps around to the chair.     Balance Overall balance assessment: Needs assistance Sitting-balance support: Bilateral upper extremity supported Sitting balance-Leahy Scale: Fair Sitting balance - Comments: Able to statically sit briefly without UE support.    Standing balance support: Bilateral upper extremity supported;During functional activity Standing balance-Leahy Scale: Zero Standing balance comment: +2 required                            ADL Overall ADL's : Needs assistance/impaired Eating/Feeding: Set up;Supervision/ safety;Sitting               Upper Body Dressing : Minimal assistance;Cueing for sequencing Upper Body Dressing Details (indicate cue type and reason): VC's required for problem solving through task as well as for hemidressing techniques. Lower Body Dressing: Maximal assistance;Sit to/from stand   Toilet Transfer: Moderate assistance;+2 for physical assistance;Stand-pivot Toilet Transfer Details (indicate cue type and reason): Attempted sit<>stand for ambulation x3 with max assist +2. Pt unsafe to attempt ambulation but was able to complete simulated stand-pivot toilet transfer with mod assist +2.         Functional mobility during ADLs: +2 for physical assistance General ADL Comments: VC's for hemidressing technique.  Continues to require verbal, visual, and tactile cues for sequencing of tasks.     Vision Vision  Assessment?: Vision impaired- to be further tested in functional context   Perception     Praxis      Pertinent Vitals/Pain Pain Assessment: No/denies pain     Hand Dominance     Extremity/Trunk Assessment              Communication     Cognition Arousal/Alertness: Awake/alert Behavior During Therapy: WFL for tasks assessed/performed Overall Cognitive Status: Impaired/Different from baseline Area of Impairment: Following commands;Safety/judgement;Awareness;Problem solving       Following Commands: Follows one step commands with increased time Safety/Judgement: Decreased awareness of safety Awareness: Emergent Problem Solving: Slow processing;Difficulty sequencing General Comments: Pt with good awareness of deficits and able to verbalize problems as they occur. Continues to require VC's for sequencing and increased time for processing.   General Comments       Exercises Exercises: Hand exercises;General Upper Extremity     Shoulder Instructions      Home Living                                          Prior Functioning/Environment                   OT Problem List:     OT Treatment/Interventions:      OT Goals(Current goals can be found in the care plan section) Acute Rehab OT Goals Patient Stated Goal: return to his independence OT Goal Formulation: With patient Time For Goal Achievement: 04/13/16 Potential to Achieve Goals: Good ADL Goals Pt Will Perform Grooming: with set-up;sitting Pt Will Perform Upper Body Bathing: with set-up;sitting Pt Will Perform Lower Body Bathing: with min assist;sit to/from stand Pt Will Transfer to Toilet: with min assist;bedside commode;ambulating Pt Will Perform Toileting - Clothing Manipulation and hygiene: with supervision;sit to/from stand Pt/caregiver will Perform Home Exercise Program: With Supervision;With written HEP provided;Right Upper extremity (fine motor/coordination)  OT Frequency: Min 3X/week   Barriers to D/C:            Co-evaluation PT/OT/SLP Co-Evaluation/Treatment: Yes Reason for Co-Treatment: For patient/therapist safety;To address functional/ADL transfers;Complexity of the patient's  impairments (multi-system involvement)   OT goals addressed during session: ADL's and self-care      End of Session Equipment Utilized During Treatment: Gait belt;Rolling walker Nurse Communication: Mobility status  Activity Tolerance: Patient tolerated treatment well Patient left: in bed;with call bell/phone within reach;with bed alarm set   Time: [id number] OT Time Calculation (min): 30 min Charges:  OT General Charges $OT Visit: 1 Procedure OT Treatments $Self Care/Home Management : 8-22 mins G-Codes:    Norman Herrlich, OTR/L [phone] 04/02/2016, 9:39 AM

## 2016-04-02 NOTE — Progress Notes (Signed)
Progress Note  Patient Name: [name] Date of Encounter: 04/02/2016  Primary Cardiologist: Hochrein  Subjective   No chest pain or SOB.     Inpatient Medications    Scheduled Meds: .  stroke: mapping our early stages of recovery book   Does not apply Once  . atorvastatin  40 mg Oral q1800  . carvedilol  25 mg Oral BID WC  . chlorthalidone  25 mg Oral Daily  . cloNIDine  0.2 mg Oral TID  . furosemide  80 mg Oral Daily  . insulin aspart  0-9 Units Subcutaneous TID WC  . insulin aspart protamine- aspart  35 Units Subcutaneous Q supper  . insulin aspart protamine- aspart  60 Units Subcutaneous Q breakfast  . isosorbide-hydrALAZINE  2 tablet Oral TID  . losartan  100 mg Oral Daily  . rivaroxaban  20 mg Oral Q supper   Continuous Infusions:  PRN Meds: acetaminophen **OR** acetaminophen (TYLENOL) oral liquid 160 mg/5 mL **OR** acetaminophen, bismuth subsalicylate   Vital Signs    Vitals:   04/01/16 1411 04/01/16 2207 04/02/16 0500 04/02/16 0848  BP: (!) 160/66 (!) 121/43 (!) 166/55 (!) 191/70  Pulse: 68 66 63 70  Resp: 16 16 16 20   Temp: 98.9 F (37.2 C) 99.2 F (37.3 C) 98.7 F (37.1 C) 98.5 F (36.9 C)  TempSrc: Oral Oral Oral Oral  SpO2: 98% 98% 97% 97%  Weight:      Height:        Intake/Output Summary (Last 24 hours) at 04/02/16 0919 Last data filed at 04/02/16 0843  Gross per 24 hour  Intake              240 ml  Output              675 ml  Net             -435 ml   Filed Weights   03/28/16 2044  Weight: 245 lb (111.1 kg)    Telemetry    Sinus.  Personally Reviewed  Physical Exam   GEN: No acute distress.   Neck: No JVD Cardiac: RRR, no murmurs, rubs, or gallops.  Respiratory: Clear to auscultation bilaterally. GI: Soft, nontender, non-distended  Ext: No edema; No deformity. Neuro:  Nonfocal  Psych: Normal affect   Labs    Chemistry  Recent Labs Lab 03/28/16 2113  03/31/16 0329 04/01/16 0221 04/02/16 0251  NA 139  < > 142 139  137  K 3.3*  < > 3.2* 3.5 3.7  CL 103  < > 106 105 104  CO2 26  --  26 26 26   GLUCOSE 165*  < > 113* 240* 195*  BUN 18  < > 16 18 18   CREATININE 1.18  < > 1.52* 1.49* 1.36*  CALCIUM 8.9  --  9.2 9.0 8.9  PROT 7.7  --   --   --   --   ALBUMIN 3.8  --   --   --   --   AST 24  --   --   --   --   ALT 17  --   --   --   --   ALKPHOS 61  --   --   --   --   BILITOT 0.6  --   --   --   --   GFRNONAA >60  --  48* 49* 55*  GFRAA >60  --  56* 57* >60  ANIONGAP 10  --  10 8 7   < > = values in this interval not displayed.   Hematology  Recent Labs Lab 03/28/16 2113 03/28/16 2121  WBC 5.9  --   RBC 5.06  --   HGB 14.6 15.3  HCT 43.3 45.0  MCV 85.6  --   MCH 28.9  --   MCHC 33.7  --   RDW 12.7  --   PLT 210  --     Cardiac Enzymes  Recent Labs Lab 03/29/16 0931  TROPONINI 0.14*     Recent Labs Lab 03/28/16 2119  TROPIPOC 0.07      Radiology    Cardiac Studies   ECHO:   Left ventricle: The cavity size was mildly dilated. Wall   thickness was increased in a pattern of mild LVH. Systolic   function was severely reduced. The estimated ejection fraction   was in the range of 25% to 30%. Diffuse hypokinesis. There is   akinesis of the inferolateral myocardium. Doppler parameters are   consistent with abnormal left ventricular relaxation (grade 1   diastolic dysfunction). Doppler parameters are consistent with   high ventricular filling pressure. - Left atrium: The atrium was mildly dilated. - Right ventricle: Systolic function was moderately reduced.  Patient Profile     61 year old male with a past medical history of chronic systolic CHF, ischemic cardiomyopathy,DM, DVT (on Xarelto), HTN, and HLD. He was admitted with an acute CVA, numbness of the right upper and lower extremity and found to have a right vertebral artery occlusion, also with elevated troponin and reduced EF and cardiology was consulted.   Assessment & Plan    1. Non-ischemic cardiomyopathy/Acute  systolic CHF: He has no evidence of acute coronary syndrome. Volume status is ok. As per plans of cardiology over the weekend, will plan outpatient stress test. This will not need to be done while he is is an inpatient. He is now on Bidil and Coreg.  He is also on Lasix.    2. HTN:   BP is still elevated. He is on Losartan, Chlorthalidone, clonidine, Bidil and coreg. Would increase Clonidine.    3. Stroke: He is on Xarelto.   Signed, Lauree Chandler, MD  04/02/2016, 9:19 AM

## 2016-04-02 NOTE — Progress Notes (Signed)
PROGRESS NOTE    [name]  [id number] DOB: [birth year] DOA: 03/28/2016 PCP: [name], MD    Brief Narrative:  61 y.o.malewith history of diabetes mellitus type 2, hypertension, CHF, DVT presents to ER because of increasing numbness of the right upper and lower extremity. Patient's symptoms started around 7 PM last evening. Denies any associated weakness of extremities or any difficulty swallowing or speaking or visual symptoms. CT of the head was unremarkable. On-call neurologist Dr. Jacqulyn Liner consulted and requested patient be transferred to [facility]. MRI of the brain and MRA of the head and neck was showing no acute infarct but did show occlusion of the right vertebral artery. On my exam patient still complains of right upper and lower extremity numbness but no obvious weakness. Patient's blood pressure is also markedly elevated on presentation which improved without any intervention.   Assessment & Plan:   Principal Problem:   TIA (transient ischemic attack) Active Problems:   Diabetes type 2, uncontrolled (HCC)   Cardiomyopathy ([place])   DVT (deep venous thrombosis) (HCC)   Numbness on right side   Hypertensive urgency   Right arm numbness   Cerebral infarction due to thrombosis of other cerebral artery (HCC)   Brainstem stroke (HCC)   Abnormality of gait and mobility  1. Acute L Paramedian Pons CVA 1. Case was discussed with Dr. Cheral Marker prior to transfer to [facility] 2. Repeat MRI confirming acute 36mm L paramedian pons infarct 3. Patient is continued on xarelto for secondary stroke prevention 4. Remains stable at present 2. Hypertensive urgency 1. Patient is continued on clonidine, hydralazine, cozaar 2. Imdur changed to bidil per Cardiology 3. BP currently stable, albeit suboptimal 4. Will increase clonidine to 0.3mg  tid 3. History of DVT  1. Reportedly missed his xarelto dose last 3 days prior to admission.  2. Xarelto is continued 3. Remains  stable currently 4. Diabetes mellitus type 2  1. patient is on NovoLog 70/30 twice a day.  2. Glucose in the mid-100's.  3. Continue insulin as tolerated for now 4. Continue to titrate insulin as needed  5. History of CHF  1. last EF measured in 2014 was 60-65% with grade 1 diastolic dysfunction - appears compensated. On Lasix. 2. Repeat 2d echo results reviewed with EF of 25-30%. 3. Cardiology following, with recs for perfusion study as outpatient 4. Remains stable  DVT prophylaxis: Xarelto Code Status: Full Family Communication: Pt in room, family not at bedside Disposition Plan: Uncertain at this time  Consultants:   Neurology  Cardiology  CIR  Procedures:     Antimicrobials: Anti-infectives    None      Subjective: Without complaints  Objective: Vitals:   04/01/16 2207 04/02/16 0500 04/02/16 0848 04/02/16 1305  BP: (!) 121/43 (!) 166/55 (!) 191/70 (!) 120/49  Pulse: 66 63 70 67  Resp: 16 16 20 20   Temp: 99.2 F (37.3 C) 98.7 F (37.1 C) 98.5 F (36.9 C) 98.3 F (36.8 C)  TempSrc: Oral Oral Oral Oral  SpO2: 98% 97% 97% 96%  Weight:      Height:        Intake/Output Summary (Last 24 hours) at 04/02/16 1557 Last data filed at 04/02/16 1305  Gross per 24 hour  Intake              240 ml  Output              475 ml  Net             -  235 ml   Filed Weights   03/28/16 2044  Weight: 111.1 kg (245 lb)    Examination:  General exam: Laying in bed, in nad Respiratory system: normal chest rise, no audible wheezing Cardiovascular system: regular rate, s1-s2 Gastrointestinal system: soft, nondistended, pos BS Central nervous system: no seizures, no tremors Extremities: no clubbing, no joint deformities Skin: normal skin turgor, no notable skin lesions seen Psychiatry: mood normal// no visual hallucinations .   Data Reviewed: I have personally reviewed following labs and imaging studies  CBC:  Recent Labs Lab 03/28/16 2113 03/28/16 2121  WBC  5.9  --   NEUTROABS 2.5  --   HGB 14.6 15.3  HCT 43.3 45.0  MCV 85.6  --   PLT 210  --    Basic Metabolic Panel:  Recent Labs Lab 03/28/16 2113 03/28/16 2121 03/31/16 0329 04/01/16 0221 04/02/16 0251  NA 139 143 142 139 137  K 3.3* 3.4* 3.2* 3.5 3.7  CL 103 102 106 105 104  CO2 26  --  26 26 26   GLUCOSE 165* 164* 113* 240* 195*  BUN 18 18 16 18 18   CREATININE 1.18 1.30* 1.52* 1.49* 1.36*  CALCIUM 8.9  --  9.2 9.0 8.9  MG  --   --   --  1.9  --    GFR: Estimated Creatinine Clearance: 71 mL/min (by C-G formula based on SCr of 1.36 mg/dL (H)). Liver Function Tests:  Recent Labs Lab 03/28/16 2113  AST 24  ALT 17  ALKPHOS 61  BILITOT 0.6  PROT 7.7  ALBUMIN 3.8   No results for input(s): LIPASE, AMYLASE in the last 168 hours. No results for input(s): AMMONIA in the last 168 hours. Coagulation Profile:  Recent Labs Lab 03/28/16 2113  INR 0.92   Cardiac Enzymes:  Recent Labs Lab 03/29/16 0931  TROPONINI 0.14*   BNP (last 3 results) No results for input(s): PROBNP in the last 8760 hours. HbA1C: No results for input(s): HGBA1C in the last 72 hours. CBG:  Recent Labs Lab 04/01/16 1110 04/01/16 1636 04/01/16 2123 04/02/16 0637 04/02/16 1121  GLUCAP 306* 279* 224* 177* 184*   Lipid Profile: No results for input(s): CHOL, HDL, LDLCALC, TRIG, CHOLHDL, LDLDIRECT in the last 72 hours. Thyroid Function Tests: No results for input(s): TSH, T4TOTAL, FREET4, T3FREE, THYROIDAB in the last 72 hours. Anemia Panel: No results for input(s): VITAMINB12, FOLATE, FERRITIN, TIBC, IRON, RETICCTPCT in the last 72 hours. Sepsis Labs: No results for input(s): PROCALCITON, LATICACIDVEN in the last 168 hours.  No results found for this or any previous visit (from the past 240 hour(s)).   Radiology Studies: No results found.  Scheduled Meds: .  stroke: mapping our early stages of recovery book   Does not apply Once  . atorvastatin  40 mg Oral q1800  . carvedilol  25  mg Oral BID WC  . chlorthalidone  25 mg Oral Daily  . cloNIDine  0.3 mg Oral TID  . furosemide  80 mg Oral Daily  . insulin aspart  0-9 Units Subcutaneous TID WC  . insulin aspart protamine- aspart  35 Units Subcutaneous Q supper  . insulin aspart protamine- aspart  60 Units Subcutaneous Q breakfast  . isosorbide-hydrALAZINE  2 tablet Oral TID  . losartan  100 mg Oral Daily  . rivaroxaban  20 mg Oral Q supper  . senna-docusate  1 tablet Oral Daily   Continuous Infusions:   LOS: 3 days   Shanequa Whitenight, Orpah Melter, MD [facility]  Pager [phone]  If 7PM-7AM, please contact night-coverage [email or web address] Password [facility] 04/02/2016, 3:57 PM

## 2016-04-02 NOTE — H&P (Signed)
Physical Medicine and Rehabilitation Admission H&P    Chief Complaint  Patient presents with  . Left sided weakness with ataxia and worsening of sensory deficits on the right     HPI: [name] is a 61 year old male with history of CAD, with ICM, diastolic CHF, O8NO, peripheral neuropathy, DVT- on Xarelto who was admitted on 03/28/16 with right sided numbness. He reported running out of Xarelto for 3 days PTA.  BP elevated in ED and patient with ST changes in inferolateral leads. UDS negative.  2 D echo done revealing mild LVH with global hypokinesis with akinesis of inferolateral wall  and EF 25- 30% with grade 1. Dr. Warren Lacy recommended outpatient perfusion study and Bidil and coreg added for management of cardiomyopathy.  MRI brain done revealing old bilateral thalamic infarcts, bilateral basal ganglia lacunar infarcts, multiple old pontine and cerebellar infarcts. MRA head/neck done revealing severe stenosis B-ICA with multifocal moderate stenosis posterior circulation and occluded R-VA without significant reconstitution.  MRI cervical spine showed cervical spondylosis with discogenic and facet degenerative changes with right central disc protrusion C3-C4. Neurology consulted for input on severe bilateral peripheral neuropathy with absent sensation to knees. Repeat MRI brain done revealing 9 mm acute ischemic infarct within left paramedian pons. Therapy evaluations done revealing right sided weakness with vision deficits, unsteady gait with inability to advance RLE and ataxic gait. CIR recommended for follow up therapy.    Review of Systems  HENT: Negative for hearing loss and tinnitus.   Eyes:       Blind in left eye and poor vision in right eye  Respiratory: Positive for shortness of breath (with activity--getting worse per family). Negative for cough.   Cardiovascular: Positive for leg swelling. Negative for chest pain and palpitations.  Gastrointestinal: Negative for constipation  and heartburn.  Genitourinary: Negative for frequency and hematuria.  Musculoskeletal: Negative for joint pain and myalgias.  Skin: Negative for rash.  Neurological: Positive for sensory change (RLE/RUE numbness) and focal weakness. Negative for dizziness, tremors and headaches.  Psychiatric/Behavioral: Negative for memory loss. The patient is not nervous/anxious and does not have insomnia.     Past Medical History:  Diagnosis Date  . Adrenal insufficiency ([place])   . Allergy   . Arthritis   . Blind left eye   . Carpal tunnel syndrome   . Cataract    bil removed  . CHF (congestive heart failure) ([place])   . Diabetes mellitus without complication ([place])   . ED (erectile dysfunction)   . Gout   . Herpes   . Hypercholesterolemia   . Hypertension   . MI (myocardial infarction)   . Peripheral neuropathy ([place])   . Pneumonia   . PVD (peripheral vascular disease) ([place])     Past Surgical History:  Procedure Laterality Date  . CHOLECYSTECTOMY    . dilatera cataracts removed    . right knee surgary    . stents      Family History  Problem Relation Age of Onset  . Breast cancer Mother   . Hypertension Father     blood clot in leg  . Diabetes Maternal Aunt   . Heart attack Sister   . Colon cancer Neg Hx   . Esophageal cancer Neg Hx   . Rectal cancer Neg Hx   . Stomach cancer Neg Hx     Social History:  Lives alone. Independent PTA. Daughter lives across the street. He manages home and takes cab for grocery shopping.  Family checks in occasionally. He reports that he has never smoked. He has never used smokeless tobacco. He reports that he does not drink alcohol or use drugs.    Allergies  Allergen Reactions  . Iohexol     hives  . Ivp Dye [Iodinated Diagnostic Agents]     hives    Medications Prior to Admission  Medication Sig Dispense Refill  . cloNIDine (CATAPRES) 0.2 MG tablet Take 1 tablet (0.2 mg total) by mouth 3 (three) times daily. 90 tablet 0  . furosemide  (LASIX) 80 MG tablet Take 80 mg by mouth daily.     . insulin NPH-regular (NOVOLIN 70/30) (70-30) 100 UNIT/ML injection Inject into the skin. 52 units in the morning and 32 units in the evening    . losartan-hydrochlorothiazide (HYZAAR) 100-12.5 MG per tablet Take 1 tablet by mouth daily.    . metFORMIN (GLUCOPHAGE) 1000 MG tablet Take 1,000 mg by mouth 2 (two) times daily with a meal.    . metoprolol tartrate (LOPRESSOR) 25 MG tablet Take 25 mg by mouth 2 (two) times daily.    . rivaroxaban (XARELTO) 20 MG TABS tablet Take 20 mg by mouth daily.    Marland Kitchen triamcinolone ointment (KENALOG) 0.1 % Apply 1 application topically 2 (two) times daily as needed (rash/irritation on legs).     . cephALEXin (KEFLEX) 500 MG capsule Take 1 capsule (500 mg total) by mouth 3 (three) times daily. (Patient not taking: Reported on 07/12/2014) 24 capsule 0  . hydrALAZINE (APRESOLINE) 25 MG tablet Take 1 tablet (25 mg total) by mouth 3 (three) times daily. (Patient not taking: Reported on 03/28/2016) 90 tablet 1  . hydrOXYzine (ATARAX/VISTARIL) 25 MG tablet Take 1 tablet (25 mg total) by mouth 3 (three) times daily as needed for itching (refractory to benadryl). (Patient not taking: Reported on 07/12/2014) 30 tablet 0  . ondansetron (ZOFRAN ODT) 4 MG disintegrating tablet Take 1 tablet (4 mg total) by mouth every 6 (six) hours as needed for nausea or vomiting. (Patient not taking: Reported on 03/28/2016) 12 tablet 0    Home: Home Living Family/patient expects to be discharged to:: Private residence Living Arrangements: Alone Available Help at Discharge: Family, Available PRN/intermittently Type of Home: Apartment Home Access: Stairs to enter [employer] of Steps: 18 steps "have to go up then down to get into my apartment" Can enter through sliding door without any steps, but someone has to be with him to go into apartment and unlock slilding patio door. Entrance Stairs-Rails: Right, Left Home Layout: One  level Bathroom Shower/Tub: Tub/shower unit, Architectural technologist: Handicapped height Bathroom Accessibility: Yes Home Equipment: None   Functional History: Prior Function Level of Independence: Independent Comments: doesn't drive, pt with hx of decreased vision L eye  Functional Status:  Mobility: Bed Mobility Overal bed mobility: Needs Assistance Bed Mobility: Supine to Sit Supine to sit: Supervision Sit to supine: Supervision, Min assist General bed mobility comments: No physical assistance required to transition to EOB. He did demonstrate heavy use of bed rails and required increased time. Transfers Overall transfer level: Needs assistance Equipment used: Rolling walker (2 wheeled), 2 person hand held assist Transfers: Sit to/from Stand, Stand Pivot Transfers Sit to Stand: Max assist, +2 physical assistance, From elevated surface Stand pivot transfers: Mod assist, +2 physical assistance, From elevated surface General transfer comment: Pt stood x2 with RW for support and +2 max assist for balance support. Significant anterior lean noted with cues required for upright posture. SPT was  completed with +2 mod assist and no walker. Pt was able to take pivotal steps around to the chair.  Ambulation/Gait Ambulation/Gait assistance: Mod assist Ambulation Distance (Feet): 30 Feet Assistive device: Rolling walker (2 wheeled) Gait Pattern/deviations: Decreased step length - right, Narrow base of support, Ataxic General Gait Details: Pt was unsafe to attempt at this time.     ADL: ADL Overall ADL's : Needs assistance/impaired Eating/Feeding: Set up, Supervision/ safety, Sitting Grooming: Wash/dry hands, Wash/dry face, Oral care, Supervision/safety, Set up, Bed level Grooming Details (indicate cue type and reason): difficulty with manipulating objects Upper Body Bathing: Minimal assistance Lower Body Bathing: Maximal assistance, Sit to/from stand Upper Body Dressing : Minimal  assistance, Cueing for sequencing Upper Body Dressing Details (indicate cue type and reason): VC's required for problem solving through task as well as for hemidressing techniques. Lower Body Dressing: Maximal assistance, Sit to/from stand Toilet Transfer: Moderate assistance, +2 for physical assistance, Stand-pivot Toilet Transfer Details (indicate cue type and reason): Attempted sit<>stand for ambulation x3 with max assist +2. Pt unsafe to attempt ambulation but was able to complete simulated stand-pivot toilet transfer with mod assist +2. Toileting- Clothing Manipulation and Hygiene: Moderate assistance Functional mobility during ADLs: +2 for physical assistance General ADL Comments: VC's for hemidressing technique.  Continues to require verbal, visual, and tactile cues for sequencing of tasks.  Cognition: Cognition Overall Cognitive Status: Impaired/Different from baseline Orientation Level: Oriented X4 Cognition Arousal/Alertness: Awake/alert Behavior During Therapy: WFL for tasks assessed/performed Overall Cognitive Status: Impaired/Different from baseline Area of Impairment: Following commands, Safety/judgement, Awareness, Problem solving Following Commands: Follows one step commands with increased time Safety/Judgement: Decreased awareness of safety Awareness: Emergent Problem Solving: Slow processing, Difficulty sequencing General Comments: Pt with good awareness of deficits and able to verbalize problems as they occur. Continues to require VC's for sequencing and increased time for processing.   Blood pressure 132/62, pulse 66, temperature 98.6 F (37 C), temperature source Oral, resp. rate 16, height 5\' 9"  (1.753 m), weight 111.1 kg (245 lb), SpO2 92 %. Physical Exam  Nursing note reviewed. Constitutional: He is oriented to person, place, and time. He appears well-developed and well-nourished.  HENT:  Head: Normocephalic and atraumatic.  Mouth/Throat: Oropharynx is clear  and moist.  Eyes: EOM are normal. Pupils are equal, round, and reactive to light. Right conjunctiva is injected. Left conjunctiva is injected.  Neck: Normal range of motion. Neck supple. No tracheal deviation present. No thyromegaly present.  Cardiovascular: Normal rate and regular rhythm.  Exam reveals no friction rub.   Respiratory: Effort normal and breath sounds normal. No stridor. He has no rales. He exhibits no tenderness.  GI: Soft. Bowel sounds are normal. He exhibits no distension. There is no tenderness.  Musculoskeletal: He exhibits edema. He exhibits no tenderness.  2+ pedal edema and 1+ pretibial edema. Multiple dry lesions right tibial and left lateral shin. Left 3 rd nail with surrounding erythema.   Neurological: He is alert and oriented to person, place, and time.  Sitting up in chair-slumped to the left. Speech with moderate dysarthria. Limited vision. Tends to keep eyes closed. Sensory deficits RLE> LLE.  Mild ataxia with finger to nose RUE. Strength 4/5 RUE prox to distal. LUE 5/5. + Pronator drift. RLE: 3+HF and 4-KE , 4/5 ADF/PF. LLE grossly 4+ /5. Poor insight and awareness.    Skin: Skin is warm and dry.  Psychiatric: He has a normal mood and affect. His behavior is normal.    Results for orders placed or  performed during the hospital encounter of 03/28/16 (from the past 48 hour(s))  Glucose, capillary     Status: Abnormal   Collection Time: 04/01/16 11:10 AM  Result Value Ref Range   Glucose-Capillary 306 (H) 65 - 99 mg/dL  Glucose, capillary     Status: Abnormal   Collection Time: 04/01/16  4:36 PM  Result Value Ref Range   Glucose-Capillary 279 (H) 65 - 99 mg/dL  Glucose, capillary     Status: Abnormal   Collection Time: 04/01/16  9:23 PM  Result Value Ref Range   Glucose-Capillary 224 (H) 65 - 99 mg/dL   Comment 1 Notify RN    Comment 2 Document in Chart   Basic metabolic panel     Status: Abnormal   Collection Time: 04/02/16  2:51 AM  Result Value Ref Range    Sodium 137 135 - 145 mmol/L   Potassium 3.7 3.5 - 5.1 mmol/L   Chloride 104 101 - 111 mmol/L   CO2 26 22 - 32 mmol/L   Glucose, Bld 195 (H) 65 - 99 mg/dL   BUN 18 6 - 20 mg/dL   Creatinine, Ser 1.36 (H) 0.61 - 1.24 mg/dL   Calcium 8.9 8.9 - 10.3 mg/dL   GFR calc non Af Amer 55 (L) >60 mL/min   GFR calc Af Amer >60 >60 mL/min    Comment: (NOTE) The eGFR has been calculated using the CKD EPI equation. This calculation has not been validated in all clinical situations. eGFR's persistently <60 mL/min signify possible Chronic Kidney Disease.    Anion gap 7 5 - 15  Glucose, capillary     Status: Abnormal   Collection Time: 04/02/16  6:37 AM  Result Value Ref Range   Glucose-Capillary 177 (H) 65 - 99 mg/dL  Glucose, capillary     Status: Abnormal   Collection Time: 04/02/16 11:21 AM  Result Value Ref Range   Glucose-Capillary 184 (H) 65 - 99 mg/dL   Comment 1 Notify RN    Comment 2 Document in Chart   Glucose, capillary     Status: Abnormal   Collection Time: 04/02/16  4:11 PM  Result Value Ref Range   Glucose-Capillary 216 (H) 65 - 99 mg/dL   Comment 1 Notify RN    Comment 2 Document in Chart   Glucose, capillary     Status: Abnormal   Collection Time: 04/02/16  9:58 PM  Result Value Ref Range   Glucose-Capillary 204 (H) 65 - 99 mg/dL   Comment 1 Notify RN    Comment 2 Document in Chart   Basic metabolic panel     Status: Abnormal   Collection Time: 04/03/16  3:15 AM  Result Value Ref Range   Sodium 141 135 - 145 mmol/L   Potassium 3.3 (L) 3.5 - 5.1 mmol/L   Chloride 103 101 - 111 mmol/L   CO2 26 22 - 32 mmol/L   Glucose, Bld 149 (H) 65 - 99 mg/dL   BUN 23 (H) 6 - 20 mg/dL   Creatinine, Ser 1.43 (H) 0.61 - 1.24 mg/dL   Calcium 9.2 8.9 - 10.3 mg/dL   GFR calc non Af Amer 52 (L) >60 mL/min   GFR calc Af Amer >60 >60 mL/min    Comment: (NOTE) The eGFR has been calculated using the CKD EPI equation. This calculation has not been validated in all clinical  situations. eGFR's persistently <60 mL/min signify possible Chronic Kidney Disease.    Anion gap 12 5 - 15  Glucose, capillary     Status: Abnormal   Collection Time: 04/03/16  6:49 AM  Result Value Ref Range   Glucose-Capillary 155 (H) 65 - 99 mg/dL   Comment 1 Notify RN    Comment 2 Document in Chart    No results found.     Medical Problem List and Plan: 1.  Right hemiparesis and functional deficits secondary to left paramedian pontine infarct  -admit to inpatient rehab 2. H/o DVT /Anticoagulation: Pharmaceutical: Xarelto 3. Pain Management: N/A.  4. Mood: LCSW to follow for evaluation and support.  5. Neuropsych: This patient is not yet capable of making decisions on his own behalf. 6. Skin/Wound Care: Routine pressure relief measures.  7. Fluids/Electrolytes/Nutrition: Monitor I/O. Check lytes in am 8. HTN: Monitor BP qid for now--avoid hypotension. Blood pressure slowly improving with titration of medications--continue chlorthalidone, coreg, cozaar, Bidil and lasix.     9. T2DM with retinopathy and neuropathy: Blood sugars poorly controlled with A1C- 11.0. Consult RD to educated on CM/HH diet. Monitor BS ac/hs. Continue Novolog 70/30 bid and titrate as needed for tighter control. 10. CAD/ Acute systolic CHF/NICM: compensated. Check daily weights and monitor for signs of overload. Heart healthy/low salt diet. Will need perfusion study on outpatient basis per cardiology.  On lasix, Bidil, cozaar, hygroton and coreg.  11. Obesity: BMI - 36. Encourage appropriate diet, weight loss and exercise.  12. CKD: Anticipate some rise due to diuresis from of lasix, ch and hydralazine. Will monitor with serial check of lytes.  13. Hypokalemia: was supplemented today. Will add low dose daily supplement.    Post Admission Physician Evaluation: 1. Functional deficits secondary  to left paramedian pontine infarct. 2. Patient is admitted to receive collaborative, interdisciplinary care between  the physiatrist, rehab nursing staff, and therapy team. 3. Patient's level of medical complexity and substantial therapy needs in context of that medical necessity cannot be provided at a lesser intensity of care such as a SNF. 4. Patient has experienced substantial functional loss from his/her baseline which was documented above under the "Functional History" and "Functional Status" headings.  Judging by the patient's diagnosis, physical exam, and functional history, the patient has potential for functional progress which will result in measurable gains while on inpatient rehab.  These gains will be of substantial and practical use upon discharge  in facilitating mobility and self-care at the household level. 5. Physiatrist will provide 24 hour management of medical needs as well as oversight of the therapy plan/treatment and provide guidance as appropriate regarding the interaction of the two. 6. The Preadmission Screening has been reviewed and patient status is unchanged unless otherwise stated above. 7. 24 hour rehab nursing will assist with bladder management, bowel management, safety, skin/wound care, disease management, medication administration, pain management and patient education  and help integrate therapy concepts, techniques,education, etc. 8. PT will assess and treat for/with: Lower extremity strength, range of motion, stamina, balance, functional mobility, safety, adaptive techniques and equipment, NMR, visual-perceptual awareness, cognitive intregration.   Goals are: supervision to mod I. 9. OT will assess and treat for/with: ADL's, functional mobility, safety, upper extremity strength, adaptive techniques and equipment, NMR, family education, visual-perceptual awareness.   Goals are: mod I to min assist. Therapy may proceed with showering this patient. 10. SLP will assess and treat for/with: cognition, communication, swallowing.  Goals are: mod I to supervision. 11. Case Management and  Social Worker will assess and treat for psychological issues and discharge planning. 12. Team conference will be held  weekly to assess progress toward goals and to determine barriers to discharge. 13. Patient will receive at least 3 hours of therapy per day at least 5 days per week. 14. ELOS: 15-20 days       15. Prognosis:  excellent     Meredith Staggers, MD, [facility] 04/03/2016  [place], [place], Hershal [place] 04/03/2016

## 2016-04-03 ENCOUNTER — Encounter (HOSPITAL_COMMUNITY): Payer: Self-pay

## 2016-04-03 ENCOUNTER — Inpatient Hospital Stay (HOSPITAL_COMMUNITY)
Admission: RE | Admit: 2016-04-03 | Discharge: 2016-04-25 | DRG: 056 | Disposition: A | Discharge status: Skilled Nursing Facility | Payer: Medicare Other | Source: Intra-hospital | Attending: Physical Medicine & Rehabilitation | Admitting: Physical Medicine & Rehabilitation

## 2016-04-03 DIAGNOSIS — R509 Fever, unspecified: Secondary | ICD-10-CM | POA: Diagnosis not present

## 2016-04-03 DIAGNOSIS — R252 Cramp and spasm: Secondary | ICD-10-CM | POA: Diagnosis not present

## 2016-04-03 DIAGNOSIS — I639 Cerebral infarction, unspecified: Secondary | ICD-10-CM

## 2016-04-03 DIAGNOSIS — D62 Acute posthemorrhagic anemia: Secondary | ICD-10-CM | POA: Diagnosis present

## 2016-04-03 DIAGNOSIS — I63432 Cerebral infarction due to embolism of left posterior cerebral artery: Secondary | ICD-10-CM

## 2016-04-03 DIAGNOSIS — Z86718 Personal history of other venous thrombosis and embolism: Secondary | ICD-10-CM

## 2016-04-03 DIAGNOSIS — Z8673 Personal history of transient ischemic attack (TIA), and cerebral infarction without residual deficits: Secondary | ICD-10-CM | POA: Diagnosis not present

## 2016-04-03 DIAGNOSIS — E11649 Type 2 diabetes mellitus with hypoglycemia without coma: Secondary | ICD-10-CM | POA: Diagnosis not present

## 2016-04-03 DIAGNOSIS — R4189 Other symptoms and signs involving cognitive functions and awareness: Secondary | ICD-10-CM | POA: Diagnosis present

## 2016-04-03 DIAGNOSIS — E785 Hyperlipidemia, unspecified: Secondary | ICD-10-CM | POA: Diagnosis not present

## 2016-04-03 DIAGNOSIS — I69351 Hemiplegia and hemiparesis following cerebral infarction affecting right dominant side: Secondary | ICD-10-CM | POA: Diagnosis not present

## 2016-04-03 DIAGNOSIS — I5043 Acute on chronic combined systolic (congestive) and diastolic (congestive) heart failure: Secondary | ICD-10-CM | POA: Diagnosis not present

## 2016-04-03 DIAGNOSIS — F4323 Adjustment disorder with mixed anxiety and depressed mood: Secondary | ICD-10-CM | POA: Diagnosis not present

## 2016-04-03 DIAGNOSIS — E1165 Type 2 diabetes mellitus with hyperglycemia: Secondary | ICD-10-CM | POA: Diagnosis present

## 2016-04-03 DIAGNOSIS — M25551 Pain in right hip: Secondary | ICD-10-CM | POA: Diagnosis not present

## 2016-04-03 DIAGNOSIS — I169 Hypertensive crisis, unspecified: Secondary | ICD-10-CM | POA: Diagnosis not present

## 2016-04-03 DIAGNOSIS — I2510 Atherosclerotic heart disease of native coronary artery without angina pectoris: Secondary | ICD-10-CM

## 2016-04-03 DIAGNOSIS — I130 Hypertensive heart and chronic kidney disease with heart failure and stage 1 through stage 4 chronic kidney disease, or unspecified chronic kidney disease: Secondary | ICD-10-CM | POA: Diagnosis present

## 2016-04-03 DIAGNOSIS — N182 Chronic kidney disease, stage 2 (mild): Secondary | ICD-10-CM

## 2016-04-03 DIAGNOSIS — E870 Hyperosmolality and hypernatremia: Secondary | ICD-10-CM | POA: Diagnosis not present

## 2016-04-03 DIAGNOSIS — R4689 Other symptoms and signs involving appearance and behavior: Secondary | ICD-10-CM | POA: Diagnosis not present

## 2016-04-03 DIAGNOSIS — I5021 Acute systolic (congestive) heart failure: Secondary | ICD-10-CM | POA: Diagnosis present

## 2016-04-03 DIAGNOSIS — E11319 Type 2 diabetes mellitus with unspecified diabetic retinopathy without macular edema: Secondary | ICD-10-CM | POA: Diagnosis present

## 2016-04-03 DIAGNOSIS — N19 Unspecified kidney failure: Secondary | ICD-10-CM

## 2016-04-03 DIAGNOSIS — I429 Cardiomyopathy, unspecified: Secondary | ICD-10-CM | POA: Diagnosis present

## 2016-04-03 DIAGNOSIS — E669 Obesity, unspecified: Secondary | ICD-10-CM | POA: Diagnosis present

## 2016-04-03 DIAGNOSIS — R339 Retention of urine, unspecified: Secondary | ICD-10-CM

## 2016-04-03 DIAGNOSIS — R5383 Other fatigue: Secondary | ICD-10-CM | POA: Diagnosis not present

## 2016-04-03 DIAGNOSIS — R1312 Dysphagia, oropharyngeal phase: Secondary | ICD-10-CM | POA: Diagnosis not present

## 2016-04-03 DIAGNOSIS — R531 Weakness: Secondary | ICD-10-CM | POA: Diagnosis not present

## 2016-04-03 DIAGNOSIS — Z09 Encounter for follow-up examination after completed treatment for conditions other than malignant neoplasm: Secondary | ICD-10-CM

## 2016-04-03 DIAGNOSIS — N183 Chronic kidney disease, stage 3 (moderate): Secondary | ICD-10-CM | POA: Diagnosis not present

## 2016-04-03 DIAGNOSIS — T502X5A Adverse effect of carbonic-anhydrase inhibitors, benzothiadiazides and other diuretics, initial encounter: Secondary | ICD-10-CM | POA: Diagnosis not present

## 2016-04-03 DIAGNOSIS — N390 Urinary tract infection, site not specified: Secondary | ICD-10-CM | POA: Diagnosis present

## 2016-04-03 DIAGNOSIS — E876 Hypokalemia: Secondary | ICD-10-CM | POA: Diagnosis present

## 2016-04-03 DIAGNOSIS — M25521 Pain in right elbow: Secondary | ICD-10-CM | POA: Diagnosis not present

## 2016-04-03 DIAGNOSIS — M25511 Pain in right shoulder: Secondary | ICD-10-CM | POA: Diagnosis not present

## 2016-04-03 DIAGNOSIS — I129 Hypertensive chronic kidney disease with stage 1 through stage 4 chronic kidney disease, or unspecified chronic kidney disease: Secondary | ICD-10-CM | POA: Diagnosis not present

## 2016-04-03 DIAGNOSIS — R1310 Dysphagia, unspecified: Secondary | ICD-10-CM | POA: Diagnosis present

## 2016-04-03 DIAGNOSIS — M6281 Muscle weakness (generalized): Secondary | ICD-10-CM | POA: Diagnosis not present

## 2016-04-03 DIAGNOSIS — E1140 Type 2 diabetes mellitus with diabetic neuropathy, unspecified: Secondary | ICD-10-CM | POA: Diagnosis present

## 2016-04-03 DIAGNOSIS — I160 Hypertensive urgency: Secondary | ICD-10-CM | POA: Diagnosis not present

## 2016-04-03 DIAGNOSIS — M7989 Other specified soft tissue disorders: Secondary | ICD-10-CM | POA: Diagnosis not present

## 2016-04-03 DIAGNOSIS — E1122 Type 2 diabetes mellitus with diabetic chronic kidney disease: Secondary | ICD-10-CM | POA: Diagnosis present

## 2016-04-03 DIAGNOSIS — R451 Restlessness and agitation: Secondary | ICD-10-CM | POA: Diagnosis not present

## 2016-04-03 DIAGNOSIS — R7309 Other abnormal glucose: Secondary | ICD-10-CM | POA: Diagnosis not present

## 2016-04-03 DIAGNOSIS — R471 Dysarthria and anarthria: Secondary | ICD-10-CM | POA: Diagnosis not present

## 2016-04-03 DIAGNOSIS — B961 Klebsiella pneumoniae [K. pneumoniae] as the cause of diseases classified elsewhere: Secondary | ICD-10-CM | POA: Diagnosis not present

## 2016-04-03 DIAGNOSIS — I63439 Cerebral infarction due to embolism of unspecified posterior cerebral artery: Secondary | ICD-10-CM | POA: Diagnosis present

## 2016-04-03 DIAGNOSIS — W07XXXA Fall from chair, initial encounter: Secondary | ICD-10-CM | POA: Diagnosis not present

## 2016-04-03 DIAGNOSIS — R488 Other symbolic dysfunctions: Secondary | ICD-10-CM | POA: Diagnosis not present

## 2016-04-03 DIAGNOSIS — S4991XA Unspecified injury of right shoulder and upper arm, initial encounter: Secondary | ICD-10-CM | POA: Diagnosis not present

## 2016-04-03 DIAGNOSIS — IMO0002 Reserved for concepts with insufficient information to code with codable children: Secondary | ICD-10-CM | POA: Diagnosis present

## 2016-04-03 DIAGNOSIS — I10 Essential (primary) hypertension: Secondary | ICD-10-CM | POA: Diagnosis not present

## 2016-04-03 DIAGNOSIS — N179 Acute kidney failure, unspecified: Secondary | ICD-10-CM | POA: Diagnosis not present

## 2016-04-03 DIAGNOSIS — I5032 Chronic diastolic (congestive) heart failure: Secondary | ICD-10-CM

## 2016-04-03 DIAGNOSIS — S0990XA Unspecified injury of head, initial encounter: Secondary | ICD-10-CM | POA: Diagnosis not present

## 2016-04-03 DIAGNOSIS — I69393 Ataxia following cerebral infarction: Secondary | ICD-10-CM

## 2016-04-03 DIAGNOSIS — D649 Anemia, unspecified: Secondary | ICD-10-CM

## 2016-04-03 DIAGNOSIS — G629 Polyneuropathy, unspecified: Secondary | ICD-10-CM | POA: Diagnosis not present

## 2016-04-03 DIAGNOSIS — W07XXXD Fall from chair, subsequent encounter: Secondary | ICD-10-CM | POA: Diagnosis not present

## 2016-04-03 DIAGNOSIS — I6789 Other cerebrovascular disease: Secondary | ICD-10-CM | POA: Diagnosis not present

## 2016-04-03 DIAGNOSIS — M545 Low back pain: Secondary | ICD-10-CM | POA: Diagnosis not present

## 2016-04-03 DIAGNOSIS — Z6838 Body mass index (BMI) 38.0-38.9, adult: Secondary | ICD-10-CM

## 2016-04-03 DIAGNOSIS — E1159 Type 2 diabetes mellitus with other circulatory complications: Secondary | ICD-10-CM | POA: Diagnosis not present

## 2016-04-03 DIAGNOSIS — I150 Renovascular hypertension: Secondary | ICD-10-CM | POA: Diagnosis not present

## 2016-04-03 LAB — BASIC METABOLIC PANEL
Anion gap: 12 (ref 5–15)
BUN: 23 mg/dL — ABNORMAL HIGH (ref 6–20)
CALCIUM: 9.2 mg/dL (ref 8.9–10.3)
CO2: 26 mmol/L (ref 22–32)
CREATININE: 1.43 mg/dL — AB (ref 0.61–1.24)
Chloride: 103 mmol/L (ref 101–111)
GFR calc non Af Amer: 52 mL/min — ABNORMAL LOW (ref >60)
GLUCOSE: 149 mg/dL — AB (ref 65–99)
Potassium: 3.3 mmol/L — ABNORMAL LOW (ref 3.5–5.1)
Sodium: 141 mmol/L (ref 135–145)

## 2016-04-03 LAB — VAS US CAROTID
LEFT ECA DIAS: -1 cm/s
LEFT VERTEBRAL DIAS: 26 cm/s
LICAPDIAS: -25 cm/s
LICAPSYS: -86 cm/s
Left CCA dist dias: 19 cm/s
Left CCA dist sys: 86 cm/s
Left CCA prox dias: -15 cm/s
Left CCA prox sys: -87 cm/s
Left ICA dist dias: -28 cm/s
Left ICA dist sys: -66 cm/s
RCCAPSYS: 66 cm/s
RIGHT ECA DIAS: -18 cm/s
Right CCA prox dias: 12 cm/s

## 2016-04-03 LAB — GLUCOSE, CAPILLARY
Glucose-Capillary: 155 mg/dL — ABNORMAL HIGH (ref 65–99)
Glucose-Capillary: 128 mg/dL — ABNORMAL HIGH (ref 65–99)
Glucose-Capillary: 200 mg/dL — ABNORMAL HIGH (ref 65–99)
Glucose-Capillary: 139 mg/dL — ABNORMAL HIGH (ref 65–99)

## 2016-04-03 MED ORDER — INSULIN ASPART PROT & ASPART (70-30 MIX) 100 UNIT/ML ~~LOC~~ SUSP: 60.0000 [IU] | Freq: Every day | SUBCUTANEOUS | 11 refills | Status: DC

## 2016-04-03 MED ORDER — FLEET ENEMA 7-19 GM/118ML RE ENEM: 1.0000 | ENEMA | Freq: Once | RECTAL | Status: DC | PRN

## 2016-04-03 MED ORDER — INFLUENZA VAC SPLIT QUAD 0.5 ML IM SUSY
0.5000 mL | PREFILLED_SYRINGE | INTRAMUSCULAR | Status: DC
  Filled 2016-04-03: qty 0.5

## 2016-04-03 MED ORDER — DIPHENHYDRAMINE HCL 12.5 MG/5ML PO ELIX: 12.5000 mg | ORAL_SOLUTION | Freq: Four times a day (QID) | ORAL | Status: DC | PRN

## 2016-04-03 MED ORDER — CARVEDILOL 25 MG PO TABS
25.0000 mg | ORAL_TABLET | Freq: Two times a day (BID) | ORAL | Status: DC
Start: 2016-04-03 — End: 2016-04-25
  Administered 2016-04-03 – 2016-04-25 (×43): 25 mg via ORAL
  Filled 2016-04-03 (×44): qty 1

## 2016-04-03 MED ORDER — TRAZODONE HCL 50 MG PO TABS: 25.0000 mg | ORAL_TABLET | Freq: Every evening | ORAL | Status: DC | PRN

## 2016-04-03 MED ORDER — PROCHLORPERAZINE 25 MG RE SUPP: 12.5000 mg | Freq: Four times a day (QID) | RECTAL | Status: DC | PRN

## 2016-04-03 MED ORDER — INSULIN ASPART PROT & ASPART (70-30 MIX) 100 UNIT/ML ~~LOC~~ SUSP
35.0000 [IU] | Freq: Every day | SUBCUTANEOUS | Status: DC
Start: 2016-04-03 — End: 2016-04-04
  Administered 2016-04-03: 35 [IU] via SUBCUTANEOUS

## 2016-04-03 MED ORDER — CARVEDILOL 25 MG PO TABS: 25.0000 mg | ORAL_TABLET | Freq: Two times a day (BID) | ORAL | 0 refills | Status: DC

## 2016-04-03 MED ORDER — ISOSORB DINITRATE-HYDRALAZINE 20-37.5 MG PO TABS: 2.0000 | ORAL_TABLET | Freq: Three times a day (TID) | ORAL | 0 refills | Status: DC

## 2016-04-03 MED ORDER — ATORVASTATIN CALCIUM 40 MG PO TABS
40.0000 mg | ORAL_TABLET | Freq: Every day | ORAL | Status: DC
  Administered 2016-04-03 – 2016-04-24 (×22): 40 mg via ORAL
  Filled 2016-04-03 (×22): qty 1

## 2016-04-03 MED ORDER — INSULIN ASPART 100 UNIT/ML ~~LOC~~ SOLN
0.0000 [IU] | Freq: Three times a day (TID) | SUBCUTANEOUS | Status: DC
  Administered 2016-04-03 – 2016-04-04 (×2): 2 [IU] via SUBCUTANEOUS
  Administered 2016-04-05: 1 [IU] via SUBCUTANEOUS
  Administered 2016-04-06: 2 [IU] via SUBCUTANEOUS
  Administered 2016-04-06: 1 [IU] via SUBCUTANEOUS
  Administered 2016-04-07: 5 [IU] via SUBCUTANEOUS
  Administered 2016-04-07: 3 [IU] via SUBCUTANEOUS
  Administered 2016-04-08: 1 [IU] via SUBCUTANEOUS
  Administered 2016-04-08: 2 [IU] via SUBCUTANEOUS
  Administered 2016-04-09: 7 [IU] via SUBCUTANEOUS
  Administered 2016-04-09: 2 [IU] via SUBCUTANEOUS
  Administered 2016-04-09: 3 [IU] via SUBCUTANEOUS
  Administered 2016-04-10 (×3): 1 [IU] via SUBCUTANEOUS
  Administered 2016-04-11: 3 [IU] via SUBCUTANEOUS
  Administered 2016-04-11: 2 [IU] via SUBCUTANEOUS
  Administered 2016-04-12: 5 [IU] via SUBCUTANEOUS
  Administered 2016-04-12 (×2): 2 [IU] via SUBCUTANEOUS
  Administered 2016-04-13: 3 [IU] via SUBCUTANEOUS
  Administered 2016-04-13 (×2): 1 [IU] via SUBCUTANEOUS
  Administered 2016-04-14: 2 [IU] via SUBCUTANEOUS
  Administered 2016-04-14: 5 [IU] via SUBCUTANEOUS
  Administered 2016-04-14 – 2016-04-15 (×2): 2 [IU] via SUBCUTANEOUS
  Administered 2016-04-15: 5 [IU] via SUBCUTANEOUS
  Administered 2016-04-16: 3 [IU] via SUBCUTANEOUS
  Administered 2016-04-16: 1 [IU] via SUBCUTANEOUS
  Administered 2016-04-16: 2 [IU] via SUBCUTANEOUS
  Administered 2016-04-17: 5 [IU] via SUBCUTANEOUS
  Administered 2016-04-17: 7 [IU] via SUBCUTANEOUS
  Administered 2016-04-17: 3 [IU] via SUBCUTANEOUS
  Administered 2016-04-18: 2 [IU] via SUBCUTANEOUS
  Administered 2016-04-18: 5 [IU] via SUBCUTANEOUS
  Administered 2016-04-18: 7 [IU] via SUBCUTANEOUS
  Administered 2016-04-19 (×3): 2 [IU] via SUBCUTANEOUS
  Administered 2016-04-20: 3 [IU] via SUBCUTANEOUS
  Administered 2016-04-20: 7 [IU] via SUBCUTANEOUS
  Administered 2016-04-20: 2 [IU] via SUBCUTANEOUS
  Administered 2016-04-21: 1 [IU] via SUBCUTANEOUS
  Administered 2016-04-21 (×2): 2 [IU] via SUBCUTANEOUS
  Administered 2016-04-22: 5 [IU] via SUBCUTANEOUS
  Administered 2016-04-22: 2 [IU] via SUBCUTANEOUS
  Administered 2016-04-22 – 2016-04-23 (×2): 3 [IU] via SUBCUTANEOUS
  Administered 2016-04-23 (×2): 5 [IU] via SUBCUTANEOUS
  Administered 2016-04-24: 1 [IU] via SUBCUTANEOUS
  Administered 2016-04-24 – 2016-04-25 (×2): 2 [IU] via SUBCUTANEOUS
  Administered 2016-04-25: 3 [IU] via SUBCUTANEOUS

## 2016-04-03 MED ORDER — SENNOSIDES-DOCUSATE SODIUM 8.6-50 MG PO TABS
1.0000 | ORAL_TABLET | Freq: Every day | ORAL | Status: DC
  Administered 2016-04-04 – 2016-04-25 (×19): 1 via ORAL
  Filled 2016-04-03 (×22): qty 1

## 2016-04-03 MED ORDER — PRO-STAT SUGAR FREE PO LIQD
30.0000 mL | Freq: Two times a day (BID) | ORAL | Status: DC
  Administered 2016-04-03 – 2016-04-25 (×44): 30 mL via ORAL
  Filled 2016-04-03 (×44): qty 30

## 2016-04-03 MED ORDER — POTASSIUM CHLORIDE CRYS ER 20 MEQ PO TBCR
20.0000 meq | EXTENDED_RELEASE_TABLET | Freq: Every day | ORAL | Status: AC
  Administered 2016-04-04: 20 meq via ORAL
  Filled 2016-04-03: qty 1

## 2016-04-03 MED ORDER — ALUM & MAG HYDROXIDE-SIMETH 200-200-20 MG/5ML PO SUSP: 30.0000 mL | ORAL | Status: DC | PRN

## 2016-04-03 MED ORDER — CHLORTHALIDONE 25 MG PO TABS: 25.0000 mg | ORAL_TABLET | Freq: Every day | ORAL | 0 refills | Status: DC

## 2016-04-03 MED ORDER — CLONIDINE HCL 0.3 MG PO TABS: 0.3000 mg | ORAL_TABLET | Freq: Three times a day (TID) | ORAL | 11 refills | Status: DC

## 2016-04-03 MED ORDER — RIVAROXABAN 20 MG PO TABS
20.0000 mg | ORAL_TABLET | Freq: Every day | ORAL | Status: DC
  Administered 2016-04-03 – 2016-04-24 (×22): 20 mg via ORAL
  Filled 2016-04-03 (×22): qty 1

## 2016-04-03 MED ORDER — INSULIN ASPART PROT & ASPART (70-30 MIX) 100 UNIT/ML ~~LOC~~ SUSP: 35.0000 [IU] | Freq: Every day | SUBCUTANEOUS | 11 refills | Status: DC

## 2016-04-03 MED ORDER — FUROSEMIDE 40 MG PO TABS
80.0000 mg | ORAL_TABLET | Freq: Every day | ORAL | Status: DC
  Administered 2016-04-04 – 2016-04-25 (×22): 80 mg via ORAL
  Filled 2016-04-03 (×22): qty 2

## 2016-04-03 MED ORDER — ISOSORB DINITRATE-HYDRALAZINE 20-37.5 MG PO TABS
2.0000 | ORAL_TABLET | Freq: Three times a day (TID) | ORAL | Status: DC
  Administered 2016-04-03 – 2016-04-25 (×61): 2 via ORAL
  Filled 2016-04-03 (×71): qty 2

## 2016-04-03 MED ORDER — POTASSIUM CHLORIDE CRYS ER 20 MEQ PO TBCR
40.0000 meq | EXTENDED_RELEASE_TABLET | Freq: Two times a day (BID) | ORAL | Status: DC
  Administered 2016-04-03: 40 meq via ORAL
  Filled 2016-04-03: qty 2

## 2016-04-03 MED ORDER — PROCHLORPERAZINE MALEATE 5 MG PO TABS: 5.0000 mg | ORAL_TABLET | Freq: Four times a day (QID) | ORAL | Status: DC | PRN

## 2016-04-03 MED ORDER — CHLORTHALIDONE 25 MG PO TABS
25.0000 mg | ORAL_TABLET | Freq: Every day | ORAL | Status: DC
  Administered 2016-04-04 – 2016-04-05 (×2): 25 mg via ORAL
  Filled 2016-04-03 (×2): qty 1

## 2016-04-03 MED ORDER — ATORVASTATIN CALCIUM 40 MG PO TABS: 40.0000 mg | ORAL_TABLET | Freq: Every day | ORAL | 0 refills | Status: DC

## 2016-04-03 MED ORDER — PNEUMOCOCCAL VAC POLYVALENT 25 MCG/0.5ML IJ INJ
0.5000 mL | INJECTION | INTRAMUSCULAR | Status: DC
  Filled 2016-04-03: qty 0.5

## 2016-04-03 MED ORDER — LOSARTAN POTASSIUM 100 MG PO TABS: 100.0000 mg | ORAL_TABLET | Freq: Every day | ORAL | 0 refills | Status: DC

## 2016-04-03 MED ORDER — GUAIFENESIN-DM 100-10 MG/5ML PO SYRP: 5.0000 mL | ORAL_SOLUTION | Freq: Four times a day (QID) | ORAL | Status: DC | PRN

## 2016-04-03 MED ORDER — ACETAMINOPHEN 325 MG PO TABS
325.0000 mg | ORAL_TABLET | ORAL | Status: DC | PRN
  Administered 2016-04-04 – 2016-04-13 (×12): 650 mg via ORAL
  Filled 2016-04-03 (×12): qty 2

## 2016-04-03 MED ORDER — SENNOSIDES-DOCUSATE SODIUM 8.6-50 MG PO TABS: 1.0000 | ORAL_TABLET | Freq: Every day | ORAL | 0 refills | Status: DC

## 2016-04-03 MED ORDER — PROCHLORPERAZINE EDISYLATE 5 MG/ML IJ SOLN: 5.0000 mg | Freq: Four times a day (QID) | INTRAMUSCULAR | Status: DC | PRN

## 2016-04-03 MED ORDER — BISACODYL 10 MG RE SUPP
10.0000 mg | Freq: Every day | RECTAL | Status: DC | PRN
  Administered 2016-04-07 – 2016-04-18 (×3): 10 mg via RECTAL
  Filled 2016-04-03 (×3): qty 1

## 2016-04-03 MED ORDER — LOSARTAN POTASSIUM 50 MG PO TABS
100.0000 mg | ORAL_TABLET | Freq: Every day | ORAL | Status: DC
  Administered 2016-04-04 – 2016-04-25 (×22): 100 mg via ORAL
  Filled 2016-04-03 (×22): qty 2

## 2016-04-03 MED ORDER — INSULIN ASPART PROT & ASPART (70-30 MIX) 100 UNIT/ML ~~LOC~~ SUSP
60.0000 [IU] | Freq: Every day | SUBCUTANEOUS | Status: DC
  Filled 2016-04-03: qty 10

## 2016-04-03 MED ORDER — POLYETHYLENE GLYCOL 3350 17 G PO PACK
17.0000 g | PACK | Freq: Every day | ORAL | Status: DC | PRN
  Administered 2016-04-05 – 2016-04-20 (×5): 17 g via ORAL
  Filled 2016-04-03 (×7): qty 1

## 2016-04-03 MED ORDER — CLONIDINE HCL 0.3 MG PO TABS
0.3000 mg | ORAL_TABLET | Freq: Three times a day (TID) | ORAL | Status: DC
  Administered 2016-04-03 – 2016-04-25 (×59): 0.3 mg via ORAL
  Filled 2016-04-03 (×62): qty 1

## 2016-04-03 NOTE — Progress Notes (Signed)
Pt being discharged to inpatient rehab ([facility]) per orders from MD. Pt and family made aware and educated on transfer. All questions and concerns were addressed. RN gave report to RN, ED. Pt was transferred via bed.

## 2016-04-03 NOTE — Discharge Summary (Addendum)
Physician Discharge Summary  [name] [id number] DOB: 03/04/1955 DOA: 03/28/2016  PCP: [name], MD  Admit date: 03/28/2016 Discharge date: 04/11/2016  Admitted From: Home Disposition:  CIR  Recommendations for Outpatient Follow-up:  1. Follow up with PCP in 2-3 weeks 2. Follow up with Cardiology as scheduled 3. Follow up with Neurology as scheduled when discharged  Discharge Condition:Stable CODE STATUS:Full Diet recommendation: Diabetic, heart healthy, thin liquids  Brief/Interim Summary: 62 y.o.malewith history of diabetes mellitus type 2, hypertension, CHF, DVT presents to ER because of increasing numbness of the right upper and lower extremity. Patient's symptoms started around 7 PM last evening. Denies any associated weakness of extremities or any difficulty swallowing or speaking or visual symptoms. CT of the head was unremarkable. On-call neurologist Dr. Jacqulyn Liner consulted and requested patient be transferred to [facility]. MRI of the brain and MRA of the head and neck was showing no acute infarct but did show occlusion of the right vertebral artery. On my exam patient still complains of right upper and lower extremity numbness but no obvious weakness. Patient's blood pressure is also markedly elevated on presentation which improved without any intervention.   1. Acute L Paramedian Pons CVA 1. Case was discussed with Dr. Cheral Marker prior to transfer to [facility] 2. Repeat MRI confirming acute 35mm L paramedian pons infarct 3. Patient is continued on xarelto for secondary stroke prevention 4. Remains stable at present 5. For CIR 2. Hypertensive urgency 1. Patient is continued on clonidine, hydralazine, cozaar 2. Imdur changed to bidil per Cardiology 3. BP currently stable, albeit suboptimal 4. clonidine was increased to 0.3mg  tid with improved blood pressure 3. History of DVT 1. Reportedly missed his xarelto dose last 3 days prior to admission.   2. Xarelto is continued 3. Remains stable currently 4. Diabetes mellitus type 2with hyperglycemia 1. patient is on NovoLog 70/30 twice a day.  2. Glucose in the mid-100's.  3. Continue insulin as tolerated for now 4. Continue to titrate insulin as needed 5. Would recommend SSI coverage as needed  5. Compensated systolic CHF, chronicity unknown 1. last EF measured in 2014 was 60-65% with grade 1 diastolic dysfunction - appears compensated. On Lasix. 2. Repeat 2d echo results reviewed with EF of 25-30%. 3. Cardiology following, with recs for perfusion study as outpatient 4. Remains stable 5. Cardiology to contact patient/family regarding scheduling outpatient follow up 6. Stage 3 CKD  Discharge Diagnoses:  Active Problems:   Diabetes type 2, uncontrolled ([facility])   Cardiomyopathy ([place])   DVT (deep venous thrombosis) ([facility])   Left sided numbness   Hypertensive urgency   Right arm numbness   Cerebral infarction due to thrombosis of other cerebral artery ([facility])   Brainstem stroke ([facility])   Abnormality of gait and mobility    Discharge Instructions  Discharge Instructions    Ambulatory referral to Neurology    Complete by:  As directed    Stroke F/U appt with Nurse Practitioner or Dr Leonie Man in 6 - 8 weeks.     Allergies as of 04/03/2016      Reactions   Iohexol Hives   Ivp Dye [iodinated Diagnostic Agents] Hives      Medication List    STOP taking these medications   cephALEXin 500 MG capsule Commonly known as:  KEFLEX   hydrALAZINE 25 MG tablet Commonly known as:  APRESOLINE   hydrOXYzine 25 MG tablet Commonly known as:  ATARAX/VISTARIL   insulin NPH-regular Human (70-30) 100 UNIT/ML injection Commonly known  as:  NOVOLIN 70/30   losartan-hydrochlorothiazide 100-12.5 MG tablet Commonly known as:  HYZAAR   metFORMIN 1000 MG tablet Commonly known as:  GLUCOPHAGE   metoprolol tartrate 25 MG tablet Commonly known as:  LOPRESSOR   ondansetron 4 MG disintegrating  tablet Commonly known as:  ZOFRAN ODT     TAKE these medications   atorvastatin 40 MG tablet Commonly known as:  LIPITOR Take 1 tablet (40 mg total) by mouth daily at 6 PM.   carvedilol 25 MG tablet Commonly known as:  COREG Take 1 tablet (25 mg total) by mouth 2 (two) times daily with a meal.   chlorthalidone 25 MG tablet Commonly known as:  HYGROTON Take 1 tablet (25 mg total) by mouth daily.   cloNIDine 0.3 MG tablet Commonly known as:  CATAPRES Take 1 tablet (0.3 mg total) by mouth 3 (three) times daily. What changed:  medication strength  how much to take   furosemide 80 MG tablet Commonly known as:  LASIX Take 80 mg by mouth daily.   insulin aspart protamine- aspart (70-30) 100 UNIT/ML injection Commonly known as:  NOVOLOG MIX 70/30 Inject 0.35 mLs (35 Units total) into the skin daily with supper.   insulin aspart protamine- aspart (70-30) 100 UNIT/ML injection Commonly known as:  NOVOLOG MIX 70/30 Inject 0.6 mLs (60 Units total) into the skin daily with breakfast.   isosorbide-hydrALAZINE 20-37.5 MG tablet Commonly known as:  BIDIL Take 2 tablets by mouth 3 (three) times daily.   losartan 100 MG tablet Commonly known as:  COZAAR Take 1 tablet (100 mg total) by mouth daily.   rivaroxaban 20 MG Tabs tablet Commonly known as:  XARELTO Take 20 mg by mouth daily.   senna-docusate 8.6-50 MG tablet Commonly known as:  Senokot-S Take 1 tablet by mouth daily.   triamcinolone ointment 0.1 % Commonly known as:  KENALOG Apply 1 application topically 2 (two) times daily as needed (rash/irritation on legs).      Follow-up Information    [facility]. Schedule an appointment as soon as possible for a visit in 2 month(s).   Specialty:  Neurology Why:  Office will call you. Contact information: [address] [place] [phone]       [name], MD. Schedule an appointment as soon as  possible for a visit in 2 week(s).   Specialty:  Family Medicine Contact information: [place] [address] [phone]        Follow up with Cardiology. You will be called to arrange follow up Follow up.          Allergies  Allergen Reactions  . Iohexol Hives  . Ivp Dye [Iodinated Diagnostic Agents] Hives    Consultations:  Cardiology  Neurology  CIR  Procedures/Studies: Mr Virgel Paling [id number] Contrast  Result Date: 03/29/2016 CLINICAL DATA:  RIGHT hand numbness beginning at 7 p.m., progressive RIGHT leg numbness. History of hypertension, diabetes. EXAM: MRI HEAD WITHOUT CONTRAST MRA HEAD WITHOUT CONTRAST MRA NECK WITHOUT AND WITH CONTRAST TECHNIQUE: Multiplanar, multiecho pulse sequences of the brain and surrounding structures were obtained without intravenous contrast. Angiographic images of the Circle of Willis were obtained using MRA technique without intravenous contrast. Angiographic images of the neck were obtained using MRA technique without and with intravenous contrast. Carotid stenosis measurements (when applicable) are obtained utilizing NASCET criteria, using the distal internal carotid diameter as the denominator. CONTRAST:  67mL MULTIHANCE GADOBENATE DIMEGLUMINE 529 MG/ML IV SOLN  COMPARISON:  CT HEAD March 28, 2016 at 2117 hours FINDINGS: MRI HEAD FINDINGS- many sequences are moderately motion degraded. BRAIN: No reduced diffusion to suggest acute ischemia. No susceptibility artifact to suggest hemorrhage. Moderate ventriculomegaly on the basis of global parenchymal brain volume loss. Confluent supratentorial white matter FLAIR T2 hyperintensities. Multiple old pontine, cerebellar infarcts. Old bilateral thalamus and bilateral basal ganglia lacunar infarcts. No midline shift, mass effect or masses. No abnormal extra-axial fluid collections. VASCULAR: Normal major intracranial vascular flow voids present at skull base. SKULL AND UPPER CERVICAL  SPINE: No abnormal sellar expansion. No suspicious calvarial bone marrow signal. Craniocervical junction maintained. SINUSES/ORBITS: The mastoid air-cells and included paranasal sinuses are well-aerated. Status post bilateral ocular lens implants. The included ocular globes and orbital contents are non-suspicious. OTHER: None. MRA HEAD FINDINGS- moderately motion degraded examination. ANTERIOR CIRCULATION: Flow related enhancement within bilateral internal carotid arteries. However, is at least moderate stenosis bilateral petrous segments. Severe stenosis RIGHT greater than LEFT carotid siphons. Bilateral anterior middle cerebral arteries are patent, limited assessment due to motion. Supernumerary anterior cerebral artery. POSTERIOR CIRCULATION: No demonstrated RIGHT vertebral artery. LEFT vertebral artery is widely patent. Flow related enhancement bilateral posterior inferior cerebellar arteries. Moderate stenosis proximal basilar artery. Robust LEFT posterior communicating artery present. Moderate tandem stenoses RIGHT posterior cerebral artery. Poor, thready flow related enhancement LEFT posterior-inferior cerebellar artery with LEFT P1 segment severe stenosis. ANATOMIC VARIANTS: None. MRA NECK FINDINGS- mild motion degraded examination. ANTERIOR CIRCULATION: Mild stenosis LEFT Common carotid artery origin. Bilateral internal carotid arteries are widely patent. Mild stenosis LEFT mid cervical internal carotid artery compatible with atherosclerosis. POSTERIOR CIRCULATION: RIGHT vertebral artery is occluded from the origin without significant reconstitution. Moderate luminal irregularity LEFT vertebral artery including the origin. Source images and MIP image were reviewed. IMPRESSION: MRI HEAD: No acute intracranial process on this motion degraded examination. 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 Moderate to severe chronic small vessel ischemic disease. Moderate global parenchymal brain  volume loss, advanced for age. MRA HEAD: Motion degraded examination. RIGHT vertebral artery occlusion, likely old. Severe stenoses bilateral internal carotid arteries due to atherosclerosis. Multifocal moderate stenoses posterior circulation compatible with atherosclerosis. MRA NECK: Occluded RIGHT vertebral artery without significant reconstitution. Moderate atherosclerosis LEFT vertebral artery. No hemodynamically significant stenosis of the carotid arteries. Electronically Signed   By: Elon Alas M.D.   On: 03/29/2016 00:04   Mr Angiogram Neck W Or Wo Contrast  Result Date: 03/29/2016 CLINICAL DATA:  RIGHT hand numbness beginning at 7 p.m., progressive RIGHT leg numbness. History of hypertension, diabetes. EXAM: MRI HEAD WITHOUT CONTRAST MRA HEAD WITHOUT CONTRAST MRA NECK WITHOUT AND WITH CONTRAST TECHNIQUE: Multiplanar, multiecho pulse sequences of the brain and surrounding structures were obtained without intravenous contrast. Angiographic images of the Circle of Willis were obtained using MRA technique without intravenous contrast. Angiographic images of the neck were obtained using MRA technique without and with intravenous contrast. Carotid stenosis measurements (when applicable) are obtained utilizing NASCET criteria, using the distal internal carotid diameter as the denominator. CONTRAST:  61mL MULTIHANCE GADOBENATE DIMEGLUMINE 529 MG/ML IV SOLN COMPARISON:  CT HEAD March 28, 2016 at 2117 hours FINDINGS: MRI HEAD FINDINGS- many sequences are moderately motion degraded. BRAIN: No reduced diffusion to suggest acute ischemia. No susceptibility artifact to suggest hemorrhage. Moderate ventriculomegaly on the basis of global parenchymal brain volume loss. Confluent supratentorial white matter FLAIR T2 hyperintensities. Multiple old pontine, cerebellar infarcts. Old bilateral thalamus and bilateral basal ganglia lacunar infarcts. No midline shift, mass effect or  masses. No abnormal extra-axial fluid  collections. VASCULAR: Normal major intracranial vascular flow voids present at skull base. SKULL AND UPPER CERVICAL SPINE: No abnormal sellar expansion. No suspicious calvarial bone marrow signal. Craniocervical junction maintained. SINUSES/ORBITS: The mastoid air-cells and included paranasal sinuses are well-aerated. Status post bilateral ocular lens implants. The included ocular globes and orbital contents are non-suspicious. OTHER: None. MRA HEAD FINDINGS- moderately motion degraded examination. ANTERIOR CIRCULATION: Flow related enhancement within bilateral internal carotid arteries. However, is at least moderate stenosis bilateral petrous segments. Severe stenosis RIGHT greater than LEFT carotid siphons. Bilateral anterior middle cerebral arteries are patent, limited assessment due to motion. Supernumerary anterior cerebral artery. POSTERIOR CIRCULATION: No demonstrated RIGHT vertebral artery. LEFT vertebral artery is widely patent. Flow related enhancement bilateral posterior inferior cerebellar arteries. Moderate stenosis proximal basilar artery. Robust LEFT posterior communicating artery present. Moderate tandem stenoses RIGHT posterior cerebral artery. Poor, thready flow related enhancement LEFT posterior-inferior cerebellar artery with LEFT P1 segment severe stenosis. ANATOMIC VARIANTS: None. MRA NECK FINDINGS- mild motion degraded examination. ANTERIOR CIRCULATION: Mild stenosis LEFT Common carotid artery origin. Bilateral internal carotid arteries are widely patent. Mild stenosis LEFT mid cervical internal carotid artery compatible with atherosclerosis. POSTERIOR CIRCULATION: RIGHT vertebral artery is occluded from the origin without significant reconstitution. Moderate luminal irregularity LEFT vertebral artery including the origin. Source images and MIP image were reviewed. IMPRESSION: MRI HEAD: No acute intracranial process on this motion degraded examination. Multiple old supra- and infratentorial  small vessel infarcts. Moderate to severe chronic small vessel ischemic disease. Moderate global parenchymal brain volume loss, advanced for age. MRA HEAD: Motion degraded examination. RIGHT vertebral artery occlusion, likely old. Severe stenoses bilateral internal carotid arteries due to atherosclerosis. Multifocal moderate stenoses posterior circulation compatible with atherosclerosis. MRA NECK: Occluded RIGHT vertebral artery without significant reconstitution. Moderate atherosclerosis LEFT vertebral artery. No hemodynamically significant stenosis of the carotid arteries. Electronically Signed   By: Elon Alas M.D.   On: 03/29/2016 00:04   Mr Brain Wo Contrast  Result Date: 03/29/2016 CLINICAL DATA:  RIGHT hand numbness beginning at 7 p.m., progressive RIGHT leg numbness. History of hypertension, diabetes. EXAM: MRI HEAD WITHOUT CONTRAST MRA HEAD WITHOUT CONTRAST MRA NECK WITHOUT AND WITH CONTRAST TECHNIQUE: Multiplanar, multiecho pulse sequences of the brain and surrounding structures were obtained without intravenous contrast. Angiographic images of the Circle of Willis were obtained using MRA technique without intravenous contrast. Angiographic images of the neck were obtained using MRA technique without and with intravenous contrast. Carotid stenosis measurements (when applicable) are obtained utilizing NASCET criteria, using the distal internal carotid diameter as the denominator. CONTRAST:  46mL MULTIHANCE GADOBENATE DIMEGLUMINE 529 MG/ML IV SOLN COMPARISON:  CT HEAD March 28, 2016 at 2117 hours FINDINGS: MRI HEAD FINDINGS- many sequences are moderately motion degraded. BRAIN: No reduced diffusion to suggest acute ischemia. No susceptibility artifact to suggest hemorrhage. Moderate ventriculomegaly on the basis of global parenchymal brain volume loss. Confluent supratentorial white matter FLAIR T2 hyperintensities. Multiple old pontine, cerebellar infarcts. Old bilateral thalamus and bilateral  basal ganglia lacunar infarcts. No midline shift, mass effect or masses. No abnormal extra-axial fluid collections. VASCULAR: Normal major intracranial vascular flow voids present at skull base. SKULL AND UPPER CERVICAL SPINE: No abnormal sellar expansion. No suspicious calvarial bone marrow signal. Craniocervical junction maintained. SINUSES/ORBITS: The mastoid air-cells and included paranasal sinuses are well-aerated. Status post bilateral ocular lens implants. The included ocular globes and orbital contents are non-suspicious. OTHER: None. MRA HEAD FINDINGS- moderately motion degraded examination. ANTERIOR CIRCULATION: Flow related enhancement  within bilateral internal carotid arteries. However, is at least moderate stenosis bilateral petrous segments. Severe stenosis RIGHT greater than LEFT carotid siphons. Bilateral anterior middle cerebral arteries are patent, limited assessment due to motion. Supernumerary anterior cerebral artery. POSTERIOR CIRCULATION: No demonstrated RIGHT vertebral artery. LEFT vertebral artery is widely patent. Flow related enhancement bilateral posterior inferior cerebellar arteries. Moderate stenosis proximal basilar artery. Robust LEFT posterior communicating artery present. Moderate tandem stenoses RIGHT posterior cerebral artery. Poor, thready flow related enhancement LEFT posterior-inferior cerebellar artery with LEFT P1 segment severe stenosis. ANATOMIC VARIANTS: None. MRA NECK FINDINGS- mild motion degraded examination. ANTERIOR CIRCULATION: Mild stenosis LEFT Common carotid artery origin. Bilateral internal carotid arteries are widely patent. Mild stenosis LEFT mid cervical internal carotid artery compatible with atherosclerosis. POSTERIOR CIRCULATION: RIGHT vertebral artery is occluded from the origin without significant reconstitution. Moderate luminal irregularity LEFT vertebral artery including the origin. Source images and MIP image were reviewed. IMPRESSION: MRI HEAD: No  acute intracranial process on this motion degraded examination. 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 Moderate to severe chronic small vessel ischemic disease. Moderate global parenchymal brain volume loss, advanced for age. MRA HEAD: Motion degraded examination. RIGHT vertebral artery occlusion, likely old. Severe stenoses bilateral internal carotid arteries due to atherosclerosis. Multifocal moderate stenoses posterior circulation compatible with atherosclerosis. MRA NECK: Occluded RIGHT vertebral artery without significant reconstitution. Moderate atherosclerosis LEFT vertebral artery. No hemodynamically significant stenosis of the carotid arteries. Electronically Signed   By: Elon Alas M.D.   On: 03/29/2016 00:04   Mr Cervical Spine Wo Contrast  Result Date: 03/29/2016 CLINICAL DATA:  61 y/o M; increasing numbness in the right upper and lower extremities. EXAM: MRI CERVICAL SPINE WITHOUT CONTRAST TECHNIQUE: Multiplanar, multisequence MR imaging of the cervical spine was performed. No intravenous contrast was administered. COMPARISON:  03/28/2016 MRI of the head. FINDINGS: Alignment: Straightening of cervical lordosis.  No listhesis. Vertebrae: No fracture, evidence of discitis, or bone lesion. Cord: No abnormal cord signal. Posterior Fossa, vertebral arteries, paraspinal tissues: Pontine and cerebellar chronic lacunar infarcts better seen on prior MR. Disc levels: C2-3: No significant disc displacement, foraminal narrowing, or canal stenosis. C3-4: Right central disc protrusion with anterior cord impingement and flattening. No significant foraminal narrowing. Mild-to-moderate canal stenosis. C4-5: Right foraminal small disc protrusion with uncovertebral and facet hypertrophy. Mild right foraminal narrowing. Mild canal stenosis with slight anterior cord flattening. C5-6: Small disc osteophyte complex and right greater than left uncovertebral and facet hypertrophy. Mild left and  moderate right foraminal narrowing. Mild canal stenosis. Slight anterior cord flattening. C6-7: No significant disc displacement, foraminal narrowing, or canal stenosis. C7-T1: Small disc bulge with no significant foraminal narrowing or canal stenosis. IMPRESSION: 1. No acute osseous abnormality or abnormal cord signal identified. 2. Cervical spondylosis discogenic and facet degenerative changes greatest at the C3-4 level. 3. At C3-4 a right central disc protrusion mildly impinges on the anterior cord with cord flattening and mild-to-moderate canal stenosis. 4. Multiple levels of mild foraminal narrowing and moderate foraminal narrowing at the right C5-6 level. Electronically Signed   By: Kristine Garbe M.D.   On: 03/29/2016 21:31   Dg Chest Port 1 View  Result Date: 03/29/2016 CLINICAL DATA:  Diabetes.  CHF. EXAM: PORTABLE CHEST 1 VIEW COMPARISON:  09/13/2010 . FINDINGS: Cardiomegaly with mild interstitial prominence. Mild component congestive heart failure cannot be excluded. Low lung volumes. Atelectasis. No pleural effusion or pneumothorax. IMPRESSION: 1. Cardiomegaly with very mild interstitial prominence noted bilaterally. Mild CHF cannot be excluded . 2.  Low lung volumes.  Basilar atelectasis. Electronically Signed   By: Marcello Moores  Register   On: 03/29/2016 07:26   Mr Brain Limited Wo Contrast  Result Date: 03/30/2016 CLINICAL DATA:  Initial evaluation for left-sided numbness. EXAM: MRI HEAD WITHOUT CONTRAST TECHNIQUE: Multiplanar, multiecho pulse sequences of the brain and surrounding structures were obtained without intravenous contrast. COMPARISON:  Comparison made with prior MRI from 03/28/2016. FINDINGS: Limited thin-section DWI sequences were performed through the brainstem. Diffusion-weighted imaging demonstrates a small acute ischemic infarct within the left paramedian pons, measuring 9 mm (series 3, image 15). This was not clearly evident on previous MRI. No other evidence for acute or  subacute ischemia on the limited images provided. Adjacent remote lacunar infarcts noted within the brainstem. IMPRESSION: 1. 9 mm acute ischemic infarct within the left paramedian pons. 2. Please note that this is a limited study with only thin section diffusion-weighted imaging performed through the brainstem. Electronically Signed   By: Jeannine Boga M.D.   On: 03/30/2016 23:12   Ct Head Code Stroke W/o Cm  Result Date: 03/28/2016 CLINICAL DATA:  Code stroke.  Right-sided numbness. EXAM: CT HEAD WITHOUT CONTRAST TECHNIQUE: Contiguous axial images were obtained from the base of the skull through the vertex without intravenous contrast. COMPARISON:  09/14/2010 CT head. FINDINGS: Brain: No evidence of acute infarction, hemorrhage, hydrocephalus, extra-axial collection or mass lesion/mass effect. Nonspecific foci of hypoattenuation in subcortical and periventricular white matter are stable and compatible with moderate chronic microvascular ischemic changes. Mild brain parenchymal volume loss. Chronic left pontine lacunar infarct. Small hypodense focus in left caudate head and left lentiform nucleus probably represent chronic lacunar infarcts. Stable mild brain parenchymal volume loss. Vascular: Calcific atherosclerosis of skullbase internal carotid artery is and vertebral arteries. No hyperdense vessel. Skull: Normal. Negative for fracture or focal lesion. Sinuses/Orbits: No acute finding. Other: None. ASPECTS [facility] Stroke Program Early CT Score) - Ganglionic level infarction (caudate, lentiform nuclei, internal capsule, insula, M1-M3 cortex): 7 - Supraganglionic infarction (M4-M6 cortex): 3 Total score (0-10 with 10 being normal): 10 IMPRESSION: 1. No acute intracranial abnormality identified. 2. ASPECTS is 10 3. Stable moderate chronic microvascular ischemic changes of the brain, chronic left basal ganglia and pontine lacunar infarcts, and mild brain parenchymal volume loss. These results were called  by telephone at the time of interpretation on 03/28/2016 at 9:38 pm to Dr. Brantley Stage , who verbally acknowledged these results. Electronically Signed   By: Kristine Garbe M.D.   On: 03/28/2016 21:40    Subjective: No complaints  Discharge Exam: Vitals:   04/03/16 0934 04/03/16 1338  BP: 132/62 (!) 137/47  Pulse: 66 63  Resp: 16 16  Temp: 98.6 F (37 C) 97.9 F (36.6 C)   Vitals:   04/03/16 0119 04/03/16 0506 04/03/16 0934 04/03/16 1338  BP: 130/70 (!) 143/60 132/62 (!) 137/47  Pulse: 75 68 66 63  Resp: 20 18 16 16   Temp: 98.6 F (37 C) 98.3 F (36.8 C) 98.6 F (37 C) 97.9 F (36.6 C)  TempSrc: Oral Oral Oral Axillary  SpO2: 97% 98% 92% 99%  Weight:      Height:        General: Pt is alert, awake, not in acute distress Cardiovascular: RRR, S1/S2 +, no rubs, no gallops Respiratory: CTA bilaterally, no wheezing, no rhonchi Abdominal: Soft, NT, ND, bowel sounds + Extremities: no edema, no cyanosis   The results of significant diagnostics from this hospitalization (including imaging, microbiology, ancillary and laboratory) are listed below for  reference.     Microbiology: Recent Results (from the past 240 hour(s))  Culture, Urine     Status: Abnormal   Collection Time: 04/06/16  6:14 PM  Result Value Ref Range Status   Specimen Description URINE, CLEAN CATCH  Final   Special Requests NONE  Final   Culture (A)  Final    >=100,000 COLONIES/mL PSEUDOMONAS AERUGINOSA 50,000 COLONIES/mL ENTEROCOCCUS FAECALIS    Report Status 04/09/2016 FINAL  Final   Organism ID, Bacteria PSEUDOMONAS AERUGINOSA (A)  Final   Organism ID, Bacteria ENTEROCOCCUS FAECALIS (A)  Final      Susceptibility   Enterococcus faecalis - MIC*    AMPICILLIN <=2 SENSITIVE Sensitive     LEVOFLOXACIN 1 SENSITIVE Sensitive     NITROFURANTOIN <=16 SENSITIVE Sensitive     VANCOMYCIN 1 SENSITIVE Sensitive     * 50,000 COLONIES/mL ENTEROCOCCUS FAECALIS   Pseudomonas aeruginosa - MIC*     CEFTAZIDIME 2 SENSITIVE Sensitive     CIPROFLOXACIN <=0.25 SENSITIVE Sensitive     GENTAMICIN <=1 SENSITIVE Sensitive     IMIPENEM 2 SENSITIVE Sensitive     PIP/TAZO 8 SENSITIVE Sensitive     CEFEPIME 2 SENSITIVE Sensitive     * >=100,000 COLONIES/mL PSEUDOMONAS AERUGINOSA     Labs: BNP (last 3 results) No results for input(s): BNP in the last 8760 hours. Basic Metabolic Panel:  Recent Labs Lab 04/06/16 0415 04/11/16 0516  NA 139 138  K 3.3* 4.0  CL 100* 101  CO2 28 26  GLUCOSE 85 180*  BUN 33* 43*  CREATININE 1.35* 1.51*  CALCIUM 9.0 8.8*   Liver Function Tests: No results for input(s): AST, ALT, ALKPHOS, BILITOT, PROT, ALBUMIN in the last 168 hours. No results for input(s): LIPASE, AMYLASE in the last 168 hours. No results for input(s): AMMONIA in the last 168 hours. CBC:  Recent Labs Lab 04/11/16 0516  WBC 11.9*  NEUTROABS 8.7*  HGB 12.8*  HCT 39.4  MCV 88.9  PLT 306   Cardiac Enzymes: No results for input(s): CKTOTAL, CKMB, CKMBINDEX, TROPONINI in the last 168 hours. BNP: Invalid input(s): POCBNP CBG:  Recent Labs Lab 04/10/16 1640 04/10/16 2121 04/10/16 2204 04/11/16 0626 04/11/16 1158  GLUCAP 127* 55* 93 180* 244*   D-Dimer No results for input(s): DDIMER in the last 72 hours. Hgb A1c No results for input(s): HGBA1C in the last 72 hours. Lipid Profile No results for input(s): CHOL, HDL, LDLCALC, TRIG, CHOLHDL, LDLDIRECT in the last 72 hours. Thyroid function studies No results for input(s): TSH, T4TOTAL, T3FREE, THYROIDAB in the last 72 hours.  Invalid input(s): FREET3 Anemia work up No results for input(s): VITAMINB12, FOLATE, FERRITIN, TIBC, IRON, RETICCTPCT in the last 72 hours. Urinalysis    Component Value Date/Time   COLORURINE YELLOW 04/06/2016 1813   APPEARANCEUR CLEAR 04/06/2016 1813   LABSPEC 1.010 04/06/2016 1813   PHURINE 5.0 04/06/2016 1813   GLUCOSEU NEGATIVE 04/06/2016 1813   HGBUR NEGATIVE 04/06/2016 1813    BILIRUBINUR NEGATIVE 04/06/2016 1813   KETONESUR NEGATIVE 04/06/2016 1813   PROTEINUR NEGATIVE 04/06/2016 1813   UROBILINOGEN 0.2 07/12/2014 1228   NITRITE NEGATIVE 04/06/2016 1813   LEUKOCYTESUR TRACE (A) 04/06/2016 1813   Sepsis Labs Invalid input(s): PROCALCITONIN,  WBC,  LACTICIDVEN Microbiology Recent Results (from the past 240 hour(s))  Culture, Urine     Status: Abnormal   Collection Time: 04/06/16  6:14 PM  Result Value Ref Range Status   Specimen Description URINE, CLEAN CATCH  Final   Special Requests  NONE  Final   Culture (A)  Final    >=100,000 COLONIES/mL PSEUDOMONAS AERUGINOSA 50,000 COLONIES/mL ENTEROCOCCUS FAECALIS    Report Status 04/09/2016 FINAL  Final   Organism ID, Bacteria PSEUDOMONAS AERUGINOSA (A)  Final   Organism ID, Bacteria ENTEROCOCCUS FAECALIS (A)  Final      Susceptibility   Enterococcus faecalis - MIC*    AMPICILLIN <=2 SENSITIVE Sensitive     LEVOFLOXACIN 1 SENSITIVE Sensitive     NITROFURANTOIN <=16 SENSITIVE Sensitive     VANCOMYCIN 1 SENSITIVE Sensitive     * 50,000 COLONIES/mL ENTEROCOCCUS FAECALIS   Pseudomonas aeruginosa - MIC*    CEFTAZIDIME 2 SENSITIVE Sensitive     CIPROFLOXACIN <=0.25 SENSITIVE Sensitive     GENTAMICIN <=1 SENSITIVE Sensitive     IMIPENEM 2 SENSITIVE Sensitive     PIP/TAZO 8 SENSITIVE Sensitive     CEFEPIME 2 SENSITIVE Sensitive     * >=100,000 COLONIES/mL PSEUDOMONAS AERUGINOSA     SIGNED:   CHIU, Orpah Melter, MD  [facility] 04/11/2016, 4:04 PM  If 7PM-7AM, please contact night-coverage [email or web address] Password [email or web address]

## 2016-04-03 NOTE — Care Management Note (Signed)
Case Management Note  Patient Details  Name: [name] MRN: [place]:[id number] Date of Birth: 23-Jul-1955  Subjective/Objective:                    Action/Plan: Pt discharging to CIR today. No further needs per CM.   Expected Discharge Date:   (unknown)               Expected Discharge Plan:  [place]  In-House Referral:     Discharge planning Services     Post Acute Care Choice:    Choice offered to:     DME Arranged:    DME Agency:     HH Arranged:    [place] Agency:     Status of Service:  Completed, signed off  If discussed at [employer] of Stay Meetings, dates discussed:    Additional Comments:  Pollie Friar, RN 04/03/2016, 12:14 PM

## 2016-04-03 NOTE — Care Management Important Message (Signed)
Important Message  Patient Details  Name: [name] MRN: [place]:[id number] Date of Birth: 1955/06/18   Medicare Important Message Given:  Yes    Orbie Pyo 04/03/2016, 12:58 PM

## 2016-04-03 NOTE — Progress Notes (Signed)
James Blake, MD Physician Signed Physical Medicine and Rehabilitation  Consult Note Date of Service: 04/01/2016 11:42 AM  Related encounter: ED to Hosp-Admission (Discharged) from 03/28/2016 in [place] All Collapse All   [] Hide copied text [] Hover for attribution information  Physical Medicine and Rehabilitation Consult Reason for Consult:Eval Rehab needs Referring Phsyician: [name] is an 61 y.o. male.   HPI: 61 year old male with history of chronic systolic CHF, ischemic cardiomyopathy, DVT on Xarelto who was admitted 03/28/2016 with right upper extremity and right lower extremity numbness. Initially evaluated by was a long ED and not felt to be a TPA candidate. Initial blood pressure 204/86. Head, T wave inversions on EKG and troponin of 0.14. Ejection fr 25-30%, inferior lateral akinesis. Coreg was added. 03/28/2016 MRI demonstrated old bilateral thalamic and bilateral basal ganglia lacunar infarcts. Multiple old pontine and cerebellar infarcts. MRI of cervical spine showed facet degenerative changes. Right central disc protrusion C3-C4. MRA showed right greater left carotid siphon stenosis, moderate basilar artery stenosis On 03/30/2016. Neurology was consulted, note of a severe bilateral peripheral neuropathy with absent sensation to the knees Repeat MRI of the brain was ordered to 04/17/2016 demonstrate left paramedian pontine infarct. 9 mm acute  PT note 03/31/2016, bed mobility, supervision to min assist, plus 2 assist with standing.  Review of Systems  Constitutional: Negative for chills and fever.  HENT: Negative for congestion.   Eyes: Positive for blurred vision.  Respiratory: Negative for cough, shortness of breath, wheezing and stridor.   Cardiovascular: Positive for leg swelling. Negative for chest pain.  Gastrointestinal: Negative for abdominal pain, nausea and vomiting.  Genitourinary: Negative for dysuria.   Musculoskeletal: Negative for back pain and neck pain.  Skin: Negative for itching.  Neurological: Positive for focal weakness. Negative for headaches.  Endo/Heme/Allergies: Bruises/bleeds easily.  Psychiatric/Behavioral: Positive for memory loss. The patient does not have insomnia.         Past Medical History:  Diagnosis Date  . Adrenal insufficiency ([place])   . Allergy   . Arthritis   . Blind left eye   . Carpal tunnel syndrome   . Cataract    bil removed  . CHF (congestive heart failure) ([place])   . Diabetes mellitus without complication ([place])   . ED (erectile dysfunction)   . Gout   . Herpes   . Hypercholesterolemia   . Hypertension   . MI (myocardial infarction)   . Peripheral neuropathy ([place])   . Pneumonia   . PVD (peripheral vascular disease) ([place])         Past Surgical History:  Procedure Laterality Date  . CHOLECYSTECTOMY    . dilatera cataracts removed    . right knee surgary    . stents           Family History  Problem Relation Age of Onset  . Breast cancer Mother   . Hypertension Father     blood clot in leg  . Diabetes Maternal Aunt   . Heart attack Sister   . Colon cancer Neg Hx   . Esophageal cancer Neg Hx   . Rectal cancer Neg Hx   . Stomach cancer Neg Hx    Social History:  reports that he has never smoked. He has never used smokeless tobacco. He reports that he does not drink alcohol or use drugs. Allergies:  Allergies  Allergen Reactions  . Iohexol     hives  .  Ivp Dye [Iodinated Diagnostic Agents]     hives         Medications Prior to Admission  Medication Sig Dispense Refill  . cloNIDine (CATAPRES) 0.2 MG tablet Take 1 tablet (0.2 mg total) by mouth 3 (three) times daily. 90 tablet 0  . furosemide (LASIX) 80 MG tablet Take 80 mg by mouth daily.     . insulin NPH-regular (NOVOLIN 70/30) (70-30) 100 UNIT/ML injection Inject into the skin. 52 units in the morning and 32 units in the  evening    . losartan-hydrochlorothiazide (HYZAAR) 100-12.5 MG per tablet Take 1 tablet by mouth daily.    . metFORMIN (GLUCOPHAGE) 1000 MG tablet Take 1,000 mg by mouth 2 (two) times daily with a meal.    . metoprolol tartrate (LOPRESSOR) 25 MG tablet Take 25 mg by mouth 2 (two) times daily.    . rivaroxaban (XARELTO) 20 MG TABS tablet Take 20 mg by mouth daily.    Marland Kitchen triamcinolone ointment (KENALOG) 0.1 % Apply 1 application topically 2 (two) times daily as needed (rash/irritation on legs).     . cephALEXin (KEFLEX) 500 MG capsule Take 1 capsule (500 mg total) by mouth 3 (three) times daily. (Patient not taking: Reported on 07/12/2014) 24 capsule 0  . hydrALAZINE (APRESOLINE) 25 MG tablet Take 1 tablet (25 mg total) by mouth 3 (three) times daily. (Patient not taking: Reported on 03/28/2016) 90 tablet 1  . hydrOXYzine (ATARAX/VISTARIL) 25 MG tablet Take 1 tablet (25 mg total) by mouth 3 (three) times daily as needed for itching (refractory to benadryl). (Patient not taking: Reported on 07/12/2014) 30 tablet 0  . ondansetron (ZOFRAN ODT) 4 MG disintegrating tablet Take 1 tablet (4 mg total) by mouth every 6 (six) hours as needed for nausea or vomiting. (Patient not taking: Reported on 03/28/2016) 12 tablet 0    Home: Home Living Family/patient expects to be discharged to:: Private residence Living Arrangements: Alone Available Help at Discharge: Family, Available PRN/intermittently Type of Home: Apartment Home Access: Stairs to enter [employer] of Steps: 18 steps "have to go up then down to get into my apartment" Can enter through sliding door without any steps, but someone has to be with him to go into apartment and unlock slilding patio door. Entrance Stairs-Rails: Right, Left Home Layout: One level Bathroom Shower/Tub: Tub/shower unit, Architectural technologist: Handicapped height Bathroom Accessibility: Yes Home Equipment: None  Functional History: Prior  Function Comments: doesn't drive, pt with hx of decreased vision L eye Functional Status:  Mobility: Ambulation/Gait Ambulation Distance (Feet): 30 Feet General Gait Details: Pt with decreased R step length and difficulty coordinating RW and steps with narrow BOS.  Posterior tendency and cues for forward weight shift.   ADL: ADL Grooming Details (indicate cue type and reason): difficulty with manipulating objects  Cognition: Cognition Overall Cognitive Status: Within Functional Limits for tasks assessed Orientation Level: Oriented X4 Cognition Arousal/Alertness: Awake/alert Behavior During Therapy: WFL for tasks assessed/performed Overall Cognitive Status: Within Functional Limits for tasks assessed  Blood pressure (!) 161/72, pulse 76, temperature 99 F (37.2 C), temperature source Oral, resp. rate 16, height 5\' 9"  (1.753 m), weight 111.1 kg (245 lb), SpO2 95 %. Physical Exam  Nursing note and vitals reviewed. Constitutional: He is oriented to person, place, and time. He appears well-developed and well-nourished.  HENT:  Head: Normocephalic and atraumatic.  Eyes: EOM are normal.  Decreased visual acuity, right eye, blind in left eye  Neck: Normal range of motion. Neck supple. JVD  present. No tracheal deviation present.  Cardiovascular: Normal rate, regular rhythm and normal heart sounds.   No murmur heard. Respiratory: Effort normal and breath sounds normal. No respiratory distress. He has no wheezes.  GI: Soft. Bowel sounds are normal. He exhibits no distension. There is no tenderness.  Neurological: He is alert and oriented to person, place, and time. No sensory deficit. Coordination and gait abnormal.  4 plus, right deltoid, bicep, triceps, grip, hip flexion, extensor, ankle dorsiflexor 5/5 left deltoid, bicep press up, grip, hip flexion, knee extension, ankle dorsi flexion. Mild dysmetria finger-nose-finger on the right Sensation intact to light touch bilateral upper  and lower limbs    Lab Results Last 24 Hours       Results for orders placed or performed during the hospital encounter of 03/28/16 (from the past 24 hour(s))  Glucose, capillary     Status: Abnormal   Collection Time: 03/31/16  4:43 PM  Result Value Ref Range   Glucose-Capillary 292 (H) 65 - 99 mg/dL  Glucose, capillary     Status: Abnormal   Collection Time: 03/31/16  9:45 PM  Result Value Ref Range   Glucose-Capillary 263 (H) 65 - 99 mg/dL   Comment 1 Notify RN    Comment 2 Document in Chart   Basic metabolic panel     Status: Abnormal   Collection Time: 04/01/16  2:21 AM  Result Value Ref Range   Sodium 139 135 - 145 mmol/L   Potassium 3.5 3.5 - 5.1 mmol/L   Chloride 105 101 - 111 mmol/L   CO2 26 22 - 32 mmol/L   Glucose, Bld 240 (H) 65 - 99 mg/dL   BUN 18 6 - 20 mg/dL   Creatinine, Ser 1.49 (H) 0.61 - 1.24 mg/dL   Calcium 9.0 8.9 - 10.3 mg/dL   GFR calc non Af Amer 49 (L) >60 mL/min   GFR calc Af Amer 57 (L) >60 mL/min   Anion gap 8 5 - 15  Magnesium     Status: None   Collection Time: 04/01/16  2:21 AM  Result Value Ref Range   Magnesium 1.9 1.7 - 2.4 mg/dL  Glucose, capillary     Status: Abnormal   Collection Time: 04/01/16  6:33 AM  Result Value Ref Range   Glucose-Capillary 215 (H) 65 - 99 mg/dL   Comment 1 Notify RN    Comment 2 Document in Chart       Imaging Results (Last 48 hours)  Mr Brain Limited Wo Contrast  Result Date: 03/30/2016 CLINICAL DATA:  Initial evaluation for left-sided numbness. EXAM: MRI HEAD WITHOUT CONTRAST TECHNIQUE: Multiplanar, multiecho pulse sequences of the brain and surrounding structures were obtained without intravenous contrast. COMPARISON:  Comparison made with prior MRI from 03/28/2016. FINDINGS: Limited thin-section DWI sequences were performed through the brainstem. Diffusion-weighted imaging demonstrates a small acute ischemic infarct within the left paramedian pons, measuring 9 mm (series 3,  image 15). This was not clearly evident on previous MRI. No other evidence for acute or subacute ischemia on the limited images provided. Adjacent remote lacunar infarcts noted within the brainstem. IMPRESSION: 1. 9 mm acute ischemic infarct within the left paramedian pons. 2. Please note that this is a limited study with only thin section diffusion-weighted imaging performed through the brainstem. Electronically Signed   By: Jeannine Boga M.D.   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 Does the need for close, 24 hr/day medical  supervision in concert with the patient's rehab needs make it unreasonable for this patient to be served in a less intensive setting? Yes 2. Co-Morbidities requiring supervision/potential complications: Blindness left eye, diabetes, CHF 3. Due to bladder management, bowel management, safety, skin/wound care, disease management, medication administration, pain management and patient education, does the patient require 24 hr/day rehab nursing? Yes 4. Does the patient require coordinated care of a physician, rehab nurse, PT (1-2 hrs/day, 5 days/week), OT (1-2 hrs/day, 5 days/week) and SLP (.5-1 hrs/day, 5 days/week) to address physical and functional deficits in the context of the above medical diagnosis(es)? Yes Addressing deficits in the following areas: balance, endurance, locomotion, strength, transferring, bowel/bladder control, bathing, dressing, feeding, grooming, toileting and cognition 5. Can the patient actively participate in an intensive therapy program of at least 3 hrs of therapy per day at least 5 days per week? Yes 6. The potential for patient to make measurable gains while on inpatient rehab is good 7. Anticipated functional outcomes upon discharge from inpatients are pre gait, gait training, endurance , safety, equipment, neuromuscular re education PT, ADLs, Cognitive perceptual skills,  Neuromuscular re education, safety, endurance, equipment OT, a recognition SLP 8. Estimated rehab length of stay to reach the above functional goals is: 14-17d 9. Does the patient have adequate social supports to accommodate these discharge functional goals? Yes 10. Anticipated D/C setting: Home 11. Anticipated post D/C treatments: [facility] therapy 12. Overall Rehab/Functional Prognosis: good  RECOMMENDATIONS: This patient's condition is appropriate for continued rehabilitative care in the following setting: CIR Patient has agreed to participate in recommended program. Yes Note that insurance prior authorization may be required for reimbursement for recommended care.  Comment:   [name] 04/01/2016     Routing History

## 2016-04-03 NOTE — Progress Notes (Signed)
Please let cardiology team know when patient is discharged so we can arrange follow-up. You can send a staff message to [employer] with request or call cardmaster. Call with questions. Denaja Verhoeven PA-C

## 2016-04-03 NOTE — PMR Pre-admission (Signed)
PMR Admission Coordinator Pre-Admission Assessment  Patient: [name] is an 61 y.o., male MRN: [id number] DOB: October 24, 1955 Height: _0  (175.3 cm) Weight: 111.1 kg (245 lb)              Insurance Information HMO:     PPO:      PCP:      IPA:      80/20:      OTHER:  PRIMARY: Medicare A and B      Policy#:  [id number]      Subscriber:  self CM Name:        Phone#:      Fax#:  Pre-Cert#:       Employer: retired Benefits:  Phone #:      Name:  Eff. Date: 03/29/08     Deduct:  $1340      Out of Pocket Max:  n/a      Life Max:  n/a CIR:  100% after deductible met      SNF:  100% first 20 days Outpatient:  80%     Co-Pay: 20% Home Health:  100%      Co-Pay:   DME:  80%     Co-Pay:  20% Providers:  Pt. choice SECONDARY:       Policy#:       Subscriber:  CM Name:       Phone#:      Fax#:  Pre-Cert#:       Employer:  Benefits:  Phone #:      Name:  Eff. Date:      Deduct:       Out of Pocket Max:       Life Max:  CIR:       SNF:  Outpatient:      Co-Pay:  Home Health:       Co-Pay:  DME:      Co-Pay:   Medicaid Application Date:       Case Manager:  Disability Application Date:       Case Worker:   Emergency Contact Information Contact Information    Name Relation Home Work [name] Daughter [phone]     [name],[name] Daughter [phone]  [phone]   [name],[name] Daughter   [phone]     Current Medical History  Patient Admitting Diagnosis: Left paramedian pontine infarct with right hemiataxia History of Present Illness: [name] is a 61 year old male with history of CAD, with ICM, diastolic CHF, D6QI, peripheral neuropathy, DVT- on Xarelto who was admitted on 03/28/16 with right sided numbness. He reported running out of Xarelto for 3 days PTA.  BP elevated in ED and patient with ST changes in inferolateral leads. UDS negative.  2 D echo done revealing mild LVH with global hypokinesis with akinesis of inferolateral wall  and EF 25- 30% with grade  1. Dr. Warren Johnson recommended outpatient perfusion study and Bidil and coreg added for management of cardiomyopathy.  MRI brain done revealing old bilateral thalamic infarcts, bilateral basal ganglia lacunar infarcts, multiple old pontine and cerebellar infarcts. MRA head/neck done revealing severe stenosis B-ICA with multifocal moderate stenosis posterior circulation and occluded R-VA without significant reconstitution.  MRI cervical spine showed cervical spondylosis with discogenic and facet degenerative changes with right central disc protrusion C3-C4. Neurology consulted for input on severe bilateral peripheral neuropathy with absent sensation to knees. Repeat MRI brain done revealing 9 mm acute ischemic infarct within left paramedian pons. Therapy evaluations done revealing right sided  weakness with vision deficits, unsteady gait with inability to advance RLE and ataxic gait. CIR recommended for follow up therapy Total: 6 NIH    Past Medical History  Past Medical History:  Diagnosis Date  . Adrenal insufficiency ([place])   . Allergy   . Arthritis   . Blind left eye   . Carpal tunnel syndrome   . Cataract    bil removed  . CHF (congestive heart failure) ([place])   . Diabetes mellitus without complication ([place])   . ED (erectile dysfunction)   . Gout   . Herpes   . Hypercholesterolemia   . Hypertension   . MI (myocardial infarction)   . Peripheral neuropathy ([place])   . Pneumonia   . PVD (peripheral vascular disease) ([place])     Family History  family history includes Breast cancer in his mother; Diabetes in his maternal aunt; Heart attack in his sister; Hypertension in his father.  Prior Rehab/Hospitalizations:  Has the patient had major surgery during 100 days prior to admission? No  Current Medications   Current Facility-Administered Medications:  .   stroke: mapping our early stages of recovery book, , Does not apply, Once, James Patience, MD .  acetaminophen (TYLENOL) tablet 650  mg, 650 mg, Oral, Q4H PRN, 650 mg at 03/30/16 1943 **OR** acetaminophen (TYLENOL) solution 650 mg, 650 mg, Per Tube, Q4H PRN **OR** acetaminophen (TYLENOL) suppository 650 mg, 650 mg, Rectal, Q4H PRN, James Patience, MD .  atorvastatin (LIPITOR) tablet 40 mg, 40 mg, Oral, q1800, James L Rinehuls, PA-C, 40 mg at 04/02/16 1703 .  bismuth subsalicylate (PEPTO BISMOL) 262 MG/15ML suspension 30 mL, 30 mL, Oral, Q4H PRN, James Hillock, MD, 30 mL at 03/29/16 1222 .  carvedilol (COREG) tablet 25 mg, 25 mg, Oral, BID WC, James Leas, NP, 25 mg at 04/03/16 0809 .  chlorthalidone (HYGROTON) tablet 25 mg, 25 mg, Oral, Daily, James Leas, NP, 25 mg at 04/03/16 1006 .  cloNIDine (CATAPRES) tablet 0.3 mg, 0.3 mg, Oral, TID, James Hazel, MD, 0.3 mg at 04/03/16 1006 .  furosemide (LASIX) tablet 80 mg, 80 mg, Oral, Daily, James Patience, MD, 80 mg at 04/03/16 1005 .  insulin aspart (novoLOG) injection 0-9 Units, 0-9 Units, Subcutaneous, TID WC, James Patience, MD, 1 Units at 04/03/16 1255 .  insulin aspart protamine- aspart (NOVOLOG MIX 70/30) injection 35 Units, 35 Units, Subcutaneous, Q supper, James Hazel, MD, 35 Units at 04/02/16 1705 .  insulin aspart protamine- aspart (NOVOLOG MIX 70/30) injection 60 Units, 60 Units, Subcutaneous, Q breakfast, James Hazel, MD, 60 Units at 04/03/16 0809 .  isosorbide-hydrALAZINE (BIDIL) 20-37.5 MG per tablet 2 tablet, 2 tablet, Oral, TID, James Breeding, MD, 2 tablet at 04/03/16 1005 .  losartan (COZAAR) tablet 100 mg, 100 mg, Oral, Daily, James Patience, MD, 100 mg at 04/03/16 1006 .  potassium chloride SA (K-DUR,KLOR-CON) CR tablet 40 mEq, 40 mEq, Oral, BID, James Hazel, MD, 40 mEq at 04/03/16 1006 .  rivaroxaban (XARELTO) tablet 20 mg, 20 mg, Oral, Q supper, [name], [name], 20 mg at 04/02/16 1703 .  senna-docusate (Senokot-S) tablet 1 tablet, 1 tablet, Oral, Daily, James Hazel, MD, 1 tablet at 04/03/16 1005  Patients Current Diet:  Diet Carb Modified Fluid consistency: Thin; Room service appropriate? Yes  Precautions / Restrictions Precautions Precautions: Fall Restrictions Weight Bearing Restrictions: No   Has the patient had 2 or more falls or a fall with injury in  the past year?No  Prior Activity Level Limited Community (1-2x/wk): Daughter Velna Hatchet reports pt. goes out of the home with her 2-3 x./ week for church, and errands.  Additionally, pt. takes cab to grocery store weekly.  Pt. does not drive.  Home Assistive Devices / Equipment Home Assistive Devices/Equipment: Eyeglasses Home Equipment: None  Prior Device Use: Indicate devices/aids used by the patient prior to current illness, exacerbation or injury? None of the above  Prior Functional Level Prior Function Level of Independence: Independent Comments: doesn't drive, pt with hx of decreased vision L eye  Self Care: Did the patient need help bathing, dressing, using the toilet or eating?  Independent  Indoor Mobility: Did the patient need assistance with walking from room to room (with or without device)? Independent  Stairs: Did the patient need assistance with internal or external stairs (with or without device)? Needed some help; daughter reports she would supervise pt. While he negotiated steps  Functional Cognition: Did the patient need help planning regular tasks such as shopping or remembering to take medications? Independent  Current Functional Level Cognition  Overall Cognitive Status: Impaired/Different from baseline Orientation Level: Oriented X4 Following Commands: Follows one step commands with increased time Safety/Judgement: Decreased awareness of safety General Comments: Pt with good awareness of deficits and able to verbalize problems as they occur. Continues to require VC's for sequencing and increased time for processing.    Extremity Assessment (includes Sensation/Coordination)  Upper Extremity Assessment: RUE  deficits/detail RUE Deficits / Details: ataxia; poor in hand manipulation skills; numbness throughout RUE,, greater in R hand RUE Sensation: decreased light touch, decreased proprioception RUE Coordination: decreased fine motor, decreased gross motor  Lower Extremity Assessment: Defer to PT evaluation RLE Deficits / Details: hip flex 4/5, quads 5/5. major complaint is numbness RLE Sensation: decreased light touch    ADLs  Overall ADL's : Needs assistance/impaired Eating/Feeding: Set up, Supervision/ safety, Sitting Grooming: Wash/dry hands, Wash/dry face, Oral care, Supervision/safety, Set up, Bed level Grooming Details (indicate cue type and reason): difficulty with manipulating objects Upper Body Bathing: Minimal assistance Lower Body Bathing: Maximal assistance, Sit to/from stand Upper Body Dressing : Minimal assistance, Cueing for sequencing Upper Body Dressing Details (indicate cue type and reason): VC's required for problem solving through task as well as for hemidressing techniques. Lower Body Dressing: Maximal assistance, Sit to/from stand Toilet Transfer: Moderate assistance, +2 for physical assistance, Stand-pivot Toilet Transfer Details (indicate cue type and reason): Attempted sit<>stand for ambulation x3 with max assist +2. Pt unsafe to attempt ambulation but was able to complete simulated stand-pivot toilet transfer with mod assist +2. Toileting- Clothing Manipulation and Hygiene: Moderate assistance Functional mobility during ADLs: +2 for physical assistance General ADL Comments: VC's for hemidressing technique.  Continues to require verbal, visual, and tactile cues for sequencing of tasks.    Mobility  Overal bed mobility: Needs Assistance Bed Mobility: Supine to Sit Supine to sit: Supervision Sit to supine: Supervision, Min assist General bed mobility comments: No physical assistance required to transition to EOB. He did demonstrate heavy use of bed rails and required  increased time.    Transfers  Overall transfer level: Needs assistance Equipment used: Rolling walker (2 wheeled), 2 person hand held assist Transfers: Sit to/from Stand, Stand Pivot Transfers Sit to Stand: Max assist, +2 physical assistance, From elevated surface Stand pivot transfers: Mod assist, +2 physical assistance, From elevated surface General transfer comment: Pt stood x2 with RW for support and +2 max assist for balance support. Significant anterior  lean noted with cues required for upright posture. SPT was completed with +2 mod assist and no walker. Pt was able to take pivotal steps around to the chair.     Ambulation / Gait / Stairs / Wheelchair Mobility  Ambulation/Gait Ambulation/Gait assistance: Mod assist Ambulation Distance (Feet): 30 Feet Assistive device: Rolling walker (2 wheeled) Gait Pattern/deviations: Decreased step length - right, Narrow base of support, Ataxic General Gait Details: Pt was unsafe to attempt at this time.     Posture / Balance Dynamic Sitting Balance Sitting balance - Comments: Able to statically sit briefly without UE support.  Balance Overall balance assessment: Needs assistance Sitting-balance support: Bilateral upper extremity supported Sitting balance-Leahy Scale: Fair Sitting balance - Comments: Able to statically sit briefly without UE support.  Standing balance support: Bilateral upper extremity supported, During functional activity Standing balance-Leahy Scale: Zero Standing balance comment: +2 required    Special needs/care consideration BiPAP/CPAP   no CPM   no Continuous Drip IV   no Dialysis   no        Life Vest   no Oxygen   no Special Bed   no Trach Size   n/a Wound Vac (area)   no       Skin   WDL per nursing assessment                               Bowel mgmt:   Last BM PTA per pt.  Bladder mgmt:   continent Diabetic mgmt  Home management with metformin and insulin     Previous Home Environment Living  Arrangements: Alone Available Help at Discharge: Family, Available PRN/intermittently Type of Home: Apartment Home Layout: One level Home Access: Stairs to enter Entrance Stairs-Rails: Right, Left Entrance Stairs-Number of Steps: 18 steps "have to go up then down to get into my apartment" Can enter through sliding door without any steps, but someone has to be with him to go into apartment and unlock slilding patio door. Bathroom Shower/Tub: Public librarian, Architectural technologist: Handicapped height Bathroom Accessibility: Yes How Accessible: Accessible via walker Superior: No  Discharge Living Setting Plans for Discharge Living Setting: Patient's home Type of Home at Discharge: Apartment Discharge Home Layout: One level Discharge Home Access: Stairs to enter Entrance Stairs-Rails: Right, Left Entrance Stairs-Number of Steps:  (flight up then down to enter apartment unless he enters thru sliding glass door (family would have to enter thru normal route to open sliding glass door for pt.) Discharge Bathroom Shower/Tub: Tub/shower unit Discharge Bathroom Toilet: Handicapped height Discharge Bathroom Accessibility: Yes How Accessible: Accessible via walker Does the patient have any problems obtaining your medications?: No  Social/Family/Support Systems Patient Roles: Parent (has 2 daughters Velna Hatchet and Grant) Anticipated Caregiver: Darcel Smalling, daughter and Myrlene Broker, daughter Anticipated Caregiver's Contact Information: [name] [phone]; Velna Hatchet [phone] Ability/Limitations of Caregiver: [name] works during the day in her home as an [place] works 3rd shift .   Caregiver Availability: Intermittent Discharge Plan Discussed with Primary Caregiver: Yes Is Caregiver In Agreement with Plan?: Yes Does Caregiver/Family have Issues with Lodging/Transportation while Pt is in Rehab?: No   Goals/Additional Needs Patient/Family Goal for Rehab: supervision and  minimal assistance PT/OT/SLP Expected length of stay: 14-17 days Cultural Considerations: n/a Dietary Needs: carb modified diet, thin liquids Equipment Needs: TBA Additional Information: There seems to be a distance between pt's 2 daughters and they did not appear to communicate  when in the same room with their dad.  [name] seems to take the lead role in their dad's affairs.  We discussed at length  that pt. is expected to need  min assist/supervision upon DC from rehab.  [name] seems to have a concern that the family cannot provide 24/7 and both daughters seem to think pt. may need SNF following CIR.  They have requested pt. to start at CIR to see how he progresses.  They have also been informed that if pt. progresses to a supervision level, he would likely not qualify for SNF. Pt/Family Agrees to Admission and willing to participate: Yes Program Orientation Provided & Reviewed with Pt/Caregiver Including Roles  & Responsibilities: Yes   Decrease burden of Care through IP rehab admission: n/a   Possible need for SNF placement upon discharge:  SNF is a possibility depending on pt's progress while on CIR   Patient Condition: This patient's medical and functional status has changed since the consult dated: 04/01/16 in which the Rehabilitation Physician determined and documented that the patient's condition is appropriate for intensive rehabilitative care in an inpatient rehabilitation facility. See "History of Present Illness" (above) for medical update. Functional changes are: pt. at a +2 mod and max assist for transfers; max assist of 2 for balance support in standing, cueing for problem solving for upper body dressing at min assist level; max assist lower body dressing  . Patient's medical and functional status update has been discussed with the Rehabilitation physician and patient remains appropriate for inpatient rehabilitation. Will admit to inpatient rehab today.  Preadmission Screen Completed  By:  Gerlean Ren, 04/03/2016 1:44 PM ______________________________________________________________________   Discussed status with Dr. Naaman Plummer on 04/03/16 at  1343  and received telephone approval for admission today.  Admission Coordinator:  Gerlean Ren, time 5929 /Date 04/03/16

## 2016-04-03 NOTE — Progress Notes (Signed)
Inpatient Rehabilitation  I have spoken with pt's two daughters, [name] and [place].  [name] is unsure that family will be able to provide support following a CIR admission, however she states "we want to do what is best for my dad".  She and family have elected CIR.  I have spoken with Dr. Wyline Copas who gives medical clearance for admission today.  I have updated Jorge Ny, CSW, Durenda Guthrie, RNCM  and pt's RN Raquel Sarna of the plan as well.  I will make all arrangements necessary for admission later today.  Please call if questions.  [facility] Admissions Coordinator Cell [phone] Office [phone]

## 2016-04-03 NOTE — Progress Notes (Signed)
CSW was following for potential SNF back up to CIR- spoke to dtr this morning who was agreeable to SNF (hopeful for [facility]) if CIR could not accept.  CIR admissions coordinator informed CSW that they would accept patient today for rehab  CSW signing off  Jorge Ny, [place] Social Worker [phone]

## 2016-04-03 NOTE — Progress Notes (Signed)
[name] [facility] Admission Coordinator Signed Physical Medicine and Rehabilitation  PMR Pre-admission Date of Service: 04/03/2016 1:31 PM  Related encounter: ED to Hosp-Admission (Discharged) from 03/28/2016 in [facility]       '[]'$ Hide copied text PMR Admission Coordinator Pre-Admission Assessment  Patient: [name] is an 61 y.o., male MRN: [id number] DOB: 1955-06-11 Height: '5\' 9"'$  (175.3 cm) Weight: 111.1 kg (245 lb)                                                                                                                                                  Insurance Information HMO:     PPO:      PCP:      IPA:      80/20:      OTHER:  PRIMARY: Medicare A and B      Policy#:  [id number]      Subscriber:  self CM Name:        Phone#:      Fax#:  Pre-Cert#:       Employer: retired Benefits:  Phone #:      Name:  Eff. Date: 03/29/08     Deduct:  $1340      Out of Pocket Max:  n/a      Life Max:  n/a CIR:  100% after deductible met      SNF:  100% first 20 days Outpatient:  80%     Co-Pay: 20% Home Health:  100%      Co-Pay:   DME:  80%     Co-Pay:  20% Providers:  Pt. choice SECONDARY:       Policy#:       Subscriber:  CM Name:       Phone#:      Fax#:  Pre-Cert#:       Employer:  Benefits:  Phone #:      Name:  Eff. Date:      Deduct:       Out of Pocket Max:       Life Max:  CIR:       SNF:  Outpatient:      Co-Pay:  Home Health:       Co-Pay:  DME:      Co-Pay:   Medicaid Application Date:       Case Manager:  Disability Application Date:       Case Worker:   Emergency Contact Information        Contact Information    Name Relation Home Work [place] Daughter [phone]     Limon-Terry,Nichole Daughter [phone]  [phone]   [place]ston,Brandi Daughter   [phone]     Current Medical History  Patient Admitting Diagnosis: Left paramedian pontine infarct with right hemiataxia History of Present  Illness: [name] is  a 61 year old male with history of CAD, with ICM, diastolic CHF, Q0HK, peripheral neuropathy, DVT- on Xarelto who was admitted on 03/28/16 with right sided numbness. He reported running out of Xarelto for 3 days PTA. BP elevated in ED and patient with ST changes in inferolateral leads. UDS negative. 2 D echo done revealing mild LVH with global hypokinesis with akinesis of inferolateral wall and EF 25- 30% with grade 1. Dr. Warren Lacy recommended outpatient perfusion study and Bidil and coreg added for management of cardiomyopathy. MRI brain done revealing old bilateral thalamic infarcts, bilateral basal ganglia lacunar infarcts, multiple old pontine and cerebellar infarcts. MRA head/neck done revealing severe stenosis B-ICA with multifocal moderate stenosis posterior circulation and occluded R-VA without significant reconstitution. MRI cervical spine showed cervical spondylosis with discogenic and facet degenerative changes with right central disc protrusion C3-C4. Neurology consulted for input on severe bilateral peripheral neuropathy with absent sensation to knees. Repeat MRI brain done revealing 9 mm acute ischemic infarct within left paramedian pons. Therapy evaluations done revealing right sided weakness with vision deficits, unsteady gait with inability to advance RLE and ataxic gait. CIR recommended for follow up therapy Total: 6 NIH  Past Medical History      Past Medical History:  Diagnosis Date  . Adrenal insufficiency ([place])   . Allergy   . Arthritis   . Blind left eye   . Carpal tunnel syndrome   . Cataract    bil removed  . CHF (congestive heart failure) ([place])   . Diabetes mellitus without complication ([place])   . ED (erectile dysfunction)   . Gout   . Herpes   . Hypercholesterolemia   . Hypertension   . MI (myocardial infarction)   . Peripheral neuropathy ([place])   . Pneumonia   . PVD (peripheral vascular disease) ([place])     Family  History  family history includes Breast cancer in his mother; Diabetes in his maternal aunt; Heart attack in his sister; Hypertension in his father.  Prior Rehab/Hospitalizations:  Has the patient had major surgery during 100 days prior to admission? No  Current Medications   Current Facility-Administered Medications:  .   stroke: mapping our early stages of recovery book, , Does not apply, Once, Rise Patience, MD .  acetaminophen (TYLENOL) tablet 650 mg, 650 mg, Oral, Q4H PRN, 650 mg at 03/30/16 1943 **OR** acetaminophen (TYLENOL) solution 650 mg, 650 mg, Per Tube, Q4H PRN **OR** acetaminophen (TYLENOL) suppository 650 mg, 650 mg, Rectal, Q4H PRN, Rise Patience, MD .  atorvastatin (LIPITOR) tablet 40 mg, 40 mg, Oral, q1800, David L Rinehuls, PA-C, 40 mg at 04/02/16 1703 .  bismuth subsalicylate (PEPTO BISMOL) 262 MG/15ML suspension 30 mL, 30 mL, Oral, Q4H PRN, Oswald Hillock, MD, 30 mL at 03/29/16 1222 .  carvedilol (COREG) tablet 25 mg, 25 mg, Oral, BID WC, Arbutus Leas, NP, 25 mg at 04/03/16 0809 .  chlorthalidone (HYGROTON) tablet 25 mg, 25 mg, Oral, Daily, Arbutus Leas, NP, 25 mg at 04/03/16 1006 .  cloNIDine (CATAPRES) tablet 0.3 mg, 0.3 mg, Oral, TID, Donne Hazel, MD, 0.3 mg at 04/03/16 1006 .  furosemide (LASIX) tablet 80 mg, 80 mg, Oral, Daily, Rise Patience, MD, 80 mg at 04/03/16 1005 .  insulin aspart (novoLOG) injection 0-9 Units, 0-9 Units, Subcutaneous, TID WC, Rise Patience, MD, 1 Units at 04/03/16 1255 .  insulin aspart protamine- aspart (NOVOLOG MIX 70/30) injection 35 Units, 35 Units, Subcutaneous, Q supper, Donne Hazel,  MD, 35 Units at 04/02/16 1705 .  insulin aspart protamine- aspart (NOVOLOG MIX 70/30) injection 60 Units, 60 Units, Subcutaneous, Q breakfast, Donne Hazel, MD, 60 Units at 04/03/16 0809 .  isosorbide-hydrALAZINE (BIDIL) 20-37.5 MG per tablet 2 tablet, 2 tablet, Oral, TID, Minus Breeding, MD, 2 tablet at 04/03/16 1005 .   losartan (COZAAR) tablet 100 mg, 100 mg, Oral, Daily, Rise Patience, MD, 100 mg at 04/03/16 1006 .  potassium chloride SA (K-DUR,KLOR-CON) CR tablet 40 mEq, 40 mEq, Oral, BID, Donne Hazel, MD, 40 mEq at 04/03/16 1006 .  rivaroxaban (XARELTO) tablet 20 mg, 20 mg, Oral, Q supper, Berton Mount, RPH, 20 mg at 04/02/16 1703 .  senna-docusate (Senokot-S) tablet 1 tablet, 1 tablet, Oral, Daily, Donne Hazel, MD, 1 tablet at 04/03/16 1005  Patients Current Diet: Diet Carb Modified Fluid consistency: Thin; Room service appropriate? Yes  Precautions / Restrictions Precautions Precautions: Fall Restrictions Weight Bearing Restrictions: No   Has the patient had 2 or more falls or a fall with injury in the past year?No  Prior Activity Level Limited Community (1-2x/wk): Daughter Velna Hatchet reports pt. goes out of the home with her 2-3 x./ week for church, and errands.  Additionally, pt. takes cab to grocery store weekly.  Pt. does not drive.  Home Assistive Devices / Equipment Home Assistive Devices/Equipment: Eyeglasses Home Equipment: None  Prior Device Use: Indicate devices/aids used by the patient prior to current illness, exacerbation or injury? None of the above  Prior Functional Level Prior Function Level of Independence: Independent Comments: doesn't drive, pt with hx of decreased vision L eye  Self Care: Did the patient need help bathing, dressing, using the toilet or eating?  Independent  Indoor Mobility: Did the patient need assistance with walking from room to room (with or without device)? Independent  Stairs: Did the patient need assistance with internal or external stairs (with or without device)? Needed some help; daughter reports she would supervise pt. While he negotiated steps  Functional Cognition: Did the patient need help planning regular tasks such as shopping or remembering to take medications? Independent  Current Functional Level Cognition   Overall Cognitive Status: Impaired/Different from baseline Orientation Level: Oriented X4 Following Commands: Follows one step commands with increased time Safety/Judgement: Decreased awareness of safety General Comments: Pt with good awareness of deficits and able to verbalize problems as they occur. Continues to require VC's for sequencing and increased time for processing.    Extremity Assessment (includes Sensation/Coordination)  Upper Extremity Assessment: RUE deficits/detail RUE Deficits / Details: ataxia; poor in hand manipulation skills; numbness throughout RUE,, greater in R hand RUE Sensation: decreased light touch, decreased proprioception RUE Coordination: decreased fine motor, decreased gross motor  Lower Extremity Assessment: Defer to PT evaluation RLE Deficits / Details: hip flex 4/5, quads 5/5. major complaint is numbness RLE Sensation: decreased light touch    ADLs  Overall ADL's : Needs assistance/impaired Eating/Feeding: Set up, Supervision/ safety, Sitting Grooming: Wash/dry hands, Wash/dry face, Oral care, Supervision/safety, Set up, Bed level Grooming Details (indicate cue type and reason): difficulty with manipulating objects Upper Body Bathing: Minimal assistance Lower Body Bathing: Maximal assistance, Sit to/from stand Upper Body Dressing : Minimal assistance, Cueing for sequencing Upper Body Dressing Details (indicate cue type and reason): VC's required for problem solving through task as well as for hemidressing techniques. Lower Body Dressing: Maximal assistance, Sit to/from stand Toilet Transfer: Moderate assistance, +2 for physical assistance, Stand-pivot Toilet Transfer Details (indicate cue type and reason):  Attempted sit<>stand for ambulation x3 with max assist +2. Pt unsafe to attempt ambulation but was able to complete simulated stand-pivot toilet transfer with mod assist +2. Toileting- Clothing Manipulation and Hygiene: Moderate  assistance Functional mobility during ADLs: +2 for physical assistance General ADL Comments: VC's for hemidressing technique.  Continues to require verbal, visual, and tactile cues for sequencing of tasks.    Mobility  Overal bed mobility: Needs Assistance Bed Mobility: Supine to Sit Supine to sit: Supervision Sit to supine: Supervision, Min assist General bed mobility comments: No physical assistance required to transition to EOB. He did demonstrate heavy use of bed rails and required increased time.    Transfers  Overall transfer level: Needs assistance Equipment used: Rolling walker (2 wheeled), 2 person hand held assist Transfers: Sit to/from Stand, Stand Pivot Transfers Sit to Stand: Max assist, +2 physical assistance, From elevated surface Stand pivot transfers: Mod assist, +2 physical assistance, From elevated surface General transfer comment: Pt stood x2 with RW for support and +2 max assist for balance support. Significant anterior lean noted with cues required for upright posture. SPT was completed with +2 mod assist and no walker. Pt was able to take pivotal steps around to the chair.     Ambulation / Gait / Stairs / Wheelchair Mobility  Ambulation/Gait Ambulation/Gait assistance: Mod assist Ambulation Distance (Feet): 30 Feet Assistive device: Rolling walker (2 wheeled) Gait Pattern/deviations: Decreased step length - right, Narrow base of support, Ataxic General Gait Details: Pt was unsafe to attempt at this time.     Posture / Balance Dynamic Sitting Balance Sitting balance - Comments: Able to statically sit briefly without UE support.  Balance Overall balance assessment: Needs assistance Sitting-balance support: Bilateral upper extremity supported Sitting balance-Leahy Scale: Fair Sitting balance - Comments: Able to statically sit briefly without UE support.  Standing balance support: Bilateral upper extremity supported, During functional activity Standing  balance-Leahy Scale: Zero Standing balance comment: +2 required    Special needs/care consideration BiPAP/CPAP   no CPM   no Continuous Drip IV   no Dialysis   no        Life Vest   no Oxygen   no Special Bed   no Trach Size   n/a Wound Vac (area)   no       Skin   WDL per nursing assessment                               Bowel mgmt:   Last BM PTA per pt.  Bladder mgmt:   continent Diabetic mgmt  Home management with metformin and insulin     Previous Home Environment Living Arrangements: Alone Available Help at Discharge: Family, Available PRN/intermittently Type of Home: Apartment Home Layout: One level Home Access: Stairs to enter Entrance Stairs-Rails: Right, Left Entrance Stairs-Number of Steps: 18 steps "have to go up then down to get into my apartment" Can enter through sliding door without any steps, but someone has to be with him to go into apartment and unlock slilding patio door. Bathroom Shower/Tub: Public librarian, Architectural technologist: Handicapped height Bathroom Accessibility: Yes How Accessible: Accessible via walker [place]: No  Discharge Living Setting Plans for Discharge Living Setting: Patient's home Type of Home at Discharge: Apartment Discharge Home Layout: One level Discharge Home Access: Stairs to enter Entrance Stairs-Rails: Right, Left Entrance Stairs-Number of Steps:  (flight up then down to enter apartment unless he enters  thru sliding glass door (family would have to enter thru normal route to open sliding glass door for pt.) Discharge Bathroom Shower/Tub: Tub/shower unit Discharge Bathroom Toilet: Handicapped height Discharge Bathroom Accessibility: Yes How Accessible: Accessible via walker Does the patient have any problems obtaining your medications?: No  Social/Family/Support Systems Patient Roles: Parent (has 2 daughters Velna Hatchet and [place]) Anticipated Caregiver: Darcel Smalling, daughter and Myrlene Broker,  daughter Anticipated Caregiver's Contact Information: [name] [phone]; Velna Hatchet [phone] Ability/Limitations of Caregiver: [name] works during the day in her home as an [place] and Velna Hatchet works 3rd shift .   Caregiver Availability: Intermittent Discharge Plan Discussed with Primary Caregiver: Yes Is Caregiver In Agreement with Plan?: Yes Does Caregiver/Family have Issues with Lodging/Transportation while Pt is in Rehab?: No   Goals/Additional Needs Patient/Family Goal for Rehab: supervision and minimal assistance PT/OT/SLP Expected length of stay: 14-17 days Cultural Considerations: n/a Dietary Needs: carb modified diet, thin liquids Equipment Needs: TBA Additional Information: There seems to be a distance between pt's 2 daughters and they did not appear to communicate when in the same room with their dad.  [name] seems to take the lead role in their dad's affairs.  We discussed at length  that pt. is expected to need  min assist/supervision upon DC from rehab.  [name] seems to have a concern that the family cannot provide 24/7 and both daughters seem to think pt. may need SNF following CIR.  They have requested pt. to start at CIR to see how he progresses.  They have also been informed that if pt. progresses to a supervision level, he would likely not qualify for SNF. Pt/Family Agrees to Admission and willing to participate: Yes Program Orientation Provided & Reviewed with Pt/Caregiver Including Roles  & Responsibilities: Yes   Decrease burden of Care through IP rehab admission: n/a   Possible need for SNF placement upon discharge:  SNF is a possibility depending on pt's progress while on CIR   Patient Condition: This patient's medical and functional status has changed since the consult dated: 04/01/16 in which the Rehabilitation Physician determined and documented that the patient's condition is appropriate for intensive rehabilitative care in an inpatient rehabilitation  facility. See "History of Present Illness" (above) for medical update. Functional changes are: pt. at a +2 mod and max assist for transfers; max assist of 2 for balance support in standing, cueing for problem solving for upper body dressing at min assist level; max assist lower body dressing  . Patient's medical and functional status update has been discussed with the Rehabilitation physician and patient remains appropriate for inpatient rehabilitation. Will admit to inpatient rehab today.  Preadmission Screen Completed By:  [name], 04/03/2016 1:44 PM ______________________________________________________________________   Discussed status with Dr. Naaman Plummer on 04/03/16 at  1343  and received telephone approval for admission today.  Admission Coordinator:  [name], time 5916 /Date 04/03/16       Cosigned by: Meredith Staggers, MD at 04/03/2016 1:54 PM  Revision History

## 2016-04-03 NOTE — Progress Notes (Signed)
Patient arrived from [facility], assigned to room 4M03.

## 2016-04-04 ENCOUNTER — Inpatient Hospital Stay (HOSPITAL_COMMUNITY): Payer: Medicare Other | Admitting: Physical Therapy

## 2016-04-04 ENCOUNTER — Inpatient Hospital Stay (HOSPITAL_COMMUNITY): Payer: Medicare Other | Admitting: Occupational Therapy

## 2016-04-04 ENCOUNTER — Inpatient Hospital Stay (HOSPITAL_COMMUNITY): Payer: Medicare Other | Admitting: Speech Pathology

## 2016-04-04 DIAGNOSIS — D649 Anemia, unspecified: Secondary | ICD-10-CM

## 2016-04-04 DIAGNOSIS — E876 Hypokalemia: Secondary | ICD-10-CM

## 2016-04-04 DIAGNOSIS — D62 Acute posthemorrhagic anemia: Secondary | ICD-10-CM

## 2016-04-04 DIAGNOSIS — I5043 Acute on chronic combined systolic (congestive) and diastolic (congestive) heart failure: Secondary | ICD-10-CM

## 2016-04-04 DIAGNOSIS — E1165 Type 2 diabetes mellitus with hyperglycemia: Secondary | ICD-10-CM

## 2016-04-04 DIAGNOSIS — Z86718 Personal history of other venous thrombosis and embolism: Secondary | ICD-10-CM

## 2016-04-04 DIAGNOSIS — N182 Chronic kidney disease, stage 2 (mild): Secondary | ICD-10-CM

## 2016-04-04 DIAGNOSIS — E1159 Type 2 diabetes mellitus with other circulatory complications: Secondary | ICD-10-CM

## 2016-04-04 DIAGNOSIS — I2510 Atherosclerotic heart disease of native coronary artery without angina pectoris: Secondary | ICD-10-CM

## 2016-04-04 DIAGNOSIS — Z794 Long term (current) use of insulin: Secondary | ICD-10-CM

## 2016-04-04 LAB — COMPREHENSIVE METABOLIC PANEL
ALT: 12 U/L — AB (ref 17–63)
AST: 17 U/L (ref 15–41)
Albumin: 2.9 g/dL — ABNORMAL LOW (ref 3.5–5.0)
Alkaline Phosphatase: 43 U/L (ref 38–126)
Anion gap: 13 (ref 5–15)
BILIRUBIN TOTAL: 0.5 mg/dL (ref 0.3–1.2)
BUN: 20 mg/dL (ref 6–20)
CO2: 26 mmol/L (ref 22–32)
CREATININE: 1.21 mg/dL (ref 0.61–1.24)
Calcium: 9.4 mg/dL (ref 8.9–10.3)
Chloride: 103 mmol/L (ref 101–111)
GFR calc Af Amer: >60 mL/min (ref >60)
Glucose, Bld: 87 mg/dL (ref 65–99)
POTASSIUM: 3.5 mmol/L (ref 3.5–5.1)
Sodium: 142 mmol/L (ref 135–145)
TOTAL PROTEIN: 6.9 g/dL (ref 6.5–8.1)

## 2016-04-04 LAB — CBC WITH DIFFERENTIAL/PLATELET
BASOS ABS: 0 10*3/uL (ref 0.0–0.1)
Basophils Relative: 1 %
Eosinophils Absolute: 0.2 10*3/uL (ref 0.0–0.7)
Eosinophils Relative: 3 %
HEMATOCRIT: 39.2 % (ref 39.0–52.0)
Hemoglobin: 12.6 g/dL — ABNORMAL LOW (ref 13.0–17.0)
LYMPHS ABS: 2.3 10*3/uL (ref 0.7–4.0)
LYMPHS PCT: 35 %
MCH: 28.8 pg (ref 26.0–34.0)
MCHC: 32.1 g/dL (ref 30.0–36.0)
MCV: 89.5 fL (ref 78.0–100.0)
MONO ABS: 0.6 10*3/uL (ref 0.1–1.0)
MONOS PCT: 9 %
NEUTROS ABS: 3.5 10*3/uL (ref 1.7–7.7)
Neutrophils Relative %: 52 %
Platelets: 247 10*3/uL (ref 150–400)
RBC: 4.38 MIL/uL (ref 4.22–5.81)
RDW: 13.1 % (ref 11.5–15.5)
WBC: 6.6 10*3/uL (ref 4.0–10.5)

## 2016-04-04 LAB — GLUCOSE, CAPILLARY
GLUCOSE-CAPILLARY: 85 mg/dL (ref 65–99)
GLUCOSE-CAPILLARY: 177 mg/dL — AB (ref 65–99)
GLUCOSE-CAPILLARY: 176 mg/dL — AB (ref 65–99)
Glucose-Capillary: 103 mg/dL — ABNORMAL HIGH (ref 65–99)
Glucose-Capillary: 89 mg/dL (ref 65–99)

## 2016-04-04 MED ORDER — INSULIN ASPART PROT & ASPART (70-30 MIX) 100 UNIT/ML ~~LOC~~ SUSP
30.0000 [IU] | Freq: Every day | SUBCUTANEOUS | Status: DC
  Administered 2016-04-04 – 2016-04-24 (×21): 30 [IU] via SUBCUTANEOUS

## 2016-04-04 MED ORDER — INSULIN ASPART PROT & ASPART (70-30 MIX) 100 UNIT/ML ~~LOC~~ SUSP
55.0000 [IU] | Freq: Every day | SUBCUTANEOUS | Status: DC
  Administered 2016-04-04 – 2016-04-08 (×4): 55 [IU] via SUBCUTANEOUS

## 2016-04-04 MED ORDER — INSULIN ASPART PROT & ASPART (70-30 MIX) 100 UNIT/ML ~~LOC~~ SUSP: 55.0000 [IU] | Freq: Every day | SUBCUTANEOUS | Status: DC

## 2016-04-04 MED ORDER — POTASSIUM CHLORIDE CRYS ER 10 MEQ PO TBCR
10.0000 meq | EXTENDED_RELEASE_TABLET | Freq: Two times a day (BID) | ORAL | Status: DC
  Administered 2016-04-04 – 2016-04-06 (×4): 10 meq via ORAL
  Filled 2016-04-04 (×4): qty 1

## 2016-04-04 NOTE — Progress Notes (Signed)
Per nursing BS was 85 therefore insulin held at breakfast. He did eat 80% of meal.  Will decrease am 70/50 to 55 --close to home dose and decrease pm dose to 30 units. Monitor BS as activity levels improve.

## 2016-04-04 NOTE — Evaluation (Signed)
Speech Language Pathology Assessment and Plan  Patient Details  Name: [name] MRN: [id number] Date of Birth: [birth year]  SLP Diagnosis: Cognitive Impairments  Rehab Potential: Good ELOS: 18 to 21 days    Today's Date: 04/04/2016 SLP Individual Time: 1500-1540 SLP Individual Time Calculation (min): 40 min   Problem List:  Patient Active Problem List   Diagnosis Date Noted  . History of DVT (deep vein thrombosis)   . Coronary artery disease involving native coronary artery of native heart without angina pectoris   . Morbid obesity ([place])   . Stage 2 chronic kidney disease   . Hypokalemia   . Acute blood loss anemia   . Embolic stroke involving posterior cerebral artery ([place]) 04/03/2016  . Brainstem stroke ([place]) 04/01/2016  . Abnormality of gait and mobility   . Cerebral infarction due to thrombosis of other cerebral artery ([place])   . Right arm numbness   . Left sided numbness 03/29/2016  . TIA (transient ischemic attack) 03/29/2016  . Hypertensive urgency 03/29/2016  . Sepsis ([place]) 09/10/2013  . DVT (deep venous thrombosis) ([place]) 01/01/2013  . Rash and nonspecific skin eruption 12/29/2012  . Cellulitis and abscess of leg 12/29/2012  . Diabetes type 2, uncontrolled ([place]) 12/29/2012  . Hypertension   . Cardiomyopathy ([place])   . CHF (congestive heart failure) ([place])   . Peripheral neuropathy ([place])   . Special screening for malignant neoplasms, colon 12/09/2012   Past Medical History:  Past Medical History:  Diagnosis Date  . Adrenal insufficiency ([place])   . Allergy   . Arthritis   . Blind left eye   . Carpal tunnel syndrome   . Cataract    bil removed  . CHF (congestive heart failure) ([place])   . Diabetes mellitus without complication ([place])   . ED (erectile dysfunction)   . Gout   . Herpes   . Hypercholesterolemia   . Hypertension   . MI (myocardial infarction)   . Peripheral neuropathy ([place])   . Pneumonia   . PVD (peripheral vascular disease) ([place])    Past  Surgical History:  Past Surgical History:  Procedure Laterality Date  . CHOLECYSTECTOMY    . dilatera cataracts removed    . right knee surgary    . stents      Assessment / Plan / Recommendation Clinical Impression NYZIR DUBOIS is a 61 year old male with history of CAD, with ICM, diastolic CHF, N2DP, peripheral neuropathy, DVT- on Xarelto who was admitted on 03/28/16 with right sided numbness. He reported running out of Xarelto for 3 days PTA. BP elevated in ED and patient with ST changes in inferolateral leads. UDS negative. 2 D echo done revealing mild LVH with global hypokinesis with akinesis of inferolateral wall and EF 25- 30% with grade 1. Dr. Warren Lacy recommended outpatient perfusion study and Bidil and coreg added for management of cardiomyopathy.MRI brain done revealing old bilateral thalamic infarcts, bilateral basal ganglia lacunar infarcts, multiple old pontine and cerebellar infarcts.MRA head/neck done revealing severe stenosis B-ICA with multifocal moderate stenosis posterior circulation and occluded R-VA without significant reconstitution. MRI cervical spine showed cervical spondylosis with discogenic and facet degenerative changes with right central disc protrusion C3-C4. Neurology consulted for input on severe bilateral peripheral neuropathy with absent sensation to knees.Repeat MRI brain done revealing 9 mm acute ischemic infarct within left paramedian pons.Therapy evaluations done revealing right sided weakness with vision deficits, unsteady gait with inability to advance RLE and ataxic gait. CIR recommended for follow up therapy.  Pt admited to CIR on 04/03/16 and cognitive linguistic evaluations completed. Pt demonstrates moderate cognitive deficits as revealed by score of 13 out of 22 on MOCA Blind (n/=18). Deficits noted in the areas of short term memory (recall of new infomraiton specificially), initiation, delayed processing, basic problem solving and imtelligibility at  the simple conversation level. Pt's intelligilibilty doesn't appear related to flaccidity but rather cognitive awareness of decreased communication at the conversation level. Pt requires skilled ST to address these deficits to increase funcitonal independence prior to discharge. Anticipate pt will need 24 hour supervision at time of discharge, daughter states that they plan on pt discharging to skilled nursing facility.     Skilled Therapeutic Interventions          Skilled treatment focused on completion of cognitive linguistic evaluation, see above. Results and plan of care established with pt and daughter. Pt required Max A verbal cues for sustained attention.    SLP Assessment  Patient will need skilled [facility] during CIR admission    Recommendations  Patient destination: [facility] (SNF) Follow up Recommendations: Skilled Nursing facility Equipment Recommended: None recommended by SLP    SLP Frequency 3 to 5 out of 7 days   SLP Duration  SLP Intensity  SLP Treatment/Interventions 18 to 21 days  Minumum of 1-2 x/day, 30 to 90 minutes  Cognitive remediation/compensation;Cueing hierarchy;Environmental controls;Functional tasks;Patient/family education;Medication managment    Pain Pain Assessment Pain Assessment: 0-10 Pain Score: 3  Pain Location: Head Pain Intervention(s): Medication (See eMAR)  Prior Functioning Type of Home: Apartment  Lives With: Alone Available Help at Discharge: Family;Available PRN/intermittently Vocation: Retired  Function:   Cognition Comprehension Comprehension assist level: Understands basic 50 - 74% of the time/ requires cueing 25 - 49% of the time  Expression   Expression assist level: Expresses basic 50 - 74% of the time/requires cueing 25 - 49% of the time. Needs to repeat parts of sentences.  Social Interaction Social Interaction assist level: Interacts appropriately 50 - 74% of the time - May be  physically or verbally inappropriate.  Problem Solving Problem solving assist level: Solves basic 25 - 49% of the time - needs direction more than half the time to initiate, plan or complete simple activities  Memory Memory assist level: Recognizes or recalls 50 - 74% of the time/requires cueing 25 - 49% of the time   Short Term Goals: Week 1: SLP Short Term Goal 1 (Week 1): Pt will demonstrate sustained attention to basic, familiar tasks for 10 minutes with Mod A cues for redirection.  SLP Short Term Goal 2 (Week 1): Pt will perform medicaiton management tasks with Mod A verbal cues.  SLP Short Term Goal 3 (Week 1): Pt will utilize external memory aids ot recall new, daily information with Mod A verbal cues.  SLP Short Term Goal 4 (Week 1): Pt will demonstrate emergent awareness by identifying 2 cognitive and 2 physical deficits related to CVA wtih Mod A verbal cues.  SLP Short Term Goal 5 (Week 1): Pt will utilize speech intelligibility strategies with Mod A verbal cues to achieve ~>75% intelligibility at the simple conversation level.   Refer to Care Plan for Long Term Goals  Recommendations for other services: None   Discharge Criteria: Patient will be discharged from SLP if patient refuses treatment 3 consecutive times without medical reason, if treatment goals not met, if there is a change in medical status, if patient makes no progress towards goals or if patient  is discharged from hospital.  The above assessment, treatment plan, treatment alternatives and goals were discussed and mutually agreed upon: by patient and by family  Arrie Zuercher 04/04/2016, 3:54 PM

## 2016-04-04 NOTE — Plan of Care (Signed)
Problem: RH PAIN MANAGEMENT Goal: RH STG PAIN MANAGED AT OR BELOW PT'S PAIN GOAL </=4 on pain scale of 0-10  Outcome: Progressing tylenol

## 2016-04-04 NOTE — Care Management Note (Signed)
Inpatient [facility] Statement of Services  Patient Name:  [name]  Date:  04/04/2016  Welcome to the [facility].  Our goal is to provide you with an individualized program based on your diagnosis and situation, designed to meet your specific needs.  With this comprehensive rehabilitation program, you will be expected to participate in at least 3 hours of rehabilitation therapies Monday-Friday, with modified therapy programming on the weekends.  Your rehabilitation program will include the following services:  Physical Therapy (PT), Occupational Therapy (OT), Speech Therapy (ST), 24 hour per day rehabilitation nursing, Therapeutic Recreaction (TR), Neuropsychology, Case Management (Social Worker), Rehabilitation Medicine, Nutrition Services and Pharmacy Services  Weekly team conferences will be held on Wednesday to discuss your progress.  Your Social Worker will talk with you frequently to get your input and to update you on team discussions.  Team conferences with you and your family in attendance may also be held.  Expected length of stay: 18-21 days  Overall anticipated outcome: min assist level  Depending on your progress and recovery, your program may change. Your Social Worker will coordinate services and will keep you informed of any changes. Your Social Worker's name and contact numbers are listed  below.  The following services may also be recommended but are not provided by the [facility]:    [facility] will be made to provide these services after discharge if needed.  Arrangements include referral to agencies that provide these services.  Your insurance has been verified to be:  Medicare Your primary doctor is:  Maury Dus  Pertinent information will be shared with your doctor and your insurance company.  Social Worker:  Ovidio Kin, [place] or ([phone]  Information discussed with and copy given to patient by: Elease Hashimoto, 04/04/2016, 3:00 PM

## 2016-04-04 NOTE — IPOC Note (Signed)
Overall Plan of Care [facility]) Patient Details Name: [name] MRN: [id number] DOB: 1955/07/25  Admitting Diagnosis: CVA  Hospital Problems: Active Problems:   Diabetes type 2, uncontrolled ([place])   Hypertension   CHF (congestive heart failure) ([place])   Brainstem stroke ([place])   Embolic stroke involving posterior cerebral artery ([place])   History of DVT (deep vein thrombosis)   Coronary artery disease involving native coronary artery of native heart without angina pectoris   Morbid obesity ([place])   Stage 2 chronic kidney disease   Hypokalemia   Acute blood loss anemia     Functional Problem List: Nursing Bladder, Bowel, Edema, Endurance, Medication Management, Pain, Safety, Skin Integrity, Motor  PT Balance, Endurance, Motor, Pain, Nutrition, Perception, Sensory, Skin Integrity  OT Balance, Behavior, Cognition, Endurance, Motor, Perception, Safety, Sensory, Vision  SLP Cognition, Linguistic  TR         Basic ADL's: OT Eating, Grooming, Dressing, Bathing, Toileting     Advanced  ADL's: OT       Transfers: PT Bed Mobility, Bed to Chair, Car, Manufacturing systems engineer, Metallurgist: PT Ambulation, Emergency planning/management officer, Stairs     Additional Impairments: OT Fuctional Use of Upper Extremity  SLP Social Cognition      TR      Anticipated Outcomes Item Anticipated Outcome  Self Feeding Supervision  Swallowing      Basic self-care  Min A  Toileting  Min A   Bathroom Transfers Min A  Bowel/Bladder  continent of bowel and bladdeer with min assist  Transfers  Min a overall  Locomotion  Min A  Communication  Supervision  Cognition  Min A  Pain  pain less than or equal to 4/10 with min assist  Safety/Judgment  patient free from falls/injury and making sound safety decisions with min assist   Therapy Plan: PT Intensity: Minimum of 1-2 x/day ,45 to 90 minutes PT Frequency: 5 out of 7 days PT Duration Estimated Length of Stay: 15-20 days OT Intensity:  Minimum of 1-2 x/day, 45 to 90 minutes OT Frequency: 5 out of 7 days OT Duration/Estimated Length of Stay: 18-21 days SLP Intensity: Minumum of 1-2 x/day, 30 to 90 minutes SLP Frequency: 3 to 5 out of 7 days SLP Duration/Estimated Length of Stay: 18 to 21 days       Team Interventions: Nursing Interventions Patient/Family Education, Bladder Management, Bowel Management, Pain Management, Medication Management, Skin Care/Wound Management, Cognitive Remediation/Compensation, Discharge Planning  PT interventions Ambulation/gait training, Balance/vestibular training, Community reintegration, Cognitive remediation/compensation, Discharge planning, Disease management/prevention, DME/adaptive equipment instruction, Functional mobility training, Neuromuscular re-education, Patient/family education, Pain management, Skin care/wound management, Therapeutic Activities, Therapeutic Exercise, UE/LE Coordination activities, UE/LE Strength taining/ROM, Visual/perceptual remediation/compensation, Wheelchair propulsion/positioning, Stair training  OT Interventions Training and development officer, Cognitive remediation/compensation, Academic librarian, Discharge planning, DME/adaptive equipment instruction, Functional mobility training, Neuromuscular re-education, Functional electrical stimulation, Pain management, Patient/family education, Self Care/advanced ADL retraining, Therapeutic Activities, Therapeutic Exercise, UE/LE Strength taining/ROM, UE/LE Coordination activities, Visual/perceptual remediation/compensation, Wheelchair propulsion/positioning  SLP Interventions Cognitive remediation/compensation, English as a second language teacher, Environmental controls, Functional tasks, Patient/family education, Medication managment  TR Interventions    SW/CM Interventions Discharge Planning, Psychosocial Support, Patient/Family Education    Team Discharge Planning: Destination: PT-Skilled Nursing Facility (SNF) ,[facility] (SNF) , [place] (SNF) Projected Follow-up: PT-Skilled nursing facility, OT-  Skilled nursing facility, SLP-Skilled Nursing facility Projected Equipment Needs: PT-None recommended by PT, OT- Other (comment) (likely none 2/2 dc to SNF), SLP-None recommended by SLP  Equipment Details: PT- , OT-  Patient/family involved in discharge planning: PT- Patient,  OT-Patient, SLP-Patient, Family member/caregiver  MD ELOS: 18-22 days. Medical Rehab Prognosis:  Good Assessment: 61 year old male with history of CAD, with ICM, diastolic CHF, [id number], peripheral neuropathy, DVT- on Xarelto who was admitted on 03/28/16 with right sided numbness. He reported running out of Xarelto for 3 days PTA. BP elevated in ED and patient with ST changes in inferolateral leads. UDS negative. 2 D echo done revealing mild LVH with global hypokinesis with akinesis of inferolateral wall and EF 25- 30% with grade 1. Dr. Warren Lacy recommended outpatient perfusion study and Bidil and coreg added for management of cardiomyopathy.MRI brain done revealing old bilateral thalamic infarcts, bilateral basal ganglia lacunar infarcts, multiple old pontine and cerebellar infarcts.MRA head/neck done revealing severe stenosis B-ICA with multifocal moderate stenosis posterior circulation and occluded R-VA without significant reconstitution. MRI cervical spine showed cervical spondylosis with discogenic and facet degenerative changes with right central disc protrusion C3-C4. Neurology consulted for input on severe bilateral peripheral neuropathy with absent sensation to knees.Repeat MRI brain done revealing 9 mm acute ischemic infarct within left paramedian pons.Pt with resulting functional deficits with mobility, endurance, cognition.  Will set goals for Min A with PT/OT and supervision with SLP.    See Team Conference Notes for weekly updates to the plan of care

## 2016-04-04 NOTE — Progress Notes (Signed)
Social Work Assessment and Plan Social Work Assessment and Plan  Patient Details  Name: [name] MRN: [id number] Date of Birth: [birth year]  Today's Date: 04/04/2016  Problem List:  Patient Active Problem List   Diagnosis Date Noted  . History of DVT (deep vein thrombosis)   . Coronary artery disease involving native coronary artery of native heart without angina pectoris   . Morbid obesity ([place])   . Stage 2 chronic kidney disease   . Hypokalemia   . Acute blood loss anemia   . Embolic stroke involving posterior cerebral artery ([place]) 04/03/2016  . Brainstem stroke ([place]) 04/01/2016  . Abnormality of gait and mobility   . Cerebral infarction due to thrombosis of other cerebral artery ([place])   . Right arm numbness   . Left sided numbness 03/29/2016  . TIA (transient ischemic attack) 03/29/2016  . Hypertensive urgency 03/29/2016  . Sepsis ([place]) 09/10/2013  . DVT (deep venous thrombosis) ([place]) 01/01/2013  . Rash and nonspecific skin eruption 12/29/2012  . Cellulitis and abscess of leg 12/29/2012  . Diabetes type 2, uncontrolled ([place]) 12/29/2012  . Hypertension   . Cardiomyopathy ([place])   . CHF (congestive heart failure) ([place])   . Peripheral neuropathy ([place])   . Special screening for malignant neoplasms, colon 12/09/2012   Past Medical History:  Past Medical History:  Diagnosis Date  . Adrenal insufficiency ([place])   . Allergy   . Arthritis   . Blind left eye   . Carpal tunnel syndrome   . Cataract    bil removed  . CHF (congestive heart failure) ([place])   . Diabetes mellitus without complication ([place])   . ED (erectile dysfunction)   . Gout   . Herpes   . Hypercholesterolemia   . Hypertension   . MI (myocardial infarction)   . Peripheral neuropathy ([place])   . Pneumonia   . PVD (peripheral vascular disease) ([place])    Past Surgical History:  Past Surgical History:  Procedure Laterality Date  . CHOLECYSTECTOMY    . dilatera cataracts removed    . right knee surgary     . stents     Social History:  reports that he has never smoked. He has never used smokeless tobacco. He reports that he does not drink alcohol or use drugs.  Family / Support Systems Marital Status: Separated Patient Roles: Parent Children: [name] [id number] Other Supports: [name]-daughter [phone]-cell Anticipated Caregiver: Has no caregiver Ability/Limitations of Caregiver: [name] works during the day and James Hatchet works 3rd shift, no one is available to provide 24 hr care Caregiver Availability: Other (Comment) (Only can check on pt, otherwise will be NHP) Family Dynamics: Daughter's try to assist pt when they can. James Hatchet reports she takes him to his appointments and to church. [name] does not live here, she was here for a few days when Dad had his stroke, but has to get back. Pt has a few friends but they have health issues alos.  Social History Preferred language: English Religion: Methodist Cultural Background: No issues Education: [employer] Read: Yes Write: Yes Employment Status: Disabled Freight forwarder Issues: No issues Guardian/Conservator: none-according to MD pt is not capable at this time to make any decisions while here. Will look toward duahger's who don't seem to get along for any decisions needed to be made while here.   Abuse/Neglect Physical Abuse: Denies Verbal Abuse: Denies Sexual Abuse: Denies Exploitation of patient/patient's resources: Denies Self-Neglect: Denies Possible abuse reported to:: Other (Comment)  Emotional Status Pt's affect, behavior adn adjustment status: Pt is severely delayed in his responses due to the stroke he has had. James Hatchet reports he has always been independent and taken care of himself and done what he wanted. She tried to help with transportation but this worker wonders if she was helping him. Recent Psychosocial Issues: other health issues-diabetic neuropathy and blindness in his  left eye Pyschiatric History: No history deferred due to severity of his stroke. His responses are delayed and not able to obtain the information for my assessment. Will continue to monitor and ask team how doing in therapies. May benefit from seeing neuro-psyc while here. Substance Abuse History: No issues  Patient / Family Perceptions, Expectations & Goals Pt/Family understanding of illness & functional limitations: Pt is aware he is on rehab and wants to do well here, usure if realizes the reason he is here. James Hatchet reports she and her sister have talked with the MD and [facility] regarding his stroke and deficits. Both are hopeful he will do well here and make progress, but do feel he will need care after leaving here. Premorbid pt/family roles/activities: Father, retiree, church member, etc Anticipated changes in roles/activities/participation: resume Pt/family expectations/goals: Pt states: " I will try, but tired."  Brandi states: " If we could we would take care of him but we work."  [employer]: None Premorbid Home Care/DME Agencies: None Transportation available at discharge: family and took the taxi for his groceries Resource referrals recommended: Neuropsychology, Support group (specify)  Discharge Planning Living Arrangements: Alone Support Systems: Children, Friends/neighbors Type of Residence: Private residence Administrator, sports: Chartered certified accountant Resources: [employer] Screen Referred: Yes Living Expenses: Education officer, community Management: Patient Does the patient have any problems obtaining your medications?: No Home Management: Patient Patient/Family Preliminary Plans: Pt wants to return home from here, daughter's feel he will need to go to a NH from here to get more rehab and care. Have encouraged daughter to apply for Medicaid while he is here so he will have a supplemental insurance for NHP> Social Work Anticipated Follow Up Needs: SNF, Support  Group  Clinical Impression Pleasant quiet severely delayed gentleman who is tired from his first day of therapies here. James Hatchet daughter is here and reports she will apply for Medicaid for him to prepare for when he goes to a NH.  Both daughter's are supportive but limited due to work obligations. Pt would need to be mod/i to be able to return home, if requires assistance will need to pursue NHP. Will await teams' evaluations and work  On the most appropriate discharge plan.  Elease Hashimoto 04/04/2016, 3:25 PM

## 2016-04-04 NOTE — Progress Notes (Signed)
[facility] PH[facility]ICAL [facility]ICINE & REHABILITATION     PROGRESS NOTE  Subjective/Complaints:  Pt seen sitting up in bed this AM.  He states he slept well overnight.  His affect is flat.    ROS: Denies CP, SOB, N/V/D.  Objective: Vital Signs: Blood pressure (!) 150/58, pulse 68, temperature 98.2 F (36.8 C), temperature source Oral, resp. rate 19, height 5\' 9"  (1.753 m), weight 117.3 kg (258 lb 9.6 oz), SpO2 98 %. No results found.  Recent Labs  04/04/16 0516  WBC 6.6  HGB 12.6*  HCT 39.2  PLT 247    Recent Labs  04/03/16 0315 04/04/16 0516  NA 141 142  K 3.3* 3.5  CL 103 103  GLUCOSE 149* 87  BUN 23* 20  CREATININE 1.43* 1.21  CALCIUM 9.2 9.4   CBG (last 3)   Recent Labs  04/03/16 1702 04/03/16 2030 04/04/16 0635  GLUCAP 200* 139* 85    Wt Readings from Last 3 Encounters:  04/03/16 117.3 kg (258 lb 9.6 oz)  03/28/16 111.1 kg (245 lb)  09/04/13 120.2 kg (265 lb)    Physical Exam:  BP (!) 150/58 (BP Location: Right Arm)   Pulse 68   Temp 98.2 F (36.8 C) (Oral)   Resp 19   Ht 5\' 9"  (1.753 m)   Wt 117.3 kg (258 lb 9.6 oz)   SpO2 98%   BMI 38.19 kg/m  Constitutional: He appears well-developed. Obese. HENT: Normocephalic and atraumatic.  Eyes: EOM are normal. No discharge.  Cardiovascular: Normal rate and regular rhythm.  No JVD. Respiratory: Effort normal and breath sounds normal.  GI: Soft. Bowel sounds are normal.  Musculoskeletal: He exhibits edema >RLE.Marland Kitchen He exhibits no tenderness.  Neurological: He is alert and oriented.  Dysarthria.  Limited vision.  Tends to keep eyes closed.  Poor insight and awareness.    Sensory deficits RLE> LLE.   Motor: 4+/5 RUE prox to distal. Ataxia.  RLE: 4/+/5 proximal to distal  LLE 4+ /5 proximal to distal.  LUE/LLE proximal to distal 5/5.  Skin: Skin is warm and dry.  Psychiatric: Flat affect. Slowed behavior  Assessment/Plan: 1. Functional deficits secondary to left paramedian pontine infarct which require  3+ hours per day of interdisciplinary therapy in a comprehensive inpatient rehab setting. Physiatrist is providing close team supervision and 24 hour management of active medical problems listed below. Physiatrist and rehab team continue to assess barriers to discharge/monitor patient progress toward functional and medical goals.  Function:  Bathing Bathing position      Bathing parts      Bathing assist        Upper Body Dressing/Undressing Upper body dressing                    Upper body assist        Lower Body Dressing/Undressing Lower body dressing                                  Lower body assist        Toileting Toileting          Toileting assist     Transfers Chair/bed transfer             Locomotion Ambulation           Wheelchair          Cognition Comprehension    Expression    Social Interaction  Problem Solving    Memory      Medical Problem List and Plan: 1.  Right hemiparesis and functional deficits secondary to left paramedian pontine infarct  Records reviewed, images reviewed, see above/below.   Begin CIR 2. H/o DVT /Anticoagulation: Pharmaceutical: Xarelto 3. Pain Management: N/A.  4. Mood: LCSW to follow for evaluation and support.  5. Neuropsych: This patient is not yet capable of making decisions on his own behalf. 6. Skin/Wound Care: Routine pressure relief measures.  7. Fluids/Electrolytes/Nutrition: Monitor I/Os 8. HTN: Monitor BP qid for now--avoid hypotension.   Continue chlorthalidone, coreg, cozaar, Bidil and lasix.     Monitor with increased mobility 9. T2DM with retinopathy and neuropathy: Blood sugars poorly controlled with A1C- 11.0. Consult RD to educated on [place]. Monitor BS ac/hs.   Continue Novolog 70/30 bid and titrate as needed for tighter control.  Monitor with increased mobility 10. CAD/ Acute systolic CHF/NICM: compensated. Check daily weights and monitor for signs of  overload. Heart healthy/low salt diet. Will need perfusion study on outpatient basis per cardiology. Cont meds Filed Weights   04/03/16 1616  Weight: 117.3 kg (258 lb 9.6 oz)  11. Morbid Obesity: BMI - 36. Encourage appropriate diet, weight loss and exercise.  12. CKD: Anticipate some rise due to diuresis from of lasix, and hydralazine. Will monitor with serial check of lytes.   Cr. 1.21 on 2/7 (around baseline) 13. Hypokalemia: Added low dose daily supplement.  K+ 3.5 on 2/7  Cont to monitor 14. ABLA  Hb 12.6 on 2/7  Cont to monitor  LOS (Days) 1 A FACE TO FACE EVALUATION WAS PERFORMED  Rosmery Duggin Lorie Phenix 04/04/2016 8:22 AM

## 2016-04-04 NOTE — H&P (Signed)
Physical Medicine and Rehabilitation Admission H&P       Chief Complaint  Patient presents with  . Left sided weakness with ataxia and worsening of sensory deficits on the right     HPI: [name] is a 61 year old male with history of CAD, with ICM, diastolic CHF, R4WN, peripheral neuropathy, DVT- on Xarelto who was admitted on 03/28/16 with right sided numbness. He reported running out of Xarelto for 3 days PTA.  BP elevated in ED and patient with ST changes in inferolateral leads. UDS negative.  2 D echo done revealing mild LVH with global hypokinesis with akinesis of inferolateral wall  and EF 25- 30% with grade 1. Dr. Warren Lacy recommended outpatient perfusion study and Bidil and coreg added for management of cardiomyopathy.  MRI brain done revealing old bilateral thalamic infarcts, bilateral basal ganglia lacunar infarcts, multiple old pontine and cerebellar infarcts. MRA head/neck done revealing severe stenosis B-ICA with multifocal moderate stenosis posterior circulation and occluded R-VA without significant reconstitution.  MRI cervical spine showed cervical spondylosis with discogenic and facet degenerative changes with right central disc protrusion C3-C4. Neurology consulted for input on severe bilateral peripheral neuropathy with absent sensation to knees. Repeat MRI brain done revealing 9 mm acute ischemic infarct within left paramedian pons. Therapy evaluations done revealing right sided weakness with vision deficits, unsteady gait with inability to advance RLE and ataxic gait. CIR recommended for follow up therapy.    Review of Systems  HENT: Negative for hearing loss and tinnitus.   Eyes:       Blind in left eye and poor vision in right eye  Respiratory: Positive for shortness of breath (with activity--getting worse per family). Negative for cough.   Cardiovascular: Positive for leg swelling. Negative for chest pain and palpitations.  Gastrointestinal: Negative for  constipation and heartburn.  Genitourinary: Negative for frequency and hematuria.  Musculoskeletal: Negative for joint pain and myalgias.  Skin: Negative for rash.  Neurological: Positive for sensory change (RLE/RUE numbness) and focal weakness. Negative for dizziness, tremors and headaches.  Psychiatric/Behavioral: Negative for memory loss. The patient is not nervous/anxious and does not have insomnia.         Past Medical History:  Diagnosis Date  . Adrenal insufficiency ([place])   . Allergy   . Arthritis   . Blind left eye   . Carpal tunnel syndrome   . Cataract    bil removed  . CHF (congestive heart failure) ([place])   . Diabetes mellitus without complication ([place])   . ED (erectile dysfunction)   . Gout   . Herpes   . Hypercholesterolemia   . Hypertension   . MI (myocardial infarction)   . Peripheral neuropathy ([place])   . Pneumonia   . PVD (peripheral vascular disease) ([place])          Past Surgical History:  Procedure Laterality Date  . CHOLECYSTECTOMY    . dilatera cataracts removed    . right knee surgary    . stents            Family History  Problem Relation Age of Onset  . Breast cancer Mother   . Hypertension Father     blood clot in leg  . Diabetes Maternal Aunt   . Heart attack Sister   . Colon cancer Neg Hx   . Esophageal cancer Neg Hx   . Rectal cancer Neg Hx   . Stomach cancer Neg Hx     Social History:  Lives alone. Independent PTA. Daughter lives across the street. He manages home and takes cab for grocery shopping. Family checks in occasionally. He reports that he has never smoked. He has never used smokeless tobacco. He reports that he does not drink alcohol or use drugs.         Allergies  Allergen Reactions  . Iohexol     hives  . Ivp Dye [Iodinated Diagnostic Agents]     hives          Medications Prior to Admission  Medication Sig Dispense Refill  . cloNIDine (CATAPRES) 0.2 MG  tablet Take 1 tablet (0.2 mg total) by mouth 3 (three) times daily. 90 tablet 0  . furosemide (LASIX) 80 MG tablet Take 80 mg by mouth daily.     . insulin NPH-regular (NOVOLIN 70/30) (70-30) 100 UNIT/ML injection Inject into the skin. 52 units in the morning and 32 units in the evening    . losartan-hydrochlorothiazide (HYZAAR) 100-12.5 MG per tablet Take 1 tablet by mouth daily.    . metFORMIN (GLUCOPHAGE) 1000 MG tablet Take 1,000 mg by mouth 2 (two) times daily with a meal.    . metoprolol tartrate (LOPRESSOR) 25 MG tablet Take 25 mg by mouth 2 (two) times daily.    . rivaroxaban (XARELTO) 20 MG TABS tablet Take 20 mg by mouth daily.    Marland Kitchen triamcinolone ointment (KENALOG) 0.1 % Apply 1 application topically 2 (two) times daily as needed (rash/irritation on legs).     . cephALEXin (KEFLEX) 500 MG capsule Take 1 capsule (500 mg total) by mouth 3 (three) times daily. (Patient not taking: Reported on 07/12/2014) 24 capsule 0  . hydrALAZINE (APRESOLINE) 25 MG tablet Take 1 tablet (25 mg total) by mouth 3 (three) times daily. (Patient not taking: Reported on 03/28/2016) 90 tablet 1  . hydrOXYzine (ATARAX/VISTARIL) 25 MG tablet Take 1 tablet (25 mg total) by mouth 3 (three) times daily as needed for itching (refractory to benadryl). (Patient not taking: Reported on 07/12/2014) 30 tablet 0  . ondansetron (ZOFRAN ODT) 4 MG disintegrating tablet Take 1 tablet (4 mg total) by mouth every 6 (six) hours as needed for nausea or vomiting. (Patient not taking: Reported on 03/28/2016) 12 tablet 0    Home: Home Living Family/patient expects to be discharged to:: Private residence Living Arrangements: Alone Available Help at Discharge: Family, Available PRN/intermittently Type of Home: Apartment Home Access: Stairs to enter [employer] of Steps: 18 steps "have to go up then down to get into my apartment" Can enter through sliding door without any steps, but someone has to be with him to  go into apartment and unlock slilding patio door. Entrance Stairs-Rails: Right, Left Home Layout: One level Bathroom Shower/Tub: Tub/shower unit, Architectural technologist: Handicapped height Bathroom Accessibility: Yes Home Equipment: None   Functional History: Prior Function Level of Independence: Independent Comments: doesn't drive, pt with hx of decreased vision L eye  Functional Status:  Mobility: Bed Mobility Overal bed mobility: Needs Assistance Bed Mobility: Supine to Sit Supine to sit: Supervision Sit to supine: Supervision, Min assist General bed mobility comments: No physical assistance required to transition to EOB. He did demonstrate heavy use of bed rails and required increased time. Transfers Overall transfer level: Needs assistance Equipment used: Rolling walker (2 wheeled), 2 person hand held assist Transfers: Sit to/from Stand, Stand Pivot Transfers Sit to Stand: Max assist, +2 physical assistance, From elevated surface Stand pivot transfers: Mod assist, +2 physical assistance, From elevated surface  General transfer comment: Pt stood x2 with RW for support and +2 max assist for balance support. Significant anterior lean noted with cues required for upright posture. SPT was completed with +2 mod assist and no walker. Pt was able to take pivotal steps around to the chair.  Ambulation/Gait Ambulation/Gait assistance: Mod assist Ambulation Distance (Feet): 30 Feet Assistive device: Rolling walker (2 wheeled) Gait Pattern/deviations: Decreased step length - right, Narrow base of support, Ataxic General Gait Details: Pt was unsafe to attempt at this time.   ADL: ADL Overall ADL's : Needs assistance/impaired Eating/Feeding: Set up, Supervision/ safety, Sitting Grooming: Wash/dry hands, Wash/dry face, Oral care, Supervision/safety, Set up, Bed level Grooming Details (indicate cue type and reason): difficulty with manipulating objects Upper Body Bathing: Minimal  assistance Lower Body Bathing: Maximal assistance, Sit to/from stand Upper Body Dressing : Minimal assistance, Cueing for sequencing Upper Body Dressing Details (indicate cue type and reason): VC's required for problem solving through task as well as for hemidressing techniques. Lower Body Dressing: Maximal assistance, Sit to/from stand Toilet Transfer: Moderate assistance, +2 for physical assistance, Stand-pivot Toilet Transfer Details (indicate cue type and reason): Attempted sit<>stand for ambulation x3 with max assist +2. Pt unsafe to attempt ambulation but was able to complete simulated stand-pivot toilet transfer with mod assist +2. Toileting- Clothing Manipulation and Hygiene: Moderate assistance Functional mobility during ADLs: +2 for physical assistance General ADL Comments: VC's for hemidressing technique.  Continues to require verbal, visual, and tactile cues for sequencing of tasks.  Cognition: Cognition Overall Cognitive Status: Impaired/Different from baseline Orientation Level: Oriented X4 Cognition Arousal/Alertness: Awake/alert Behavior During Therapy: WFL for tasks assessed/performed Overall Cognitive Status: Impaired/Different from baseline Area of Impairment: Following commands, Safety/judgement, Awareness, Problem solving Following Commands: Follows one step commands with increased time Safety/Judgement: Decreased awareness of safety Awareness: Emergent Problem Solving: Slow processing, Difficulty sequencing General Comments: Pt with good awareness of deficits and able to verbalize problems as they occur. Continues to require VC's for sequencing and increased time for processing.   Blood pressure 132/62, pulse 66, temperature 98.6 F (37 C), temperature source Oral, resp. rate 16, height '5\' 9"'$  (1.753 m), weight 111.1 kg (245 lb), SpO2 92 %. Physical Exam  Nursing note reviewed. Constitutional: He is oriented to person, place, and time. He appears well-developed  and well-nourished.  HENT:  Head: Normocephalic and atraumatic.  Mouth/Throat: Oropharynx is clear and moist.  Eyes: EOM are normal. Pupils are equal, round, and reactive to light. Right conjunctiva is injected. Left conjunctiva is injected.  Neck: Normal range of motion. Neck supple. No tracheal deviation present. No thyromegaly present.  Cardiovascular: Normal rate and regular rhythm.  Exam reveals no friction rub.   Respiratory: Effort normal and breath sounds normal. No stridor. He has no rales. He exhibits no tenderness.  GI: Soft. Bowel sounds are normal. He exhibits no distension. There is no tenderness.  Musculoskeletal: He exhibits edema. He exhibits no tenderness.  2+ pedal edema and 1+ pretibial edema. Multiple dry lesions right tibial and left lateral shin. Left 3 rd nail with surrounding erythema.   Neurological: He is alert and oriented to person, place, and time.  Sitting up in chair-slumped to the left. Speech with moderate dysarthria. Limited vision. Tends to keep eyes closed. Sensory deficits RLE> LLE.  Mild ataxia with finger to nose RUE. Strength 4/5 RUE prox to distal. LUE 5/5. + Pronator drift. RLE: 3+HF and 4-KE , 4/5 ADF/PF. LLE grossly 4+ /5. Poor insight and awareness.  Skin: Skin is warm and dry.  Psychiatric: He has a normal mood and affect. His behavior is normal.    Lab Results Last 48 Hours        Results for orders placed or performed during the hospital encounter of 03/28/16 (from the past 48 hour(s))  Glucose, capillary     Status: Abnormal   Collection Time: 04/01/16 11:10 AM  Result Value Ref Range   Glucose-Capillary 306 (H) 65 - 99 mg/dL  Glucose, capillary     Status: Abnormal   Collection Time: 04/01/16  4:36 PM  Result Value Ref Range   Glucose-Capillary 279 (H) 65 - 99 mg/dL  Glucose, capillary     Status: Abnormal   Collection Time: 04/01/16  9:23 PM  Result Value Ref Range   Glucose-Capillary 224 (H) 65 - 99 mg/dL   Comment 1  Notify RN    Comment 2 Document in Chart   Basic metabolic panel     Status: Abnormal   Collection Time: 04/02/16  2:51 AM  Result Value Ref Range   Sodium 137 135 - 145 mmol/L   Potassium 3.7 3.5 - 5.1 mmol/L   Chloride 104 101 - 111 mmol/L   CO2 26 22 - 32 mmol/L   Glucose, Bld 195 (H) 65 - 99 mg/dL   BUN 18 6 - 20 mg/dL   Creatinine, Ser 1.36 (H) 0.61 - 1.24 mg/dL   Calcium 8.9 8.9 - 10.3 mg/dL   GFR calc non Af Amer 55 (L) >60 mL/min   GFR calc Af Amer >60 >60 mL/min    Comment: (NOTE) The eGFR has been calculated using the CKD EPI equation. This calculation has not been validated in all clinical situations. eGFR's persistently <60 mL/min signify possible Chronic Kidney Disease.    Anion gap 7 5 - 15  Glucose, capillary     Status: Abnormal   Collection Time: 04/02/16  6:37 AM  Result Value Ref Range   Glucose-Capillary 177 (H) 65 - 99 mg/dL  Glucose, capillary     Status: Abnormal   Collection Time: 04/02/16 11:21 AM  Result Value Ref Range   Glucose-Capillary 184 (H) 65 - 99 mg/dL   Comment 1 Notify RN    Comment 2 Document in Chart   Glucose, capillary     Status: Abnormal   Collection Time: 04/02/16  4:11 PM  Result Value Ref Range   Glucose-Capillary 216 (H) 65 - 99 mg/dL   Comment 1 Notify RN    Comment 2 Document in Chart   Glucose, capillary     Status: Abnormal   Collection Time: 04/02/16  9:58 PM  Result Value Ref Range   Glucose-Capillary 204 (H) 65 - 99 mg/dL   Comment 1 Notify RN    Comment 2 Document in Chart   Basic metabolic panel     Status: Abnormal   Collection Time: 04/03/16  3:15 AM  Result Value Ref Range   Sodium 141 135 - 145 mmol/L   Potassium 3.3 (L) 3.5 - 5.1 mmol/L   Chloride 103 101 - 111 mmol/L   CO2 26 22 - 32 mmol/L   Glucose, Bld 149 (H) 65 - 99 mg/dL   BUN 23 (H) 6 - 20 mg/dL   Creatinine, Ser 1.43 (H) 0.61 - 1.24 mg/dL   Calcium 9.2 8.9 - 10.3 mg/dL   GFR calc non Af Amer 52  (L) >60 mL/min   GFR calc Af Amer >60 >60 mL/min    Comment: (NOTE)  The eGFR has been calculated using the CKD EPI equation. This calculation has not been validated in all clinical situations. eGFR's persistently <60 mL/min signify possible Chronic Kidney Disease.    Anion gap 12 5 - 15  Glucose, capillary     Status: Abnormal   Collection Time: 04/03/16  6:49 AM  Result Value Ref Range   Glucose-Capillary 155 (H) 65 - 99 mg/dL   Comment 1 Notify RN    Comment 2 Document in Chart      Imaging Results (Last 48 hours)  No results found.       Medical Problem List and Plan: 1.  Right hemiparesis and functional deficits secondary to left paramedian pontine infarct             -admit to inpatient rehab 2. H/o DVT /Anticoagulation: Pharmaceutical: Xarelto 3. Pain Management: N/A.  4. Mood: LCSW to follow for evaluation and support.  5. Neuropsych: This patient is not yet capable of making decisions on his own behalf. 6. Skin/Wound Care: Routine pressure relief measures.  7. Fluids/Electrolytes/Nutrition: Monitor I/O. Check lytes in am 8. HTN: Monitor BP qid for now--avoid hypotension. Blood pressure slowly improving with titration of medications--continue chlorthalidone, coreg, cozaar, Bidil and lasix.     9. T2DM with retinopathy and neuropathy: Blood sugars poorly controlled with A1C- 11.0. Consult RD to educated on [place]. Monitor BS ac/hs. Continue Novolog 70/30 bid and titrate as needed for tighter control. 10. CAD/ Acute systolic CHF/NICM: compensated. Check daily weights and monitor for signs of overload. Heart healthy/low salt diet. Will need perfusion study on outpatient basis per cardiology.  On lasix, Bidil, cozaar, hygroton and coreg.  11. Obesity: BMI - 36. Encourage appropriate diet, weight loss and exercise.  12. CKD: Anticipate some rise due to diuresis from of lasix, ch and hydralazine. Will monitor with serial check of lytes.  13. Hypokalemia: was  supplemented today. Will add low dose daily supplement.    Post Admission Physician Evaluation: 1. Functional deficits secondary  to left paramedian pontine infarct. 2. Patient is admitted to receive collaborative, interdisciplinary care between the physiatrist, rehab nursing staff, and therapy team. 3. Patient's level of medical complexity and substantial therapy needs in context of that medical necessity cannot be provided at a lesser intensity of care such as a SNF. 4. Patient has experienced substantial functional loss from his/her baseline which was documented above under the "Functional History" and "Functional Status" headings.  Judging by the patient's diagnosis, physical exam, and functional history, the patient has potential for functional progress which will result in measurable gains while on inpatient rehab.  These gains will be of substantial and practical use upon discharge  in facilitating mobility and self-care at the household level. 5. Physiatrist will provide 24 hour management of medical needs as well as oversight of the therapy plan/treatment and provide guidance as appropriate regarding the interaction of the two. 6. The Preadmission Screening has been reviewed and patient status is unchanged unless otherwise stated above. 7. 24 hour rehab nursing will assist with bladder management, bowel management, safety, skin/wound care, disease management, medication administration, pain management and patient education  and help integrate therapy concepts, techniques,education, etc. 8. PT will assess and treat for/with: Lower extremity strength, range of motion, stamina, balance, functional mobility, safety, adaptive techniques and equipment, NMR, visual-perceptual awareness, cognitive intregration.   Goals are: supervision to mod I. 9. OT will assess and treat for/with: ADL's, functional mobility, safety, upper extremity strength, adaptive techniques and equipment, NMR,  family education,  visual-perceptual awareness.   Goals are: mod I to min assist. Therapy may proceed with showering this patient. 10. SLP will assess and treat for/with: cognition, communication, swallowing.  Goals are: mod I to supervision. 11. Case Management and Social Worker will assess and treat for psychological issues and discharge planning. 12. Team conference will be held weekly to assess progress toward goals and to determine barriers to discharge. 13. Patient will receive at least 3 hours of therapy per day at least 5 days per week. 14. ELOS: 15-20 days       15. Prognosis:  excellent     Meredith Staggers, MD, [facility] 04/03/2016  [place], [place], Hershal Coria 04/03/2016   Note and exam were completed on 04/03/16.

## 2016-04-04 NOTE — Evaluation (Signed)
Occupational Therapy Assessment and Plan  Patient Details  Name: [name] MRN: [id number] Date of Birth: 08/19/1955  OT Diagnosis: abnormal posture, ataxia, cognitive deficits and muscle weakness (generalized) Rehab Potential: Rehab Potential (ACUTE ONLY): Good ELOS: 18-21 days   Today's Date: 04/04/2016 OT Individual Time: 0800-0900 OT Individual Time Calculation (min): 60 min     Problem List:  Patient Active Problem List   Diagnosis Date Noted  . History of DVT (deep vein thrombosis)   . Coronary artery disease involving native coronary artery of native heart without angina pectoris   . Morbid obesity ([place])   . Stage 2 chronic kidney disease   . Hypokalemia   . Acute blood loss anemia   . Embolic stroke involving posterior cerebral artery ([place]) 04/03/2016  . Brainstem stroke ([place]) 04/01/2016  . Abnormality of gait and mobility   . Cerebral infarction due to thrombosis of other cerebral artery ([place])   . Right arm numbness   . Left sided numbness 03/29/2016  . TIA (transient ischemic attack) 03/29/2016  . Hypertensive urgency 03/29/2016  . Sepsis ([place]) 09/10/2013  . DVT (deep venous thrombosis) ([place]) 01/01/2013  . Rash and nonspecific skin eruption 12/29/2012  . Cellulitis and abscess of leg 12/29/2012  . Diabetes type 2, uncontrolled ([place]) 12/29/2012  . Hypertension   . Cardiomyopathy ([place])   . CHF (congestive heart failure) ([place])   . Peripheral neuropathy ([place])   . Special screening for malignant neoplasms, colon 12/09/2012    Past Medical History:  Past Medical History:  Diagnosis Date  . Adrenal insufficiency ([place])   . Allergy   . Arthritis   . Blind left eye   . Carpal tunnel syndrome   . Cataract    bil removed  . CHF (congestive heart failure) ([place])   . Diabetes mellitus without complication ([place])   . ED (erectile dysfunction)   . Gout   . Herpes   . Hypercholesterolemia   . Hypertension   . MI (myocardial infarction)   . Peripheral neuropathy  ([place])   . Pneumonia   . PVD (peripheral vascular disease) ([place])    Past Surgical History:  Past Surgical History:  Procedure Laterality Date  . CHOLECYSTECTOMY    . dilatera cataracts removed    . right knee surgary    . stents      Assessment & Plan Clinical Impression: Patient is a 61 y.o. year old male with recent admission to the hospital on 03/28/2016 with right upper extremity and right lower extremity numbness. 03/28/2016 MRI demonstrated old bilateral thalamic and bilateral basal ganglia lacunar infarcts. Multiple old pontine and cerebellar infarcts. MRI of cervical spine showed facet degenerative changes. Right central disc protrusion C3-C4.MRA showed right greater left carotid siphon stenosis, moderate basilar artery stenosis. On 03/30/2016. Neurology was consulted, note of a severe bilateral peripheral neuropathy with absent sensation to the knees. Repeat MRI of the brain was ordered to 04/17/2016 demonstrate left paramedian pontine infarct. 9 mm acute  Patient transferred to CIR on 04/03/2016 .    Patient currently requires total with basic self-care skills secondary to muscle weakness, impaired timing and sequencing, abnormal tone, unbalanced muscle activation, ataxia, decreased coordination and decreased motor planning, decreased visual perceptual skills, decreased midline orientation, decreased attention to right and decreased motor planning, decreased initiation, decreased attention, decreased awareness, decreased problem solving, decreased safety awareness, decreased memory and delayed processing and decreased sitting balance, decreased standing balance, decreased postural control and decreased balance strategies.  Prior to hospitalization, patient  could complete ADL and iADL with independent .  Patient will benefit from skilled intervention to decrease level of assist with basic self-care skills prior to discharge to SNF.  Anticipate patient will require minimal physical assistance  and OT at SNF.  OT - End of Session Endurance Deficit: Yes Endurance Deficit Description: required multiple rest breaks during ADL tasks OT Assessment Rehab Potential (ACUTE ONLY): Good Barriers to Discharge: Decreased caregiver support Barriers to Discharge Comments: Daughters unable to provide physical assistance at dc OT Patient demonstrates impairments in the following area(s): Balance;Behavior;Cognition;Endurance;Motor;Perception;Safety;Sensory;Vision OT Basic ADL's Functional Problem(s): Eating;Grooming;Dressing;Bathing;Toileting OT Transfers Functional Problem(s): Toilet;Tub/Shower OT Additional Impairment(s): Fuctional Use of Upper Extremity OT Plan OT Intensity: Minimum of 1-2 x/day, 45 to 90 minutes OT Frequency: 5 out of 7 days OT Duration/Estimated Length of Stay: 18-21 days OT Treatment/Interventions: Balance/vestibular training;Cognitive remediation/compensation;Community reintegration;Discharge planning;DME/adaptive equipment instruction;Functional mobility training;Neuromuscular re-education;Functional electrical stimulation;Pain management;Patient/family education;Self Care/advanced ADL retraining;Therapeutic Activities;Therapeutic Exercise;UE/LE Strength taining/ROM;UE/LE Coordination activities;Visual/perceptual remediation/compensation;Wheelchair propulsion/positioning OT Self Feeding Anticipated Outcome(s): Supervision OT Basic Self-Care Anticipated Outcome(s): Min A OT Toileting Anticipated Outcome(s): Min A OT Bathroom Transfers Anticipated Outcome(s): Min A OT Recommendation Patient destination: Skilled Nursing Facility (SNF) Follow Up Recommendations: Skilled nursing facility Equipment Recommended: Other (comment) (likely none 2/2 dc to SNF)  Skilled Therapeutic Intervention Initial eval completed with treatment provided to address functional transfers, adapted bathing/dressing, attention, awareness, and functional use of R Ue. Pt with very flat affect, and  delayed response to commands. Squat-pivot transfers bed<>wc<>tub transfer bench<>wc with Max/total A + max multimodal cues for initiation and motor planning. Incorporated forced use of R UE during bathing/dressing tasks-pt required total A overall for bathing/dressing tasks. Sit<>stand at the sink with Max A + R knee block and Mod/Max A to maintain standing balance to pull pants over hips. Pt then reported need for BM and OT unable to acquire drop arm commode so pt transferred back to bed in similar fashion as above for bed plan placement.    OT Evaluation Precautions/Restrictions  Precautions Precautions: Fall Restrictions Weight Bearing Restrictions: No Pain Pain Assessment Pain Assessment: 0-10 Pain Score: 0-No pain Home Living/Prior Functioning Home Living Living Arrangements: Alone Available Help at Discharge: Family, Available PRN/intermittently Type of Home: Apartment Home Access: Stairs to enter [employer] of Steps: 18 steps "have to go up then down to get into my apartment" Can enter through sliding door without any steps, but someone has to be with him to go into apartment and unlock slilding patio door. Entrance Stairs-Rails: Right, Left Home Layout: One level Bathroom Shower/Tub: Tub/shower unit, Engineer, building services: Handicapped height Bathroom Accessibility: Yes  Lives With: Alone IADL History Homemaking Responsibilities: Yes Cleaning Responsibility: No Mode of Transportation: Cab IADL Comments: Pt reports he took a cab to go grocery shopping and enjoys watching TV Prior Function Level of Independence: Independent with basic ADLs, Independent with homemaking with ambulation, Independent with transfers, Independent with gait  Able to Take Stairs?: Yes Driving: No Vocation: On disability Leisure: Hobbies-yes (Comment) (cooking; TV ) Comments: pt wants his strength back ADL ADL ADL Comments: Please see functional navigator Vision/Perception  Vision-  History Baseline Vision/History: Legally blind (blind in L; blurred vision in R) Vision- Assessment Vision Assessment?: Vision impaired- to be further tested in functional context Additional Comments: blind in L eye; Reduced vision in R eye  Cognition Overall Cognitive Status: Impaired/Different from baseline Arousal/Alertness: Awake/alert Orientation Level: Person;Place;Situation Person: Oriented Place: Oriented Situation: Oriented (stated my L side went numb) Year: 2018 Month: February Day  of Week: Correct Memory: Impaired Memory Impairment: Decreased recall of new information Immediate Memory Recall: Bed;Blue;Sock Memory Recall: Blue Memory Recall Blue: Without Cue Attention: Sustained Sustained Attention: Impaired Sustained Attention Impairment: Verbal basic;Functional basic Awareness: Impaired Awareness Impairment: Anticipatory impairment Problem Solving: Impaired Problem Solving Impairment: Verbal basic;Functional basic Executive Function: Initiating;Self Monitoring;Self Correcting Initiating: Impaired Initiating Impairment: Verbal basic;Functional basic Self Monitoring: Impaired Self Monitoring Impairment: Verbal basic;Functional basic Self Correcting: Impaired Self Correcting Impairment: Verbal basic;Functional basic Safety/Judgment: Impaired (impulsive) Sensation Sensation Light Touch: Impaired Detail Light Touch Impaired Details: Impaired RLE;Impaired LLE;Impaired RUE (No light touch on bilateral feet) Proprioception: Impaired Detail Proprioception Impaired Details: Impaired RLE;Impaired RUE Coordination Gross Motor Movements are Fluid and Coordinated: No Fine Motor Movements are Fluid and Coordinated: No Finger Nose Finger Test: poor coordination of R UE Motor  Motor Motor: Ataxia;Abnormal postural alignment and control Motor - Skilled Clinical Observations: R lateral lean, ataxic  Mobility  Transfers Sit to Stand: 1: +2 Total assist;With upper extremity  assist;With armrests Sit to Stand Details: Tactile cues for weight shifting;Verbal cues for sequencing;Verbal cues for technique;Manual facilitation for weight shifting;Manual facilitation for placement  Trunk/Postural Assessment  Postural Control Postural Control: Deficits on evaluation Trunk Control: Pt deviated to R; pt can correct deviation when cued  Balance Balance Balance Assessed: Yes Static Sitting Balance Static Sitting - Balance Support: Bilateral upper extremity supported;Feet supported Static Sitting - Level of Assistance: 5: Stand by assistance Static Sitting - Comment/# of Minutes: Pt required verbal cues to self-correct lateral lean to R Dynamic Sitting Balance Sitting balance - Comments: Able to statically sit briefly without UE support.  Extremity/Trunk Assessment RUE Assessment RUE Assessment: Exceptions to WFL (4/5 overall, limited mostly 2/2 coordination deficits) LUE Assessment LUE Assessment: Within Functional Limits  See Function Navigator for Current Functional Status.   Refer to Care Plan for Long Term Goals  Recommendations for other services: None   Discharge Criteria: Patient will be discharged from OT if patient refuses treatment 3 consecutive times without medical reason, if treatment goals not met, if there is a change in medical status, if patient makes no progress towards goals or if patient is discharged from hospital.  The above assessment, treatment plan, treatment alternatives and goals were discussed and mutually agreed upon: by patient  Valma Cava 04/04/2016, 4:19 PM

## 2016-04-04 NOTE — Evaluation (Signed)
Physical Therapy Assessment and Plan  Patient Details  Name: [name] MRN: [id number] Date of Birth: 07-Jun-1955  PT Diagnosis: Abnormal posture, Abnormality of gait, Ataxia, Ataxic gait, Cognitive deficits, Coordination disorder, Difficulty walking, Edema, Impaired cognition and Impaired sensation Rehab Potential: Fair ELOS:  15-20 days   Today's Date: 04/04/2016 PT Individual Time: 1001-1103 PT Individual Time Calculation (min): 62 min    Problem List:  Patient Active Problem List   Diagnosis Date Noted  . History of DVT (deep vein thrombosis)   . Coronary artery disease involving native coronary artery of native heart without angina pectoris   . Morbid obesity ([place])   . Stage 2 chronic kidney disease   . Hypokalemia   . Acute blood loss anemia   . Embolic stroke involving posterior cerebral artery ([place]) 04/03/2016  . Brainstem stroke ([place]) 04/01/2016  . Abnormality of gait and mobility   . Cerebral infarction due to thrombosis of other cerebral artery ([place])   . Right arm numbness   . Left sided numbness 03/29/2016  . TIA (transient ischemic attack) 03/29/2016  . Hypertensive urgency 03/29/2016  . Sepsis ([place]) 09/10/2013  . DVT (deep venous thrombosis) ([place]) 01/01/2013  . Rash and nonspecific skin eruption 12/29/2012  . Cellulitis and abscess of leg 12/29/2012  . Diabetes type 2, uncontrolled ([place]) 12/29/2012  . Hypertension   . Cardiomyopathy ([place])   . CHF (congestive heart failure) ([place])   . Peripheral neuropathy ([place])   . Special screening for malignant neoplasms, colon 12/09/2012    Past Medical History:  Past Medical History:  Diagnosis Date  . Adrenal insufficiency ([place])   . Allergy   . Arthritis   . Blind left eye   . Carpal tunnel syndrome   . Cataract    bil removed  . CHF (congestive heart failure) ([place])   . Diabetes mellitus without complication ([place])   . ED (erectile dysfunction)   . Gout   . Herpes   . Hypercholesterolemia   . Hypertension    . MI (myocardial infarction)   . Peripheral neuropathy ([place])   . Pneumonia   . PVD (peripheral vascular disease) ([place])    Past Surgical History:  Past Surgical History:  Procedure Laterality Date  . CHOLECYSTECTOMY    . dilatera cataracts removed    . right knee surgary    . stents      Assessment & Plan Clinical Impression:  [name] is a 61 year old male with history of CAD, with ICM, diastolic CHF, G2XB, peripheral neuropathy, DVT- on Xarelto who was admitted on 03/28/16 with right sided numbness. He reported running out of Xarelto for 3 days PTA. BP elevated in ED and patient with ST changes in inferolateral leads. UDS negative. 2 D echo done revealing mild LVH with global hypokinesis with akinesis of inferolateral wall and EF 25- 30% with grade 1. Dr. Warren Lacy recommended outpatient perfusion study and Bidil and coreg added for management of cardiomyopathy.MRI brain done revealing old bilateral thalamic infarcts, bilateral basal ganglia lacunar infarcts, multiple old pontine and cerebellar infarcts.MRA head/neck done revealing severe stenosis B-ICA with multifocal moderate stenosis posterior circulation and occluded R-VA without significant reconstitution. MRI cervical spine showed cervical spondylosis with discogenic and facet degenerative changes with right central disc protrusion C3-C4. Neurology consulted for input on severe bilateral peripheral neuropathy with absent sensation to knees.Repeat MRI brain done revealing 9 mm acute ischemic infarct within left paramedian pons.Therapy evaluations done revealing right sided weakness with vision deficits, unsteady  gait with inability to advance RLE and ataxic gait. CIR recommended for follow up therapy.    Patient transferred to CIR on 04/03/2016 .   Patient currently requires total with mobility secondary to decreased cardiorespiratoy endurance, impaired timing and sequencing, unbalanced muscle activation, ataxia, decreased  coordination and decreased motor planning, decreased attention, decreased problem solving, decreased memory and delayed processing and decreased sitting balance, decreased standing balance, decreased postural control and decreased balance strategies.  Prior to hospitalization, patient was independent  with mobility and lived with Alone in a [place] home.  Home access is 18 steps "have to go up then down to get into my apartment" Can enter through sliding door without any steps, but someone has to be with him to go into apartment and unlock slilding patio door.Stairs to enter.  Patient will benefit from skilled PT intervention to maximize safe functional mobility, minimize fall risk and decrease caregiver burden for planned discharge [place].  Anticipate patient will continued PT at SNF at discharge.  PT - End of Session Activity Tolerance: Tolerates < 10 min activity, no significant change in vital signs Endurance Deficit: Yes Endurance Deficit Description: required multiple rest breaks during ADL tasks PT Assessment Rehab Potential (ACUTE/IP ONLY): Fair Barriers to Discharge: Decreased caregiver support PT Plan PT Intensity: Minimum of 1-2 x/day ,45 to 90 minutes PT Frequency: 5 out of 7 days PT Duration Estimated Length of Stay: 15-20 days PT Treatment/Interventions: Ambulation/gait training;Balance/vestibular training;Community reintegration;Cognitive remediation/compensation;Discharge planning;Disease management/prevention;DME/adaptive equipment instruction;Functional mobility training;Neuromuscular re-education;Patient/family education;Pain management;Skin care/wound management;Therapeutic Activities;Therapeutic Exercise;UE/LE Coordination activities;UE/LE Strength taining/ROM;Visual/perceptual remediation/compensation;Wheelchair propulsion/positioning;Stair training PT Recommendation Recommendations for Other Services: Speech consult;Neuropsych consult;Therapeutic Recreation  consult Therapeutic Recreation Interventions: Kitchen group Follow Up Recommendations: Skilled nursing facility Patient destination: [facility] (SNF) Equipment Recommended: None recommended by PT  Skilled Therapeutic Intervention Patient was lying in bed using bed pan upon arrival with no success with BM . Pt was dressed dependently with brief and gown of time management. Pt was instructed through evaluation before participating in therapeutic intervention. Pt demonstrated sitting balance with close supervision and UE support with lateral lean to R with inability to maintain upright posture but able to correct with cues. Pt was max to +2A assist with bed mobility, transfers, and ambulation. Pt was returned to bed after treatment.   PT Evaluation Precautions/Restrictions Precautions Precautions: Fall Restrictions Weight Bearing Restrictions: No Pain Pt denied any pain Home Living/Prior Functioning Home Living Living Arrangements: Alone Available Help at Discharge: Family;Available PRN/intermittently Type of Home: Apartment Home Access: Stairs to enter Entrance Stairs-Number of Steps: 18 steps "have to go up then down to get into my apartment" Can enter through sliding door without any steps, but someone has to be with him to go into apartment and unlock slilding patio door. Entrance Stairs-Rails: Right;Left Home Layout: One level Bathroom Shower/Tub: Tub/shower unit;Curtain Bathroom Toilet: Handicapped height Bathroom Accessibility: Yes  Lives With: Alone Prior Function Level of Independence: Independent with basic ADLs;Independent with homemaking with ambulation;Independent with transfers;Independent with gait  Able to Take Stairs?: Yes Driving: No Vocation: On disability Leisure: Hobbies-yes (Comment) (cooking; TV ) Comments: pt wants his strength back Vision/Perception  Vision - Assessment Additional Comments: blind in L eye; Reduced vision in R eye   Cognition Overall Cognitive Status: Impaired/Different from baseline Arousal/Alertness: Awake/alert Orientation Level: Oriented X4 Attention: Sustained Sustained Attention: Impaired Sustained Attention Impairment: Verbal basic;Functional basic Memory: Impaired Memory Impairment: Decreased recall of new information Awareness: Impaired Awareness Impairment: Anticipatory impairment Problem Solving: Impaired Problem  Solving Impairment: Verbal basic;Functional basic Executive Function: Initiating;Self Monitoring;Self Correcting Initiating: Impaired Initiating Impairment: Verbal basic;Functional basic Self Monitoring: Impaired Self Monitoring Impairment: Verbal basic;Functional basic Self Correcting: Impaired Self Correcting Impairment: Verbal basic;Functional basic Safety/Judgment: Impaired (impulsive) Sensation Sensation Light Touch: Impaired Detail Light Touch Impaired Details: Impaired RLE;Impaired LLE;Impaired RUE (No light touch on bilateral feet) Proprioception: Impaired Detail Proprioception Impaired Details: Impaired RLE;Impaired RUE Coordination Gross Motor Movements are Fluid and Coordinated: No Fine Motor Movements are Fluid and Coordinated: No Finger Nose Finger Test: poor coordination of R UE Motor  Motor Motor: Ataxia;Abnormal postural alignment and control Motor - Skilled Clinical Observations: deviations to R from midline that pt can correct with cues; severe ataxic gait with RLE involvement  Mobility Bed Mobility Bed Mobility: Rolling Right;Rolling Left;Supine to Sit;Sit to Supine Rolling Right: 2: Max assist Rolling Right Details: Verbal cues for sequencing;Verbal cues for technique;Manual facilitation for placement;Manual facilitation for weight shifting Rolling Left: 2: Max assist Rolling Left Details: Verbal cues for sequencing;Verbal cues for technique;Manual facilitation for weight shifting;Manual facilitation for placement Supine to Sit: 2: Max  assist;With rails Supine to Sit Details: Verbal cues for technique;Verbal cues for sequencing;Manual facilitation for weight shifting;Manual facilitation for placement;Tactile cues for sequencing Sit to Supine: 2: Max assist Sit to Supine - Details: Verbal cues for technique;Verbal cues for sequencing;Manual facilitation for weight shifting;Manual facilitation for placement Transfers Transfers: Yes Sit to Stand: 1: +2 Total assist;With upper extremity assist;With armrests Sit to Stand Details: Tactile cues for weight shifting;Verbal cues for sequencing;Verbal cues for technique;Manual facilitation for weight shifting;Manual facilitation for placement Squat Pivot Transfers: 1: +1 Total assist;With upper extremity assistance Squat Pivot Transfer Details: Verbal cues for sequencing;Verbal cues for precautions/safety;Verbal cues for technique;Manual facilitation for placement;Manual facilitation for weight shifting Transfer via Lift Equipment: Stedy (min A with stedy) Locomotion  Ambulation Ambulation: Yes Ambulation/Gait Assistance: 1: +2 Total assist (with W/C follow) Ambulation Distance (Feet): 30 Feet Assistive device: 2 person hand held assist Ambulation/Gait Assistance Details: Manual facilitation for placement;Manual facilitation for weight shifting;Verbal cues for gait pattern;Verbal cues for precautions/safety;Verbal cues for technique;Verbal cues for sequencing;Tactile cues for placement;Tactile cues for posture;Tactile cues for weight shifting;Tactile cues for sequencing Ambulation/Gait Assistance Details: deviation from midline to L that pt only corrects with cues; ataxic gait with RLE requiring manual assist for sequence; max cues for sequence of gait Gait Gait: Yes Gait Pattern: Impaired Gait Pattern: Ataxic;Lateral trunk lean to right;Decreased stride length;Trunk flexed;Decreased step length - left;Step-to pattern Gait velocity: significantly reduced Stairs / Additional  Locomotion Stairs: No Wheelchair Mobility Wheelchair Mobility: No  Trunk/Postural Assessment  Cervical Assessment Cervical Assessment: Exceptions to [facility] (forward head) Thoracic Assessment Thoracic Assessment: Exceptions to [facility] (increased kyphotic curvature) Lumbar Assessment Lumbar Assessment: Exceptions to [facility] (increased lumbar flexion; posterior pelvic tilt) Postural Control Postural Control: Deficits on evaluation Trunk Control: Increased lateral lean to R due to impaired postural control which pt can correct when cued  Balance Balance Balance Assessed: Yes Static Sitting Balance Static Sitting - Balance Support: Bilateral upper extremity supported;Feet supported Static Sitting - Level of Assistance: 5: Stand by assistance Static Sitting - Comment/# of Minutes: Pt required verbal cues to self-correct lateral lean to R Extremity Assessment  RUE Assessment RUE Assessment: Exceptions to WFL (4/5 overall, limited mostly 2/2 coordination deficits) LUE Assessment LUE Assessment: Within Functional Limits RLE Assessment RLE Assessment: Within Functional Limits (4+/5 overall; 5/5 knee extension) LLE Assessment LLE Assessment: Within Functional Limits (4+/5 overall)   See Function Navigator for Current Functional Status.  Refer to Care Plan for Long Term Goals  Recommendations for other services: Neuropsych  Discharge Criteria: Patient will be discharged from PT if patient refuses treatment 3 consecutive times without medical reason, if treatment goals not met, if there is a change in medical status, if patient makes no progress towards goals or if patient is discharged from hospital.  The above assessment, treatment plan, treatment alternatives and goals were discussed and mutually agreed upon: by patient  Rosendo Gros 04/04/2016, 3:08 PM

## 2016-04-04 NOTE — Plan of Care (Signed)
Problem: Food- and Nutrition-Related Knowledge Deficit (NB-1.1) Goal: Nutrition education Formal process to instruct or train a patient/client in a skill or to impart knowledge to help patients/clients voluntarily manage or modify food choices and eating behavior to maintain or improve health. Outcome: Completed/Met Date Met: 04/04/16  RD consulted for nutrition education regarding heart healthy/carbohydrate modified diet.   Lab Results  Component Value Date   HGBA1C 11.0 (H) 03/29/2016    RD provided "Carbohydrate Counting for People with Diabetes" handout from the Academy of Nutrition and Dietetics. Noted pt was fatigued during time of visit. Discussed different food groups and their effects on blood sugar, emphasizing carbohydrate-containing foods. Provided list of carbohydrates and recommended serving sizes of common foods.  Discussed importance of controlled and consistent carbohydrate intake throughout the day. Pt reports consuming large amount of fruit juice throughout the day. Discussed diabetic friendly drink options and to decreased sugar sweetened beverages. Pt expressed understanding.  RD additionally provided "Heart Healthy Nutrition Therapy" handout from the Academy of Nutrition and Dietetics. Reviewed patient's dietary recall. Provided examples on ways to decrease sodium and fat intake in diet. Discouraged intake of processed foods and use of salt shaker. Teach back method used.  Expect fair compliance.  Body mass index is 38.19 kg/m. Pt meets criteria for class II obesity based on current BMI.  Current diet order is heart healthy/carbohydrate modified, patient is consuming approximately 80-100% of meals at this time. Labs and medications reviewed. Pt currently has Prostat ordered and has been consuming them. No further nutrition interventions warranted at this time. RD contact information provided. If additional nutrition issues arise, please re-consult RD.  Corrin Parker,  MS, RD, LDN Pager # [phone] After hours/ weekend pager # [phone]

## 2016-04-04 NOTE — Progress Notes (Signed)
Patient information reviewed and entered into eRehab system by Countess Biebel, RN, CRRN, PPS Coordinator.  Information including medical coding and functional independence measure will be reviewed and updated through discharge.     Per nursing patient was given "Data Collection Information Summary for Patients in Inpatient Rehabilitation Facilities with attached "Privacy Act Statement-Health Care Records" upon admission.  

## 2016-04-04 NOTE — Progress Notes (Signed)
Occupational Therapy Session Note  Patient Details  Name: MENACHEM YUDIN MRN: [id number] Date of Birth: [birth year]  Today's Date: 04/04/2016 OT Individual Time: 1300-1343 OT Individual Time Calculation (min): 43 min    Short Term Goals: Week 1:     Skilled Therapeutic Interventions/Progress Updates:    Upon entering the room, pt supine in bed with very flat affect. Pt with no c/o pain this session. Pt performed supine >sit to EOB with max A for trunk and B LEs. Pt standing from bed into STEDY with mod A. Pt heavily pushing to the R where therapist was providing assistance for safety. OT utilized mirror for visual feedback once pt seated on STEDY. Once pt standing, he began to urinate on self and bed. Pt did report he needed to use bathroom but already going at this point. OT transferred pt onto toilet via STEDY assistance and provided total A for brief change and gown change. Pt returned to bed at end of session with STEDT as well. Pt standing from elevated seat with max A for lifting and lowering assistance as well as manual facilitation due to pushing towards the R. Pt required total A for sit >supine. Call bell and all needed items within reach upon exiting the room.   Therapy Documentation Precautions:  Precautions Precautions: Fall Restrictions Weight Bearing Restrictions: No General:   Vital Signs: Therapy Vitals Pulse Rate: 71 BP: (!) 137/57 Patient Position (if appropriate): Sitting Oxygen Therapy SpO2: 100 % Pain:   ADL:   Exercises:   Other Treatments:    See Function Navigator for Current Functional Status.   Therapy/Group: Individual Therapy  Gypsy Decant 04/04/2016, 1:59 PM

## 2016-04-05 ENCOUNTER — Inpatient Hospital Stay (HOSPITAL_COMMUNITY): Payer: Medicare Other | Admitting: *Deleted

## 2016-04-05 ENCOUNTER — Inpatient Hospital Stay (HOSPITAL_COMMUNITY): Payer: Medicare Other | Admitting: Speech Pathology

## 2016-04-05 ENCOUNTER — Inpatient Hospital Stay (HOSPITAL_COMMUNITY): Payer: Medicare Other | Admitting: Occupational Therapy

## 2016-04-05 ENCOUNTER — Inpatient Hospital Stay (HOSPITAL_COMMUNITY): Payer: Medicare Other | Admitting: Physical Therapy

## 2016-04-05 DIAGNOSIS — I150 Renovascular hypertension: Secondary | ICD-10-CM

## 2016-04-05 LAB — GLUCOSE, CAPILLARY
GLUCOSE-CAPILLARY: 102 mg/dL — AB (ref 65–99)
GLUCOSE-CAPILLARY: 111 mg/dL — AB (ref 65–99)
GLUCOSE-CAPILLARY: 91 mg/dL (ref 65–99)
Glucose-Capillary: 150 mg/dL — ABNORMAL HIGH (ref 65–99)

## 2016-04-05 MED ORDER — CHLORTHALIDONE 25 MG PO TABS
37.5000 mg | ORAL_TABLET | Freq: Every day | ORAL | Status: DC
Start: 2016-04-06 — End: 2016-04-09
  Administered 2016-04-06 – 2016-04-09 (×4): 37.5 mg via ORAL
  Filled 2016-04-05 (×4): qty 1.5

## 2016-04-05 MED ORDER — HYDROCERIN EX CREA
TOPICAL_CREAM | Freq: Two times a day (BID) | CUTANEOUS | Status: DC
  Administered 2016-04-05: 1 via TOPICAL
  Administered 2016-04-05 – 2016-04-06 (×2): via TOPICAL
  Administered 2016-04-06: 1 via TOPICAL
  Administered 2016-04-07: 10:00:00 via TOPICAL
  Administered 2016-04-07: 1 via TOPICAL
  Administered 2016-04-08 – 2016-04-24 (×32): via TOPICAL
  Filled 2016-04-05: qty 113

## 2016-04-05 NOTE — Progress Notes (Signed)
Occupational Therapy Session Note  Patient Details  Name: [name] MRN: [id number] Date of Birth: 08/09/1955  Today's Date: 04/05/2016  Session 1 OT Individual Time: 1110-1205 OT Individual Time Calculation (min): 48 mi  Session 2 OT Individual Time: 1401-1430 OT Individual Time Calculation (min): 29 min    Short Term Goals: Week 1:  OT Short Term Goal 1 (Week 1): Pt will complete LB dressing with max assist of one caregiver OT Short Term Goal 2 (Week 1): Pt will independently place RUE into functional position during self-care tasks to improve awareness of RUE/minimize UE injury OT Short Term Goal 3 (Week 1): Pt will complete sit > stand with mod assist to decrease burden of care with LB dressing/hygiene  Skilled Therapeutic Interventions/Progress Updates:  Session 1   1:1 OT session focused on modified bathing and dressing techniques, functional use of R Ue, improved sit<>stand, attention, and coordination. Pt greeted in [place] with nurse present administering meds. Bathing/dressing completed w/ sit<>stand at the sink. Pt required Max A + R knee block to achieve stand, Mod A for standing balance with hips pressed against sink. Total A for LB dressing 2/2 ataxia and difficulty reaching to thread pants. Pt able to put head through opening of shirt today but required assist to orient clothing and thread B UE 's. Grooming tasks completed in sitting at the sink with focus on sequencing and initiation. Max vc to initiate and sequence activity. Undershooting when reaching to run water over toothbrush, and hand-over-hand assist required to squeeze toothpaste and apply to toothbrush. VC required throughout session to keep eyes open and utilize R UE with self-care tasks. Pt left seated in TIS wc at end of session with safety belt on and needs met.   Session 2 Pt greeted in [place] with nursing student administering medications and taking vitals. Pt completed scooting transfer to L with Max A and  max verbal/tactile cues for anterior weight shift. Addressed sitting balance, midline orientation, and R UE coordination with graded peg board activity seated EOB unsupported. Mirror feedback utilized to achieve midline posture initially and pt able to maintain posture with 75% supervision and intermittent min A. Pt returned to bed and reported need for urinal. Pt required assist for clothing management and assist to place urinal. Nurse tech notified of pt status and pt left semi-reclined in bed with urinal.   Therapy Documentation Precautions:  Precautions Precautions: Fall Restrictions Weight Bearing Restrictions: No Pain: Pain Assessment Pain Assessment: No/denies pain ADL: ADL ADL Comments: Please see functional navigator  See Function Navigator for Current Functional Status.   Therapy/Group: Individual Therapy  Valma Cava 04/05/2016, 4:21 PM

## 2016-04-05 NOTE — Progress Notes (Signed)
[facility] PHYSICAL MEDICINE & REHABILITATION     PROGRESS NOTE  Subjective/Complaints:  Pt seen sitting up in bed this AM.  He slept well overnight and states he had a good first day of therapies.  Spoke with PA regarding drop in CBGs yesterday, insulin adjusted.   ROS: Denies CP, SOB, N/V/D.  Objective: Vital Signs: Blood pressure (!) 148/62, pulse 88, temperature 98 F (36.7 C), temperature source Oral, resp. rate 16, height 5\' 9"  (1.753 m), weight 116.8 kg (257 lb 8 oz), SpO2 100 %. No results found.  Recent Labs  04/04/16 0516  WBC 6.6  HGB 12.6*  HCT 39.2  PLT 247    Recent Labs  04/03/16 0315 04/04/16 0516  NA 141 142  K 3.3* 3.5  CL 103 103  GLUCOSE 149* 87  BUN 23* 20  CREATININE 1.43* 1.21  CALCIUM 9.2 9.4   CBG (last 3)   Recent Labs  04/04/16 1641 04/04/16 2035 04/05/16 0633  GLUCAP 103* 89 102*    Wt Readings from Last 3 Encounters:  04/05/16 116.8 kg (257 lb 8 oz)  03/28/16 111.1 kg (245 lb)  09/04/13 120.2 kg (265 lb)    Physical Exam:  BP (!) 148/62 (BP Location: Right Arm)   Pulse 88   Temp 98 F (36.7 C) (Oral)   Resp 16   Ht 5\' 9"  (1.753 m)   Wt 116.8 kg (257 lb 8 oz)   SpO2 100%   BMI 38.03 kg/m  Constitutional: He appears well-developed. Obese. HENT: Normocephalic and atraumatic.  Eyes: EOM are normal. No discharge.  Cardiovascular: RRR.  No JVD. Respiratory: Effort normal and breath sounds normal.  GI: Soft. Bowel sounds are normal.  Musculoskeletal: He exhibits edema >RLE.Marland Kitchen He exhibits no tenderness.  Neurological: He is alert and oriented.  Dysarthria.  Limited vision.  Tends to keep eyes closed.  Poor insight and awareness.    Sensory deficits RLE> LLE.   Motor: 4+/5 RUE prox to distal. Ataxia.  RLE: 4/+/5 proximal to distal (unchanged) LLE 4+ /5 proximal to distal.  LUE/LLE proximal to distal 5/5.  Skin: Dry scaly skin with scabs b/l distal LE R>L. Psychiatric: Flat affect. Slowed  behavior  Assessment/Plan: 1. Functional deficits secondary to left paramedian pontine infarct which require 3+ hours per day of interdisciplinary therapy in a comprehensive inpatient rehab setting. Physiatrist is providing close team supervision and 24 hour management of active medical problems listed below. Physiatrist and rehab team continue to assess barriers to discharge/monitor patient progress toward functional and medical goals.  Function:  Bathing Bathing position   Position: Shower  Bathing parts Body parts bathed by patient: Right arm, Left arm, Chest, Abdomen Body parts bathed by helper: Front perineal area, Right upper leg, Buttocks, Left upper leg, Right lower leg, Left lower leg  Bathing assist Assist Level:  (Max A)      Upper Body Dressing/Undressing Upper body dressing   What is the patient wearing?: Pull over shirt/dress     Pull over shirt/dress - Perfomed by patient: Thread/unthread left sleeve Pull over shirt/dress - Perfomed by helper: Pull shirt over trunk, Put head through opening, Thread/unthread right sleeve        Upper body assist Assist Level:  (Max A)      Lower Body Dressing/Undressing Lower body dressing   What is the patient wearing?: Pants, Non-skid slipper socks       Pants- Performed by helper: Thread/unthread right pants leg, Pull pants up/down, Thread/unthread left pants leg  Non-skid slipper socks- Performed by helper: Don/doff left sock, Don/doff right sock                  Lower body assist Assist for lower body dressing: 2 Helpers      Toileting Toileting Toileting activity did not occur: No continent bowel/bladder event        Toileting assist     Transfers Chair/bed transfer   Chair/bed transfer method: Squat pivot Chair/bed transfer assist level: Total assist (Pt < 25%) Chair/bed transfer assistive device: Armrests     Locomotion Ambulation     Max distance: 30 ft Assist level: 2 helpers (+2 total  assistance)   Wheelchair   Type: Manual Max wheelchair distance: 100 ft Assist Level: Dependent (Pt equals 0%)  Cognition Comprehension Comprehension assist level: Understands basic 50 - 74% of the time/ requires cueing 25 - 49% of the time  Expression Expression assist level: Expresses basic 50 - 74% of the time/requires cueing 25 - 49% of the time. Needs to repeat parts of sentences.  Social Interaction Social Interaction assist level: Interacts appropriately 50 - 74% of the time - May be physically or verbally inappropriate.  Problem Solving Problem solving assist level: Solves basic 25 - 49% of the time - needs direction more than half the time to initiate, plan or complete simple activities  Memory Memory assist level: Recognizes or recalls 50 - 74% of the time/requires cueing 25 - 49% of the time    Medical Problem List and Plan: 1.  Right hemiparesis and functional deficits secondary to left paramedian pontine infarct on 1/31  Cont CIR 2. H/o DVT /Anticoagulation: Pharmaceutical: Xarelto 3. Pain Management: N/A.  4. Mood: LCSW to follow for evaluation and support.  5. Neuropsych: This patient is not yet capable of making decisions on his own behalf. 6. Skin/Wound Care: Routine pressure relief measures. Eucerin to b/l LE. 7. Fluids/Electrolytes/Nutrition: Monitor I/Os 8. HTN: Monitor BP.   Continue coreg, cozaar, Bidil and lasix.     Chlorthalidone increased on 2/8  Monitor with increased mobility 9. T2DM with retinopathy and neuropathy: Blood sugars poorly controlled with A1C- 11.0. Consult RD to educated on [place]. Monitor BS ac/hs.   Novolog 70/30 adjusted to 55U daily, 30 Qhs  Remains labile, but Improved 2/8  Monitor with increased mobility 10. CAD/ Acute systolic CHF/NICM: compensated. Check daily weights and monitor for signs of overload. Heart healthy/low salt diet. Will need perfusion study on outpatient basis per cardiology. Cont meds Filed Weights   04/03/16 1616  04/04/16 1404 04/05/16 0456  Weight: 117.3 kg (258 lb 9.6 oz) 116.6 kg (256 lb 15.5 oz) 116.8 kg (257 lb 8 oz)  11. Morbid Obesity: BMI - 36. Encourage appropriate diet, weight loss and exercise.  12. CKD:   Cr. 1.21 on 2/7 (around baseline) 13. Hypokalemia: Added low dose daily supplement.  K+ 3.5 on 2/7  Cont to monitor 14. ABLA  Hb 12.6 on 2/7  Cont to monitor  LOS (Days) 2 A FACE TO FACE EVALUATION WAS PERFORMED  Ankit Lorie Phenix 04/05/2016 8:07 AM

## 2016-04-05 NOTE — Progress Notes (Signed)
Social Work Elease Hashimoto, LCSW Social Worker Signed   Patient Care Conference Date of Service: 04/05/2016  8:23 AM      Hide copied text Hover for attribution information Inpatient RehabilitationTeam Conference and Plan of Care Update Date: 04/04/2016   Time: 2:20 PM      Patient Name: [name]      Medical Record Number: :[id number]  Date of Birth: 08/09/1955 Sex: Male         Room/Bed: 4M03C/4M03C-01 Payor Info: Payor: MEDICARE / Plan: MEDICARE PART A AND B / Product Type: *No Product type* /     Admitting Diagnosis: CVA  Admit Date/Time:  04/03/2016  4:08 PM Admission Comments: No comment available    Primary Diagnosis:  <principal problem not specified> Principal Problem: <principal problem not specified>       Patient Active Problem List    Diagnosis Date Noted  . Renovascular hypertension    . History of DVT (deep vein thrombosis)    . Coronary artery disease involving native coronary artery of native heart without angina pectoris    . Morbid obesity ([place])    . Stage 2 chronic kidney disease    . Hypokalemia    . Acute blood loss anemia    . Embolic stroke involving posterior cerebral artery ([place]) 04/03/2016  . Brainstem stroke ([place]) 04/01/2016  . Abnormality of gait and mobility    . Cerebral infarction due to thrombosis of other cerebral artery ([place])    . Right arm numbness    . Left sided numbness 03/29/2016  . TIA (transient ischemic attack) 03/29/2016  . Hypertensive urgency 03/29/2016  . Sepsis ([place]) 09/10/2013  . DVT (deep venous thrombosis) ([place]) 01/01/2013  . Rash and nonspecific skin eruption 12/29/2012  . Cellulitis and abscess of leg 12/29/2012  . Diabetes type 2, uncontrolled ([place]) 12/29/2012  . Hypertension    . Cardiomyopathy ([place])    . CHF (congestive heart failure) ([place])    . Peripheral neuropathy ([place])    . Special screening for malignant neoplasms, colon 12/09/2012      Expected Discharge Date:     Team Members Present: Physician  leading conference: Dr. Delice Lesch Social Worker Present: Ovidio Kin, LCSW Nurse Present: Heather Roberts, RN PT Present: Carney Living, PT OT Present: Cherylynn Ridges, OT SLP Present: Other (comment) (Happi Overton-SP) PPS Coordinator present : Daiva Nakayama, RN, CRRN       Current Status/Progress Goal Weekly Team Focus  Medical     Right hemiparesis and functional deficits secondary to left paramedian pontine infarct  Improve mobility, cogntion, safety, comorbidities  See above   Bowel/Bladder          eval pending     Swallow/Nutrition/ Hydration               ADL's     Max+2 overall,   Min A overall  pt/family ed, attention, awareness,    Mobility     max +2A  min A overall  functional mobility training, RUE/RLE NMR, coordination, postural control, midline reorientation, sitting/standing balance, activity tolerance, pt/fam education   Communication               Safety/Cognition/ Behavioral Observations             Pain          less than 3     Skin          skin issues form diabetes       *See  Care Plan and progress notes for long and short-term goals.   Barriers to Discharge: Cognition, safety, HTN, DM, CKD, hypokalemia, ABLA, CHF     Possible Resolutions to Barriers:  Therapies, optimize HTN, DM meds, follow labs and weights     Discharge Planning/Teaching Needs:    Does not have 24 hr care will need to go to a NH upon discharge from rehab     Team Discussion:  New eval-goals overall min assist level. Currently plus 2 total. Skin issues-RN & MD aware working on. Delayed processing and motor coordination. MD monitoring labs  Revisions to Treatment Plan:  New eval    Continued Need for Acute Rehabilitation Level of Care: The patient requires daily medical management by a physician with specialized training in physical medicine and rehabilitation for the following conditions: Daily direction of a multidisciplinary physical rehabilitation program to ensure safe  treatment while eliciting the highest outcome that is of practical value to the patient.: Yes Daily medical management of patient stability for increased activity during participation in an intensive rehabilitation regime.: Yes Daily analysis of laboratory values and/or radiology reports with any subsequent need for medication adjustment of medical intervention for : Neurological problems;Diabetes problems;Blood pressure problems;Renal problems;Other   Elease Hashimoto 04/05/2016, 8:23 AM       Patient ID: [name], male   DOB: 01-30-1956, 61 y.o.   MRN: [id number]

## 2016-04-05 NOTE — Progress Notes (Signed)
Recreational Therapy Session Note  Patient Details  Name: [name] MRN: [id number] Date of Birth: 1955/04/05 Today's Date: 04/05/2016  TR Eval deferred due to low activity tolerance.  Will continue to monitor through team for future participation. [place] 04/05/2016, 4:20 PM

## 2016-04-05 NOTE — Progress Notes (Signed)
Speech Language Pathology Daily Session Note  Patient Details  Name: [name] MRN: [place]:[id number] Date of Birth: 24-Aug-1955  Today's Date: 04/05/2016 SLP Individual Time: 1445-1530 SLP Individual Time Calculation (min): 45 min  Short Term Goals: Week 1: SLP Short Term Goal 1 (Week 1): Pt will demonstrate sustained attention to basic, familiar tasks for 10 minutes with Mod A cues for redirection.  SLP Short Term Goal 2 (Week 1): Pt will perform medicaiton management tasks with Mod A verbal cues.  SLP Short Term Goal 3 (Week 1): Pt will utilize external memory aids ot recall new, daily information with Mod A verbal cues.  SLP Short Term Goal 4 (Week 1): Pt will demonstrate emergent awareness by identifying 2 cognitive and 2 physical deficits related to CVA wtih Mod A verbal cues.  SLP Short Term Goal 5 (Week 1): Pt will utilize speech intelligibility strategies with Mod A verbal cues to achieve ~>75% intelligibility at the simple conversation level.   Skilled Therapeutic Interventions: Skilled treatment session focused on cognition goals. SLP facilitated session by providing Max A multimodal cues for simple, basic problem solving. Pt required Max A multimodal cues to speak to SLP when spoken too. Pt was left upright in bed, bed alarm on, all needs within reach and daughter present. Continue per current plan of care.      Function:    Cognition Comprehension Comprehension assist level: Understands basic 50 - 74% of the time/ requires cueing 25 - 49% of the time  Expression   Expression assist level: Expresses basic 50 - 74% of the time/requires cueing 25 - 49% of the time. Needs to repeat parts of sentences.  Social Interaction Social Interaction assist level: Interacts appropriately 50 - 74% of the time - May be physically or verbally inappropriate.  Problem Solving Problem solving assist level: Solves basic 50 - 74% of the time/requires cueing 25 - 49% of the time  Memory Memory assist  level: Recognizes or recalls 50 - 74% of the time/requires cueing 25 - 49% of the time    Pain Pain Assessment Pain Assessment: No/denies pain  Therapy/Group: Individual Therapy  [name], M.S., CCC-SLP Speech-Language Pathologist  [name] 04/05/2016, 4:07 PM

## 2016-04-05 NOTE — Discharge Instructions (Signed)
Inpatient Rehab Discharge Instructions  [name] Discharge date and time:    Activities/Precautions/ Functional Status: Activity: no lifting, driving, or strenuous exercise for till cleared by MD Diet: diabetic diet Wound Care: none needed   Functional status:  ___ No restrictions     ___ Walk up steps independently _X__ 24/7 supervision/assistance   ___ Walk up steps with assistance ___ Intermittent supervision/assistance  ___ Bathe/dress independently ___ Walk with walker     _X__ Bathe/dress with assistance ___ Walk Independently    ___ Shower independently ___ Walk with assistance    ___ Shower with assistance ___ No alcohol     ___ Return to work/school ________  Special Instructions:    STROKE/TIA DISCHARGE INSTRUCTIONS SMOKING Cigarette smoking nearly doubles your risk of having a stroke & is the single most alterable risk factor  If you smoke or have smoked in the last 12 months, you are advised to quit smoking for your health.  Most of the excess cardiovascular risk related to smoking disappears within a year of stopping.  Ask you doctor about anti-smoking medications  [place] Quit Line: 1-800-QUIT NOW  Free Smoking Cessation Classes [phone]  CHOLESTEROL Know your levels; limit fat & cholesterol in your diet  Lipid Panel     Component Value Date/Time   CHOL 235 (H) 03/29/2016 0531   TRIG 160 (H) 03/29/2016 0531   HDL 36 (L) 03/29/2016 0531   CHOLHDL 6.5 03/29/2016 0531   VLDL 32 03/29/2016 0531   LDLCALC 167 (H) 03/29/2016 0531      Many patients benefit from treatment even if their cholesterol is at goal.  Goal: Total Cholesterol (CHOL) less than 160  Goal:  Triglycerides (TRIG) less than 150  Goal:  HDL greater than 40  Goal:  LDL (LDLCALC) less than 100   BLOOD PRESSURE American Stroke Association blood pressure target is less that 120/80 mm/Hg  Your discharge blood pressure is:  BP: (!) 138/55  Monitor your blood pressure  Limit your salt  and alcohol intake  Many individuals will require more than one medication for high blood pressure  DIABETES (A1c is a blood sugar average for last 3 months) Goal HGBA1c is under 7% (HBGA1c is blood sugar average for last 3 months)  Diabetes:     Lab Results  Component Value Date   HGBA1C 11.0 (H) 03/29/2016     Your HGBA1c can be lowered with medications, healthy diet, and exercise.  Check your blood sugar as directed by your physician  Call your physician if you experience unexplained or low blood sugars.  PHYSICAL ACTIVITY/REHABILITATION Goal is 30 minutes at least 4 days per week  Activity: No driving, Therapies: see above Return to work: N/A  Activity decreases your risk of heart attack and stroke and makes your heart stronger.  It helps control your weight and blood pressure; helps you relax and can improve your mood.  Participate in a regular exercise program.  Talk with your doctor about the best form of exercise for you (dancing, walking, swimming, cycling).  DIET/WEIGHT Goal is to maintain a healthy weight  Your discharge diet is: Diet heart healthy/carb modified Room service appropriate? Yes; Fluid consistency: Thin liquids Your height is:  Height: 5\' 9"  (175.3 cm) Your current weight is: Weight: 116.6 kg (256 lb 15.5 oz) Your Body Mass Index (BMI) is:  BMI (Calculated): 38.3  Following the type of diet specifically designed for you will help prevent another stroke.  Your goal weight is:  Your goal Body Mass Index (BMI) is 19-24.  Healthy food habits can help reduce 3 risk factors for stroke:  High cholesterol, hypertension, and excess weight.  RESOURCES Stroke/Support Group:  Call [phone]   STROKE EDUCATION PROVIDED/REVIEWED AND GIVEN TO PATIENT Stroke warning signs and symptoms How to activate emergency medical system (call 911). Medications prescribed at discharge. Need for follow-up after discharge. Personal risk factors for stroke. Pneumonia vaccine  given:  Flu vaccine given:  My questions have been answered, the writing is legible, and I understand these instructions.  I will adhere to these goals & educational materials that have been provided to me after my discharge from the hospital.     My questions have been answered and I understand these instructions. I will adhere to these goals and the provided educational materials after my discharge from the hospital.  Patient/Caregiver Signature _______________________________ Date __________  Clinician Signature _______________________________________ Date __________  Please bring this form and your medication list with you to all your follow-up doctor's appointments.    Information on my medicine - XARELTO (Rivaroxaban)  This medication education was reviewed with me or my healthcare representative as part of my discharge preparation.    Why was Xarelto prescribed for you? Xarelto was prescribed for you to reduce the risk of a blood clot forming that can cause a stroke, and for the history of DVT/PE.  What do you need to know about xarelto ? Take your Xarelto 20 mg ONCE DAILY at the same time every day with your evening meal. If you have difficulty swallowing the tablet whole, you may crush it and mix in applesauce just prior to taking your dose.  Take Xarelto exactly as prescribed by your doctor and DO NOT stop taking Xarelto without talking to the doctor who prescribed the medication.  Stopping without other stroke prevention medication to take the place of Xarelto may increase your risk of developing a clot that causes a stroke.  Refill your prescription before you run out.  After discharge, you should have regular check-up appointments with your healthcare provider that is prescribing your Xarelto.  In the future your dose may need to be changed if your kidney function or weight changes by a significant amount.  What do you do if you miss a dose? If you are taking  Xarelto ONCE DAILY and you miss a dose, take it as soon as you remember on the same day then continue your regularly scheduled once daily regimen the next day. Do not take two doses of Xarelto at the same time or on the same day.   Important Safety Information A possible side effect of Xarelto is bleeding. You should call your healthcare provider right away if you experience any of the following: ? Bleeding from an injury or your nose that does not stop. ? Unusual colored urine (red or dark brown) or unusual colored stools (red or black). ? Unusual bruising for unknown reasons. ? A serious fall or if you hit your head (even if there is no bleeding).  Some medicines may interact with Xarelto and might increase your risk of bleeding while on Xarelto. To help avoid this, consult your healthcare provider or pharmacist prior to using any new prescription or non-prescription medications, including herbals, vitamins, non-steroidal anti-inflammatory drugs (NSAIDs) and supplements.  This website has more information on Xarelto: [email or web address]

## 2016-04-05 NOTE — Progress Notes (Signed)
Physical Therapy Session Note  Patient Details  Name: [name] MRN: [place]:[id number] Date of Birth: 12/05/1955  Today's Date: 04/05/2016 PT Individual Time: 0900-1000 PT Individual Time Calculation (min): 60 min   Short Term Goals: Week 1:  PT Short Term Goal 1 (Week 1): Pt will demonstrate bed rolling Mod A PT Short Term Goal 2 (Week 1): Pt will demonstrate sit-to-stand Max A PT Short Term Goal 3 (Week 1): Pt will demonstrate supine <-> sit transfer Mod A  PT Short Term Goal 4 (Week 1): Pt will demonstrate squat pivot transfer mod A  PT Short Term Goal 5 (Week 1): Pt will ambulate 25 ft Total A x 1 with W/C follow  Skilled Therapeutic Interventions/Progress Updates:   Patient in bed upon arrival, upon questioning denied need for bathroom initially but then urgently stating need to urinate, utilized urinal sitting up in bed. Donned TED hose and shoes total A. Performed bed mobility using rail with HOB slightly raised with max A overall and sitting balance EOB with initial cues to reach for bed footboard with LUE to facilitate midline positioning with supervision. Instructed in lateral scoots and squat pivot transfers from bed > TIS wheelchair <> level therapy mat with max A and verbal/tactile cues for sequencing and technique. Gait training using L rail in hallway with max A and +2 w/c follow. Patient required manual placement RLE due to RLE adduction and max multimodal cues for upright posture, advancing LUE forward along rail, and overall sequencing and technique x 12 ft with mirror for visual feedback. Ambulation terminated due to RLE extending too far behind patient to remain standing resulting in need to sit for safety. Attempted another trial of ambulation but patient demonstrating heavy anterior lean and unable to achieve upright posture despite max A to safely advance ambulation. Seated EOM with mirror for visual feedback, performed reaching tasks slightly outside BOS anterior, up, and to L  with LUE to facilitate midline orientation and L lateral trunk elongation/R lateral trunk shortening and reaching tasks using RUE to promote cross body trunk rotation and RUE coordination using horseshoes progressed to reaching for cups with increased difficulty. Patient with progressive lateral lean to R and decreased ability to correct with verbal/tactile/visual cues with increased fatigue.  Patient returned to [facility] wheelchair and left sitting with quick release belt donned and soft call bell on lap, and all needs within reach, RN made aware of pt position.   Therapy Documentation Precautions:  Precautions Precautions: Fall Restrictions Weight Bearing Restrictions: No Pain:  Unrated R hip pain, provided rest and repositioned   See Function Navigator for Current Functional Status.   Therapy/Group: Individual Therapy  Graviel Payeur, Murray Hodgkins 04/05/2016, 10:12 AM

## 2016-04-05 NOTE — Plan of Care (Signed)
Problem: RH SKIN INTEGRITY Goal: RH STG SKIN FREE OF INFECTION/BREAKDOWN Skin Free of Infection/Breakdown with min assist  Mod assist

## 2016-04-05 NOTE — Patient Care Conference (Signed)
Inpatient RehabilitationTeam Conference and Plan of Care Update Date: 04/04/2016   Time: 2:20 PM    Patient Name: [name]      Medical Record Number: [id number]  Date of Birth: February 12, 1956 Sex: Male         Room/Bed: 4M03C/4M03C-01 Payor Info: Payor: MEDICARE / Plan: MEDICARE PART A AND B / Product Type: *No Product type* /    Admitting Diagnosis: CVA  Admit Date/Time:  04/03/2016  4:08 PM Admission Comments: No comment available   Primary Diagnosis:  <principal problem not specified> Principal Problem: <principal problem not specified>  Patient Active Problem List   Diagnosis Date Noted  . Renovascular hypertension   . History of DVT (deep vein thrombosis)   . Coronary artery disease involving native coronary artery of native heart without angina pectoris   . Morbid obesity ([place])   . Stage 2 chronic kidney disease   . Hypokalemia   . Acute blood loss anemia   . Embolic stroke involving posterior cerebral artery ([place]) 04/03/2016  . Brainstem stroke ([place]) 04/01/2016  . Abnormality of gait and mobility   . Cerebral infarction due to thrombosis of other cerebral artery ([place])   . Right arm numbness   . Left sided numbness 03/29/2016  . TIA (transient ischemic attack) 03/29/2016  . Hypertensive urgency 03/29/2016  . Sepsis ([place]) 09/10/2013  . DVT (deep venous thrombosis) ([place]) 01/01/2013  . Rash and nonspecific skin eruption 12/29/2012  . Cellulitis and abscess of leg 12/29/2012  . Diabetes type 2, uncontrolled ([place]) 12/29/2012  . Hypertension   . Cardiomyopathy ([place])   . CHF (congestive heart failure) ([place])   . Peripheral neuropathy ([place])   . Special screening for malignant neoplasms, colon 12/09/2012    Expected Discharge Date:    Team Members Present: Physician leading conference: Dr. Delice Lesch Social Worker Present: Ovidio Kin, LCSW Nurse Present: Heather Roberts, RN PT Present: Carney Living, PT OT Present: Cherylynn Ridges, OT SLP Present: Other (comment) (Happi  Overton-SP) PPS Coordinator present : Daiva Nakayama, RN, CRRN     Current Status/Progress Goal Weekly Team Focus  Medical   Right hemiparesis and functional deficits secondary to left paramedian pontine infarct  Improve mobility, cogntion, safety, comorbidities  See above   Bowel/Bladder        eval pending     Swallow/Nutrition/ Hydration             ADL's   Max+2 overall,   Min A overall  pt/family ed, attention, awareness,    Mobility   max +2A  min A overall  functional mobility training, RUE/RLE NMR, coordination, postural control, midline reorientation, sitting/standing balance, activity tolerance, pt/fam education   Communication             Safety/Cognition/ Behavioral Observations            Pain        less than 3     Skin        skin issues form diabetes        *See Care Plan and progress notes for long and short-term goals.  Barriers to Discharge: Cognition, safety, HTN, DM, CKD, hypokalemia, ABLA, CHF    Possible Resolutions to Barriers:  Therapies, optimize HTN, DM meds, follow labs and weights    Discharge Planning/Teaching Needs:    Does not have 24 hr care will need to go to a NH upon discharge from rehab     Team Discussion:  New eval-goals overall min assist level.  Currently plus 2 total. Skin issues-RN & MD aware working on. Delayed processing and motor coordination. MD monitoring labs  Revisions to Treatment Plan:  New eval   Continued Need for Acute Rehabilitation Level of Care: The patient requires daily medical management by a physician with specialized training in physical medicine and rehabilitation for the following conditions: Daily direction of a multidisciplinary physical rehabilitation program to ensure safe treatment while eliciting the highest outcome that is of practical value to the patient.: Yes Daily medical management of patient stability for increased activity during participation in an intensive rehabilitation regime.: Yes Daily  analysis of laboratory values and/or radiology reports with any subsequent need for medication adjustment of medical intervention for : Neurological problems;Diabetes problems;Blood pressure problems;Renal problems;Other  Elease Hashimoto 04/05/2016, 8:23 AM

## 2016-04-06 ENCOUNTER — Inpatient Hospital Stay (HOSPITAL_COMMUNITY): Payer: Medicare Other | Admitting: Physical Therapy

## 2016-04-06 ENCOUNTER — Inpatient Hospital Stay (HOSPITAL_COMMUNITY): Payer: Medicare Other | Admitting: Speech Pathology

## 2016-04-06 ENCOUNTER — Inpatient Hospital Stay (HOSPITAL_COMMUNITY): Payer: Medicare Other | Admitting: Occupational Therapy

## 2016-04-06 LAB — URINALYSIS, ROUTINE W REFLEX MICROSCOPIC
Bilirubin Urine: NEGATIVE
Glucose, UA: NEGATIVE mg/dL
Hgb urine dipstick: NEGATIVE
KETONES UR: NEGATIVE mg/dL
Nitrite: NEGATIVE
PH: 5 (ref 5.0–8.0)
Protein, ur: NEGATIVE mg/dL
SPECIFIC GRAVITY, URINE: 1.01 (ref 1.005–1.030)

## 2016-04-06 LAB — BASIC METABOLIC PANEL
ANION GAP: 11 (ref 5–15)
BUN: 33 mg/dL — ABNORMAL HIGH (ref 6–20)
CALCIUM: 9 mg/dL (ref 8.9–10.3)
CO2: 28 mmol/L (ref 22–32)
CREATININE: 1.35 mg/dL — AB (ref 0.61–1.24)
Chloride: 100 mmol/L — ABNORMAL LOW (ref 101–111)
GFR calc Af Amer: >60 mL/min (ref >60)
GFR calc non Af Amer: 56 mL/min — ABNORMAL LOW (ref >60)
GLUCOSE: 85 mg/dL (ref 65–99)
Potassium: 3.3 mmol/L — ABNORMAL LOW (ref 3.5–5.1)
Sodium: 139 mmol/L (ref 135–145)

## 2016-04-06 LAB — GLUCOSE, CAPILLARY
GLUCOSE-CAPILLARY: 133 mg/dL — AB (ref 65–99)
GLUCOSE-CAPILLARY: 189 mg/dL — AB (ref 65–99)
Glucose-Capillary: 115 mg/dL — ABNORMAL HIGH (ref 65–99)
Glucose-Capillary: 110 mg/dL — ABNORMAL HIGH (ref 65–99)

## 2016-04-06 MED ORDER — POTASSIUM CHLORIDE CRYS ER 20 MEQ PO TBCR
20.0000 meq | EXTENDED_RELEASE_TABLET | Freq: Two times a day (BID) | ORAL | Status: DC
  Administered 2016-04-06 – 2016-04-09 (×6): 20 meq via ORAL
  Filled 2016-04-06 (×6): qty 1

## 2016-04-06 NOTE — Progress Notes (Signed)
Occupational Therapy Session Note  Patient Details  Name: [name] MRN: [id number] Date of Birth: 10-Dec-1955  Today's Date: 04/06/2016 OT Individual Time: 1105-1200 OT Individual Time Calculation (min): 55 min    Short Term Goals: Week 1:  OT Short Term Goal 1 (Week 1): Pt will complete LB dressing with max assist of one caregiver OT Short Term Goal 2 (Week 1): Pt will independently place RUE into functional position during self-care tasks to improve awareness of RUE/minimize UE injury OT Short Term Goal 3 (Week 1): Pt will complete sit > stand with mod assist to decrease burden of care with LB dressing/hygiene  Skilled Therapeutic Interventions/Progress Updates:    Pt asleep in wc upon OT arrival, difficulty maintaining alertness throughout OT session. OT wrapped B LE 's with total A to doff shoes and socks prior. Pt brought to therapy gym via wc and incorporated visual scanning L to R with "eye spy" game. Pt required max cues to scan to R with exaggerated head to to scan L 2/2 blindness in L eye. Addressed B UE coordination with ball toss activity, pt required verbal cues to integrate R UE with catch and toss. Improved timing and integration of R UE with repetition. Peg board activity with focus on R UE coordination, trunk control and dynamic reaching. Pt very flat affect and does not attempt to initiate conversation. Will reluctantly answer one word questions. Pt left seated in TIS wc with call bell in reach and needs met.   Therapy Documentation  Precautions:  Precautions Precautions: Fall Restrictions Weight Bearing Restrictions: No Pain: Pain Assessment Pain Assessment: No/denies pain ADL: ADL ADL Comments: Please see functional navigator     See Function Navigator for Current Functional Status.   Therapy/Group: Individual Therapy  Valma Cava 04/06/2016, 11:16 AM

## 2016-04-06 NOTE — Progress Notes (Signed)
Speech Language Pathology Daily Session Note  Patient Details  Name: [name] MRN: [id number] Date of Birth: [birth year]  Today's Date: 04/06/2016 SLP Individual Time: 1030-1100 SLP Individual Time Calculation (min): 30 min  Short Term Goals: Week 1: SLP Short Term Goal 1 (Week 1): Pt will demonstrate sustained attention to basic, familiar tasks for 10 minutes with Mod A cues for redirection.  SLP Short Term Goal 2 (Week 1): Pt will perform medicaiton management tasks with Mod A verbal cues.  SLP Short Term Goal 3 (Week 1): Pt will utilize external memory aids ot recall new, daily information with Mod A verbal cues.  SLP Short Term Goal 4 (Week 1): Pt will demonstrate emergent awareness by identifying 2 cognitive and 2 physical deficits related to CVA wtih Mod A verbal cues.  SLP Short Term Goal 5 (Week 1): Pt will utilize speech intelligibility strategies with Mod A verbal cues to achieve ~>75% intelligibility at the simple conversation level.   Skilled Therapeutic Interventions: Skilled treatment session focused on cognition goals. SLP facilitated session by providing Mod A for sustained attention to basic task for ~10 to 15 minutes. Pt required Mod A verbal cues to perform basic problem solving task. Pt was nonverbal in response to all questions posed by SLP. Pt was returned to room, left upright in wheelchair with quick release belt donned and all needs within reach.      Function:  Eating Eating   Modified Consistency Diet: No Eating Assist Level: Supervision or verbal cues           Cognition Comprehension Comprehension assist level: Understands basic 50 - 74% of the time/ requires cueing 25 - 49% of the time  Expression   Expression assist level: Expresses basic 50 - 74% of the time/requires cueing 25 - 49% of the time. Needs to repeat parts of sentences.  Social Interaction Social Interaction assist level: Interacts appropriately 75 - 89% of the time - Needs redirection  for appropriate language or to initiate interaction.  Problem Solving Problem solving assist level: Solves basic 50 - 74% of the time/requires cueing 25 - 49% of the time  Memory Memory assist level: Recognizes or recalls 50 - 74% of the time/requires cueing 25 - 49% of the time    Pain Pain Assessment Pain Assessment: No/denies pain  Therapy/Group: Individual Therapy  Sherrel Shafer B. Rutherford Nail, M.S., CCC-SLP Speech-Language Pathologist  Runa Whittingham 04/06/2016, 10:51 AM

## 2016-04-06 NOTE — Progress Notes (Signed)
Physical Therapy Session Note  Patient Details  Name: [name] MRN: [id number] Date of Birth: [birth year]  Today's Date: 04/06/2016 PT Individual Time: 0855-1000 and 1300-1345 PT Individual Time Calculation (min): 65 min and 45 min  Short Term Goals: Week 1:  PT Short Term Goal 1 (Week 1): Pt will demonstrate bed rolling Mod A PT Short Term Goal 2 (Week 1): Pt will demonstrate sit-to-stand Max A PT Short Term Goal 3 (Week 1): Pt will demonstrate supine <-> sit transfer Mod A  PT Short Term Goal 4 (Week 1): Pt will demonstrate squat pivot transfer mod A  PT Short Term Goal 5 (Week 1): Pt will ambulate 25 ft Total A x 1 with W/C follow   Skilled Therapeutic Interventions/Progress Updates:  Treatment 1: Patient in bed upon arrival. Transferred to EOB using rail with max A overall for sequencing and technique and sitting balance EOB with cues to hold foot board with LUE to achieve midline progressed to LB dressing total A for threading RLE and mod A for threading LLE, max A sit > stand from EOB with RUE around therapist to pull pants up with assistance. Squat pivot transfer bed > TIS wheelchair to L with max A overall. Patient propelled wheelchair using BLE only for R NMR and coordination with mod faded to max verbal/tactile cues for sequencing RLE x 50 ft, S-min A. Performed 2 trials in standing frame x 12 min + 10 min with focus on maintaining RLE extension in stance, B hip extension to neutral, upright posture, and midline orientation with mirror for visual feedback while incorporating reaching tasks using RUE to challenge coordination and motor planning. Patient c/o unrated buttocks pain, switched out wheelchair cushion for pressure relieving cushion for improved sitting tolerance. Patient left sitting in TIS wheelchair with quick release belt donned, needs in reach and soft call bell in lap.   Treatment 2: Patient in TIS wheelchair upon arrival. Donned shoes total A and patient c/o pain in R  heel, RN notified and present to assess skin. Performed sit <> stands throughout session with RUE around therapist's shoulder and pulling up on L rail or pushing up from L arm rest, mod-max A. Gait training using L rail in hallway with mirror for visual feedback and +2A for close wheelchair follow for safety. Initial ambulation trial limited by patient pushing to R and too far away from wall requiring max-total A x 15 ft. When patient able to stay close to L rail in hallway, ambulated x 20 ft + 30 ft with max A overall to maintain midline orientation and assist to safely place RLE with max verbal/tactile cues for sequencing gait pattern and advancing LUE along rail in hallway. No episodes of RLE extending too far behind patient to safely remain standing this session. Performed squat pivot back to bed from TIS wheelchair to R with max-total A and sit > supine with max A. Patient left semi reclined in bed with soft call bell and needs in reach, bed alarm on, and family member at bedside.   Therapy Documentation Precautions:  Precautions Precautions: Fall Restrictions Weight Bearing Restrictions: No Vital Signs: Seated BP 116/62 after ambulation Pain: Pain Assessment Pain Assessment: No/denies pain   See Function Navigator for Current Functional Status.   Therapy/Group: Individual Therapy  Daryon Remmert, Murray Hodgkins 04/06/2016, 10:22 AM

## 2016-04-06 NOTE — Progress Notes (Signed)
[facility] PHYSICAL MEDICINE & REHABILITATION     PROGRESS NOTE  Subjective/Complaints:  Pt seen sitting up in bed this AM eating breakfast.  He appears more alert this AM with increased processing time.   ROS: Denies CP, SOB, N/V/D.  Objective: Vital Signs: Blood pressure (!) 140/59, pulse 63, temperature 98 F (36.7 C), temperature source Oral, resp. rate 18, height 5\' 9"  (1.753 m), weight 117 kg (257 lb 15 oz), SpO2 94 %. No results found.  Recent Labs  04/04/16 0516  WBC 6.6  HGB 12.6*  HCT 39.2  PLT 247    Recent Labs  04/04/16 0516 04/06/16 0415  NA 142 139  K 3.5 3.3*  CL 103 100*  GLUCOSE 87 85  BUN 20 33*  CREATININE 1.21 1.35*  CALCIUM 9.4 9.0   CBG (last 3)   Recent Labs  04/05/16 1654 04/05/16 2052 04/06/16 0637  GLUCAP 111* 91 115*    Wt Readings from Last 3 Encounters:  04/06/16 117 kg (257 lb 15 oz)  03/28/16 111.1 kg (245 lb)  09/04/13 120.2 kg (265 lb)    Physical Exam:  BP (!) 140/59 (BP Location: Right Arm)   Pulse 63   Temp 98 F (36.7 C) (Oral)   Resp 18   Ht 5\' 9"  (1.753 m)   Wt 117 kg (257 lb 15 oz)   SpO2 94%   BMI 38.09 kg/m  Constitutional: He appears well-developed. Obese. HENT: Normocephalic and atraumatic.  Eyes: EOM are normal. No discharge.  Cardiovascular: RRR.  No JVD. Respiratory: Effort normal and breath sounds normal.  GI: Soft. Bowel sounds are normal.  Musculoskeletal: He exhibits edema >RLE.Marland Kitchen He exhibits no tenderness.  Neurological: He is alert and oriented.  Dysarthria.  Limited vision.  Poor insight and awareness.    Sensory deficits RLE> LLE.   Motor: 4+/5 RUE prox to distal. Ataxia (improving).  RLE: 4/+/5 proximal to distal  LLE 4+ /5 proximal to distal.  LUE/LLE proximal to distal 5/5.  Skin: Dry scaly skin with scabs b/l distal LE R>L. Psychiatric: Flat affect. Slowed behavior, improving  Assessment/Plan: 1. Functional deficits secondary to left paramedian pontine infarct which require 3+  hours per day of interdisciplinary therapy in a comprehensive inpatient rehab setting. Physiatrist is providing close team supervision and 24 hour management of active medical problems listed below. Physiatrist and rehab team continue to assess barriers to discharge/monitor patient progress toward functional and medical goals.  Function:  Bathing Bathing position   Position: Wheelchair/chair at sink  Bathing parts Body parts bathed by patient: Right arm, Chest, Left arm, Abdomen, Left upper leg, Right upper leg Body parts bathed by helper: Right lower leg, Left lower leg, Buttocks, Front perineal area  Bathing assist Assist Level:  (Max A)      Upper Body Dressing/Undressing Upper body dressing   What is the patient wearing?: Pull over shirt/dress     Pull over shirt/dress - Perfomed by patient: Put head through opening Pull over shirt/dress - Perfomed by helper: Pull shirt over trunk, Thread/unthread left sleeve, Thread/unthread right sleeve        Upper body assist Assist Level:  (Max A)      Lower Body Dressing/Undressing Lower body dressing   What is the patient wearing?: Pants, Non-skid slipper socks       Pants- Performed by helper: Thread/unthread left pants leg, Thread/unthread right pants leg, Pull pants up/down   Non-skid slipper socks- Performed by helper: Don/doff right sock, Don/doff left sock  Lower body assist Assist for lower body dressing:  (Total A)      Toileting Toileting Toileting activity did not occur: No continent bowel/bladder event   Toileting steps completed by helper: Adjust clothing prior to toileting, Adjust clothing after toileting, Performs perineal hygiene Toileting Assistive Devices: Other (comment) (urinal)  Toileting assist Assist level: Touching or steadying assistance (Pt.75%)   Transfers Chair/bed transfer   Chair/bed transfer method: Squat pivot Chair/bed transfer assist level: Maximal assist (Pt 25 -  49%/lift and lower) Chair/bed transfer assistive device: Armrests     Locomotion Ambulation     Max distance: 12 ft Assist level: 2 helpers (max A, +2 w/c follow)   Wheelchair   Type: Manual Max wheelchair distance: 100 ft Assist Level: Dependent (Pt equals 0%)  Cognition Comprehension Comprehension assist level: Understands basic 50 - 74% of the time/ requires cueing 25 - 49% of the time  Expression Expression assist level: Expresses basic 50 - 74% of the time/requires cueing 25 - 49% of the time. Needs to repeat parts of sentences.  Social Interaction Social Interaction assist level: Interacts appropriately 50 - 74% of the time - May be physically or verbally inappropriate.  Problem Solving Problem solving assist level: Solves basic 50 - 74% of the time/requires cueing 25 - 49% of the time  Memory Memory assist level: Recognizes or recalls 50 - 74% of the time/requires cueing 25 - 49% of the time    Medical Problem List and Plan: 1.  Right hemiparesis and functional deficits secondary to left paramedian pontine infarct on 1/31  Cont CIR 2. H/o DVT /Anticoagulation: Pharmaceutical: Xarelto 3. Pain Management: N/A.  4. Mood: LCSW to follow for evaluation and support.  5. Neuropsych: This patient is not yet capable of making decisions on his own behalf. 6. Skin/Wound Care: Routine pressure relief measures. Eucerin to b/l LE. 7. Fluids/Electrolytes/Nutrition: Monitor I/Os 8. HTN: Monitor BP.   Continue coreg, cozaar, Bidil and lasix.     Chlorthalidone increased on 2/8  Improving  9. T2DM with retinopathy and neuropathy: Blood sugars poorly controlled with A1C- 11.0. Consult RD to educated on [place]. Monitor BS ac/hs.   Novolog 70/30 adjusted to 55U daily, 30 Qhs  Improving  Monitor with increased mobility 10. CAD/ Acute systolic CHF/NICM: compensated. Check daily weights and monitor for signs of overload. Heart healthy/low salt diet. Will need perfusion study on outpatient  basis per cardiology. Cont meds Filed Weights   04/04/16 1404 04/05/16 0456 04/06/16 0522  Weight: 116.6 kg (256 lb 15.5 oz) 116.8 kg (257 lb 8 oz) 117 kg (257 lb 15 oz)  11. Morbid Obesity: BMI - 36. Encourage appropriate diet, weight loss and exercise.  12. CKD:   Cr. 1.35 on 2/9   Encourage fluids 13. Hypokalemia:   Increased K+ supplement 2/9  K+ 3.3 on 2/9  Cont to monitor 14. ABLA  Hb 12.6 on 2/7  Cont to monitor  LOS (Days) 3 A FACE TO FACE EVALUATION WAS PERFORMED  Jayde Daffin Lorie Phenix 04/06/2016 8:30 AM

## 2016-04-07 ENCOUNTER — Inpatient Hospital Stay (HOSPITAL_COMMUNITY): Payer: Medicare Other | Admitting: Speech Pathology

## 2016-04-07 DIAGNOSIS — T148XXA Other injury of unspecified body region, initial encounter: Secondary | ICD-10-CM | POA: Insufficient documentation

## 2016-04-07 DIAGNOSIS — B961 Klebsiella pneumoniae [K. pneumoniae] as the cause of diseases classified elsewhere: Secondary | ICD-10-CM

## 2016-04-07 DIAGNOSIS — N390 Urinary tract infection, site not specified: Secondary | ICD-10-CM | POA: Diagnosis present

## 2016-04-07 DIAGNOSIS — B9689 Other specified bacterial agents as the cause of diseases classified elsewhere: Secondary | ICD-10-CM | POA: Diagnosis present

## 2016-04-07 LAB — GLUCOSE, CAPILLARY
GLUCOSE-CAPILLARY: 213 mg/dL — AB (ref 65–99)
GLUCOSE-CAPILLARY: 146 mg/dL — AB (ref 65–99)
Glucose-Capillary: 90 mg/dL (ref 65–99)
Glucose-Capillary: 267 mg/dL — ABNORMAL HIGH (ref 65–99)

## 2016-04-07 MED ORDER — NITROFURANTOIN MONOHYD MACRO 100 MG PO CAPS
100.0000 mg | ORAL_CAPSULE | Freq: Two times a day (BID) | ORAL | Status: DC
  Administered 2016-04-07 – 2016-04-09 (×5): 100 mg via ORAL
  Filled 2016-04-07 (×5): qty 1

## 2016-04-07 NOTE — Progress Notes (Signed)
Slight opening in gluteal fold. Foam dressing placed to area. Staff will continue to monitor area for appropriate healing.

## 2016-04-07 NOTE — Progress Notes (Signed)
Patient given suppository for constipation with large BM early this am. Dr. Posey Pronto notified and seen patient's DTI. Prevalon boot ordered. Foam dressing placed to bilateral heels and tegaderm to left third toe. Will monitor.

## 2016-04-07 NOTE — Progress Notes (Signed)
Speech Language Pathology Daily Session Note  Patient Details  Name: [name] MRN: [id number] Date of Birth: Dec 09, 1955  Today's Date: 04/07/2016 SLP Individual Time: [id number] SLP Individual Time Calculation (min): 45 min  Short Term Goals: Week 1: SLP Short Term Goal 1 (Week 1): Pt will demonstrate sustained attention to basic, familiar tasks for 10 minutes with Mod A cues for redirection.  SLP Short Term Goal 2 (Week 1): Pt will perform medicaiton management tasks with Mod A verbal cues.  SLP Short Term Goal 3 (Week 1): Pt will utilize external memory aids ot recall new, daily information with Mod A verbal cues.  SLP Short Term Goal 4 (Week 1): Pt will demonstrate emergent awareness by identifying 2 cognitive and 2 physical deficits related to CVA wtih Mod A verbal cues.  SLP Short Term Goal 5 (Week 1): Pt will utilize speech intelligibility strategies with Mod A verbal cues to achieve ~>75% intelligibility at the simple conversation level.   Skilled Therapeutic Interventions: Skilled treatment session focused on cognition goals. SLP facilitated session by providing Mod A to Max A cues for simple problem solving such as coloring matching patterns. Pt largely nonverbal during session despite Max A verbal cues. Pt may need goals downgraded at progress note. Will continue assessing during session. Daughters continue to plan on discharge to skilled nursing facility, which continues to be appropriate at this time. Pt left in bed with bed alarm on and all needs within reach. Continue per current plan of care.      Function:    Cognition Comprehension Comprehension assist level: Understands basic 50 - 74% of the time/ requires cueing 25 - 49% of the time  Expression   Expression assist level: Expresses basic 50 - 74% of the time/requires cueing 25 - 49% of the time. Needs to repeat parts of sentences.  Social Interaction Social Interaction assist level: Interacts appropriately 75 - 89% of  the time - Needs redirection for appropriate language or to initiate interaction.  Problem Solving Problem solving assist level: Solves basic 50 - 74% of the time/requires cueing 25 - 49% of the time  Memory Memory assist level: Recognizes or recalls 50 - 74% of the time/requires cueing 25 - 49% of the time    Pain    Therapy/Group: Individual Therapy  Daci Stubbe B. Rutherford Nail, M.S., CCC-SLP Speech-Language Pathologist  Mathea Frieling 04/07/2016, 10:54 AM

## 2016-04-07 NOTE — Progress Notes (Signed)
[place] PHYSICAL MEDICINE & REHABILITATION     PROGRESS NOTE  Subjective/Complaints:  Pt laying in bed this AM.  He states he slept well overnight.  Spoke with and evaluated patient with nursing due to [place].  ROS: Denies CP, SOB, N/V/D.  Objective: Vital Signs: Blood pressure (!) 132/48, pulse 64, temperature 97.9 F (36.6 C), temperature source Oral, resp. rate 18, height 5\' 9"  (1.753 m), weight 117 kg (258 lb), SpO2 98 %. No results found. No results for input(s): WBC, HGB, HCT, PLT in the last 72 hours.  Recent Labs  04/06/16 0415  NA 139  K 3.3*  CL 100*  GLUCOSE 85  BUN 33*  CREATININE 1.35*  CALCIUM 9.0   CBG (last 3)   Recent Labs  04/06/16 1711 04/06/16 2107 04/07/16 0651  GLUCAP 189* 110* 90    Wt Readings from Last 3 Encounters:  04/07/16 117 kg (258 lb)  03/28/16 111.1 kg (245 lb)  09/04/13 120.2 kg (265 lb)    Physical Exam:  BP (!) 132/48 (BP Location: Right Arm)   Pulse 64   Temp 97.9 F (36.6 C) (Oral)   Resp 18   Ht 5\' 9"  (1.753 m)   Wt 117 kg (258 lb)   SpO2 98%   BMI 38.10 kg/m  Constitutional: He appears well-developed. Obese. HENT: Normocephalic and atraumatic.  Eyes: EOM are normal. No discharge.  Cardiovascular: RRR.  No JVD. Respiratory: Effort normal and breath sounds normal.  GI: Soft. Bowel sounds are normal.  Musculoskeletal: He exhibits no edema. He exhibits no tenderness.  Neurological: He is alert and oriented.  Dysarthria.  Limited vision.  Poor insight and awareness.    Sensory deficits RLE> LLE.   Motor: 4+/5 RUE prox to distal. Ataxia (improving).  RLE: 4/+/5 proximal to distal  LLE 4+ /5 proximal to distal.  LUE/LLE proximal to distal 5/5.  Skin: Dry scaly skin with scabs b/l distal LE R>L. Right heel DTI, LLE 3rd distal digit blister. Psychiatric: Flat affect. Slowed behavior, improving  Assessment/Plan: 1. Functional deficits secondary to left paramedian pontine infarct which require 3+ hours per day of  interdisciplinary therapy in a comprehensive inpatient rehab setting. Physiatrist is providing close team supervision and 24 hour management of active medical problems listed below. Physiatrist and rehab team continue to assess barriers to discharge/monitor patient progress toward functional and medical goals.  Function:  Bathing Bathing position   Position: Wheelchair/chair at sink  Bathing parts Body parts bathed by patient: Right arm, Chest, Left arm, Abdomen, Left upper leg, Right upper leg Body parts bathed by helper: Right lower leg, Left lower leg, Buttocks, Front perineal area  Bathing assist Assist Level:  (Max A)      Upper Body Dressing/Undressing Upper body dressing   What is the patient wearing?: Pull over shirt/dress     Pull over shirt/dress - Perfomed by patient: Put head through opening Pull over shirt/dress - Perfomed by helper: Pull shirt over trunk, Thread/unthread left sleeve, Thread/unthread right sleeve        Upper body assist Assist Level:  (Max A)      Lower Body Dressing/Undressing Lower body dressing   What is the patient wearing?: Non-skid slipper socks       Pants- Performed by helper: Thread/unthread left pants leg, Thread/unthread right pants leg, Pull pants up/down   Non-skid slipper socks- Performed by helper: Don/doff left sock, Don/doff right sock  Lower body assist Assist for lower body dressing:  (total A)      Toileting Toileting Toileting activity did not occur: No continent bowel/bladder event   Toileting steps completed by helper: Adjust clothing prior to toileting, Adjust clothing after toileting, Performs perineal hygiene Toileting Assistive Devices: Other (comment) (urinal)  Toileting assist Assist level: Touching or steadying assistance (Pt.75%)   Transfers Chair/bed transfer   Chair/bed transfer method: Squat pivot Chair/bed transfer assist level: Maximal assist (Pt 25 - 49%/lift and  lower) Chair/bed transfer assistive device: Armrests     Locomotion Ambulation     Max distance: 30 ft Assist level: 2 helpers (max A, +2 w/c follow)   Wheelchair   Type: Manual (TIS) Max wheelchair distance: 50 ft Assist Level: Touching or steadying assistance (Pt > 75%) (BLE only)  Cognition Comprehension Comprehension assist level: Understands basic 50 - 74% of the time/ requires cueing 25 - 49% of the time  Expression Expression assist level: Expresses basic 50 - 74% of the time/requires cueing 25 - 49% of the time. Needs to repeat parts of sentences.  Social Interaction Social Interaction assist level: Interacts appropriately 75 - 89% of the time - Needs redirection for appropriate language or to initiate interaction.  Problem Solving Problem solving assist level: Solves basic 50 - 74% of the time/requires cueing 25 - 49% of the time  Memory Memory assist level: Recognizes or recalls 50 - 74% of the time/requires cueing 25 - 49% of the time    Medical Problem List and Plan: 1.  Right hemiparesis and functional deficits secondary to left paramedian pontine infarct on 1/31  Cont CIR 2. H/o DVT /Anticoagulation: Pharmaceutical: Xarelto 3. Pain Management: N/A.  4. Mood: LCSW to follow for evaluation and support.  5. Neuropsych: This patient is not yet capable of making decisions on his own behalf. 6. Skin/Wound Care: Routine pressure relief measures. Eucerin to b/l LE.  Prevalon boots ordered for DTI 7. Fluids/Electrolytes/Nutrition: Monitor I/Os 8. HTN: Monitor BP.   Continue coreg, cozaar, Bidil and lasix.     Chlorthalidone increased on 2/8  Controlled 2/10 9. T2DM with retinopathy and neuropathy: Blood sugars poorly controlled with A1C- 11.0. Consult RD to educated on [place]. Monitor BS ac/hs.   Novolog 70/30 adjusted to 55U daily, 30 Qhs  Overall controlled 2/10  Monitor with increased mobility 10. CAD/ Acute systolic CHF/NICM: compensated. Check daily weights and  monitor for signs of overload. Heart healthy/low salt diet. Will need perfusion study on outpatient basis per cardiology. Cont meds Filed Weights   04/05/16 0456 04/06/16 0522 04/07/16 0507  Weight: 116.8 kg (257 lb 8 oz) 117 kg (257 lb 15 oz) 117 kg (258 lb)  11. Morbid Obesity: BMI - 36. Encourage appropriate diet, weight loss and exercise.  12. CKD:   Cr. 1.35 on 2/9   Encourage fluids 13. Hypokalemia:   Increased K+ supplement 2/9  K+ 3.3 on 2/9  Cont to monitor 14. ABLA  Hb 12.6 on 2/7  Cont to monitor 15. Acute lower UTI  UA+, Ucx pending  Empiric macrobid started 2/10-2/16  LOS (Days) 4 A FACE TO FACE EVALUATION WAS PERFORMED  Ankit Lorie Phenix 04/07/2016 7:48 AM

## 2016-04-07 NOTE — Progress Notes (Signed)
Patient's bilateral heel appeared purplish in color. Right foot measures 3x2 purple color in the center and 3x3 total area of redness surrounding the DTI. Left foot measures 2x1 purple area. Left middle toe blister that is intact. Both heels floated on pillows.

## 2016-04-08 ENCOUNTER — Inpatient Hospital Stay (HOSPITAL_COMMUNITY): Payer: Medicare Other | Admitting: Physical Therapy

## 2016-04-08 ENCOUNTER — Inpatient Hospital Stay (HOSPITAL_COMMUNITY): Payer: Medicare Other | Admitting: Occupational Therapy

## 2016-04-08 DIAGNOSIS — I63432 Cerebral infarction due to embolism of left posterior cerebral artery: Secondary | ICD-10-CM

## 2016-04-08 LAB — GLUCOSE, CAPILLARY
GLUCOSE-CAPILLARY: 141 mg/dL — AB (ref 65–99)
GLUCOSE-CAPILLARY: 68 mg/dL (ref 65–99)
Glucose-Capillary: 151 mg/dL — ABNORMAL HIGH (ref 65–99)
Glucose-Capillary: 54 mg/dL — ABNORMAL LOW (ref 65–99)
Glucose-Capillary: 80 mg/dL (ref 65–99)

## 2016-04-08 NOTE — Progress Notes (Signed)
Physical Therapy Session Note  Patient Details  Name: [name] MRN: [id number] Date of Birth: 02-Mar-1955  Today's Date: 04/08/2016 PT Individual Time: 0800-0900 PT Individual Time Calculation (min): 60 min   Short Term Goals: Week 1:  PT Short Term Goal 1 (Week 1): Pt will demonstrate bed rolling Mod A PT Short Term Goal 2 (Week 1): Pt will demonstrate sit-to-stand Max A PT Short Term Goal 3 (Week 1): Pt will demonstrate supine <-> sit transfer Mod A  PT Short Term Goal 4 (Week 1): Pt will demonstrate squat pivot transfer mod A  PT Short Term Goal 5 (Week 1): Pt will ambulate 25 ft Total A x 1 with W/C follow  Skilled Therapeutic Interventions/Progress Updates:  Pt was seen bedside in the am. Pt transferred supine to edge of bed with head of bed elevated, siderails and max A with verbal cues. Pt tolerated edge of bed about 5 minutes with c/s to min A and verbal cues. Pt transferred edge of bed to w/c to R with max A and verbal cues. Pt transported to gym. Pt transferred w/c to edge of mat to L side max A and verbal cues. Pt tolerated edge of mat about 10 minutes with c/s to min A, while on edge of mat worked on reaching with B UEs. Pt transferred edge of mat to w/c to R with max A and verbal cues. Treatment focused on sit to stand from w/c with hemiwalker in preparation for gait. Pt transferred sit to stand x4 with hemiwalker and max A with second person stand by for safety when pt transitioned from arm of w/c to hemiwalker for the first 3 transfers. The last transfer to pt performed with max A x 1. While standing treatment focused on upright posture, midline, and weight shifting in preparation for gait. Pt returned to room following treatment and left sitting up in tilt in space w/c with quick release belt in place and pt's nurse at bedside.   Therapy Documentation Precautions:  Precautions Precautions: Fall Restrictions Weight Bearing Restrictions: No General:   Vital Signs: Therapy  Vitals Pulse Rate: 62 BP: 128/66 Patient Position (if appropriate): Sitting Pain: No c/o pain.   See Function Navigator for Current Functional Status.   Therapy/Group: Individual Therapy  [name] 04/08/2016, 9:05 AM

## 2016-04-08 NOTE — Progress Notes (Signed)
Hypoglycemic Event  CBG: 54  Treatment: 15 GM carbohydrate snack  Symptoms: None  Follow-up CBG: Time: 2141 CBG Result:80  Possible Reasons for Event: Inadequate meal intake  Comments/MD notified:Treated per protocol.    Dionisio David

## 2016-04-08 NOTE — Progress Notes (Signed)
[place] PHYSICAL MEDICINE & REHABILITATION     PROGRESS NOTE  Subjective/Complaints:  Pt seen laying in bed this AM.  He slept well overnight.    ROS: Denies CP, SOB, N/V/D.  Objective: Vital Signs: Blood pressure 105/82, pulse 75, temperature 98.8 F (37.1 C), temperature source Oral, resp. rate 18, height 5\' 9"  (1.753 m), weight 117.6 kg (259 lb 4.2 oz), SpO2 95 %. No results found. No results for input(s): WBC, HGB, HCT, PLT in the last 72 hours.  Recent Labs  04/06/16 0415  NA 139  K 3.3*  CL 100*  GLUCOSE 85  BUN 33*  CREATININE 1.35*  CALCIUM 9.0   CBG (last 3)   Recent Labs  04/07/16 1648 04/07/16 2119 04/08/16 0624  GLUCAP 213* 146* 151*    Wt Readings from Last 3 Encounters:  04/08/16 117.6 kg (259 lb 4.2 oz)  03/28/16 111.1 kg (245 lb)  09/04/13 120.2 kg (265 lb)    Physical Exam:  BP 105/82 (BP Location: Left Arm)   Pulse 75   Temp 98.8 F (37.1 C) (Oral)   Resp 18   Ht 5\' 9"  (1.753 m)   Wt 117.6 kg (259 lb 4.2 oz)   SpO2 95%   BMI 38.29 kg/m  Constitutional: He appears well-developed. Obese. HENT: Normocephalic and atraumatic.  Eyes: EOM are normal. No discharge.  Cardiovascular: RRR.  No JVD. Respiratory: Effort normal and breath sounds normal.  GI: Soft. Bowel sounds are normal.  Musculoskeletal: He exhibits no edema. He exhibits no tenderness.  Neurological: He is alert and oriented.  Dysarthria.  Limited vision.  Poor insight and awareness.    Sensory deficits RLE> LLE.   Motor: 4+/5 RUE prox to distal. Ataxia (improving).  RLE: 4/+/5 proximal to distal  LLE 4+ /5 proximal to distal.  LUE/LLE proximal to distal 5/5.  Skin: Dry scaly skin with scabs b/l distal LE R>L. Right heel DTI, LLE 3rd distal digit blister. Psychiatric: Flat affect. Slowed behavior, stable  Assessment/Plan: 1. Functional deficits secondary to left paramedian pontine infarct which require 3+ hours per day of interdisciplinary therapy in a comprehensive  inpatient rehab setting. Physiatrist is providing close team supervision and 24 hour management of active medical problems listed below. Physiatrist and rehab team continue to assess barriers to discharge/monitor patient progress toward functional and medical goals.  Function:  Bathing Bathing position   Position: Wheelchair/chair at sink  Bathing parts Body parts bathed by patient: Right arm, Chest, Left arm, Abdomen, Left upper leg, Right upper leg Body parts bathed by helper: Right lower leg, Left lower leg, Buttocks, Front perineal area  Bathing assist Assist Level:  (Max A)      Upper Body Dressing/Undressing Upper body dressing   What is the patient wearing?: Pull over shirt/dress     Pull over shirt/dress - Perfomed by patient: Put head through opening Pull over shirt/dress - Perfomed by helper: Pull shirt over trunk, Thread/unthread left sleeve, Thread/unthread right sleeve        Upper body assist Assist Level:  (Max A)      Lower Body Dressing/Undressing Lower body dressing   What is the patient wearing?: Non-skid slipper socks       Pants- Performed by helper: Thread/unthread left pants leg, Thread/unthread right pants leg, Pull pants up/down   Non-skid slipper socks- Performed by helper: Don/doff left sock, Don/doff right sock                  Lower body assist  Assist for lower body dressing:  (total A)      Toileting Toileting Toileting activity did not occur: No continent bowel/bladder event   Toileting steps completed by helper: Adjust clothing prior to toileting, Adjust clothing after toileting, Performs perineal hygiene Toileting Assistive Devices: Other (comment) (urinal)  Toileting assist Assist level: Touching or steadying assistance (Pt.75%)   Transfers Chair/bed transfer   Chair/bed transfer method: Squat pivot Chair/bed transfer assist level: Maximal assist (Pt 25 - 49%/lift and lower) Chair/bed transfer assistive device: Armrests      Locomotion Ambulation     Max distance: 30 ft Assist level: 2 helpers (max A, +2 w/c follow)   Wheelchair   Type: Manual (TIS) Max wheelchair distance: 50 ft Assist Level: Touching or steadying assistance (Pt > 75%) (BLE only)  Cognition Comprehension Comprehension assist level: Understands basic 50 - 74% of the time/ requires cueing 25 - 49% of the time  Expression Expression assist level: Expresses basic 50 - 74% of the time/requires cueing 25 - 49% of the time. Needs to repeat parts of sentences.  Social Interaction Social Interaction assist level: Interacts appropriately 75 - 89% of the time - Needs redirection for appropriate language or to initiate interaction.  Problem Solving Problem solving assist level: Solves basic 50 - 74% of the time/requires cueing 25 - 49% of the time  Memory Memory assist level: Recognizes or recalls 50 - 74% of the time/requires cueing 25 - 49% of the time    Medical Problem List and Plan: 1.  Right hemiparesis and functional deficits secondary to left paramedian pontine infarct on 1/31  Cont CIR 2. H/o DVT /Anticoagulation: Pharmaceutical: Xarelto 3. Pain Management: N/A.  4. Mood: LCSW to follow for evaluation and support.  5. Neuropsych: This patient is not yet capable of making decisions on his own behalf. 6. Skin/Wound Care: Routine pressure relief measures. Eucerin to b/l LE.  Prevalon boots ordered for DTI 7. Fluids/Electrolytes/Nutrition: Monitor I/Os 8. HTN: Monitor BP.   Continue coreg, cozaar, Bidil and lasix.     Chlorthalidone increased on 2/8  Labile recently, but overall controlled 2/11 9. T2DM with retinopathy and neuropathy: Blood sugars poorly controlled with A1C- 11.0. Consult RD to educated on [place]. Monitor BS ac/hs.   Novolog 70/30 adjusted to 55U daily, 30 Qhs  Elevated 2/11, may need dose adjustment tomorrow  Monitor with increased mobility 10. CAD/ Acute systolic CHF/NICM: compensated. Check daily weights and  monitor for signs of overload. Heart healthy/low salt diet. Will need perfusion study on outpatient basis per cardiology. Cont meds Filed Weights   04/06/16 0522 04/07/16 0507 04/08/16 0500  Weight: 117 kg (257 lb 15 oz) 117 kg (258 lb) 117.6 kg (259 lb 4.2 oz)  11. Morbid Obesity: BMI - 36. Encourage appropriate diet, weight loss and exercise.  12. CKD:   Cr. 1.35 on 2/9   Encourage fluids  Labs ordered for tomorrow 13. Hypokalemia:   Increased K+ supplement 2/9  K+ 3.3 on 2/9  Cont to monitor  Labs ordered for tomorrow 14. ABLA  Hb 12.6 on 2/7  Cont to monitor  Labs ordered for tomorrow 15. Acute lower UTI  UA+, Ucx remains pending  Empiric macrobid started 2/10-2/16  LOS (Days) 5 A FACE TO FACE EVALUATION WAS PERFORMED  Lilee Aldea Lorie Phenix 04/08/2016 8:17 AM

## 2016-04-08 NOTE — Progress Notes (Signed)
Occupational Therapy Session Note  Patient Details  Name: [name] MRN: [id number] Date of Birth: [birth year]  Today's Date: 04/08/2016 OT Individual Time: [id number] and [id number] OT Individual Time Calculation (min): 61 min and 51 min   Short Term Goals: Week 1:  OT Short Term Goal 1 (Week 1): Pt will complete LB dressing with max assist of one caregiver OT Short Term Goal 2 (Week 1): Pt will independently place RUE into functional position during self-care tasks to improve awareness of RUE/minimize UE injury OT Short Term Goal 3 (Week 1): Pt will complete sit > stand with mod assist to decrease burden of care with LB dressing/hygiene  Skilled Therapeutic Interventions/Progress Updates: Pt was sitting in TIS w/c at time of arrival with safety belt donned, pts hips 3/4 way off of cushion. 3 helpers required to reposition pt back in chair. Tx focus on ADL retraining, Rt inattention, R NMR, midline orientation, attention, sequencing and safety awareness. Pt completed BADLs w/c level at sink with 2 helpers required for safety. Cues provided for attending to right side, integrating R UE into washing, and for maintaining upright alignment. Cues for safe hand placement during sit<stand as well as for attending to mirror for visualbiofeedback regarding postural alignment. Sit<stand at sink completed with Max A and right knee blocked. Pt exhibits strong right pushing tendencies. With cues, pt able to scoot back in chair with bilateral UEs and min guard. ACE wraps applied to LEs. Pt was then returned to bed for safety in [place] lift. He was repositioned for comfort in bed with 2 helpers, LEs elevated. He was left with nurse tech and bed alarm on at time of departure.   2nd Session 1:1 tx (51 min) Pt was lying in bed at time of arrival with lunch Johnson on table. Pt agreeable (with encouragement) to get OOB and eat. Supine<sit completed with Total A and max cues on hand placement. Pt ate lunch with tx focus  on unsupported sitting balance, Rt inattention, R NMR, and midline orientation. Manual cues/visual biofeedback utilized for correcting right lean. Max cues for R UE use, had pt complete bilaterally involved set up tasks for forced use. Pt then incontinent of urine and soaked bed. Pt transferred to [facility] with 2 helpers for donning new brief/pants and changing bedsheets. Mirror used for pt to self correct posture in [place] with mod cues, R UE placed on bar for weightbearing. Pt was then transferred to recliner and repositioned for comfort with safety belt donned. He was left with LEs elevated and soft touch call bell in lap at time of departure.      Therapy Documentation Precautions:  Precautions Precautions: Fall Restrictions Weight Bearing Restrictions: No  Pain: No c/o or expressions of pain during sessions    ADL: ADL ADL Comments: Please see functional navigator    See Function Navigator for Current Functional Status.   Therapy/Group: Individual Therapy  Kym Fenter A Shandrea Lusk 04/08/2016, 2:06 PM

## 2016-04-08 NOTE — Progress Notes (Signed)
Physical Therapy Session Note  Patient Details  Name: [name] MRN: [id number] Date of Birth: [birth year]  Today's Date: 04/08/2016 PT Individual Time: [id number] PT Individual Time Calculation (min): 30 min   Short Term Goals: Week 1:  PT Short Term Goal 1 (Week 1): Pt will demonstrate bed rolling Mod A PT Short Term Goal 2 (Week 1): Pt will demonstrate sit-to-stand Max A PT Short Term Goal 3 (Week 1): Pt will demonstrate supine <-> sit transfer Mod A  PT Short Term Goal 4 (Week 1): Pt will demonstrate squat pivot transfer mod A  PT Short Term Goal 5 (Week 1): Pt will ambulate 25 ft Total A x 1 with W/C follow  Skilled Therapeutic Interventions/Progress Updates:  Pt was seen bedside in the pm with nurse at bedside. Pt sliding out of recliner chair. Pt repositioned in chair with +2 mod A and verbal cues. Pt fatigued and had difficulty with standing this pm. Pt stood with max A and verbal cues however only tolerated standing about 10 seconds with R lateral lean. Pt transferred recliner to edge of bed squat pivot to R with max A and verbal cues. Attempted to work on sitting balance on edge of bed however pt to fatigued. Pt transferred edge of bed to supine with mod A and verbal cues. Treatment focused on AAROM B LEs 2 sets x 10 reps each, heel slides, hip abd/add, and SAQs. Pt left sitting up in bed with call bell within reach.   Therapy Documentation Precautions:  Precautions Precautions: Fall Restrictions Weight Bearing Restrictions: No General:   Pain: No c/o pain.   See Function Navigator for Current Functional Status.   Therapy/Group: Individual Therapy  [name] 04/08/2016, 3:24 PM

## 2016-04-09 ENCOUNTER — Inpatient Hospital Stay (HOSPITAL_COMMUNITY): Payer: Medicare Other | Admitting: Speech Pathology

## 2016-04-09 ENCOUNTER — Inpatient Hospital Stay (HOSPITAL_COMMUNITY): Payer: Medicare Other | Admitting: Physical Therapy

## 2016-04-09 ENCOUNTER — Inpatient Hospital Stay (HOSPITAL_COMMUNITY): Payer: Medicare Other

## 2016-04-09 ENCOUNTER — Inpatient Hospital Stay (HOSPITAL_COMMUNITY): Payer: Medicare Other | Admitting: Occupational Therapy

## 2016-04-09 DIAGNOSIS — E160 Drug-induced hypoglycemia without coma: Secondary | ICD-10-CM | POA: Insufficient documentation

## 2016-04-09 DIAGNOSIS — R7309 Other abnormal glucose: Secondary | ICD-10-CM | POA: Insufficient documentation

## 2016-04-09 DIAGNOSIS — T383X5A Adverse effect of insulin and oral hypoglycemic [antidiabetic] drugs, initial encounter: Secondary | ICD-10-CM

## 2016-04-09 LAB — URINE CULTURE: Culture: >=100000 — AB

## 2016-04-09 LAB — GLUCOSE, CAPILLARY
GLUCOSE-CAPILLARY: 209 mg/dL — AB (ref 65–99)
Glucose-Capillary: 156 mg/dL — ABNORMAL HIGH (ref 65–99)
Glucose-Capillary: 251 mg/dL — ABNORMAL HIGH (ref 65–99)
Glucose-Capillary: 243 mg/dL — ABNORMAL HIGH (ref 65–99)

## 2016-04-09 MED ORDER — CHLORTHALIDONE 25 MG PO TABS
12.5000 mg | ORAL_TABLET | Freq: Every day | ORAL | Status: AC
  Administered 2016-04-09: 12.5 mg via ORAL
  Filled 2016-04-09: qty 0.5

## 2016-04-09 MED ORDER — BACLOFEN 5 MG HALF TABLET: 5.0000 mg | ORAL_TABLET | Freq: Two times a day (BID) | ORAL | Status: DC

## 2016-04-09 MED ORDER — CHLORTHALIDONE 50 MG PO TABS
50.0000 mg | ORAL_TABLET | Freq: Every day | ORAL | Status: DC
  Administered 2016-04-10 – 2016-04-11 (×2): 50 mg via ORAL
  Filled 2016-04-09 (×3): qty 1

## 2016-04-09 MED ORDER — BACLOFEN 5 MG HALF TABLET
5.0000 mg | ORAL_TABLET | Freq: Two times a day (BID) | ORAL | Status: DC
  Administered 2016-04-09 – 2016-04-10 (×2): 5 mg via ORAL
  Filled 2016-04-09 (×2): qty 1

## 2016-04-09 MED ORDER — INSULIN ASPART PROT & ASPART (70-30 MIX) 100 UNIT/ML ~~LOC~~ SUSP
30.0000 [IU] | Freq: Every day | SUBCUTANEOUS | Status: DC
  Administered 2016-04-10 – 2016-04-18 (×9): 30 [IU] via SUBCUTANEOUS
  Filled 2016-04-09: qty 10

## 2016-04-09 MED ORDER — CIPROFLOXACIN HCL 500 MG PO TABS
500.0000 mg | ORAL_TABLET | Freq: Two times a day (BID) | ORAL | Status: AC
  Administered 2016-04-09 – 2016-04-15 (×14): 500 mg via ORAL
  Filled 2016-04-09 (×14): qty 1

## 2016-04-09 MED ORDER — POTASSIUM CHLORIDE CRYS ER 20 MEQ PO TBCR
20.0000 meq | EXTENDED_RELEASE_TABLET | Freq: Once | ORAL | Status: AC
  Administered 2016-04-09: 20 meq via ORAL
  Filled 2016-04-09: qty 1

## 2016-04-09 MED ORDER — POTASSIUM CHLORIDE CRYS ER 20 MEQ PO TBCR
20.0000 meq | EXTENDED_RELEASE_TABLET | Freq: Three times a day (TID) | ORAL | Status: DC
  Administered 2016-04-09 – 2016-04-10 (×3): 20 meq via ORAL
  Filled 2016-04-09 (×3): qty 1

## 2016-04-09 NOTE — Plan of Care (Signed)
Problem: RH BOWEL ELIMINATION Goal: RH STG MANAGE BOWEL WITH ASSISTANCE STG Manage Bowel with mod I Assistance.   Outcome: Not Progressing Pt incontinent of bowel with timed toileting attempts

## 2016-04-09 NOTE — Progress Notes (Signed)
Speech Language Pathology Daily Session Note  Patient Details  Name: [name] MRN: [id number] Date of Birth: [birth year]  Today's Date: 04/09/2016 SLP Individual Time: 0830-0930 SLP Individual Time Calculation (min): 60 min  Short Term Goals: Week 1: SLP Short Term Goal 1 (Week 1): Pt will demonstrate sustained attention to basic, familiar tasks for 10 minutes with Mod A cues for redirection.  SLP Short Term Goal 2 (Week 1): Pt will perform medicaiton management tasks with Mod A verbal cues.  SLP Short Term Goal 3 (Week 1): Pt will utilize external memory aids ot recall new, daily information with Mod A verbal cues.  SLP Short Term Goal 4 (Week 1): Pt will demonstrate emergent awareness by identifying 2 cognitive and 2 physical deficits related to CVA wtih Mod A verbal cues.  SLP Short Term Goal 5 (Week 1): Pt will utilize speech intelligibility strategies with Mod A verbal cues to achieve ~>75% intelligibility at the simple conversation level.   Skilled Therapeutic Interventions: Skilled treatment session focused on cognition goals. SLP provided therapy during breakfast tray. Nursing states that pt had been seating with breakfast meal in front of him for ~30 minutes. Pt required Max A verbal cues and more than an extra amount of to initiated self-feeding. Pt required Max A verbal cues for each bite. All environmental distractions reduced however pt distracted by sounds in hall. Pt required max A multimodal cues for sustained attention to task for ~5 minutes. Pt with some purposively behaviors not conducive to therapy (for example, pt able ot answer orientation at the beginning of session with Mod A verbal cues with SLP and nursing but  Smiled and stated incorrect answers at the end of session.) Max education provided on plan of care, participation etc. Pt left in bed, upright with nursing present. Continue per current plan of care.      Function:  Eating Eating   Modified Consistency Diet:  No Eating Assist Level: Supervision or verbal cues;More than reasonable amount of time;Set up assist for (Max verbal cues for initiation to tasks)           Cognition Comprehension Comprehension assist level: Understands basic 50 - 74% of the time/ requires cueing 25 - 49% of the time  Expression   Expression assist level: Expresses basic 25 - 49% of the time/requires cueing 50 - 75% of the time. Uses single words/gestures.  Social Interaction Social Interaction assist level: Interacts appropriately 75 - 89% of the time - Needs redirection for appropriate language or to initiate interaction.  Problem Solving Problem solving assist level: Solves basic 25 - 49% of the time - needs direction more than half the time to initiate, plan or complete simple activities  Memory Memory assist level: Recognizes or recalls 25 - 49% of the time/requires cueing 50 - 75% of the time    Pain Pain Assessment Pain Assessment: No/denies pain  Therapy/Group: Individual Therapy   Jesusa Stenerson B. Rutherford Nail, M.S., CCC-SLP Speech-Language Pathologist   [name] 04/09/2016, 9:29 AM

## 2016-04-09 NOTE — Progress Notes (Signed)
Occupational Therapy Session Note  Patient Details  Name: CINCH RIPPLE MRN: [id number] Date of Birth: Dec 24, 1955  Today's Date: 04/09/2016 OT Individual Time: 1430-1500 OT Individual Time Calculation (min): 30 min    Short Term Goals: Week 1:  OT Short Term Goal 1 (Week 1): Pt will complete LB dressing with max assist of one caregiver OT Short Term Goal 2 (Week 1): Pt will independently place RUE into functional position during self-care tasks to improve awareness of RUE/minimize UE injury OT Short Term Goal 3 (Week 1): Pt will complete sit > stand with mod assist to decrease burden of care with LB dressing/hygiene  Skilled Therapeutic Interventions/Progress Updates:    Upon entering the room, pt seated in tilt in space wheelchair with quick release belt. Pt is sitting on L hand, moaning, and shifting B hips towards edge of chair. Pt not following cues for safety in order for therapist to assist with transfer. Total A stand pivot transfer to the L from wheelchair >bed. Pt needs max A sit >supine. He continues to roll L <> R and reports pain in R hip. OT notifies RN and positions pt's multiple ways with pt finally reporting some relief. Pt closing eyes during much of session and not answering therapist questions in order to better position pt during this session. 3 bed rails up and bed alarm activated upon exiting the room.   Therapy Documentation Precautions:  Precautions Precautions: Fall Restrictions Weight Bearing Restrictions: No General:   Vital Signs: Therapy Vitals Temp: 98.1 F (36.7 C) Temp Source: Oral Pulse Rate: 68 BP: (!) 179/44 Patient Position (if appropriate): Lying Oxygen Therapy SpO2: 96 % O2 Device: Not Delivered Pain: Pain Assessment Pain Assessment: Faces Faces Pain Scale: Hurts whole lot Pain Type: Acute pain Pain Location: Leg Pain Orientation: Right Pain Descriptors / Indicators: Aching;Spasm Pain Intervention(s): Medication (See  eMAR) ADL: ADL ADL Comments: Please see functional navigator  See Function Navigator for Current Functional Status.   Therapy/Group: Individual Therapy  Gypsy Decant 04/09/2016, 3:48 PM

## 2016-04-09 NOTE — Progress Notes (Signed)
[place] PHYSICAL MEDICINE & REHABILITATION     PROGRESS NOTE  Subjective/Complaints:  Pt seen laying in bed this AM.  He slept well overnight.  Spoke to nursing regarding labile CBGs and poor appetite.    ROS: Denies CP, SOB, N/V/D.  Objective: Vital Signs: Blood pressure (!) 160/68, pulse 68, temperature 98.8 F (37.1 C), temperature source Oral, resp. rate 16, height 5\' 9"  (1.753 m), weight 108.3 kg (238 lb 12.1 oz), SpO2 96 %. No results found. No results for input(s): WBC, HGB, HCT, PLT in the last 72 hours. No results for input(s): NA, K, CL, GLUCOSE, BUN, CREATININE, CALCIUM in the last 72 hours.  Invalid input(s): CO CBG (last 3)   Recent Labs  04/08/16 2105 04/08/16 2141 04/09/16 0620  GLUCAP 54* 80 156*    Wt Readings from Last 3 Encounters:  04/09/16 108.3 kg (238 lb 12.1 oz)  03/28/16 111.1 kg (245 lb)  09/04/13 120.2 kg (265 lb)    Physical Exam:  BP (!) 160/68 (BP Location: Right Arm)   Pulse 68   Temp 98.8 F (37.1 C) (Oral)   Resp 16   Ht 5\' 9"  (1.753 m)   Wt 108.3 kg (238 lb 12.1 oz)   SpO2 96%   BMI 35.26 kg/m  Constitutional: He appears well-developed. Obese. HENT: Normocephalic and atraumatic.  Eyes: EOM are normal. No discharge.  Cardiovascular: RRR.  No JVD. Respiratory: Effort nromal and breath sounds normal.  GI: Soft. Bowel sounds are normal.  Musculoskeletal: He exhibits no edema. He exhibits no tenderness.  Neurological: He is alert and oriented.  Dysarthria.  Limited vision.  Poor insight and awareness.    Motor: 4+/5 RUE prox to distal. Ataxia (stable).  RLE: 4/+/5 proximal to distal  LLE 4+ /5 proximal to distal.  LUE/LLE proximal to distal 5/5.  Skin: Dry scaly skin with scabs b/l distal LE R>L. Right heel DTI, LLE 3rd distal digit blister. Psychiatric: Flat affect. Slowed behavior.  Assessment/Plan: 1. Functional deficits secondary to left paramedian pontine infarct which require 3+ hours per day of interdisciplinary  therapy in a comprehensive inpatient rehab setting. Physiatrist is providing close team supervision and 24 hour management of active medical problems listed below. Physiatrist and rehab team continue to assess barriers to discharge/monitor patient progress toward functional and medical goals.  Function:  Bathing Bathing position   Position: Wheelchair/chair at sink  Bathing parts Body parts bathed by patient: Right arm, Chest, Abdomen, Front perineal area, Right upper leg, Left upper leg Body parts bathed by helper: Left arm, Buttocks, Right lower leg, Left lower leg, Back  Bathing assist Assist Level: 2 helpers      Upper Body Dressing/Undressing Upper body dressing   What is the patient wearing?: Pull over shirt/dress     Pull over shirt/dress - Perfomed by patient: Thread/unthread right sleeve, Thread/unthread left sleeve, Put head through opening Pull over shirt/dress - Perfomed by helper: Pull shirt over trunk        Upper body assist Assist Level: Touching or steadying assistance(Pt > 75%)      Lower Body Dressing/Undressing Lower body dressing   What is the patient wearing?: Pants, Non-skid slipper socks, AFO, Shoes       Pants- Performed by helper: Thread/unthread left pants leg, Thread/unthread right pants leg, Pull pants up/down   Non-skid slipper socks- Performed by helper: Don/doff left sock, Don/doff right sock       Shoes - Performed by helper: Don/doff right shoe, Don/doff left shoe, Fasten  right, Fasten left   AFO - Performed by helper: Don/doff right AFO, Don/doff left AFO (ACE wraps)      Lower body assist Assist for lower body dressing: 2 Helpers      Toileting Toileting Toileting activity did not occur: No continent bowel/bladder event   Toileting steps completed by helper: Adjust clothing prior to toileting, Adjust clothing after toileting, Performs perineal hygiene Toileting Assistive Devices: Other (comment) (urinal)  Toileting assist Assist  level: Touching or steadying assistance (Pt.75%)   Transfers Chair/bed transfer   Chair/bed transfer method: Stand pivot Chair/bed transfer assist level: Total assist (Pt < 25%) Chair/bed transfer assistive device: Mechanical lift Mechanical lift: Ecologist     Max distance: 30 ft Assist level: 2 helpers (max A, +2 w/c follow)   Wheelchair   Type: Manual (TIS) Max wheelchair distance: 50 ft Assist Level: Touching or steadying assistance (Pt > 75%) (BLE only)  Cognition Comprehension Comprehension assist level: Understands basic 50 - 74% of the time/ requires cueing 25 - 49% of the time  Expression Expression assist level: Expresses basic 25 - 49% of the time/requires cueing 50 - 75% of the time. Uses single words/gestures.  Social Interaction Social Interaction assist level: Interacts appropriately 75 - 89% of the time - Needs redirection for appropriate language or to initiate interaction.  Problem Solving Problem solving assist level: Solves basic 25 - 49% of the time - needs direction more than half the time to initiate, plan or complete simple activities  Memory Memory assist level: Recognizes or recalls 25 - 49% of the time/requires cueing 50 - 75% of the time    Medical Problem List and Plan: 1.  Right hemiparesis and functional deficits secondary to left paramedian pontine infarct on 1/31  Cont CIR 2. H/o DVT /Anticoagulation: Pharmaceutical: Xarelto 3. Pain Management: N/A.  4. Mood: LCSW to follow for evaluation and support.  5. Neuropsych: This patient is not yet capable of making decisions on his own behalf. 6. Skin/Wound Care: Routine pressure relief measures. Eucerin to b/l LE.  Prevalon boots ordered for DTI 7. Fluids/Electrolytes/Nutrition: Monitor I/Os  Intake has been variable affecting CBGs 8. HTN: Monitor BP.   Continue coreg, cozaar, Bidil and lasix.     Chlorthalidone increased on 2/8, again on 2/12 9. T2DM with retinopathy and  neuropathy: Blood sugars poorly controlled with A1C- 11.0. Consult RD to educated on [place]. Monitor BS ac/hs.   Novolog 70/30 decreased to 30 daily  Novolog 70/30 30 Qhs  Extremely labile with hypoglycemia at night,   Monitor with increased mobility 10. CAD/ Acute systolic CHF/NICM: compensated. Check daily weights and monitor for signs of overload. Heart healthy/low salt diet. Will need perfusion study on outpatient basis per cardiology. Cont meds Filed Weights   04/07/16 0507 04/08/16 0500 04/09/16 0451  Weight: 117 kg (258 lb) 117.6 kg (259 lb 4.2 oz) 108.3 kg (238 lb 12.1 oz)  11. Morbid Obesity: BMI - 36. Encourage appropriate diet, weight loss and exercise.  12. CKD:   Cr. 1.35 on 2/9   Encourage fluids  Labs pending  13. Hypokalemia:   Increased K+ supplement 2/9  K+ 3.3 on 2/9  Cont to monitor  Labs pending 14. ABLA  Hb 12.6 on 2/7  Cont to monitor  Labs pending  15. Acute lower UTI  UA+, Ucx with Pseudomonas and enterococcus  Spoke with pharmacy, macrobid changed to Cipro 2/12-2/18  LOS (Days) 6 A FACE TO FACE EVALUATION WAS PERFORMED  Ankit Lorie Phenix 04/09/2016 8:53 AM

## 2016-04-09 NOTE — Progress Notes (Signed)
Physical Therapy Session Note  Patient Details  Name: [name] MRN: [id number] Date of Birth: 11-28-1955  Today's Date: 04/09/2016 PT Individual Time: 1300-1400 PT Individual Time Calculation (min): 60 min   Short Term Goals: Week 1:  PT Short Term Goal 1 (Week 1): Pt will demonstrate bed rolling Mod A PT Short Term Goal 2 (Week 1): Pt will demonstrate sit-to-stand Max A PT Short Term Goal 3 (Week 1): Pt will demonstrate supine <-> sit transfer Mod A  PT Short Term Goal 4 (Week 1): Pt will demonstrate squat pivot transfer mod A  PT Short Term Goal 5 (Week 1): Pt will ambulate 25 ft Total A x 1 with W/C follow   Skilled Therapeutic Interventions/Progress Updates: Pt received seated in w/c, denies pain and agreeable to treatment. Pt sitting on L hand; reports this is because his bottom was hurting. Transported to/from gym with totalA. Gait 2x10' with maxA for RLE progression, cues for weight shifting to L side, assist for preventing RLE scissoring. Static standing x1 trial between gait trials to focus on weight shift; use of mirror and external cueing to aim L hip for L hand on rail. Squat pivot transfer to L onto mat table with modA. Sitting balance with wedge under R hip for R trunk shortening and facilitation, weight bearing through RLE during rest breaks for NMR, weight bearing through LUE for reducing pusher syndrome with L extremities. Note improvement in pt willingness and initiation of L lean with repetition. Sit <>stand x2 trials from edge of mat table; heavy lean of LEs on table, pushing with LLE, and unwillingness to remove LUE from mat table to come to full stand.When cueing pt to scoot hips back on table for safety, pt demonstrates quick impulsive lean back to lay flat on mat table. Requires totalA to come back up to sitting. External perturbations R/L lateral, rotational at shoulders, anterior/posterior applied with graded force by PT student for focus on core stability. Returned to  w/c with maxA squat pivot to R side. Remained seated in w/c at end of session, quick release belt intact and all needs in reach.      Therapy Documentation Precautions:  Precautions Precautions: Fall Restrictions Weight Bearing Restrictions: No   See Function Navigator for Current Functional Status.   Therapy/Group: Individual Therapy  Luberta Mutter 04/09/2016, 2:17 PM

## 2016-04-09 NOTE — Progress Notes (Signed)
Occupational Therapy Session Note  Patient Details  Name: [name] MRN: [id number] Date of Birth: November 09, 1955  Today's Date: 04/09/2016 OT Individual Time: 1015-1100 OT Individual Time Calculation (min): 45 min    Short Term Goals: Week 1:  OT Short Term Goal 1 (Week 1): Pt will complete LB dressing with max assist of one caregiver OT Short Term Goal 2 (Week 1): Pt will independently place RUE into functional position during self-care tasks to improve awareness of RUE/minimize UE injury OT Short Term Goal 3 (Week 1): Pt will complete sit > stand with mod assist to decrease burden of care with LB dressing/hygiene  Skilled Therapeutic Interventions/Progress Updates:    Pt resting in bed upon arrival.  Pt required max A for bed mobility and squat pivot transfer to w/c.  Pt engaged in BADL retraining including bathing/dressing with sit<>stand from w/c.  Pt required use of STEADY for sit<>stand and standing for LB bathing/dressing tasks.  Pt required mod A for UB bathing tasks and max A for LB bathing tasks.  Pt required tot A for LB dressing tasks.  Pt required max verbal cues for task initiation, sequencing, attention to R, and weight shifts.  Pt remained in TIS w/c with QRB in place.  Focus on activity tolerance, sit<>stand, standing balance, attention to R, task initiation, sequencing, weight shifts, and safety awareness to increase independence with BADLs.   Therapy Documentation Precautions:  Precautions Precautions: Fall Restrictions Weight Bearing Restrictions: No Pain: Pain Assessment Pain Assessment: No/denies pain  See Function Navigator for Current Functional Status.   Therapy/Group: Individual Therapy  Leroy Libman 04/09/2016, 12:16 PM

## 2016-04-10 ENCOUNTER — Inpatient Hospital Stay (HOSPITAL_COMMUNITY): Payer: Medicare Other | Admitting: Physical Therapy

## 2016-04-10 ENCOUNTER — Inpatient Hospital Stay (HOSPITAL_COMMUNITY): Payer: Medicare Other | Admitting: Speech Pathology

## 2016-04-10 ENCOUNTER — Inpatient Hospital Stay (HOSPITAL_COMMUNITY): Payer: Medicare Other | Admitting: Occupational Therapy

## 2016-04-10 LAB — GLUCOSE, CAPILLARY
Glucose-Capillary: 139 mg/dL — ABNORMAL HIGH (ref 65–99)
Glucose-Capillary: 150 mg/dL — ABNORMAL HIGH (ref 65–99)
Glucose-Capillary: 127 mg/dL — ABNORMAL HIGH (ref 65–99)
Glucose-Capillary: 55 mg/dL — ABNORMAL LOW (ref 65–99)
Glucose-Capillary: 93 mg/dL (ref 65–99)

## 2016-04-10 MED ORDER — BACLOFEN 5 MG HALF TABLET: 5.0000 mg | ORAL_TABLET | Freq: Every day | ORAL | Status: DC

## 2016-04-10 MED ORDER — POTASSIUM CHLORIDE CRYS ER 20 MEQ PO TBCR
20.0000 meq | EXTENDED_RELEASE_TABLET | Freq: Two times a day (BID) | ORAL | Status: DC
  Administered 2016-04-11 – 2016-04-13 (×6): 20 meq via ORAL
  Filled 2016-04-10 (×6): qty 1

## 2016-04-10 MED ORDER — LIDOCAINE HCL 2 % EX GEL
CUTANEOUS | Status: DC | PRN
  Filled 2016-04-10: qty 5

## 2016-04-10 MED ORDER — INSULIN ASPART 100 UNIT/ML ~~LOC~~ SOLN: 4.0000 [IU] | Freq: Three times a day (TID) | SUBCUTANEOUS | Status: DC

## 2016-04-10 MED ORDER — HYDROCORTISONE 2.5 % RE CREA
TOPICAL_CREAM | Freq: Two times a day (BID) | RECTAL | Status: DC
  Administered 2016-04-12 – 2016-04-17 (×10): via RECTAL
  Filled 2016-04-10 (×2): qty 28.35

## 2016-04-10 NOTE — Progress Notes (Addendum)
[place] PHYSICAL MEDICINE & REHABILITATION     PROGRESS NOTE  Subjective/Complaints:  Pt seen laying in bed this AM.  He slept well overnight.  He denies complaints.   ROS: Denies CP, SOB, N/V/D.  Objective: Vital Signs: Blood pressure (!) 150/61, pulse (!) 56, temperature 98 F (36.7 C), temperature source Oral, resp. rate 18, height 5\' 9"  (1.753 m), weight 108.8 kg (239 lb 13.8 oz), SpO2 95 %. No results found. No results for input(s): WBC, HGB, HCT, PLT in the last 72 hours. No results for input(s): NA, K, CL, GLUCOSE, BUN, CREATININE, CALCIUM in the last 72 hours.  Invalid input(s): CO CBG (last 3)   Recent Labs  04/09/16 1700 04/09/16 2055 04/10/16 0644  GLUCAP 243* 209* 139*    Wt Readings from Last 3 Encounters:  04/10/16 108.8 kg (239 lb 13.8 oz)  03/28/16 111.1 kg (245 lb)  09/04/13 120.2 kg (265 lb)    Physical Exam:  BP (!) 150/61 (BP Location: Right Arm)   Pulse (!) 56   Temp 98 F (36.7 C) (Oral)   Resp 18   Ht 5\' 9"  (1.753 m)   Wt 108.8 kg (239 lb 13.8 oz)   SpO2 95%   BMI 35.42 kg/m  Constitutional: He appears well-developed. Obese. HENT: Normocephalic and atraumatic.  Eyes: EOM are normal. No discharge.  Cardiovascular: RRR.  No JVD. Respiratory: Effort normal and breath sounds normal.  GI: Soft. Bowel sounds are normal.  Musculoskeletal: He exhibits no edema. He exhibits no tenderness.  Neurological: He is alert and oriented.  Dysarthria.  Limited vision.  Poor insight and awareness.    Motor: 4+/5 RUE prox to distal. Ataxia (unchanged).  RLE: 4/+/5 proximal to distal  LLE 4+ /5 proximal to distal.  LUE/LLE proximal to distal 5/5.  Skin: Dry scaly skin with scabs b/l distal LE R>L. Right heel DTI, LLE 3rd distal digit blister. Psychiatric: Flat affect. Slowed behavior (stable).  Assessment/Plan: 1. Functional deficits secondary to left paramedian pontine infarct which require 3+ hours per day of interdisciplinary therapy in a  comprehensive inpatient rehab setting. Physiatrist is providing close team supervision and 24 hour management of active medical problems listed below. Physiatrist and rehab team continue to assess barriers to discharge/monitor patient progress toward functional and medical goals.  Function:  Bathing Bathing position   Position: Wheelchair/chair at sink  Bathing parts Body parts bathed by patient: Right arm, Chest, Abdomen, Front perineal area, Right upper leg, Left upper leg Body parts bathed by helper: Left arm, Buttocks, Right lower leg, Left lower leg, Back  Bathing assist Assist Level:  (mechanical lift to stand)      Upper Body Dressing/Undressing Upper body dressing   What is the patient wearing?: Pull over shirt/dress     Pull over shirt/dress - Perfomed by patient: Thread/unthread left sleeve, Put head through opening Pull over shirt/dress - Perfomed by helper: Thread/unthread right sleeve, Pull shirt over trunk        Upper body assist Assist Level: Touching or steadying assistance(Pt > 75%)      Lower Body Dressing/Undressing Lower body dressing   What is the patient wearing?: Pants, Ted Hose       Pants- Performed by helper: Thread/unthread left pants leg, Thread/unthread right pants leg, Pull pants up/down   Non-skid slipper socks- Performed by helper: Don/doff right sock, Don/doff left sock       Shoes - Performed by helper: Don/doff right shoe, Don/doff left shoe, Fasten right, Fasten left  AFO - Performed by helper: Don/doff right AFO, Don/doff left AFO (ACE wraps)   TED Hose - Performed by helper: Don/doff right TED hose, Don/doff left TED hose  Lower body assist Assist for lower body dressing:  (STEADY for standing)      Toileting Toileting Toileting activity did not occur: No continent bowel/bladder event   Toileting steps completed by helper: Adjust clothing prior to toileting, Adjust clothing after toileting, Performs perineal hygiene Toileting  Assistive Devices: Other (comment) (urinal)  Toileting assist Assist level: Touching or steadying assistance (Pt.75%)   Transfers Chair/bed transfer   Chair/bed transfer method: Squat pivot Chair/bed transfer assist level: Total assist (Pt < 25%) Chair/bed transfer assistive device: Armrests Mechanical lift: Ecologist     Max distance: 10 Assist level: 2 helpers   Wheelchair   Type: Manual (TIS) Max wheelchair distance: 50 ft Assist Level: Touching or steadying assistance (Pt > 75%) (BLE only)  Cognition Comprehension Comprehension assist level: Understands basic 50 - 74% of the time/ requires cueing 25 - 49% of the time  Expression Expression assist level: Expresses basic 25 - 49% of the time/requires cueing 50 - 75% of the time. Uses single words/gestures.  Social Interaction Social Interaction assist level: Interacts appropriately 75 - 89% of the time - Needs redirection for appropriate language or to initiate interaction.  Problem Solving Problem solving assist level: Solves basic 25 - 49% of the time - needs direction more than half the time to initiate, plan or complete simple activities  Memory Memory assist level: Recognizes or recalls 25 - 49% of the time/requires cueing 50 - 75% of the time    Medical Problem List and Plan: 1.  Right hemiparesis and functional deficits secondary to left paramedian pontine infarct on 1/31  Cont CIR  Baclofen started for spasms 2. H/o DVT /Anticoagulation: Pharmaceutical: Xarelto 3. Pain Management: N/A.  4. Mood: LCSW to follow for evaluation and support.  5. Neuropsych: This patient is not yet capable of making decisions on his own behalf. 6. Skin/Wound Care: Routine pressure relief measures. Eucerin to b/l LE.  Prevalon boots ordered for DTI 7. Fluids/Electrolytes/Nutrition: Monitor I/Os  Intake has been variable affecting CBGs 8. HTN: Monitor BP.   Continue coreg, cozaar, Bidil and lasix.     Chlorthalidone  increased on 2/8, again on 2/12 9. T2DM with retinopathy and neuropathy: Blood sugars poorly controlled with A1C- 11.0. Consult RD to educated on [place]. Monitor BS ac/hs.   Novolog 70/30 decreased to 30 daily  Novolog 70/30 30 Qhs  Remains labile, will add Novalog 4U TID  Monitor with increased mobility 10. CAD/ Acute systolic CHF/NICM: compensated. Check daily weights and monitor for signs of overload. Heart healthy/low salt diet. Will need perfusion study on outpatient basis per cardiology. Cont meds Filed Weights   04/08/16 0500 04/09/16 0451 04/10/16 0602  Weight: 117.6 kg (259 lb 4.2 oz) 108.3 kg (238 lb 12.1 oz) 108.8 kg (239 lb 13.8 oz)  11. Morbid Obesity: BMI - 36. Encourage appropriate diet, weight loss and exercise.  12. CKD:   Cr. 1.35 on 2/9   Encourage fluids  Labs ordered 13. Hypokalemia:   Increased K+ supplement 2/9  K+ 3.3 on 2/9  Cont to monitor  Labs ordered 14. ABLA  Hb 12.6 on 2/7  Cont to monitor  Labs ordered  15. Acute lower UTI  UA+, Ucx with Pseudomonas and enterococcus  Spoke with pharmacy, macrobid changed to Cipro 2/12-2/18  LOS (Days) 7  A FACE TO FACE EVALUATION WAS PERFORMED  Namrata Dangler Lorie Phenix 04/10/2016 9:05 AM

## 2016-04-10 NOTE — Progress Notes (Signed)
Patient with RLE spasms --started baclofen bid but unable to tolerate am dose due to sedation. Will decrease to HS and monitor.

## 2016-04-10 NOTE — Progress Notes (Signed)
Physical Therapy Session Note  Patient Details  Name: [name] MRN: [id number] Date of Birth: 06/12/1955  Today's Date: 04/10/2016 PT Individual Time: 1300-1400 PT Individual Time Calculation (min): 60 min   Short Term Goals: Week 1:  PT Short Term Goal 1 (Week 1): Pt will demonstrate bed rolling Mod A PT Short Term Goal 2 (Week 1): Pt will demonstrate sit-to-stand Max A PT Short Term Goal 3 (Week 1): Pt will demonstrate supine <-> sit transfer Mod A  PT Short Term Goal 4 (Week 1): Pt will demonstrate squat pivot transfer mod A  PT Short Term Goal 5 (Week 1): Pt will ambulate 25 ft Total A x 1 with W/C follow  Skilled Therapeutic Interventions/Progress Updates:   Patient in bed, lying on L hand due to buttock pain, RN and PA-C made aware of pain and increased lethargy this session. Performed bed mobility using rail including rolling for pressure relief with max A overall and max multimodal cues for sequencing. Performed squat pivot transfers bed <> wheelchair to L and R and wheelchair <> mat table to L and R with mod-max A overall. Gait training using L rail in hallway with mod-max A overall and +2 for w/c follow, 2 trials of 15 ft each with max verbal/tactile cues for sequencing gait pattern and advancing LUE along rail and manual placement RLE. Sitting balance EOM with close supervision and mirror for visual feedback to promote midline orientation due to lateral lean to L with eyes closed. Sit <> stand from EOM with RUE on RW and cues to push up/reach back with LUE, max A overall. Standing balance using RW with max A due to lateral lean to R and anterior. Standing on wedge worked on hip extension to neutral and upright posture with midline orientation and max multimodal cues for R knee extension in stance via 3 musketeers technique for BUE support. Reassessed standing balance using RW with improved hip extension to neutral but continued lateral lean to R with RLE flexed in stance. Standing  trials limited by fatigue during session. Patient requested to return to bed, transferred sit> supine with mod A for lifting BLE on bed and left semi reclined with bed alarm on and needs in reach.  Therapy Documentation Precautions:  Precautions Precautions: Fall Restrictions Weight Bearing Restrictions: No Pain: Pain Assessment Pain Assessment: Faces Faces Pain Scale: Hurts whole lot Pain Type: Acute pain Pain Location: Buttocks Pain Orientation: Right;Left Pain Descriptors / Indicators: Aching;Spasm;Discomfort Pain Onset: On-going Pain Intervention(s): RN made aware;Repositioned;Ambulation/increased activity   See Function Navigator for Current Functional Status.   Therapy/Group: Individual Therapy  Lennox Dolberry, Murray Hodgkins 04/10/2016, 2:40 PM

## 2016-04-10 NOTE — Plan of Care (Signed)
Problem: RH SKIN INTEGRITY Goal: RH STG SKIN FREE OF INFECTION/BREAKDOWN Skin Free of Infection/Breakdown with min assist  Outcome: Not Progressing Deep tissue injuries to heels

## 2016-04-10 NOTE — Progress Notes (Signed)
Speech Language Pathology Daily Session Note  Patient Details  Name: [name] MRN: [id number] Date of Birth: 1955-08-22  Today's Date: 04/10/2016 SLP Individual Time: 0930-1015 SLP Individual Time Calculation (min): 45 min  Short Term Goals: Week 1: SLP Short Term Goal 1 (Week 1): Pt will demonstrate sustained attention to basic, familiar tasks for 10 minutes with Mod A cues for redirection.  SLP Short Term Goal 2 (Week 1): Pt will perform medicaiton management tasks with Mod A verbal cues.  SLP Short Term Goal 3 (Week 1): Pt will utilize external memory aids ot recall new, daily information with Mod A verbal cues.  SLP Short Term Goal 4 (Week 1): Pt will demonstrate emergent awareness by identifying 2 cognitive and 2 physical deficits related to CVA wtih Mod A verbal cues.  SLP Short Term Goal 5 (Week 1): Pt will utilize speech intelligibility strategies with Mod A verbal cues to achieve ~>75% intelligibility at the simple conversation level.   Skilled Therapeutic Interventions: Skilled treatment session focused on cognition goals. SLP facilitated the session by providing MAX multimodal cues for all activities in session. Pt frequently closed eyes and turned head away from any therapist when spoken too. Max education provided on plan of care and need for increased participation. Pt able to answer his cell phone when it rang and spoke intelligibility but kept speech to minimal during session. Pt was returned to room, left upright in wheelchair at the nursing station because room was being cleaned. Continue per current plan of care.      Function:    Cognition Comprehension Comprehension assist level: Understands basic 50 - 74% of the time/ requires cueing 25 - 49% of the time  Expression   Expression assist level: Expresses basic 25 - 49% of the time/requires cueing 50 - 75% of the time. Uses single words/gestures.  Social Interaction Social Interaction assist level: Interacts  appropriately 75 - 89% of the time - Needs redirection for appropriate language or to initiate interaction.  Problem Solving Problem solving assist level: Solves basic 25 - 49% of the time - needs direction more than half the time to initiate, plan or complete simple activities  Memory Memory assist level: Recognizes or recalls 25 - 49% of the time/requires cueing 50 - 75% of the time    Pain    Therapy/Group: Individual Therapy  Zayvon Alicea 04/10/2016, 12:25 PM

## 2016-04-10 NOTE — Progress Notes (Signed)
Pt unable to void since condom cath removed this morning. Abdomen distended with pt having no urge to void. Bladder scan 694cc with I&O cath 700cc. Algis Liming, PA notified.

## 2016-04-10 NOTE — Plan of Care (Signed)
Problem: RH KNOWLEDGE DEFICIT Goal: RH STG INCREASE KNOWLEDGE OF DIABETES Caregiver will be able to know the importance of medication management, diet, and normal ranges of blood sugars. Caregiver will be able to teach back and demonstrate proper use of insulin management with minimal assist. Outcome: Not Progressing Caregiver/family has not available

## 2016-04-10 NOTE — Plan of Care (Signed)
Problem: RH COGNITION-NURSING Goal: RH STG ANTICIPATES NEEDS/CALLS FOR ASSIST W/ASSIST/CUES STG Anticipates Needs/Calls for Assist With mod  Assistance/Cues.   Outcome: Not Progressing Poor initiation and awareness

## 2016-04-10 NOTE — Progress Notes (Signed)
Occupational Therapy Weekly Progress Note  Patient Details  Name: [name] MRN: [id number] Date of Birth: 20-Sep-1955  Beginning of progress report period: April 03, 2016 End of progress report period: April 10, 2016  Today's Date: 04/10/2016   Session 1 OT Individual Time: 0900-1000 OT Individual Time Calculation (min): 60 min  Session 2 OT Individual Time: 1415-1445 OT Individual Time Calculation (min): 30 min    Patient has met 1 of 3 short term goals.  Pt is making slow progress with OT goals at this time 2/2 lethargy and attention/awareness deficits. Pt is able to transfer consistently sit<>stand with stedy lift, but has difficulty with transitional movements to assist with transfers. Pt continues to require max/total A for LB ADLs but is progressing with functional use of R Ue. Continue working towards OT goals.    Patient continues to demonstrate the following deficits: muscle weakness, impaired timing and sequencing, unbalanced muscle activation, ataxia, decreased coordination and decreased motor planning, decreased visual perceptual skills, decreased midline orientation and decreased attention to right, decreased initiation, decreased attention, decreased awareness, decreased problem solving, decreased safety awareness, decreased memory and delayed processing and decreased sitting balance, decreased standing balance, decreased postural control and decreased balance strategies and therefore will continue to benefit from skilled OT intervention to enhance overall performance with BADL and Reduce care partner burden.  Patient progressing toward long term goals..  Continue plan of care.  OT Short Term Goals Week 1:  OT Short Term Goal 1 (Week 1): Pt will complete LB dressing with max assist of one caregiver OT Short Term Goal 2 (Week 1): Pt will independently place RUE into functional position during self-care tasks to improve awareness of RUE/minimize UE injury OT Short Term  Goal 3 (Week 1): Pt will complete sit > stand with mod assist to decrease burden of care with LB dressing/hygiene Week 2:     Skilled Therapeutic Interventions/Progress Updates:  Session 1   1:1 OT session focused on improved sit<>stand, R attention, modified bathing/dressing, and functional use of R Ue. B LE ace wrapping completed for edema management w/ pt supine in bed. Max A sup<>sit. Stedy used for transfer to w/c and sit<>stand at the sink w/ mod A. Utilized mirror feedback in standing to facilitate midline posture 2/2 lateral lean to R. Mod A UB bathing/derssing tasks, Max/total A for LB bathing/dressing tasks. Max multimodal cues to integrated R UE initially in bathing tasks and Max cues throughout session to attend to R and maintain open eyes.Pt did initiate conversation today asking "what time isit" Pt left seated in TIS w/c with safety belt on and needs met.   Session 2 1:1 OT session focused on sitting balance, functional use of R Ue, and bed mobility. Sup<>sit w/ Max A. Manual facilitation to facilitate midline posture. Functional use of R UE with reaching activity to R, then cross midline to place cup. Focus on normal movement patterns, hand-eye coordination, and functional grasp. Pt returned to bed at end of session, bed alarm on, RN notified of pt status. Therapy Documentation Precautions:  Precautions Precautions: Fall Restrictions Weight Bearing Restrictions: No Pain: Pain Assessment Pain Assessment: Faces Faces Pain Scale: Hurts whole lot Pain Type: Acute pain Pain Location: Buttocks Pain Orientation: Right;Left Pain Descriptors / Indicators: Aching;Spasm;Discomfort Pain Onset: On-going Pain Intervention(s): RN made aware;Repositioned;increased activity ADL: ADL ADL Comments: Please see functional navigator  See Function Navigator for Current Functional Status.   Therapy/Group: Individual Therapy  Valma Cava 04/10/2016, 4:44 PM

## 2016-04-10 NOTE — Progress Notes (Signed)
Hypoglycemic Event  CBG: 55  Treatment: 15 GM carbohydrate snack  Symptoms: None  Follow-up CBG: Time:2204 CBG Result:93  Possible Reasons for Event: Inadequate meal intake and Medication regimen: Insulin needs adjustments.  Comments/MD notified:Treated per protocol.    Dionisio David

## 2016-04-11 ENCOUNTER — Inpatient Hospital Stay (HOSPITAL_COMMUNITY): Payer: Medicare Other | Admitting: Physical Therapy

## 2016-04-11 ENCOUNTER — Inpatient Hospital Stay (HOSPITAL_COMMUNITY): Payer: Medicare Other | Admitting: Occupational Therapy

## 2016-04-11 ENCOUNTER — Inpatient Hospital Stay (HOSPITAL_COMMUNITY): Payer: Medicare Other | Admitting: Speech Pathology

## 2016-04-11 DIAGNOSIS — D72829 Elevated white blood cell count, unspecified: Secondary | ICD-10-CM | POA: Insufficient documentation

## 2016-04-11 LAB — CBC WITH DIFFERENTIAL/PLATELET
Basophils Absolute: 0 10*3/uL (ref 0.0–0.1)
Basophils Relative: 0 %
EOS PCT: 1 %
Eosinophils Absolute: 0.1 10*3/uL (ref 0.0–0.7)
HCT: 39.4 % (ref 39.0–52.0)
Hemoglobin: 12.8 g/dL — ABNORMAL LOW (ref 13.0–17.0)
LYMPHS ABS: 1.8 10*3/uL (ref 0.7–4.0)
Lymphocytes Relative: 16 %
MCH: 28.9 pg (ref 26.0–34.0)
MCHC: 32.5 g/dL (ref 30.0–36.0)
MCV: 88.9 fL (ref 78.0–100.0)
MONO ABS: 1.2 10*3/uL — AB (ref 0.1–1.0)
Monocytes Relative: 10 %
Neutro Abs: 8.7 10*3/uL — ABNORMAL HIGH (ref 1.7–7.7)
Neutrophils Relative %: 73 %
PLATELETS: 306 10*3/uL (ref 150–400)
RBC: 4.43 MIL/uL (ref 4.22–5.81)
RDW: 12.6 % (ref 11.5–15.5)
WBC: 11.9 10*3/uL — ABNORMAL HIGH (ref 4.0–10.5)

## 2016-04-11 LAB — BASIC METABOLIC PANEL
Anion gap: 11 (ref 5–15)
BUN: 43 mg/dL — AB (ref 6–20)
CHLORIDE: 101 mmol/L (ref 101–111)
CO2: 26 mmol/L (ref 22–32)
Calcium: 8.8 mg/dL — ABNORMAL LOW (ref 8.9–10.3)
Creatinine, Ser: 1.51 mg/dL — ABNORMAL HIGH (ref 0.61–1.24)
GFR calc Af Amer: 56 mL/min — ABNORMAL LOW (ref >60)
GFR calc non Af Amer: 49 mL/min — ABNORMAL LOW (ref >60)
GLUCOSE: 180 mg/dL — AB (ref 65–99)
POTASSIUM: 4 mmol/L (ref 3.5–5.1)
Sodium: 138 mmol/L (ref 135–145)

## 2016-04-11 LAB — GLUCOSE, CAPILLARY
GLUCOSE-CAPILLARY: 106 mg/dL — AB (ref 65–99)
Glucose-Capillary: 180 mg/dL — ABNORMAL HIGH (ref 65–99)
Glucose-Capillary: 244 mg/dL — ABNORMAL HIGH (ref 65–99)
Glucose-Capillary: 151 mg/dL — ABNORMAL HIGH (ref 65–99)

## 2016-04-11 MED ORDER — GLUCERNA SHAKE PO LIQD
237.0000 mL | Freq: Three times a day (TID) | ORAL | Status: DC
  Administered 2016-04-11 – 2016-04-25 (×39): 237 mL via ORAL

## 2016-04-11 MED ORDER — INSULIN ASPART 100 UNIT/ML ~~LOC~~ SOLN
2.0000 [IU] | Freq: Three times a day (TID) | SUBCUTANEOUS | Status: DC
  Administered 2016-04-12 – 2016-04-17 (×11): 2 [IU] via SUBCUTANEOUS

## 2016-04-11 MED ORDER — SODIUM CHLORIDE 0.9 % IV SOLN
INTRAVENOUS | Status: DC
  Administered 2016-04-11: 50 mL via INTRAVENOUS

## 2016-04-11 MED ORDER — GLUCERNA PO LIQD: 237.0000 mL | Freq: Three times a day (TID) | ORAL | Status: DC

## 2016-04-11 MED ORDER — LIDOCAINE 5 % EX PTCH
1.0000 | MEDICATED_PATCH | CUTANEOUS | Status: DC
  Administered 2016-04-11 – 2016-04-25 (×14): 1 via TRANSDERMAL
  Filled 2016-04-11 (×14): qty 1

## 2016-04-11 NOTE — Patient Care Conference (Signed)
Inpatient RehabilitationTeam Conference and Plan of Care Update Date: 04/11/2016   Time: 10:45 AM    Patient Name: [name]      Medical Record Number: [id number]  Date of Birth: 1956-02-05 Sex: Male         Room/Bed: 4M03C/4M03C-01 Payor Info: Payor: MEDICARE / Plan: MEDICARE PART A AND B / Product Type: *No Product type* /    Admitting Diagnosis: CVA  Admit Date/Time:  04/03/2016  4:08 PM Admission Comments: No comment available   Primary Diagnosis:  <principal problem not specified> Principal Problem: <principal problem not specified>  Patient Active Problem List   Diagnosis Date Noted  . Low back pain   . Leukocytosis   . Labile blood glucose   . Hypoglycemia due to insulin   . Acute lower UTI   . Deep tissue injury   . Renovascular hypertension   . History of DVT (deep vein thrombosis)   . Coronary artery disease involving native coronary artery of native heart without angina pectoris   . Morbid obesity ([place])   . Stage 2 chronic kidney disease   . Hypokalemia   . Acute blood loss anemia   . Embolic stroke involving posterior cerebral artery ([place]) 04/03/2016  . Brainstem stroke ([place]) 04/01/2016  . Abnormality of gait and mobility   . Cerebral infarction due to thrombosis of other cerebral artery ([place])   . Right arm numbness   . Left sided numbness 03/29/2016  . TIA (transient ischemic attack) 03/29/2016  . Hypertensive urgency 03/29/2016  . Sepsis ([place]) 09/10/2013  . DVT (deep venous thrombosis) ([place]) 01/01/2013  . Rash and nonspecific skin eruption 12/29/2012  . Cellulitis and abscess of leg 12/29/2012  . Diabetes type 2, uncontrolled ([place]) 12/29/2012  . Hypertension   . Cardiomyopathy ([place])   . CHF (congestive heart failure) ([place])   . Peripheral neuropathy ([place])   . Special screening for malignant neoplasms, colon 12/09/2012    Expected Discharge Date:    Team Members Present: Physician leading conference: Dr. Delice Lesch Social Worker Present: Ovidio Kin, LCSW Nurse Present: Heather Roberts, RN PT Present: Carney Living, PT OT Present: Willeen Cass, OT SLP Present: Other (comment) (Happi Overton-SP) PPS Coordinator present : Daiva Nakayama, RN, CRRN     Current Status/Progress Goal Weekly Team Focus  Medical   Right hemiparesis and functional deficits secondary to left paramedian pontine infarct on 1/31  Improve mobility, cognition, safety, HTN, DM, UTI  See above   Bowel/Bladder   incontinent of bladder at times and continent of [place]  toileting him every 2 hours ,help with urinal  assistance with urinal  more often to keep him continent   Swallow/Nutrition/ Hydration             ADL's   Max A overall, Mod A UB dressing  Min A overall  modified bathing/dressing, R NMR, R attention, functional transfers, functional communication, sitting balance, improved sit<>stand   Mobility   max A bed mobility and transfers, S sitting balance, +2 gait for w/c follow  min A overall  functional mobility training, R NMR, coordination, postural control, midline reorientation, sitting > standing balance, activity tolerance, attention   Communication   Mod A  Supervision  continue addressing speech intelligiblity, word finding strategies   Safety/Cognition/ Behavioral Observations  Max A  Min A  focus on sustaine dattention for 10 minutes, following simple 1 step directions, basic problem solving   Pain   less<4  assess and adress it every 4  hours  pain free   Skin   sacrum is red covered by foam, both heels got deep tissue injuries covered by foams,prevalon boots on all the time on he is the bed, air matress bed  monitor every shift   monitor, assess and adress every shift and as need it      *See Care Plan and progress notes for long and short-term goals.  Barriers to Discharge: Cognition, safety, HTN, DM, CKD, UTI    Possible Resolutions to Barriers:  Therapies, optimize HTN, DM meds, follow labs abx     Discharge Planning/Teaching  Needs:  Pt's plan is to go to a NH from here due to has no caregivers. Daughter's will check in and out but work and have families. Daughter to apply for medicaid while pt here.      Team Discussion:  Making progress slowly-starting to refuse and not participate fully-have asked neuro-psych to see question depression. MD adjusting diabetes and HTN meds. Had to I & O cath yesterday due to retaining. UTI being treated for. Pain in bottom-MD made aware and prescribed lidoderm patch and will try air mattress. Push po fluids too dry.   Revisions to Treatment Plan:  Plan for NHP due to no caregivers.   Continued Need for Acute Rehabilitation Level of Care: The patient requires daily medical management by a physician with specialized training in physical medicine and rehabilitation for the following conditions: Daily direction of a multidisciplinary physical rehabilitation program to ensure safe treatment while eliciting the highest outcome that is of practical value to the patient.: Yes Daily medical management of patient stability for increased activity during participation in an intensive rehabilitation regime.: Yes Daily analysis of laboratory values and/or radiology reports with any subsequent need for medication adjustment of medical intervention for : Neurological problems;Diabetes problems;Blood pressure problems;Renal problems;Other  Elease Hashimoto 04/12/2016, 8:37 AM

## 2016-04-11 NOTE — Progress Notes (Signed)
[facility] PHYSICAL MEDICINE & REHABILITATION     PROGRESS NOTE  Subjective/Complaints:  Pt seen sitting up in bed aspleep, eating breakfast. He states he slept well overnight, but is still sleepy.  ROS: Denies CP, SOB, N/V/D.  Objective: Vital Signs: Blood pressure (!) 132/55, pulse 60, temperature 98.7 F (37.1 C), temperature source Oral, resp. rate 18, height 5\' 9"  (1.753 m), weight 107.4 kg (236 lb 12.4 oz), SpO2 97 %. No results found.  Recent Labs  04/11/16 0516  WBC 11.9*  HGB 12.8*  HCT 39.4  PLT 306    Recent Labs  04/11/16 0516  NA 138  K 4.0  CL 101  GLUCOSE 180*  BUN 43*  CREATININE 1.51*  CALCIUM 8.8*   CBG (last 3)   Recent Labs  04/10/16 2121 04/10/16 2204 04/11/16 0626  GLUCAP 55* 93 180*    Wt Readings from Last 3 Encounters:  04/11/16 107.4 kg (236 lb 12.4 oz)  03/28/16 111.1 kg (245 lb)  09/04/13 120.2 kg (265 lb)    Physical Exam:  BP (!) 132/55 (BP Location: Left Arm)   Pulse 60   Temp 98.7 F (37.1 C) (Oral)   Resp 18   Ht 5\' 9"  (1.753 m)   Wt 107.4 kg (236 lb 12.4 oz)   SpO2 97%   BMI 34.97 kg/m  Constitutional: He appears well-developed. Obese. HENT: Normocephalic and atraumatic.  Eyes: EOM are normal. No discharge.  Cardiovascular: RRR.  No JVD. Respiratory: Effort normal and breath sounds normal.  GI: Soft. Bowel sounds are normal.  Musculoskeletal: He exhibits no edema. He exhibits no tenderness.  Neurological: He is alert and oriented.  Dysarthria.  Limited vision.  Poor insight and awareness.    Motor: 4+/5 RUE prox to distal. Ataxia (stable).  RLE: 4/+/5 proximal to distal  LLE 4+ /5 proximal to distal.  LUE/LLE proximal to distal 5/5.  Skin: Dry scaly skin with scabs b/l distal LE R>L. Right heel DTI, LLE 3rd distal digit blister. Psychiatric: Flat affect. Slowed behavior  Assessment/Plan: 1. Functional deficits secondary to left paramedian pontine infarct which require 3+ hours per day of  interdisciplinary therapy in a comprehensive inpatient rehab setting. Physiatrist is providing close team supervision and 24 hour management of active medical problems listed below. Physiatrist and rehab team continue to assess barriers to discharge/monitor patient progress toward functional and medical goals.  Function:  Bathing Bathing position   Position: Wheelchair/chair at sink  Bathing parts Body parts bathed by patient: Right arm, Chest, Abdomen, Right upper leg, Left upper leg Body parts bathed by helper: Left lower leg, Right lower leg, Buttocks, Front perineal area, Left arm  Bathing assist Assist Level:  (Max A)      Upper Body Dressing/Undressing Upper body dressing   What is the patient wearing?: Button up shirt     Pull over shirt/dress - Perfomed by patient: Thread/unthread left sleeve, Put head through opening Pull over shirt/dress - Perfomed by helper: Thread/unthread right sleeve, Pull shirt over trunk Button up shirt - Perfomed by patient: Thread/unthread left sleeve, Thread/unthread right sleeve Button up shirt - Perfomed by helper: Pull shirt around back, Button/unbutton shirt    Upper body assist Assist Level: Touching or steadying assistance(Pt > 75%)      Lower Body Dressing/Undressing Lower body dressing   What is the patient wearing?: Pants       Pants- Performed by helper: Thread/unthread right pants leg, Thread/unthread left pants leg, Pull pants up/down, Fasten/unfasten pants  Non-skid slipper socks- Performed by helper: Don/doff left sock, Don/doff right sock       Shoes - Performed by helper: Don/doff right shoe, Don/doff left shoe, Fasten right, Fasten left   AFO - Performed by helper: Don/doff right AFO, Don/doff left AFO (ACE wraps)   TED Hose - Performed by helper: Don/doff right TED hose, Don/doff left TED hose  Lower body assist Assist for lower body dressing:  (total A)      Toileting Toileting Toileting activity did not occur: No  continent bowel/bladder event   Toileting steps completed by helper: Adjust clothing prior to toileting, Adjust clothing after toileting, Performs perineal hygiene Toileting Assistive Devices: Other (comment) (urinal)  Toileting assist Assist level: Touching or steadying assistance (Pt.75%)   Transfers Chair/bed transfer   Chair/bed transfer method: Squat pivot Chair/bed transfer assist level: Maximal assist (Pt 25 - 49%/lift and lower) Chair/bed transfer assistive device: Armrests Mechanical lift: Stedy   Locomotion Ambulation     Max distance: 15 ft Assist level: 2 helpers   Wheelchair   Type: Manual (TIS) Max wheelchair distance: 50 ft Assist Level: Touching or steadying assistance (Pt > 75%) (BLE only)  Cognition Comprehension Comprehension assist level: Understands basic 50 - 74% of the time/ requires cueing 25 - 49% of the time  Expression Expression assist level: Expresses basic 25 - 49% of the time/requires cueing 50 - 75% of the time. Uses single words/gestures.  Social Interaction Social Interaction assist level: Interacts appropriately 75 - 89% of the time - Needs redirection for appropriate language or to initiate interaction.  Problem Solving Problem solving assist level: Solves basic 25 - 49% of the time - needs direction more than half the time to initiate, plan or complete simple activities  Memory Memory assist level: Recognizes or recalls 25 - 49% of the time/requires cueing 50 - 75% of the time    Medical Problem List and Plan: 1.  Right hemiparesis and functional deficits secondary to left paramedian pontine infarct on 1/31  Cont CIR  Baclofen started for spasms d/ced on 2/14 due to lethargy 2. H/o DVT /Anticoagulation: Pharmaceutical: Xarelto 3. Pain Management: N/A.  4. Mood: LCSW to follow for evaluation and support.  5. Neuropsych: This patient is not yet capable of making decisions on his own behalf. 6. Skin/Wound Care: Routine pressure relief measures.  Eucerin to b/l LE.  Prevalon boots ordered for DTI 7. Fluids/Electrolytes/Nutrition: Monitor I/Os  Intake has been variable affecting CBGs 8. HTN: Monitor BP.   Continue coreg, cozaar, Bidil and lasix.     Chlorthalidone increased on 2/8, again on 2/12  Labile, but appears to be improving, will cont to monitor 9. T2DM with retinopathy and neuropathy: Blood sugars poorly controlled with A1C- 11.0. Consult RD to educated on [place]. Monitor BS ac/hs.   Novolog 70/30 decreased to 30 daily  Novolog 70/30 30 Qhs  Novalog decreased to 2U TID  Extremely labile, with hypoglycemia yesterday.  qHS snack added 2/14  Monitor with increased mobility 10. CAD/ Acute systolic CHF/NICM: compensated. Check daily weights and monitor for signs of overload. Heart healthy/low salt diet. Will need perfusion study on outpatient basis per cardiology. Cont meds Filed Weights   04/09/16 0451 04/10/16 0602 04/11/16 0541  Weight: 108.3 kg (238 lb 12.1 oz) 108.8 kg (239 lb 13.8 oz) 107.4 kg (236 lb 12.4 oz)   Clinically stable 11. Morbid Obesity: BMI - 36. Encourage appropriate diet, weight loss and exercise.  12. CKD:   Cr. 1.51 on  2/14  Encourage fluids  IVF x24 hours ordered 2/14 13. Hypokalemia:   Increased K+ supplement 2/9  K+ 4.0 on 2/14  Cont to monitor 14. ABLA  Hb 12.8 on 2/14  Cont to monitor 15. Acute lower UTI  UA+, Ucx with Pseudomonas and enterococcus  Spoke with pharmacy, macrobid changed to Cipro 2/12-2/18  Likely cause of leukocytosis  LOS (Days) 8 A FACE TO FACE EVALUATION WAS PERFORMED  Ankit Lorie Phenix 04/11/2016 8:55 AM

## 2016-04-11 NOTE — Progress Notes (Signed)
Physical Therapy Session Note  Patient Details  Name: [name] MRN: [place]:[id number] Date of Birth: 12/02/1955  Today's Date: 04/11/2016 PT Individual Time: 1115-1205 and 1530-1608 PT Individual Time Calculation (min): 50 min and 38 min  Short Term Goals: Week 1:  PT Short Term Goal 1 (Week 1): Pt will demonstrate bed rolling Mod A PT Short Term Goal 2 (Week 1): Pt will demonstrate sit-to-stand Max A PT Short Term Goal 3 (Week 1): Pt will demonstrate supine <-> sit transfer Mod A  PT Short Term Goal 4 (Week 1): Pt will demonstrate squat pivot transfer mod A  PT Short Term Goal 5 (Week 1): Pt will ambulate 25 ft Total A x 1 with W/C follow  Skilled Therapeutic Interventions/Progress Updates:   Treatment 1: Patient in [place] wheelchair, handoff from OT. Performed squat pivot transfer wheelchair > mat table to R with mod-max A. Seated EOM, patient demonstrated improved midline orientation this date without lateral lean. Patient sat EOM and consumed 8oz liquid with increased time and supervision. Performed tabletop large peg task using RUE with focus on coordination and control of movement. Performed sit <> stand from mat with min A to achieve standing with RUE on RW and pushing up from mat with LUE but required max A to maintain static standing balance briefly before requiring total A to return to mat due to lateral LOB to L with lean. In sitting, patient leaned back on mat then shouted out in pain due to RLE pain, total A to return to sitting EOM. Patient began vomiting without warning, RN notified, see vitals below. Utilized Stedy to transfer from mat table to TIS wheelchair to bed with min-mod A using BUE on Stedy. Sitting EOB, patient with another episode of vomiting. Eventually patient returned to supine with +2A and left sitting up in bed with needs in reach and bed alarm on.    Treatment 2: Patient in bed upon arrival, in constant motion attempting to reposition due to pain in RLE/buttock.  Difficult to assess nature and location of pain due to minimal verbal expression from patient. Performed rolling in bed to L and R multiple times with hand over hand assist to reach for bed rails and max A overall, with patient demonstrating some relief in L sidelying. Patient unable to maintain L sidelying unsupported and when returning to supine, continued to grimace in pain and placed LUE underneath him, however patient with IV in L forearm which was noted to be leaking, RN notified and reinserted IV. Performed PROM to R/L heel cords, hamstrings, and gross LE flexion with patient hollering in pain with all movement as well as resisting all movement from PT. Patient stating, "Bye!" to therapist and declined further therapy. Patient left supine in bed with needs in reach and bed alarm on.    Therapy Documentation Precautions:  Precautions Precautions: Fall Restrictions Weight Bearing Restrictions: No Vital Signs: Therapy Vitals Pulse Rate: 66 Resp: 18 BP: (!) 157/88 Patient Position (if appropriate): Sitting Oxygen Therapy SpO2: 94 % O2 Device: Not Delivered Pain: Pain Assessment Pain Assessment: Faces Faces Pain Scale: Hurts whole lot Pain Type: Acute pain Pain Location: Leg Pain Orientation: Right Pain Descriptors / Indicators: Aching;Grimacing;Guarding Pain Onset: On-going Pain Intervention(s): Repositioned;RN made aware   See Function Navigator for Current Functional Status.   Therapy/Group: Individual Therapy  Alka Falwell, Murray Hodgkins 04/11/2016, 12:32 PM

## 2016-04-11 NOTE — Progress Notes (Signed)
Occupational Therapy Session Note  Patient Details  Name: FRANCISCA SHIVELY MRN: [id number] Date of Birth: 07-21-1955  Today's Date: 04/11/2016 OT Individual Time: 1100-1115 OT Individual Time Calculation (min): 15 min    Skilled Therapeutic Interventions/Progress Updates: 1:1 Focus on sustained attention to task with mod environmental cues, functional problem solving, dynamic sitting balance at EOB, functional squat pivot transfer into w/c, functional use of bilateral UE for self feeding, and answering personal biographical information with more than reasonable amt of time. Pt required mod a for static to dynamic sitting balance and max A for squat pivot transfers to the right. No c/o pain in this session   2nd session 1:1 14:30-15:00 Pt in bed; pt had undone pants and taken his genitals out of his pants and voided on a towel (did not call for assistance). Focus on bed mobility with bed rails with min to mod A for rolling for clothing and linen change. Pt continue to c/o pain and hold his left hip. Positioned on side in bed before end of session. Pt cathed with NT. Pt with decr verbalizations during this session.      Therapy Documentation Precautions:  Precautions Precautions: Fall Restrictions Weight Bearing Restrictions: No General:   Vital Signs: Therapy Vitals Temp: 98.9 F (37.2 C) Temp Source: Oral Pulse Rate: 70 Resp: 17 BP: 137/90 Patient Position (if appropriate): Lying Oxygen Therapy SpO2: 96 % O2 Device: Not Delivered Pain: Pain Assessment Pain Assessment: Faces Faces Pain Scale: Hurts whole lot Pain Type: Acute pain Pain Location: Leg Pain Orientation: Right Pain Descriptors / Indicators: Aching;Grimacing;Guarding Pain Onset: On-going Pain Intervention(s): Repositioned;RN made aware  See Function Navigator for Current Functional Status.   Therapy/Group: Individual Therapy  Willeen Cass [facility] 04/11/2016, 4:23 PM

## 2016-04-11 NOTE — Progress Notes (Signed)
Speech Language Pathology Weekly Progress and Session Note  Patient Details  Name: [name] MRN: [id number] Date of Birth: 1955/03/21  Beginning of progress report period: April 04, 2016 End of progress report period: April 11, 2016  Today's Date: 04/11/2016 SLP Individual Time: 1030-1100 SLP Individual Time Calculation (min): 30 min  Short Term Goals: Week 1: SLP Short Term Goal 1 (Week 1): Pt will demonstrate sustained attention to basic, familiar tasks for 10 minutes with Mod A cues for redirection.  SLP Short Term Goal 1 - Progress (Week 1): Not met SLP Short Term Goal 2 (Week 1): Pt will perform medicaiton management tasks with Mod A verbal cues.  SLP Short Term Goal 2 - Progress (Week 1): Discontinued (comment) (Not appropriate at this time) SLP Short Term Goal 3 (Week 1): Pt will utilize external memory aids ot recall new, daily information with Mod A verbal cues.  SLP Short Term Goal 3 - Progress (Week 1): Discontinued (comment) (Inappropriate goals at this time) SLP Short Term Goal 4 (Week 1): Pt will demonstrate emergent awareness by identifying 2 cognitive and 2 physical deficits related to CVA wtih Mod A verbal cues.  SLP Short Term Goal 4 - Progress (Week 1): Discontinued (comment) (Inappropriate goal) SLP Short Term Goal 5 (Week 1): Pt will utilize speech intelligibility strategies with Mod A verbal cues to achieve ~>75% intelligibility at the simple conversation level.  SLP Short Term Goal 5 - Progress (Week 1): Not met    New Short Term Goals: Week 2: SLP Short Term Goal 1 (Week 2): Pt will demonstrate sustained attention to basic, familiar tasks for 5 minutes with Max A cues for redirection.  SLP Short Term Goal 2 (Week 2): Given choice of orientation information, pt will select correct answer with Max A multimodal cues.  SLP Short Term Goal 3 (Week 2): Pt will utilize speech intelligibility strategies with Max A verbal cues to achieve ~>75% intelligibility at  the simple conversation level.  SLP Short Term Goal 4 (Week 2): Given Max A multimodal cues, pt will complete functional 1 step basic, familiar directions.   Weekly Progress Updates: Pt has made variable progress towards goals this reporting period. Due to this variable progress, pt's goals were changed to better target deficits and level of assist needed to perform basic, familiar tasks. Pt requires Max A to total multimodal cues for completion go cognitive linguistic tasks. Skilled ST continues to be required to address these deficits, increase functional independence and reduce caregiver burden prior to discharge.      Intensity: Minumum of 1-2 x/day, 30 to 90 minutes Frequency: 3 to 5 out of 7 days Duration/Length of Stay: 18 to 21 days Treatment/Interventions: Cognitive remediation/compensation;Cueing hierarchy;Environmental controls;Functional tasks;Patient/family education;Medication managment   Daily Session  Skilled Therapeutic Interventions: Skilled treatment session focused on cognition goals. SLP facilitated session with OT d/t pt's significant deficits and pt refused individual ST session earlier in day. Pt with mild increased participation in the setting of functional tasks (such as getting dressed). Pt required Max A multimodal cues for attention, following 1 step directions and minimal expressive language. Will continue targeting deficits within functional setting, pt handed off to OT.     Function:    Cognition Comprehension Comprehension assist level: Understands basic 25 - 49% of the time/ requires cueing 50 - 75% of the time  Expression   Expression assist level: Expresses basic 25 - 49% of the time/requires cueing 50 - 75% of the time. Uses single words/gestures.  Social Interaction Social Interaction assist level: Interacts appropriately less than 25% of the time. May be withdrawn or combative.  Problem Solving Problem solving assist level: Solves basic 25 - 49% of the  time - needs direction more than half the time to initiate, plan or complete simple activities  Memory Memory assist level: Recognizes or recalls 25 - 49% of the time/requires cueing 50 - 75% of the time   General    Pain    Therapy/Group: Individual Therapy   Beaux Wedemeyer B. Rutherford Nail, M.S., CCC-SLP Speech-Language Pathologist  Avier Jech 04/11/2016, 12:11 PM

## 2016-04-11 NOTE — Progress Notes (Signed)
Physical Therapy Session Note  Patient Details  Name: [name] MRN: [id number] Date of Birth: 1955-04-26  Today's Date: 04/11/2016 PT Individual Time: 1625-1705 PT Individual Time Calculation (min): 40 min   Short Term Goals: Week 1:  PT Short Term Goal 1 (Week 1): Pt will demonstrate bed rolling Mod A PT Short Term Goal 2 (Week 1): Pt will demonstrate sit-to-stand Max A PT Short Term Goal 3 (Week 1): Pt will demonstrate supine <-> sit transfer Mod A  PT Short Term Goal 4 (Week 1): Pt will demonstrate squat pivot transfer mod A  PT Short Term Goal 5 (Week 1): Pt will ambulate 25 ft Total A x 1 with W/C follow  Skilled Therapeutic Interventions/Progress Updates:   Pt received supine in bed and agreeable to PT.  Supine<>sit transfer with mod- max assist and mod cues for posture and sequencing.   Sitting balance EOB with min assist from PT while PT donned shoes. Moderate cues to maintain anterior weight shift to prevent posterior LOB.   PT instructed patient in [place] transfer to and from [facility] with min assist. Min cues from PT for proper LE placement for safety.   Sit<>stand at parallel bars x 5 with mod assist from PT. Mod cues for proper UE placement, but patient noted to pull up on rails for each transfers.   Weight shifting in standing with min assist from PT to facilitate weight shift over the RLE. Marland Kitchen   Forward/backward stepping x 3 BLE with max assist from PT to advance the RLE and RUE. Constant cues to improve weight shift off the RLE to allow advancement of the limb.   Patient returned to room and left supine in bed with call bell in reach and all needs met.          Therapy Documentation Precautions:  Precautions Precautions: Fall Restrictions Weight Bearing Restrictions: No Vital Signs: Therapy Vitals Temp: 98.9 F (37.2 C) Temp Source: Oral Pulse Rate: 70 Resp: 17 BP: 137/90 Patient Position (if appropriate): Lying Oxygen Therapy SpO2: 96 % O2 Device: Not  Delivered Pain: Pain Assessment Pain Assessment: Faces Faces Pain Scale: Hurts a little bit Pain Location: Buttocks   See Function Navigator for Current Functional Status.   Therapy/Group: Individual Therapy  Lorie Phenix 04/11/2016, 6:00 PM

## 2016-04-12 ENCOUNTER — Inpatient Hospital Stay (HOSPITAL_COMMUNITY): Payer: Medicare Other

## 2016-04-12 ENCOUNTER — Inpatient Hospital Stay (HOSPITAL_COMMUNITY): Payer: Medicare Other | Admitting: Physical Therapy

## 2016-04-12 ENCOUNTER — Inpatient Hospital Stay (HOSPITAL_COMMUNITY): Payer: Medicare Other | Admitting: Occupational Therapy

## 2016-04-12 DIAGNOSIS — M545 Low back pain, unspecified: Secondary | ICD-10-CM | POA: Insufficient documentation

## 2016-04-12 LAB — GLUCOSE, CAPILLARY
GLUCOSE-CAPILLARY: 169 mg/dL — AB (ref 65–99)
GLUCOSE-CAPILLARY: 167 mg/dL — AB (ref 65–99)
Glucose-Capillary: 261 mg/dL — ABNORMAL HIGH (ref 65–99)
Glucose-Capillary: 86 mg/dL (ref 65–99)

## 2016-04-12 MED ORDER — MIRTAZAPINE 15 MG PO TABS
15.0000 mg | ORAL_TABLET | Freq: Every day | ORAL | Status: DC
  Administered 2016-04-12: 15 mg via ORAL
  Filled 2016-04-12: qty 1

## 2016-04-12 MED ORDER — NYSTATIN 100000 UNIT/ML MT SUSP
5.0000 mL | Freq: Four times a day (QID) | OROMUCOSAL | Status: DC
  Administered 2016-04-12 – 2016-04-25 (×50): 500000 [IU] via ORAL
  Filled 2016-04-12 (×50): qty 5

## 2016-04-12 MED ORDER — CHLORTHALIDONE 50 MG PO TABS
75.0000 mg | ORAL_TABLET | Freq: Every day | ORAL | Status: DC
  Administered 2016-04-12 – 2016-04-25 (×14): 75 mg via ORAL
  Filled 2016-04-12 (×15): qty 1

## 2016-04-12 NOTE — Progress Notes (Signed)
Social Work Patient ID: [name], male   DOB: [birth year], 62 y.o.   MRN: [id number]  Met with pt and spoke with daughter-Nicole-via telephone to discuss team conference goals and progress he has made This week. He has had some medical issues-UTI, requiring IV fluids, and nausea/vomiting. She reports she has not been here and maybe this is contributing to his depression. Her overall plan is to have pt go form here To a NH then once that is finished go to her home. She is moving into a bigger place to accommodate him when he is ready to be discharged from [facility]. Informed her have asked for neuro-psych to see him and for her and her Sister to come in and visit him. Will work on next venue when MD feels medically stable for this.

## 2016-04-12 NOTE — Progress Notes (Signed)
Occupational Therapy Session Note  Patient Details  Name: [name] MRN: [id number] Date of Birth: [birth year]  Today's Date: 04/12/2016 OT Individual Time: 0930-1000 OT Individual Time Calculation (min): 30 min  and Today's Date: 04/12/2016 OT Co-Treatment Time: 1000-1015 OT Co-Treatment Time Calculation (min): 15 min   Short Term Goals: Week 2:  OT Short Term Goal 1 (Week 2): Pt will complete LB dressing with max assist of one caregiver OT Short Term Goal 2 (Week 2): Pt will complete sit > stand with mod assist to decrease burden of care with LB dressing/hygiene OT Short Term Goal 3 (Week 2): Pt will complete functional transfer with LRAD to [facility] with Mod A of 1 to decrease caregiver burden  Skilled Therapeutic Interventions/Progress Updates: 1:1 Co treat with SLP for second part of session. Self care retraining at shower and sink level. Pt in bed when arrived with untouched breakfast infront of him. Pt with decr verbalizations and purposefully keeping his eyes closed. Pt agreed to showering to came to EOB with bed rail with HOB elevated with mod A. Used STEDY to transfer into the shower. Pt became incontient of urine while transitioning into bathroom. Pt did engage in bathing self seated on padded tub bench. Pt required A to use right UE to watch left UE for controlled coordination of movement. Pt performed sit to stand with grab bar on his right with mod A for OT to wash bottom and then return to sitting. Transitioned out of the shower with STEDY and to the sink. Pt remained in STEDY to perform oral care and donning a brief to work on trunk posture at midline and active participation in functional task; using mirror for visual feedback. Pt able to pull himself up into the STEDY with min to mod A (the lower the surface the more assistance) and able to maintain a standing position with bilateral UE support with right knee supported in STEDY with steady A. PT able to follow one step directions with  minimal cuing and extra time to execute tasks requested. Returned to the tilt in space wi/c and left with SLP for rest of their session.      Therapy Documentation Precautions:  Precautions Precautions: Fall Restrictions Weight Bearing Restrictions: No Pain:  no c/o pain in session  See Function Navigator for Current Functional Status.   Therapy/Group: Individual Therapy and Co-Treatment  Willeen Cass [facility] 04/12/2016, 7:15 PM

## 2016-04-12 NOTE — Progress Notes (Signed)
Social Work Elease Hashimoto, LCSW Social Worker Signed   Patient Care Conference Date of Service: 04/11/2016  3:05 PM      Hide copied text Hover for attribution information Inpatient RehabilitationTeam Conference and Plan of Care Update Date: 04/11/2016   Time: 10:45 AM      Patient Name: [name]      Medical Record Number: [id number]  Date of Birth: [birth year] Sex: Male         Room/Bed: 4M03C/4M03C-01 Payor Info: Payor: MEDICARE / Plan: MEDICARE PART A AND B / Product Type: *No Product type* /     Admitting Diagnosis: CVA  Admit Date/Time:  04/03/2016  4:08 PM Admission Comments: No comment available    Primary Diagnosis:  <principal problem not specified> Principal Problem: <principal problem not specified>       Patient Active Problem List    Diagnosis Date Noted  . Low back pain    . Leukocytosis    . Labile blood glucose    . Hypoglycemia due to insulin    . Acute lower UTI    . Deep tissue injury    . Renovascular hypertension    . History of DVT (deep vein thrombosis)    . Coronary artery disease involving native coronary artery of native heart without angina pectoris    . Morbid obesity ([place]    . Stage 2 chronic kidney disease    . Hypokalemia    . Acute blood loss anemia    . Embolic stroke involving posterior cerebral artery ([place]) 04/03/2016  . Brainstem stroke ([place]) 04/01/2016  . Abnormality of gait and mobility    . Cerebral infarction due to thrombosis of other cerebral artery ([place])    . Right arm numbness    . Left sided numbness 03/29/2016  . TIA (transient ischemic attack) 03/29/2016  . Hypertensive urgency 03/29/2016  . Sepsis ([place]) 09/10/2013  . DVT (deep venous thrombosis) ([place]) 01/01/2013  . Rash and nonspecific skin eruption 12/29/2012  . Cellulitis and abscess of leg 12/29/2012  . Diabetes type 2, uncontrolled ([place]) 12/29/2012  . Hypertension    . Cardiomyopathy ([place])    . CHF (congestive heart failure) ([place])    . Peripheral neuropathy  ([place])    . Special screening for malignant neoplasms, colon 12/09/2012      Expected Discharge Date:     Team Members Present: Physician leading conference: Dr. Delice Lesch Social Worker Present: Ovidio Kin, LCSW Nurse Present: Heather Roberts, RN PT Present: Carney Living, PT OT Present: Willeen Cass, OT SLP Present: Other (comment) (Happi Overton-SP) PPS Coordinator present : Daiva Nakayama, RN, CRRN       Current Status/Progress Goal Weekly Team Focus  Medical     Right hemiparesis and functional deficits secondary to left paramedian pontine infarct on 1/31  Improve mobility, cognition, safety, HTN, DM, UTI  See above   Bowel/Bladder     incontinent of bladder at times and continent of [place]ell  toileting him every 2 hours ,help with urinal  assistance with urinal  more often to keep him continent   Swallow/Nutrition/ Hydration               ADL's     Max A overall, Mod A UB dressing  Min A overall  modified bathing/dressing, R NMR, R attention, functional transfers, functional communication, sitting balance, improved sit<>stand   Mobility     max A bed mobility and transfers, S sitting balance, +2 gait for w/c follow  min A overall  functional mobility training, R NMR, coordination, postural control, midline reorientation, sitting > standing balance, activity tolerance, attention   Communication     Mod A  Supervision  continue addressing speech intelligiblity, word finding strategies   Safety/Cognition/ Behavioral Observations   Max A  Min A  focus on sustaine dattention for 10 minutes, following simple 1 step directions, basic problem solving   Pain     less<4  assess and adress it every 4 hours  pain free   Skin     sacrum is red covered by foam, both heels got deep tissue injuries covered by foams,prevalon boots on all the time on he is the bed, air matress bed  monitor every shift   monitor, assess and adress every shift and as need it     *See Care Plan and progress  notes for long and short-term goals.   Barriers to Discharge: Cognition, safety, HTN, DM, CKD, UTI     Possible Resolutions to Barriers:  Therapies, optimize HTN, DM meds, follow labs abx      Discharge Planning/Teaching Needs:  Pt's plan is to go to a NH from here due to has no caregivers. Daughter's will check in and out but work and have families. Daughter to apply for medicaid while pt here.      Team Discussion:  Making progress slowly-starting to refuse and not participate fully-have asked neuro-psych to see question depression. MD adjusting diabetes and HTN meds. Had to I & O cath yesterday due to retaining. UTI being treated for. Pain in bottom-MD made aware and prescribed lidoderm patch and will try air mattress. Push po fluids too dry.   Revisions to Treatment Plan:  Plan for NHP due to no caregivers.    Continued Need for Acute Rehabilitation Level of Care: The patient requires daily medical management by a physician with specialized training in physical medicine and rehabilitation for the following conditions: Daily direction of a multidisciplinary physical rehabilitation program to ensure safe treatment while eliciting the highest outcome that is of practical value to the patient.: Yes Daily medical management of patient stability for increased activity during participation in an intensive rehabilitation regime.: Yes Daily analysis of laboratory values and/or radiology reports with any subsequent need for medication adjustment of medical intervention for : Neurological problems;Diabetes problems;Blood pressure problems;Renal problems;Other   Elease Hashimoto 04/12/2016, 8:37 AM      Elease Hashimoto, LCSW Social Worker Signed   Patient Care Conference Date of Service: 04/05/2016  8:23 AM      Hide copied text Hover for attribution information Inpatient RehabilitationTeam Conference and Plan of Care Update Date: 04/04/2016   Time: 2:20 PM      Patient Name: [name]       Medical Record Number: [id number]  Date of Birth: 1956-01-08 Sex: Male         Room/Bed: 4M03C/4M03C-01 Payor Info: Payor: MEDICARE / Plan: MEDICARE PART A AND B / Product Type: *No Product type* /     Admitting Diagnosis: CVA  Admit Date/Time:  04/03/2016  4:08 PM Admission Comments: No comment available    Primary Diagnosis:  <principal problem not specified> Principal Problem: <principal problem not specified>       Patient Active Problem List    Diagnosis Date Noted  . Renovascular hypertension    . History of DVT (deep vein thrombosis)    . Coronary artery disease involving native coronary artery of native heart without angina pectoris    .  Morbid obesity ([place])    . Stage 2 chronic kidney disease    . Hypokalemia    . Acute blood loss anemia    . Embolic stroke involving posterior cerebral artery ([place]) 04/03/2016  . Brainstem stroke ([place]) 04/01/2016  . Abnormality of gait and mobility    . Cerebral infarction due to thrombosis of other cerebral artery ([place])    . Right arm numbness    . Left sided numbness 03/29/2016  . TIA (transient ischemic attack) 03/29/2016  . Hypertensive urgency 03/29/2016  . Sepsis ([place]) 09/10/2013  . DVT (deep venous thrombosis) ([place]) 01/01/2013  . Rash and nonspecific skin eruption 12/29/2012  . Cellulitis and abscess of leg 12/29/2012  . Diabetes type 2, uncontrolled ([place]) 12/29/2012  . Hypertension    . Cardiomyopathy ([place])    . CHF (congestive heart failure) ([place])    . Peripheral neuropathy ([place])    . Special screening for malignant neoplasms, colon 12/09/2012      Expected Discharge Date:     Team Members Present: Physician leading conference: Dr. Delice Lesch Social Worker Present: Ovidio Kin, LCSW Nurse Present: Heather Roberts, RN PT Present: Carney Living, PT OT Present: Cherylynn Ridges, OT SLP Present: Other (comment) (Happi Overton-SP) PPS Coordinator present : Daiva Nakayama, RN, CRRN       Current Status/Progress Goal Weekly Team  Focus  Medical     Right hemiparesis and functional deficits secondary to left paramedian pontine infarct  Improve mobility, cogntion, safety, comorbidities  See above   Bowel/Bladder          eval pending     Swallow/Nutrition/ Hydration               ADL's     Max+2 overall,   Min A overall  pt/family ed, attention, awareness,    Mobility     max +2A  min A overall  functional mobility training, RUE/RLE NMR, coordination, postural control, midline reorientation, sitting/standing balance, activity tolerance, pt/fam education   Communication               Safety/Cognition/ Behavioral Observations             Pain          less than 3     Skin          skin issues form diabetes       *See Care Plan and progress notes for long and short-term goals.   Barriers to Discharge: Cognition, safety, HTN, DM, CKD, hypokalemia, ABLA, CHF     Possible Resolutions to Barriers:  Therapies, optimize HTN, DM meds, follow labs and weights     Discharge Planning/Teaching Needs:    Does not have 24 hr care will need to go to a NH upon discharge from rehab     Team Discussion:  New eval-goals overall min assist level. Currently plus 2 total. Skin issues-RN & MD aware working on. Delayed processing and motor coordination. MD monitoring labs  Revisions to Treatment Plan:  New eval    Continued Need for Acute Rehabilitation Level of Care: The patient requires daily medical management by a physician with specialized training in physical medicine and rehabilitation for the following conditions: Daily direction of a multidisciplinary physical rehabilitation program to ensure safe treatment while eliciting the highest outcome that is of practical value to the patient.: Yes Daily medical management of patient stability for increased activity during participation in an intensive rehabilitation regime.: Yes Daily analysis of laboratory values and/or  radiology reports with any subsequent need for  medication adjustment of medical intervention for : Neurological problems;Diabetes problems;Blood pressure problems;Renal problems;Other   Elease Hashimoto 04/05/2016, 8:23 AM       Patient ID: [name], male   DOB: March 23, 1955, 61 y.o.   MRN: [id number]

## 2016-04-12 NOTE — Plan of Care (Signed)
Problem: RH BOWEL ELIMINATION Goal: RH STG MANAGE BOWEL W/MEDICATION W/ASSISTANCE STG Manage Bowel with Medication with min Assistance.    Outcome: Not Progressing Patient LBM2/11; laxatives given

## 2016-04-12 NOTE — Evaluation (Signed)
Speech Language Pathology Assessment and Plan  Patient Details  Name: [name] MRN: [id number] Date of Birth: [birth year]  SLP Diagnosis: Cognitive Impairments;Dysphagia  Rehab Potential: Good ELOS: 18 to 21 days    Today's Date: 04/12/2016 SLP Individual Time: 1030-1100 SLP Individual Time Calculation (min): 30 min   Problem List:  Patient Active Problem List   Diagnosis Date Noted  . Low back pain   . Leukocytosis   . Labile blood glucose   . Hypoglycemia due to insulin   . Acute lower UTI   . Deep tissue injury   . Renovascular hypertension   . History of DVT (deep vein thrombosis)   . Coronary artery disease involving native coronary artery of native heart without angina pectoris   . Morbid obesity ([place])   . Stage 2 chronic kidney disease   . Hypokalemia   . Acute blood loss anemia   . Embolic stroke involving posterior cerebral artery ([place]) 04/03/2016  . Brainstem stroke ([place]) 04/01/2016  . Abnormality of gait and mobility   . Cerebral infarction due to thrombosis of other cerebral artery ([place])   . Right arm numbness   . Left sided numbness 03/29/2016  . TIA (transient ischemic attack) 03/29/2016  . Hypertensive urgency 03/29/2016  . Sepsis ([place]) 09/10/2013  . DVT (deep venous thrombosis) ([place]) 01/01/2013  . Rash and nonspecific skin eruption 12/29/2012  . Cellulitis and abscess of leg 12/29/2012  . Diabetes type 2, uncontrolled ([place]) 12/29/2012  . Hypertension   . Cardiomyopathy ([place])   . CHF (congestive heart failure) ([place])   . Peripheral neuropathy ([place])   . Special screening for malignant neoplasms, colon 12/09/2012   Past Medical History:  Past Medical History:  Diagnosis Date  . Adrenal insufficiency ([place])   . Allergy   . Arthritis   . Blind left eye   . Carpal tunnel syndrome   . Cataract    bil removed  . CHF (congestive heart failure) ([place])   . Diabetes mellitus without complication ([place])   . ED (erectile dysfunction)   . Gout   .  Herpes   . Hypercholesterolemia   . Hypertension   . MI (myocardial infarction)   . Peripheral neuropathy ([place])   . Pneumonia   . PVD (peripheral vascular disease) ([place])    Past Surgical History:  Past Surgical History:  Procedure Laterality Date  . CHOLECYSTECTOMY    . dilatera cataracts removed    . right knee surgary    . stents      Assessment / Plan / Recommendation Clinical Impression ST notified that pt with difficulty consuming medicine whole with thin liquids and with coughing with thin liquids. Pt agreeable to to consuming thin liquids with SLP. Pt consumed 1/2 cup of thin liquids via straw in one large gulp. Pt with immediate cough. Straw removed and no overt s/s present with cup sip. Nursing advises that pt consumed medicien whole in applesauce and is requesting SLP to place order. Pt continued to consume thin liquids for remainder of session via cup without overt s/s of aspiration. Given pt's cognitive impairments and impulsivity, recommend intermittent supervision with PO intake and no straws.    Skilled Therapeutic Interventions          Pt with functional consumption of thin liquids via cup, discharged straw, signage placed head of bed and nursing informed. Additionally, skilled session focused on cognition goals,. SLP facilitated session by providing treatment in conjunction with OT for facilitated of sustained attention to  tasks for ~5 minutes with Max A verbal cues and to follow simple 1 step directions with extra time and Max A multimodal cues. Pt was left upright in wheelchair, tilted, safety belt donned and call light in reach. Continue per current plan of care.     SLP Assessment  Patient will need skilled Speech Lanaguage Pathology Services during CIR admission    Recommendations  SLP Diet Recommendations: Age appropriate regular solids;Thin Liquid Administration via: Cup Medication Administration: Whole meds with puree Supervision: Patient able to self  feed;Staff to assist with self feeding;Intermittent supervision to cue for compensatory strategies (Set up meal for pt) Compensations: Minimize environmental distractions;Slow rate;Small sips/bites Postural Changes and/or Swallow Maneuvers: Seated upright 90 degrees Oral Care Recommendations: Oral care BID Patient destination: [facility] (SNF) Follow up Recommendations: Skilled Nursing facility Equipment Recommended: None recommended by SLP    SLP Frequency 3 to 5 out of 7 days   SLP Duration  SLP Intensity  SLP Treatment/Interventions 18 to 21 days  Minumum of 1-2 x/day, 30 to 90 minutes  Cognitive remediation/compensation;Cueing hierarchy;Environmental controls;Functional tasks;Patient/family education;Medication managment;Dysphagia/aspiration precaution training;Therapeutic Activities    Pain    Prior Functioning    Function:  Eating Eating   Modified Consistency Diet: No Eating Assist Level: Set up assist for   Eating Set Up Assist For: Opening containers       Cognition Comprehension Comprehension assist level: Understands basic 50 - 74% of the time/ requires cueing 25 - 49% of the time  Expression   Expression assist level: Expresses basic 25 - 49% of the time/requires cueing 50 - 75% of the time. Uses single words/gestures.  Social Interaction Social Interaction assist level: Interacts appropriately 75 - 89% of the time - Needs redirection for appropriate language or to initiate interaction.  Problem Solving Problem solving assist level: Solves basic 25 - 49% of the time - needs direction more than half the time to initiate, plan or complete simple activities  Memory Memory assist level: Recognizes or recalls 25 - 49% of the time/requires cueing 50 - 75% of the time   Short Term Goals: Week 2: SLP Short Term Goal 1 (Week 2): Pt will demonstrate sustained attention to basic, familiar tasks for 5 minutes with Max A cues for redirection.  SLP Short Term  Goal 2 (Week 2): Given choice of orientation information, pt will select correct answer with Max A multimodal cues.  SLP Short Term Goal 3 (Week 2): Pt will utilize speech intelligibility strategies with Max A verbal cues to achieve ~>75% intelligibility at the simple conversation level.  SLP Short Term Goal 4 (Week 2): Given Max A multimodal cues, pt will complete functional 1 step basic, familiar directions.  SLP Short Term Goal 5 (Week 2): Pt will consume least restrictive diet without overt s/s of aspiration with Min A cues for use of compensatory swallow strategies.   Refer to Care Plan for Long Term Goals  Recommendations for other services: Neuropsych  Discharge Criteria: Patient will be discharged from SLP if patient refuses treatment 3 consecutive times without medical reason, if treatment goals not met, if there is a change in medical status, if patient makes no progress towards goals or if patient is discharged from hospital.  The above assessment, treatment plan, treatment alternatives and goals were discussed and mutually agreed upon: by patient   Calistro Rauf B. Rutherford Nail, M.S., CCC-SLP Speech-Language Pathologist  Laisa Larrick 04/12/2016, 12:04 PM

## 2016-04-12 NOTE — Progress Notes (Signed)
[facility]IC[facility] RE[facility]ABILITATION     PROGRESS NOTE  Subjective/Complaints:  Pt laying in bed this AM.  He keeps his eyes closed for most of the exam.  He states he slept well overnight. He denies complaints.   ROS: Denies CP, SOB, N/V/D.  Objective: Vital Signs: Blood pressure (!) 155/74, pulse 77, temperature 98.6 F (37 C), temperature source Oral, resp. rate 18, height 5\' 9"  (1.753 m), weight 107.4 kg (236 lb 12.4 oz), SpO2 98 %. No results found.  Recent Labs  04/11/16 0516  WBC 11.9*  HGB 12.8*  HCT 39.4  PLT 306    Recent Labs  04/11/16 0516  NA 138  K 4.0  CL 101  GLUCOSE 180*  BUN 43*  CREATININE 1.51*  CALCIUM 8.8*   CBG (last 3)   Recent Labs  04/11/16 1720 04/11/16 2050 04/12/16 0700  GLUCAP 106* 151* 169*    Wt Readings from Last 3 Encounters:  04/11/16 107.4 kg (236 lb 12.4 oz)  03/28/16 111.1 kg (245 lb)  09/04/13 120.2 kg (265 lb)    Physical Exam:  BP (!) 155/74 (BP Location: Left Arm)   Pulse 77   Temp 98.6 F (37 C) (Oral)   Resp 18   Ht 5\' 9"  (1.753 m)   Wt 107.4 kg (236 lb 12.4 oz)   SpO2 98%   BMI 34.97 kg/m  Constitutional: He appears well-developed. Obese. HENT: Normocephalic and atraumatic.  Eyes: EOM are normal. No discharge.  Cardiovascular: RRR.  No JVD. Respiratory: Effort normal and breath sounds normal.  GI: Soft. Bowel sounds are normal.  Musculoskeletal: He exhibits no edema. He exhibits no tenderness.  Neurological: He is alert and oriented.  Dysarthria.  Limited vision.  Poor insight and awareness.    Motor: 4+/5 RUE prox to distal. Ataxia (unchanged.  RLE: 4/+/5 proximal to distal  LLE 4+ /5 proximal to distal.  LUE/LLE proximal to distal 5/5.  Skin: Dry scaly skin with scabs b/l distal LE R>L. Right heel DTI, LLE 3rd distal digit blister. Psychiatric: Flat affect. Slowed behavior  Assessment/Plan: 1. Functional deficits secondary to left paramedian pontine infarct which require 3+ hours  per day of interdisciplinary therapy in a comprehensive inpatient rehab setting. Physiatrist is providing close team supervision and 24 hour management of active medical problems listed below. Physiatrist and rehab team continue to assess barriers to discharge/monitor patient progress toward functional and medical goals.  Function:  Bathing Bathing position   Position: Wheelchair/chair at sink  Bathing parts Body parts bathed by patient: Right arm, Chest, Abdomen, Right upper leg, Left upper leg Body parts bathed by helper: Left lower leg, Right lower leg, Buttocks, Front perineal area, Left arm  Bathing assist Assist Level:  (Max A)      Upper Body Dressing/Undressing Upper body dressing   What is the patient wearing?: Button up shirt     Pull over shirt/dress - Perfomed by patient: Thread/unthread left sleeve, Put head through opening Pull over shirt/dress - Perfomed by helper: Thread/unthread right sleeve, Pull shirt over trunk Button up shirt - Perfomed by patient: Thread/unthread left sleeve, Thread/unthread right sleeve Button up shirt - Perfomed by helper: Pull shirt around back, Button/unbutton shirt    Upper body assist Assist Level: Touching or steadying assistance(Pt > 75%)      Lower Body Dressing/Undressing Lower body dressing   What is the patient wearing?: Pants       Pants- Performed by helper: Thread/unthread right pants leg, Thread/unthread left  pants leg, Pull pants up/down, Fasten/unfasten pants   Non-skid slipper socks- Performed by helper: Don/doff left sock, Don/doff right sock       Shoes - Performed by helper: Don/doff right shoe, Don/doff left shoe, Fasten right, Fasten left   AFO - Performed by helper: Don/doff right AFO, Don/doff left AFO (ACE wraps)   TED Hose - Performed by helper: Don/doff right TED hose, Don/doff left TED hose  Lower body assist Assist for lower body dressing:  (total A)      Toileting Toileting Toileting activity did  not occur: No continent bowel/bladder event   Toileting steps completed by helper: Adjust clothing prior to toileting, Adjust clothing after toileting, Performs perineal hygiene Toileting Assistive Devices: Other (comment) (urinal)  Toileting assist Assist level: Touching or steadying assistance (Pt.75%)   Transfers Chair/bed transfer   Chair/bed transfer method: Squat pivot Chair/bed transfer assist level: Maximal assist (Pt 25 - 49%/lift and lower) Chair/bed transfer assistive device: Armrests Mechanical lift: Stedy   Locomotion Ambulation     Max distance: 15 ft Assist level: 2 helpers   Wheelchair   Type: Manual (TIS) Max wheelchair distance: 50 ft Assist Level: Touching or steadying assistance (Pt > 75%) (BLE only)  Cognition Comprehension Comprehension assist level: Understands basic 50 - 74% of the time/ requires cueing 25 - 49% of the time  Expression Expression assist level: Expresses basic 25 - 49% of the time/requires cueing 50 - 75% of the time. Uses single words/gestures.  Social Interaction Social Interaction assist level: Interacts appropriately 75 - 89% of the time - Needs redirection for appropriate language or to initiate interaction.  Problem Solving Problem solving assist level: Solves basic 25 - 49% of the time - needs direction more than half the time to initiate, plan or complete simple activities  Memory Memory assist level: Recognizes or recalls 25 - 49% of the time/requires cueing 50 - 75% of the time    Medical Problem List and Plan: 1.  Right hemiparesis and functional deficits secondary to left paramedian pontine infarct on 1/31  Cont CIR  Baclofen started for spasms d/ced on 2/14 due to lethargy 2. H/o DVT /Anticoagulation: Pharmaceutical: Xarelto 3. Pain Management:   Lidoderm patch started due to therapy reports of pain - patient states improved, however, ?reliability.  4. Mood: LCSW to follow for evaluation and support.  5. Neuropsych: This  patient is not yet capable of making decisions on his own behalf.  Discussed possibility of Neuropsych eval, however, pt unlikely to cooperate 6. Skin/Wound Care: Routine pressure relief measures. Eucerin to b/l LE.  Prevalon boots ordered for DTI 7. Fluids/Electrolytes/Nutrition: Monitor I/Os  Intake has been variable affecting CBGs  Remeron added 2/15 8. HTN: Monitor BP.   Continue coreg, cozaar, Bidil and lasix.     Chlorthalidone increased on 2/8, again on 2/12, increased again on 2/15 9. T2DM with retinopathy and neuropathy: Blood sugars poorly controlled with A1C- 11.0. Consult RD to educated on [place]. Monitor BS ac/hs.   Novolog 70/30 decreased to 30 daily  Novolog 70/30 30 Qhs  Novalog decreased to 2U TID  qHS snack added 2/14  Stabalizing, however, will likely change with intake  Monitor with increased mobility 10. CAD/ Acute systolic CHF/NICM: compensated. Check daily weights and monitor for signs of overload. Heart healthy/low salt diet. Will need perfusion study on outpatient basis per cardiology. Cont meds Filed Weights   04/09/16 0451 04/10/16 0602 04/11/16 0541  Weight: 108.3 kg (238 lb 12.1 oz) 108.8 kg (  239 lb 13.8 oz) 107.4 kg (236 lb 12.4 oz)   Clinically stable 11. Morbid Obesity: BMI - 36. Encourage appropriate diet, weight loss and exercise.  12. CKD:   Cr. 1.51 on 2/14  Encourage fluids  IVF x24 hours ordered 2/14  Labs ordered for tomorrow 13. Hypokalemia:   Increased K+ supplement 2/9  K+ 4.0 on 2/14  Cont to monitor 14. ABLA  Hb 12.8 on 2/14  Cont to monitor 15. Acute lower UTI  UA+, Ucx with Pseudomonas and enterococcus  Spoke with pharmacy, macrobid changed to Cipro 2/12-2/18  LOS (Days) 9 A FACE TO FACE EVALUATION WAS PERFORMED  Shatona Andujar Lorie Phenix 04/12/2016 8:08 AM

## 2016-04-12 NOTE — Progress Notes (Signed)
Physical Therapy Weekly Progress Note  Patient Details  Name: [name] MRN: [id number] Date of Birth: 1956/01/21  Beginning of progress report period: April 04, 2016 End of progress report period: April 12, 2016  Today's Date: 04/12/2016  Patient has met 2 of 5 short term goals. Patient making slow progress this reporting period and continues to require max A to +2A for functional mobility tasks. Patient demonstrating improved static sitting balance with supervision while maintaining midline orientation when alert/keeping eyes open. Patient limited by buttock/LE pain that is difficult to determine nature and exact location of pain as patient demonstrates minimal verbal expression and frequently closes his eyes and declines to respond to basic questions regarding comfort and positioning.  Patient continues to demonstrate the following deficits muscle weakness and muscle joint tightness, decreased cardiorespiratoy endurance, impaired timing and sequencing, abnormal tone, unbalanced muscle activation, ataxia, decreased coordination and decreased motor planning, decreased visual acuity, decreased midline orientation and decreased attention to right, decreased initiation, decreased attention, decreased awareness, decreased problem solving, decreased safety awareness, decreased memory and delayed processing and decreased sitting balance, decreased standing balance, decreased postural control and decreased balance strategies and therefore will continue to benefit from skilled PT intervention to increase functional independence with mobility.  Patient progressing toward long term goals.  Continue plan of care.  PT Short Term Goals Week 1:  PT Short Term Goal 1 (Week 1): Pt will demonstrate bed rolling Mod A PT Short Term Goal 1 - Progress (Week 1): Not met PT Short Term Goal 2 (Week 1): Pt will demonstrate sit-to-stand Max A PT Short Term Goal 2 - Progress (Week 1): Met PT Short Term Goal 3  (Week 1): Pt will demonstrate supine <-> sit transfer Mod A  PT Short Term Goal 3 - Progress (Week 1): Not met PT Short Term Goal 4 (Week 1): Pt will demonstrate squat pivot transfer mod A  PT Short Term Goal 4 - Progress (Week 1): Not met PT Short Term Goal 5 (Week 1): Pt will ambulate 25 ft Total A x 1 with W/C follow PT Short Term Goal 5 - Progress (Week 1): Met Week 2:  PT Short Term Goal 1 (Week 2): Pt will perform bed mobility with mod A using hospital bed functions. PT Short Term Goal 2 (Week 2): Patient will transfer sit <> stand with mod A 50% of available opportunities.  PT Short Term Goal 3 (Week 2): Pt will transfer bed <> wheelchair with mod A 25% of available opportunities.    Arkin Imran, Murray Hodgkins 04/12/2016, 9:49 AM

## 2016-04-12 NOTE — Progress Notes (Signed)
Physical Therapy Session Note  Patient Details  Name: [name] MRN: :[id number] Date of Birth: 1955/12/07  Today's Date: 04/12/2016 PT Individual Time: [id number] PT Individual Time Calculation (min): 20 min  and Today's Date: 04/12/2016 PT Missed Time: 25 Minutes Missed Time Reason: Patient unwilling to participate  Short Term Goals: Week 2:  PT Short Term Goal 1 (Week 2): Pt will perform bed mobility with mod A using hospital bed functions. PT Short Term Goal 2 (Week 2): Patient will transfer sit <> stand with mod A 50% of available opportunities.  PT Short Term Goal 3 (Week 2): Pt will transfer bed <> wheelchair with mod A 25% of available opportunities.   Skilled Therapeutic Interventions/Progress Updates:    Pt presents up in w/c with eyes closed and minimal engagement throughout session. Offered patient various option to work on during session but pt would not respond. Attempted to engage patient in standing activity in parallel bars - with extra time to initiate pt leaned forward and able to take feet off of legrests and then let himself slump back in w/c. Continued to assess if something was bothering patient, if he was in pain, tired, etc and pt would just keep eyes closed and lean back in w/c. Finally pt opened eyes and came forward holding up a fist saying "I want to punch you in the face." Expressed to the patient that this is inappropriate and encouraged him to let me know what I did to make him want to punch me, pt unable to state and continues to sit with eyes closed. Returned to room and offered once again other options but pt continues to refuse. RN and PA made aware. A few minutes after PT left room, patient asked NT to turn on his TV. Pt missed 25 min of skilled intervention.  Therapy Documentation Precautions:  Precautions Precautions: Fall Restrictions Weight Bearing Restrictions: No General: PT Amount of Missed Time (min): 25 Minutes PT Missed Treatment Reason:  Patient unwilling to participate   Pain:  Pt shakes head no when asked if having pain.   See Function Navigator for Current Functional Status.   Therapy/Group: Individual Therapy  [name], PT, DPT  04/12/2016, 11:47 AM

## 2016-04-13 ENCOUNTER — Inpatient Hospital Stay (HOSPITAL_COMMUNITY): Payer: Medicare Other | Admitting: *Deleted

## 2016-04-13 ENCOUNTER — Inpatient Hospital Stay (HOSPITAL_COMMUNITY): Payer: Medicare Other | Admitting: Speech Pathology

## 2016-04-13 ENCOUNTER — Inpatient Hospital Stay (HOSPITAL_COMMUNITY): Payer: Medicare Other | Admitting: Occupational Therapy

## 2016-04-13 ENCOUNTER — Inpatient Hospital Stay (HOSPITAL_COMMUNITY): Payer: Medicare Other | Admitting: Physical Therapy

## 2016-04-13 DIAGNOSIS — N289 Disorder of kidney and ureter, unspecified: Secondary | ICD-10-CM

## 2016-04-13 DIAGNOSIS — N189 Chronic kidney disease, unspecified: Secondary | ICD-10-CM | POA: Insufficient documentation

## 2016-04-13 DIAGNOSIS — N179 Acute kidney failure, unspecified: Secondary | ICD-10-CM

## 2016-04-13 LAB — CBC WITH DIFFERENTIAL/PLATELET
BASOS ABS: 0 10*3/uL (ref 0.0–0.1)
BASOS PCT: 0 %
EOS ABS: 0.2 10*3/uL (ref 0.0–0.7)
Eosinophils Relative: 2 %
HCT: 37 % — ABNORMAL LOW (ref 39.0–52.0)
HEMOGLOBIN: 12 g/dL — AB (ref 13.0–17.0)
Lymphocytes Relative: 28 %
Lymphs Abs: 2.7 10*3/uL (ref 0.7–4.0)
MCH: 28.8 pg (ref 26.0–34.0)
MCHC: 32.4 g/dL (ref 30.0–36.0)
MCV: 88.9 fL (ref 78.0–100.0)
Monocytes Absolute: 0.9 10*3/uL (ref 0.1–1.0)
Monocytes Relative: 10 %
NEUTROS PCT: 60 %
Neutro Abs: 5.9 10*3/uL (ref 1.7–7.7)
Platelets: 321 10*3/uL (ref 150–400)
RBC: 4.16 MIL/uL — ABNORMAL LOW (ref 4.22–5.81)
RDW: 12.5 % (ref 11.5–15.5)
WBC: 9.7 10*3/uL (ref 4.0–10.5)

## 2016-04-13 LAB — BASIC METABOLIC PANEL
ANION GAP: 12 (ref 5–15)
BUN: 45 mg/dL — ABNORMAL HIGH (ref 6–20)
CALCIUM: 9.1 mg/dL (ref 8.9–10.3)
CO2: 27 mmol/L (ref 22–32)
CREATININE: 1.63 mg/dL — AB (ref 0.61–1.24)
Chloride: 101 mmol/L (ref 101–111)
GFR, EST AFRICAN AMERICAN: 51 mL/min — AB (ref >60)
GFR, EST NON AFRICAN AMERICAN: 44 mL/min — AB (ref >60)
Glucose, Bld: 115 mg/dL — ABNORMAL HIGH (ref 65–99)
Potassium: 3.7 mmol/L (ref 3.5–5.1)
SODIUM: 140 mmol/L (ref 135–145)

## 2016-04-13 LAB — GLUCOSE, CAPILLARY
GLUCOSE-CAPILLARY: 140 mg/dL — AB (ref 65–99)
GLUCOSE-CAPILLARY: 146 mg/dL — AB (ref 65–99)
Glucose-Capillary: 229 mg/dL — ABNORMAL HIGH (ref 65–99)
Glucose-Capillary: 86 mg/dL (ref 65–99)

## 2016-04-13 MED ORDER — SODIUM CHLORIDE 0.9 % IV SOLN: INTRAVENOUS | Status: DC

## 2016-04-13 MED ORDER — SODIUM CHLORIDE 0.9 % IV SOLN
INTRAVENOUS | Status: DC
  Administered 2016-04-13 – 2016-04-17 (×6): via INTRAVENOUS

## 2016-04-13 MED ORDER — MIRTAZAPINE 7.5 MG PO TABS
7.5000 mg | ORAL_TABLET | Freq: Every day | ORAL | Status: DC
  Administered 2016-04-13 – 2016-04-24 (×12): 7.5 mg via ORAL
  Filled 2016-04-13 (×12): qty 1

## 2016-04-13 MED ORDER — FLUOXETINE HCL 10 MG PO CAPS
10.0000 mg | ORAL_CAPSULE | Freq: Every day | ORAL | Status: DC
  Administered 2016-04-13 – 2016-04-25 (×13): 10 mg via ORAL
  Filled 2016-04-13 (×13): qty 1

## 2016-04-13 MED ORDER — SODIUM CHLORIDE 0.9 % IV SOLN
INTRAVENOUS | Status: DC
  Administered 2016-04-19 – 2016-04-24 (×6): via INTRAVENOUS

## 2016-04-13 MED ORDER — METHYLPHENIDATE HCL 5 MG PO TABS: 5.0000 mg | ORAL_TABLET | Freq: Two times a day (BID) | ORAL | Status: DC

## 2016-04-13 NOTE — Progress Notes (Signed)
Patient with difficulty staying awake with PT. Started on Remeron last night and BMET with worsening of renal status. Po intake continues to be poor and lethargy likely multifactorial.  Will decrease Remeron to 7.5 mg and monitor tolerance. Will start IVF for gentle hydration and recheck lytes in am.

## 2016-04-13 NOTE — Progress Notes (Signed)
Occupational Therapy Session Note  Patient Details  Name: [name] MRN: [id number] Date of Birth: 19-Oct-1955  Today's Date: 04/13/2016 OT Individual Time: [id number]  And 1300-1407 OT Individual Time Calculation (min): 28 min and 67 min   Skilled Therapeutic Interventions/Progress Updates: Pt was sitting in w/c with his daughter [name] feeding him breakfast. Tx focus on self feeding, R UE NMR, initiation, family education, and visual scanning. Cues provided for pt to open his eyes but was engaged afterwards. With cues pt able to bilaterally integrate UEs for lifting drinking cup to mouth and eating applesauce. Cues for small sips/bites provided throughout, as well as [name] education regarding helping to facilitate pt independence during self feeding in the future. At end of session pt was left with [name] in [facility] with safety belt donned.     Pt was lying in bed at time of arrival. Supine<sit completed with Total A with cues for hand placement and keeping his eyes open. Pt more alert at EOB, assisted OT in lateral scooting with bilateral UEs towards chair for squat pivot. He completed transfer<w/c with Max A and 1 extra helper providing min guard, manual facilitation for weight shift provided as well as safety cues. Pt was escorted to gym, squat pivot<EOM completed in manner as written above. Pt engaged in dynamic sitting task focusing on R UE NMR, visual field scanning, and reaching towards left side to address right pushing. 1 posterior LOB with pt able to correct with cues. Sit<stand completed with Max A for power up and 2 helpers for maintenance of upright balance with visual biofeedback provided in order for pt to correct posture. Pt exhibited strong right lean, and 2 anterior LOBs with Max A to correct. Pt completed x3 sit<stands, motivation visibly increased by presence of friends/family members. Verbal/tactile cues provided throughout for safe hand placement and upright postural alignment.  During transfer back to chair, pts brief dropped to ankles. Additional sit<stand completed in room at sink (in manner as written above) for donning new brief. Pt was then reclined in TIS and left with safety belt donned and family present.   Therapy Documentation Precautions:  Precautions Precautions: Fall Restrictions Weight Bearing Restrictions: No General: General PT Missed Treatment Reason: Patient fatigue Vital Signs: Therapy Vitals Temp: 98.8 F (37.1 C) Temp Source: Oral Pulse Rate: (!) 58 Resp: 18 BP: (!) 137/47 Oxygen Therapy SpO2: 98 % O2 Device: Not Delivered Pain: No c/o pain during sessions    ADL: ADL ADL Comments: Please see functional navigator:    See Function Navigator for Current Functional Status.   Therapy/Group: Individual Therapy  Rosell Khouri A Zeba Luby 04/13/2016, 12:20 PM

## 2016-04-13 NOTE — Progress Notes (Signed)
Physical Therapy Session Note  Patient Details  Name: [name] MRN: [id number] Date of Birth: Jan 24, 1956  Today's Date: 04/13/2016 PT Individual Time: 1002-1021 PT Individual Time Calculation (min): 19 min    Skilled Therapeutic Interventions/Progress Updates:     Pt found in room seated up in wheelchair with family present.  Pt with minimal responsive to PT greeting.  Attempted upper extremity cone transfer activity to attemp to help pt become alert for mobility activities, however, pt continually fell asleep.  Additionally trialed +2 sit to stand, however, pt did not attempt to participate.  Pt was returned to room, PA-C notified of pt decreased alertness.  Per PA-C, started new meds for mood which may be contributing to drowsiness.  Pt left in room in wheelchair with lapbelt donned.  Nursing notified of pt location.  Call bell and phone in reach. Therapy Documentation Precautions:  Precautions Precautions: Fall Restrictions Weight Bearing Restrictions: No General: PT Amount of Missed Time (min): 41 Minutes PT Missed Treatment Reason: Patient fatigue    See Function Navigator for Current Functional Status.   Therapy/Group: Individual Therapy  Shanera Meske Hilario Quarry 04/13/2016, 10:29 AM

## 2016-04-13 NOTE — Progress Notes (Signed)
Occupational Therapy Session Note  Patient Details  Name: NTHONY TRIPLETT MRN: [id number] Date of Birth: 1955/03/24  Today's Date: 04/13/2016 OT Individual Time: 0800-0900 OT Individual Time Calculation (min): 60 min    Short Term Goals: Week 2:  OT Short Term Goal 1 (Week 2): Pt will complete LB dressing with max assist of one caregiver OT Short Term Goal 2 (Week 2): Pt will complete sit > stand with mod assist to decrease burden of care with LB dressing/hygiene OT Short Term Goal 3 (Week 2): Pt will complete functional transfer with LRAD to [facility] with Mod A of 1 to decrease caregiver burden  Skilled Therapeutic Interventions/Progress Updates:    Pt asleep in bed upon arrival but easily aroused.  Pt required tot A +2 for bed mobility and max A for supine>sit EOB in preparation for transfer (Steady) to w/c.  Pt engaged in BADL retraining including bathing/dressing with sit<>stand from w/c at sink.  Pt required use of Steady for sit<>stand and standing to complete bathing periarea.  Pt did not have any clean clothing and donned hospital gowns.  Pt required max verbal cues to initiate tasks and for sequencing.  Pt continues to exhibit significant push/lean to his R when sitting in w/c and standing in Steady.  Pt remained in [place] w/c with QRB in place and dtr present.   Therapy Documentation Precautions:  Precautions Precautions: Fall Restrictions Weight Bearing Restrictions: No Pain:  Pt denied pain  See Function Navigator for Current Functional Status.   Therapy/Group: Individual Therapy  Leroy Libman 04/13/2016, 9:03 AM

## 2016-04-13 NOTE — Progress Notes (Signed)
Physical Therapy Session Note  Patient Details  Name: [name] MRN: [id number] Date of Birth: 1955-11-09  Today's Date: 04/12/2016 PT Individual Time:1420-1515   PT Individual Time Calculation: 50min   Short Term Goals: Week 2:  PT Short Term Goal 1 (Week 2): Pt will perform bed mobility with mod A using hospital bed functions. PT Short Term Goal 2 (Week 2): Patient will transfer sit <> stand with mod A 50% of available opportunities.  PT Short Term Goal 3 (Week 2): Pt will transfer bed <> wheelchair with mod A 25% of available opportunities.   Skilled Therapeutic Interventions/Progress Updates:   Pt received sitting in WC and agreeable to PT PT transported patient to [place] event to attempt to improve pt arousal. Pt able to open eyes to assess room and decline any therapeutic activity at party as well as decline food and drink. Transported to rehab gym. Sit<>stand transfer with RW x 4 with Mod assist from PT and max assist to maintain standing balance up to 30 seconds. Squat pivot transfer completed x 1 to L and R each with mod assist from PT to the L and max assist to the R. Attempted stand pivot transfer with RW x 1; pt unable to bear weigh through RLE to maintain standing balance and step LLE. Patient able to engage in conversation sitting EOB about family and maintain sitting balance x 10 minutes.  Pt returned to room and left sitting in [facility] with call bell in reach and NT present.      Therapy Documentation Precautions:  Precautions Precautions: Fall Restrictions Weight Bearing Restrictions: No Vital Signs: Therapy Vitals Temp: 98.1 F (36.7 C) Temp Source: Oral Pulse Rate: 78 Resp: 17 BP: (!) 158/69 Patient Position (if appropriate): Lying Oxygen Therapy SpO2: 97 % O2 Device: Not Delivered Pain:  0/10   See Function Navigator for Current Functional Status.   Therapy/Group: Individual Therapy  Lorie Phenix 04/13/2016, 6:20 AM

## 2016-04-13 NOTE — Progress Notes (Signed)
[place] PHYSICAL MEDICINE & REHABILITATION     PROGRESS NOTE  Subjective/Complaints:  Pt seen laying in bed this AM.  He is flat.  He slept well overnight.  He notes improvement with lidoderm patch.  ROS: Denies CP, SOB, N/V/D.  Objective: Vital Signs: Blood pressure (!) 158/69, pulse 78, temperature 98.1 F (36.7 C), temperature source Oral, resp. rate 17, height 5\' 9"  (1.753 m), weight 113.9 kg (251 lb), SpO2 97 %. No results found.  Recent Labs  04/11/16 0516 04/13/16 0402  WBC 11.9* 9.7  HGB 12.8* 12.0*  HCT 39.4 37.0*  PLT 306 321    Recent Labs  04/11/16 0516 04/13/16 0402  NA 138 140  K 4.0 3.7  CL 101 101  GLUCOSE 180* 115*  BUN 43* 45*  CREATININE 1.51* 1.63*  CALCIUM 8.8* 9.1   CBG (last 3)   Recent Labs  04/12/16 1601 04/12/16 2134 04/13/16 0641  GLUCAP 167* 86 140*    Wt Readings from Last 3 Encounters:  04/13/16 113.9 kg (251 lb)  03/28/16 111.1 kg (245 lb)  09/04/13 120.2 kg (265 lb)    Physical Exam:  BP (!) 158/69 (BP Location: Left Arm)   Pulse 78   Temp 98.1 F (36.7 C) (Oral)   Resp 17   Ht 5\' 9"  (1.753 m)   Wt 113.9 kg (251 lb)   SpO2 97%   BMI 37.07 kg/m  Constitutional: He appears well-developed. Obese. HENT: Normocephalic and atraumatic.  Eyes: EOM are normal. No discharge.  Cardiovascular: RRR.  No JVD. Respiratory: Effort normal and breath sounds normal.  GI: Soft. Bowel sounds are normal.  Musculoskeletal: He exhibits no edema. He exhibits no tenderness.  Neurological: He is alert and oriented.  Dysarthria.  Limited vision.  Poor insight and awareness.    Motor: 4+/5 RUE prox to distal. Ataxia (stable).  RLE: 4/+/5 proximal to distal  LLE 4+ /5 proximal to distal.  LUE/LLE proximal to distal 5/5.  Skin: Dry scaly skin with scabs b/l distal LE R>L. Right heel DTI, LLE 3rd distal digit blister. Psychiatric: Flat affect. Slowed behavior  Assessment/Plan: 1. Functional deficits secondary to left paramedian  pontine infarct which require 3+ hours per day of interdisciplinary therapy in a comprehensive inpatient rehab setting. Physiatrist is providing close team supervision and 24 hour management of active medical problems listed below. Physiatrist and rehab team continue to assess barriers to discharge/monitor patient progress toward functional and medical goals.  Function:  Bathing Bathing position   Position: Shower  Bathing parts Body parts bathed by patient: Right arm, Chest, Abdomen, Front perineal area, Right upper leg, Left upper leg Body parts bathed by helper: Right lower leg, Left lower leg, Back, Left arm  Bathing assist Assist Level: Touching or steadying assistance(Pt > 75%)      Upper Body Dressing/Undressing Upper body dressing   What is the patient wearing?: Hospital gown     Pull over shirt/dress - Perfomed by patient: Thread/unthread left sleeve, Put head through opening Pull over shirt/dress - Perfomed by helper: Thread/unthread right sleeve, Pull shirt over trunk Button up shirt - Perfomed by patient: Thread/unthread left sleeve, Thread/unthread right sleeve Button up shirt - Perfomed by helper: Pull shirt around back, Button/unbutton shirt    Upper body assist Assist Level: Touching or steadying assistance(Pt > 75%)      Lower Body Dressing/Undressing Lower body dressing   What is the patient wearing?: Non-skid slipper socks       Pants- Performed by helper:  Thread/unthread right pants leg, Thread/unthread left pants leg, Pull pants up/down, Fasten/unfasten pants   Non-skid slipper socks- Performed by helper: Don/doff left sock, Don/doff right sock       Shoes - Performed by helper: Don/doff right shoe, Don/doff left shoe, Fasten right, Fasten left   AFO - Performed by helper: Don/doff right AFO, Don/doff left AFO (ACE wraps)   TED Hose - Performed by helper: Don/doff right TED hose, Don/doff left TED hose  Lower body assist Assist for lower body dressing:  Touching or steadying assistance (Pt > 75%)      Toileting Toileting Toileting activity did not occur: No continent bowel/bladder event   Toileting steps completed by helper: Adjust clothing prior to toileting, Adjust clothing after toileting, Performs perineal hygiene Toileting Assistive Devices: Other (comment) (urinal)  Toileting assist Assist level: Touching or steadying assistance (Pt.75%)   Transfers Chair/bed transfer   Chair/bed transfer method: Squat pivot Chair/bed transfer assist level: Maximal assist (Pt 25 - 49%/lift and lower) Chair/bed transfer assistive device: Armrests Mechanical lift: Stedy   Locomotion Ambulation     Max distance: 15 ft Assist level: 2 helpers   Wheelchair   Type: Manual (TIS) Max wheelchair distance: 50 ft Assist Level: Touching or steadying assistance (Pt > 75%) (BLE only)  Cognition Comprehension Comprehension assist level: Understands basic 50 - 74% of the time/ requires cueing 25 - 49% of the time  Expression Expression assist level: Expresses basis less than 25% of the time/requires cueing >75% of the time.  Social Interaction Social Interaction assist level: Interacts appropriately 50 - 74% of the time - May be physically or verbally inappropriate.  Problem Solving Problem solving assist level: Solves basic 25 - 49% of the time - needs direction more than half the time to initiate, plan or complete simple activities  Memory Memory assist level: Recognizes or recalls 25 - 49% of the time/requires cueing 50 - 75% of the time    Medical Problem List and Plan: 1.  Right hemiparesis and functional deficits secondary to left paramedian pontine infarct on 1/31  Cont CIR  Baclofen started for spasms d/ced on 2/14 due to lethargy 2. H/o DVT /Anticoagulation: Pharmaceutical: Xarelto 3. Pain Management:   Lidoderm patch started due to therapy reports of pain - patient states improved, however, ?reliability.  4. Mood: LCSW to follow for  evaluation and support.   Fluoxetine started 2/15 5. Neuropsych: This patient is not yet capable of making decisions on his own behalf.  Discussed possibility of Neuropsych eval, however, pt unlikely to cooperate 6. Skin/Wound Care: Routine pressure relief measures. Eucerin to b/l LE.  Prevalon boots ordered for DTI 7. Fluids/Electrolytes/Nutrition: Monitor I/Os  Intake has been variable affecting CBGs  Remeron added 2/15, will consider further increase 8. HTN: Monitor BP.   Continue coreg, cozaar, Bidil and lasix.     Chlorthalidone increased on 2/8, again on 2/12, increased again on 2/15  Will likely require further increase 9. T2DM with retinopathy and neuropathy: Blood sugars poorly controlled with A1C- 11.0. Consult RD to educated on [place]. Monitor BS ac/hs.   Novolog 70/30 decreased to 30 daily  Novolog 70/30 30 Qhs  Novalog decreased to 2U TID  qHS snack added 2/14  Labile, cont to monitor  Monitor with increased mobility 10. CAD/ Acute systolic CHF/NICM: compensated. Check daily weights and monitor for signs of overload. Heart healthy/low salt diet. Will need perfusion study on outpatient basis per cardiology. Cont meds Filed Weights   04/10/16 0602 04/11/16  0541 04/13/16 0422  Weight: 108.8 kg (239 lb 13.8 oz) 107.4 kg (236 lb 12.4 oz) 113.9 kg (251 lb)   Clinically stable 11. Morbid Obesity: BMI - 36. Encourage appropriate diet, weight loss and exercise.  12. CKD with AKI:   Cr. 1.63 on 2/15  Encourage fluids  IVF x24 hours ordered 2/14  Trending up due to poor fluid intake, IVF started qhs 13. Hypokalemia:   Increased K+ supplement 2/9  K+ 3.7 on 2/16  Cont to monitor 14. ABLA  Hb 12.0 on 2/16  Cont to monitor 15. Acute lower UTI  UA+, Ucx with Pseudomonas and enterococcus  Spoke with pharmacy, macrobid changed to Cipro 2/12-2/18  LOS (Days) 10 A FACE TO FACE EVALUATION WAS PERFORMED  Ankit Lorie Phenix 04/13/2016 8:20 AM

## 2016-04-13 NOTE — Progress Notes (Signed)
Speech Language Pathology Daily Session Note  Patient Details  Name: [name] MRN: [id number] Date of Birth: May 29, 1955  Today's Date: 04/13/2016 SLP Individual Time: 1115-1200 SLP Individual Time Calculation (min): 45 min  Short Term Goals: Week 2: SLP Short Term Goal 1 (Week 2): Pt will demonstrate sustained attention to basic, familiar tasks for 5 minutes with Max A cues for redirection.  SLP Short Term Goal 2 (Week 2): Given choice of orientation information, pt will select correct answer with Max A multimodal cues.  SLP Short Term Goal 3 (Week 2): Pt will utilize speech intelligibility strategies with Max A verbal cues to achieve ~>75% intelligibility at the simple conversation level.  SLP Short Term Goal 4 (Week 2): Given Max A multimodal cues, pt will complete functional 1 step basic, familiar directions.  SLP Short Term Goal 5 (Week 2): Pt will consume least restrictive diet without overt s/s of aspiration with Min A cues for use of compensatory swallow strategies.   Skilled Therapeutic Interventions: Skilled treatment session focused on dysphagia and cognition goals. SLP facilitated session by providing Max A multimodal cues (cold facial washcloth, tactile stimulation) for initial alertness to session. With hand over hand, pt able to hold sugar free ice cream with SLP feeding pt. Pt consumed Glucerna and ice cream as well as 4 oz water thin cup without overt s/s of aspiration. Pt completed 1 step directions with transfer from chair to bed with Max A cues. Pt able to look into mirror and attempt to right himself while sitting and standing in [name] with Max a multimodal cues.Education provided to pt, his daughter and nursing staff on NO STRAWS.  Pt was left in bed with all needs within reach and nursing in room. Continue current plan of care.      Function:  Eating Eating   Modified Consistency Diet: No Eating Assist Level: Set up assist for;Helper scoops food on utensil;Helper  brings food to mouth;Hand over hand assist;Help managing cup/glass;Help with picking up utensils   Eating Set Up Assist For: Opening containers Helper Scoops Food on Utensil: Every scoop Helper Brings Food to Mouth: Every scoop   Cognition Comprehension Comprehension assist level: Understands basic 50 - 74% of the time/ requires cueing 25 - 49% of the time  Expression   Expression assist level: Expresses basis less than 25% of the time/requires cueing >75% of the time.  Social Interaction Social Interaction assist level: Interacts appropriately 25 - 49% of time - Needs frequent redirection.  Problem Solving Problem solving assist level: Solves basic 25 - 49% of the time - needs direction more than half the time to initiate, plan or complete simple activities  Memory Memory assist level: Recognizes or recalls 25 - 49% of the time/requires cueing 50 - 75% of the time    Pain    Therapy/Group: Individual Therapy  [name] B. Rutherford Nail, M.S., CCC-SLP Speech-Language Pathologist  [name] 04/13/2016, 12:07 PM

## 2016-04-14 ENCOUNTER — Inpatient Hospital Stay (HOSPITAL_COMMUNITY): Payer: Medicare Other | Admitting: Occupational Therapy

## 2016-04-14 LAB — BASIC METABOLIC PANEL
ANION GAP: 10 (ref 5–15)
BUN: 41 mg/dL — AB (ref 6–20)
CO2: 28 mmol/L (ref 22–32)
Calcium: 9.1 mg/dL (ref 8.9–10.3)
Chloride: 103 mmol/L (ref 101–111)
Creatinine, Ser: 1.39 mg/dL — ABNORMAL HIGH (ref 0.61–1.24)
GFR calc Af Amer: >60 mL/min (ref >60)
GFR calc non Af Amer: 54 mL/min — ABNORMAL LOW (ref >60)
GLUCOSE: 88 mg/dL (ref 65–99)
POTASSIUM: 3.6 mmol/L (ref 3.5–5.1)
Sodium: 141 mmol/L (ref 135–145)

## 2016-04-14 LAB — GLUCOSE, CAPILLARY
GLUCOSE-CAPILLARY: 259 mg/dL — AB (ref 65–99)
GLUCOSE-CAPILLARY: 203 mg/dL — AB (ref 65–99)
Glucose-Capillary: 158 mg/dL — ABNORMAL HIGH (ref 65–99)
Glucose-Capillary: 198 mg/dL — ABNORMAL HIGH (ref 65–99)

## 2016-04-14 MED ORDER — POTASSIUM CHLORIDE CRYS ER 20 MEQ PO TBCR
40.0000 meq | EXTENDED_RELEASE_TABLET | Freq: Two times a day (BID) | ORAL | Status: DC
  Administered 2016-04-14 – 2016-04-19 (×11): 40 meq via ORAL
  Filled 2016-04-14 (×11): qty 2

## 2016-04-14 NOTE — Progress Notes (Signed)
[place] PHYSICAL MEDICINE & REHABILITATION     PROGRESS NOTE  Subjective/Complaints:  In bed. NT feeding him. Grimacing as he's eating food. Nods yes when asked if he had problems sleeping  ROS: Limited due cognitive/behavioral .  Objective: Vital Signs: Blood pressure (!) 146/61, pulse 65, temperature 98 F (36.7 C), temperature source Oral, resp. rate 18, height 5\' 9"  (1.753 m), weight 112.8 kg (248 lb 10.9 oz), SpO2 95 %. No results found.  Recent Labs  04/13/16 0402  WBC 9.7  HGB 12.0*  HCT 37.0*  PLT 321    Recent Labs  04/13/16 0402 04/14/16 0344  NA 140 141  K 3.7 3.6  CL 101 103  GLUCOSE 115* 88  BUN 45* 41*  CREATININE 1.63* 1.39*  CALCIUM 9.1 9.1   CBG (last 3)   Recent Labs  04/13/16 1639 04/13/16 2047 04/14/16 0712  GLUCAP 146* 86 158*    Wt Readings from Last 3 Encounters:  04/14/16 112.8 kg (248 lb 10.9 oz)  03/28/16 111.1 kg (245 lb)  09/04/13 120.2 kg (265 lb)    Physical Exam:  BP (!) 146/61 (BP Location: Left Arm)   Pulse 65   Temp 98 F (36.7 C) (Oral)   Resp 18   Ht 5\' 9"  (1.753 m)   Wt 112.8 kg (248 lb 10.9 oz)   SpO2 95%   BMI 36.72 kg/m  Constitutional: He appears well-developed. Obese. HENT: Normocephalic and atraumatic.  Eyes: EOM are normal. No discharge.  Cardiovascular: RRR Respiratory: Effort normal and breath sounds normal.  GI: Soft. Bowel sounds are normal.  Musculoskeletal: He exhibits no edema. He exhibits no tenderness.  Neurological: He is alert.. Dysarthric  Limited vision.  Poor insight and awareness.    Motor: 4+/5 RUE prox to distal. Ataxia (stable).  RLE: 4/+/5 proximal to distal  LLE 4+ /5 proximal to distal.  LUE/LLE proximal to distal 5/5.  Skin: Dry scaly skin with scabs b/l distal LE R>L. Right heel DTI, LLE 3rd distal digit blister. Psychiatric: Flat affect. Delayed processing  Assessment/Plan: 1. Functional deficits secondary to left paramedian pontine infarct which require 3+ hours  per day of interdisciplinary therapy in a comprehensive inpatient rehab setting. Physiatrist is providing close team supervision and 24 hour management of active medical problems listed below. Physiatrist and rehab team continue to assess barriers to discharge/monitor patient progress toward functional and medical goals.  Function:  Bathing Bathing position   Position: Wheelchair/chair at sink  Bathing parts Body parts bathed by patient: Right arm, Chest, Abdomen, Front perineal area, Right upper leg, Left upper leg Body parts bathed by helper: Left arm, Buttocks, Right lower leg, Left lower leg, Back  Bathing assist Assist Level: Touching or steadying assistance(Pt > 75%)      Upper Body Dressing/Undressing Upper body dressing   What is the patient wearing?: Hospital gown     Pull over shirt/dress - Perfomed by patient: Thread/unthread left sleeve, Put head through opening Pull over shirt/dress - Perfomed by helper: Thread/unthread right sleeve, Pull shirt over trunk Button up shirt - Perfomed by patient: Thread/unthread left sleeve, Thread/unthread right sleeve Button up shirt - Perfomed by helper: Pull shirt around back, Button/unbutton shirt    Upper body assist Assist Level: Touching or steadying assistance(Pt > 75%)      Lower Body Dressing/Undressing Lower body dressing   What is the patient wearing?: Hospital Gown       Pants- Performed by helper: Thread/unthread right pants leg, Thread/unthread left pants leg,  Pull pants up/down, Fasten/unfasten pants   Non-skid slipper socks- Performed by helper: Don/doff left sock, Don/doff right sock       Shoes - Performed by helper: Don/doff right shoe, Don/doff left shoe, Fasten right, Fasten left   AFO - Performed by helper: Don/doff right AFO, Don/doff left AFO (ACE wraps)   TED Hose - Performed by helper: Don/doff right TED hose, Don/doff left TED hose  Lower body assist Assist for lower body dressing: Touching or  steadying assistance (Pt > 75%)      Toileting Toileting Toileting activity did not occur: No continent bowel/bladder event   Toileting steps completed by helper: Adjust clothing prior to toileting, Adjust clothing after toileting, Performs perineal hygiene Toileting Assistive Devices: Other (comment) (urinal)  Toileting assist Assist level: Touching or steadying assistance (Pt.75%)   Transfers Chair/bed transfer   Chair/bed transfer method: Squat pivot Chair/bed transfer assist level: 2 helpers Chair/bed transfer assistive device: Armrests Mechanical lift: Ecologist     Max distance: 15 ft Assist level: 2 helpers   Wheelchair   Type: Manual Max wheelchair distance: 50 ft Assist Level: Dependent (Pt equals 0%)  Cognition Comprehension Comprehension assist level: Understands basic 50 - 74% of the time/ requires cueing 25 - 49% of the time  Expression Expression assist level: Expresses basis less than 25% of the time/requires cueing >75% of the time.  Social Interaction Social Interaction assist level: Interacts appropriately 25 - 49% of time - Needs frequent redirection.  Problem Solving Problem solving assist level: Solves basic 25 - 49% of the time - needs direction more than half the time to initiate, plan or complete simple activities  Memory Memory assist level: Recognizes or recalls 25 - 49% of the time/requires cueing 50 - 75% of the time    Medical Problem List and Plan: 1.  Right hemiparesis and functional deficits secondary to left paramedian pontine infarct on 1/31  Cont CIR  Baclofen started for spasms d/ced on 2/14 due to lethargy 2. H/o DVT /Anticoagulation: Pharmaceutical: Xarelto 3. Pain Management:   Lidoderm patch started due to therapy reports of pain - patient states improved, however, ?reliability.  4. Mood: LCSW to follow for evaluation and support.   Fluoxetine started 2/15  -team to continue providing egosupport  -more alert off  baclofen 5. Neuropsych: This patient is not yet capable of making decisions on his own behalf.  Discussed possibility of Neuropsych eval, however, pt unlikely to cooperate 6. Skin/Wound Care: Routine pressure relief measures. Eucerin to b/l LE.  Prevalon boots ordered for DTI 7. Fluids/Electrolytes/Nutrition: Monitor I/Os  Intake has been variable affecting CBGs  Remeron added 2/15, will consider further increase 8. HTN: Monitor BP.   Continue coreg, cozaar, Bidil and lasix.     Chlorthalidone increased on 2/8, again on 2/12, increased again on 2/15  BUN/CR holding steady to improved 9. T2DM with retinopathy and neuropathy: Blood sugars poorly controlled with A1C- 11.0. Consult RD to educated on [place]. Monitor BS ac/hs.   Novolog 70/30 decreased to 30 daily  Novolog 70/30 30 Qhs  Novalog decreased to 2U TID  qHS snack added 2/14  Labile, cont to monitor  Monitor with increased mobility 10. CAD/ Acute systolic CHF/NICM: compensated. Check daily weights and monitor for signs of overload. Heart healthy/low salt diet. Will need perfusion study on outpatient basis per cardiology. Cont meds Filed Weights   04/11/16 0541 04/13/16 0422 04/14/16 0630  Weight: 107.4 kg (236 lb 12.4 oz) 113.9 kg (  251 lb) 112.8 kg (248 lb 10.9 oz)   Clinically stable 11. Morbid Obesity: BMI - 36. Encourage appropriate diet, weight loss and exercise.  12. CKD with AKI:   Cr. 1.63 on 2/15, 1.39 today  Encourage fluids  IVF x24 hours ordered 2/14  IVF started qhs 13. Hypokalemia:   Increased K+ supplement 2/9  K+ 3.6 on 2/17  Continue supplement 14. ABLA  Hb 12.0 on 2/16  Cont to monitor 15. Acute lower UTI  UA+, Ucx with Pseudomonas and enterococcus  Spoke with pharmacy, macrobid changed to Cipro 2/12-2/18  LOS (Days) 11 A FACE TO FACE EVALUATION WAS PERFORMED  [name] T 04/14/2016 8:15 AM

## 2016-04-14 NOTE — Progress Notes (Signed)
Occupational Therapy Session Note  Patient Details  Name: [name] MRN: [id number] Date of Birth: [birth year]  Today's Date: 04/14/2016 OT Individual Time: 1300-1356 OT Individual Time Calculation (min): 56 min    Short Term Goals: Week 2:  OT Short Term Goal 1 (Week 2): Pt will complete LB dressing with max assist of one caregiver OT Short Term Goal 2 (Week 2): Pt will complete sit > stand with mod assist to decrease burden of care with LB dressing/hygiene OT Short Term Goal 3 (Week 2): Pt will complete functional transfer with LRAD to [facility] with Mod A of 1 to decrease caregiver burden  Skilled Therapeutic Interventions/Progress Updates:    Pt worked on dressing sitting EOB during session.  Max demonstrational cueing with mod assist to transfer from supine to sit EOB.  Increased lean to the right in sitting, requiring min assist to correct.  He was able to assist with donning shirt 50% of task after it was oriented correctly.  He needed mod assist for RUE use to help push the shirt up his left arm.  He maintained eyes open for 60% of session when engaged in dressing tasks.  Pt stated he needed to use the urinal before attempting to donn his pants but was unable to hold it long enough to get the urinal.  Utilized Steady for sit to stand with mod assist in order to clean peri area and donn new brief.  In standing pt exhibits increased right sided and forward lean, needing max assist to self correct.  Total assist needed for peri hygiene and for donning new brief.  Transferred to wheelchair via Steady to work on donning pants.  He needed assist with orientation again as well and therapist had to assist with donning the RLE, but he was able to donn the left.  Max assist for sit to stand to pull pants over hips.  Pt left in tilt in space wheelchair at end of session with call button in reach and safety belt in place.    Therapy Documentation Precautions:  Precautions Precautions:  Fall Restrictions Weight Bearing Restrictions: No  Pain: Pain Assessment Pain Assessment: No/denies pain ADL: See Function Navigator for Current Functional Status.   Therapy/Group: Individual Therapy  Tsuyako Jolley OTR/L 04/14/2016, 3:55 PM

## 2016-04-14 NOTE — Progress Notes (Signed)
Nursing staff fed patient, patient consumed meals 50-90%. Patient will state he does not want anything to eat, but if feeder take the food to his mouth he will eat. Held coreg r/t bp @ 1800. Patient notes when he has to void, notes urgency. Patient was OOB for apprx 5 hours today. He continues IV fluid, no noted of fluid overload. Staff will continue to monitor and meet needs.

## 2016-04-15 LAB — GLUCOSE, CAPILLARY
GLUCOSE-CAPILLARY: 200 mg/dL — AB (ref 65–99)
GLUCOSE-CAPILLARY: 177 mg/dL — AB (ref 65–99)
Glucose-Capillary: 120 mg/dL — ABNORMAL HIGH (ref 65–99)
Glucose-Capillary: 291 mg/dL — ABNORMAL HIGH (ref 65–99)

## 2016-04-15 NOTE — Plan of Care (Signed)
Problem: RH BLADDER ELIMINATION Goal: RH STG MANAGE BLADDER WITH ASSISTANCE STG Manage Bladder With mod Assistance   Outcome: Not Progressing Patient using condom cath at night

## 2016-04-15 NOTE — Progress Notes (Signed)
[place] PHYSICAL MEDICINE & REHABILITATION     PROGRESS NOTE  Subjective/Complaints:  Lying in bed. No issues overnight.    ROS: Limited due cognitive/behavioral  .  Objective: Vital Signs: Blood pressure (!) 165/68, pulse 61, temperature 97.5 F (36.4 C), temperature source Oral, resp. rate 20, height 5\' 9"  (1.753 m), weight 114.3 kg (252 lb), SpO2 98 %. No results found.  Recent Labs  04/13/16 0402  WBC 9.7  HGB 12.0*  HCT 37.0*  PLT 321    Recent Labs  04/13/16 0402 04/14/16 0344  NA 140 141  K 3.7 3.6  CL 101 103  GLUCOSE 115* 88  BUN 45* 41*  CREATININE 1.63* 1.39*  CALCIUM 9.1 9.1   CBG (last 3)   Recent Labs  04/14/16 1700 04/14/16 2040 04/15/16 0633  GLUCAP 259* 203* 120*    Wt Readings from Last 3 Encounters:  04/15/16 114.3 kg (252 lb)  03/28/16 111.1 kg (245 lb)  09/04/13 120.2 kg (265 lb)    Physical Exam:  BP (!) 165/68 (BP Location: Right Arm)   Pulse 61   Temp 97.5 F (36.4 C) (Oral)   Resp 20   Ht 5\' 9"  (1.753 m)   Wt 114.3 kg (252 lb)   SpO2 98%   BMI 37.21 kg/m  Constitutional: He appears well-developed. Obese. HENT: Normocephalic and atraumatic.  Eyes: EOM are normal. No discharge.  Cardiovascular: RRR Respiratory: Effort normal and breath sounds normal.  GI: Soft. Bowel sounds are normal.  Musculoskeletal: He exhibits no edema. He exhibits no tenderness.  Neurological: He is slow to arouse Dysarthric  Limited vision.  Poor insight and awareness.    Motor: 4+/5 RUE prox to distal. Ataxia (stable).  RLE: 4/+/5 proximal to distal  LLE 4+ /5 proximal to distal.  LUE/LLE proximal to distal 5/5.  Skin: Dry scaly skin with scabs b/l distal LE R>L. Right heel DTI, LLE 3rd distal digit blister/breakdown. Psychiatric: Flat affect. Delayed processing.   Assessment/Plan: 1. Functional deficits secondary to left paramedian pontine infarct which require 3+ hours per day of interdisciplinary therapy in a comprehensive  inpatient rehab setting. Physiatrist is providing close team supervision and 24 hour management of active medical problems listed below. Physiatrist and rehab team continue to assess barriers to discharge/monitor patient progress toward functional and medical goals.  Function:  Bathing Bathing position   Position: Wheelchair/chair at sink  Bathing parts Body parts bathed by patient: Right arm, Chest, Abdomen, Front perineal area, Right upper leg, Left upper leg Body parts bathed by helper: Left arm, Buttocks, Right lower leg, Left lower leg, Back  Bathing assist Assist Level: Touching or steadying assistance(Pt > 75%)      Upper Body Dressing/Undressing Upper body dressing   What is the patient wearing?: Pull over shirt/dress     Pull over shirt/dress - Perfomed by patient: Pull shirt over trunk, Put head through opening Pull over shirt/dress - Perfomed by helper: Thread/unthread right sleeve, Thread/unthread left sleeve Button up shirt - Perfomed by patient: Thread/unthread left sleeve, Thread/unthread right sleeve Button up shirt - Perfomed by helper: Pull shirt around back, Button/unbutton shirt    Upper body assist Assist Level: Touching or steadying assistance(Pt > 75%)      Lower Body Dressing/Undressing Lower body dressing   What is the patient wearing?: Pants       Pants- Performed by helper: Thread/unthread left pants leg, Thread/unthread right pants leg, Pull pants up/down   Non-skid slipper socks- Performed by helper: Don/doff left  sock, Don/doff right sock       Shoes - Performed by helper: Don/doff right shoe, Don/doff left shoe, Fasten right, Fasten left   AFO - Performed by helper: Don/doff right AFO, Don/doff left AFO (ACE wraps)   TED Hose - Performed by helper: Don/doff right TED hose, Don/doff left TED hose  Lower body assist Assist for lower body dressing: Touching or steadying assistance (Pt > 75%)      Toileting Toileting Toileting activity did  not occur: No continent bowel/bladder event   Toileting steps completed by helper: Adjust clothing prior to toileting, Adjust clothing after toileting, Performs perineal hygiene Toileting Assistive Devices: Other (comment) (urinal)  Toileting assist Assist level: Touching or steadying assistance (Pt.75%)   Transfers Chair/bed transfer   Chair/bed transfer method: Squat pivot Chair/bed transfer assist level: 2 helpers Chair/bed transfer assistive device: Mechanical lift Mechanical lift: Ecologist     Max distance: 15 ft Assist level: 2 helpers   Wheelchair   Type: Manual Max wheelchair distance: 50 ft Assist Level: Dependent (Pt equals 0%)  Cognition Comprehension Comprehension assist level: Understands basic 50 - 74% of the time/ requires cueing 25 - 49% of the time  Expression Expression assist level: Expresses basic 25 - 49% of the time/requires cueing 50 - 75% of the time. Uses single words/gestures.  Social Interaction Social Interaction assist level: Interacts appropriately 25 - 49% of time - Needs frequent redirection.  Problem Solving Problem solving assist level: Solves basic 25 - 49% of the time - needs direction more than half the time to initiate, plan or complete simple activities  Memory Memory assist level: Recognizes or recalls 25 - 49% of the time/requires cueing 50 - 75% of the time    Medical Problem List and Plan: 1.  Right hemiparesis and functional deficits secondary to left paramedian pontine infarct on 1/31  Cont CIR  Baclofen started for spasms d/ced on 2/14 due to lethargy with some improvement 2. H/o DVT /Anticoagulation: Pharmaceutical: Xarelto 3. Pain Management:   Lidoderm patch started due to therapy reports of pain - patient states improved, however, ?reliability.  4. Mood: LCSW to follow for evaluation and support.   Fluoxetine started 2/15  -team to continue providing egosupport  -more alert off baclofen 5. Neuropsych:  This patient is not yet capable of making decisions on his own behalf.  Discussed possibility of Neuropsych eval, however, pt unlikely to cooperate 6. Skin/Wound Care: Routine pressure relief measures. Eucerin to b/l LE.  Prevalon boots ordered for DTI 7. Fluids/Electrolytes/Nutrition: Monitor I/Os  Intake has been variable affecting CBGs  Remeron added 2/15, will consider further increase 8. HTN: Monitor BP.   Continue coreg, cozaar, Bidil and lasix.     Chlorthalidone increased on 2/8, again on 2/12, increased again on 2/15  BUN/CR holding steady to improved 9. T2DM with retinopathy and neuropathy: Blood sugars poorly controlled with A1C- 11.0. Consult RD to educated on [address]. Monitor BS ac/hs.      Novolog 70/30 30 Qhs  Novalog decreased to 2U TID  qHS snack added 2/14  If he eats on a consistent basis, can increase basal insulin again    10. CAD/ Acute systolic CHF/NICM: compensated. Check daily weights and monitor for signs of overload. Heart healthy/low salt diet. Will need perfusion study on outpatient basis per cardiology. Cont meds Filed Weights   04/13/16 0422 04/14/16 0630 04/15/16 0508  Weight: 113.9 kg (251 lb) 112.8 kg (248 lb 10.9 oz) 114.3 kg (  252 lb)   Clinically stable 11. Morbid Obesity: BMI - 36. Encourage appropriate diet, weight loss and exercise.  12. CKD with AKI:   Cr. 1.63 on 2/15, 1.39 today  Encourage fluids  Continue IVF at 50cc/hr 13. Hypokalemia:   Increased K+ supplement 2/9  K+ 3.6 on 2/17  Continue supplement 14. ABLA  Hb 12.0 on 2/16  Cont to monitor 15. Acute lower UTI  UA+, Ucx with Pseudomonas and enterococcus  Spoke with pharmacy, macrobid changed to Cipro 2/12-2/18  LOS (Days) 12 A FACE TO FACE EVALUATION WAS PERFORMED  SWARTZ,ZACHARY T 04/15/2016 8:13 AM

## 2016-04-16 ENCOUNTER — Inpatient Hospital Stay (HOSPITAL_COMMUNITY): Payer: Medicare Other | Admitting: Speech Pathology

## 2016-04-16 ENCOUNTER — Inpatient Hospital Stay (HOSPITAL_COMMUNITY): Payer: Medicare Other | Admitting: Occupational Therapy

## 2016-04-16 ENCOUNTER — Inpatient Hospital Stay (HOSPITAL_COMMUNITY): Payer: Medicare Other | Admitting: *Deleted

## 2016-04-16 ENCOUNTER — Ambulatory Visit (HOSPITAL_COMMUNITY): Payer: Medicare Other | Admitting: Psychology

## 2016-04-16 DIAGNOSIS — I63432 Cerebral infarction due to embolism of left posterior cerebral artery: Secondary | ICD-10-CM | POA: Insufficient documentation

## 2016-04-16 DIAGNOSIS — I639 Cerebral infarction, unspecified: Secondary | ICD-10-CM

## 2016-04-16 DIAGNOSIS — R5383 Other fatigue: Secondary | ICD-10-CM

## 2016-04-16 DIAGNOSIS — F4323 Adjustment disorder with mixed anxiety and depressed mood: Secondary | ICD-10-CM

## 2016-04-16 LAB — BASIC METABOLIC PANEL
Anion gap: 9 (ref 5–15)
BUN: 37 mg/dL — ABNORMAL HIGH (ref 6–20)
CHLORIDE: 108 mmol/L (ref 101–111)
CO2: 27 mmol/L (ref 22–32)
CREATININE: 1.47 mg/dL — AB (ref 0.61–1.24)
Calcium: 9.2 mg/dL (ref 8.9–10.3)
GFR calc non Af Amer: 50 mL/min — ABNORMAL LOW (ref >60)
GFR, EST AFRICAN AMERICAN: 58 mL/min — AB (ref >60)
Glucose, Bld: 102 mg/dL — ABNORMAL HIGH (ref 65–99)
Potassium: 3.5 mmol/L (ref 3.5–5.1)
Sodium: 144 mmol/L (ref 135–145)

## 2016-04-16 LAB — GLUCOSE, CAPILLARY
GLUCOSE-CAPILLARY: 174 mg/dL — AB (ref 65–99)
Glucose-Capillary: 139 mg/dL — ABNORMAL HIGH (ref 65–99)
Glucose-Capillary: 226 mg/dL — ABNORMAL HIGH (ref 65–99)
Glucose-Capillary: 196 mg/dL — ABNORMAL HIGH (ref 65–99)

## 2016-04-16 MED ORDER — AMANTADINE HCL 100 MG PO CAPS
100.0000 mg | ORAL_CAPSULE | Freq: Two times a day (BID) | ORAL | Status: DC
  Administered 2016-04-16 – 2016-04-17 (×3): 100 mg via ORAL
  Filled 2016-04-16 (×4): qty 1

## 2016-04-16 NOTE — Plan of Care (Signed)
Problem: RH BLADDER ELIMINATION Goal: RH STG MANAGE BLADDER WITH ASSISTANCE STG Manage Bladder With mod Assistance   Outcome: Not Progressing Patient is I&O cath

## 2016-04-16 NOTE — Progress Notes (Addendum)
[name] PHYSICAL MEDICINE & REHABILITATION     PROGRESS NOTE  Subjective/Complaints:  Pt seen laying in bed this AM.  He is sleepy and only keeps eyes open briefly, but able to follow commands. Spoke with PA, nursing regarding alertness.  ROS: Denies CP, SOB, N/V/D.Marland Kitchen  Objective: Vital Signs: Blood pressure (!) 144/61, pulse 65, temperature 98.5 F (36.9 C), temperature source Oral, resp. rate 18, height 5\' 9"  (1.753 m), weight 115.1 kg (253 lb 12 oz), SpO2 97 %. No results found. No results for input(s): WBC, HGB, HCT, PLT in the last 72 hours.  Recent Labs  04/14/16 0344 04/16/16 0255  NA 141 144  K 3.6 3.5  CL 103 108  GLUCOSE 88 102*  BUN 41* 37*  CREATININE 1.39* 1.47*  CALCIUM 9.1 9.2   CBG (last 3)   Recent Labs  04/15/16 1622 04/15/16 2024 04/16/16 0659  GLUCAP 291* 177* 139*    Wt Readings from Last 3 Encounters:  04/16/16 115.1 kg (253 lb 12 oz)  03/28/16 111.1 kg (245 lb)  09/04/13 120.2 kg (265 lb)    Physical Exam:  BP (!) 144/61 (BP Location: Right Arm)   Pulse 65   Temp 98.5 F (36.9 C) (Oral)   Resp 18   Ht 5\' 9"  (1.753 m)   Wt 115.1 kg (253 lb 12 oz)   SpO2 97%   BMI 37.47 kg/m  Constitutional: He appears well-developed. Obese. HENT: Normocephalic and atraumatic.  Eyes: EOM are normal. No discharge.  Cardiovascular: RRR. No JVD. Respiratory: Effort normal and breath sounds normal.  GI: Soft. Bowel sounds are normal.  Musculoskeletal: He exhibits no edema. He exhibits no tenderness.  Neurological: Keeps eyes closed. Dysarthric  Limited vision.  Poor insight and awareness.    Motor: 4+/5 RUE prox to distal. Ataxia (slightly improved).  RLE: 4/+/5 proximal to distal  LLE 4+ /5 proximal to distal.  LUE/LLE proximal to distal 5/5.  Skin: Dry scaly skin with scabs b/l distal LE R>L. Right heel DTI, LLE 3rd distal digit blister/breakdown. Psychiatric: Flat affect. Limited initiation.   Assessment/Plan: 1. Functional deficits  secondary to left paramedian pontine infarct which require 3+ hours per day of interdisciplinary therapy in a comprehensive inpatient rehab setting. Physiatrist is providing close team supervision and 24 hour management of active medical problems listed below. Physiatrist and rehab team continue to assess barriers to discharge/monitor patient progress toward functional and medical goals.  Function:  Bathing Bathing position   Position: Wheelchair/chair at sink  Bathing parts Body parts bathed by patient: Right arm, Chest, Abdomen, Front perineal area, Right upper leg, Left upper leg Body parts bathed by helper: Left arm, Buttocks, Right lower leg, Left lower leg, Back  Bathing assist Assist Level: Touching or steadying assistance(Pt > 75%)      Upper Body Dressing/Undressing Upper body dressing   What is the patient wearing?: Pull over shirt/dress     Pull over shirt/dress - Perfomed by patient: Pull shirt over trunk, Put head through opening Pull over shirt/dress - Perfomed by helper: Thread/unthread right sleeve, Thread/unthread left sleeve Button up shirt - Perfomed by patient: Thread/unthread left sleeve, Thread/unthread right sleeve Button up shirt - Perfomed by helper: Pull shirt around back, Button/unbutton shirt    Upper body assist Assist Level: Touching or steadying assistance(Pt > 75%)      Lower Body Dressing/Undressing Lower body dressing   What is the patient wearing?: Pants       Pants- Performed by helper: Thread/unthread left  pants leg, Thread/unthread right pants leg, Pull pants up/down   Non-skid slipper socks- Performed by helper: Don/doff left sock, Don/doff right sock       Shoes - Performed by helper: Don/doff right shoe, Don/doff left shoe, Fasten right, Fasten left   AFO - Performed by helper: Don/doff right AFO, Don/doff left AFO (ACE wraps)   TED Hose - Performed by helper: Don/doff right TED hose, Don/doff left TED hose  Lower body assist Assist  for lower body dressing: Touching or steadying assistance (Pt > 75%)      Toileting Toileting Toileting activity did not occur: No continent bowel/bladder event   Toileting steps completed by helper: Adjust clothing prior to toileting, Performs perineal hygiene, Adjust clothing after toileting Toileting Assistive Devices: Other (comment) (urinal)  Toileting assist Assist level: Touching or steadying assistance (Pt.75%)   Transfers Chair/bed transfer   Chair/bed transfer method: Squat pivot Chair/bed transfer assist level: 2 helpers Chair/bed transfer assistive device: Mechanical lift Mechanical lift: Ecologist     Max distance: 15 ft Assist level: 2 helpers   Wheelchair   Type: Manual Max wheelchair distance: 50 ft Assist Level: Dependent (Pt equals 0%)  Cognition Comprehension Comprehension assist level: Understands basic 50 - 74% of the time/ requires cueing 25 - 49% of the time  Expression Expression assist level: Expresses basic 25 - 49% of the time/requires cueing 50 - 75% of the time. Uses single words/gestures.  Social Interaction Social Interaction assist level: Interacts appropriately 25 - 49% of time - Needs frequent redirection.  Problem Solving Problem solving assist level: Solves basic 25 - 49% of the time - needs direction more than half the time to initiate, plan or complete simple activities  Memory Memory assist level: Recognizes or recalls 25 - 49% of the time/requires cueing 50 - 75% of the time    Medical Problem List and Plan: 1.  Right hemiparesis and functional deficits secondary to left paramedian pontine infarct on 1/31  Cont CIR  Baclofen started for spasms d/ced on 2/14 due to lethargy with some improvement  Weekend notes reviewed with participation at times with therapeis 2. H/o DVT /Anticoagulation: Pharmaceutical: Xarelto 3. Pain Management:   Lidoderm patch started due to therapy reports of pain - patient states improved,  however, ?reliability.  4. Mood: LCSW to follow for evaluation and support.   Fluoxetine started 2/15  Trial amantadine on 2/19 5. Neuropsych: This patient is not yet capable of making decisions on his own behalf.  Neuropsych eval today 6. Skin/Wound Care: Routine pressure relief measures. Eucerin to b/l LE.  Prevalon boots ordered for DTI 7. Fluids/Electrolytes/Nutrition: Monitor I/Os  Intake has been variable affecting CBGs  Remeron added 2/15, will consider further increase 8. HTN: Monitor BP.   Continue coreg, cozaar, Bidil and lasix.     Chlorthalidone increased on 2/8, again on 2/12, increased again on 2/15 9. T2DM with retinopathy and neuropathy: Blood sugars poorly controlled with A1C- 11.0. Consult RD to educated on [place]. Monitor BS ac/hs.   Novolog 70/30 30 BID   Novalog decreased to 2U TID  qHS snack added 2/14  Remains labile with variable with intake   10. CAD/ Acute systolic CHF/NICM: compensated. Check daily weights and monitor for signs of overload. Heart healthy/low salt diet. Will need perfusion study on outpatient basis per cardiology. Cont meds Filed Weights   04/14/16 0630 04/15/16 0508 04/16/16 0627  Weight: 112.8 kg (248 lb 10.9 oz) 114.3 kg (252 lb) 115.1  kg (253 lb 12 oz)   Clinically stable 11. Morbid Obesity: BMI - 36. Encourage appropriate diet, weight loss and exercise.  12. CKD with AKI:   Cr. 1.47 on 2/19  Encourage fluids  Continue IVF qhs at 50cc/hr 13. Hypokalemia:   Increased K+ supplement 2/9  K+ 3.5 on 2/19  Continue supplement 14. ABLA  Hb 12.0 on 2/16  Cont to monitor 15. Acute lower UTI  UA+, Ucx with Pseudomonas and enterococcus  Spoke with pharmacy, macrobid changed to Cipro, completed 2/12-2/18  LOS (Days) 13 A FACE TO FACE EVALUATION WAS PERFORMED  Alani Sabbagh Lorie Phenix 04/16/2016 8:46 AM

## 2016-04-16 NOTE — Progress Notes (Signed)
Occupational Therapy Session Note  Patient Details  Name: [name] MRN: [id number] Date of Birth: 05-Jul-1955  Today's Date: 04/16/2016  Session 1 OT Individual Time: 0902-1000 OT Individual Time Calculation (min): 58 min   Session 2 OT Individual Time: 6734-1937 OT Individual Time Calculation (min): 26 min    Short Term Goals: Week 2:  OT Short Term Goal 1 (Week 2): Pt will complete LB dressing with max assist of one caregiver OT Short Term Goal 2 (Week 2): Pt will complete sit > stand with mod assist to decrease burden of care with LB dressing/hygiene OT Short Term Goal 3 (Week 2): Pt will complete functional transfer with LRAD to [facility] with Mod A of 1 to decrease caregiver burden  Skilled Therapeutic Interventions/Progress Updates: Session 1    1:1 OT session focused on modified bathing/dressing, R side attention, functional use of R Ue, and improved sit<>stand. Pt w/ increased lethargy today required max multimodal cues to maintain alertness. Stedy used to transfer pt to wc with Max A + lateral lean to R. Max multimodal cues to integrate R UE in bathing/dressing tasks.  Incorporated B UE coordination when opening containers and hand-over-hand to apply soap to wash cloth. Mod A for UB dressing with increased time and max multimodal cues to thread R UE through sleeve. Total A for LB dressing despite increased time and max multimodal cues for hemi-techniques + max A sit<>stand to pull pants over hips. Facilitated Lateral weight shift to L while in stedy using mirror feedback and tactile/verbal cues. Pt responds well to mirror feedback.  Built up cushion under R hip to promote midline pelvic position. Max multimodal cues + hand-over-hand assist to reach and grasp hair brush w/ RI hand and brush hair. Pt left seated in TIS  wc at end of session with needs met and daughter present.   Session 2 Nursing finishing straight cath upon OT arrival. Pt continued to be limited by low arousal,  affecting his ability to participate fully in OT- however, slightly improved from this morning. Addressed   LB dressing and standing balance using Stedy for sit<>stand to pull pants over hips. Incorporated mirror feedback to promote midline posture with verbal and tactile cues to correct lateral lean to R in standing. R UE NMR and fine-motor coordination with focus on lateral pinch using graded clothes pin activity. Hand-over-hand assist required to facilitate finger/thumb coordination to achieve pinch. Difficulty grading strength as well, often over exaggerating pinch. Pt returned to bed at end of session per nursing request.   Therapy Documentation Precautions:  Precautions Precautions: Fall Restrictions Weight Bearing Restrictions: No Pain:  Denies pain ADL: ADL ADL Comments: Please see functional navigator   See Function Navigator for Current Functional Status.   Therapy/Group: Individual Therapy  Valma Cava 04/16/2016, 1:32 PM

## 2016-04-16 NOTE — Progress Notes (Signed)
Social Work Patient ID: [name], male   DOB: Dec 03, 1955, 61 y.o.   MRN: [id number] Met with daughter who was here to attend therapies with pt today. He would make eye contact hear what you had to say then look away. Continues to have poor intake and not interested in eating. He is now not participating in therapies and seems to be quite depressed, along with in pain from his sacrum. Air bed in room and trying to lay on side. He didn't seem To do anymore with daughter here encouraging him in therapies and with his lunch. Will continue to work on plans and neuro-psych to see tomorrow.

## 2016-04-16 NOTE — Progress Notes (Signed)
Speech Language Pathology Daily Session Note  Patient Details  Name: [name] MRN: [id number] Date of Birth: 1955/09/23  Today's Date: 04/16/2016 SLP Individual Time: 1400-1445 SLP Individual Time Calculation (min): 45 min  Short Term Goals: Week 2: SLP Short Term Goal 1 (Week 2): Pt will demonstrate sustained attention to basic, familiar tasks for 5 minutes with Max A cues for redirection.  SLP Short Term Goal 2 (Week 2): Given choice of orientation information, pt will select correct answer with Max A multimodal cues.  SLP Short Term Goal 3 (Week 2): Pt will utilize speech intelligibility strategies with Max A verbal cues to achieve ~>75% intelligibility at the simple conversation level.  SLP Short Term Goal 4 (Week 2): Given Max A multimodal cues, pt will complete functional 1 step basic, familiar directions.  SLP Short Term Goal 5 (Week 2): Pt will consume least restrictive diet without overt s/s of aspiration with Min A cues for use of compensatory swallow strategies.   Skilled Therapeutic Interventions: Skilled treatment session focused on dysphagia and cognition goals. SLP facilitated session by giving pt 2 choices of activity. With Max A support, pt able to respond verbally. Pt selected to get out of bed, SLP facilitated session by providing Max A 1 step directions for placement of feet, hands etc. Pt able to complete 1 step functional directions when Max A cues given to open his eyes. Pt only opened eyes when cued and able to hold eyes open until response to question/activity is expected.  Question continued behavioral component. Pt's speech is intelligible when he responds. SLP facilitated session by providing skilled observation of thin liquids via straw. Pt with immediate cough. Pt without overt s/s of aspiration when consuming via cup. Pt was transferred back to bed and left with all needs within reach. Continue current plan of care.      Function:  Eating Eating   Modified  Consistency Diet: No Eating Assist Level: Set up assist for;Helper scoops food on utensil;Helper brings food to mouth;Hand over hand assist;Help managing cup/glass;Help with picking up utensils;More than reasonable amount of time   Eating Set Up Assist For: Opening containers Helper Scoops Food on Utensil: Every scoop Helper Brings Food to Mouth: Every scoop   Cognition Comprehension Comprehension assist level: Understands basic 25 - 49% of the time/ requires cueing 50 - 75% of the time  Expression   Expression assist level: Expresses basic 25 - 49% of the time/requires cueing 50 - 75% of the time. Uses single words/gestures.  Social Interaction Social Interaction assist level: Interacts appropriately less than 25% of the time. May be withdrawn or combative.  Problem Solving Problem solving assist level: Solves basic less than 25% of the time - needs direction nearly all the time or does not effectively solve problems and may need a restraint for safety  Memory Memory assist level: Recognizes or recalls less than 25% of the time/requires cueing greater than 75% of the time    Pain    Therapy/Group: Individual Therapy  Smith Potenza B. Rutherford Nail, M.S., CCC-SLP Speech-Language Pathologist  Zakai Gonyea 04/16/2016, 4:07 PM

## 2016-04-16 NOTE — Progress Notes (Signed)
Physical Therapy Session Note  Patient Details  Name: [name] MRN: [id number] Date of Birth: April 10, 1955  Today's Date: 04/16/2016 PT Individual Time: 1111-1205 PT Individual Time Calculation (min): 54 min   Short Term Goals: Week 2:  PT Short Term Goal 1 (Week 2): Pt will perform bed mobility with mod A using hospital bed functions. PT Short Term Goal 2 (Week 2): Patient will transfer sit <> stand with mod A 50% of available opportunities.  PT Short Term Goal 3 (Week 2): Pt will transfer bed <> wheelchair with mod A 25% of available opportunities.  Week 3:     Skilled Therapeutic Interventions/Progress Updates:  Pt up in [facility] with daughter, Theadora Rama, present. Pt lethargic throughout, needing cues for opening eyes and attention to task. Tx focused on cognitive remediation, standing frame for Therapeutic activity, and NMR via midline orientation, postural control, RUE motor control. Staff pushed pt to gym in reclined WC.   Sit<>stand in standing frame x2. First trial x6 min, second troal x10 minutes. Pt able to assist with lift and lower 25%. PT performed manual facilitation for postural control and weight shifting. Pt needed max cues for arousal, attention to task, and use of R UE during peg placing task, including attention to color. Pt was able to complete task with RUE 2/6 trials, and with LUE 6/6 trials. Attempted various simple marker tasks on mirror, but pt unable to engage. BP monitored throughout, and it dropped slightly from sit>stand, but <57mmhg. Pt able to lift LEs partially for WC positioning and in frame.   Pt left up in [place]. Discussed option for TED hose rather than ACE wraps with PA, but she felt that they would not impact his progress at this time. Family/RN assisting with meal.      Therapy Documentation Precautions:  Precautions Precautions: Fall Restrictions Weight Bearing Restrictions: No General:   Vital Signs: Therapy Vitals Pulse Rate: 64 BP: 140/69 Pain:  none     See Function Navigator for Current Functional Status.   Therapy/Group: Individual Therapy Kennieth Rad, PT, DPT  Kennieth Rad M 04/16/2016, 11:42 AM

## 2016-04-16 NOTE — Consult Note (Signed)
Psychological/psychological consultation   Patient:   [name]    DOB:   [birth year]  MR Number:  [id number]  Location:  [facility] [id number] [place] [address] Dept: [place]: [id number]           Date of Service:   /19/2018  Start Time:   10 AM End Time:   10:30 AM  Provider/Observer:  Edgardo Roys PSYD       Billing Code/Service: [phone]  Chief Complaint:    No chief complaint on file.   Reason for Service:  The patient was referred for psychological/neuropsychological consultation due to adjustment difficulties following a cerebrovascular accident. She has had a brainstem stroke in the area of the pons with loss of motor function in the right side of his body. The patient has also has had fluctuations in arousal levels. The patient has been at times agitated when being asked to do physical therapy or other things he needs as far as his recovery from this significant.  Current Status:  The patient has been very frustrated with his loss of independence. I spoke with his daughter today as well and she reports that he had been a very independent and hard-working individual prior to this event and that he is extremely upset and this is depressing to him. He is aware that he is going to need to be going to a skilled nursing facility after his discharge from here and he is not coping well with this reality.  Reliability of Information: May patient comes from review of his medical records, discussions with treatment staff, and discussions with the patient and his daughter.  Behavioral Observation: [name]  presents as a 61 y.o.-year-old Right African American Male who appeared his stated age. his dress was Appropriate and he was Well Groomed and his manners were Appropriate to the situation.  his participation was indicative of Drowsy behaviors.  There were  physical  disabilities noted.  he displayed an appropriate level of cooperation and motivation.     Interactions:    Minimal Drowsy  Attention:   abnormal and attention span appeared shorter than expected for age  Memory:   not examined;    Visuo-spatial:  not examined  Speech (Volume):  low  Speech:   slurred; THe patient had times where he would orient and talk with me and other times where he had difficulty maintaining arousal.  Thought Process:  Coherent  Though Content:  WNL; not suicidal  Orientation:   person and place  Judgment:   Poor  Planning:   Poor  Affect:    Angry and Depressed  Mood:    Angry and Depressed  Insight:   Fair  Intelligence:   normal  Medical History:   Past Medical History:  Diagnosis Date  . Adrenal insufficiency ([place])   . Allergy   . Arthritis   . Blind left eye   . Carpal tunnel syndrome   . Cataract    bil removed  . CHF (congestive heart failure) ([place])   . Diabetes mellitus without complication ([place])   . ED (erectile dysfunction)   . Gout   . Herpes   . Hypercholesterolemia   . Hypertension   . MI (myocardial infarction)   . Peripheral neuropathy ([place])   . Pneumonia   . PVD (peripheral vascular disease) ([place])         Facility-Administered Encounter Medications as  of 04/16/2016  Medication  . 0.9 %  sodium chloride infusion  . acetaminophen (TYLENOL) tablet 325-650 mg  . alum & mag hydroxide-simeth (MAALOX/MYLANTA) 200-200-20 MG/5ML suspension 30 mL  . amantadine (SYMMETREL) capsule 100 mg  . atorvastatin (LIPITOR) tablet 40 mg  . bisacodyl (DULCOLAX) suppository 10 mg  . carvedilol (COREG) tablet 25 mg  . chlorthalidone (HYGROTON) tablet 75 mg  . cloNIDine (CATAPRES) tablet 0.3 mg  . diphenhydrAMINE (BENADRYL) 12.5 MG/5ML elixir 12.5-25 mg  . feeding supplement (GLUCERNA SHAKE) (GLUCERNA SHAKE) liquid 237 mL  . feeding supplement (PRO-STAT SUGAR FREE 64) liquid 30 mL  . FLUoxetine (PROZAC) capsule 10 mg  . furosemide  (LASIX) tablet 80 mg  . guaiFENesin-dextromethorphan (ROBITUSSIN DM) 100-10 MG/5ML syrup 5-10 mL  . hydrocerin (EUCERIN) cream  . hydrocortisone (ANUSOL-HC) 2.5 % rectal cream  . Influenza vac split quadrivalent PF (FLUARIX) injection 0.5 mL  . insulin aspart (novoLOG) injection 0-9 Units  . insulin aspart (novoLOG) injection 2 Units  . insulin aspart protamine- aspart (NOVOLOG MIX 70/30) injection 30 Units  . insulin aspart protamine- aspart (NOVOLOG MIX 70/30) injection 30 Units  . isosorbide-hydrALAZINE (BIDIL) 20-37.5 MG per tablet 2 tablet  . lidocaine (LIDODERM) 5 % 1 patch  . lidocaine (XYLOCAINE) 2 % jelly  . losartan (COZAAR) tablet 100 mg  . mirtazapine (REMERON) tablet 7.5 mg  . nystatin (MYCOSTATIN) 100000 UNIT/ML suspension 500,000 Units  . pneumococcal 23 valent vaccine (PNU-IMMUNE) injection 0.5 mL  . polyethylene glycol (MIRALAX / GLYCOLAX) packet 17 g  . potassium chloride SA (K-DUR,KLOR-CON) CR tablet 40 mEq  . prochlorperazine (COMPAZINE) tablet 5-10 mg   Or  . prochlorperazine (COMPAZINE) injection 5-10 mg   Or  . prochlorperazine (COMPAZINE) suppository 12.5 mg  . rivaroxaban (XARELTO) tablet 20 mg  . senna-docusate (Senokot-S) tablet 1 tablet  . sodium chloride 0.9 % 1,000 mL infusion  . sodium phosphate (FLEET) 7-19 GM/118ML enema 1 enema  . traZODone (DESYREL) tablet 25-50 mg   Outpatient Encounter Prescriptions as of 04/16/2016  Medication Sig  . atorvastatin (LIPITOR) 40 MG tablet Take 1 tablet (40 mg total) by mouth daily at 6 PM.  . carvedilol (COREG) 25 MG tablet Take 1 tablet (25 mg total) by mouth 2 (two) times daily with a meal.  . chlorthalidone (HYGROTON) 25 MG tablet Take 1 tablet (25 mg total) by mouth daily.  . cloNIDine (CATAPRES) 0.3 MG tablet Take 1 tablet (0.3 mg total) by mouth 3 (three) times daily.  . furosemide (LASIX) 80 MG tablet Take 80 mg by mouth daily.   . insulin aspart protamine- aspart (NOVOLOG MIX 70/30) (70-30) 100 UNIT/ML  injection Inject 0.6 mLs (60 Units total) into the skin daily with breakfast.  . insulin aspart protamine- aspart (NOVOLOG MIX 70/30) (70-30) 100 UNIT/ML injection Inject 0.35 mLs (35 Units total) into the skin daily with supper.  . isosorbide-hydrALAZINE (BIDIL) 20-37.5 MG tablet Take 2 tablets by mouth 3 (three) times daily.  Marland Kitchen losartan (COZAAR) 100 MG tablet Take 1 tablet (100 mg total) by mouth daily.  . rivaroxaban (XARELTO) 20 MG TABS tablet Take 20 mg by mouth daily.  Marland Kitchen senna-docusate (SENOKOT-S) 8.6-50 MG tablet Take 1 tablet by mouth daily.  Marland Kitchen triamcinolone ointment (KENALOG) 0.1 % Apply 1 application topically 2 (two) times daily as needed (rash/irritation on legs).           Sexual History:   History  Sexual Activity  . Sexual activity: No    Family Med/Psych History:  Family  History  Problem Relation Age of Onset  . Breast cancer Mother   . Hypertension Father     blood clot in leg  . Diabetes Maternal Aunt   . Heart attack Sister   . Colon cancer Neg Hx   . Esophageal cancer Neg Hx   . Rectal cancer Neg Hx   . Stomach cancer Neg Hx     Risk of Suicide/Violence: low the patient denies any suicidal ideation at this time but does acknowledge a great deal of frustration about what has happened to him. His daughter acknowledges that the patient has been very depressed and does appear that there've been times when he has gotten very angry and verbalized some intent to assault staff members but this is likely not a direct threat but more of a shortened expression of his level of frustration.  Impression/DX:  Patient is described as always been very independent individual that has now lost almost all function of the right side of his body. The patient is also having significant issues with arousal levels and his daughter reports that some days he is alert and oriented and other days he is unable to maintain arousal.  Disposition/Plan:  Goes the patient was having so much  trouble today maintaining his arousal level we did have our discussion but agreed to talk tomorrow afternoon and hope it is a better day for him because he had a lot of things he wanted to talk about with regard to his frustration.  Diagnosis:    Brainstem stroke (HCC)  Adjustment disorder with mixed anxiety and depressed mood  Cerebrovascular accident (CVA) due to embolism of left posterior cerebral artery ([place])  Lethargy         Electronically Signed   _______________________ Ilean Skill, Psy.D.

## 2016-04-17 ENCOUNTER — Encounter (HOSPITAL_COMMUNITY): Payer: Medicare Other | Admitting: Psychology

## 2016-04-17 ENCOUNTER — Encounter (HOSPITAL_COMMUNITY): Payer: Self-pay | Admitting: Psychology

## 2016-04-17 ENCOUNTER — Inpatient Hospital Stay (HOSPITAL_COMMUNITY): Payer: Medicare Other | Admitting: Physical Therapy

## 2016-04-17 ENCOUNTER — Inpatient Hospital Stay (HOSPITAL_COMMUNITY): Payer: Medicare Other | Admitting: Occupational Therapy

## 2016-04-17 ENCOUNTER — Inpatient Hospital Stay (HOSPITAL_COMMUNITY): Payer: Medicare Other | Admitting: Speech Pathology

## 2016-04-17 DIAGNOSIS — R4189 Other symptoms and signs involving cognitive functions and awareness: Secondary | ICD-10-CM

## 2016-04-17 DIAGNOSIS — R4689 Other symptoms and signs involving appearance and behavior: Secondary | ICD-10-CM

## 2016-04-17 LAB — GLUCOSE, CAPILLARY
GLUCOSE-CAPILLARY: 164 mg/dL — AB (ref 65–99)
Glucose-Capillary: 234 mg/dL — ABNORMAL HIGH (ref 65–99)
Glucose-Capillary: 350 mg/dL — ABNORMAL HIGH (ref 65–99)
Glucose-Capillary: 260 mg/dL — ABNORMAL HIGH (ref 65–99)

## 2016-04-17 NOTE — Progress Notes (Signed)
Speech Language Pathology Daily Session Note  Patient Details  Name: [name] MRN: [id number] Date of Birth: January 11, 1956  Today's Date: 04/17/2016 SLP Individual Time: 1030-1115 SLP Individual Time Calculation (min): 45 min  Short Term Goals: Week 2: SLP Short Term Goal 1 (Week 2): Pt will demonstrate sustained attention to basic, familiar tasks for 5 minutes with Max A cues for redirection.  SLP Short Term Goal 2 (Week 2): Given choice of orientation information, pt will select correct answer with Max A multimodal cues.  SLP Short Term Goal 3 (Week 2): Pt will utilize speech intelligibility strategies with Max A verbal cues to achieve ~>75% intelligibility at the simple conversation level.  SLP Short Term Goal 4 (Week 2): Given Max A multimodal cues, pt will complete functional 1 step basic, familiar directions.  SLP Short Term Goal 5 (Week 2): Pt will consume least restrictive diet without overt s/s of aspiration with Min A cues for use of compensatory swallow strategies.   Skilled Therapeutic Interventions: Skilled treatment session focused on dysphagia and cognition goals. Daughter present for session and pt with increased participation from total encouragement to participate to max encouragement to participate. With Max A cues from daughter and SLP, pt able to focus attention for ~1 minute intervals. Pt able to name common objects with max verbal cues for speech intelligibility at the word level for increasing volume. Pt consumed thin liquids via cup without overt aspiration with Max A verbal cues to visually attend to cup or graham cracker. Will attempt to schedule ST when daughter is available for increased participation. Pt with education to daughter on decreased functional ability in ST sessions. Pt was returned to room, left upright in tilt-in-space wheelchair with daughter present and all needs within reach. Continue per current plan of care.      Function:  Eating Eating    Modified Consistency Diet: No Eating Assist Level: Set up assist for;Helper scoops food on utensil;Helper brings food to mouth;Hand over hand assist;Help managing cup/glass;Help with picking up utensils;More than reasonable amount of time   Eating Set Up Assist For: Opening containers Helper Scoops Food on Utensil: Occasionally Helper Brings Food to Mouth: Occasionally   Cognition Comprehension Comprehension assist level: Understands basic 50 - 74% of the time/ requires cueing 25 - 49% of the time  Expression   Expression assist level: Expresses basic 25 - 49% of the time/requires cueing 50 - 75% of the time. Uses single words/gestures.  Social Interaction Social Interaction assist level: Interacts appropriately 25 - 49% of time - Needs frequent redirection.  Problem Solving Problem solving assist level: Solves basic 25 - 49% of the time - needs direction more than half the time to initiate, plan or complete simple activities  Memory Memory assist level: Recognizes or recalls 25 - 49% of the time/requires cueing 50 - 75% of the time    Pain    Therapy/Group: Individual Therapy  Ezri Fanguy B. Rutherford Nail, M.S., CCC-SLP Speech-Language Pathologist  Tajee Savant 04/17/2016, 12:06 PM

## 2016-04-17 NOTE — Progress Notes (Signed)
Physical Therapy Session Note  Patient Details  Name: [name] MRN: [id number] Date of Birth: 1955-07-06  Today's Date: 04/17/2016 PT Individual Time: 1300-1400 PT Individual Time Calculation (min): 60 min   Short Term Goals: Week 2:  PT Short Term Goal 1 (Week 2): Pt will perform bed mobility with mod A using hospital bed functions. PT Short Term Goal 2 (Week 2): Patient will transfer sit <> stand with mod A 50% of available opportunities.  PT Short Term Goal 3 (Week 2): Pt will transfer bed <> wheelchair with mod A 25% of available opportunities.   Skilled Therapeutic Interventions/Progress Updates:   Patient in bed upon arrival. Patient keeping eyes closed throughout session and required max multimodal cues for arousal and participation and attention to task. Utilized patient-selected genre of music to increase arousal/alertness during functional tasks with no change noted. With increased time, patient did answer all questions with single answers and when given field of two choices to work on in therapy, he verbalized a choice 100% of time, greatly improved from previous sessions (either answered, "whatever" or no response). Performed standing frame x 10 min with focus on arousal, postural control, and midline reorientation but patient maintaining forward flexed posture on table and consistently falling asleep despite max cues and manual facilitation from PT. Sit <> stand x 4 with mod A lifting/lowering assistance with RUE around therapist. Gait training using L rail in hallway x 10 + 20 ft + 10 ft with max A overall to advance and safely place RLE and maintain upright posture due to heavy pushing to R,  2 episodes of anterior LOB requiring total A to recover, and +2 wheelchair follow for safety. Patient performed squat pivot to R and L from bed <> TIS wheelchair with max-total A and bed mobility with use of rail with max A for supine > sit and total A for sit > supine, total A to reposition in  bed. Patient left semi reclined in bed with 4 rails up and needs in reach, nursing notified to assist patient to wheelchair to increase participation with later neuropsychologist appointment.   Therapy Documentation Precautions:  Precautions Precautions: Fall Restrictions Weight Bearing Restrictions: No Vital Signs: Therapy Vitals Temp: 98.2 F (36.8 C) Temp Source: Oral Pulse Rate: 64 Resp: 18 BP: (!) 150/55 Patient Position (if appropriate): Sitting Oxygen Therapy SpO2: 96 % O2 Device: Not Delivered Pain: Pain Assessment Pain Assessment: Faces Faces Pain Scale: No hurt   See Function Navigator for Current Functional Status.   Therapy/Group: Individual Therapy  Einar Nolasco, Murray Hodgkins 04/17/2016, 2:24 PM

## 2016-04-17 NOTE — Consult Note (Signed)
Neuropsychological/Psychological Consultation  Patient:  [name]   DOB: 05-17-1955  MR Number: [id number]  Location: [facility] [address] [place] [id number] [place] [address] Dept: [id number]: [phone]  Start: 3:30 PM End: 4:01 PM  Provider/Observer:        Chief Complaint:     No chief complaint on file.   Reason For Service:     The patient was referred for psychological/neuropsychological consultation due to adjustment difficulties following a cerebrovascular accident. She has had a brainstem stroke in the area of the pons with loss of motor function in the right side of his body. The patient has also has had fluctuations in arousal levels. The patient has been at times agitated when being asked to do physical therapy or other things he needs as far as his recovery from this significant.  Interventions Strategy:  Psychological consultation for psychological issues negatively impacting Medical Issues  Participation Level:   Minimal  Participation Quality:  Drowsy      Behavioral Observation:  Well Groomed, Drowsy and Lethargic, and Constricted.   Current Psychosocial Factors: The patient was unable to sustain conversation with me today.  We would talk or rather I would ask questions and he would provide very brief response with closed eyes most of the time.  This is likely due to brainstem stroke.  Content of Session:   Reviewed issues related to his treatment and the things he will need to do as part of his care.  Current Status:   The patient is reported to have times with improved interactions with others.  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  Other providers reported that he had more interactions earlier in the day and that he is likely fatigued from that.  Patient Progress:   Still having significant issues with arousal and  engagement with others.  Will try to interact with him earlier in day tomorrow.  Target Goals:   Work on increasing patient's engagement with others.  Last Reviewed:   04/17/16  Impression/Diagnosis:   Patient is described as always been very independent individual that has now lost almost all function of the right side of his body. The patient is also having significant issues with arousal levels and his daughter reports that some days he is alert and oriented and other days he is unable to maintain arousal.  Diagnosis:   Cerebrovascular accident (CVA) due to embolism of left posterior cerebral artery ([place]) - Plan: Ambulatory referral to Physical Medicine Rehab

## 2016-04-17 NOTE — Plan of Care (Signed)
Problem: RH BOWEL ELIMINATION Goal: RH STG MANAGE BOWEL WITH ASSISTANCE STG Manage Bowel with mod Assistance.    Outcome: Not Progressing last bm 04/14/16 Goal: RH STG MANAGE BOWEL W/MEDICATION W/ASSISTANCE STG Manage Bowel with Medication with min Assistance.    Outcome: Not Progressing Last bm 04/14/16   Problem: RH BLADDER ELIMINATION Goal: RH STG MANAGE BLADDER WITH ASSISTANCE STG Manage Bladder With mod Assistance   Outcome: Not Progressing In/out cath every six hours

## 2016-04-17 NOTE — Progress Notes (Signed)
[facility] PH[facility]ICAL MEDICINE & REHABILITATION     PROGRESS NOTE  Subjective/Complaints:  Pt seen sitting up in bed this AM, eating breakfast.  He slept well overnight.    ROS: Denies CP, SOB, N/V/D.Marland Kitchen  Objective: Vital Signs: Blood pressure (!) 155/74, pulse 77, temperature 98.1 F (36.7 C), temperature source Oral, resp. rate 18, height 5\' 9"  (1.753 m), weight 115.1 kg (253 lb 12 oz), SpO2 98 %. No results found. No results for input(s): WBC, HGB, HCT, PLT in the last 72 hours.  Recent Labs  04/16/16 0255  NA 144  K 3.5  CL 108  GLUCOSE 102*  BUN 37*  CREATININE 1.47*  CALCIUM 9.2   CBG (last 3)   Recent Labs  04/16/16 1644 04/16/16 2107 04/17/16 0626  GLUCAP 196* 174* 234*    Wt Readings from Last 3 Encounters:  04/16/16 115.1 kg (253 lb 12 oz)  03/28/16 111.1 kg (245 lb)  09/04/13 120.2 kg (265 lb)    Physical Exam:  BP (!) 155/74 (BP Location: Right Arm)   Pulse 77   Temp 98.1 F (36.7 C) (Oral)   Resp 18   Ht 5\' 9"  (1.753 m)   Wt 115.1 kg (253 lb 12 oz)   SpO2 98%   BMI 37.47 kg/m  Constitutional: He appears well-developed. Obese. HENT: Normocephalic and atraumatic.  Eyes: EOM are normal. No discharge.  Cardiovascular: RRR. No JVD. Respiratory: Effort normal and breath sounds normal.  GI: Soft. Bowel sounds are normal.  Musculoskeletal: He exhibits no edema. He exhibits no tenderness.  Neurological: Keeps eyes closed. Dysarthric  Limited vision.  Poor insight and awareness.    Motor: 4+/5 RUE prox to distal. Ataxia (stable).  RLE: 4/+/5 proximal to distal  LLE 4+ /5 proximal to distal.  LUE/LLE proximal to distal 5/5.  Skin: Warm and dry. B/l DTIs.  Psychiatric: Flat affect. Limited initiation.   Assessment/Plan: 1. Functional deficits secondary to left paramedian pontine infarct which require 3+ hours per day of interdisciplinary therapy in a comprehensive inpatient rehab setting. Physiatrist is providing close team supervision and 24 hour  management of active medical problems listed below. Physiatrist and rehab team continue to assess barriers to discharge/monitor patient progress toward functional and medical goals.  Function:  Bathing Bathing position   Position: Wheelchair/chair at sink  Bathing parts Body parts bathed by patient: Right arm, Left arm, Chest, Abdomen, Left upper leg, Right upper leg Body parts bathed by helper: Left lower leg, Right lower leg, Buttocks, Front perineal area  Bathing assist Assist Level: Touching or steadying assistance(Pt > 75%)      Upper Body Dressing/Undressing Upper body dressing   What is the patient wearing?: Pull over shirt/dress     Pull over shirt/dress - Perfomed by patient: Pull shirt over trunk, Put head through opening Pull over shirt/dress - Perfomed by helper: Thread/unthread right sleeve, Thread/unthread left sleeve Button up shirt - Perfomed by patient: Thread/unthread left sleeve, Thread/unthread right sleeve Button up shirt - Perfomed by helper: Pull shirt around back, Button/unbutton shirt    Upper body assist Assist Level: Touching or steadying assistance(Pt > 75%)      Lower Body Dressing/Undressing Lower body dressing   What is the patient wearing?: Pants, Non-skid slipper socks       Pants- Performed by helper: Pull pants up/down, Thread/unthread left pants leg, Thread/unthread right pants leg   Non-skid slipper socks- Performed by helper: Don/doff right sock, Don/doff left sock       Shoes -  Performed by helper: Don/doff right shoe, Don/doff left shoe, Fasten right, Fasten left   AFO - Performed by helper: Don/doff right AFO, Don/doff left AFO (ACE wraps)   TED Hose - Performed by helper: Don/doff right TED hose, Don/doff left TED hose  Lower body assist Assist for lower body dressing: Touching or steadying assistance (Pt > 75%)      Toileting Toileting Toileting activity did not occur: No continent bowel/bladder event   Toileting steps  completed by helper: Adjust clothing prior to toileting, Performs perineal hygiene, Adjust clothing after toileting Toileting Assistive Devices: Other (comment) (urinal)  Toileting assist Assist level: Touching or steadying assistance (Pt.75%)   Transfers Chair/bed transfer   Chair/bed transfer method: Squat pivot Chair/bed transfer assist level: 2 helpers Chair/bed transfer assistive device: Mechanical lift Mechanical lift: Ecologist     Max distance: 15 ft Assist level: 2 helpers   Wheelchair   Type: Manual Max wheelchair distance: 50 ft Assist Level: Dependent (Pt equals 0%)  Cognition Comprehension Comprehension assist level: Understands basic 25 - 49% of the time/ requires cueing 50 - 75% of the time  Expression Expression assist level: Expresses basic 25 - 49% of the time/requires cueing 50 - 75% of the time. Uses single words/gestures.  Social Interaction Social Interaction assist level: Interacts appropriately 25 - 49% of time - Needs frequent redirection.  Problem Solving Problem solving assist level: Solves basic 25 - 49% of the time - needs direction more than half the time to initiate, plan or complete simple activities  Memory Memory assist level: Recognizes or recalls 25 - 49% of the time/requires cueing 50 - 75% of the time    Medical Problem List and Plan: 1.  Right hemiparesis and functional deficits secondary to left paramedian pontine infarct on 1/31  Cont CIR  Baclofen started for spasms d/ced on 2/14 due to lethargy with some improvement  Will consider head CT due to ?increase in lethargy and slow progress, however, pt appears to be slightly improved today. 2. H/o DVT /Anticoagulation: Pharmaceutical: Xarelto 3. Pain Management:   Lidoderm patch started due to therapy reports of pain - patient states improved, however, ?reliability.  4. Mood: LCSW to follow for evaluation and support.   Fluoxetine started 2/15  Trial amantadine on 2/19,  ?improvement 5. Neuropsych: This patient is not yet capable of making decisions on his own behalf.  Neuropsych eval unable to be completed due to lethargy, will try again today. 6. Skin/Wound Care: Routine pressure relief measures. Eucerin to b/l LE.  Prevalon boots ordered for DTI 7. Fluids/Electrolytes/Nutrition: Monitor I/Os  Intake has been variable affecting CBGs  Remeron added 2/15, will consider further increase 8. HTN: Monitor BP.   Continue coreg, cozaar, Bidil and lasix.     Chlorthalidone increased on 2/8, again on 2/12, increased again on 2/15 9. T2DM with retinopathy and neuropathy: Blood sugars poorly controlled with A1C- 11.0. Consult RD to educated on [place]. Monitor BS ac/hs.   Novolog 70/30 30 BID   Novalog decreased to 2U TID  qHS snack added 2/14  Remains labile with variable with intake, will consider increase in meds if persistent   10. CAD/ Acute systolic CHF/NICM: compensated. Check daily weights and monitor for signs of overload. Heart healthy/low salt diet. Will need perfusion study on outpatient basis per cardiology. Cont meds Filed Weights   04/14/16 0630 04/15/16 0508 04/16/16 0627  Weight: 112.8 kg (248 lb 10.9 oz) 114.3 kg (252 lb) 115.1 kg (253  lb 12 oz)   Clinically stable  ?Trending up 11. Morbid Obesity: BMI - 36. Encourage appropriate diet, weight loss and exercise.  12. CKD with AKI:   Cr. 1.47 on 2/19  Encourage fluids  Continue IVF qhs at 50cc/hr, d/ced 2/20  Labs ordered for tomorrow 13. Hypokalemia:   Increased K+ supplement 2/9  K+ 3.5 on 2/19  Continue supplement  Labs ordered for tomorrow 14. ABLA  Hb 12.0 on 2/16  Cont to monitor  Labs ordered for tomorrow 15. Acute lower UTI  UA+, Ucx with Pseudomonas and enterococcus  Spoke with pharmacy, macrobid changed to Cipro, completed 2/12-2/18  LOS (Days) 14 A FACE TO FACE EVALUATION WAS PERFORMED  Ankit Lorie Phenix 04/17/2016 8:13 AM

## 2016-04-17 NOTE — Plan of Care (Signed)
Problem: RH BLADDER ELIMINATION Goal: RH STG MANAGE BLADDER WITH ASSISTANCE STG Manage Bladder With mod Assistance   Outcome: Not Progressing Patient unable to urinate on a regular basis

## 2016-04-17 NOTE — Progress Notes (Signed)
Occupational Therapy Session Note  Patient Details  Name: [name] MRN: :[id number] Date of Birth: 06/27/1955  Today's Date: 04/17/2016 OT Individual Time: [id number] Individual Time Calculation (min): 44 min    Short Term Goals: Week 2:  OT Short Term Goal 1 (Week 2): Pt will complete LB dressing with max assist of one caregiver OT Short Term Goal 2 (Week 2): Pt will complete sit > stand with mod assist to decrease burden of care with LB dressing/hygiene OT Short Term Goal 3 (Week 2): Pt will complete functional transfer with LRAD to [facility] with Mod A of 1 to decrease caregiver burden  Skilled Therapeutic Interventions/Progress Updates:    Pt worked on Brewing technologist during session.  Max assist for squat pivot transfer to the right from wheelchair to mat.  Pt with increased lean to the right in sitting but able to correct with min assist initially.  Worked on functional reaching with the RUE for grasping and stacking cups on bedside table.  Min facilitation needed at the arm and shoulder to pick up the cup and then place.   Mod assist for trunk control as pt falls into trunk flexion in sitting as well.  Noted decreased sustained attention to task overall, requiring mod instructional cueing to maintain focus on task.  At times even closing his eyes.  Progressed to functional reaching and weightshifts to the left to help decrease pushing tendencies to the right.  Pt needed placement of objects to be reached up high to promote trunk and cervical extension.  Had him reaching with the LUE during this task.  Finished session by working on sit to stand transitions and standing to reach cups and place to target on the table.  Pt with increased trunk flexion in standing as well, requiring +2 (pt 30%) for sit to stand and standing balance.  Max facilitation needed at the right knee and trunk to maintain upright standing.  Pt transferred back to the wheelchair with max assist squat pivot to the  left.  Tilted pt back in chair and applied safety belt.  Pt left at bedside with call button and phone in reach.    Therapy Documentation Precautions:  Precautions Precautions: Fall Restrictions Weight Bearing Restrictions: No  Pain: Pain Assessment Pain Assessment: No/denies pain ADL: ADL ADL Comments: Please see functional navigator   See Function Navigator for Current Functional Status.   Therapy/Group: Individual Therapy  Loneta Tamplin OTR/L 04/17/2016, 12:19 PM

## 2016-04-17 NOTE — Progress Notes (Signed)
Occupational Therapy Session Note  Patient Details  Name: [name] MRN: [place]:[id number] Date of Birth: 1956/02/26  Today's Date: 04/17/2016 OT Individual Time: 0901-1000 OT Individual Time Calculation (min): 59 min    Short Term Goals: Week 2:  OT Short Term Goal 1 (Week 2): Pt will complete LB dressing with max assist of one caregiver OT Short Term Goal 2 (Week 2): Pt will complete sit > stand with mod assist to decrease burden of care with LB dressing/hygiene OT Short Term Goal 3 (Week 2): Pt will complete functional transfer with LRAD to [facility] with Mod A of 1 to decrease caregiver burden  Skilled Therapeutic Interventions/Progress Updates:    1:1 OT session focused on attention, sitting balance/standing balance, LB dressing, functional use of R Ue, and R side coordination. Pt's daughter Velna Hatchet present throughout session. Pt responds well to daughters verbal commands to correct lateral leans to R. Pt with improved arousal today, but continued to require constant cues to keep eyes open during ADL session. Max multimodal cues to maintain dynamic sitting balance at EOB for LB dressing. Worked on threading pants using hemi-techniques. OT provided trunk support and facilitated weight shifting to allow pt to unweight LE's to step into pants w/ Max A[employer] forced use of R UE during LB dressing to pull pants up over legs. Sit<>stand using stedy with Mod A and Max A to maintain dynamic standing balance. Manual facilitation for weight shifting onto L Leg and to correct lateral lean to R. Pt transferred to [place] wc via Stedy for time management and left with safety belt on and in nurses care.   Therapy Documentation Precautions:  Precautions Precautions: Fall Restrictions Weight Bearing Restrictions: No Pain:  denies pain ADL: ADL ADL Comments: Please see functional navigator  See Function Navigator for Current Functional Status.   Therapy/Group: Individual Therapy  Valma Cava 04/17/2016, 10:02 AM

## 2016-04-18 ENCOUNTER — Inpatient Hospital Stay (HOSPITAL_COMMUNITY): Payer: Medicare Other | Admitting: Physical Therapy

## 2016-04-18 ENCOUNTER — Inpatient Hospital Stay (HOSPITAL_COMMUNITY): Payer: Medicare Other | Admitting: Occupational Therapy

## 2016-04-18 ENCOUNTER — Inpatient Hospital Stay (HOSPITAL_COMMUNITY): Payer: Medicare Other | Admitting: Speech Pathology

## 2016-04-18 LAB — BASIC METABOLIC PANEL
Anion gap: 12 (ref 5–15)
BUN: 36 mg/dL — ABNORMAL HIGH (ref 6–20)
CHLORIDE: 105 mmol/L (ref 101–111)
CO2: 28 mmol/L (ref 22–32)
CREATININE: 1.43 mg/dL — AB (ref 0.61–1.24)
Calcium: 9.8 mg/dL (ref 8.9–10.3)
GFR calc non Af Amer: 52 mL/min — ABNORMAL LOW (ref >60)
Glucose, Bld: 196 mg/dL — ABNORMAL HIGH (ref 65–99)
Potassium: 3.8 mmol/L (ref 3.5–5.1)
Sodium: 145 mmol/L (ref 135–145)

## 2016-04-18 LAB — CBC WITH DIFFERENTIAL/PLATELET
Basophils Absolute: 0 10*3/uL (ref 0.0–0.1)
Basophils Relative: 1 %
Eosinophils Absolute: 0.3 10*3/uL (ref 0.0–0.7)
Eosinophils Relative: 4 %
HEMATOCRIT: 40 % (ref 39.0–52.0)
HEMOGLOBIN: 12.6 g/dL — AB (ref 13.0–17.0)
LYMPHS ABS: 2.4 10*3/uL (ref 0.7–4.0)
LYMPHS PCT: 29 %
MCH: 28.6 pg (ref 26.0–34.0)
MCHC: 31.5 g/dL (ref 30.0–36.0)
MCV: 90.9 fL (ref 78.0–100.0)
MONOS PCT: 9 %
Monocytes Absolute: 0.8 10*3/uL (ref 0.1–1.0)
NEUTROS ABS: 4.9 10*3/uL (ref 1.7–7.7)
NEUTROS PCT: 57 %
Platelets: 344 10*3/uL (ref 150–400)
RBC: 4.4 MIL/uL (ref 4.22–5.81)
RDW: 12.6 % (ref 11.5–15.5)
WBC: 8.4 10*3/uL (ref 4.0–10.5)

## 2016-04-18 LAB — GLUCOSE, CAPILLARY
GLUCOSE-CAPILLARY: 349 mg/dL — AB (ref 65–99)
Glucose-Capillary: 178 mg/dL — ABNORMAL HIGH (ref 65–99)
Glucose-Capillary: 259 mg/dL — ABNORMAL HIGH (ref 65–99)
Glucose-Capillary: 212 mg/dL — ABNORMAL HIGH (ref 65–99)

## 2016-04-18 MED ORDER — HYDROCORTISONE 2.5 % RE CREA
TOPICAL_CREAM | Freq: Two times a day (BID) | RECTAL | Status: DC | PRN
  Filled 2016-04-18: qty 28.35

## 2016-04-18 MED ORDER — METHYLPHENIDATE HCL 5 MG PO TABS
5.0000 mg | ORAL_TABLET | Freq: Two times a day (BID) | ORAL | Status: DC
  Administered 2016-04-19 – 2016-04-24 (×11): 5 mg via ORAL
  Filled 2016-04-18 (×11): qty 1

## 2016-04-18 MED ORDER — INSULIN ASPART 100 UNIT/ML ~~LOC~~ SOLN
5.0000 [IU] | Freq: Three times a day (TID) | SUBCUTANEOUS | Status: DC
  Administered 2016-04-18 – 2016-04-22 (×13): 5 [IU] via SUBCUTANEOUS

## 2016-04-18 MED ORDER — AMANTADINE HCL 100 MG PO CAPS
100.0000 mg | ORAL_CAPSULE | Freq: Two times a day (BID) | ORAL | Status: DC
  Administered 2016-04-18 – 2016-04-19 (×3): 100 mg via ORAL
  Filled 2016-04-18 (×5): qty 1

## 2016-04-18 NOTE — Plan of Care (Signed)
Problem: RH BOWEL ELIMINATION Goal: RH STG MANAGE BOWEL W/MEDICATION W/ASSISTANCE STG Manage Bowel with Medication with min Assistance.    Outcome: Not Progressing LBM 2/17 PRN given but wasn't effective  Problem: RH BLADDER ELIMINATION Goal: RH STG MANAGE BLADDER WITH ASSISTANCE STG Manage Bladder With mod Assistance   Outcome: Not Progressing Patient still unable to urinate on his own effectively

## 2016-04-18 NOTE — Progress Notes (Signed)
Speech Language Pathology Weekly Progress and Session Note  Patient Details  Name: [name] MRN: [id number] Date of Birth: 1955-07-14  Beginning of progress report period: April 11, 2016 End of progress report period: April 18, 2016  Today's Date: 04/18/2016 SLP Individual Time: 1100-1200 SLP Individual Time Calculation (min): 60 min  Short Term Goals: Week 2: SLP Short Term Goal 1 (Week 2): Pt will demonstrate sustained attention to basic, familiar tasks for 5 minutes with Max A cues for redirection.  SLP Short Term Goal 1 - Progress (Week 2): Not met SLP Short Term Goal 2 (Week 2): Given choice of orientation information, pt will select correct answer with Max A multimodal cues.  SLP Short Term Goal 2 - Progress (Week 2): Not met SLP Short Term Goal 3 (Week 2): Pt will utilize speech intelligibility strategies with Max A verbal cues to achieve ~>75% intelligibility at the simple conversation level.  SLP Short Term Goal 3 - Progress (Week 2): Not met SLP Short Term Goal 4 (Week 2): Given Max A multimodal cues, pt will complete functional 1 step basic, familiar directions.  SLP Short Term Goal 4 - Progress (Week 2): Met SLP Short Term Goal 5 (Week 2): Pt will consume least restrictive diet without overt s/s of aspiration with Min A cues for use of compensatory swallow strategies.  SLP Short Term Goal 5 - Progress (Week 2): Not met    New Short Term Goals: Week 3: SLP Short Term Goal 1 (Week 3): Pt will demonstrate sustained attention to basic, familiar tasks for 5 minutes with Max A cues for redirection.  SLP Short Term Goal 2 (Week 3): Given choice of orientation information, pt will select correct answer with Max A multimodal cues.  SLP Short Term Goal 3 (Week 3): Pt will utilize speech intelligibility strategies with Max A verbal cues to achieve ~>75% intelligibility at the phrase level.  SLP Short Term Goal 4 (Week 3): Given Mod A multimodal cues, pt will complete  functional 1 step basic, familiar directions.  SLP Short Term Goal 5 (Week 3): Pt will consume least restrictive diet without overt s/s of aspiration with Mod A cues for use of compensatory swallow strategies.   Weekly Progress Updates: Pt with some progress this reporting period as evidenced by meeting 1 of 5 STG's. Pt with progress in following 1 step functional basic familiar directions with Max A multimodal cues. Pt continues to exhibit significant deficits in sustained attention, task initiation, recall of new information, and intelligibility which affect all higher level problem solving abilities. Pt continues to require skilled ST services to address these deficits, increase functional independence and reduce caregiver burden prior to discharge. Anticipate that pt will require 24 hour supervision at time of discharge and follow-up ST services.      Intensity: Minumum of 1-2 x/day, 30 to 90 minutes Frequency: 3 to 5 out of 7 days Duration/Length of Stay: 18 to 21 days Treatment/Interventions: Cognitive remediation/compensation;Cueing hierarchy;Environmental controls;Functional tasks;Patient/family education;Medication managment;Dysphagia/aspiration precaution training;Therapeutic Activities   Daily Session  Skilled Therapeutic Interventions: Skilled ST treatment session focused on cognition goals. Daughter present during session. SLP facilitated session by providing Max A overall cues for sustained attention to task for ~2 minute intervals. Pt required Max A verbal cues to state purpose of common objects previously named in session. Pt required Max to initiate all tasks within session. Pt was left in dayroom, upright in wheelchair in daughter's care. Continue current plan of care.  Function:   Eating Eating   Modified Consistency Diet: No Eating Assist Level: Set up assist for;Helper scoops food on utensil;Helper brings food to mouth;Hand over hand assist;Help managing cup/glass;Help  with picking up utensils;More than reasonable amount of time   Eating Set Up Assist For: Opening containers Helper Scoops Food on Utensil: Occasionally Helper Brings Food to Mouth: Occasionally   Cognition Comprehension Comprehension assist level: Understands basic 50 - 74% of the time/ requires cueing 25 - 49% of the time  Expression   Expression assist level: Expresses basic 25 - 49% of the time/requires cueing 50 - 75% of the time. Uses single words/gestures.  Social Interaction Social Interaction assist level: Interacts appropriately 25 - 49% of time - Needs frequent redirection.  Problem Solving Problem solving assist level: Solves basic 25 - 49% of the time - needs direction more than half the time to initiate, plan or complete simple activities  Memory Memory assist level: Recognizes or recalls 25 - 49% of the time/requires cueing 50 - 75% of the time   General    Pain    Therapy/Group: Individual Therapy   Rillie Riffel B. Rutherford Nail, M.S., CCC-SLP Speech-Language Pathologist   Gayland Nicol Rutherford Nail 04/18/2016, 3:24 PM

## 2016-04-18 NOTE — Progress Notes (Signed)
Physical Therapy Session Note  Patient Details  Name: [name] MRN: [place]:[id number] Date of Birth: [birth year]  Today's Date: 04/18/2016 PT Individual Time: 0805-0900 PT Individual Time Calculation (min): 55 min   Short Term Goals: Week 2:  PT Short Term Goal 1 (Week 2): Pt will perform bed mobility with mod A using hospital bed functions. PT Short Term Goal 2 (Week 2): Patient will transfer sit <> stand with mod A 50% of available opportunities.  PT Short Term Goal 3 (Week 2): Pt will transfer bed <> wheelchair with mod A 25% of available opportunities.   Skilled Therapeutic Interventions/Progress Updates:   Patient reclined in bed attempting to eat breakfast with tray above him. Transferred supine > sit with use of rail and HOB raised with max A to bring BLE off bed and elevate trunk, S-min A static sitting balance with verbal cues to orient to midline due to lateral lean to R. Performed squat pivot to wheelchair to L with max A. Seated in wheelchair, patient self fed all of fruit bowl with initial assist to grasp fork with LUE and declined further breakfast. Daughter Velna Hatchet present to observe session. Performed sit <> stand transfers from wheelchair at rail in hallway with mod A. Gait training using L rail in hallway 2 x 15 ft with mod-max A to maintain standing balance due to pushing to R and max verbal/visual cues to advance LUE along rail and sequence gait pattern with patient demonstrating improved ability to advance/place RLE with min guidance. Patient did not verbalize bowel incontinence but with questioning agreed for need to toilet. Returned to room and utilized [employer] with S-min A for toilet transfer and standing balance/tolerance with BUE support on Stedy during total A hygiene. Patient requested to stay on toilet and left in bathroom with daughter to provide supervision until OT session immediately following.   Therapy Documentation Precautions:  Precautions Precautions:  Fall Restrictions Weight Bearing Restrictions: No Pain: Pain Assessment Pain Assessment: Faces Faces Pain Scale: No hurt  See Function Navigator for Current Functional Status.   Therapy/Group: Individual Therapy  Jayna Mulnix, Murray Hodgkins 04/18/2016, 9:42 AM

## 2016-04-18 NOTE — Plan of Care (Signed)
Problem: RH Car Transfers Goal: LTG Patient will perform car transfers with assist (PT) LTG: Patient will perform car transfers with assistance (PT).  Outcome: Not Applicable Date Met: 95/09/32 DC planned to SNF

## 2016-04-18 NOTE — Progress Notes (Signed)
[place] PHYSICAL MEDICINE & REHABILITATION     PROGRESS NOTE  Subjective/Complaints:  Pt seen laying in bed this AM.  He slept well overnight.  When asked about whether he enjoyed Neuropsych yesterday, he states "I guess", but when asked if he would like Neuropsych follow up, he states "yes".   ROS: Denies CP, SOB, N/V/D.Marland Kitchen  Objective: Vital Signs: Blood pressure (!) 151/74, pulse 75, temperature 98.6 F (37 C), temperature source Oral, resp. rate 17, height 5\' 9"  (1.753 m), weight 110.7 kg (244 lb), SpO2 97 %. No results found.  Recent Labs  04/18/16 0441  WBC 8.4  HGB 12.6*  HCT 40.0  PLT 344    Recent Labs  04/16/16 0255 04/18/16 0441  NA 144 145  K 3.5 3.8  CL 108 105  GLUCOSE 102* 196*  BUN 37* 36*  CREATININE 1.47* 1.43*  CALCIUM 9.2 9.8   CBG (last 3)   Recent Labs  04/17/16 1632 04/17/16 2027 04/18/16 0649  GLUCAP 260* 164* 178*    Wt Readings from Last 3 Encounters:  04/18/16 110.7 kg (244 lb)  03/28/16 111.1 kg (245 lb)  09/04/13 120.2 kg (265 lb)    Physical Exam:  BP (!) 151/74 (BP Location: Right Arm)   Pulse 75   Temp 98.6 F (37 C) (Oral)   Resp 17   Ht 5\' 9"  (1.753 m)   Wt 110.7 kg (244 lb)   SpO2 97%   BMI 36.03 kg/m  Constitutional: He appears well-developed. Obese. HENT: Normocephalic and atraumatic.  Eyes: EOM are normal. No discharge.  Cardiovascular: RRR. No JVD. Respiratory: Effort normal and breath sounds normal.  GI: Soft. Bowel sounds are normal.  Musculoskeletal: He exhibits no edema. He exhibits no tenderness.  Neurological: Keeps eyes closed. Dysarthric  Limited vision.  Poor insight and awareness.    Motor: 4+/5 RUE prox to distal. Ataxia (unchanged).  RLE: 4/+/5 proximal to distal  LLE 4+ /5 proximal to distal.  LUE/LLE proximal to distal 5/5.  Skin: Warm and dry. B/l DTIs.  Psychiatric: Flat affect. Limited initiation.   Assessment/Plan: 1. Functional deficits secondary to left paramedian pontine  infarct which require 3+ hours per day of interdisciplinary therapy in a comprehensive inpatient rehab setting. Physiatrist is providing close team supervision and 24 hour management of active medical problems listed below. Physiatrist and rehab team continue to assess barriers to discharge/monitor patient progress toward functional and medical goals.  Function:  Bathing Bathing position   Position: Wheelchair/chair at sink  Bathing parts Body parts bathed by patient: Right arm, Left arm, Chest, Abdomen, Left upper leg, Right upper leg Body parts bathed by helper: Left lower leg, Right lower leg, Buttocks, Front perineal area  Bathing assist Assist Level: Touching or steadying assistance(Pt > 75%)      Upper Body Dressing/Undressing Upper body dressing   What is the patient wearing?: Pull over shirt/dress     Pull over shirt/dress - Perfomed by patient: Pull shirt over trunk, Put head through opening Pull over shirt/dress - Perfomed by helper: Thread/unthread right sleeve, Thread/unthread left sleeve Button up shirt - Perfomed by patient: Thread/unthread left sleeve, Thread/unthread right sleeve Button up shirt - Perfomed by helper: Pull shirt around back, Button/unbutton shirt    Upper body assist Assist Level: Touching or steadying assistance(Pt > 75%)      Lower Body Dressing/Undressing Lower body dressing   What is the patient wearing?: Pants       Pants- Performed by helper: Thread/unthread right pants  leg, Thread/unthread left pants leg, Pull pants up/down   Non-skid slipper socks- Performed by helper: Don/doff left sock, Don/doff right sock       Shoes - Performed by helper: Don/doff right shoe, Don/doff left shoe, Fasten right, Fasten left   AFO - Performed by helper: Don/doff right AFO, Don/doff left AFO (ACE wraps)   TED Hose - Performed by helper: Don/doff right TED hose, Don/doff left TED hose  Lower body assist Assist for lower body dressing: Touching or  steadying assistance (Pt > 75%)      Toileting Toileting Toileting activity did not occur: No continent bowel/bladder event   Toileting steps completed by helper: Adjust clothing prior to toileting, Performs perineal hygiene, Adjust clothing after toileting Toileting Assistive Devices: Other (comment) (urinal)  Toileting assist Assist level: Touching or steadying assistance (Pt.75%)   Transfers Chair/bed transfer   Chair/bed transfer method: Squat pivot Chair/bed transfer assist level: Maximal assist (Pt 25 - 49%/lift and lower) Chair/bed transfer assistive device: Armrests Mechanical lift: Stedy   Locomotion Ambulation     Max distance: 20 ft Assist level: 2 helpers   Wheelchair   Type: Manual Max wheelchair distance: 50 ft Assist Level: Dependent (Pt equals 0%)  Cognition Comprehension Comprehension assist level: Understands basic 25 - 49% of the time/ requires cueing 50 - 75% of the time  Expression Expression assist level: Expresses basic 25 - 49% of the time/requires cueing 50 - 75% of the time. Uses single words/gestures.  Social Interaction Social Interaction assist level: Interacts appropriately 25 - 49% of time - Needs frequent redirection.  Problem Solving Problem solving assist level: Solves basic 25 - 49% of the time - needs direction more than half the time to initiate, plan or complete simple activities  Memory Memory assist level: Recognizes or recalls 25 - 49% of the time/requires cueing 50 - 75% of the time    Medical Problem List and Plan: 1.  Right hemiparesis and functional deficits secondary to left paramedian pontine infarct on 1/31  Cont CIR  Baclofen started for spasms d/ced on 2/14 due to lethargy with some improvement 2. H/o DVT /Anticoagulation: Pharmaceutical: Xarelto 3. Pain Management:   Lidoderm patch started due to therapy reports of pain - patient states improved, however, ?reliability.  4. Mood: LCSW to follow for evaluation and support.    Fluoxetine started 2/15  Trial amantadine on 2/19, with improvement 5. Neuropsych: This patient is not yet capable of making decisions on his own behalf.  Appreciate Neuropsych eval  6. Skin/Wound Care: Routine pressure relief measures. Eucerin to b/l LE.  Prevalon boots ordered for DTI 7. Fluids/Electrolytes/Nutrition: Monitor I/Os  Intake has been variable affecting CBGs  Remeron added 2/15, with improvement 8. HTN: Monitor BP.   Continue coreg, cozaar, Bidil and lasix.     Chlorthalidone increased on 2/8, again on 2/12, increased again on 2/15 9. T2DM with retinopathy and neuropathy: Blood sugars poorly controlled with A1C- 11.0. Consult RD to educated on [place]. Monitor BS ac/hs.   Novolog 70/30 30 BID   Novalog decreased to 2U TID, increased to 5U on 2/21  qHS snack added 2/14  Remains extremely labile,  10. CAD/ Acute systolic CHF/NICM: compensated. Check daily weights and monitor for signs of overload. Heart healthy/low salt diet. Will need perfusion study on outpatient basis per cardiology. Cont meds Filed Weights   04/16/16 0627 04/18/16 0655 04/18/16 0737  Weight: 115.1 kg (253 lb 12 oz) 112.5 kg (248 lb) 110.7 kg (244 lb)  Clinically stable  ?Trending up 11. Morbid Obesity: BMI - 36. Encourage appropriate diet, weight loss and exercise.  12. CKD:   Cr. 1.43 on 2/21 (around baseline)  Encourage fluids  IVF qhs at 50cc/hr, d/ced 2/20 13. Hypokalemia:   Increased K+ supplement 2/9  K+ 3.8 on 2/21  Continue supplement 14. ABLA  Hb 12.6 on 2/21  Cont to monitor 15. Acute lower UTI  UA+, Ucx with Pseudomonas and enterococcus  Spoke with pharmacy, macrobid changed to Cipro, completed 2/12-2/18  LOS (Days) 15 A FACE TO FACE EVALUATION WAS PERFORMED  Breckan Cafiero Lorie Phenix 04/18/2016 7:42 AM

## 2016-04-18 NOTE — Progress Notes (Signed)
Occupational Therapy Weekly Progress Note  Patient Details  Name: [name] MRN: [id number] Date of Birth: Dec 12, 1955  Beginning of progress report period: April 04, 2016 End of progress report period: April 18, 2016  Today's Date: 04/18/2016 OT Individual Time: 0900-1000 OT Individual Time Calculation (min): 60 min   Patient has met 1 of 3 short term goals.  Pt's progress is very limited by his low arousal and difficulty maintaining alertness to participate in self-care tasks. However, pt has made slightly more progress with OT in the last few days- He is initiating use of R UE during familiar ADL tasks 15% and has consistently been more participatory in therapy. R UE ataxia has improved this week as pt is able to grasp wash cloth with more functional grasp and maintain grasp while washing. He continues to require max multimodal cues to initiate and sequence activity, plan remains for skilled nursing facility placement for continued OT in next venue of care.   Patient continues to demonstrate the following deficits: muscle weakness, impaired timing and sequencing, unbalanced muscle activation, ataxia, decreased coordination and decreased motor planning, decreased visual acuity and decreased visual perceptual skills, decreased midline orientation and decreased attention to right, decreased initiation, decreased attention, decreased awareness, decreased problem solving, decreased safety awareness, decreased memory and delayed processing and decreased sitting balance, decreased standing balance, decreased postural control and decreased balance strategies and therefore will continue to benefit from skilled OT intervention to enhance overall performance with BADL and Reduce care partner burden.  Patient progressing toward long term goals..  Continue plan of care.  OT Short Term Goals Week 2:  OT Short Term Goal 1 (Week 2): Pt will complete LB dressing with max assist of one caregiver OT Short  Term Goal 1 - Progress (Week 2): Met OT Short Term Goal 2 (Week 2): Pt will complete sit > stand with mod assist to decrease burden of care with LB dressing/hygiene OT Short Term Goal 2 - Progress (Week 2): Not met OT Short Term Goal 3 (Week 2): Pt will complete functional transfer with LRAD to [facility] with Mod A of 1 to decrease caregiver burden OT Short Term Goal 3 - Progress (Week 2): Not met Week 3:  OT Short Term Goal 1 (Week 3): Pt will utilize R UE during functional activity 25% of the time with min verbal cues OT Short Term Goal 2 (Week 3): Pt will maintain midline posture during grooming task at the sink for 2 mins with mod verbal cues OT Short Term Goal 3 (Week 3): Pt will don shirt using hemi-techniques with Mod A of 1  Skilled Therapeutic Interventions/Progress Updates:    Pt greeted on toilet in stedy upon OT arrival for handoff from Pt. Max A sit<>stand from toilet, Total A for peri-care and clothing management s/p Bm. OT session focused on modified bathing/dressing and functional use of R Ue. Addressed visual scanning and R side attention to locate items on R side of sink. Improved arousal today and able to answer questions 90% of the time. Mod/max multimodal cues to integrate R UE into bathing/dressing tasks, but improved initiation to utilize R UE during self- care. Addressed trunk control and midline orientation using mirror feedback and daughter providing point of reference. Pt able to assist with threading pants today, but still required max multimodal cues and Max A to complete task successfully. Pt left seated in TIS wc at end of session with needs met, safety belt on, and daughter present.   Therapy  Documentation Precautions:  Precautions Precautions: Fall Restrictions Weight Bearing Restrictions: No Pain: Pain Assessment Pain Assessment: Faces Faces Pain Scale: No hurt ADL: ADL ADL Comments: Please see functional navigator  See Function Navigator for Current Functional  Status.   Therapy/Group: Individual Therapy  Valma Cava 04/18/2016, 12:54 PM

## 2016-04-19 ENCOUNTER — Inpatient Hospital Stay (HOSPITAL_COMMUNITY): Payer: Medicare Other | Admitting: Physical Therapy

## 2016-04-19 ENCOUNTER — Inpatient Hospital Stay (HOSPITAL_COMMUNITY): Payer: Medicare Other | Admitting: Occupational Therapy

## 2016-04-19 ENCOUNTER — Inpatient Hospital Stay (HOSPITAL_COMMUNITY): Payer: Medicare Other | Admitting: Speech Pathology

## 2016-04-19 ENCOUNTER — Inpatient Hospital Stay (HOSPITAL_COMMUNITY): Payer: Medicare Other

## 2016-04-19 LAB — GLUCOSE, CAPILLARY
GLUCOSE-CAPILLARY: 175 mg/dL — AB (ref 65–99)
GLUCOSE-CAPILLARY: 118 mg/dL — AB (ref 65–99)
Glucose-Capillary: 172 mg/dL — ABNORMAL HIGH (ref 65–99)
Glucose-Capillary: 181 mg/dL — ABNORMAL HIGH (ref 65–99)

## 2016-04-19 MED ORDER — INSULIN ASPART PROT & ASPART (70-30 MIX) 100 UNIT/ML ~~LOC~~ SUSP
35.0000 [IU] | Freq: Every day | SUBCUTANEOUS | Status: DC
  Administered 2016-04-19 – 2016-04-22 (×4): 35 [IU] via SUBCUTANEOUS

## 2016-04-19 MED ORDER — POTASSIUM CHLORIDE 20 MEQ/15ML (10%) PO SOLN
40.0000 meq | Freq: Two times a day (BID) | ORAL | Status: DC
  Administered 2016-04-20 – 2016-04-24 (×9): 40 meq via ORAL
  Filled 2016-04-19 (×9): qty 30

## 2016-04-19 MED ORDER — ACETAMINOPHEN 160 MG/5ML PO SOLN
325.0000 mg | ORAL | Status: DC | PRN
  Administered 2016-04-22 (×2): 649.6 mg via ORAL
  Filled 2016-04-19 (×2): qty 20.3

## 2016-04-19 MED ORDER — AMANTADINE HCL 50 MG/5ML PO SYRP
100.0000 mg | ORAL_SOLUTION | Freq: Two times a day (BID) | ORAL | Status: DC
  Administered 2016-04-20 – 2016-04-23 (×8): 100 mg via ORAL
  Filled 2016-04-19 (×10): qty 10

## 2016-04-19 MED ORDER — AMLODIPINE BESYLATE 5 MG PO TABS
5.0000 mg | ORAL_TABLET | Freq: Every day | ORAL | Status: DC
  Administered 2016-04-19 – 2016-04-22 (×4): 5 mg via ORAL
  Filled 2016-04-19 (×4): qty 1

## 2016-04-19 NOTE — Progress Notes (Signed)
Social Work Elease Hashimoto, LCSW Social Worker Signed   Patient Care Conference Date of Service: 04/19/2016  9:41 AM      Hide copied text Hover for attribution information Inpatient RehabilitationTeam Conference and Plan of Care Update Date: 04/18/2016   Time: 11:15 AM      Patient Name: [name]      Medical Record Number: [id number]  Date of Birth: 1955-10-02 Sex: Male         Room/Bed: 4M03C/4M03C-01 Payor Info: Payor: MEDICARE / Plan: MEDICARE PART A AND B / Product Type: *No Product type* /     Admitting Diagnosis: CVA  Admit Date/Time:  04/03/2016  4:08 PM Admission Comments: No comment available    Primary Diagnosis:  <principal problem not specified> Principal Problem: <principal problem not specified>       Patient Active Problem List    Diagnosis Date Noted  . Cognitive and behavioral changes    . Cerebrovascular accident (CVA) due to embolism of left posterior cerebral artery ([place])    . Lethargy    . AKI (acute kidney injury) ([place])    . Low back pain    . Leukocytosis    . Labile blood glucose    . Hypoglycemia due to insulin    . Acute lower UTI    . Deep tissue injury    . Renovascular hypertension    . History of DVT (deep vein thrombosis)    . Coronary artery disease involving native coronary artery of native heart without angina pectoris    . Morbid obesity ([place])    . Stage 2 chronic kidney disease    . Hypokalemia    . Acute blood loss anemia    . Embolic stroke involving posterior cerebral artery ([place]) 04/03/2016  . Brainstem stroke ([place]) 04/01/2016  . Abnormality of gait and mobility    . Cerebral infarction due to thrombosis of other cerebral artery ([place])    . Right arm numbness    . Left sided numbness 03/29/2016  . TIA (transient ischemic attack) 03/29/2016  . Hypertensive urgency 03/29/2016  . Sepsis ([place]) 09/10/2013  . DVT (deep venous thrombosis) ([place]) 01/01/2013  . Rash and nonspecific skin eruption 12/29/2012  . Cellulitis and  abscess of leg 12/29/2012  . Diabetes type 2, uncontrolled ([place]) 12/29/2012  . Hypertension    . Cardiomyopathy ([place])    . CHF (congestive heart failure) ([place])    . Peripheral neuropathy ([place])    . Special screening for malignant neoplasms, colon 12/09/2012      Expected Discharge Date:     Team Members Present: Physician leading conference: Dr. Delice Lesch Social Worker Present: Ovidio Kin, LCSW Nurse Present: Heather Roberts, RN PT Present: Carney Living, PT OT Present: Cherylynn Ridges, OT SLP Present: Other (comment) (happi Overton-SP) PPS Coordinator present : Daiva Nakayama, RN, CRRN       Current Status/Progress Goal Weekly Team Focus  Medical     Right hemiparesis and functional deficits secondary to left paramedian pontine infarct on 1/31  Improve mobility, initation, cognition, HTN, DM  See above   Bowel/Bladder     Incontinent of bowel and bladder. LBM 04/14/16. Miralax given but not effective. I&O cath  patient to be able to urinate on his own  Timed toileting q 2 hrs.   Swallow/Nutrition/ Hydration     Min A  Min A  addressed not using straws to eliminate cough with thin liquids, no overt s/s of aspiration via cup  ADL's     Max A overall, sligtly higher arousal   Min A overall  R side attention, R NMR, midline orientation, improved sit<>stand, functional use of R UE   Mobility     max A bed mobility and transfers, +2 short distance gait for w/c follow  min A overall  functional mobility training, R NMR, coordination, postural control, midline orientation, balance, activity tolerance, arousal, participation, attention   Communication     Max A for any level of verbal response  Mod A for phrase level  implemented multimodal activites and verbal choices to increase response   Safety/Cognition/ Behavioral Observations   Max A  Mod A  focused on sustained attention for 1 minute, following simple 1 step directions   Pain     No complain of pain  <2  Assess and treat pain q  shift and as needed   Skin     Secrum is red covered with foam, DTI both hels, prevalon boots all times  Skin to be free futher breakdown/infection  Assess and treat any skin issues q shift and as needed.     *See Care Plan and progress notes for long and short-term goals.   Barriers to Discharge: Cognition, safety, HTN, DM, CKD, mobility     Possible Resolutions to Barriers:  Therapies, optimize HTN, DM meds, follow labs     Discharge Planning/Teaching Needs:  Daughter here Tuesday to observe pt in therapies, to try to see if he woulod participate more. Will begin looking for NH bed.      Team Discussion:  Participating in therapies more so this week. Feeling better after treatment for UTI and air bed now for flank pain. Adjusting diabetes and BP meds. I & O cath now. Daughter here this week to assist with po intake and encourage to participate. Neuro-psych seeing for support. Begin the NH bed search  Revisions to Treatment Plan:  NHP    Continued Need for Acute Rehabilitation Level of Care: The patient requires daily medical management by a physician with specialized training in physical medicine and rehabilitation for the following conditions: Daily direction of a multidisciplinary physical rehabilitation program to ensure safe treatment while eliciting the highest outcome that is of practical value to the patient.: Yes Daily medical management of patient stability for increased activity during participation in an intensive rehabilitation regime.: Yes Daily analysis of laboratory values and/or radiology reports with any subsequent need for medication adjustment of medical intervention for : Neurological problems;Diabetes problems;Blood pressure problems;Renal problems;Other   Elease Hashimoto 04/19/2016, 9:41 AM      Elease Hashimoto, LCSW Social Worker Signed   Patient Care Conference Date of Service: 04/11/2016  3:05 PM      Hide copied text Hover for attribution  information Inpatient RehabilitationTeam Conference and Plan of Care Update Date: 04/11/2016   Time: 10:45 AM      Patient Name: [name]      Medical Record Number: [id number]  Date of Birth: 1955/03/11 Sex: Male         Room/Bed: 4M03C/4M03C-01 Payor Info: Payor: MEDICARE / Plan: MEDICARE PART A AND B / Product Type: *No Product type* /     Admitting Diagnosis: CVA  Admit Date/Time:  04/03/2016  4:08 PM Admission Comments: No comment available    Primary Diagnosis:  <principal problem not specified> Principal Problem: <principal problem not specified>       Patient Active Problem List    Diagnosis Date Noted  .  Low back pain    . Leukocytosis    . Labile blood glucose    . Hypoglycemia due to insulin    . Acute lower UTI    . Deep tissue injury    . Renovascular hypertension    . History of DVT (deep vein thrombosis)    . Coronary artery disease involving native coronary artery of native heart without angina pectoris    . Morbid obesity ([place])    . Stage 2 chronic kidney disease    . Hypokalemia    . Acute blood loss anemia    . Embolic stroke involving posterior cerebral artery ([place]) 04/03/2016  . Brainstem stroke ([place]) 04/01/2016  . Abnormality of gait and mobility    . Cerebral infarction due to thrombosis of other cerebral artery ([place])    . Right arm numbness    . Left sided numbness 03/29/2016  . TIA (transient ischemic attack) 03/29/2016  . Hypertensive urgency 03/29/2016  . Sepsis ([place]) 09/10/2013  . DVT (deep venous thrombosis) ([place]) 01/01/2013  . Rash and nonspecific skin eruption 12/29/2012  . Cellulitis and abscess of leg 12/29/2012  . Diabetes type 2, uncontrolled ([place]) 12/29/2012  . Hypertension    . Cardiomyopathy ([place])    . CHF (congestive heart failure) ([place])    . Peripheral neuropathy ([place])    . Special screening for malignant neoplasms, colon 12/09/2012      Expected Discharge Date:     Team Members Present: Physician leading conference:  Dr. Delice Lesch Social Worker Present: Ovidio Kin, LCSW Nurse Present: Heather Roberts, RN PT Present: Carney Living, PT OT Present: Willeen Cass, OT SLP Present: Other (comment) (Happi Overton-SP) PPS Coordinator present : Daiva Nakayama, RN, CRRN       Current Status/Progress Goal Weekly Team Focus  Medical     Right hemiparesis and functional deficits secondary to left paramedian pontine infarct on 1/31  Improve mobility, cognition, safety, HTN, DM, UTI  See above   Bowel/Bladder     incontinent of bladder at times and continent of [place]  toileting him every 2 hours ,help with urinal  assistance with urinal  more often to keep him continent   Swallow/Nutrition/ Hydration               ADL's     Max A overall, Mod A UB dressing  Min A overall  modified bathing/dressing, R NMR, R attention, functional transfers, functional communication, sitting balance, improved sit<>stand   Mobility     max A bed mobility and transfers, S sitting balance, +2 gait for w/c follow  min A overall  functional mobility training, R NMR, coordination, postural control, midline reorientation, sitting > standing balance, activity tolerance, attention   Communication     Mod A  Supervision  continue addressing speech intelligiblity, word finding strategies   Safety/Cognition/ Behavioral Observations   Max A  Min A  focus on sustaine dattention for 10 minutes, following simple 1 step directions, basic problem solving   Pain     less<4  assess and adress it every 4 hours  pain free   Skin     sacrum is red covered by foam, both heels got deep tissue injuries covered by foams,prevalon boots on all the time on he is the bed, air matress bed  monitor every shift   monitor, assess and adress every shift and as need it     *See Care Plan and progress notes for long and short-term goals.   Barriers  to Discharge: Cognition, safety, HTN, DM, CKD, UTI     Possible Resolutions to Barriers:  Therapies, optimize  HTN, DM meds, follow labs abx      Discharge Planning/Teaching Needs:  Pt's plan is to go to a NH from here due to has no caregivers. Daughter's will check in and out but work and have families. Daughter to apply for medicaid while pt here.      Team Discussion:  Making progress slowly-starting to refuse and not participate fully-have asked neuro-psych to see question depression. MD adjusting diabetes and HTN meds. Had to I & O cath yesterday due to retaining. UTI being treated for. Pain in bottom-MD made aware and prescribed lidoderm patch and will try air mattress. Push po fluids too dry.   Revisions to Treatment Plan:  Plan for NHP due to no caregivers.    Continued Need for Acute Rehabilitation Level of Care: The patient requires daily medical management by a physician with specialized training in physical medicine and rehabilitation for the following conditions: Daily direction of a multidisciplinary physical rehabilitation program to ensure safe treatment while eliciting the highest outcome that is of practical value to the patient.: Yes Daily medical management of patient stability for increased activity during participation in an intensive rehabilitation regime.: Yes Daily analysis of laboratory values and/or radiology reports with any subsequent need for medication adjustment of medical intervention for : Neurological problems;Diabetes problems;Blood pressure problems;Renal problems;Other   Elease Hashimoto 04/12/2016, 8:37 AM      Elease Hashimoto, LCSW Social Worker Signed   Patient Care Conference Date of Service: 04/05/2016  8:23 AM      Hide copied text Hover for attribution information Inpatient RehabilitationTeam Conference and Plan of Care Update Date: 04/04/2016   Time: 2:20 PM      Patient Name: BANDY YACONO      Medical Record Number: [id number]  Date of Birth: [birth year] Sex: Male         Room/Bed: 4M03C/4M03C-01 Payor Info: Payor: MEDICARE / Plan: MEDICARE PART A AND  B / Product Type: *No Product type* /     Admitting Diagnosis: CVA  Admit Date/Time:  04/03/2016  4:08 PM Admission Comments: No comment available    Primary Diagnosis:  <principal problem not specified> Principal Problem: <principal problem not specified>       Patient Active Problem List    Diagnosis Date Noted  . Renovascular hypertension    . History of DVT (deep vein thrombosis)    . Coronary artery disease involving native coronary artery of native heart without angina pectoris    . Morbid obesity ([place])    . Stage 2 chronic kidney disease    . Hypokalemia    . Acute blood loss anemia    . Embolic stroke involving posterior cerebral artery ([place]) 04/03/2016  . Brainstem stroke ([place]) 04/01/2016  . Abnormality of gait and mobility    . Cerebral infarction due to thrombosis of other cerebral artery ([place])    . Right arm numbness    . Left sided numbness 03/29/2016  . TIA (transient ischemic attack) 03/29/2016  . Hypertensive urgency 03/29/2016  . Sepsis ([place]) 09/10/2013  . DVT (deep venous thrombosis) ([place]) 01/01/2013  . Rash and nonspecific skin eruption 12/29/2012  . Cellulitis and abscess of leg 12/29/2012  . Diabetes type 2, uncontrolled ([place]) 12/29/2012  . Hypertension    . Cardiomyopathy ([place])    . CHF (congestive heart failure) ([place])    . Peripheral neuropathy ([place])    .  Special screening for malignant neoplasms, colon 12/09/2012      Expected Discharge Date:     Team Members Present: Physician leading conference: Dr. Delice Lesch Social Worker Present: Ovidio Kin, LCSW Nurse Present: Heather Roberts, RN PT Present: Carney Living, PT OT Present: Cherylynn Ridges, OT SLP Present: Other (comment) (Happi Overton-SP) PPS Coordinator present : Daiva Nakayama, RN, CRRN       Current Status/Progress Goal Weekly Team Focus  Medical     Right hemiparesis and functional deficits secondary to left paramedian pontine infarct  Improve mobility, cogntion, safety, comorbidities  See  above   Bowel/Bladder          eval pending     Swallow/Nutrition/ Hydration               ADL's     Max+2 overall,   Min A overall  pt/family ed, attention, awareness,    Mobility     max +2A  min A overall  functional mobility training, RUE/RLE NMR, coordination, postural control, midline reorientation, sitting/standing balance, activity tolerance, pt/fam education   Communication               Safety/Cognition/ Behavioral Observations             Pain          less than 3     Skin          skin issues form diabetes       *See Care Plan and progress notes for long and short-term goals.   Barriers to Discharge: Cognition, safety, HTN, DM, CKD, hypokalemia, ABLA, CHF     Possible Resolutions to Barriers:  Therapies, optimize HTN, DM meds, follow labs and weights     Discharge Planning/Teaching Needs:    Does not have 24 hr care will need to go to a NH upon discharge from rehab     Team Discussion:  New eval-goals overall min assist level. Currently plus 2 total. Skin issues-RN & MD aware working on. Delayed processing and motor coordination. MD monitoring labs  Revisions to Treatment Plan:  New eval    Continued Need for Acute Rehabilitation Level of Care: The patient requires daily medical management by a physician with specialized training in physical medicine and rehabilitation for the following conditions: Daily direction of a multidisciplinary physical rehabilitation program to ensure safe treatment while eliciting the highest outcome that is of practical value to the patient.: Yes Daily medical management of patient stability for increased activity during participation in an intensive rehabilitation regime.: Yes Daily analysis of laboratory values and/or radiology reports with any subsequent need for medication adjustment of medical intervention for : Neurological problems;Diabetes problems;Blood pressure problems;Renal problems;Other   Elease Hashimoto 04/05/2016,  8:23 AM       Patient ID: [name], male   DOB: 03/20/1955, 61 y.o.   MRN: [id number]

## 2016-04-19 NOTE — NC FL2 (Signed)
[place] MEDICAID FL2 LEVEL OF CARE SCREENING TOOL     IDENTIFICATION  Patient Name: [name] Birthdate: Jul 19, 1955 Sex: male Admission Date (Current Location): 04/03/2016  [facility] and [place] Number:  Herbalist and Address:  [facility], [place] [address] [place], [place] [address]      Provider Number: [id number]  Attending Physician Name and Address:  Ankit Lorie Phenix, MD  Relative Name and Phone Number:  Grafton Folk [phone]    Current Level of Care: Other (Comment) (rehab) Recommended Level of Care: [place] Prior Approval Number:    Date Approved/Denied:   PASRR Number: [id number]  Discharge Plan: SNF    Current Diagnoses: Patient Active Problem List   Diagnosis Date Noted  . Cognitive and behavioral changes   . Cerebrovascular accident (CVA) due to embolism of left posterior cerebral artery ([place])   . Lethargy   . AKI (acute kidney injury) ([place])   . Low back pain   . Leukocytosis   . Labile blood glucose   . Hypoglycemia due to insulin   . Acute lower UTI   . Deep tissue injury   . Renovascular hypertension   . History of DVT (deep vein thrombosis)   . Coronary artery disease involving native coronary artery of native heart without angina pectoris   . Morbid obesity ([place])   . Stage 2 chronic kidney disease   . Hypokalemia   . Acute blood loss anemia   . Embolic stroke involving posterior cerebral artery ([place]) 04/03/2016  . Brainstem stroke ([place]) 04/01/2016  . Abnormality of gait and mobility   . Cerebral infarction due to thrombosis of other cerebral artery ([place])   . Right arm numbness   . Left sided numbness 03/29/2016  . TIA (transient ischemic attack) 03/29/2016  . Hypertensive urgency 03/29/2016  . Sepsis ([place]) 09/10/2013  . DVT (deep venous thrombosis) ([place]) 01/01/2013  . Rash and nonspecific skin eruption 12/29/2012  . Cellulitis and abscess of leg 12/29/2012  . Diabetes  type 2, uncontrolled ([place]) 12/29/2012  . Hypertension   . Cardiomyopathy ([place])   . CHF (congestive heart failure) ([place])   . Peripheral neuropathy ([place])   . Special screening for malignant neoplasms, colon 12/09/2012    Orientation RESPIRATION BLADDER Height & Weight     Self, Situation, Place, Time  Normal External catheter (I & O cath every 6 hr if no void) Weight: 243 lb (110.2 kg) Height:  5\' 9"  (175.3 cm)  BEHAVIORAL SYMPTOMS/MOOD NEUROLOGICAL BOWEL NUTRITION STATUS      Continent Diet (Carb Modified)  AMBULATORY STATUS COMMUNICATION OF NEEDS Skin   Extensive Assist Verbally Normal                       Personal Care Assistance Level of Assistance  Bathing, Feeding, Dressing Bathing Assistance: Limited assistance Feeding assistance: Limited assistance Dressing Assistance: Limited assistance     Functional Limitations Info  Speech     Speech Info: Impaired    SPECIAL CARE FACTORS FREQUENCY  PT (By licensed PT), OT (By licensed OT), Speech therapy, Bowel and bladder program     PT Frequency: 5 x week OT Frequency: 5 x week Bowel and Bladder Program Frequency: Checking PVR's regarding need for I & O caths   Speech Therapy Frequency: 5 x week      Contractures Contractures Info: Not present    Additional Factors Info  Code Status, Allergies Code Status Info: Full  Allergies Info: Lohexol, Ivp dye Iodinated Agents           Current Medications (04/19/2016):  This is the current hospital active medication list Current Facility-Administered Medications  Medication Dose Route Frequency Provider Last Rate Last Dose  . acetaminophen (TYLENOL) tablet 325-650 mg  325-650 mg Oral Q4H PRN DELMONTE YERK, PA-C   650 mg at 04/13/16 2239  . alum & mag hydroxide-simeth (MAALOX/MYLANTA) 200-200-20 MG/5ML suspension 30 mL  30 mL Oral Q4H PRN Bary Leriche, PA-C      . amantadine (SYMMETREL) capsule 100 mg  100 mg Oral BID WC Ivan Anchors Horkey, PA-C   100 mg at 04/19/16 [id number]   . amLODipine (NORVASC) tablet 5 mg  5 mg Oral Daily Ankit Lorie Phenix, MD   5 mg at 04/19/16 0902  . atorvastatin (LIPITOR) tablet 40 mg  40 mg Oral q1800 Ivan Anchors Geiselman, PA-C   40 mg at 04/18/16 1801  . bisacodyl (DULCOLAX) suppository 10 mg  10 mg Rectal Daily PRN RACIEL RIERSON, PA-C   10 mg at 04/18/16 [id number]  . carvedilol (COREG) tablet 25 mg  25 mg Oral BID WC Ivan Anchors [place]waltney, PA-C   25 mg at 04/19/16 0901  . chlorthalidone (HYGROTON) tablet 75 mg  75 mg Oral Daily Ankit Lorie Phenix, MD   75 mg at 04/19/16 0906  . cloNIDine (CATAPRES) tablet 0.3 mg  0.3 mg Oral TID Bary Leriche, PA-C   0.3 mg at 04/19/16 [id number]  . diphenhydrAMINE (BENADRYL) 12.5 MG/5ML elixir 12.5-25 mg  12.5-25 mg Oral Q6H PRN Ivan Anchors Olivero, PA-C      . feeding supplement (GLUCERNA SHAKE) (GLUCERNA SHAKE) liquid 237 mL  237 mL Oral TID BM Ankit Lorie Phenix, MD   237 mL at 04/19/16 1039  . feeding supplement (PRO-STAT SUGAR FREE 64) liquid 30 mL  30 mL Oral BID Ivan Anchors Wohl, PA-C   30 mL at 04/19/16 0901  . FLUoxetine (PROZAC) capsule 10 mg  10 mg Oral Daily Ankit Lorie Phenix, MD   10 mg at 04/19/16 0901  . furosemide (LASIX) tablet 80 mg  80 mg Oral Daily HOMAS FRANSSEN, PA-C   80 mg at 04/19/16 0901  . guaiFENesin-dextromethorphan (ROBITUSSIN DM) 100-10 MG/5ML syrup 5-10 mL  5-10 mL Oral Q6H PRN Ivan Anchors Grattan, PA-C      . hydrocerin (EUCERIN) cream   Topical BID Ivan Anchors [place]den, PA-C      . hydrocortisone (ANUSOL-HC) 2.5 % rectal cream   Rectal BID PRN Bary Leriche, PA-C      . Influenza vac split quadrivalent PF (FLUARIX) injection 0.5 mL  0.5 mL Intramuscular Tomorrow-1000 Milus Mallick, RN      . insulin aspart (novoLOG) injection 0-9 Units  0-9 Units Subcutaneous TID WC Bary Leriche, PA-C   2 Units at 04/19/16 1329  . insulin aspart (novoLOG) injection 5 Units  5 Units Subcutaneous TID WC Ankit Lorie Phenix, MD   5 Units at 04/19/16 1328  . insulin aspart protamine- aspart (NOVOLOG MIX 70/30) injection 30 Units  30 Units  Subcutaneous Q supper VEDDER DICKERT, PA-C   30 Units at 04/18/16 1802  . insulin aspart protamine- aspart (NOVOLOG MIX 70/30) injection 35 Units  35 Units Subcutaneous Q breakfast Ankit Lorie Phenix, MD   35 Units at 04/19/16 [phone]  . isosorbide-hydrALAZINE (BIDIL) 20-37.5 MG per tablet 2 tablet  2 tablet Oral TID Bary Leriche, PA-C   2 tablet at  04/19/16 0905  . lidocaine (LIDODERM) 5 % 1 patch  1 patch Transdermal Q24H Ankit Lorie Phenix, MD   1 patch at 04/19/16 1038  . lidocaine (XYLOCAINE) 2 % jelly   Topical PRN Bary Leriche, PA-C      . losartan (COZAAR) tablet 100 mg  100 mg Oral Daily ALICK FOLKS, PA-C   100 mg at 04/19/16 [id number]  . methylphenidate (RITALIN) tablet 5 mg  5 mg Oral BID WC Ankit Lorie Phenix, MD   5 mg at 04/19/16 0901  . mirtazapine (REMERON) tablet 7.5 mg  7.5 mg Oral QHS Ivan Anchors Gheen, PA-C   7.5 mg at 04/18/16 2151  . nystatin (MYCOSTATIN) 100000 UNIT/ML suspension 500,000 Units  5 mL Oral QID Bary Leriche, PA-C   500,000 Units at 04/19/16 1330  . pneumococcal 23 valent vaccine (PNU-IMMUNE) injection 0.5 mL  0.5 mL Intramuscular Tomorrow-1000 Milus Mallick, RN      . polyethylene glycol (MIRALAX / GLYCOLAX) packet 17 g  17 g Oral Daily PRN SUNAY CHOCK, PA-C   17 g at 04/17/16 2139  . potassium chloride SA (K-DUR,KLOR-CON) CR tablet 40 mEq  40 mEq Oral BID Meredith Staggers, MD   40 mEq at 04/19/16 0902  . prochlorperazine (COMPAZINE) tablet 5-10 mg  5-10 mg Oral Q6H PRN Bary Leriche, PA-C       Or  . prochlorperazine (COMPAZINE) injection 5-10 mg  5-10 mg Intramuscular Q6H PRN Bary Leriche, PA-C       Or  . prochlorperazine (COMPAZINE) suppository 12.5 mg  12.5 mg Rectal Q6H PRN Bary Leriche, PA-C      . rivaroxaban (XARELTO) tablet 20 mg  20 mg Oral Q supper Pamela S Lajara, PA-C   20 mg at 04/18/16 1700  . senna-docusate (Senokot-S) tablet 1 tablet  1 tablet Oral Daily KUMAR MESLER, PA-C   1 tablet at 04/19/16 [id number]  . sodium chloride 0.9 % 1,000 mL infusion    Intravenous Daily Pamela S Hamman, PA-C      . sodium phosphate (FLEET) 7-19 GM/118ML enema 1 enema  1 enema Rectal Once PRN Pamela S Ensz, PA-C      . traZODone (DESYREL) tablet 25-50 mg  25-50 mg Oral QHS PRN Bary Leriche, PA-C         Discharge Medications: Please see discharge summary for a list of discharge medications.  Relevant Imaging Results:  Relevant Lab Results:   Additional Information SSN: [id number]  Keyanna Sandefer, Gardiner Rhyme, LCSW

## 2016-04-19 NOTE — Patient Care Conference (Signed)
Inpatient RehabilitationTeam Conference and Plan of Care Update Date: 04/18/2016   Time: 11:15 AM    Patient Name: [name]      Medical Record Number: [id number]  Date of Birth: [birth year] Sex: Male         Room/Bed: 4M03C/4M03C-01 Payor Info: Payor: MEDICARE / Plan: MEDICARE PART A AND B / Product Type: *No Product type* /    Admitting Diagnosis: CVA  Admit Date/Time:  04/03/2016  4:08 PM Admission Comments: No comment available   Primary Diagnosis:  <principal problem not specified> Principal Problem: <principal problem not specified>  Patient Active Problem List   Diagnosis Date Noted  . Cognitive and behavioral changes   . Cerebrovascular accident (CVA) due to embolism of left posterior cerebral artery ([place])   . Lethargy   . AKI (acute kidney injury) ([place])   . Low back pain   . Leukocytosis   . Labile blood glucose   . Hypoglycemia due to insulin   . Acute lower UTI   . Deep tissue injury   . Renovascular hypertension   . History of DVT (deep vein thrombosis)   . Coronary artery disease involving native coronary artery of native heart without angina pectoris   . Morbid obesity ([place])   . Stage 2 chronic kidney disease   . Hypokalemia   . Acute blood loss anemia   . Embolic stroke involving posterior cerebral artery ([place]) 04/03/2016  . Brainstem stroke ([place]) 04/01/2016  . Abnormality of gait and mobility   . Cerebral infarction due to thrombosis of other cerebral artery ([place])   . Right arm numbness   . Left sided numbness 03/29/2016  . TIA (transient ischemic attack) 03/29/2016  . Hypertensive urgency 03/29/2016  . Sepsis ([place]) 09/10/2013  . DVT (deep venous thrombosis) ([place]) 01/01/2013  . Rash and nonspecific skin eruption 12/29/2012  . Cellulitis and abscess of leg 12/29/2012  . Diabetes type 2, uncontrolled ([place]) 12/29/2012  . Hypertension   . Cardiomyopathy ([place])   . CHF (congestive heart failure) ([place])   . Peripheral neuropathy ([place])   . Special screening  for malignant neoplasms, colon 12/09/2012    Expected Discharge Date:    Team Members Present: Physician leading conference: Dr. Delice Lesch Social Worker Present: Ovidio Kin, LCSW Nurse Present: Heather Roberts, RN PT Present: Carney Living, PT OT Present: Cherylynn Ridges, OT SLP Present: Other (comment) (happi Overton-SP) PPS Coordinator present : Daiva Nakayama, RN, CRRN     Current Status/Progress Goal Weekly Team Focus  Medical   Right hemiparesis and functional deficits secondary to left paramedian pontine infarct on 1/31  Improve mobility, initation, cognition, HTN, DM  See above   Bowel/Bladder   Incontinent of bowel and bladder. LBM 04/14/16. Miralax given but not effective. I&O cath  patient to be able to urinate on his own  Timed toileting q 2 hrs.   Swallow/Nutrition/ Hydration   Min A  Min A  addressed not using straws to eliminate cough with thin liquids, no overt s/s of aspiration via cup   ADL's   Max A overall, sligtly higher arousal   Min A overall  R side attention, R NMR, midline orientation, improved sit<>stand, functional use of R UE   Mobility   max A bed mobility and transfers, +2 short distance gait for w/c follow  min A overall  functional mobility training, R NMR, coordination, postural control, midline orientation, balance, activity tolerance, arousal, participation, attention   Communication   Max A for any level  of verbal response  Mod A for phrase level  implemented multimodal activites and verbal choices to increase response   Safety/Cognition/ Behavioral Observations  Max A  Mod A  focused on sustained attention for 1 minute, following simple 1 step directions   Pain   No complain of pain  <2  Assess and treat pain q shift and as needed   Skin   Secrum is red covered with foam, DTI both hels, prevalon boots all times  Skin to be free futher breakdown/infection  Assess and treat any skin issues q shift and as needed.      *See Care Plan and progress  notes for long and short-term goals.  Barriers to Discharge: Cognition, safety, HTN, DM, CKD, mobility    Possible Resolutions to Barriers:  Therapies, optimize HTN, DM meds, follow labs    Discharge Planning/Teaching Needs:  Daughter here Tuesday to observe pt in therapies, to try to see if he woulod participate more. Will begin looking for NH bed.      Team Discussion:  Participating in therapies more so this week. Feeling better after treatment for UTI and air bed now for flank pain. Adjusting diabetes and BP meds. I & O cath now. Daughter here this week to assist with po intake and encourage to participate. Neuro-psych seeing for support. Begin the NH bed search  Revisions to Treatment Plan:  NHP   Continued Need for Acute Rehabilitation Level of Care: The patient requires daily medical management by a physician with specialized training in physical medicine and rehabilitation for the following conditions: Daily direction of a multidisciplinary physical rehabilitation program to ensure safe treatment while eliciting the highest outcome that is of practical value to the patient.: Yes Daily medical management of patient stability for increased activity during participation in an intensive rehabilitation regime.: Yes Daily analysis of laboratory values and/or radiology reports with any subsequent need for medication adjustment of medical intervention for : Neurological problems;Diabetes problems;Blood pressure problems;Renal problems;Other  Elease Hashimoto 04/19/2016, 9:41 AM

## 2016-04-19 NOTE — Progress Notes (Signed)
Physical Therapy Session Note  Patient Details  Name: [name] MRN: [id number] Date of Birth: 08/25/1955  Today's Date: 04/18/2016     Short Term Goals: Week 2:  PT Short Term Goal 1 (Week 2): Pt will perform bed mobility with mod A using hospital bed functions. PT Short Term Goal 2 (Week 2): Patient will transfer sit <> stand with mod A 50% of available opportunities.  PT Short Term Goal 3 (Week 2): Pt will transfer bed <> wheelchair with mod A 25% of available opportunities.     Skilled Therapeutic Interventions/Progress Updates:   Pt received supine in bed and not agreeable to PT. PT attempted to encourage patient out of bed multiple times, but pt reports that he has to stay in bed for the rest of the night. PT will re-attempt to see patient at later time.      Therapy Documentation Precautions:  Precautions Precautions: Fall Restrictions Weight Bearing Restrictions: No General:   missed time: 30 min ( pt fatigue/refusal)  Vital Signs: Therapy Vitals Temp: 98.2 F (36.8 C) Temp Source: Oral Pulse Rate: 64 Resp: 18 BP: (!) 167/64 Patient Position (if appropriate): Lying Oxygen Therapy SpO2: 94 % O2 Device: Not Delivered   See Function Navigator for Current Functional Status.   Therapy/Group: Individual Therapy  Lorie Phenix 04/19/2016, 6:00 AM

## 2016-04-19 NOTE — Progress Notes (Signed)
Physical Therapy Session Note  Patient Details  Name: [name] MRN: [id number] Date of Birth: 01-May-1955  Today's Date: 04/19/2016 PT Individual Time: 1515-1600 PT Individual Time Calculation (min): 45 min   Short Term Goals: Week 2:  PT Short Term Goal 1 (Week 2): Pt will perform bed mobility with mod A using hospital bed functions. PT Short Term Goal 2 (Week 2): Patient will transfer sit <> stand with mod A 50% of available opportunities.  PT Short Term Goal 3 (Week 2): Pt will transfer bed <> wheelchair with mod A 25% of available opportunities.   Skilled Therapeutic Interventions/Progress Updates:   Patient asleep upon arrival, easily aroused and eventually agreeable to participate with encouragement. Patient transferred to EOB with HOB raised using rail with max A and max verbal/tactile cues for sequencing. Performed squat pivot transfers to L with max A to wheelchair and to NuStep and to R with +2A due to difficulty sequencing and coordinating transfer from NuStep. Negotiated up/down one 6" step using 2 rails with +2A and max multimodal cues for sequencing and safety due to increased RLE adductor tone in standing with total A for RLE placement. Gait in hallway using L rail with max A and +2 w/c follow, max cues for advancing LUE along rail and sequencing gait pattern. Performed NuStep using BLE only with RLE attachment to maintain neutral alignment due to R hip ER at level 1 > 3 for RLE neuro re-ed, coordination, and reciprocal pattern retraining with multimodal cues for upright posture and midline orientation due to posterior and lateral lean to R with patient requiring progressively increased cues to attention to task and sequencing. Patient left in TIS wheelchair with quick release belt donned and needs in reach.   Therapy Documentation Precautions:  Precautions Precautions: Fall Restrictions Weight Bearing Restrictions: No Pain: Pain Assessment Pain Assessment: No/denies  pain Pain Score: 0-No pain Faces Pain Scale: No hurt  See Function Navigator for Current Functional Status.   Therapy/Group: Individual Therapy  Hezakiah Champeau, Murray Hodgkins 04/19/2016, 4:22 PM

## 2016-04-19 NOTE — Progress Notes (Signed)
Physical Therapy Session Note  Patient Details  Name: [name] MRN: [id number] Date of Birth: [birth year]  Today's Date: 04/19/2016 PT Individual Time: 1125-1200   PT Individual Time Calculation (min): 35 min   Short Term Goals: Week 2:  PT Short Term Goal 1 (Week 2): Pt will perform bed mobility with mod A using hospital bed functions. PT Short Term Goal 2 (Week 2): Patient will transfer sit <> stand with mod A 50% of available opportunities.  PT Short Term Goal 3 (Week 2): Pt will transfer bed <> wheelchair with mod A 25% of available opportunities.   Skilled Therapeutic Interventions/Progress Updates:   Treatment 1: Patient in chair upon arrival with minimal verbalizations this session and keeping eyes closed 75% of time. Patient requested water, provided thin liquids per swallowing precautions and patient with repeated violent coughing after each sip. RN and SLP made aware and SLP trialled nectar thick liquid with no coughing observed and updated orders until able to further assess swallowing. Performed squat pivot transfer from wheelchair to mat table with max A on second attempt as patient initially stood from wheelchair but was unable to pivot to mat table and returned to seated position. Seated EOM, performed reaching task and horseshoe toss to target with LUE with focus on maintaining midline orientation with R hip wedged. Patient with progressive anterior and lateral lean to R with minimal ability to correct with max verbal/tactile cues. Patient transferred halfway from wheelchair to bed via squat pivot with max A and then became unresponsive to therapist's commands or requests to continue scooting onto bed, requiring assist of second person to transfer completely to bed. Transferred sit > supine with max A and patient assisted with repositioning in bed with bed in trendelenberg position. Patient left supine in bed with RN and nurse tech in room.   Therapy Documentation Precautions:   Precautions Precautions: Fall Restrictions Weight Bearing Restrictions: No Pain: Pain Assessment Pain Assessment: Faces Faces Pain Scale: No hurt   See Function Navigator for Current Functional Status.   Therapy/Group: Individual Therapy  [name], Murray Hodgkins 04/19/2016, 12:33 PM

## 2016-04-19 NOTE — Progress Notes (Signed)
[facility]     PROGRESS NOTE  Subjective/Complaints:  Pt seen in steady this AM, he has right lean and is difficult to correct, despite max cues.  He states he slept well overnight  ROS: Denies CP, SOB, N/V/D.Marland Kitchen  Objective: Vital Signs: Blood pressure (!) 167/64, pulse 64, temperature 98.2 F (36.8 C), temperature source Oral, resp. rate 18, height 5\' 9"  (1.753 m), weight 110.2 kg (243 lb), SpO2 94 %. No results found.  Recent Labs  04/18/16 0441  WBC 8.4  HGB 12.6*  HCT 40.0  PLT 344    Recent Labs  04/18/16 0441  NA 145  K 3.8  CL 105  GLUCOSE 196*  BUN 36*  CREATININE 1.43*  CALCIUM 9.8   CBG (last 3)   Recent Labs  04/18/16 1656 04/18/16 2048 04/19/16 0643  GLUCAP 259* 212* 172*    Wt Readings from Last 3 Encounters:  04/19/16 110.2 kg (243 lb)  03/28/16 111.1 kg (245 lb)  09/04/13 120.2 kg (265 lb)    Physical Exam:  BP (!) 167/64 (BP Location: Right Arm)   Pulse 64   Temp 98.2 F (36.8 C) (Oral)   Resp 18   Ht 5\' 9"  (1.753 m)   Wt 110.2 kg (243 lb)   SpO2 94%   BMI 35.88 kg/m  Constitutional: He appears well-developed. Obese. HENT: Normocephalic and atraumatic.  Eyes: EOM are normal. No discharge.  Cardiovascular: RRR. No JVD. Respiratory: Effort normal and breath sounds normal.  GI: Soft. Bowel sounds are normal.  Musculoskeletal: He exhibits no edema. He exhibits no tenderness.  Neurological: Keeps eyes closed. Dysarthric  Limited vision.  Poor insight and awareness.    Motor: 4+/5 RUE prox to distal. Ataxia (stable).  RLE: 4/+/5 proximal to distal  LLE 4+ /5 proximal to distal.  LUE/LLE proximal to distal 5/5.  Skin: Warm and dry. B/l DTIs.  Psychiatric: Flat affect. Limited initiation.   Assessment/Plan: 1. Functional deficits secondary to left paramedian pontine infarct which require 3+ hours per day of interdisciplinary therapy in a comprehensive inpatient rehab setting. Physiatrist is  providing close team supervision and 24 hour management of active medical problems listed below. Physiatrist and rehab team continue to assess barriers to discharge/monitor patient progress toward functional and medical goals.  Function:  Bathing Bathing position   Position: Wheelchair/chair at sink  Bathing parts Body parts bathed by patient: Right arm, Left arm, Chest, Abdomen, Left upper leg, Right upper leg Body parts bathed by helper: Left lower leg, Right lower leg, Buttocks, Front perineal area  Bathing assist Assist Level: Touching or steadying assistance(Pt > 75%)      Upper Body Dressing/Undressing Upper body dressing   What is the patient wearing?: Pull over shirt/dress     Pull over shirt/dress - Perfomed by patient: Pull shirt over trunk, Put head through opening Pull over shirt/dress - Perfomed by helper: Thread/unthread right sleeve, Thread/unthread left sleeve Button up shirt - Perfomed by patient: Thread/unthread left sleeve, Thread/unthread right sleeve Button up shirt - Perfomed by helper: Pull shirt around back, Button/unbutton shirt    Upper body assist Assist Level: Touching or steadying assistance(Pt > 75%)      Lower Body Dressing/Undressing Lower body dressing   What is the patient wearing?: Pants       Pants- Performed by helper: Thread/unthread right pants leg, Thread/unthread left pants leg, Pull pants up/down   Non-skid slipper socks- Performed by helper: Don/doff left sock, Don/doff right sock  Shoes - Performed by helper: Don/doff right shoe, Don/doff left shoe, Fasten right, Fasten left   AFO - Performed by helper: Don/doff right AFO, Don/doff left AFO (ACE wraps)   TED Hose - Performed by helper: Don/doff right TED hose, Don/doff left TED hose  Lower body assist Assist for lower body dressing: Touching or steadying assistance (Pt > 75%)      Toileting Toileting Toileting activity did not occur: No continent bowel/bladder event    Toileting steps completed by helper: Adjust clothing prior to toileting, Performs perineal hygiene, Adjust clothing after toileting Toileting Assistive Devices: Other (comment) (stedy)  Toileting assist Assist level: Touching or steadying assistance (Pt.75%)   Transfers Chair/bed transfer   Chair/bed transfer method: Squat pivot Chair/bed transfer assist level: Maximal assist (Pt 25 - 49%/lift and lower) Chair/bed transfer assistive device: Armrests Mechanical lift: Stedy   Locomotion Ambulation     Max distance: 15 ft Assist level: 2 helpers   Wheelchair   Type: Manual Max wheelchair distance: 50 ft Assist Level: Dependent (Pt equals 0%)  Cognition Comprehension Comprehension assist level: Understands basic 50 - 74% of the time/ requires cueing 25 - 49% of the time  Expression Expression assist level: Expresses basic 25 - 49% of the time/requires cueing 50 - 75% of the time. Uses single words/gestures.  Social Interaction Social Interaction assist level: Interacts appropriately 25 - 49% of time - Needs frequent redirection.  Problem Solving Problem solving assist level: Solves basic 25 - 49% of the time - needs direction more than half the time to initiate, plan or complete simple activities  Memory Memory assist level: Recognizes or recalls 25 - 49% of the time/requires cueing 50 - 75% of the time    Medical Problem List and Plan: 1.  Right hemiparesis and functional deficits secondary to left paramedian pontine infarct on 1/31  Cont CIR  Baclofen started for spasms d/ced on 2/14 due to lethargy with some improvement  Will order CT today due to waxing/waning symptoms 2. H/o DVT /Anticoagulation: Pharmaceutical: Xarelto 3. Pain Management:   Lidoderm patch started due to therapy reports of pain - patient states improved, however, ?reliability.  4. Mood: LCSW to follow for evaluation and support.   Fluoxetine started 2/15  Trial amantadine on 2/19, with ?improvement  Trial  Ritalin started 2/22 5. Neuropsych: This patient is not yet capable of making decisions on his own behalf.  Appreciate Neuropsych eval 6. Skin/Wound Care: Routine pressure relief measures. Eucerin to b/l LE.  Prevalon boots ordered for DTI 7. Fluids/Electrolytes/Nutrition: Monitor I/Os  Intake has been variable affecting CBGs  Remeron added 2/15, with improvement 8. HTN: Monitor BP.   Continue coreg, cozaar, Bidil and lasix.     Chlorthalidone increased on 2/8, again on 2/12, increased again on 2/15  Norvasc 5 started 2/22 9. T2DM with retinopathy and neuropathy: Blood sugars poorly controlled with A1C- 11.0. Consult RD to educated on [place]. Monitor BS ac/hs.   Novolog 70/30 30 AM, increased to 35U on 2/22  Novolog 70/30 30 PM  Novalog decreased to 2U TID, increased to 5U on 2/21  qHS snack added 2/14  Remains extremely labile 10. CAD/ Acute systolic CHF/NICM: compensated. Check daily weights and monitor for signs of overload. Heart healthy/low salt diet. Will need perfusion study on outpatient basis per cardiology. Cont meds Filed Weights   04/18/16 0655 04/18/16 0737 04/19/16 0500  Weight: 112.5 kg (248 lb) 110.7 kg (244 lb) 110.2 kg (243 lb)   Clinically stable  ?  Trending up 11. Morbid Obesity: BMI - 36. Encourage appropriate diet, weight loss and exercise.  12. CKD:   Cr. 1.43 on 2/21 (around baseline)  Encourage fluids  IVF qhs at 50cc/hr, d/ced 2/20 13. Hypokalemia:   Increased K+ supplement 2/9  K+ 3.8 on 2/21  Continue supplement 14. ABLA  Hb 12.6 on 2/21  Cont to monitor 15. Acute lower UTI  UA+, Ucx with Pseudomonas and enterococcus  Spoke with pharmacy, macrobid changed to Cipro, completed 2/12-2/18  LOS (Days) 16 A FACE TO FACE EVALUATION WAS PERFORMED  Zahir Eisenhour Lorie Phenix 04/19/2016 8:17 AM

## 2016-04-19 NOTE — Progress Notes (Signed)
Physical Therapy Note  Patient Details  Name: ARBEN KARAGIANNIS MRN: [id number] Date of Birth: 06-Jul-1955 Today's Date: 04/19/2016  Patient asleep upon arrival, easily awakened but refused therapy multiple times stating he was too tired. Patient also currently awaiting stat CT. Will f/u as able.    Reyan Helle, Murray Hodgkins 04/19/2016, 1:12 PM

## 2016-04-19 NOTE — Progress Notes (Signed)
Occupational Therapy Session Note  Patient Details  Name: [name] MRN: [id number] Date of Birth: 11/30/1955  Today's Date: 04/19/2016 OT Individual Time: 0902-1000 OT Individual Time Calculation (min): 58 min   Short Term Goals: Week 3:  OT Short Term Goal 1 (Week 3): Pt will utilize R UE during functional activity 25% of the time with min verbal cues OT Short Term Goal 2 (Week 3): Pt will maintain midline posture during grooming task at the sink for 2 mins with mod verbal cues OT Short Term Goal 3 (Week 3): Pt will don shirt using hemi-techniques with Mod A of 1  Skilled Therapeutic Interventions/Progress Updates:    Pt greeted in wc as RN finished administering meds. ADL session focused on improved sit<>stand, standing balance, midline orientation, functional use of R UE and R side attention. Pt continued to have low arousal today requiring constant multimodal cues to open eyes and maintain attention to task at hand.  Objects placed on R side of body to encourage attention to R and functional use of R hand. Addressed B UE coordination, and in-hand manipulation with opening and closing twist top containers-multiple trials and hand-over-hand A to place deodorant lid from R hand onto deodorant bottle in L hand. Max A sit<>stand at sink + R knee block and max A to maintain dynamic standing balance to attempt to wash buttocks. +2 assistance with dynamic standing activities for set-up and safety. Max multimodal cues and manual facilitation to achieve midline orientation in siting and standing. Max A for UB dressing today, unable to coordinate threading UE's through shirt and verbal cues to sequence task. Pt left seated in TIS wc with safety belt on and call bell in reach.   Therapy Documentation Precautions:  Precautions Precautions: Fall Restrictions Weight Bearing Restrictions: No Pain:  denies pain ADL: ADL ADL Comments: Please see functional navigator  See Function Navigator for  Current Functional Status.   Therapy/Group: Individual Therapy  Valma Cava 04/19/2016, 9:42 AM

## 2016-04-19 NOTE — Plan of Care (Signed)
Problem: RH Balance Goal: LTG Patient will maintain dynamic standing with ADLs (OT) LTG:  Patient will maintain dynamic standing balance with assist during activities of daily living (OT)   Goal downgraded 2/22- ESD  Problem: RH Eating Goal: LTG Patient will perform eating w/assist, cues/equip (OT) LTG: Patient will perform eating with assist, with/without cues using equipment (OT)  Goal downgraded 2/22- ESD  Problem: RH Grooming Goal: LTG Patient will perform grooming w/assist,cues/equip (OT) LTG: Patient will perform grooming with assist, with/without cues using equipment (OT)  Goal downgraded 2/22- ESD  Problem: RH Bathing Goal: LTG Patient will bathe with assist, cues/equipment (OT) LTG: Patient will bathe specified number of body parts with assist with/without cues using equipment (position)  (OT)  Goal downgraded 2/22- ESD  Problem: RH Dressing Goal: LTG Patient will perform lower body dressing w/assist (OT) LTG: Patient will perform lower body dressing with assist, with/without cues in positioning using equipment (OT)  Goal downgraded 2/22- ESD  Problem: RH Toileting Goal: LTG Patient will perform toileting w/assist, cues/equip (OT) LTG: Patient will perform toiletiing (clothes management/hygiene) with assist, with/without cues using equipment (OT)  Goal downgraded 2/22- ESD  Problem: RH Toilet Transfers Goal: LTG Patient will perform toilet transfers w/assist (OT) LTG: Patient will perform toilet transfers with assist, with/without cues using equipment (OT)  Goal downgraded 2/22- ESD  Problem: RH Tub/Shower Transfers Goal: LTG Patient will perform tub/shower transfers w/assist (OT) LTG: Patient will perform tub/shower transfers with assist, with/without cues using equipment (OT)  Goal downgraded 2/22- ESD  Comments: Goal downgraded 2/22- ESD

## 2016-04-19 NOTE — Progress Notes (Addendum)
Speech Language Pathology Daily Session Note  Patient Details  Name: [name] MRN: [id number] Date of Birth: 1955/09/06  Today's Date: 04/19/2016   Session #1 SLP Individual Time: 0830-0900 SLP Individual Time Calculation (min): 30 min   Session #2 SLP Individual Time: 1130-1140 SLP Individual Time Calculation (min): 10 min  Short Term Goals: Week 3: SLP Short Term Goal 1 (Week 3): Pt will demonstrate sustained attention to basic, familiar tasks for 5 minutes with Max A cues for redirection.  SLP Short Term Goal 2 (Week 3): Given choice of orientation information, pt will select correct answer with Max A multimodal cues.  SLP Short Term Goal 3 (Week 3): Pt will utilize speech intelligibility strategies with Max A verbal cues to achieve ~>75% intelligibility at the phrase level.  SLP Short Term Goal 4 (Week 3): Given Mod A multimodal cues, pt will complete functional 1 step basic, familiar directions.  SLP Short Term Goal 5 (Week 3): Pt will consume least restrictive diet without overt s/s of aspiration with Mod A cues for use of compensatory swallow strategies.   Skilled Therapeutic Interventions:   Treatment Session #1 - Skilled treatment session focused on dysphagia and cognition goals. SLP facilitated session by providing Max a multimodal cues for increased attention to bolus in mouth. Pt with increased perseverative chewing and inability to clear bolus of regular textures this session. Pt required Max A multimodal cues for attention to cup in hand and for liquid wash. Pt unable to successfully clear bolus. Boluses of bread were more difficult then meat texutres and were removed by SLP. Recommend downgrading diet to dysphagia 2 with full nursing supervision to increase attention to bolus. Education provided to nursing. Pt was left upright in wheelchair, safety belt donned and all needs within reach. Continue per current plan of care.   Treatment Session #2 - SLP in room next to pt's  later in day, pt heard aggressively coughing with PT and nursing present. Pt with choking on sips of thin liquids. SLP administered trials of necta thick by cup given severity of coughing and appearance of aspiration with thin liquids. Pt consumed nectar thick by cup without overt s/s of aspiration. Downgraded to nectar thick liquids. Education provided to PA who ordered stat CT d/t pt decline. Pt left upright in wheelchair with PT. Continue perc Current plan of care.      Function:  Eating Eating   Modified Consistency Diet: No Eating Assist Level: More than reasonable amount of time;Set up assist for;Supervision or verbal cues;Helper scoops food on utensil;Helper brings food to mouth;Help managing cup/glass;Hand over hand assist;Help with picking up utensils;Helper checks for pocketed food;Helper feeds patient   Eating Set Up Assist For: Opening containers;Cutting food Helper Plain on Utensil: Occasionally Helper Brings Food to Mouth: Occasionally   Cognition Comprehension Comprehension assist level: Understands basic 50 - 74% of the time/ requires cueing 25 - 49% of the time  Expression   Expression assist level: Expresses basic 25 - 49% of the time/requires cueing 50 - 75% of the time. Uses single words/gestures.  Social Interaction Social Interaction assist level: Interacts appropriately 25 - 49% of time - Needs frequent redirection.  Problem Solving Problem solving assist level: Solves basic 25 - 49% of the time - needs direction more than half the time to initiate, plan or complete simple activities  Memory Memory assist level: Recognizes or recalls 25 - 49% of the time/requires cueing 50 - 75% of the time    Pain  Therapy/Group: Individual Therapy  Tanzie Rothschild 04/19/2016, 11:12 AM

## 2016-04-19 NOTE — Progress Notes (Signed)
Social Work Patient ID: [name], male   DOB: Aug 07, 1955, 61 y.o.   MRN: [id number]  Spoke with Nicole-daughter to inform team conference and his progress for the week. Made aware is eating better and Participating in therapies also with other daughter-Brandi here. Made aware will begin the NH process and start looking for a bed for pt to go to. All in agreement with this plan. They prefer [facility], will begin there.

## 2016-04-20 ENCOUNTER — Inpatient Hospital Stay (HOSPITAL_COMMUNITY): Payer: Medicare Other | Admitting: Occupational Therapy

## 2016-04-20 ENCOUNTER — Inpatient Hospital Stay (HOSPITAL_COMMUNITY): Payer: Medicare Other | Admitting: Physical Therapy

## 2016-04-20 ENCOUNTER — Inpatient Hospital Stay (HOSPITAL_COMMUNITY): Payer: Medicare Other | Admitting: Speech Pathology

## 2016-04-20 ENCOUNTER — Inpatient Hospital Stay (HOSPITAL_COMMUNITY): Payer: Medicare Other

## 2016-04-20 LAB — URINALYSIS, ROUTINE W REFLEX MICROSCOPIC
Bilirubin Urine: NEGATIVE
GLUCOSE, UA: NEGATIVE mg/dL
HGB URINE DIPSTICK: NEGATIVE
Ketones, ur: NEGATIVE mg/dL
LEUKOCYTES UA: NEGATIVE
Nitrite: NEGATIVE
Protein, ur: NEGATIVE mg/dL
SPECIFIC GRAVITY, URINE: 1.011 (ref 1.005–1.030)
pH: 5 (ref 5.0–8.0)

## 2016-04-20 LAB — GLUCOSE, CAPILLARY
GLUCOSE-CAPILLARY: 224 mg/dL — AB (ref 65–99)
Glucose-Capillary: 161 mg/dL — ABNORMAL HIGH (ref 65–99)
Glucose-Capillary: 333 mg/dL — ABNORMAL HIGH (ref 65–99)
Glucose-Capillary: 222 mg/dL — ABNORMAL HIGH (ref 65–99)

## 2016-04-20 MED ORDER — RESOURCE THICKENUP CLEAR PO POWD
ORAL | Status: DC | PRN
  Administered 2016-04-20: 11:00:00 via ORAL
  Filled 2016-04-20: qty 125

## 2016-04-20 NOTE — Progress Notes (Signed)
Speech Language Pathology Daily Session Note  Patient Details  Name: [name] MRN: [id number] Date of Birth: 05/11/1955  Today's Date: 04/20/2016 SLP Individual Time: 0830-0900 SLP Individual Time Calculation (min): 30 min  Short Term Goals: Week 3: SLP Short Term Goal 1 (Week 3): Pt will demonstrate sustained attention to basic, familiar tasks for 5 minutes with Max A cues for redirection.  SLP Short Term Goal 2 (Week 3): Given choice of orientation information, pt will select correct answer with Max A multimodal cues.  SLP Short Term Goal 3 (Week 3): Pt will utilize speech intelligibility strategies with Max A verbal cues to achieve ~>75% intelligibility at the phrase level.  SLP Short Term Goal 4 (Week 3): Given Mod A multimodal cues, pt will complete functional 1 step basic, familiar directions.  SLP Short Term Goal 5 (Week 3): Pt will consume least restrictive diet without overt s/s of aspiration with Mod A cues for use of compensatory swallow strategies.   Skilled Therapeutic Interventions: Skilled treatment session focused on dysphagia and cognition goals. SLP facilitated session by providing trials of thin liquids. Pt without overt s/s of aspiration with spoon and single cup sips. However with successive cups sips, pt's vocal quality changed and delayed cough present. Pt with increased confusion this session as he states that he is at the dentist office. Max A verbal cues needed to reorientation pt. Pt also required Max A multimodal cues for sustained attention for ~1 minute intervals. Pt's daughter present during session, recommend pt remaining on nectar thick liquids. Education provided to daughter, pt left upright in bed with all needs within reach. Continue per current plan of care.      Function:    Cognition Comprehension Comprehension assist level: Understands basic 50 - 74% of the time/ requires cueing 25 - 49% of the time  Expression   Expression assist level: Expresses  basic 25 - 49% of the time/requires cueing 50 - 75% of the time. Uses single words/gestures.  Social Interaction Social Interaction assist level: Interacts appropriately 25 - 49% of time - Needs frequent redirection.  Problem Solving Problem solving assist level: Solves basic 25 - 49% of the time - needs direction more than half the time to initiate, plan or complete simple activities  Memory Memory assist level: Recognizes or recalls 25 - 49% of the time/requires cueing 50 - 75% of the time    Pain    Therapy/Group: Individual Therapy  Katherina Wimer 04/20/2016, 12:16 PM

## 2016-04-20 NOTE — Progress Notes (Signed)
Physical Therapy Weekly Progress Note  Patient Details  Name: [name] MRN: [id number] Date of Birth: 02-05-1956  Beginning of progress report period: April 12, 2016 End of progress report period: April 20, 2016  Today's Date: 04/20/2016 PT Individual Time: 1330-1430 PT Individual Time Calculation (min): 60 min   Patient has met 0 of 3 short term goals. Patient making limited and inconsistent progress toward functional goals due to decreased arousal and difficulty maintaining eyes open, however his posterior buttock/LE pain appears to be improved and participation has improved since last reporting period. Patient's daughter Velna Hatchet has been present to observe therapies and encourage patient, however family unable to provide 24/7 assistance at DC and patient will benefit from SNF for further therapies.    Patient continues to demonstrate the following deficits muscle weakness and muscle joint tightness, decreased cardiorespiratoy endurance, impaired timing and sequencing, abnormal tone, unbalanced muscle activation, ataxia, decreased coordination and decreased motor planning, decreased visual acuity, decreased midline orientation and decreased attention to right, decreased initiation, decreased attention, decreased awareness, decreased problem solving, decreased safety awareness, decreased memory and delayed processing and decreased sitting balance, decreased standing balance, decreased postural control and decreased balance strategies and therefore will continue to benefit from skilled PT intervention to increase functional independence with mobility.  Patient not progressing toward long term goals.  See goal revision.  Plan of care revisions: Downgraded to mod A overall and DC'd car transfer goal due to planned DC to SNF.Marland Kitchen  PT Short Term Goals Week 2:  PT Short Term Goal 1 (Week 2): Pt will perform bed mobility with mod A using hospital bed functions. PT Short Term Goal 1 - Progress  (Week 2): Not met PT Short Term Goal 2 (Week 2): Patient will transfer sit <> stand with mod A 50% of available opportunities.  PT Short Term Goal 2 - Progress (Week 2): Not met PT Short Term Goal 3 (Week 2): Pt will transfer bed <> wheelchair with mod A 25% of available opportunities.  PT Short Term Goal 3 - Progress (Week 2): Not met Week 3:  PT Short Term Goal 1 (Week 3): = LTGs due to anticipated LOS  Skilled Therapeutic Interventions/Progress Updates:   Patient in bed upon arrival, easily aroused and agreeable to participate. Transferred to EOB with HOB raised and rail with mod A to bring BLE off bed and max A to achieve upright sitting posture and cues for sequencing. Performed squat pivot transfers to R and L from bed > TIS wheelchair <> level mat table with max A of 1 person. Static sitting balance EOM with variable assist level supervision-mod A due to consistent posterior lean with min-mod ability to recorrect to midline with max verbal/tactile/visual cues. Instructed in sit <> stands x 5 from EOM using hemiwalker with mirror for visual feedback, patient able to maintain static standing balance briefly (up to 5 sec) with close supervision-steady assist before requiring max A to return to sitting due to pushing to R. Gait using L rail in hallway x 10 ft progressed to gait using hemiwalker x 20 ft with mod-max A from PT for weight shifting and intermittent advancement/placement RLE with another person managing LUE grip on HW and advancement of HW, third person for wheelchair follow. Patient left sitting in TIS wheelchair with quick release belt in place and needs in reach.   Therapy Documentation Precautions:  Precautions Precautions: Fall Restrictions Weight Bearing Restrictions: No Pain: Pain Assessment Pain Assessment: No/denies pain Pain Score: 0-No pain  See Function Navigator for Current Functional Status.  Therapy/Group: Individual Therapy  Mayco Walrond, Murray Hodgkins 04/20/2016, 3:58 PM

## 2016-04-20 NOTE — Progress Notes (Addendum)
[place] PHYSICAL MEDICINE & REHABILITATION     PROGRESS NOTE  Subjective/Complaints:  Pt seen laying in bed this AM.  Family at bedside.  He is much more alert and interactive this AM.    ROS: Denies CP, SOB, N/V/D.Marland Kitchen  Objective: Vital Signs: Blood pressure (!) 142/70, pulse 68, temperature 98.2 F (36.8 C), temperature source Oral, resp. rate 18, height 5\' 9"  (1.753 m), weight 111 kg (244 lb 11.4 oz), SpO2 98 %. Ct Head Wo Contrast  Result Date: 04/19/2016 CLINICAL DATA:  61 y/o M; history of brainstem stroke for follow-up. EXAM: CT HEAD WITHOUT CONTRAST TECHNIQUE: Contiguous axial images were obtained from the base of the skull through the vertex without intravenous contrast. COMPARISON:  03/30/2016 MRI of the head. FINDINGS: Brain: No evidence of acute infarction, hemorrhage, hydrocephalus, extra-axial collection or mass lesion/mass effect. Foci of hypoattenuation in subcortical and periventricular white matter are stable and compatible with moderate chronic microvascular ischemic changes. Stable mild brain parenchymal volume loss. Stable chronic lacunar infarcts in the basal ganglia and left hemi pons. Slight increased hypoattenuation within the left hemi pons probably corresponds to infarct on prior MRI. Vascular: Calcific atherosclerosis of cavernous and paraclinoid internal carotid arteries. Skull: Normal. Negative for fracture or focal lesion. Sinuses/Orbits: No acute finding. Small stable calcific focus within the left lateral globe and abnormal vitreous signal on prior MRIs likely representing chronic retinal detachment. Other: None. IMPRESSION: 1. No new acute intracranial abnormality. 2. Stable moderate chronic microvascular ischemic changes and mild brain parenchymal volume loss. Crown lacunar infarcts and pons and basal ganglia. 3. Slight increased hypoattenuation within left hemi pons probably corresponding to infarct on prior MRI. Electronically Signed   By: Kristine Garbe  M.D.   On: 04/19/2016 14:26    Recent Labs  04/18/16 0441  WBC 8.4  HGB 12.6*  HCT 40.0  PLT 344    Recent Labs  04/18/16 0441  NA 145  K 3.8  CL 105  GLUCOSE 196*  BUN 36*  CREATININE 1.43*  CALCIUM 9.8   CBG (last 3)   Recent Labs  04/19/16 1638 04/19/16 2104 04/20/16 0635  GLUCAP 181* 118* 161*    Wt Readings from Last 3 Encounters:  04/20/16 111 kg (244 lb 11.4 oz)  03/28/16 111.1 kg (245 lb)  09/04/13 120.2 kg (265 lb)    Physical Exam:  BP (!) 142/70 (BP Location: Left Arm)   Pulse 68   Temp 98.2 F (36.8 C) (Oral)   Resp 18   Ht 5\' 9"  (1.753 m)   Wt 111 kg (244 lb 11.4 oz)   SpO2 98%   BMI 36.14 kg/m  Constitutional: He appears well-developed. Obese. HENT: Normocephalic and atraumatic.  Eyes: EOM are normal. No discharge.  Cardiovascular: RRR. No JVD. Respiratory: Effort normal and breath sounds normal.  GI: Soft. Bowel sounds are normal.  Musculoskeletal: He exhibits no edema. He exhibits no tenderness.  Neurological: Keeps eyes closed. Dysarthric  Limited vision.  Poor insight and awareness.    Motor: 4+/5 RUE prox to distal. Ataxia (unchanged, confounded by vision).  RLE: 4/+/5 proximal to distal  LLE 4+ /5 proximal to distal.  LUE/LLE proximal to distal 5/5.  Skin: Warm and dry. B/l DTIs.  Psychiatric: Flat affect. Limited initiation.   Assessment/Plan: 1. Functional deficits secondary to left paramedian pontine infarct which require 3+ hours per day of interdisciplinary therapy in a comprehensive inpatient rehab setting. Physiatrist is providing close team supervision and 24 hour management of active medical problems  listed below. Physiatrist and rehab team continue to assess barriers to discharge/monitor patient progress toward functional and medical goals.  Function:  Bathing Bathing position   Position: Wheelchair/chair at sink  Bathing parts Body parts bathed by patient: Right arm, Left arm, Chest, Abdomen, Right upper leg,  Left upper leg Body parts bathed by helper: Right lower leg, Left lower leg, Buttocks, Front perineal area  Bathing assist Assist Level: Touching or steadying assistance(Pt > 75%)      Upper Body Dressing/Undressing Upper body dressing   What is the patient wearing?: Pull over shirt/dress     Pull over shirt/dress - Perfomed by patient: Pull shirt over trunk, Put head through opening Pull over shirt/dress - Perfomed by helper: Thread/unthread right sleeve, Thread/unthread left sleeve, Put head through opening, Pull shirt over trunk Button up shirt - Perfomed by patient: Thread/unthread left sleeve, Thread/unthread right sleeve Button up shirt - Perfomed by helper: Pull shirt around back, Button/unbutton shirt    Upper body assist Assist Level:  (Max A)      Lower Body Dressing/Undressing Lower body dressing   What is the patient wearing?: Pants, Socks, Cisco- Performed by helper: Thread/unthread right pants leg, Thread/unthread left pants leg, Pull pants up/down   Non-skid slipper socks- Performed by helper: Don/doff left sock, Don/doff right sock       Shoes - Performed by helper: Don/doff right shoe, Don/doff left shoe, Fasten right, Fasten left   AFO - Performed by helper: Don/doff right AFO, Don/doff left AFO (ACE wraps)   TED Hose - Performed by helper: Don/doff left TED hose, Don/doff right TED hose  Lower body assist Assist for lower body dressing: Touching or steadying assistance (Pt > 75%)      Toileting Toileting Toileting activity did not occur: No continent bowel/bladder event   Toileting steps completed by helper: Adjust clothing prior to toileting, Performs perineal hygiene, Adjust clothing after toileting Toileting Assistive Devices: Other (comment) (stedy)  Toileting assist Assist level: Touching or steadying assistance (Pt.75%)   Transfers Chair/bed transfer   Chair/bed transfer method: Squat pivot Chair/bed transfer assist level: 2  helpers Chair/bed transfer assistive device: Armrests Mechanical lift: Ecologist     Max distance: 15 ft Assist level: 2 helpers   Wheelchair   Type: Manual Max wheelchair distance: 50 ft Assist Level: Dependent (Pt equals 0%)  Cognition Comprehension Comprehension assist level: Understands basic 50 - 74% of the time/ requires cueing 25 - 49% of the time  Expression Expression assist level: Expresses basic 25 - 49% of the time/requires cueing 50 - 75% of the time. Uses single words/gestures.  Social Interaction Social Interaction assist level: Interacts appropriately 25 - 49% of time - Needs frequent redirection.  Problem Solving Problem solving assist level: Solves basic 25 - 49% of the time - needs direction more than half the time to initiate, plan or complete simple activities  Memory Memory assist level: Recognizes or recalls 25 - 49% of the time/requires cueing 50 - 75% of the time    Medical Problem List and Plan: 1.  Right hemiparesis and functional deficits secondary to left paramedian pontine infarct on 1/31  Cont CIR  Baclofen started for spasms d/ced on 2/14 due to lethargy with some improvement  Repeat CT head reviewed 2/22, no acute changes 2. H/o DVT /Anticoagulation: Pharmaceutical: Xarelto 3. Pain Management:   Lidoderm patch started due to therapy reports of pain - patient states improved,  however, ?reliability.  4. Mood: LCSW to follow for evaluation and support.   Fluoxetine started 2/15  Trial amantadine on 2/19, with ?improvement  Trial Ritalin started 2/22, may need to d/c due to ?increased confusion after discussion with PA/SLP, will monitor with therapy today 5. Neuropsych: This patient is not yet capable of making decisions on his own behalf.  Appreciate Neuropsych eval 6. Skin/Wound Care: Routine pressure relief measures. Eucerin to b/l LE.  Prevalon boots ordered for DTI 7. Fluids/Electrolytes/Nutrition: Monitor I/Os  Downgraded  to D2, Nectar  Intake has been variable affecting CBGs  Remeron added 2/15, with improvement 8. HTN: Monitor BP.   Continue coreg, cozaar, Bidil and lasix.     Chlorthalidone increased on 2/8, again on 2/12, increased again on 2/15  Norvasc 5 started 2/22, will likely need further increase 9. T2DM with retinopathy and neuropathy: Blood sugars poorly controlled with A1C- 11.0. Consult RD to educated on [place]. Monitor BS ac/hs.   Novolog 70/30 30 AM, increased to 35U on 2/22  Novolog 70/30 30 PM  Novalog decreased to 2U TID, increased to 5U on 2/21  qHS snack added 2/14  Variable based on intake 10. CAD/ Acute systolic CHF/NICM: compensated. Check daily weights and monitor for signs of overload. Heart healthy/low salt diet. Will need perfusion study on outpatient basis per cardiology. Cont meds Filed Weights   04/18/16 0737 04/19/16 0500 04/20/16 0654  Weight: 110.7 kg (244 lb) 110.2 kg (243 lb) 111 kg (244 lb 11.4 oz)   Clinically stable 11. Morbid Obesity: BMI - 36. Encourage appropriate diet, weight loss and exercise.  12. CKD:   Cr. 1.43 on 2/21 (around baseline)  Encourage fluids  IVF qhs at 50cc/hr, d/ced 2/20  Labs ordered for Monday 13. Hypokalemia:   Increased K+ supplement 2/9  K+ 3.8 on 2/21  Continue supplement  Labs ordered for Monday 14. ABLA  Hb 12.6 on 2/21  Cont to monitor  Labs ordered for Monday 15. Acute lower UTI  UA+, Ucx with Pseudomonas and enterococcus  Spoke with pharmacy, macrobid changed to Cipro, completed 2/12-2/18  LOS (Days) 17 A FACE TO FACE EVALUATION WAS PERFORMED  Ankit Lorie Phenix 04/20/2016 8:23 AM

## 2016-04-20 NOTE — Progress Notes (Signed)
Occupational Therapy Session Note  Patient Details  Name: [name] MRN: [id number] Date of Birth: 04/18/1955  Today's Date: 04/20/2016 OT Individual Time:  11:17- 12:05   48 min   Short Term Goals: Week 1:  OT Short Term Goal 1 (Week 1): Pt will complete LB dressing with max assist of one caregiver OT Short Term Goal 1 - Progress (Week 1): Partly met OT Short Term Goal 2 (Week 1): Pt will independently place RUE into functional position during self-care tasks to improve awareness of RUE/minimize UE injury OT Short Term Goal 2 - Progress (Week 1): Met OT Short Term Goal 3 (Week 1): Pt will complete sit > stand with mod assist to decrease burden of care with LB dressing/hygiene OT Short Term Goal 3 - Progress (Week 1): Progressing toward goal  Skilled Therapeutic Interventions/Progress Updates:    Pt seen seated in w/c asleep. Took multimodal cues and increased time to arouse. Pt agreeable to tx, and requesting water. Provided nectar thick liquids per SLP note and pt required verbal cues to take small sips. RN entered room to administer medicateion/glucerna. Provided HOH A of RUE to stabilize cup of glucerna and bring to mouth. At sink pt brushes teeth with RUE holding built up tooth brush handle to compensate for decreased dexterity/grip strength. Pt required HOH A to bring tooth brush to mouth due to ataxic movement with VC for initiation. Pt able to locate 3/4 items on R side of sink with VC to scan to R. Pt washes face and brushes hair with RUE with same assist as stated above. Pt practices grasp and release of wash cloth x10 while seated at sink. Pt reports needing to void urine. With +2 for balance, pt stands in stedy with MOD A for lifting and VC to shift weight to L. Pt transferred to bed  On stedy so nurse tech can scan bladder. Pt left in room with nurse tech and all needs met.  Therapy Documentation Precautions:  Precautions Precautions: Fall Restrictions Weight Bearing  Restrictions: No Pain:  no c/o pain ADL: ADL ADL Comments: Please see functional navigator See Function Navigator for Current Functional Status.   Therapy/Group: Individual Therapy   M  04/20/2016, 12:24 PM 

## 2016-04-20 NOTE — Progress Notes (Signed)
Occupational Therapy Session Note  Patient Details  Name: [name] MRN: [id number] Date of Birth: April 15, 1955  Today's Date: 04/20/2016 OT Individual Time: 0900-1000 OT Individual Time Calculation (min): 60 min    Short Term Goals: Week 3:  OT Short Term Goal 1 (Week 3): Pt will utilize R UE during functional activity 25% of the time with min verbal cues OT Short Term Goal 2 (Week 3): Pt will maintain midline posture during grooming task at the sink for 2 mins with mod verbal cues OT Short Term Goal 3 (Week 3): Pt will don shirt using hemi-techniques with Mod A of 1  Skilled Therapeutic Interventions/Progress Updates:    Pt greeted in bed with eyes open and more alert, daughter present. 1:1 OT session focused on transfers, functional use of R Ue, B UE coordination, attention, midline orientation, and postural control. Bed level LB dressing with assistance to thread pant legs, rolling technique with  max verbal and tactile cues to integrate R UE with pulling pants of hips.Sliding board transfer completed to TIS wc w/ overall Max A +2 manual facilitation to weight shift for scooting. B UE coordination and attention with graded ball toss activity. Scan L>R for moving target- able to sustain for attention for ~2 mins in moderately distracting environment with max verbal and visual cues. R NMR + mirror feedback for visual cues to achieve midline posture. Pt able to stay in midline for ~ 1 minute without distractions, increased difficulty with postural control with added activity demands. Modified CIMT utilized to encourage use of R UE with graded cup activity.Pt returned to room at end of session and left in TIS wc with needs met, safety belt on, and daughter present.   Therapy Documentation Pain: Pain Assessment Pain Assessment: No/denies pain ADL: ADL ADL Comments: Please see functional navigator  See Function Navigator for Current Functional Status.   Therapy/Group: Individual  Therapy  Valma Cava 04/20/2016, 10:06 AM

## 2016-04-21 ENCOUNTER — Inpatient Hospital Stay (HOSPITAL_COMMUNITY): Payer: Medicare Other | Admitting: Occupational Therapy

## 2016-04-21 LAB — GLUCOSE, CAPILLARY
GLUCOSE-CAPILLARY: 179 mg/dL — AB (ref 65–99)
Glucose-Capillary: 163 mg/dL — ABNORMAL HIGH (ref 65–99)
Glucose-Capillary: 122 mg/dL — ABNORMAL HIGH (ref 65–99)
Glucose-Capillary: 178 mg/dL — ABNORMAL HIGH (ref 65–99)

## 2016-04-21 LAB — URINE CULTURE: CULTURE: NO GROWTH

## 2016-04-21 NOTE — Progress Notes (Signed)
Occupational Therapy Session Note  Patient Details  Name: [name] MRN: [place]:[id number] Date of Birth: 1955-12-17  Today's Date: 04/21/2016 OT Individual Time: 1300-1345 OT Individual Time Calculation (min): 45 min    Short Term Goals: Week 3:  OT Short Term Goal 1 (Week 3): Pt will utilize R UE during functional activity 25% of the time with min verbal cues OT Short Term Goal 2 (Week 3): Pt will maintain midline posture during grooming task at the sink for 2 mins with mod verbal cues OT Short Term Goal 3 (Week 3): Pt will don shirt using hemi-techniques with Mod A of 1  Skilled Therapeutic Interventions/Progress Updates:    1:1 OT session focused on modified LB dressing techniques, sit<>stand, toilet transfers and dynamic standing balance. Pt more alert today, answering questions consistently, decreased pushing, and able to maintain midline orientation 50% of session in sitting. LB dressing techniques seated EOB using long-handled reacher. With Mod instructional cues and demonstration, pt able to utilize grasp mechanism of reacher to thread R LE into pants. Pt still needed mod A to orient pants, then Mod A to thread L Le. Sit<>stand with Mod A in stedy, noted incontinence of stool so brought to transfer onto toilet with stedy and +2 assist. Pt with successful Bm, requiring total A for peri-care and clothing management. Pt initiated use of R UE with min VC 40 % of the time this session during LB ADL. Pt left seated in TIS wc at end of session with safety belt on and call bell in reach.  Therapy Documentation Precautions:  Precautions Precautions: Fall Restrictions Weight Bearing Restrictions: No Pain:   denies pain ADL: ADL ADL Comments: Please see functional navigator  See Function Navigator for Current Functional Status.   Therapy/Group: Individual Therapy  Valma Cava 04/21/2016, 1:33 PM

## 2016-04-21 NOTE — Progress Notes (Signed)
[place] PHYSICAL MEDICINE & REHABILITATION     PROGRESS NOTE  Subjective/Complaints:  Patient is alert and answers questions. He denies pain. No other complaints.    Objective: Vital Signs: Blood pressure (!) 147/63, pulse 65, temperature 98.5 F (36.9 C), temperature source Oral, resp. rate 18, height 5\' 9"  (1.753 m), weight 247 lb (112 kg), SpO2 95 %.  Overweight male in no acute distress. HEENT exam: Atraumatic, normocephalic Chest clear to auscultation Cardiac exam S1 and S2 are regular Abdominal exam overweight, active bowel sounds, soft Extremities no edema  Assessment/Plan: 1. Functional deficits secondary to left paramedian pontine infarct   Medical Problem List and Plan: 1.  Right hemiparesis and functional deficits secondary to left paramedian pontine infarct on 1/31  Cont CIR   2. H/o DVT /Anticoagulation: Pharmaceutical: Xarelto 3. Pain Management:   Currently denies pain  4. Mood: LCSW to follow for evaluation and support.   Fluoxetine started 2/15  Trial amantadine on 2/19, with ?improvement  Trial Ritalin started 2/22, may need to d/c due to ?increased confusion  5. Neuropsych: This patient is not yet capable of making decisions on his own behalf.  Appreciate Neuropsych eval 6. Skin/Wound Care: Routine pressure relief measures. Eucerin to b/l LE.  Prevalon boots ordered for DTI 7. Fluids/Electrolytes/Nutrition: Monitor I/Os   D2, Nectar  Intake has been variable affecting CBGs  Remeron added 2/15,  8. HTN: Monitor BP.  114/51-147/63- will monitor for now  Continue coreg, cozaar, Bidil and lasix.     Chlorthalidone increased on 2/8, again on 2/12, increased again on 2/15  Norvasc 5 started 2/22, will likely need further increase 9. T2DM with retinopathy and neuropathy: Blood sugars poorly controlled with A1C- 11.0. Consult RD to educated on [place]. Monitor BS ac/hs.  CBG (last 3)   Recent Labs  04/20/16 1647 04/20/16 2013 04/21/16 0653    GLUCAP 222* 224* 163*    Novolog 70/30 30 AM, increased to 35U on 2/22  Novolog 70/30 30 PM  Novalog decreased to 2U TID, increased to 5U on 2/21, increased to 8U on 2/24  qHS snack added 2/14  Variable based on intake 10. CAD/ Acute systolic CHF/NICM: compensated. Check daily weights and monitor for signs of overload. Heart healthy/low salt diet. Will need perfusion study on outpatient basis per cardiology. Cont meds Filed Weights   04/19/16 0500 04/20/16 0654 04/21/16 0552  Weight: 243 lb (110.2 kg) 244 lb 11.4 oz (111 kg) 247 lb (112 kg)   Clinically stable 11. Morbid Obesity: BMI - 36.  12. CKD:   Cr. 1.43 on 2/21 (around baseline)  Encourage fluids  IVF qhs at 50cc/hr, d/ced 2/20  Labs ordered for 2/26 13. Hypokalemia:   Increased K+ supplement 2/9  K+ 3.8 on 2/21  Continue supplement  Labs ordered for Monday Lab Results  Component Value Date   K 3.8 04/18/2016    14. ABLA  Hb 12.6 on 2/21  Cont to monitor  Labs ordered for Monday 15. Acute lower UTI  UA+, Ucx with Pseudomonas and enterococcus  , macrobid changed to Cipro, completed 2/12-2/18  LOS (Days) 18 A FACE TO FACE EVALUATION WAS PERFORMED  Doneen Ollinger H Fredonia Casalino 04/21/2016 8:36 AM

## 2016-04-21 NOTE — Plan of Care (Signed)
Problem: RH BOWEL ELIMINATION Goal: RH STG MANAGE BOWEL WITH ASSISTANCE STG Manage Bowel with mod Assistance.    Outcome: Not Progressing No BM since 04/18/16 Goal: RH STG MANAGE BOWEL W/MEDICATION W/ASSISTANCE STG Manage Bowel with Medication with min Assistance.    Outcome: Not Progressing No bm since 04/18/16   Problem: RH BLADDER ELIMINATION Goal: RH STG MANAGE BLADDER WITH ASSISTANCE STG Manage Bladder With mod Assistance   Outcome: Not Progressing Patient continues I/O cath

## 2016-04-22 ENCOUNTER — Inpatient Hospital Stay (HOSPITAL_COMMUNITY): Payer: Medicare Other | Admitting: Physical Therapy

## 2016-04-22 LAB — GLUCOSE, CAPILLARY
GLUCOSE-CAPILLARY: 220 mg/dL — AB (ref 65–99)
GLUCOSE-CAPILLARY: 196 mg/dL — AB (ref 65–99)
GLUCOSE-CAPILLARY: 215 mg/dL — AB (ref 65–99)
Glucose-Capillary: 258 mg/dL — ABNORMAL HIGH (ref 65–99)

## 2016-04-22 LAB — URINALYSIS, COMPLETE (UACMP) WITH MICROSCOPIC
BACTERIA UA: NONE SEEN
Bilirubin Urine: NEGATIVE
Glucose, UA: NEGATIVE mg/dL
HGB URINE DIPSTICK: NEGATIVE
Ketones, ur: NEGATIVE mg/dL
Leukocytes, UA: NEGATIVE
NITRITE: NEGATIVE
PROTEIN: NEGATIVE mg/dL
RBC / HPF: NONE SEEN RBC/hpf (ref 0–5)
SPECIFIC GRAVITY, URINE: 1.01 (ref 1.005–1.030)
WBC UA: NONE SEEN WBC/hpf (ref 0–5)
pH: 5 (ref 5.0–8.0)

## 2016-04-22 MED ORDER — POLYETHYLENE GLYCOL 3350 17 G PO PACK
17.0000 g | PACK | Freq: Every day | ORAL | Status: DC
  Administered 2016-04-23 – 2016-04-25 (×3): 17 g via ORAL
  Filled 2016-04-22 (×3): qty 1

## 2016-04-22 MED ORDER — INSULIN ASPART 100 UNIT/ML ~~LOC~~ SOLN
8.0000 [IU] | Freq: Three times a day (TID) | SUBCUTANEOUS | Status: DC
  Administered 2016-04-22 – 2016-04-25 (×9): 8 [IU] via SUBCUTANEOUS

## 2016-04-22 NOTE — Progress Notes (Signed)
Physical Therapy Session Note  Patient Details  Name: [name] MRN: [id number] Date of Birth: 05/13/1955  Today's Date: 04/22/2016 PT Individual Time: 1420-1530 PT Individual Time Calculation (min): 70 min   Short Term Goals: Week 3:  PT Short Term Goal 1 (Week 3): = LTGs due to anticipated LOS  Skilled Therapeutic Interventions/Progress Updates:   Patient awake in bed with daughter Velna Hatchet and boyfriend present to observe therapy. Daughter reports patient febrile at this time. Patient sat EOB using rail with HOB raised with max A to elevate trunk but demonstrating improved ability to bring BLE toward edge of bed. Patient required increased assistance for static sitting balance EOB due to consistent posterior lean and unable to correct with verbal cues and max assistance. Donned pants total A with sit > stand from EOB with max A. Squat pivot transfer bed > wheelchair with max A overall. Transferred sit <> supine on flat mat table with max A and multimodal cues for sequencing and technique. In supine, patient worked on assisted transitional rolling movements to R and L reaching across body with focus on trunk activation, neuromuscular re-ed, gross flexion and rotation, and controlled movement with slow speed. Patient fearful of rolling to L and demonstrating uncontrolled eccentric descent to supine. Sitting EOM with focus on midline orientation with patient able to sustain midline upright posture approx 2 min with supervision but required consistent mod-max A to prevent LOB due to leaning to L, R, or anteriorly. Worked on transitional movements from sit <> squat and sit <> stand position with mirror for visual feedback but unable to safely continue standing or sit > stand tasks due to anterior and lateral  Lean to R. In weightbearing on R forearm, practiced reaching up and overhead with LUE to target to facilitate RUE and R trunk activation with no activation observed. Patient utilized Charlaine Dalton to return  to room after bowel incontinence and left sitting on toilet in [place] with nurse tech.   Therapy Documentation Precautions:  Precautions Precautions: Fall Restrictions Weight Bearing Restrictions: No Pain: Pain Assessment Pain Assessment: Faces Faces Pain Scale: No hurt   See Function Navigator for Current Functional Status.   Therapy/Group: Individual Therapy  Amond Speranza, Murray Hodgkins 04/22/2016, 3:48 PM

## 2016-04-22 NOTE — Progress Notes (Signed)
[place] PHYSICAL MEDICINE & REHABILITATION     PROGRESS NOTE  Subjective/Complaints:  No complaints. Sleeping, easily awakens. No pain Nursing note: no BM since 2/21  Objective: Vital Signs: Blood pressure (!) 143/63, pulse 69, temperature 98.1 F (36.7 C), temperature source Oral, resp. rate 18, height 5\' 9"  (1.753 m), weight 254 lb (115.2 kg), SpO2 93 %.  Overweight male in no acute distress. HEENT exam: Atraumatic, normocephalic Chest clear to auscultation Cardiac exam S1 and S2 are regular Abdominal exam overweight, active bowel sounds, soft Extremities no edema  Assessment/Plan: 1. Functional deficits secondary to left paramedian pontine infarct   Medical Problem List and Plan: 1.  Right hemiparesis and functional deficits secondary to left paramedian pontine infarct on 1/31  Cont CIR   2. H/o DVT /Anticoagulation: Pharmaceutical: Xarelto 3. Pain Management:   Currently denies pain  4. Mood: LCSW to follow for evaluation and support.   Fluoxetine started 2/15  Trial amantadine on 2/19, with ?improvement  Trial Ritalin started 2/22, may need to d/c due to ?increased confusion  5. Neuropsych: This patient is not yet capable of making decisions on his own behalf.  Appreciate Neuropsych eval 6. Skin/Wound Care: Routine pressure relief measures. Eucerin to b/l LE.  Prevalon boots ordered for DTI 7. Fluids/Electrolytes/Nutrition: Monitor I/Os   D2, Nectar  Intake has been variable affecting CBGs  Remeron added 2/15,  8. HTN: Monitor BP.  114/61-143/63- fair control  Continue coreg, cozaar, Bidil and lasix.     Chlorthalidone increased on 2/8, again on 2/12, increased again on 2/15  Norvasc 5 started 2/22,  9. T2DM with retinopathy and neuropathy: Blood sugars poorly controlled with A1C- 11.0. Consult RD to educated on [place]. Monitor BS ac/hs.  CBG (last 3)   Recent Labs  04/21/16 1646 04/21/16 2102 04/22/16 0637  GLUCAP 179* 178* 220*    Novolog 70/30  30 AM, increased to 35U on 2/22  Novolog 70/30 30 PM  Novalog decreased to 2U TID, increased to 5U on 2/21, increased to 8U on 2/24 (this was inadvertently not done, done on 2/25)  qHS snack added 2/14  Variable based on intake 10. CAD/ Acute systolic CHF/NICM: compensated. Check daily weights and monitor for signs of overload. Heart healthy/low salt diet. Will need perfusion study on outpatient basis per cardiology. Cont meds Filed Weights   04/20/16 0654 04/21/16 0552 04/22/16 0511  Weight: 244 lb 11.4 oz (111 kg) 247 lb (112 kg) 254 lb (115.2 kg)   Clinically stable 11. Morbid Obesity: BMI - 36.  12. CKD:   Cr. 1.43 on 2/21 (around baseline)  Encourage fluids  IVF qhs at 50cc/hr, d/ced 2/20  Labs ordered for 2/26 13. Hypokalemia:   Increased K+ supplement 2/9  K+ 3.8 on 2/21  Continue supplement  Labs ordered for Monday Lab Results  Component Value Date   K 3.8 04/18/2016    14. ABLA  Hb 12.6 on 2/21  Cont to monitor  Labs ordered for Monday 15. Acute lower UTI  UA+, Ucx with Pseudomonas and enterococcus  , macrobid changed to Cipro, completed 2/12-2/18  LOS (Days) 19 A FACE TO FACE EVALUATION WAS PERFORMED  [name] H Laverle Pillard 04/22/2016 8:13 AM

## 2016-04-23 ENCOUNTER — Inpatient Hospital Stay (HOSPITAL_COMMUNITY): Payer: Medicare Other | Admitting: Occupational Therapy

## 2016-04-23 ENCOUNTER — Inpatient Hospital Stay (HOSPITAL_COMMUNITY): Payer: Medicare Other | Admitting: Speech Pathology

## 2016-04-23 ENCOUNTER — Inpatient Hospital Stay (HOSPITAL_COMMUNITY): Payer: Medicare Other

## 2016-04-23 DIAGNOSIS — R509 Fever, unspecified: Secondary | ICD-10-CM | POA: Insufficient documentation

## 2016-04-23 LAB — BASIC METABOLIC PANEL
ANION GAP: 14 (ref 5–15)
BUN: 32 mg/dL — AB (ref 4–21)
BUN: 32 mg/dL — AB (ref 6–20)
CHLORIDE: 109 mmol/L (ref 101–111)
CO2: 23 mmol/L (ref 22–32)
CREATININE: 1.5 mg/dL — AB (ref 0.6–1.3)
Calcium: 9.8 mg/dL (ref 8.9–10.3)
Creatinine, Ser: 1.46 mg/dL — ABNORMAL HIGH (ref 0.61–1.24)
GFR calc Af Amer: 59 mL/min — ABNORMAL LOW (ref >60)
GFR calc non Af Amer: 51 mL/min — ABNORMAL LOW (ref >60)
GLUCOSE: 309 mg/dL
GLUCOSE: 309 mg/dL — AB (ref 65–99)
POTASSIUM: 4.3 mmol/L (ref 3.5–5.1)
Potassium: 4.3 mmol/L (ref 3.4–5.3)
Sodium: 146 mmol/L (ref 137–147)
Sodium: 146 mmol/L — ABNORMAL HIGH (ref 135–145)

## 2016-04-23 LAB — CBC WITH DIFFERENTIAL/PLATELET
Basophils Absolute: 0.1 10*3/uL (ref 0.0–0.1)
Basophils Relative: 1 %
EOS PCT: 3 %
Eosinophils Absolute: 0.2 10*3/uL (ref 0.0–0.7)
HEMATOCRIT: 43.7 % (ref 39.0–52.0)
HEMOGLOBIN: 13.7 g/dL (ref 13.0–17.0)
LYMPHS ABS: 1.3 10*3/uL (ref 0.7–4.0)
LYMPHS PCT: 20 %
MCH: 29 pg (ref 26.0–34.0)
MCHC: 31.4 g/dL (ref 30.0–36.0)
MCV: 92.4 fL (ref 78.0–100.0)
MONOS PCT: 6 %
Monocytes Absolute: 0.4 10*3/uL (ref 0.1–1.0)
NEUTROS ABS: 4.7 10*3/uL (ref 1.7–7.7)
Neutrophils Relative %: 70 %
Platelets: 237 10*3/uL (ref 150–400)
RBC: 4.73 MIL/uL (ref 4.22–5.81)
RDW: 13.1 % (ref 11.5–15.5)
WBC: 6.7 10*3/uL (ref 4.0–10.5)

## 2016-04-23 LAB — GLUCOSE, CAPILLARY
GLUCOSE-CAPILLARY: 275 mg/dL — AB (ref 65–99)
GLUCOSE-CAPILLARY: 263 mg/dL — AB (ref 65–99)
Glucose-Capillary: 217 mg/dL — ABNORMAL HIGH (ref 65–99)
Glucose-Capillary: 140 mg/dL — ABNORMAL HIGH (ref 65–99)

## 2016-04-23 LAB — CBC AND DIFFERENTIAL: WBC: 6.7 10*3/mL

## 2016-04-23 MED ORDER — INSULIN ASPART PROT & ASPART (70-30 MIX) 100 UNIT/ML ~~LOC~~ SUSP
45.0000 [IU] | Freq: Every day | SUBCUTANEOUS | Status: DC
  Administered 2016-04-23 – 2016-04-25 (×3): 45 [IU] via SUBCUTANEOUS
  Filled 2016-04-23: qty 10

## 2016-04-23 MED ORDER — AMLODIPINE BESYLATE 10 MG PO TABS: 10.0000 mg | ORAL_TABLET | Freq: Every day | ORAL | Status: DC

## 2016-04-23 MED ORDER — AMLODIPINE BESYLATE 10 MG PO TABS
10.0000 mg | ORAL_TABLET | Freq: Every day | ORAL | Status: DC
  Administered 2016-04-23 – 2016-04-25 (×3): 10 mg via ORAL
  Filled 2016-04-23 (×3): qty 1

## 2016-04-23 NOTE — Progress Notes (Signed)
Physical Therapy Session Note  Patient Details  Name: [name] MRN: [id number] Date of Birth: 1955-03-11  Today's Date: 04/23/2016 PT Individual Time: 1004-1104 PT Individual Time Calculation (min): 60 min   Short Term Goals: Week 3:  PT Short Term Goal 1 (Week 3): = LTGs due to anticipated LOS  Skilled Therapeutic Interventions/Progress Updates:    Session focused on neuro re-ed to address postural control, transitional movements, sustained and graded movement, re-orientation to midline, reciprocal movement pattern re-training, balance, forced use,coordination and weightbearing on hemiparetic side during functional transfers, sit <>squat/stand, gait, and w/c propulsion using BLE. Pt required manual and verbal cues throughout to facilitate fluid movement pattern and slowing speed to increase control. Pt completed squat pivot transfer and stand pivot transfer during session with mod assist from mat <> w/c with cues and facilitation for weightshift, hand placement, and attention. Blocked practice sit <> squat progressing static hold from 3 seconds to 12 seconds in squatted position. Pt demonstrating improved static and dynamic sitting balance the first 30 min of session with supervision to steady assist. As fatigued decreased balance noted and required min to mod assist to correct. Propped on elbow on RUE for activation and weightbearing through RLE to progress bed mobility with min assist. Once pt fatigued, and LOB occurring to R, positioned patient in propped on L side demonstrating improved carryover with sitting balance. +2 assist for neuro re-ed during gait with facilitation for weightshift, blocking of RLE to prevent adduction and manual and verbal cues for upright posture (x 10' with third person for close w/c follow) - had to stop due to decreased ability to control RLE. End of session set up in w/c with safety belt donned and all needs in reach.   Therapy Documentation Precautions:   Precautions Precautions: Fall Restrictions Weight Bearing Restrictions: No  Pain: Pt did not verbalize pain throughout session.    See Function Navigator for Current Functional Status.   Therapy/Group: Individual Therapy  [name], PT, DPT  04/23/2016, 11:06 AM

## 2016-04-23 NOTE — Progress Notes (Addendum)
[place] PHYSICAL MEDICINE & REHABILITATION     PROGRESS NOTE  Subjective/Complaints:  Pt seen working with OT this AM.  He slept well overnight.  He states he feels better than last week, still with limited initiation.  He feels "fine".    ROS: Denies CP, SOB, N/V/D.Marland Kitchen  Objective: Vital Signs: Blood pressure (!) 174/66, pulse 95, temperature 98.1 F (36.7 C), temperature source Oral, resp. rate 19, height 5\' 9"  (1.753 m), weight 114.9 kg (253 lb 4.9 oz), SpO2 99 %. No results found. No results for input(s): WBC, HGB, HCT, PLT in the last 72 hours. No results for input(s): NA, K, CL, GLUCOSE, BUN, CREATININE, CALCIUM in the last 72 hours.  Invalid input(s): CO CBG (last 3)   Recent Labs  04/22/16 1647 04/22/16 2058 04/23/16 0639  GLUCAP 258* 215* 217*    Wt Readings from Last 3 Encounters:  04/23/16 114.9 kg (253 lb 4.9 oz)  03/28/16 111.1 kg (245 lb)  09/04/13 120.2 kg (265 lb)    Physical Exam:  BP (!) 174/66 (BP Location: Left Arm)   Pulse 95   Temp 98.1 F (36.7 C) (Oral)   Resp 19   Ht 5\' 9"  (1.753 m)   Wt 114.9 kg (253 lb 4.9 oz)   SpO2 99%   BMI 37.41 kg/m  Constitutional: He appears well-developed. Obese. HENT: Normocephalic and atraumatic.  Eyes: EOM are normal. No discharge.  Cardiovascular: RRR. No JVD. Respiratory: Effort normal and breath sounds normal.  GI: Soft. Bowel sounds are normal.  Musculoskeletal: He exhibits no edema. He exhibits no tenderness.  Neurological: Keeps eyes closed. Dysarthric  Limited vision.  Poor insight and awareness.    Motor: 4+/5 RUE prox to distal. Ataxia +/- dysmetria (unchanged, confounded by vision).  RLE: 4/+/5 proximal to distal  LLE 4+ /5 proximal to distal.  LUE/LLE proximal to distal 5/5.  Skin: Warm and dry. B/l DTIs.  Psychiatric: Flat affect. Limited initiation.   Assessment/Plan: 1. Functional deficits secondary to left paramedian pontine infarct which require 3+ hours per day of interdisciplinary  therapy in a comprehensive inpatient rehab setting. Physiatrist is providing close team supervision and 24 hour management of active medical problems listed below. Physiatrist and rehab team continue to assess barriers to discharge/monitor patient progress toward functional and medical goals.  Function:  Bathing Bathing position   Position: Wheelchair/chair at sink  Bathing parts Body parts bathed by patient: Right arm, Left arm, Chest, Abdomen, Right upper leg, Left upper leg Body parts bathed by helper: Right lower leg, Left lower leg, Buttocks, Front perineal area  Bathing assist Assist Level: Touching or steadying assistance(Pt > 75%)      Upper Body Dressing/Undressing Upper body dressing   What is the patient wearing?: Pull over shirt/dress     Pull over shirt/dress - Perfomed by patient: Pull shirt over trunk, Put head through opening Pull over shirt/dress - Perfomed by helper: Thread/unthread right sleeve, Thread/unthread left sleeve, Put head through opening, Pull shirt over trunk Button up shirt - Perfomed by patient: Thread/unthread left sleeve, Thread/unthread right sleeve Button up shirt - Perfomed by helper: Pull shirt around back, Button/unbutton shirt    Upper body assist Assist Level:  (Max A)      Lower Body Dressing/Undressing Lower body dressing   What is the patient wearing?: Pants     Pants- Performed by patient: Thread/unthread left pants leg Pants- Performed by helper: Thread/unthread right pants leg, Pull pants up/down   Non-skid slipper socks- Performed by  helper: Don/doff left sock, Don/doff right sock       Shoes - Performed by helper: Don/doff right shoe, Don/doff left shoe, Fasten right, Fasten left   AFO - Performed by helper: Don/doff right AFO, Don/doff left AFO (ACE wraps)   TED Hose - Performed by helper: Don/doff left TED hose, Don/doff right TED hose  Lower body assist Assist for lower body dressing: Touching or steadying assistance (Pt  > 75%)      Toileting Toileting Toileting activity did not occur: No continent bowel/bladder event   Toileting steps completed by helper: Adjust clothing prior to toileting, Adjust clothing after toileting, Performs perineal hygiene Toileting Assistive Devices: Other (comment) Charlaine Dalton)  Toileting assist Assist level: Two helpers   Transfers Chair/bed transfer   Chair/bed transfer method: Squat pivot Chair/bed transfer assist level: Maximal assist (Pt 25 - 49%/lift and lower) Chair/bed transfer assistive device: Armrests Mechanical lift: Ecologist     Max distance: 20 ft Assist level: 2 helpers   Wheelchair   Type: Manual Max wheelchair distance: 50 ft Assist Level: Dependent (Pt equals 0%)  Cognition Comprehension Comprehension assist level: Understands basic 50 - 74% of the time/ requires cueing 25 - 49% of the time  Expression Expression assist level: Expresses basic 25 - 49% of the time/requires cueing 50 - 75% of the time. Uses single words/gestures.  Social Interaction Social Interaction assist level: Interacts appropriately 25 - 49% of time - Needs frequent redirection.  Problem Solving Problem solving assist level: Solves basic 25 - 49% of the time - needs direction more than half the time to initiate, plan or complete simple activities  Memory Memory assist level: Recognizes or recalls 25 - 49% of the time/requires cueing 50 - 75% of the time    Medical Problem List and Plan: 1.  Right hemiparesis and functional deficits secondary to left paramedian pontine infarct on 1/31  Cont CIR  Baclofen started for spasms d/ced on 2/14 due to lethargy with some improvement  Repeat CT head reviewed 2/22, no acute changes  Weekend notes and values reviewed - fever, undocumented, will cont to montior 2. H/o DVT /Anticoagulation: Pharmaceutical: Xarelto 3. Pain Management:   Lidoderm patch started due to therapy reports of pain - patient states improved,  however, ?reliability.  4. Mood: LCSW to follow for evaluation and support.   Fluoxetine started 2/15  Amantadine started on 2/19, with ?improvement  Trial Ritalin started 2/22 5. Neuropsych: This patient is not yet capable of making decisions on his own behalf.  Appreciate Neuropsych eval 6. Skin/Wound Care: Routine pressure relief measures. Eucerin to b/l LE.  Prevalon boots ordered for DTI 7. Fluids/Electrolytes/Nutrition: Monitor I/Os  Downgraded to D2, Nectar  Intake has been variable affecting CBGs  Remeron added 2/15, with improvement 8. HTN: Monitor BP.   Continue coreg, cozaar, Bidil and lasix.     Chlorthalidone increased on 2/8, again on 2/12, increased again on 2/15  Norvasc 5 started 2/22, increased on 2/26  Will order renal ultrasound 9. T2DM with retinopathy and neuropathy: Blood sugars poorly controlled with A1C- 11.0. Consult RD to educated on [place]. Monitor BS ac/hs.   Novolog 70/30 30 AM, increased to 35U on 2/22, increased to 45U on 2/26  Novolog 70/30 30 PM  Novalog decreased to 2U TID, increased to 5U on 2/21  qHS snack added 2/14  Variable based on intake 10. CAD/ Acute systolic CHF/NICM: compensated. Check daily weights and monitor for signs of overload. Heart healthy/low  salt diet. Will need perfusion study on outpatient basis per cardiology. Cont meds Filed Weights   04/21/16 0552 04/22/16 0511 04/23/16 0433  Weight: 112 kg (247 lb) 115.2 kg (254 lb) 114.9 kg (253 lb 4.9 oz)   Clinically stable 11. Morbid Obesity: BMI - 36. Encourage appropriate diet, weight loss and exercise.  12. CKD:   Cr. 1.43 on 2/21 (around baseline)  Encourage fluids  IVF qhs at 50cc/hr, d/ced 2/20  Labs pending 13. Hypokalemia:   Increased K+ supplement 2/9  K+ 3.8 on 2/21  Continue supplement  Labs pending 14. ABLA  Hb 12.6 on 2/21  Cont to monitor  Labs pending 15. Acute lower UTI  UA+, Ucx with Pseudomonas and enterococcus  Spoke with pharmacy, macrobid changed  to Cipro, completed 2/12-2/18  LOS (Days) 20 A FACE TO FACE EVALUATION WAS PERFORMED  [name] 04/23/2016 8:36 AM

## 2016-04-23 NOTE — Plan of Care (Signed)
Problem: RH BLADDER ELIMINATION Goal: RH STG MANAGE BLADDER WITH ASSISTANCE STG Manage Bladder With mod Assistance   Outcome: Not Progressing Patient still unable to urinate on his own. I&o cath.

## 2016-04-23 NOTE — Progress Notes (Signed)
Occupational Therapy Session Note  Patient Details  Name: [name] MRN: [id number] Date of Birth: 1956/01/20  Today's Date: 04/23/2016  Session 1 OT Individual Time: 0800-0900 OT Individual Time Calculation (min): 60 min   Session 2 OT Individual Time: 1300-1345 OT Individual Time Calculation (min): 45 min    Short Term Goals: Week 3:  OT Short Term Goal 1 (Week 3): Pt will utilize R UE during functional activity 25% of the time with min verbal cues OT Short Term Goal 2 (Week 3): Pt will maintain midline posture during grooming task at the sink for 2 mins with mod verbal cues OT Short Term Goal 3 (Week 3): Pt will don shirt using hemi-techniques with Mod A of 1  Skilled Therapeutic Interventions/Progress Updates:  Session 1   OT session focused sequencing,  R attention, functional use of R Ue, and sit<>stands within modified bathing/dressing tasks. Pt more alert and interactive this morning, even initiating conversation with therapist stating "I didn't think you would be here today." .Squatpivot transfer with overalll Max A. Pt initiated B UE coordination to ring out wash cloth for bathing, able to grasp wash cloth with R UE and maintain grasp with supervision to wash L arm today. Pt able to doff R and L sock today with increased time and max multimodal cues. Sit<>stand with Mod/max A with focus on weight shifting to decrease push to L,R. Pt initiated pulling pants over hips today with  B Ue, but had dificulty coordinating R UE while maintaining standing balance. Ataxia, and overshooting continue to make orienting clothing difficult. Utilized backwards chaining to increase success of donning shirt using hemi-techniques. Overall Mod A for UB dressing and Max A LB dressing. Pt left seated in TIS wc at end of session with needs met and RN present.   Session 2 Pt awake in w/c upon OT arrival. Pt hallucinating this afternoon stating "when did that man get here" and pointed to the L corner of  the room. Re-affirmed that there was no man in the room, but pt stated " yes there is, over there" Asked pt orientation questions, pt able to state he is in the hospital but stated "i don't know" when asked the year. Re-oriented pt to date and situation. Pt brought to therapy gym for treatment session focused on R visual scanning, R UE ataxia, spatial awareness, hand-eye coordination, and sitting balance utilizing dynavision and graded bead threading activity. Manual facilitation to bring pt into midline, overall min/mod A to maintain trunk control when reaching outside base of support to push button. Mod multimodal cues to scan and locate red lights on upper and lower R quadrant, as well as cues to utilize R Ue. Pt pushed 14 buttons in first  2 minute round, then began "shutting down" half way through second 2 minute round only pushing 3 buttons with max multimodal cues open eyes and even reach towards light board with R hand. Attempted to re-engage pt with graded beading activity for B UE coordination, however, pt's waxing and waning arousal continued to limit participation. Pt returned to room and left seated in TIS wc with safety belt on and needs met.    Therapy Documentation Precautions:  Precautions Precautions: Fall Restrictions Weight Bearing Restrictions: No Pain:  denies pain ADL: ADL ADL Comments: Please see functional navigator  See Function Navigator for Current Functional Status.   Therapy/Group: Individual Therapy  Valma Cava 04/23/2016, 3:49 PM

## 2016-04-23 NOTE — Progress Notes (Signed)
Speech Language Pathology Daily Session Note  Patient Details  Name: [name] MRN: [id number] Date of Birth: 04/02/1955  Today's Date: 04/23/2016 SLP Individual Time: 1400-1430 SLP Individual Time Calculation (min): 30 min  Short Term Goals: Week 3: SLP Short Term Goal 1 (Week 3): Pt will demonstrate sustained attention to basic, familiar tasks for 5 minutes with Max A cues for redirection.  SLP Short Term Goal 2 (Week 3): Given choice of orientation information, pt will select correct answer with Max A multimodal cues.  SLP Short Term Goal 3 (Week 3): Pt will utilize speech intelligibility strategies with Max A verbal cues to achieve ~>75% intelligibility at the phrase level.  SLP Short Term Goal 4 (Week 3): Given Mod A multimodal cues, pt will complete functional 1 step basic, familiar directions.  SLP Short Term Goal 5 (Week 3): Pt will consume least restrictive diet without overt s/s of aspiration with Mod A cues for use of compensatory swallow strategies.   Skilled Therapeutic Interventions: Skilled treatment session focused on addressing dysphagia and cognition goals. SLP facilitated session with get to know you discussion with ability to state he had a stork that affected his right side, he required Max assist cues for other orientation information.  SLP set-up and provided Mod assist tactile cues to complete thorough oral care via suction toothbrush despite verbal cues SLP completed right-sided oral care.  SLP also facilitated session with teaspoons of ice chips which resulted in multiple swallows but no overt s/s of aspiration. Patient then consumed cup sips of thin liquids with Mod assist verbal and tactile cues for small portions and a slow pace, which initially effectively prevented overt s/s of aspiration; however, patient with reflexive cough response that was strong but appeared difficult to coordinate.  Patient will likely need a repeat objective assessment this week.  Continue  with current plan of care.    Function:  Eating Eating   Modified Consistency Diet: Yes Eating Assist Level: Supervision or verbal cues;Helper feeds patient;Help managing cup/glass           Cognition Comprehension Comprehension assist level: Understands basic 25 - 49% of the time/ requires cueing 50 - 75% of the time  Expression   Expression assist level: Expresses basic 25 - 49% of the time/requires cueing 50 - 75% of the time. Uses single words/gestures.  Social Interaction Social Interaction assist level: Interacts appropriately 25 - 49% of time - Needs frequent redirection.  Problem Solving Problem solving assist level: Solves basic less than 25% of the time - needs direction nearly all the time or does not effectively solve problems and may need a restraint for safety  Memory Memory assist level: Recognizes or recalls 25 - 49% of the time/requires cueing 50 - 75% of the time    Pain Pain Assessment Pain Assessment: No/denies pain  Therapy/Group: Individual Therapy  Carmelia Roller., [place] [id number]  [place] 04/23/2016, 5:03 PM

## 2016-04-24 ENCOUNTER — Inpatient Hospital Stay (HOSPITAL_COMMUNITY): Payer: Medicare Other

## 2016-04-24 ENCOUNTER — Inpatient Hospital Stay (HOSPITAL_COMMUNITY): Payer: Medicare Other | Admitting: Speech Pathology

## 2016-04-24 ENCOUNTER — Inpatient Hospital Stay (HOSPITAL_COMMUNITY): Payer: Medicare Other | Admitting: Occupational Therapy

## 2016-04-24 ENCOUNTER — Inpatient Hospital Stay (HOSPITAL_COMMUNITY): Payer: Medicare Other | Admitting: Physical Therapy

## 2016-04-24 DIAGNOSIS — W07XXXA Fall from chair, initial encounter: Secondary | ICD-10-CM | POA: Insufficient documentation

## 2016-04-24 DIAGNOSIS — I6350 Cerebral infarction due to unspecified occlusion or stenosis of unspecified cerebral artery: Secondary | ICD-10-CM

## 2016-04-24 DIAGNOSIS — I160 Hypertensive urgency: Secondary | ICD-10-CM

## 2016-04-24 DIAGNOSIS — I6340 Cerebral infarction due to embolism of unspecified cerebral artery: Secondary | ICD-10-CM

## 2016-04-24 DIAGNOSIS — I509 Heart failure, unspecified: Secondary | ICD-10-CM

## 2016-04-24 DIAGNOSIS — F919 Conduct disorder, unspecified: Secondary | ICD-10-CM

## 2016-04-24 LAB — GLUCOSE, CAPILLARY
GLUCOSE-CAPILLARY: 105 mg/dL — AB (ref 65–99)
GLUCOSE-CAPILLARY: 170 mg/dL — AB (ref 65–99)
GLUCOSE-CAPILLARY: 31 mg/dL — AB (ref 65–99)
GLUCOSE-CAPILLARY: 49 mg/dL — AB (ref 65–99)
GLUCOSE-CAPILLARY: 82 mg/dL (ref 65–99)
GLUCOSE-CAPILLARY: 90 mg/dL (ref 65–99)
Glucose-Capillary: 140 mg/dL — ABNORMAL HIGH (ref 65–99)
Glucose-Capillary: 25 mg/dL — CL (ref 65–99)

## 2016-04-24 LAB — URINALYSIS, ROUTINE W REFLEX MICROSCOPIC
BILIRUBIN URINE: NEGATIVE
GLUCOSE, UA: NEGATIVE mg/dL
HGB URINE DIPSTICK: NEGATIVE
KETONES UR: NEGATIVE mg/dL
Leukocytes, UA: NEGATIVE
Nitrite: NEGATIVE
PH: 5 (ref 5.0–8.0)
Protein, ur: NEGATIVE mg/dL
SPECIFIC GRAVITY, URINE: 1.008 (ref 1.005–1.030)

## 2016-04-24 MED ORDER — POTASSIUM CHLORIDE 20 MEQ/15ML (10%) PO SOLN
40.0000 meq | Freq: Every day | ORAL | Status: DC
  Filled 2016-04-24: qty 30

## 2016-04-24 NOTE — Progress Notes (Signed)
Occupational Therapy Discharge Summary  Patient Details  Name: [name] MRN: [id number] Date of Birth: 11/07/1955  Today's Date: 04/24/2016 OT Individual Time: 0800-0900 OT Individual Time Calculation (min): 60 min    Patient has met 7 of 13 long term goals due to improved activity tolerance, improved balance, improved ability to compensate for deficits, improved attention, improved awareness, improved postural control and functional use of  RIGHT upper extremity.  Patient to discharge at  [facility] Assist/max Assist wc level.  Patient's care partner unavailable to provide the necessary physical and cognitive assistance at discharge, therefore, pt will be discharge to skilled nursing facility for continued care.  Reasons goals not met: Patient continues to require Max A for standing balance and LB dressing. He requires Mod A for UB dressing, self-feeding, and attention 2/2 limited functional progress.  Recommendation:  Patient will benefit from ongoing skilled OT services in skilled nursing facility setting to continue to advance functional skills in the area of Reduce care partner burden.  Equipment: No equipment provided  Reasons for discharge: discharge from hospital  Patient/family agrees with progress made and goals achieved: Yes   Skilled OT treatment.   OT session focused on attention, awareness, B UE coordination, functional use of R Ue, siting balance, functional transfers and modified bathing/dressing.  Pt required max multimodal cues to maintain arousal and keep eyes open to participate in ADL. Pt confused and hallucinating- disoriented to place, time, situation, and unable to identify his daughter. At one point during session, pt stated "open your hands" and placed and imaginary object into OT's hand, when asked, pt stated "it's a key." Worked on integrated R UE into functional bathing/dressing/grooming.  Pt left in TIS wc at end of session with daughter present and needs met.    OT Discharge Precautions/Restrictions  Precautions Precautions: Fall Restrictions Weight Bearing Restrictions: No Pain Pain Assessment Pain Assessment: No/denies pain ADL ADL Equipment Provided: Feeding equipment Eating: Moderate assistance Grooming: Minimal assistance Where Assessed-Grooming: Wheelchair, Sitting at sink Upper Body Bathing: Minimal assistance Where Assessed-Upper Body Bathing: Sitting at sink, Wheelchair Lower Body Bathing: Moderate assistance Where Assessed-Lower Body Bathing: Wheelchair, Sitting at sink Upper Body Dressing: Moderate assistance Where Assessed-Upper Body Dressing: Sitting at sink, Wheelchair Lower Body Dressing: Maximal assistance Where Assessed-Lower Body Dressing: Sitting at sink, Wheelchair Toileting: Dependent Where Assessed-Toileting: Scientist, clinical (histocompatibility and immunogenetics): Moderate assistance Film/video editor Method: Engineer, mining with back, Grab bars ADL Comments: Please see functional navigator Vision/Perception  Perception Perception: Impaired Inattention/Neglect: Does not attend to right side of body  Cognition Overall Cognitive Status: Impaired/Different from baseline Arousal/Alertness: Awake/alert Orientation Level: Oriented to person Attention: Sustained Sustained Attention: Impaired Sustained Attention Impairment: Verbal basic;Functional basic Memory: Impaired Memory Impairment: Decreased recall of new information Awareness: Impaired Awareness Impairment: Anticipatory impairment Problem Solving: Impaired Problem Solving Impairment: Verbal basic;Functional basic Executive Function: Initiating;Self Monitoring;Self Correcting Initiating: Impaired Initiating Impairment: Verbal basic;Functional basic Self Monitoring: Impaired Self Monitoring Impairment: Verbal basic;Functional basic Self Correcting: Impaired Self Correcting Impairment: Verbal basic;Functional basic Safety/Judgment:  Impaired (impulsive) Sensation Sensation Light Touch: Impaired Detail Light Touch Impaired Details: Impaired RLE;Impaired LLE;Impaired RUE Proprioception: Impaired Detail Proprioception Impaired Details: Impaired RLE;Impaired RUE Coordination Gross Motor Movements are Fluid and Coordinated: No Fine Motor Movements are Fluid and Coordinated: No Coordination and Movement Description: R UE ataxia Finger Nose Finger Test: poor coordination of R UE Motor  Motor Motor: Ataxia;Abnormal postural alignment and control;Hemiplegia;Abnormal tone Mobility  Sit to Stand: 3: Mod assist;With upper extremity  assist Sit to Stand Details: Tactile cues for weight shifting;Verbal cues for sequencing;Verbal cues for technique;Manual facilitation for weight shifting;Manual facilitation for placement  Trunk/Postural Assessment  Postural Control Postural Control: Deficits on evaluation Trunk Control: impaired, variable due to lateral lean to R, posterior and at times anterior and to L  Balance Balance Balance Assessed: Yes Dynamic Sitting Balance Sitting balance - Comments: Able to statically sit briefly without UE support.  Static Standing Balance Static Standing - Balance Support: Right upper extremity supported;During functional activity Static Standing - Level of Assistance: 3: Mod assist;2: Max assist 2/2 progressive lateral lean to R Extremity/Trunk Assessment RUE Assessment RUE Assessment: Exceptions to [facility] (limited by ataxia) LUE Assessment LUE Assessment: Within Functional Limits   See Function Navigator for Current Functional Status.  [name] [name] 04/24/2016, 8:44 PM

## 2016-04-24 NOTE — Plan of Care (Signed)
Problem: RH BLADDER ELIMINATION Goal: RH STG MANAGE BLADDER WITH ASSISTANCE STG Manage Bladder With mod Assistance   Outcome: Not Progressing Total assist

## 2016-04-24 NOTE — Progress Notes (Signed)
Social Work Patient ID: DAR BAILE, male   DOB: March 22, 1955, 61 y.o.   MRN: [id number]  Spoke with [facility] who reports they will have a bed for pt tomorrow. Will touch base in the am to confirm bed. Contacted daughter-Nicole to inform her of this plan. This facility is families first choice. Have informed team and Pam-PA and MD. Confirm in am transfer to [facility]

## 2016-04-24 NOTE — Plan of Care (Signed)
Problem: RH Balance Goal: LTG Patient will maintain dynamic standing balance (PT) LTG:  Patient will maintain dynamic standing balance with assistance during mobility activities (PT)  Outcome: Not Met (add Reason) Patient requires max to +2A for dynamic standing balance  Problem: RH Bed Mobility Goal: LTG Patient will perform bed mobility with assist (PT) LTG: Patient will perform bed mobility with assistance, with/without cues (PT).  Outcome: Not Met (add Reason) Patient continues to require max A for bed mobility using hospital bed functions.  Problem: RH Wheelchair Mobility Goal: LTG Patient will propel w/c in controlled environment (PT) LTG: Patient will propel wheelchair in controlled environment, # of feet with assist (PT)  Outcome: Not Met (add Reason) Patient requires max A for WC propulsion using BLE.

## 2016-04-24 NOTE — Progress Notes (Signed)
*  PRELIMINARY RESULTS* Vascular Ultrasound Renal Artery Duplex has been completed.  Preliminary findings: no obvious renal artery stenosis bilaterally  [name] [name] 04/24/2016, 11:45 AM

## 2016-04-24 NOTE — Discharge Summary (Signed)
Physician Discharge Summary  Patient ID: [name] MRN: [place]:[id number] DOB/AGE: [birth year] 61 y.o.  Admit date: 04/03/2016 Discharge date: 04/25/2016  Discharge Diagnoses:  Principal Problem:   Embolic stroke involving posterior cerebral artery (HCC) Active Problems:   Diabetes type 2, uncontrolled ([place])   Hypertension   CHF (congestive heart failure) ([place])   Coronary artery disease involving native coronary artery of native heart without angina pectoris   Morbid obesity ([place])   Stage 2 chronic kidney disease   Hypokalemia   Acute blood loss anemia   UTI due to Klebsiella species   Urinary retention   Discharged Condition: stable  Significant Diagnostic Studies: Dg Shoulder Right  Result Date: 04/24/2016 CLINICAL DATA:  Pain after falling from chair. EXAM: RIGHT SHOULDER - 2+ VIEW COMPARISON:  None. FINDINGS: No acute fracture. No dislocations. Osteoarthritic joint space narrowing of the AC and glenohumeral joints. Soft tissue calcification adjacent to greater tuberosity may reflect rotator cuff tendinopathy. Mild tug related muscle insertional change noted of the proximal humerus. IMPRESSION: 1. Osteoarthritis of the [facility] and glenohumeral joints. 2. No acute fracture or bone destruction. 3. Calcific rotator cuff tendinopathy. Electronically Signed   By: Ashley Royalty M.D.   On: 04/24/2016 19:32   Dg Elbow 2 Views Right  Result Date: 04/24/2016 CLINICAL DATA:  Pain after fall from chair EXAM: RIGHT ELBOW - 2 VIEW COMPARISON:  None. FINDINGS: Soft tissue swelling and induration posterior to the elbow joint without acute fracture or malalignment. No definite joint effusion. The lateral view is slightly oblique limiting assessment for joint effusions. IMPRESSION: Negative for acute fracture or malalignment. Dorsal soft tissue swelling of the distal arm and proximal forearm. Electronically Signed   By: Ashley Royalty M.D.   On: 04/24/2016 19:36   Ct Head Wo Contrast  Result Date:  04/24/2016 CLINICAL DATA:  Patient fell from chair.  Unwitnessed fall. EXAM: CT HEAD WITHOUT CONTRAST TECHNIQUE: Contiguous axial images were obtained from the base of the skull through the vertex without intravenous contrast. COMPARISON:  04/19/2016 CT FINDINGS: BRAIN: There is sulcal and ventricular prominence consistent with superficial and central atrophy. No intraparenchymal hemorrhage, mass effect nor midline shift. Periventricular and subcortical white matter hypodensities consistent with a moderate degree of chronic small vessel ischemic disease are identified. Chronic left pontine and bilateral basal ganglial lacunar infarcts. No acute large vascular territory infarcts. No abnormal extra-axial fluid collections. Basal cisterns are not effaced and midline. VASCULAR: Moderate calcific atherosclerosis of the carotid siphons. SKULL: No skull fracture. No significant scalp soft tissue swelling. SINUSES/ORBITS: The mastoid air-cells are clear. The included paranasal sinuses are well-aerated.The included ocular globes and orbital contents are non-suspicious. Bilateral lens replacements surgeries. OTHER: None. IMPRESSION: Chronic stable moderate small vessel ischemic disease of periventricular and subcortical white matter. Chronic left pontine and bilateral basal ganglial lacunar infarcts. No acute intracranial abnormality nor fracture. Electronically Signed   By: Ashley Royalty M.D.   On: 04/24/2016 19:39   Ct Head Wo Contrast  Result Date: 04/19/2016 CLINICAL DATA:  61 y/o M; history of brainstem stroke for follow-up. EXAM: CT HEAD WITHOUT CONTRAST TECHNIQUE: Contiguous axial images were obtained from the base of the skull through the vertex without intravenous contrast. COMPARISON:  03/30/2016 MRI of the head. FINDINGS: Brain: No evidence of acute infarction, hemorrhage, hydrocephalus, extra-axial collection or mass lesion/mass effect. Foci of hypoattenuation in subcortical and periventricular white matter are  stable and compatible with moderate chronic microvascular ischemic changes. Stable mild brain parenchymal volume loss. Stable chronic lacunar  infarcts in the basal ganglia and left hemi pons. Slight increased hypoattenuation within the left hemi pons probably corresponds to infarct on prior MRI. Vascular: Calcific atherosclerosis of cavernous and paraclinoid internal carotid arteries. Skull: Normal. Negative for fracture or focal lesion. Sinuses/Orbits: No acute finding. Small stable calcific focus within the left lateral globe and abnormal vitreous signal on prior MRIs likely representing chronic retinal detachment. Other: None. IMPRESSION: 1. No new acute intracranial abnormality. 2. Stable moderate chronic microvascular ischemic changes and mild brain parenchymal volume loss. Crown lacunar infarcts and pons and basal ganglia. 3. Slight increased hypoattenuation within left hemi pons probably corresponding to infarct on prior MRI. Electronically Signed   By: Kristine Garbe M.D.   On: 04/19/2016 14:26   Dg Hip Unilat With Pelvis 2-3 Views Right  Result Date: 04/24/2016 CLINICAL DATA:  Right hip pain EXAM: DG HIP (WITH OR WITHOUT PELVIS) 2-3V RIGHT COMPARISON:  None. FINDINGS: There is no evidence of hip fracture or dislocation. Hip joints are maintained bilaterally without significant joint space narrowing. The bony pelvis appears intact. There is sclerosis along the expected location of the L5-S1 facets consistent with lumbar facet arthropathy. SI joints and pubic symphysis are maintained. Ileofemoral and branch vessel vascular calcifications are noted bilaterally. IMPRESSION: No acute osseous abnormality identified of the bony pelvis and either hip. L5-S1 facet arthropathy. Vascular calcifications as above described. Electronically Signed   By: Ashley Royalty M.D.   On: 04/24/2016 19:35   Labs:  Basic Metabolic Panel:  Recent Labs Lab 04/23/16 1151 04/25/16 0357  NA 146* 146*  K 4.3 3.5  CL  109 109  CO2 23 26  GLUCOSE 309* 180*  BUN 32* 23*  CREATININE 1.46* 1.33*  CALCIUM 9.8 9.4    CBC:  Recent Labs Lab 04/23/16 1151  WBC 6.7  NEUTROABS 4.7  HGB 13.7  HCT 43.7  MCV 92.4  PLT 237    CBG:  Recent Labs Lab 04/24/16 2120 04/24/16 2140 04/25/16 0002 04/25/16 0647 04/25/16 1132  GLUCAP 56* 82 105* 181* 210*    Brief HPI:   [name] is a 61 year old male with history of CAD, with ICM, diastolic CHF, [id number], peripheral neuropathy, DVT- on Xarelto who was admitted on 03/28/16 with right sided numbness. BP elevated in ED and patient with ST changes in inferolateral leads. UDS negative. 2 D echo done revealing mild LVH with global hypokinesis with akinesis of inferolateral wall and EF 25- 30% with grade 1. Dr. Warren Lacy recommended outpatient perfusion study and Bidil and coreg added for management of cardiomyopathy.MRI brain done revealing old bilateral thalamic infarcts, bilateral basal ganglia lacunar infarcts, multiple old pontine and cerebellar infarcts.MRA head/neck done revealing severe stenosis B-ICA with multifocal moderate stenosis posterior circulation and occluded R-VA without significant reconstitution. MRI cervical spine showed cervical spondylosis with discogenic and facet degenerative changes with right central disc protrusion C3-C4. Neurology consulted for input on severe bilateral peripheral neuropathy with absent sensation to knees.Repeat MRI brain done revealing 9 mm acute ischemic infarct within left paramedian pons.Therapy evaluations done revealing right sided weakness, chronic visual deficits, unsteady gait with inability to advance RLE and ataxic gait. CIR recommended for follow up therapy.    Hospital Course: [name] was admitted to rehab 04/03/2016 for inpatient therapies to consist of PT, ST and OT at least three hours five days a week. Past admission physiatrist, therapy team and rehab RN have worked together to provide customized  collaborative inpatient rehab. He was maintained on  Xarelto for secondary stroke prevention and H/H has been stable on serial checks. His po intake has been poor and he has required encouragement and assistance to help improve intake. Blood sugars were monitored on ac/hs basis and have been difficult to control due to variable intake. His diet was downgraded to dysphagia 2, nectar liquids and he required nocturnal IVF for gentle hydration due to acute on chronic renal failure.  Fluids were discontinued on 12/20 and renal status is slowing improving and hypokalemia has resolved. He continues to have hypernatremia likely due to tid Glucerna supplements. Recommend changing supplement to nephro and following lytes closely for resolution. Respiratory status has been stable and no signs of fluid overload noted.   Blood pressures continued to be labile and medications have been titrated upwards for better control. Renal ultrasound done on 2/27 and showed no evidence of RAS. No reports of cardiac discomfort or SOB with increase in activity level.  He was found to have Pseudomonas/Enterococcus UTI and has been treated with 7 day course of cipro.  He has had issues with urinary retention requiring in and out catheterization every 6-8 hours with coude catheter to keep bladder volumes <350 cc. He is continent of bowel.  He has had issues with fluctuating mental status in part due to depressed mood with poor motivation. He was started on prozac for activation and remeron to help with mood as well as po intake.  Follow up CT head done 2/22 showed stable moderate chronic microvascular ischemic changes and no new acute intracranial abnormality.   He is continued to have issues with decreased initiation and participation therefore amantadine was added with minimal effects. Ritalin was added for trial but he developed agitation and confusion on this therefore this was discontinued. He did sustain a fall on 2/27 due to attempt to  get out of chair to his bed. CT head and X rays of pelvis, hip, shoulder and elbow are negative for fracture or acute changes. He denies any pain and is at baseline neurologically. Repeat UA 2/27 is negative and culture currently pending. MBS was repeated today and shows decreased in oral manipulation of all consistency and to continue dysphagia 2, nectar liquids. His overall progress has been limited by significant impairments due to decreased visual acuity, ataxia, right inattention with sensory deficits as well as cognitive deficits. Family is unable to provide care extensive amount of assistance that is needed and has elected on follow up therapy at [facility].    Rehab course: During patient's stay in rehab weekly team conferences were held to monitor patient's progress, set goals and discuss barriers to discharge. At admission, patient required total assist with basic self care tasks and for mobility. He exhibited moderate cognitive deficits affecting recall, delayed processing, difficulty with basic problem solving, requiring max cues for sustained attention and speech intelligibility. Testing revealed  MoCA score 13/22.  Patient has had improvement in activity tolerance, balance, postural control, as well as functional use of RUE/RLE.  He is able to complete bathing and upper body dressing with min assist. He requires max assist of LB dressing.   He requires max to total assist for bed mobility, standing balance and wheelchair mobility due to limited functional progress. He is able to ambulate 30 feet with max assist one rail. He is able to express needs with mod to max assist using single words and gestures. He requires max to total assist with cues for basic tasks.     Disposition: Skilled  Nursing Facility  Diet: Dysphagia 2, nectar liquids.   Special Instructions: 1. Recheck BMET on 3/2 2. Monitor BS ac/hs and use SSI for elevated BS.  3. Offer nectar thick nephro supplements with  meals to help maintain hydration.  4. Needs supervision and assistance with meals. No Straws. Medications crushed in puree.    Discharge Instructions    Ambulatory referral to Physical Medicine Rehab    Complete by:  As directed    3-4 week  follow up--going to [facility]     Allergies as of 04/25/2016      Reactions   Iohexol Hives   Ivp Dye [iodinated Diagnostic Agents] Hives      Medication List    STOP taking these medications   triamcinolone ointment 0.1 % Commonly known as:  KENALOG     TAKE these medications   acetaminophen 160 MG/5ML solution Commonly known as:  TYLENOL Take 10.2-20.3 mLs (325-650 mg total) by mouth every 4 (four) hours as needed for mild pain.   amLODipine 10 MG tablet Commonly known as:  NORVASC Take 1 tablet (10 mg total) by mouth daily.   atorvastatin 40 MG tablet Commonly known as:  LIPITOR Take 1 tablet (40 mg total) by mouth daily at 6 PM.   bethanechol 10 MG tablet Commonly known as:  URECHOLINE Take 1 tablet (10 mg total) by mouth 3 (three) times daily.   carvedilol 25 MG tablet Commonly known as:  COREG Take 1 tablet (25 mg total) by mouth 2 (two) times daily with a meal.   chlorthalidone 25 MG tablet Commonly known as:  HYGROTON Take 3 tablets (75 mg total) by mouth daily. What changed:  how much to take   cloNIDine 0.3 MG tablet Commonly known as:  CATAPRES Take 1 tablet (0.3 mg total) by mouth 3 (three) times daily.   FLUoxetine 10 MG capsule Commonly known as:  PROZAC Take 1 capsule (10 mg total) by mouth daily.   furosemide 80 MG tablet Commonly known as:  LASIX Take 80 mg by mouth daily.   hydrocerin Crea Apply 1 application topically 2 (two) times daily.   hydrocortisone 2.5 % rectal cream Commonly known as:  ANUSOL-HC Place rectally 2 (two) times daily as needed for hemorrhoids or itching.   insulin aspart 100 UNIT/ML injection Commonly known as:  novoLOG Inject 8 Units into the skin 3 (three) times  daily with meals.   insulin aspart protamine- aspart (70-30) 100 UNIT/ML injection Commonly known as:  NOVOLOG MIX 70/30 Inject 0.3 mLs (30 Units total) into the skin daily with supper. What changed:  how much to take  when to take this   insulin aspart protamine- aspart (70-30) 100 UNIT/ML injection Commonly known as:  NOVOLOG MIX 70/30 Inject 0.45 mLs (45 Units total) into the skin daily with breakfast. What changed:  how much to take  when to take this   isosorbide-hydrALAZINE 20-37.5 MG tablet Commonly known as:  BIDIL Take 2 tablets by mouth 3 (three) times daily.   lidocaine 2 % jelly Commonly known as:  XYLOCAINE Apply topically as needed (Use with in and out catheter).   losartan 100 MG tablet Commonly known as:  COZAAR Take 1 tablet (100 mg total) by mouth daily.   mirtazapine 7.5 MG tablet Commonly known as:  REMERON Take 1 tablet (7.5 mg total) by mouth at bedtime.   polyethylene glycol packet Commonly known as:  MIRALAX / GLYCOLAX Take 17 g by mouth daily.   potassium  chloride 20 MEQ/15ML (10%) Soln Take 30 mLs (40 mEq total) by mouth 2 (two) times daily.   rivaroxaban 20 MG Tabs tablet Commonly known as:  XARELTO Take 1 tablet (20 mg total) by mouth daily with supper. What changed:  when to take this   senna-docusate 8.6-50 MG tablet Commonly known as:  Senokot-S Take 1 tablet by mouth daily.   tamsulosin 0.4 MG Caps capsule Commonly known as:  FLOMAX Take 1 capsule (0.4 mg total) by mouth daily after supper.      Follow-up Information    Ankit Lorie Phenix, MD Follow up.   Specialty:  Physical Medicine and Rehabilitation Why:  office will call you with follow up appointment Contact information: [address] [place] [address] [phone]        Minus Breeding, MD. Schedule an appointment as soon as possible for a visit.   Specialty:  Cardiology Why:  to set up perfusion study for work up.  Contact information: [place] [address] [phone]        [facility]. Call.   Why:  for appointment in 3-4 weeks.  Contact information: [address]     [place] [phone]          Signed: Tajir, Nesbitt 04/25/2016, 12:01 PM

## 2016-04-24 NOTE — Progress Notes (Signed)
Hypoglycemic Event  CBG: 31  Treatment: 15 GM carbohydrate snack  Symptoms: None  Follow-up CBG: Time:2120 CBG Result:82  Possible Reasons for Event: Inadequate meal intake  Comments/MD notified:Pam Musson (PA)    [name]  [name]

## 2016-04-24 NOTE — Progress Notes (Signed)
Late entry  Brief note:   Please see PA note.  Patient noted to have fall, workup ordered, including head CT.

## 2016-04-24 NOTE — Progress Notes (Signed)
Patient found on the floor per nursing. They heard a noise and found patient lying on the floor on his right. He stated to nurse that he was tired and wanted to get to bed. On exam, he denies any pain.  No pain or discomfort with PROM right shoulder, elbow, wrist, hip, knee or foot. He is able to move LUE/LLE to command without pain.  Will get shoulder and hip films to rule out abnormality.

## 2016-04-24 NOTE — Progress Notes (Signed)
Physical Therapy Discharge Summary  Patient Details  Name: [name] MRN: [id number] Date of Birth: 01-04-1956  Today's Date: 04/24/2016 PT Individual Time: 1107-1200 PT Individual Time Calculation (min): 53 min   Patient has met 2 of 5 long term goals due to improved activity tolerance, improved balance, improved postural control, decreased pain, ability to compensate for deficits, functional use of  right upper extremity and right lower extremity, improved attention and improved awareness.  Patient to discharge at a wheelchair level Max Assist. Patient's care partner unavailable to provide the necessary physical and cognitive assistance at discharge, therefore discharge planned to SNF.  Reasons goals not met: Patient continues to require max to total A for standing balance, bed mobility, and wheelchair mobility due to limited functional progress.    Recommendation:  Patient will benefit from ongoing skilled PT services in skilled nursing facility setting to continue to advance safe functional mobility, address ongoing impairments in R paraparesis, muscle weakness, muscle joint tightness, decreased cardiorespiratory endurance, impaired timing and sequencing, abnormal tone, unbalanced muscle activation, ataxia, decreased coordination, decreased motor planning, decreased visual acuity, decreased midline orientation, decreased attention to right, decreased initiation, decreased attention, decreased awareness, decreased problem solving, decreased safety awareness, decreased memory and delayed processing and decreased sitting balance, decreased standing balance, decreased postural control and decreased balance strategies and minimize fall risk.  Equipment: No equipment provided  Reasons for discharge: discharge from hospital  Patient/family agrees with progress made and goals achieved: Yes  PT Discharge Precautions/Restrictions Precautions Precautions: Fall Restrictions Weight Bearing  Restrictions: No Pain Pain Assessment Pain Assessment: No/denies pain Vision/Perception   Defer to OT discharge summary  Cognition  Defer to SLP summary Sensation Sensation Light Touch: Impaired Detail Light Touch Impaired Details: Impaired RLE;Impaired LLE;Impaired RUE Proprioception: Impaired Detail Proprioception Impaired Details: Impaired RLE;Impaired RUE Coordination Gross Motor Movements are Fluid and Coordinated: No Fine Motor Movements are Fluid and Coordinated: No Motor  Motor Motor: Ataxia;Abnormal postural alignment and control;Hemiplegia;Abnormal tone  Mobility Bed Mobility Bed Mobility: Rolling Right;Rolling Left;Supine to Sit;Sit to Supine Rolling Right: With rail;2: Max assist Rolling Left: 2: Max assist;With rail Supine to Sit: 2: Max assist;With rails;HOB elevated Sit to Supine: 2: Max assist;With rail Transfers Transfers: Yes Sit to Stand: 3: Mod assist;With upper extremity assist Squat Pivot Transfers: 3: Mod assist;With upper extremity assistance;With armrests Locomotion  Ambulation Ambulation: Yes Ambulation/Gait Assistance: 2: Max assist Ambulation Distance (Feet): 30 Feet Assistive device: Other (Comment) (rail in hallway) Gait Gait: Yes Gait Pattern: Impaired Gait Pattern: Step-to pattern;Step-through pattern;Decreased step length - left;Decreased stride length;Decreased hip/knee flexion - right;Decreased hip/knee flexion - left;Decreased dorsiflexion - right;Decreased weight shift to left;Ataxic;Lateral hip instability;Lateral trunk lean to right;Decreased trunk rotation;Trunk rotated posteriorly on left;Trunk flexed;Poor foot clearance - right Gait velocity: significantly reduced Stairs / Additional Locomotion Stairs: Yes Stairs Assistance: 2: Max assist Stair Management Technique: Two rails;Step to pattern;Forwards;Backwards Number of Stairs: 1 Height of Stairs: 3 Wheelchair Mobility Wheelchair Mobility: No  Trunk/Postural Assessment   Cervical Assessment Cervical Assessment: Exceptions to [facility] (forward head, laterally flexed to L) Thoracic Assessment Thoracic Assessment: Exceptions to [facility] (kyphotic) Lumbar Assessment Lumbar Assessment: Exceptions to [facility] (posterior pelvic tilt) Postural Control Postural Control: Deficits on evaluation Trunk Control: impaired, variable due to lateral lean to R, posterior and at times anterior and to L Righting Reactions: impaired/insufficient  Balance Balance Balance Assessed: Yes Static Sitting Balance Static Sitting - Balance Support: Feet supported;Bilateral upper extremity supported Static Sitting - Level of Assistance: 5: Stand by assistance Static  Standing Balance Static Standing - Balance Support: Right upper extremity supported;During functional activity Static Standing - Level of Assistance: 2: Max assist;3: Mod assist Static Standing - Comment/# of Minutes: due to progressive lateral lean to R Extremity Assessment  RLE Assessment RLE Assessment: Exceptions to WFL RLE Strength RLE Overall Strength: Deficits;Due to impaired cognition RLE Overall Strength Comments: difficult to formally assess, grossly 3+/5 throughout, unable to sustain LLE Assessment LLE Assessment: Within Functional Limits   See Function Navigator for Current Functional Status.  ,  A 04/24/2016, 2:16 PM  

## 2016-04-24 NOTE — Progress Notes (Signed)
04/24/16 1600  What Happened  Was fall witnessed? No  Was patient injured? No  Patient found on floor  Found by Staff-comment [facility] RN, Jasmine NT)  Stated prior activity to/from bed, chair, or stretcher  Follow Up  MD notified Algis Liming PA  Time MD notified 110  Family notified Yes-comment (Daughter Velna Hatchet notified)  Time family notified [phone]  Additional tests Yes-comment  Simple treatment Dressing (Dressing applied to RLE for small skin tear)  Progress note created (see row info) Yes  Adult Fall Risk Assessment  Risk Factor Category (scoring not indicated) Fall has occurred during this admission (document High fall risk)  Patient's Fall Risk High Fall Risk (>13 points)  Adult Fall Risk Interventions  Required Bundle Interventions *See Row Information* High fall risk - low, moderate, and high requirements implemented  Additional Interventions Individualized elimination schedule;Lap belt while in chair/wheelchair;Room near nurses station  Screening for Fall Injury Risk  Risk For Fall Injury- See Row Information  Nurse judgement  Injury Prevention Interventions Family supervision  Vitals  Temp 97.5 F (36.4 C)  Temp Source Oral  BP 134/73  BP Location Right Arm  BP Method Automatic  Patient Position (if appropriate) Sitting  Pulse Rate 70  Pulse Rate Source Dinamap  Resp 16  Oxygen Therapy  SpO2 99 %  O2 Device Room Air  Pain Assessment  Pain Assessment 0-10  Pain Score 4  Pain Type Acute pain  Pain Location Generalized  Pain Descriptors / Indicators Aching  Pain Intervention(s) Repositioned;Emotional support  PCA/Epidural/Spinal Assessment  Respiratory Pattern Regular  Neurological  Neuro (WDL) X  Level of Consciousness Alert  Orientation Level Oriented to person  Cognition Follows commands;Poor attention/concentration;Poor safety awareness  Speech Delayed responses  Pupil Assessment  No  R Hand Grip Moderate  L Hand Grip Moderate   RUE Motor Response  Purposeful movement  RUE Sensation Full sensation  RUE Motor Strength 4  LUE Motor Response Purposeful movement  LUE Sensation Full sensation  LUE Motor Strength 5  RLE Motor Response Purposeful movement  RLE Sensation Decreased  RLE Motor Strength 4  LLE Motor Response Purposeful movement  LLE Sensation Decreased  LLE Motor Strength 4  Neuro Symptoms Depression  Neuro symptoms relieved by Rest  Musculoskeletal  Musculoskeletal (WDL) X  Assistive Device Wheelchair  Generalized Weakness Yes  Weight Bearing Restrictions No  Musculoskeletal Details  RUE Limited movement  RLE Limited movement  RLE Ortho/Supportive Device Ortho Boot  LLE Ortho/Supportive Device  LLE Ortho/Supportive Device Ortho Boot  Integumentary  Integumentary (WDL) X  Skin Color Appropriate for ethnicity  Skin Condition Dry;Flaky  Skin Integrity Other (Comment);Skin tear  Abrasion Location Buttocks  Abrasion Location Orientation Right;Left  Abrasion Intervention Barrier cream  Skin Tear Location Leg  Skin Tear Location Orientation Right  Skin Tear Intervention Foam  Skin Turgor Non-tenting     RN and NT reported to patient room around 1515 when a loud noise was heard at the nurses station. RN arrived to patient room to find pt lying in front of wheelchair with head off the ground. Pt stated that he was attempting to put himself in bed. Pink safety belt was in place at the time of fall and was still intact around wheelchair. Pt complaining of generalized pain and was assessed by PA. Pt obtained a small skin tear to right shin that was treated by betadine and a foam dressing. Vital signs are stable and pt transferred to bed. Call bell is in reach. Continue to  monitor.

## 2016-04-24 NOTE — Plan of Care (Signed)
Problem: RH Balance Goal: LTG Patient will maintain dynamic standing with ADLs (OT) LTG:  Patient will maintain dynamic standing balance with assist during activities of daily living (OT)   Outcome: Not Met (add Reason) Pt requires Max A +2 to maintain dynamic standing balance 2/2 limited functional progress- ESD 2/27  Problem: RH Eating Goal: LTG Patient will perform eating w/assist, cues/equip (OT) LTG: Patient will perform eating with assist, with/without cues using equipment (OT)  Outcome: Not Met (add Reason) Pt is inconsistent with self-feeding, requiring Min to Max A 2/2 depending on arousal level- ESD 2/27  Problem: RH Dressing Goal: LTG Patient will perform upper body dressing (OT) LTG Patient will perform upper body dressing with assist, with/without cues (OT).  Outcome: Not Met (add Reason) Pt inconsistent with UB dressing, requiring Moderate assistance 2/2 ataxia, low arousal, and limited functional progress -ESD 2/27 Goal: LTG Patient will perform lower body dressing w/assist (OT) LTG: Patient will perform lower body dressing with assist, with/without cues in positioning using equipment (OT)  Outcome: Not Met (add Reason) Pt requires Max A for LB dressing 2/2 ataxia, poor arousal and limited functional progress- ESD 2/27  Problem: RH Toileting Goal: LTG Patient will perform toileting w/assist, cues/equip (OT) LTG: Patient will perform toiletiing (clothes management/hygiene) with assist, with/without cues using equipment (OT)  Outcome: Not Met (add Reason) Pt requires total A for toileting 2/2 limited functional progress- ESD 2/27  Problem: RH Attention Goal: LTG Patient will demonstrate focused/sustained (OT) LTG:  Patient will demonstrate focused/sustained/selective/alternating/divided attention during functional activities in specific environment with assist for # of minutes  (OT)  Outcome: Not Met (add Reason) Pt requires Mod/Max A to maintain attention during  functional activity- ESD 2/27

## 2016-04-24 NOTE — Progress Notes (Signed)
[place] PHYSICAL MEDICINE & REHABILITATION     PROGRESS NOTE  Subjective/Complaints:  Pt seen sitting up in bed this AM.  Daughter at bedside states pt slept well overnight.  She has questions about NPO status. Pt working with OT.  Pt cognition and as a result functional ability wax/wane.   ROS: Denies CP, SOB, N/V/D, limited reliability.  Objective: Vital Signs: Blood pressure (!) 158/61, pulse 60, temperature 98.2 F (36.8 C), temperature source Oral, resp. rate 20, height 5\' 9"  (1.753 m), weight 112.5 kg (248 lb), SpO2 98 %. No results found.  Recent Labs  04/23/16 1151  WBC 6.7  HGB 13.7  HCT 43.7  PLT 237    Recent Labs  04/23/16 1151  NA 146*  K 4.3  CL 109  GLUCOSE 309*  BUN 32*  CREATININE 1.46*  CALCIUM 9.8   CBG (last 3)   Recent Labs  04/23/16 1633 04/23/16 2033 04/24/16 0637  GLUCAP 263* 140* 90    Wt Readings from Last 3 Encounters:  04/24/16 112.5 kg (248 lb)  03/28/16 111.1 kg (245 lb)  09/04/13 120.2 kg (265 lb)    Physical Exam:  BP (!) 158/61 (BP Location: Left Arm)   Pulse 60   Temp 98.2 F (36.8 C) (Oral)   Resp 20   Ht 5\' 9"  (1.753 m)   Wt 112.5 kg (248 lb)   SpO2 98%   BMI 36.62 kg/m  Constitutional: He appears well-developed. Obese. HENT: Normocephalic and atraumatic.  Eyes: EOM are normal. No discharge.  Cardiovascular: RRR. No JVD. Respiratory: Effort normal and breath sounds normal.  GI: Soft. Bowel sounds are normal.  Musculoskeletal: He exhibits no edema. He exhibits no tenderness.  Neurological: Keeps eyes closed. Dysarthric  Limited vision.  Poor insight and awareness.    Motor: 4+/5 RUE prox to distal. Ataxia +/- dysmetria (stable, confounded by vision).  RLE: 4/+/5 proximal to distal  LLE 4+ /5 proximal to distal.  LUE/LLE proximal to distal 5/5.  Skin: Warm and dry. B/l DTIs.  Psychiatric: Flat affect. Limited initiation.   Assessment/Plan: 1. Functional deficits secondary to left paramedian pontine  infarct which require 3+ hours per day of interdisciplinary therapy in a comprehensive inpatient rehab setting. Physiatrist is providing close team supervision and 24 hour management of active medical problems listed below. Physiatrist and rehab team continue to assess barriers to discharge/monitor patient progress toward functional and medical goals.  Function:  Bathing Bathing position   Position: Wheelchair/chair at sink  Bathing parts Body parts bathed by patient: Right arm, Left arm, Chest, Abdomen, Right upper leg, Left upper leg, Front perineal area Body parts bathed by helper: Right lower leg, Left lower leg, Buttocks  Bathing assist Assist Level: Touching or steadying assistance(Pt > 75%)      Upper Body Dressing/Undressing Upper body dressing   What is the patient wearing?: Pull over shirt/dress     Pull over shirt/dress - Perfomed by patient: Thread/unthread left sleeve Pull over shirt/dress - Perfomed by helper: Put head through opening, Pull shirt over trunk, Thread/unthread right sleeve Button up shirt - Perfomed by patient: Thread/unthread left sleeve, Thread/unthread right sleeve Button up shirt - Perfomed by helper: Pull shirt around back, Button/unbutton shirt    Upper body assist Assist Level:  (Max A)      Lower Body Dressing/Undressing Lower body dressing   What is the patient wearing?: Pants, Socks     Pants- Performed by patient: Thread/unthread left pants leg Pants- Performed by helper: Pull  pants up/down, Thread/unthread right pants leg   Non-skid slipper socks- Performed by helper: Don/doff right sock, Don/doff left sock       Shoes - Performed by helper: Don/doff right shoe, Don/doff left shoe, Fasten right, Fasten left   AFO - Performed by helper: Don/doff right AFO, Don/doff left AFO (ACE wraps)   TED Hose - Performed by helper: Don/doff left TED hose, Don/doff right TED hose  Lower body assist Assist for lower body dressing: Touching or  steadying assistance (Pt > 75%)      Toileting Toileting Toileting activity did not occur: No continent bowel/bladder event   Toileting steps completed by helper: Adjust clothing prior to toileting, Performs perineal hygiene, Adjust clothing after toileting Toileting Assistive Devices: Other (comment) (steady )  Toileting assist Assist level: Two helpers   Transfers Chair/bed transfer   Chair/bed transfer method: Squat pivot Chair/bed transfer assist level: Moderate assist (Pt 50 - 74%/lift or lower) Chair/bed transfer assistive device: Armrests Mechanical lift: Ecologist     Max distance: 10' Assist level: 2 helpers   Wheelchair   Type: Manual Max wheelchair distance: 50' Assist Level: Maximal assistance (Pt 25 - 49%) (BLE)  Cognition Comprehension Comprehension assist level: Understands basic 50 - 74% of the time/ requires cueing 25 - 49% of the time  Expression Expression assist level: Expresses basic 25 - 49% of the time/requires cueing 50 - 75% of the time. Uses single words/gestures.  Social Interaction Social Interaction assist level: Interacts appropriately 25 - 49% of time - Needs frequent redirection.  Problem Solving Problem solving assist level: Solves basic 25 - 49% of the time - needs direction more than half the time to initiate, plan or complete simple activities  Memory Memory assist level: Recognizes or recalls 25 - 49% of the time/requires cueing 50 - 75% of the time    Medical Problem List and Plan: 1.  Right hemiparesis and functional deficits secondary to left paramedian pontine infarct on 1/31  Cont CIR, likely d/c tomorrow to SNF  Baclofen started for spasms d/ced on 2/14 due to lethargy with some improvement  Repeat CT head reviewed 2/22, no acute changes 2. H/o DVT /Anticoagulation: Pharmaceutical: Xarelto 3. Pain Management:   Lidoderm patch started due to therapy reports of pain - patient states improved, however, ?reliability.   4. Mood: LCSW to follow for evaluation and support.   Fluoxetine started 2/15  Amantadine started on 2/19, with ?improvement  Trial Ritalin started 2/22 5. Neuropsych: This patient is not yet capable of making decisions on his own behalf.  Appreciate Neuropsych eval 6. Skin/Wound Care: Routine pressure relief measures. Eucerin to b/l LE.  Prevalon boots ordered for DTI 7. Fluids/Electrolytes/Nutrition: Monitor I/Os  Downgraded to D2, Nectar  Intake has been variable affecting CBGs  Remeron added 2/15, with improvement 8. HTN: Monitor BP.   Continue coreg, cozaar, Bidil and lasix.     Chlorthalidone increased on 2/8, again on 2/12, increased again on 2/15  Norvasc 5 started 2/22, increased on 2/26  Improving  Renal artery ultrasound pending, NPO for procedure 9. T2DM with retinopathy and neuropathy: Blood sugars poorly controlled with A1C- 11.0. Consult RD to educated on [place]. Monitor BS ac/hs.   Novolog 70/30 30 AM, increased to 35U on 2/22, increased to 45U on 2/26  Novolog 70/30 30 PM  Novalog decreased to 2U TID, increased to 5U on 2/21  qHS snack added 2/14  Continues to be variable based on intake 10. CAD/  Acute systolic CHF/NICM: compensated. Check daily weights and monitor for signs of overload. Heart healthy/low salt diet. Will need perfusion study on outpatient basis per cardiology. Cont meds Filed Weights   04/22/16 0511 04/23/16 0433 04/24/16 0425  Weight: 115.2 kg (254 lb) 114.9 kg (253 lb 4.9 oz) 112.5 kg (248 lb)   Clinically stable 11. Morbid Obesity: BMI - 36. Encourage appropriate diet, weight loss and exercise.  12. CKD:   Cr. 1.46 on 2/26 (around baseline)  Encourage fluids  IVF qhs at 50cc/hr, d/ced 2/20 13. Hypokalemia:   Increased K+ supplement 2/9  K+ 4.3 on 2/26  Continue supplement 14. ABLA: Resolved  Cont to monitor 15. Acute lower UTI  UA+, Ucx with Pseudomonas and enterococcus  Spoke with pharmacy, macrobid changed to Cipro, completed  2/12-2/18  LOS (Days) 21 A FACE TO FACE EVALUATION WAS PERFORMED  [name] [name] 04/24/2016 8:53 AM

## 2016-04-24 NOTE — Progress Notes (Signed)
Occupational Therapy Session Note  Patient Details  Name: [name] MRN: [id number] Date of Birth: 06-Apr-1955  Today's Date: 04/24/2016 OT Individual Time: 1345-1415 OT Individual Time Calculation (min): 30 min    Short Term Goals: Week 2:  OT Short Term Goal 1 (Week 2): Pt will complete LB dressing with max assist of one caregiver OT Short Term Goal 1 - Progress (Week 2): Met OT Short Term Goal 2 (Week 2): Pt will complete sit > stand with mod assist to decrease burden of care with LB dressing/hygiene OT Short Term Goal 2 - Progress (Week 2): Not met OT Short Term Goal 3 (Week 2): Pt will complete functional transfer with LRAD to [facility] with Mod A of 1 to decrease caregiver burden OT Short Term Goal 3 - Progress (Week 2): Not met  Skilled Therapeutic Interventions/Progress Updates:    Pt seated in tilt in space w/c upon arrival reporting no pain and agreeable to tx. Pt requests to doff socks and put on shoes. Pt with LUE placed in seated figure four pt able to doff sock. Pt unable to bring RLE into seated figure four d/t hip tightness. With OT steadying shoe, pt able to align toes of B feet into shoe and with A pt pushes heel into shoe. Pt requests water, and OT provides thickened liquids and educates pt on importance of thickhened liquids d/t decreased swallowing ability. Pt sit to stand at sink for visual feedback with MAX A and VC for safety awareness, sequencing initiation and weight shifting to midline when standing. Pt requires seated rest break between standing trials. Pt left in room seated in w/c with QRB in place and call light in reach.  Therapy Documentation Precautions:  Precautions Precautions: Fall Restrictions Weight Bearing Restrictions: No General: General PT Missed Treatment Reason: CT/MRI (ultrasound) Vital Signs:  Pain: Pain Assessment Pain Assessment: No/denies pain ADL: ADL ADL Comments: Please see functional navigator Exercises:   Other Treatments:     See Function Navigator for Current Functional Status.   Therapy/Group: Individual Therapy  Tonny Branch 04/24/2016, 2:33 PM

## 2016-04-24 NOTE — Plan of Care (Signed)
Problem: RH BLADDER ELIMINATION Goal: RH STG MANAGE BLADDER WITH ASSISTANCE STG Manage Bladder With mod Assistance   Outcome: Not Progressing Patient is still I&O cath

## 2016-04-24 NOTE — Progress Notes (Signed)
SLP Cancellation Note  Patient Details Name: BROCK FANTA MRN: [id number] DOB: Feb 26, 1956   Cancelled treatment:        Pt off floor at renal ultra sound. ST missed 60 minutes.                                                                                              Early Steel B. Rutherford Nail, M.S., CCC-SLP Speech-Language Pathologist    Kashana Breach 04/24/2016, 10:03 AM

## 2016-04-25 ENCOUNTER — Inpatient Hospital Stay (HOSPITAL_COMMUNITY): Payer: Medicare Other

## 2016-04-25 ENCOUNTER — Encounter (HOSPITAL_COMMUNITY): Payer: Medicare Other | Admitting: Speech Pathology

## 2016-04-25 DIAGNOSIS — H5462 Unqualified visual loss, left eye, normal vision right eye: Secondary | ICD-10-CM | POA: Diagnosis present

## 2016-04-25 DIAGNOSIS — R1312 Dysphagia, oropharyngeal phase: Secondary | ICD-10-CM | POA: Diagnosis not present

## 2016-04-25 DIAGNOSIS — R1111 Vomiting without nausea: Secondary | ICD-10-CM | POA: Diagnosis not present

## 2016-04-25 DIAGNOSIS — R488 Other symbolic dysfunctions: Secondary | ICD-10-CM | POA: Diagnosis not present

## 2016-04-25 DIAGNOSIS — R0902 Hypoxemia: Secondary | ICD-10-CM | POA: Diagnosis not present

## 2016-04-25 DIAGNOSIS — R531 Weakness: Secondary | ICD-10-CM | POA: Diagnosis not present

## 2016-04-25 DIAGNOSIS — I255 Ischemic cardiomyopathy: Secondary | ICD-10-CM | POA: Diagnosis not present

## 2016-04-25 DIAGNOSIS — N182 Chronic kidney disease, stage 2 (mild): Secondary | ICD-10-CM | POA: Diagnosis not present

## 2016-04-25 DIAGNOSIS — E870 Hyperosmolality and hypernatremia: Secondary | ICD-10-CM | POA: Diagnosis not present

## 2016-04-25 DIAGNOSIS — Z79899 Other long term (current) drug therapy: Secondary | ICD-10-CM | POA: Diagnosis not present

## 2016-04-25 DIAGNOSIS — I517 Cardiomegaly: Secondary | ICD-10-CM | POA: Diagnosis not present

## 2016-04-25 DIAGNOSIS — E1122 Type 2 diabetes mellitus with diabetic chronic kidney disease: Secondary | ICD-10-CM | POA: Diagnosis present

## 2016-04-25 DIAGNOSIS — E2740 Unspecified adrenocortical insufficiency: Secondary | ICD-10-CM | POA: Diagnosis present

## 2016-04-25 DIAGNOSIS — Z710 Person encountering health services to consult on behalf of another person: Secondary | ICD-10-CM | POA: Diagnosis not present

## 2016-04-25 DIAGNOSIS — N183 Chronic kidney disease, stage 3 (moderate): Secondary | ICD-10-CM | POA: Diagnosis not present

## 2016-04-25 DIAGNOSIS — R112 Nausea with vomiting, unspecified: Secondary | ICD-10-CM | POA: Diagnosis not present

## 2016-04-25 DIAGNOSIS — G629 Polyneuropathy, unspecified: Secondary | ICD-10-CM | POA: Diagnosis not present

## 2016-04-25 DIAGNOSIS — B961 Klebsiella pneumoniae [K. pneumoniae] as the cause of diseases classified elsewhere: Secondary | ICD-10-CM | POA: Diagnosis not present

## 2016-04-25 DIAGNOSIS — B965 Pseudomonas (aeruginosa) (mallei) (pseudomallei) as the cause of diseases classified elsewhere: Secondary | ICD-10-CM | POA: Diagnosis not present

## 2016-04-25 DIAGNOSIS — R339 Retention of urine, unspecified: Secondary | ICD-10-CM

## 2016-04-25 DIAGNOSIS — I5043 Acute on chronic combined systolic (congestive) and diastolic (congestive) heart failure: Secondary | ICD-10-CM | POA: Diagnosis not present

## 2016-04-25 DIAGNOSIS — I130 Hypertensive heart and chronic kidney disease with heart failure and stage 1 through stage 4 chronic kidney disease, or unspecified chronic kidney disease: Secondary | ICD-10-CM | POA: Diagnosis present

## 2016-04-25 DIAGNOSIS — F419 Anxiety disorder, unspecified: Secondary | ICD-10-CM | POA: Diagnosis not present

## 2016-04-25 DIAGNOSIS — Z86718 Personal history of other venous thrombosis and embolism: Secondary | ICD-10-CM | POA: Diagnosis not present

## 2016-04-25 DIAGNOSIS — M545 Low back pain: Secondary | ICD-10-CM | POA: Diagnosis not present

## 2016-04-25 DIAGNOSIS — E785 Hyperlipidemia, unspecified: Secondary | ICD-10-CM | POA: Diagnosis not present

## 2016-04-25 DIAGNOSIS — E1169 Type 2 diabetes mellitus with other specified complication: Secondary | ICD-10-CM | POA: Diagnosis not present

## 2016-04-25 DIAGNOSIS — R748 Abnormal levels of other serum enzymes: Secondary | ICD-10-CM | POA: Diagnosis not present

## 2016-04-25 DIAGNOSIS — F4320 Adjustment disorder, unspecified: Secondary | ICD-10-CM | POA: Diagnosis not present

## 2016-04-25 DIAGNOSIS — Z7901 Long term (current) use of anticoagulants: Secondary | ICD-10-CM | POA: Diagnosis not present

## 2016-04-25 DIAGNOSIS — I160 Hypertensive urgency: Secondary | ICD-10-CM | POA: Diagnosis not present

## 2016-04-25 DIAGNOSIS — K5909 Other constipation: Secondary | ICD-10-CM | POA: Diagnosis present

## 2016-04-25 DIAGNOSIS — I69351 Hemiplegia and hemiparesis following cerebral infarction affecting right dominant side: Secondary | ICD-10-CM | POA: Diagnosis not present

## 2016-04-25 DIAGNOSIS — D649 Anemia, unspecified: Secondary | ICD-10-CM | POA: Diagnosis present

## 2016-04-25 DIAGNOSIS — D62 Acute posthemorrhagic anemia: Secondary | ICD-10-CM | POA: Diagnosis not present

## 2016-04-25 DIAGNOSIS — I6789 Other cerebrovascular disease: Secondary | ICD-10-CM | POA: Diagnosis not present

## 2016-04-25 DIAGNOSIS — N179 Acute kidney failure, unspecified: Secondary | ICD-10-CM | POA: Diagnosis not present

## 2016-04-25 DIAGNOSIS — E1065 Type 1 diabetes mellitus with hyperglycemia: Secondary | ICD-10-CM | POA: Diagnosis not present

## 2016-04-25 DIAGNOSIS — R4189 Other symptoms and signs involving cognitive functions and awareness: Secondary | ICD-10-CM | POA: Diagnosis not present

## 2016-04-25 DIAGNOSIS — W07XXXD Fall from chair, subsequent encounter: Secondary | ICD-10-CM

## 2016-04-25 DIAGNOSIS — R918 Other nonspecific abnormal finding of lung field: Secondary | ICD-10-CM | POA: Diagnosis not present

## 2016-04-25 DIAGNOSIS — I63432 Cerebral infarction due to embolism of left posterior cerebral artery: Secondary | ICD-10-CM | POA: Diagnosis not present

## 2016-04-25 DIAGNOSIS — I5042 Chronic combined systolic (congestive) and diastolic (congestive) heart failure: Secondary | ICD-10-CM | POA: Diagnosis present

## 2016-04-25 DIAGNOSIS — R030 Elevated blood-pressure reading, without diagnosis of hypertension: Secondary | ICD-10-CM | POA: Diagnosis not present

## 2016-04-25 DIAGNOSIS — R1110 Vomiting, unspecified: Secondary | ICD-10-CM | POA: Diagnosis not present

## 2016-04-25 DIAGNOSIS — L89312 Pressure ulcer of right buttock, stage 2: Secondary | ICD-10-CM | POA: Diagnosis present

## 2016-04-25 DIAGNOSIS — M6281 Muscle weakness (generalized): Secondary | ICD-10-CM | POA: Diagnosis not present

## 2016-04-25 DIAGNOSIS — R7989 Other specified abnormal findings of blood chemistry: Secondary | ICD-10-CM | POA: Diagnosis not present

## 2016-04-25 DIAGNOSIS — E1142 Type 2 diabetes mellitus with diabetic polyneuropathy: Secondary | ICD-10-CM | POA: Diagnosis present

## 2016-04-25 DIAGNOSIS — F329 Major depressive disorder, single episode, unspecified: Secondary | ICD-10-CM | POA: Diagnosis not present

## 2016-04-25 DIAGNOSIS — E1151 Type 2 diabetes mellitus with diabetic peripheral angiopathy without gangrene: Secondary | ICD-10-CM | POA: Diagnosis present

## 2016-04-25 DIAGNOSIS — I150 Renovascular hypertension: Secondary | ICD-10-CM | POA: Diagnosis not present

## 2016-04-25 DIAGNOSIS — I129 Hypertensive chronic kidney disease with stage 1 through stage 4 chronic kidney disease, or unspecified chronic kidney disease: Secondary | ICD-10-CM | POA: Diagnosis not present

## 2016-04-25 DIAGNOSIS — L89152 Pressure ulcer of sacral region, stage 2: Secondary | ICD-10-CM | POA: Diagnosis present

## 2016-04-25 DIAGNOSIS — J9601 Acute respiratory failure with hypoxia: Secondary | ICD-10-CM | POA: Diagnosis present

## 2016-04-25 DIAGNOSIS — E1159 Type 2 diabetes mellitus with other circulatory complications: Secondary | ICD-10-CM | POA: Diagnosis not present

## 2016-04-25 DIAGNOSIS — I2510 Atherosclerotic heart disease of native coronary artery without angina pectoris: Secondary | ICD-10-CM | POA: Diagnosis not present

## 2016-04-25 DIAGNOSIS — I639 Cerebral infarction, unspecified: Secondary | ICD-10-CM | POA: Diagnosis not present

## 2016-04-25 DIAGNOSIS — M533 Sacrococcygeal disorders, not elsewhere classified: Secondary | ICD-10-CM | POA: Diagnosis not present

## 2016-04-25 DIAGNOSIS — I6339 Cerebral infarction due to thrombosis of other cerebral artery: Secondary | ICD-10-CM | POA: Diagnosis not present

## 2016-04-25 DIAGNOSIS — L89322 Pressure ulcer of left buttock, stage 2: Secondary | ICD-10-CM | POA: Diagnosis present

## 2016-04-25 DIAGNOSIS — I10 Essential (primary) hypertension: Secondary | ICD-10-CM | POA: Diagnosis not present

## 2016-04-25 DIAGNOSIS — E1165 Type 2 diabetes mellitus with hyperglycemia: Secondary | ICD-10-CM | POA: Diagnosis not present

## 2016-04-25 DIAGNOSIS — E876 Hypokalemia: Secondary | ICD-10-CM | POA: Diagnosis not present

## 2016-04-25 DIAGNOSIS — Z955 Presence of coronary angioplasty implant and graft: Secondary | ICD-10-CM | POA: Diagnosis not present

## 2016-04-25 DIAGNOSIS — I69391 Dysphagia following cerebral infarction: Secondary | ICD-10-CM | POA: Diagnosis not present

## 2016-04-25 DIAGNOSIS — R471 Dysarthria and anarthria: Secondary | ICD-10-CM | POA: Diagnosis not present

## 2016-04-25 DIAGNOSIS — Z794 Long term (current) use of insulin: Secondary | ICD-10-CM | POA: Diagnosis not present

## 2016-04-25 DIAGNOSIS — N390 Urinary tract infection, site not specified: Secondary | ICD-10-CM | POA: Diagnosis not present

## 2016-04-25 DIAGNOSIS — I252 Old myocardial infarction: Secondary | ICD-10-CM | POA: Diagnosis not present

## 2016-04-25 DIAGNOSIS — I248 Other forms of acute ischemic heart disease: Secondary | ICD-10-CM | POA: Diagnosis present

## 2016-04-25 LAB — BASIC METABOLIC PANEL
ANION GAP: 11 (ref 5–15)
BUN: 23 mg/dL — AB (ref 4–21)
BUN: 23 mg/dL — ABNORMAL HIGH (ref 6–20)
CALCIUM: 9.4 mg/dL (ref 8.9–10.3)
CO2: 26 mmol/L (ref 22–32)
Chloride: 109 mmol/L (ref 101–111)
Creatinine, Ser: 1.33 mg/dL — ABNORMAL HIGH (ref 0.61–1.24)
Creatinine: 1.3 mg/dL (ref 0.6–1.3)
GFR, EST NON AFRICAN AMERICAN: 57 mL/min — AB (ref >60)
Glucose, Bld: 180 mg/dL — ABNORMAL HIGH (ref 65–99)
Glucose: 180 mg/dL
POTASSIUM: 3.5 mmol/L (ref 3.5–5.1)
SODIUM: 146 mmol/L (ref 137–147)
SODIUM: 146 mmol/L — AB (ref 135–145)

## 2016-04-25 LAB — GLUCOSE, CAPILLARY
GLUCOSE-CAPILLARY: 181 mg/dL — AB (ref 65–99)
Glucose-Capillary: 210 mg/dL — ABNORMAL HIGH (ref 65–99)

## 2016-04-25 MED ORDER — HYDROCORTISONE 2.5 % RE CREA: TOPICAL_CREAM | Freq: Two times a day (BID) | RECTAL | 0 refills | Status: DC | PRN

## 2016-04-25 MED ORDER — INSULIN ASPART PROT & ASPART (70-30 MIX) 100 UNIT/ML ~~LOC~~ SUSP: 30.0000 [IU] | Freq: Every day | SUBCUTANEOUS | 11 refills | Status: DC

## 2016-04-25 MED ORDER — INSULIN ASPART 100 UNIT/ML ~~LOC~~ SOLN: 8.0000 [IU] | Freq: Three times a day (TID) | SUBCUTANEOUS | 11 refills | Status: DC

## 2016-04-25 MED ORDER — TAMSULOSIN HCL 0.4 MG PO CAPS: 0.4000 mg | ORAL_CAPSULE | Freq: Every day | ORAL | Status: DC

## 2016-04-25 MED ORDER — AMLODIPINE BESYLATE 10 MG PO TABS: 10.0000 mg | ORAL_TABLET | Freq: Every day | ORAL | Status: DC

## 2016-04-25 MED ORDER — POTASSIUM CHLORIDE 20 MEQ/15ML (10%) PO SOLN: 40.0000 meq | Freq: Two times a day (BID) | ORAL | 0 refills | Status: DC

## 2016-04-25 MED ORDER — CHLORTHALIDONE 25 MG PO TABS: 75.0000 mg | ORAL_TABLET | Freq: Every day | ORAL | Status: DC

## 2016-04-25 MED ORDER — HYDROCERIN EX CREA: 1.0000 "application " | TOPICAL_CREAM | Freq: Two times a day (BID) | CUTANEOUS | 0 refills | Status: DC

## 2016-04-25 MED ORDER — BETHANECHOL CHLORIDE 10 MG PO TABS: 10.0000 mg | ORAL_TABLET | Freq: Three times a day (TID) | ORAL | Status: DC

## 2016-04-25 MED ORDER — MIRTAZAPINE 7.5 MG PO TABS: 7.5000 mg | ORAL_TABLET | Freq: Every day | ORAL | Status: DC

## 2016-04-25 MED ORDER — LIDOCAINE HCL 2 % EX GEL: CUTANEOUS | 0 refills | Status: DC | PRN

## 2016-04-25 MED ORDER — POTASSIUM CHLORIDE 20 MEQ/15ML (10%) PO SOLN: 40.0000 meq | Freq: Two times a day (BID) | ORAL | Status: DC

## 2016-04-25 MED ORDER — POLYETHYLENE GLYCOL 3350 17 G PO PACK: 17.0000 g | PACK | Freq: Every day | ORAL | 0 refills | Status: DC

## 2016-04-25 MED ORDER — ACETAMINOPHEN 160 MG/5ML PO SOLN: 325.0000 mg | ORAL | 0 refills | Status: DC | PRN

## 2016-04-25 MED ORDER — BETHANECHOL CHLORIDE 10 MG PO TABS
10.0000 mg | ORAL_TABLET | Freq: Three times a day (TID) | ORAL | Status: DC
  Administered 2016-04-25: 10 mg via ORAL
  Filled 2016-04-25: qty 1

## 2016-04-25 MED ORDER — INSULIN ASPART PROT & ASPART (70-30 MIX) 100 UNIT/ML ~~LOC~~ SUSP: 45.0000 [IU] | Freq: Every day | SUBCUTANEOUS | 11 refills | Status: DC

## 2016-04-25 MED ORDER — RIVAROXABAN 20 MG PO TABS: 20.0000 mg | ORAL_TABLET | Freq: Every day | ORAL | Status: DC

## 2016-04-25 MED ORDER — FLUOXETINE HCL 10 MG PO CAPS: 10.0000 mg | ORAL_CAPSULE | Freq: Every day | ORAL | 3 refills | Status: DC

## 2016-04-25 NOTE — Progress Notes (Signed)
Speech Language Pathology Daily Session Note  Patient Details  Name: [name] MRN: [id number] Date of Birth: [birth year]  Today's Date: 04/25/2016 SLP Individual Time: [id number] SLP Individual Time Calculation (min): 8 min  Short Term Goals: Week 3: SLP Short Term Goal 1 (Week 3): Pt will demonstrate sustained attention to basic, familiar tasks for 5 minutes with Max A cues for redirection.  SLP Short Term Goal 2 (Week 3): Given choice of orientation information, pt will select correct answer with Max A multimodal cues.  SLP Short Term Goal 3 (Week 3): Pt will utilize speech intelligibility strategies with Max A verbal cues to achieve ~>75% intelligibility at the phrase level.  SLP Short Term Goal 4 (Week 3): Given Mod A multimodal cues, pt will complete functional 1 step basic, familiar directions.  SLP Short Term Goal 5 (Week 3): Pt will consume least restrictive diet without overt s/s of aspiration with Mod A cues for use of compensatory swallow strategies.   Skilled Therapeutic Interventions: Skilled treatment session focused on self care/home management information. SLP reviewed results of Modified Barium Swallow Study along with current diet recommendation with daughter. Of note, pt with new neck extension during all intake to possibly aid with AP transfer of all consistencies assessed. Pt with decreased oral manipulation of all consistencies and lingual pumping. Given this decline and consistent s/s of aspiratin at bedside with all trials of thin liquids, recommend consevrative diet of dysphagia 2 with nectar thick liquids and trials of thin liquids under supervision and following oral care.      Function:  Eating Eating   Modified Consistency Diet: Yes Eating Assist Level: Supervision or verbal cues;Helper feeds patient;Help managing cup/glass   Eating Set Up Assist For: Opening containers;Cutting food Helper Scoops Food on Utensil: Every scoop Helper Brings Food to Mouth:  Every scoop   Cognition Comprehension Comprehension assist level: Understands basic 50 - 74% of the time/ requires cueing 25 - 49% of the time  Expression   Expression assist level: Expresses basic 25 - 49% of the time/requires cueing 50 - 75% of the time. Uses single words/gestures.  Social Interaction Social Interaction assist level: Interacts appropriately 25 - 49% of time - Needs frequent redirection.  Problem Solving Problem solving assist level: Solves basic 25 - 49% of the time - needs direction more than half the time to initiate, plan or complete simple activities  Memory Memory assist level: Recognizes or recalls 50 - 74% of the time/requires cueing 25 - 49% of the time    Pain    Therapy/Group: Individual Therapy   Parker Sawatzky B. Rutherford Nail, M.S., CCC-SLP Speech-Language Pathologist   [name] 04/25/2016, 10:33 AM

## 2016-04-25 NOTE — Progress Notes (Signed)
[facility] PHYSICAL MEDICINE & REHABILITATION     PROGRESS NOTE  Subjective/Complaints:  Pt seen laying in bed this AM.  He states he feels good.  He had a fall yesterday, which patient states he rememebres.  He denies complaints this AM.    ROS: Denies CP, SOB, N/V/D, limited reliability.  Objective: Vital Signs: Blood pressure (!) 160/84, pulse 71, temperature 98.2 F (36.8 C), temperature source Oral, resp. rate 18, height 5\' 9"  (1.753 m), weight 111.6 kg (246 lb 0.5 oz), SpO2 98 %. Dg Shoulder Right  Result Date: 04/24/2016 CLINICAL DATA:  Pain after falling from chair. EXAM: RIGHT SHOULDER - 2+ VIEW COMPARISON:  None. FINDINGS: No acute fracture. No dislocations. Osteoarthritic joint space narrowing of the AC and glenohumeral joints. Soft tissue calcification adjacent to greater tuberosity may reflect rotator cuff tendinopathy. Mild tug related muscle insertional change noted of the proximal humerus. IMPRESSION: 1. Osteoarthritis of the [facility] and glenohumeral joints. 2. No acute fracture or bone destruction. 3. Calcific rotator cuff tendinopathy. Electronically Signed   By: Ashley Royalty M.D.   On: 04/24/2016 19:32   Dg Elbow 2 Views Right  Result Date: 04/24/2016 CLINICAL DATA:  Pain after fall from chair EXAM: RIGHT ELBOW - 2 VIEW COMPARISON:  None. FINDINGS: Soft tissue swelling and induration posterior to the elbow joint without acute fracture or malalignment. No definite joint effusion. The lateral view is slightly oblique limiting assessment for joint effusions. IMPRESSION: Negative for acute fracture or malalignment. Dorsal soft tissue swelling of the distal arm and proximal forearm. Electronically Signed   By: Ashley Royalty M.D.   On: 04/24/2016 19:36   Ct Head Wo Contrast  Result Date: 04/24/2016 CLINICAL DATA:  Patient fell from chair.  Unwitnessed fall. EXAM: CT HEAD WITHOUT CONTRAST TECHNIQUE: Contiguous axial images were obtained from the base of the skull through the vertex without  intravenous contrast. COMPARISON:  04/19/2016 CT FINDINGS: BRAIN: There is sulcal and ventricular prominence consistent with superficial and central atrophy. No intraparenchymal hemorrhage, mass effect nor midline shift. Periventricular and subcortical white matter hypodensities consistent with a moderate degree of chronic small vessel ischemic disease are identified. Chronic left pontine and bilateral basal ganglial lacunar infarcts. No acute large vascular territory infarcts. No abnormal extra-axial fluid collections. Basal cisterns are not effaced and midline. VASCULAR: Moderate calcific atherosclerosis of the carotid siphons. SKULL: No skull fracture. No significant scalp soft tissue swelling. SINUSES/ORBITS: The mastoid air-cells are clear. The included paranasal sinuses are well-aerated.The included ocular globes and orbital contents are non-suspicious. Bilateral lens replacements surgeries. OTHER: None. IMPRESSION: Chronic stable moderate small vessel ischemic disease of periventricular and subcortical white matter. Chronic left pontine and bilateral basal ganglial lacunar infarcts. No acute intracranial abnormality nor fracture. Electronically Signed   By: Ashley Royalty M.D.   On: 04/24/2016 19:39   Dg Hip Unilat With Pelvis 2-3 Views Right  Result Date: 04/24/2016 CLINICAL DATA:  Right hip pain EXAM: DG HIP (WITH OR WITHOUT PELVIS) 2-3V RIGHT COMPARISON:  None. FINDINGS: There is no evidence of hip fracture or dislocation. Hip joints are maintained bilaterally without significant joint space narrowing. The bony pelvis appears intact. There is sclerosis along the expected location of the L5-S1 facets consistent with lumbar facet arthropathy. SI joints and pubic symphysis are maintained. Ileofemoral and branch vessel vascular calcifications are noted bilaterally. IMPRESSION: No acute osseous abnormality identified of the bony pelvis and either hip. L5-S1 facet arthropathy. Vascular calcifications as above  described. Electronically Signed   By: Shanon Brow  Randel Pigg M.D.   On: 04/24/2016 19:35    Recent Labs  04/23/16 1151  WBC 6.7  HGB 13.7  HCT 43.7  PLT 237    Recent Labs  04/23/16 1151 04/25/16 0357  NA 146* 146*  K 4.3 3.5  CL 109 109  GLUCOSE 309* 180*  BUN 32* 23*  CREATININE 1.46* 1.33*  CALCIUM 9.8 9.4   CBG (last 3)   Recent Labs  04/24/16 2140 04/25/16 0002 04/25/16 0647  GLUCAP 82 105* 181*    Wt Readings from Last 3 Encounters:  04/25/16 111.6 kg (246 lb 0.5 oz)  03/28/16 111.1 kg (245 lb)  09/04/13 120.2 kg (265 lb)    Physical Exam:  BP (!) 160/84 (BP Location: Right Arm)   Pulse 71   Temp 98.2 F (36.8 C) (Oral)   Resp 18   Ht 5\' 9"  (1.753 m)   Wt 111.6 kg (246 lb 0.5 oz)   SpO2 98%   BMI 36.33 kg/m  Constitutional: He appears well-developed. Obese. HENT: Normocephalic and atraumatic.  Eyes: EOM are normal. No discharge.  Cardiovascular: RRR. No JVD. Respiratory: Effort normal and breath sounds normal.  GI: Soft. Bowel sounds are normal.  Musculoskeletal: He exhibits no edema. He exhibits no tenderness.  Neurological: Keeps eyes closed. Dysarthric  Limited vision.  Poor insight and awareness.    Motor: 4+/5 RUE prox to distal. Ataxia +/- dysmetria (unchanged, confounded by vision).  RLE: 4/+/5 proximal to distal  LLE 4+ /5 proximal to distal.  LUE/LLE proximal to distal 5/5.  Skin: Warm and dry. B/l DTIs.  Psychiatric: Flat affect. Limited initiation.   Assessment/Plan: 1. Functional deficits secondary to left paramedian pontine infarct which require 3+ hours per day of interdisciplinary therapy in a comprehensive inpatient rehab setting. Physiatrist is providing close team supervision and 24 hour management of active medical problems listed below. Physiatrist and rehab team continue to assess barriers to discharge/monitor patient progress toward functional and medical goals.  Function:  Bathing Bathing position   Position:  Wheelchair/chair at sink  Bathing parts Body parts bathed by patient: Right arm, Left arm, Chest, Abdomen, Front perineal area, Right upper leg, Left upper leg Body parts bathed by helper: Left lower leg, Right lower leg, Buttocks  Bathing assist Assist Level: Touching or steadying assistance(Pt > 75%)      Upper Body Dressing/Undressing Upper body dressing   What is the patient wearing?: Pull over shirt/dress     Pull over shirt/dress - Perfomed by patient: Thread/unthread right sleeve, Put head through opening, Pull shirt over trunk Pull over shirt/dress - Perfomed by helper: Put head through opening, Pull shirt over trunk, Thread/unthread right sleeve Button up shirt - Perfomed by patient: Thread/unthread left sleeve, Thread/unthread right sleeve Button up shirt - Perfomed by helper: Pull shirt around back, Button/unbutton shirt    Upper body assist Assist Level: Touching or steadying assistance(Pt > 75%)      Lower Body Dressing/Undressing Lower body dressing   What is the patient wearing?: Pants, Socks     Pants- Performed by patient: Thread/unthread left pants leg Pants- Performed by helper: Thread/unthread right pants leg, Pull pants up/down   Non-skid slipper socks- Performed by helper: Don/doff right sock, Don/doff left sock       Shoes - Performed by helper: Don/doff right shoe, Don/doff left shoe, Fasten right, Fasten left   AFO - Performed by helper: Don/doff right AFO, Don/doff left AFO (ACE wraps)   TED Hose - Performed by helper: Don/doff right  TED hose, Don/doff left TED hose  Lower body assist Assist for lower body dressing:  (Max A)      Toileting Toileting Toileting activity did not occur: No continent bowel/bladder event   Toileting steps completed by helper: Adjust clothing prior to toileting, Performs perineal hygiene, Adjust clothing after toileting Toileting Assistive Devices: Other (comment) (steady )  Toileting assist Assist level: Two helpers    Transfers Chair/bed transfer   Chair/bed transfer method: Stand pivot Chair/bed transfer assist level: Moderate assist (Pt 50 - 74%/lift or lower) Chair/bed transfer assistive device: Mechanical lift Mechanical lift: Stedy   Locomotion Ambulation     Max distance: 30 ft Assist level: Maximal assist (Pt 25 - 49%)   Wheelchair   Type: Manual Max wheelchair distance: 50' Assist Level: Dependent (Pt equals 0%)  Cognition Comprehension Comprehension assist level: Understands basic 50 - 74% of the time/ requires cueing 25 - 49% of the time  Expression Expression assist level: Expresses basic 25 - 49% of the time/requires cueing 50 - 75% of the time. Uses single words/gestures.  Social Interaction Social Interaction assist level: Interacts appropriately 25 - 49% of time - Needs frequent redirection.  Problem Solving Problem solving assist level: Solves basic 25 - 49% of the time - needs direction more than half the time to initiate, plan or complete simple activities  Memory Memory assist level: Recognizes or recalls 50 - 74% of the time/requires cueing 25 - 49% of the time    Medical Problem List and Plan: 1.  Right hemiparesis and functional deficits secondary to left paramedian pontine infarct on 1/31  D/c to SNF  Will see patient in 1 month for follow  Baclofen started for spasms d/ced on 2/14 due to lethargy   Repeat CT head reviewed 2/22, no acute changes.  Spoke with PA and nursing Repeat CT on 2/27 due to fall reviewed, stable. 2. H/o DVT /Anticoagulation: Pharmaceutical: Xarelto 3. Pain Management:   Lidoderm patch started due to therapy reports of pain - patient states improved, however, ?reliability.  4. Mood: LCSW to follow for evaluation and support.   Fluoxetine started 2/15  Amantadine started on 2/19 d/ced on 2/27 per family  Trial Ritalin started 2/22, d/ced on 2/27 per family 5. Neuropsych: This patient is not yet capable of making decisions on his own  behalf.  Appreciate Neuropsych eval 6. Skin/Wound Care: Routine pressure relief measures. Eucerin to b/l LE.  Prevalon boots ordered for DTI 7. Fluids/Electrolytes/Nutrition: Monitor I/Os  Downgraded to D2, Nectar  Intake has been variable affecting CBGs  Remeron added 2/15, with improvement 8. HTN: Monitor BP.   Continue coreg, cozaar, Bidil and lasix.     Chlorthalidone increased on 2/8, again on 2/12, increased again on 2/15  Norvasc 5 started 2/22, increased on 2/26  Hypertensive crisis overnight, should improve with dc/ing other meds  Renal artery ultrasound unremarkable for stenosis 9. T2DM with retinopathy and neuropathy: Blood sugars poorly controlled with A1C- 11.0. Consult RD to educated on [place]. Monitor BS ac/hs.   Novolog 70/30 30 AM, increased to 35U on 2/22, increased to 45U on 2/26  Novolog 70/30 30 PM  Novalog decreased to 2U TID, increased to 5U on 2/21  qHS snack added 2/14  Remains variable based on intake 10. CAD/ Acute systolic CHF/NICM: compensated. Check daily weights and monitor for signs of overload. Heart healthy/low salt diet. Will need perfusion study on outpatient basis per cardiology. Cont meds Filed Weights   04/23/16 [id number] 04/24/16 0425  04/25/16 0403  Weight: 114.9 kg (253 lb 4.9 oz) 112.5 kg (248 lb) 111.6 kg (246 lb 0.5 oz)   Clinically stable 11. Morbid Obesity: BMI - 36. Encourage appropriate diet, weight loss and exercise.  12. CKD:   Cr. 1.33 on 2/28 (around baseline)  Encourage fluids  IVF qhs at 50cc/hr, d/ced 2/20 13. Hypokalemia:   Increased K+ supplement 2/9  K+ 3.5 on 2/28  Continue supplement 14. ABLA: Resolved  Cont to monitor 15. Acute lower UTI  UA+, Ucx with Pseudomonas and enterococcus  Spoke with pharmacy, macrobid changed to Cipro, completed 2/12-2/18 16. Fall  On 2/27, CT head, hip/elbow xray reviewed, no acute changes. 17. Urinary retention  Bethanechol 10 TID started 2/28  LOS (Days) 22 A FACE TO FACE EVALUATION  WAS PERFORMED  Arjan Strohm Lorie Phenix 04/25/2016 8:17 AM

## 2016-04-25 NOTE — Progress Notes (Signed)
Social Work  Discharge Note  The overall goal for the admission was met for:   Discharge location: [facility]  Length of Stay: Yes-22 DAYS  Discharge activity level: Yes-MIN-MOD LEVEL  Home/community participation: Yes  Services provided included: MD, RD, PT, OT, SLP, RN, CM, TR, Pharmacy, Neuropsych and SW  Financial Services: Medicare  Follow-up services arranged: Other: NHP  Comments (or additional information):PT NEEDS TO BE AT A HIGHER LEVEL FOR DAUGHTER'S TO TAKE HIM HOME. DAUGHTER SUPPOSE TO APPLY FOR MEDICAID SO HE HAS A SUPPLEMENT TO MEDICARE.  Patient/Family verbalized understanding of follow-up arrangements: Yes  Individual responsible for coordination of the follow-up plan: NICOLE-DAUGHTER & BRANDI-DAUGHTER  Confirmed correct DME delivered: Elease Hashimoto 04/25/2016    Elease Hashimoto

## 2016-04-25 NOTE — Progress Notes (Addendum)
Social Work Patient ID: [name], male   DOB: 1955/09/06, 61 y.o.   MRN: [id number]  Spoke with [facility] this am may not have a bed for pt today, awaiting call form covering Administrator to let worker know regarding bed situation. Will keep team updated.  Daughter here and reports [name] does not have a bed today but they have decided to go with [facility] since they know staff there and they have a private room for him today. Will contact [name] and go form here. [name] will take pt today at [facility] after lunch. Will work on transfer to [facility].

## 2016-04-25 NOTE — Progress Notes (Signed)
Speech Language Pathology Discharge Summary  Patient Details  Name: [name] MRN: [id number] Date of Birth: 1955-06-05  Today's Date: 04/25/2016     Patient has met 2 of 7 long term goals.  Patient to discharge at overall Max level.  Reasons goals not met: decline in ability   Clinical Impression/Discharge Summary:   Pt with decline in function over last reporting period and is currently at [facility] multimodal level for initiation, expressive communication, use of compensatory swallow strategies and basic problem solving.  Care Partner:  Caregiver Able to Provide Assistance: No  Type of Caregiver Assistance: Physical;Cognitive  Recommendation:  Skilled Nursing facility  Rationale for SLP Follow Up: Maximize functional communication;Maximize cognitive function and independence;Maximize swallowing safety   Equipment:     Reasons for discharge: Lack of progress towards goals   Patient/Family Agrees with Progress Made and Goals Achieved: Yes   Function:   [name] B. Rutherford Nail, M.S., CCC-SLP Speech-Language Pathologist  Neoma Uhrich 04/25/2016, 4:06 PM

## 2016-04-25 NOTE — Progress Notes (Signed)
Patient bladder scan was 382, at 6:30 am but unable to I&O cath due to resistance. On call notified she order coude cath but Linna Hoff (PA) suggested to wait may be Pam may decide to insert foley since he going to SNF today.

## 2016-04-25 NOTE — Plan of Care (Signed)
Problem: RH BLADDER ELIMINATION Goal: RH STG MANAGE BLADDER WITH ASSISTANCE STG Manage Bladder With mod Assistance   Outcome: Not Met (add Reason) Total assistance- straight catheter. Pt discharging to snf     

## 2016-04-26 ENCOUNTER — Ambulatory Visit: Payer: Medicare Other | Admitting: Physician Assistant

## 2016-04-26 ENCOUNTER — Encounter: Payer: Self-pay | Admitting: Physician Assistant

## 2016-04-26 LAB — URINE CULTURE: CULTURE: NO GROWTH

## 2016-04-26 NOTE — Progress Notes (Deleted)
Cardiology Office Note   Date:  04/26/2016   ID:  [name], [name] 07-Jun-1955, MRN :[id number]  PCP:  [name], MD  Cardiologist:  Rosaria Ferries, PA-C   No chief complaint on file.   History of Present Illness: [name] is a 61 y.o. male with a history of DM, HTN, DES x 2 RCA 2007, CKD, S-CHF (EF 24% in 2001, up to 60% 2014 echo), NICM w/ LVD out of proportion to the degree of CAD with 80-90% CFX treated medically 2001, HLD, DVT on Xarelto  Admit 04/03/2016-04/25/2016 for CVA, cardiology saw for elevated troponin and acute systolic CHF. Outpatient stress test planned  Fransico Setters Fleer presents for ***   Past Medical History:  Diagnosis Date  . Adrenal insufficiency ([place])   . Allergy   . Arthritis   . Blind left eye   . Carpal tunnel syndrome   . Cataract    bil removed  . CHF (congestive heart failure) ([place])   . Diabetes mellitus without complication ([place])   . ED (erectile dysfunction)   . Gout   . Herpes   . Hypercholesterolemia   . Hypertension   . MI (myocardial infarction)   . Peripheral neuropathy ([place])   . Pneumonia   . PVD (peripheral vascular disease) ([place])     Past Surgical History:  Procedure Laterality Date  . CHOLECYSTECTOMY    . dilatera cataracts removed    . right knee surgary    . stents      Current Outpatient Prescriptions  Medication Sig Dispense Refill  . acetaminophen (TYLENOL) 160 MG/5ML solution Take 10.2-20.3 mLs (325-650 mg total) by mouth every 4 (four) hours as needed for mild pain. 120 mL 0  . amLODipine (NORVASC) 10 MG tablet Take 1 tablet (10 mg total) by mouth daily.    Marland Kitchen atorvastatin (LIPITOR) 40 MG tablet Take 1 tablet (40 mg total) by mouth daily at 6 PM. 30 tablet 0  . bethanechol (URECHOLINE) 10 MG tablet Take 1 tablet (10 mg total) by mouth 3 (three) times daily.    . carvedilol (COREG) 25 MG tablet Take 1 tablet (25 mg total) by mouth 2 (two) times daily with a meal. 60 tablet 0  .  chlorthalidone (HYGROTON) 25 MG tablet Take 3 tablets (75 mg total) by mouth daily.    . cloNIDine (CATAPRES) 0.3 MG tablet Take 1 tablet (0.3 mg total) by mouth 3 (three) times daily. 60 tablet 11  . FLUoxetine (PROZAC) 10 MG capsule Take 1 capsule (10 mg total) by mouth daily.  3  . furosemide (LASIX) 80 MG tablet Take 80 mg by mouth daily.     . hydrocerin (EUCERIN) CREA Apply 1 application topically 2 (two) times daily.  0  . hydrocortisone (ANUSOL-HC) 2.5 % rectal cream Place rectally 2 (two) times daily as needed for hemorrhoids or itching. 30 g 0  . insulin aspart (NOVOLOG) 100 UNIT/ML injection Inject 8 Units into the skin 3 (three) times daily with meals. 10 mL 11  . insulin aspart protamine- aspart (NOVOLOG MIX 70/30) (70-30) 100 UNIT/ML injection Inject 0.3 mLs (30 Units total) into the skin daily with supper. 10 mL 11  . insulin aspart protamine- aspart (NOVOLOG MIX 70/30) (70-30) 100 UNIT/ML injection Inject 0.45 mLs (45 Units total) into the skin daily with breakfast. 10 mL 11  . isosorbide-hydrALAZINE (BIDIL) 20-37.5 MG tablet Take 2 tablets by mouth 3 (three) times daily. 90 tablet 0  . lidocaine (  XYLOCAINE) 2 % jelly Apply topically as needed (Use with in and out catheter). 30 mL 0  . losartan (COZAAR) 100 MG tablet Take 1 tablet (100 mg total) by mouth daily. 30 tablet 0  . mirtazapine (REMERON) 7.5 MG tablet Take 1 tablet (7.5 mg total) by mouth at bedtime.    . polyethylene glycol (MIRALAX / GLYCOLAX) packet Take 17 g by mouth daily. 14 each 0  . potassium chloride 20 MEQ/15ML (10%) SOLN Take 30 mLs (40 mEq total) by mouth 2 (two) times daily. 900 mL 0  . rivaroxaban (XARELTO) 20 MG TABS tablet Take 1 tablet (20 mg total) by mouth daily with supper. 30 tablet   . senna-docusate (SENOKOT-S) 8.6-50 MG tablet Take 1 tablet by mouth daily. 30 tablet 0  . tamsulosin (FLOMAX) 0.4 MG CAPS capsule Take 1 capsule (0.4 mg total) by mouth daily after supper. 30 capsule    No current  facility-administered medications for this visit.     Allergies:   Iohexol and Ivp dye [iodinated diagnostic agents]    Social History:  The patient  reports that he has never smoked. He has never used smokeless tobacco. He reports that he does not drink alcohol or use drugs.   Family History:  The patient's family history includes Breast cancer in his mother; Diabetes in his maternal aunt; Heart attack in his sister; Hypertension in his father.    ROS:  Please see the history of present illness. All other systems are reviewed and negative.    PHYSICAL EXAM: VS:  There were no vitals taken for this visit. , BMI There is no height or weight on file to calculate BMI. GEN: Well nourished, well developed, male in no acute distress  HEENT: normal for age  Neck: no JVD, no carotid bruit, no masses Cardiac: RRR; no murmur, no rubs, or gallops Respiratory:  clear to auscultation bilaterally, normal work of breathing GI: soft, nontender, nondistended, + BS MS: no deformity or atrophy; no edema; distal pulses are 2+ in all 4 extremities   Skin: warm and dry, no rash Neuro:  Strength and sensation are intact Psych: euthymic mood, full affect   EKG:  EKG {ACTION; IS/IS [id number] ordered today. The ekg ordered today demonstrates ***  ECHO: 03/29/2016  Left ventricle: The cavity size was mildly dilated. Wall thickness was increased in a pattern of mild LVH. Systolic function was severely reduced. The estimated ejection fraction was in the range of 25% to 30%. Diffuse hypokinesis. There is akinesis of the inferolateral myocardium. Doppler parameters are consistent with abnormal left ventricular relaxation (grade 1 diastolic dysfunction). Doppler parameters are consistent with high ventricular filling pressure. - Left atrium: The atrium was mildly dilated. - Right ventricle: Systolic function was moderately reduced.    Recent Labs: 04/01/2016: Magnesium 1.9 04/04/2016:  ALT 12 04/23/2016: Hemoglobin 13.7; Platelets 237 04/25/2016: BUN 23; Creatinine, Ser 1.33; Potassium 3.5; Sodium 146    Lipid Panel    Component Value Date/Time   CHOL 235 (H) 03/29/2016 0531   TRIG 160 (H) 03/29/2016 0531   HDL 36 (L) 03/29/2016 0531   CHOLHDL 6.5 03/29/2016 0531   VLDL 32 03/29/2016 0531   LDLCALC 167 (H) 03/29/2016 0531     Wt Readings from Last 3 Encounters:  04/25/16 246 lb 0.5 oz (111.6 kg)  03/28/16 245 lb (111.1 kg)  09/04/13 265 lb (120.2 kg)     Other studies Reviewed: Additional studies/ records that were reviewed today include: ***.  ASSESSMENT AND PLAN:  1.  ***   Current medicines are reviewed at length with the patient today.  The patient {ACTIONS; HAS/DOES NOT HAVE:19233} concerns regarding medicines.  The following changes have been made:  {PLAN; NO CHANGE:13088:s}  Labs/ tests ordered today include: *** No orders of the defined types were placed in this encounter.    Disposition:   FU with ***  Signed, Lenoard Aden  04/26/2016 7:53 AM    [facility] Phone: [phone]; Fax: [phone]  This note was written with the assistance of speech recognition software. Please excuse any transcriptional errors.

## 2016-04-27 ENCOUNTER — Encounter: Payer: Self-pay | Admitting: Internal Medicine

## 2016-04-27 ENCOUNTER — Non-Acute Institutional Stay (SKILLED_NURSING_FACILITY): Payer: Medicare Other | Admitting: Internal Medicine

## 2016-04-27 DIAGNOSIS — I150 Renovascular hypertension: Secondary | ICD-10-CM | POA: Diagnosis not present

## 2016-04-27 DIAGNOSIS — I6339 Cerebral infarction due to thrombosis of other cerebral artery: Secondary | ICD-10-CM

## 2016-04-27 DIAGNOSIS — F329 Major depressive disorder, single episode, unspecified: Secondary | ICD-10-CM | POA: Diagnosis not present

## 2016-04-27 DIAGNOSIS — B965 Pseudomonas (aeruginosa) (mallei) (pseudomallei) as the cause of diseases classified elsewhere: Secondary | ICD-10-CM

## 2016-04-27 DIAGNOSIS — E1169 Type 2 diabetes mellitus with other specified complication: Secondary | ICD-10-CM | POA: Diagnosis not present

## 2016-04-27 DIAGNOSIS — E1159 Type 2 diabetes mellitus with other circulatory complications: Secondary | ICD-10-CM

## 2016-04-27 DIAGNOSIS — E785 Hyperlipidemia, unspecified: Secondary | ICD-10-CM

## 2016-04-27 DIAGNOSIS — E876 Hypokalemia: Secondary | ICD-10-CM

## 2016-04-27 DIAGNOSIS — I69391 Dysphagia following cerebral infarction: Secondary | ICD-10-CM

## 2016-04-27 DIAGNOSIS — R339 Retention of urine, unspecified: Secondary | ICD-10-CM | POA: Diagnosis not present

## 2016-04-27 DIAGNOSIS — N183 Chronic kidney disease, stage 3 unspecified: Secondary | ICD-10-CM

## 2016-04-27 DIAGNOSIS — Z794 Long term (current) use of insulin: Secondary | ICD-10-CM

## 2016-04-27 DIAGNOSIS — E1165 Type 2 diabetes mellitus with hyperglycemia: Secondary | ICD-10-CM

## 2016-04-27 DIAGNOSIS — N390 Urinary tract infection, site not specified: Secondary | ICD-10-CM | POA: Diagnosis not present

## 2016-04-27 DIAGNOSIS — E870 Hyperosmolality and hypernatremia: Secondary | ICD-10-CM | POA: Diagnosis not present

## 2016-04-27 DIAGNOSIS — B952 Enterococcus as the cause of diseases classified elsewhere: Secondary | ICD-10-CM

## 2016-04-27 DIAGNOSIS — IMO0002 Reserved for concepts with insufficient information to code with codable children: Secondary | ICD-10-CM

## 2016-04-27 DIAGNOSIS — N3080 Other cystitis without hematuria: Secondary | ICD-10-CM

## 2016-04-27 NOTE — Progress Notes (Signed)
: Provider:  [name] Coil, MD Location:  [facility] Room Number: [id number] Place of Service:  SNF (31)  PCP: [name], MD Patient Care Team: Maury Dus, MD as PCP - General (Family Medicine)  Extended Emergency Contact Information Primary Emergency Contact: Terry,Nicole Address: [address]           [address] [place] Phone: [phone] Relation: Daughter Secondary Emergency Contact: Omer [place] Phone: [phone] Mobile Phone: [phone] Relation: Daughter     Allergies: Iohexol and Ivp dye [iodinated diagnostic agents]  Chief Complaint  Patient presents with  . New Admit To SNF    Admit to Facility    HPI: Patient is 61 y.o. male with CAD, with ICM, diastolic CHF, [id number], peripheral neuropathy, DVT- on Xarelto who was admitted on 03/28/16 with right sided numbness. BP elevated in ED and patient with ST changes in inferolateral leads. UDS negative. 2 D echo done revealing mild LVH with global hypokinesis with akinesis of inferolateral wall and EF 25- 30% with grade 1. Dr. Warren Lacy recommended outpatient perfusion study and Bidil and coreg added for management of cardiomyopathy.MRI brain done revealing old bilateral thalamic infarcts, bilateral basal ganglia lacunar infarcts, multiple old pontine and cerebellar infarcts but repeat MRI brain done revealing 9 mm acute ischemic infarct within left paramedian pons.Pt started on xarelto for secondary stroke prevention. Therapy evaluations done revealing right sided weakness, chronic visual deficits, unsteady gait with inability to advance RLE and ataxic gait. Pt was d/c to inpt therapy which was complicated by labile BP, fluctuating BS due to variable appetite and swallowing issues. Pt was found to have a pseudomonas.enterococcus UTI tx with 7 days of cipro and accompanied by urinary retention tx with in and out caths.  He was started on prozac and remeron for activation and mood with improvement. Continues with cognitive impairment.[facility] 13/22) .Pt is d/c to SNF for continues therapy and supportive care, more than can be provided at home. While at SNF pt will be followed for HTN, tx with norvasc, coreg,hygroton, clonidine, lasixbidil and losartan, HLD, tx with lipitor and DM, tx with insulin.  Past Medical History:  Diagnosis Date  . Adrenal insufficiency ([place])   . Allergy   . Arthritis   . Blind left eye   . Carpal tunnel syndrome   . Cataract    bil removed  . CHF (congestive heart failure) ([place])   . Diabetes mellitus without complication ([place])   . ED (erectile dysfunction)   . Gout   . Herpes   . Hypercholesterolemia   . Hypertension   . MI (myocardial infarction)   . Peripheral neuropathy ([place])   . Pneumonia   . PVD (peripheral vascular disease) ([place])     Past Surgical History:  Procedure Laterality Date  . CHOLECYSTECTOMY    . CORONARY ANGIOPLASTY WITH STENT PLACEMENT  2007   two 2.5  x 13 mm Cypher stents to the RCA  . dilatera cataracts removed    . right knee surgary      Allergies as of 04/27/2016      Reactions   Iohexol Hives   Ivp Dye [iodinated Diagnostic Agents] Hives      Medication List       Accurate as of 04/27/16 11:38 AM. Always use your most recent med list.          acetaminophen 160 MG/5ML solution Commonly known as:  TYLENOL Take 10.2-20.3 mLs (  325-650 mg total) by mouth every 4 (four) hours as needed for mild pain.   amLODipine 10 MG tablet Commonly known as:  NORVASC Take 1 tablet (10 mg total) by mouth daily.   atorvastatin 40 MG tablet Commonly known as:  LIPITOR Take 1 tablet (40 mg total) by mouth daily at 6 PM.   bethanechol 10 MG tablet Commonly known as:  URECHOLINE Take 1 tablet (10 mg total) by mouth 3 (three) times daily.   carvedilol 25 MG tablet Commonly known as:  COREG Take 1 tablet (25 mg total) by mouth 2 (two) times daily with  a meal.   chlorthalidone 25 MG tablet Commonly known as:  HYGROTON Take 3 tablets (75 mg total) by mouth daily.   cloNIDine 0.3 MG tablet Commonly known as:  CATAPRES Take 1 tablet (0.3 mg total) by mouth 3 (three) times daily.   FLUoxetine 10 MG capsule Commonly known as:  PROZAC Take 1 capsule (10 mg total) by mouth daily.   furosemide 80 MG tablet Commonly known as:  LASIX Take 80 mg by mouth daily.   hydrocerin Crea Apply 1 application topically 2 (two) times daily.   hydrocortisone 2.5 % rectal cream Commonly known as:  ANUSOL-HC Place rectally 2 (two) times daily as needed for hemorrhoids or itching.   insulin aspart 100 UNIT/ML injection Commonly known as:  novoLOG Inject 8 Units into the skin 3 (three) times daily with meals.   insulin aspart protamine- aspart (70-30) 100 UNIT/ML injection Commonly known as:  NOVOLOG MIX 70/30 Inject 0.3 mLs (30 Units total) into the skin daily with supper.   insulin aspart protamine- aspart (70-30) 100 UNIT/ML injection Commonly known as:  NOVOLOG MIX 70/30 Inject 0.45 mLs (45 Units total) into the skin daily with breakfast.   isosorbide-hydrALAZINE 20-37.5 MG tablet Commonly known as:  BIDIL Take 2 tablets by mouth 3 (three) times daily.   lidocaine 2 % jelly Commonly known as:  XYLOCAINE Apply topically as needed (Use with in and out catheter).   losartan 100 MG tablet Commonly known as:  COZAAR Take 1 tablet (100 mg total) by mouth daily.   mirtazapine 7.5 MG tablet Commonly known as:  REMERON Take 1 tablet (7.5 mg total) by mouth at bedtime.   polyethylene glycol packet Commonly known as:  MIRALAX / GLYCOLAX Take 17 g by mouth daily.   potassium chloride 20 MEQ/15ML (10%) Soln Take 30 mLs (40 mEq total) by mouth 2 (two) times daily.   rivaroxaban 20 MG Tabs tablet Commonly known as:  XARELTO Take 1 tablet (20 mg total) by mouth daily with supper.   senna-docusate 8.6-50 MG tablet Commonly known as:   Senokot-S Take 1 tablet by mouth daily.   tamsulosin 0.4 MG Caps capsule Commonly known as:  FLOMAX Take 1 capsule (0.4 mg total) by mouth daily after supper.       No orders of the defined types were placed in this encounter.   Immunization History  Administered Date(s) Administered  . PPD Test 04/25/2016    Social History  Substance Use Topics  . Smoking status: Never Smoker  . Smokeless tobacco: Never Used  . Alcohol use No    Family history is   Family History  Problem Relation Age of Onset  . Breast cancer Mother   . Hypertension Father     blood clot in leg  . Diabetes Maternal Aunt   . Heart attack Sister   . Colon cancer Neg Hx   .  Esophageal cancer Neg Hx   . Rectal cancer Neg Hx   . Stomach cancer Neg Hx       Review of Systems  DATA OBTAINED: from patient- unable to participate f;lly nurse GENERAL:  no fevers, fatigue, appetite changes SKIN: No itching, or rash EYES: No eye pain, redness, discharge EARS: No earache, tinnitus, change in hearing NOSE: No congestion, drainage or bleeding  MOUTH/THROAT: No mouth or tooth pain, No sore throat RESPIRATORY: No cough, wheezing, SOB CARDIAC: No chest pain, palpitations, lower extremity edema  GI: No abdominal pain, No N/V/D or constipation, No heartburn or reflux  GU: No dysuria, frequency or urgency, or incontinence  MUSCULOSKELETAL: No unrelieved bone/joint pain NEUROLOGIC: No headache, dizziness or new focal weakness PSYCHIATRIC: No c/o anxiety or sadness   Vitals:   04/27/16 1059  BP: 112/61  Pulse: 67  Resp: 20  Temp: 97.5 F (36.4 C)    SpO2 Readings from Last 1 Encounters:  04/27/16 94%   Body mass index is 35.88 kg/m.     Physical Exam  GENERAL APPEARANCE: Alert,min  conversant,  No acute distress.  SKIN: bruising B heels-in boots HEAD: Normocephalic, atraumatic  EYES: Conjunctiva/lids clear. Pupils round, reactive. EOMs intact.  EARS: External exam WNL, canals clear. Hearing  grossly normal.  NOSE: No deformity or discharge.  MOUTH/THROAT: Lips w/o lesions  RESPIRATORY: Breathing is even, unlabored. Lung sounds are clear   CARDIOVASCULAR: Heart RRR no murmurs, rubs or gallops. trace peripheral edema.   GASTROINTESTINAL: Abdomen is soft, non-tender, not distended w/ normal bowel sounds. GENITOURINARY: Bladder non tender, not distended  MUSCULOSKELETAL: No abnormal joints or musculature NEUROLOGIC:  Cranial nerves 2-12 grossly intact; R side weakness and neglect PSYCHIATRIC: Mood and affect odd, no behavioral issues  Patient Active Problem List   Diagnosis Date Noted  . Urinary retention   . Accidental fall from chair   . FUO (fever of unknown origin)   . Cognitive and behavioral changes   . Cerebrovascular accident (CVA) due to embolism of left posterior cerebral artery ([place])   . Lethargy   . Acute on chronic renal insufficiency   . Low back pain   . Leukocytosis   . Labile blood glucose   . Hypoglycemia due to insulin   . UTI due to Klebsiella species   . Deep tissue injury   . Renovascular hypertension   . History of DVT (deep vein thrombosis)   . Coronary artery disease involving native coronary artery of native heart without angina pectoris   . Morbid obesity ([place])   . Stage 2 chronic kidney disease   . Hypokalemia   . Acute blood loss anemia   . Embolic stroke involving posterior cerebral artery ([place]) 04/03/2016  . Brainstem stroke ([place]) 04/01/2016  . Abnormality of gait and mobility   . Cerebral infarction due to thrombosis of other cerebral artery ([place])   . Right arm numbness   . Left sided numbness 03/29/2016  . TIA (transient ischemic attack) 03/29/2016  . Hypertensive urgency 03/29/2016  . Sepsis ([place]) 09/10/2013  . DVT (deep venous thrombosis) ([place]) 01/01/2013  . Rash and nonspecific skin eruption 12/29/2012  . Cellulitis and abscess of leg 12/29/2012  . Diabetes type 2, uncontrolled ([place]) 12/29/2012  . Hypertension   .  Cardiomyopathy ([place])   . CHF (congestive heart failure) ([place])   . Peripheral neuropathy ([place])   . Special screening for malignant neoplasms, colon 12/09/2012      Labs reviewed: Basic Metabolic Panel:  Component Value Date/Time   NA 146 (H) 04/25/2016 0357   NA 146 04/25/2016   K 3.5 04/25/2016 0357   CL 109 04/25/2016 0357   CO2 26 04/25/2016 0357   GLUCOSE 180 (H) 04/25/2016 0357   BUN 23 (H) 04/25/2016 0357   BUN 23 (A) 04/25/2016   CREATININE 1.33 (H) 04/25/2016 0357   CALCIUM 9.4 04/25/2016 0357   PROT 6.9 04/04/2016 0516   ALBUMIN 2.9 (L) 04/04/2016 0516   AST 17 04/04/2016 0516   ALT 12 (L) 04/04/2016 0516   ALKPHOS 43 04/04/2016 0516   BILITOT 0.5 04/04/2016 0516   GFRNONAA 57 (L) 04/25/2016 0357   GFRAA >60 04/25/2016 0357     Recent Labs  04/01/16 0221  04/18/16 0441 04/23/16 04/23/16 1151 04/25/16 04/25/16 0357  NA 139  < > 145 146 146* 146 146*  K 3.5  < > 3.8 4.3 4.3  --  3.5  CL 105  < > 105  --  109  --  109  CO2 26  < > 28  --  23  --  26  GLUCOSE 240*  < > 196*  --  309*  --  180*  BUN 18  < > 36* 32* 32* 23* 23*  CREATININE 1.49*  < > 1.43* 1.5* 1.46* 1.3 1.33*  CALCIUM 9.0  < > 9.8  --  9.8  --  9.4  MG 1.9  --   --   --   --   --   --   < > = values in this interval not displayed. Liver Function Tests:  Recent Labs  03/28/16 2113 04/04/16 0516  AST 24 17  ALT 17 12*  ALKPHOS 61 43  BILITOT 0.6 0.5  PROT 7.7 6.9  ALBUMIN 3.8 2.9*   No results for input(s): LIPASE, AMYLASE in the last 8760 hours. No results for input(s): AMMONIA in the last 8760 hours. CBC:  Recent Labs  04/13/16 0402 04/18/16 0441 04/23/16 04/23/16 1151  WBC 9.7 8.4 6.7 6.7  NEUTROABS 5.9 4.9  --  4.7  HGB 12.0* 12.6*  --  13.7  HCT 37.0* 40.0  --  43.7  MCV 88.9 90.9  --  92.4  PLT 321 344  --  237   Lipid  Recent Labs  03/29/16 0531  CHOL 235*  HDL 36*  LDLCALC 167*  TRIG 160*    Cardiac Enzymes:  Recent Labs  03/29/16 0931  TROPONINI  0.14*   BNP: No results for input(s): BNP in the last 8760 hours. No results found for: [facility] Lab Results  Component Value Date   HGBA1C 11.0 (H) 03/29/2016   No results found for: TSH No results found for: VITAMINB12 No results found for: FOLATE No results found for: IRON, TIBC, FERRITIN  Imaging and Procedures obtained prior to SNF admission: No results found.   Not all labs, radiology exams or other studies done during hospitalization come through on my EPIC note; however they are reviewed by me.    Assessment and Plan  EMBOLIC CVA INVOLVING L PCA -  MRI brain done revealing 9 mm acute ischemic infarct within left paramedian pons.Therapy evaluations done revealing right sided weakness, chronic visual deficits,  and ataxic gait;  R side inattention; cognitive defecits; PLACED ON XARELTO AS PROPHYLAXIS;pt transfer to rehab where he made some advances SNF - admitted for continues OT/PT; cont xarelto  CKD/HYPERNATREMIA/HYPOKALEMIA/DYSPHAGIA - resolved; hypernatremia prob 2/2 glucerna so changed to nephro and on nectar liquids SNF -  cont nectar; f/u BMP  PSEUDOMOINAS/ECTEROCOCCUS UTI/ urinary retention - tx with 7 days cipro; accompanired by urinary retention tx with in and out caths SNF - have placed foley and having trial today off; will follow; cont bethanocol 10 mg TID and flomax 0.4 mg daily  HTN SNF - well controlled now on multiple agents- norvasc 10 mg daily, coreg 25 mg BID, hygroton 25 mg daily, clonidine 0.3  TID, lasix 80 mg  bidil 20-37.5 2 po BID, and lsartan 100 mg daily  DM2 SNF- cont 70/30 45u in am and 30 u in pm; 8 u SSI with each meal; monitoring ac  HLD SNF - NOT STATED AS UNCONTROLLED ;CONT LIPITOR 40 MG DAILY  DEPRESSION  SNF- cont prozac 10 mg daily anf remeron 7.5 mg   Time spent . 45 min;> 50% of time with patient was spent reviewing records, labs, tests and studies, counseling and developing plan of care Webb Silversmith D. Sheppard Coil, MD

## 2016-04-28 ENCOUNTER — Encounter: Payer: Self-pay | Admitting: Internal Medicine

## 2016-04-28 DIAGNOSIS — B952 Enterococcus as the cause of diseases classified elsewhere: Secondary | ICD-10-CM | POA: Insufficient documentation

## 2016-04-28 DIAGNOSIS — E1169 Type 2 diabetes mellitus with other specified complication: Secondary | ICD-10-CM | POA: Insufficient documentation

## 2016-04-28 DIAGNOSIS — N183 Chronic kidney disease, stage 3 unspecified: Secondary | ICD-10-CM | POA: Insufficient documentation

## 2016-04-28 DIAGNOSIS — E785 Hyperlipidemia, unspecified: Secondary | ICD-10-CM

## 2016-04-28 DIAGNOSIS — N3080 Other cystitis without hematuria: Secondary | ICD-10-CM

## 2016-04-28 DIAGNOSIS — F329 Major depressive disorder, single episode, unspecified: Secondary | ICD-10-CM | POA: Insufficient documentation

## 2016-04-28 DIAGNOSIS — E870 Hyperosmolality and hypernatremia: Secondary | ICD-10-CM | POA: Insufficient documentation

## 2016-04-28 DIAGNOSIS — B965 Pseudomonas (aeruginosa) (mallei) (pseudomallei) as the cause of diseases classified elsewhere: Secondary | ICD-10-CM | POA: Insufficient documentation

## 2016-04-28 DIAGNOSIS — I69391 Dysphagia following cerebral infarction: Secondary | ICD-10-CM | POA: Insufficient documentation

## 2016-04-28 DIAGNOSIS — N390 Urinary tract infection, site not specified: Secondary | ICD-10-CM

## 2016-04-28 DIAGNOSIS — F32A Depression, unspecified: Secondary | ICD-10-CM | POA: Insufficient documentation

## 2016-04-30 LAB — CBC AND DIFFERENTIAL
HEMATOCRIT: 44 % (ref 41–53)
HEMOGLOBIN: 14.2 g/dL (ref 13.5–17.5)
PLATELETS: 203 10*3/uL (ref 150–399)
WBC: 5.1 10^3/mL

## 2016-04-30 LAB — BASIC METABOLIC PANEL
BUN: 36 mg/dL — AB (ref 4–21)
Creatinine: 1.5 mg/dL — AB (ref 0.6–1.3)
Glucose: 261 mg/dL
Potassium: 4 mmol/L (ref 3.4–5.3)
SODIUM: 143 mmol/L (ref 137–147)

## 2016-05-04 ENCOUNTER — Encounter: Payer: Self-pay | Admitting: Internal Medicine

## 2016-05-04 ENCOUNTER — Non-Acute Institutional Stay (SKILLED_NURSING_FACILITY): Payer: Medicare Other | Admitting: Internal Medicine

## 2016-05-04 ENCOUNTER — Other Ambulatory Visit: Payer: Self-pay | Admitting: *Deleted

## 2016-05-04 DIAGNOSIS — E1065 Type 1 diabetes mellitus with hyperglycemia: Secondary | ICD-10-CM | POA: Diagnosis not present

## 2016-05-04 NOTE — Progress Notes (Signed)
Location:  [facility] Room Number: 110P Place of Service:  SNF (31)  [name]. James Coil, MD  Patient Care Team: Maury Dus, MD as PCP - General [facility] Medicine)  Extended Emergency Contact Information Primary Emergency Contact: Terry,Nicole Address: [address]          [place] [address] Johnnette Litter of [place] Phone: [phone] Relation: Daughter Secondary Emergency Contact: Omer [place] of [place] Phone: [phone] Mobile Phone: [phone] Relation: Daughter    Allergies: Iohexol and Ivp dye [iodinated diagnostic agents]  Chief Complaint  Patient presents with  . Acute Visit    HPI: Patient is 61 y.o. male who is being seen acutely to look at his insulin regimen. Pt is not eating much, yet his BS have ben running in the 300-400's. Pt is having no symptoms.   Past Medical History:  Diagnosis Date  . Adrenal insufficiency ([place])   . Allergy   . Arthritis   . Blind left eye   . Carpal tunnel syndrome   . Cataract    bil removed  . CHF (congestive heart failure) ([place])   . Diabetes mellitus without complication ([place])   . ED (erectile dysfunction)   . Gout   . Herpes   . Hypercholesterolemia   . Hypertension   . MI (myocardial infarction)   . Peripheral neuropathy ([place])   . Pneumonia   . PVD (peripheral vascular disease) ([place])     Past Surgical History:  Procedure Laterality Date  . CHOLECYSTECTOMY    . CORONARY ANGIOPLASTY WITH STENT PLACEMENT  2007   two 2.5  x 13 mm Cypher stents to the RCA  . dilatera cataracts removed    . right knee surgary      Allergies as of 05/04/2016      Reactions   Iohexol Hives   Ivp Dye [iodinated Diagnostic Agents] Hives      Medication List       Accurate as of 05/04/16 11:45 AM. Always use your most recent med list.          acetaminophen 160 MG/5ML suspension Commonly known as:  TYLENOL Take 650 mg by mouth every 4 (four) hours as needed  for mild pain. 20.3 ml ( 650 mg total)   amLODipine 10 MG tablet Commonly known as:  NORVASC Take 1 tablet (10 mg total) by mouth daily.   atorvastatin 40 MG tablet Commonly known as:  LIPITOR Take 1 tablet (40 mg total) by mouth daily at 6 PM.   bethanechol 10 MG tablet Commonly known as:  URECHOLINE Take 1 tablet (10 mg total) by mouth 3 (three) times daily.   carvedilol 25 MG tablet Commonly known as:  COREG Take 1 tablet (25 mg total) by mouth 2 (two) times daily with a meal.   chlorthalidone 25 MG tablet Commonly known as:  HYGROTON Take 3 tablets (75 mg total) by mouth daily.   cloNIDine 0.3 MG tablet Commonly known as:  CATAPRES Take 1 tablet (0.3 mg total) by mouth 3 (three) times daily.   FLUoxetine 10 MG capsule Commonly known as:  PROZAC Take 1 capsule (10 mg total) by mouth daily.   furosemide 80 MG tablet Commonly known as:  LASIX Take 80 mg by mouth daily.   eucerin cream Apply 1 application topically 2 (two) times daily. To both legs   hydrocerin Crea Apply 1 application topically 2 (two) times daily.   hydrocortisone 2.5 % rectal cream Commonly known  as:  ANUSOL-HC Place rectally 2 (two) times daily as needed for hemorrhoids or itching.   insulin aspart 100 UNIT/ML injection Commonly known as:  novoLOG Inject 8 Units into the skin 3 (three) times daily with meals.   insulin aspart protamine- aspart (70-30) 100 UNIT/ML injection Commonly known as:  NOVOLOG MIX 70/30 Inject 0.3 mLs (30 Units total) into the skin daily with supper.   insulin aspart protamine- aspart (70-30) 100 UNIT/ML injection Commonly known as:  NOVOLOG MIX 70/30 Inject 0.45 mLs (45 Units total) into the skin daily with breakfast.   isosorbide-hydrALAZINE 20-37.5 MG tablet Commonly known as:  BIDIL Take 2 tablets by mouth 3 (three) times daily.   lidocaine 2 % jelly Commonly known as:  XYLOCAINE Apply topically as needed (Use with in and out catheter).   losartan 100 MG  tablet Commonly known as:  COZAAR Take 1 tablet (100 mg total) by mouth daily.   mirtazapine 7.5 MG tablet Commonly known as:  REMERON Take 1 tablet (7.5 mg total) by mouth at bedtime.   polyethylene glycol packet Commonly known as:  MIRALAX / GLYCOLAX Take 17 g by mouth daily.   potassium chloride 20 MEQ/15ML (10%) Soln Take 30 mLs (40 mEq total) by mouth 2 (two) times daily.   rivaroxaban 20 MG Tabs tablet Commonly known as:  XARELTO Take 1 tablet (20 mg total) by mouth daily with supper.   senna-docusate 8.6-50 MG tablet Commonly known as:  Senokot-S Take 1 tablet by mouth daily.   tamsulosin 0.4 MG Caps capsule Commonly known as:  FLOMAX Take 1 capsule (0.4 mg total) by mouth daily after supper.       No orders of the defined types were placed in this encounter.   Immunization History  Administered Date(s) Administered  . PPD Test 04/25/2016    Social History  Substance Use Topics  . Smoking status: Never Smoker  . Smokeless tobacco: Never Used  . Alcohol use No    Review of Systems  UTO 2/2 dementia    Vitals:   05/04/16 1143  BP: 137/66  Pulse: 74  Resp: 16  Temp: 99 F (37.2 C)   Body mass index is 33.97 kg/m. Physical Exam  GENERAL APPEARANCE: Alert, min conversant, No acute distress  SKIN: No diaphoresis rash HEENT: Unremarkable RESPIRATORY: Breathing is even, unlabored. Lung sounds are clear   CARDIOVASCULAR: Heart RRR no murmurs, rubs or gallops. No peripheral edema  GASTROINTESTINAL: Abdomen is soft, non-tender, not distended w/ normal bowel sounds.  GENITOURINARY: Bladder non tender, not distended  MUSCULOSKELETAL: No abnormal joints or musculature NEUROLOGIC: Cranial nerves 2-12 grossly intact. Moves all extremities PSYCHIATRIC: Mood and affect with dementia, no behavioral issues  Patient Active Problem List   Diagnosis Date Noted  . CKD (chronic kidney disease) stage 3, GFR 30-59 ml/min 04/28/2016  . Hypernatremia 04/28/2016    . Dysphagia due to recent stroke 04/28/2016  . Cystitis due to Pseudomonas 04/28/2016  . Enterococcus UTI 04/28/2016  . Hyperlipidemia associated with type 2 diabetes mellitus ([place]) 04/28/2016  . Depression 04/28/2016  . Urinary retention   . Accidental fall from chair   . FUO (fever of unknown origin)   . Cognitive and behavioral changes   . Cerebrovascular accident (CVA) due to embolism of left posterior cerebral artery ([place])   . Lethargy   . Acute on chronic renal insufficiency   . Low back pain   . Leukocytosis   . Labile blood glucose   . Hypoglycemia due to  insulin   . UTI due to Klebsiella species   . Deep tissue injury   . Renovascular hypertension   . History of DVT (deep vein thrombosis)   . Coronary artery disease involving native coronary artery of native heart without angina pectoris   . Morbid obesity ([place])   . Stage 2 chronic kidney disease   . Hypokalemia   . Acute blood loss anemia   . Embolic stroke involving posterior cerebral artery ([place]) 04/03/2016  . Brainstem stroke ([place]) 04/01/2016  . Abnormality of gait and mobility   . Cerebral infarction due to thrombosis of other cerebral artery ([place])   . Right arm numbness   . Left sided numbness 03/29/2016  . TIA (transient ischemic attack) 03/29/2016  . Hypertensive urgency 03/29/2016  . Sepsis ([place]) 09/10/2013  . DVT (deep venous thrombosis) ([place]) 01/01/2013  . Rash and nonspecific skin eruption 12/29/2012  . Cellulitis and abscess of leg 12/29/2012  . Diabetes type 2, uncontrolled ([place]) 12/29/2012  . Hypertension   . Cardiomyopathy ([place])   . CHF (congestive heart failure) ([place])   . Peripheral neuropathy ([place])   . Special screening for malignant neoplasms, colon 12/09/2012    CMP     Component Value Date/Time   NA 143 04/30/2016   K 4.0 04/30/2016   CL 109 04/25/2016 0357   CO2 26 04/25/2016 0357   GLUCOSE 180 (H) 04/25/2016 0357   BUN 36 (A) 04/30/2016   CREATININE 1.5 (A) 04/30/2016    CREATININE 1.33 (H) 04/25/2016 0357   CALCIUM 9.4 04/25/2016 0357   PROT 6.9 04/04/2016 0516   ALBUMIN 2.9 (L) 04/04/2016 0516   AST 17 04/04/2016 0516   ALT 12 (L) 04/04/2016 0516   ALKPHOS 43 04/04/2016 0516   BILITOT 0.5 04/04/2016 0516   GFRNONAA 57 (L) 04/25/2016 0357   GFRAA >60 04/25/2016 0357    Recent Labs  04/01/16 0221  04/18/16 0441  04/23/16 1151 04/25/16 04/25/16 0357 04/30/16  NA 139  < > 145  < > 146* 146 146* 143  K 3.5  < > 3.8  < > 4.3  --  3.5 4.0  CL 105  < > 105  --  109  --  109  --   CO2 26  < > 28  --  23  --  26  --   GLUCOSE 240*  < > 196*  --  309*  --  180*  --   BUN 18  < > 36*  < > 32* 23* 23* 36*  CREATININE 1.49*  < > 1.43*  < > 1.46* 1.3 1.33* 1.5*  CALCIUM 9.0  < > 9.8  --  9.8  --  9.4  --   MG 1.9  --   --   --   --   --   --   --   < > = values in this interval not displayed.  Recent Labs  03/28/16 2113 04/04/16 0516  AST 24 17  ALT 17 12*  ALKPHOS 61 43  BILITOT 0.6 0.5  PROT 7.7 6.9  ALBUMIN 3.8 2.9*    Recent Labs  04/13/16 0402 04/18/16 0441 04/23/16 04/23/16 1151 04/30/16  WBC 9.7 8.4 6.7 6.7 5.1  NEUTROABS 5.9 4.9  --  4.7  --   HGB 12.0* 12.6*  --  13.7 14.2  HCT 37.0* 40.0  --  43.7 44  MCV 88.9 90.9  --  92.4  --   PLT 321 344  --  237 203    Recent Labs  03/29/16 0531  CHOL 235*  LDLCALC 167*  TRIG 160*   No results found for: MICROALBUR No results found for: TSH Lab Results  Component Value Date   HGBA1C 11.0 (H) 03/29/2016   Lab Results  Component Value Date   CHOL 235 (H) 03/29/2016   HDL 36 (L) 03/29/2016   LDLCALC 167 (H) 03/29/2016   TRIG 160 (H) 03/29/2016   CHOLHDL 6.5 03/29/2016    Significant Diagnostic Results in last 30 days:  Dg Shoulder Right  Result Date: 04/24/2016 CLINICAL DATA:  Pain after falling from chair. EXAM: RIGHT SHOULDER - 2+ VIEW COMPARISON:  None. FINDINGS: No acute fracture. No dislocations. Osteoarthritic joint space narrowing of the AC and glenohumeral joints.  Soft tissue calcification adjacent to greater tuberosity may reflect rotator cuff tendinopathy. Mild tug related muscle insertional change noted of the proximal humerus. IMPRESSION: 1. Osteoarthritis of the [facility] and glenohumeral joints. 2. No acute fracture or bone destruction. 3. Calcific rotator cuff tendinopathy. Electronically Signed   By: Ashley Royalty M.D.   On: 04/24/2016 19:32   Dg Elbow 2 Views Right  Result Date: 04/24/2016 CLINICAL DATA:  Pain after fall from chair EXAM: RIGHT ELBOW - 2 VIEW COMPARISON:  None. FINDINGS: Soft tissue swelling and induration posterior to the elbow joint without acute fracture or malalignment. No definite joint effusion. The lateral view is slightly oblique limiting assessment for joint effusions. IMPRESSION: Negative for acute fracture or malalignment. Dorsal soft tissue swelling of the distal arm and proximal forearm. Electronically Signed   By: Ashley Royalty M.D.   On: 04/24/2016 19:36   Ct Head Wo Contrast  Result Date: 04/24/2016 CLINICAL DATA:  Patient fell from chair.  Unwitnessed fall. EXAM: CT HEAD WITHOUT CONTRAST TECHNIQUE: Contiguous axial images were obtained from the base of the skull through the vertex without intravenous contrast. COMPARISON:  04/19/2016 CT FINDINGS: BRAIN: There is sulcal and ventricular prominence consistent with superficial and central atrophy. No intraparenchymal hemorrhage, mass effect nor midline shift. Periventricular and subcortical white matter hypodensities consistent with a moderate degree of chronic small vessel ischemic disease are identified. Chronic left pontine and bilateral basal ganglial lacunar infarcts. No acute large vascular territory infarcts. No abnormal extra-axial fluid collections. Basal cisterns are not effaced and midline. VASCULAR: Moderate calcific atherosclerosis of the carotid siphons. SKULL: No skull fracture. No significant scalp soft tissue swelling. SINUSES/ORBITS: The mastoid air-cells are clear. The  included paranasal sinuses are well-aerated.The included ocular globes and orbital contents are non-suspicious. Bilateral lens replacements surgeries. OTHER: None. IMPRESSION: Chronic stable moderate small vessel ischemic disease of periventricular and subcortical white matter. Chronic left pontine and bilateral basal ganglial lacunar infarcts. No acute intracranial abnormality nor fracture. Electronically Signed   By: Ashley Royalty M.D.   On: 04/24/2016 19:39   Ct Head Wo Contrast  Result Date: 04/19/2016 CLINICAL DATA:  61 y/o M; history of brainstem stroke for follow-up. EXAM: CT HEAD WITHOUT CONTRAST TECHNIQUE: Contiguous axial images were obtained from the base of the skull through the vertex without intravenous contrast. COMPARISON:  03/30/2016 MRI of the head. FINDINGS: Brain: No evidence of acute infarction, hemorrhage, hydrocephalus, extra-axial collection or mass lesion/mass effect. Foci of hypoattenuation in subcortical and periventricular white matter are stable and compatible with moderate chronic microvascular ischemic changes. Stable mild brain parenchymal volume loss. Stable chronic lacunar infarcts in the basal ganglia and left hemi pons. Slight increased hypoattenuation within the left hemi pons probably corresponds to infarct  on prior MRI. Vascular: Calcific atherosclerosis of cavernous and paraclinoid internal carotid arteries. Skull: Normal. Negative for fracture or focal lesion. Sinuses/Orbits: No acute finding. Small stable calcific focus within the left lateral globe and abnormal vitreous signal on prior MRIs likely representing chronic retinal detachment. Other: None. IMPRESSION: 1. No new acute intracranial abnormality. 2. Stable moderate chronic microvascular ischemic changes and mild brain parenchymal volume loss. Crown lacunar infarcts and pons and basal ganglia. 3. Slight increased hypoattenuation within left hemi pons probably corresponding to infarct on prior MRI. Electronically  Signed   By: Kristine Garbe M.D.   On: 04/19/2016 14:26   Dg Swallowing Func-speech Pathology  Result Date: 04/25/2016 Objective Swallowing Evaluation: Type of Study: MBS-Modified Barium Swallow Study Patient Details Name: HALDON CARLEY MRN: [id number] Date of Birth: Jan 22, 1956 Today's Date: 04/25/2016 Time: No Data Recorded-No Data Recorded No Data Recorded Past Medical History: Past Medical History: Diagnosis Date . Adrenal insufficiency ([place])  . Allergy  . Arthritis  . Blind left eye  . Carpal tunnel syndrome  . Cataract   bil removed . CHF (congestive heart failure) ([place])  . Diabetes mellitus without complication ([place])  . ED (erectile dysfunction)  . Gout  . Herpes  . Hypercholesterolemia  . Hypertension  . MI (myocardial infarction)  . Peripheral neuropathy ([place])  . Pneumonia  . PVD (peripheral vascular disease) ([place])  Past Surgical History: Past Surgical History: Procedure Laterality Date . CHOLECYSTECTOMY   . dilatera cataracts removed   . right knee surgary   . stents   HPI: Pt is a 61 y.o. male with PMH of diabetes mellitus type 2, hypertension, CHF, DVT presented to ER on 2/1 because of increasing numbness of the right upper and lower extremity. MRI showed no acute process; multiple old supra- and infratentorial small vessel infarcts, moderate to severe chronic small vessel ischemic disease. Pt passed RN swallow screen. Neuro exam showed no evidence of aphasia. Speech-language eval ordered as part of stroke workup. No Data Recorded Assessment / Plan / Recommendation CHL IP CLINICAL IMPRESSIONS 04/25/2016 Clinical Impression Pt presents with mild to moderate oral phase deficits c/b decreased bolus manipulation and decreased AP transfer resulting in increased oral prep times and peicemeal swallowing of dysphagia 2 textures. Pt with mild to moderate pharyngeal phase dysphagia c/b delayed swallow to pyriform sinuses for all liquid consistencies assessed - nectar thick liquids, honey thick liquids  and thin liquids. Pt with swallow initiation at vallecula for trials of dysphagia 2. No penetration or aspiration observed. However, at bedside pt with significant coughing and vocal changes on consistent trials of ice chips, thin liquids via spoon and cup. Of note, pt with neck extension during Modified to aid in AP transfer of all consistencies and pt continues to present with decreased mentation. Given this information, pt is at mild aspiration risk and recommend conversative diet of dysphagia 2 with necatr thick liquids with supervised trials of thin liquids at bedside following oral care.   SLP Visit Diagnosis Dysphagia, oropharyngeal phase (R13.12) Attention and concentration deficit following -- Frontal lobe and executive function deficit following -- Impact on safety and function Mild aspiration risk   CHL IP TREATMENT RECOMMENDATION 04/25/2016 Treatment Recommendations Therapy as outlined in treatment plan below   Prognosis 04/25/2016 Prognosis for Safe Diet Advancement Guarded Barriers to Reach Goals Cognitive deficits;Motivation;Time post onset;Severity of deficits Barriers/Prognosis Comment -- CHL IP DIET RECOMMENDATION 04/25/2016 SLP Diet Recommendations Dysphagia 2 (Fine chop) solids;Nectar thick liquid Liquid Administration via Cup Medication Administration Crushed with  puree Compensations Minimize environmental distractions;Slow rate;Small sips/bites Postural Changes Seated upright at 90 degrees   CHL IP OTHER RECOMMENDATIONS 04/25/2016 Recommended Consults -- Oral Care Recommendations Oral care BID Other Recommendations Order thickener from pharmacy;Prohibited food (jello, ice cream, thin soups);Remove water pitcher   CHL IP FOLLOW UP RECOMMENDATIONS 04/25/2016 Follow up Recommendations Skilled Nursing facility   No flowsheet data found.     CHL IP ORAL PHASE 04/25/2016 Oral Phase -- Oral - Pudding Teaspoon -- Oral - Pudding Cup -- Oral - Honey Teaspoon -- Oral - Honey Cup -- Oral - Nectar Teaspoon --  Oral - Nectar Cup -- Oral - Nectar Straw -- Oral - Thin Teaspoon -- Oral - Thin Cup -- Oral - Thin Straw -- Oral - Puree -- Oral - Mech Soft -- Oral - Regular -- Oral - Multi-Consistency -- Oral - Pill -- Oral Phase - Comment Pt with neck extension to aid in AP transfer of all boluses assessed  CHL IP PHARYNGEAL PHASE 04/25/2016 Pharyngeal Phase -- Pharyngeal- Pudding Teaspoon -- Pharyngeal -- Pharyngeal- Pudding Cup -- Pharyngeal -- Pharyngeal- Honey Teaspoon Reduced anterior laryngeal mobility;Reduced laryngeal elevation;Reduced tongue base retraction;Delayed swallow initiation-pyriform sinuses;Pharyngeal residue - valleculae Pharyngeal -- Pharyngeal- Honey Cup -- Pharyngeal -- Pharyngeal- Nectar Teaspoon Delayed swallow initiation-pyriform sinuses;Reduced anterior laryngeal mobility;Reduced laryngeal elevation;Lateral channel residue Pharyngeal -- Pharyngeal- Nectar Cup Delayed swallow initiation-pyriform sinuses;Reduced anterior laryngeal mobility;Reduced laryngeal elevation;Reduced tongue base retraction;Lateral channel residue Pharyngeal -- Pharyngeal- Nectar Straw -- Pharyngeal -- Pharyngeal- Thin Teaspoon Delayed swallow initiation-pyriform sinuses;Reduced anterior laryngeal mobility;Reduced laryngeal elevation;Reduced tongue base retraction;Lateral channel residue Pharyngeal -- Pharyngeal- Thin Cup -- Pharyngeal -- Pharyngeal- Thin Straw -- Pharyngeal -- Pharyngeal- Puree -- Pharyngeal -- Pharyngeal- Mechanical Soft Delayed swallow initiation-vallecula;Reduced laryngeal elevation;Reduced anterior laryngeal mobility;Reduced tongue base retraction;Lateral channel residue Pharyngeal -- Pharyngeal- Regular -- Pharyngeal -- Pharyngeal- Multi-consistency -- Pharyngeal -- Pharyngeal- Pill -- Pharyngeal -- Pharyngeal Comment --  CHL IP CERVICAL ESOPHAGEAL PHASE 04/25/2016 Cervical Esophageal Phase WFL Pudding Teaspoon -- Pudding Cup -- Honey Teaspoon -- Honey Cup -- Nectar Teaspoon -- Nectar Cup -- Nectar Straw --  Thin Teaspoon -- Thin Cup -- Thin Straw -- Puree -- Mechanical Soft -- Regular -- Multi-consistency -- Pill -- Cervical Esophageal Comment -- No flowsheet data found. Happi Overton 04/25/2016, 2:04 PM              Dg Hip Unilat With Pelvis 2-3 Views Right  Result Date: 04/24/2016 CLINICAL DATA:  Right hip pain EXAM: DG HIP (WITH OR WITHOUT PELVIS) 2-3V RIGHT COMPARISON:  None. FINDINGS: There is no evidence of hip fracture or dislocation. Hip joints are maintained bilaterally without significant joint space narrowing. The bony pelvis appears intact. There is sclerosis along the expected location of the L5-S1 facets consistent with lumbar facet arthropathy. SI joints and pubic symphysis are maintained. Ileofemoral and branch vessel vascular calcifications are noted bilaterally. IMPRESSION: No acute osseous abnormality identified of the bony pelvis and either hip. L5-S1 facet arthropathy. Vascular calcifications as above described. Electronically Signed   By: Ashley Royalty M.D.   On: 04/24/2016 19:35    Assessment and Plan  HYPERGLYCEMIA/DM2 - pt is currently on 70/30, 45 u in am and 30 u q pm with 8 of novolog with each meal.; after evaluating as many BS as we have I will change pt to toujeo 65u qpm and 12 u with every meal, d/c all other insulins. Will monitor and tweak as needed.      [name]. James Coil, MD

## 2016-05-04 NOTE — Patient Outreach (Signed)
Call to facility, spoke with [facility] in admissions as no SW available at this time.  She reports patient family is applying for LTC Medicaid, plan is to remain LTC at facility. Requested that they let RNCM know if plans change and patient needs [facility] care management services upon discharge.  Plan to sign off at this time, can be consulted again if discharge plans change.  Royetta Crochet. Laymond Purser, RN, BSN, [place] [phone]) Business Cell  [phone]) Toll Free Office

## 2016-05-07 ENCOUNTER — Ambulatory Visit: Payer: Medicare Other | Admitting: Physician Assistant

## 2016-05-08 LAB — BASIC METABOLIC PANEL
BUN: 61 mg/dL — AB (ref 4–21)
CREATININE: 2.5 mg/dL — AB (ref 0.6–1.3)
POTASSIUM: 4.2 mmol/L (ref 3.4–5.3)
Sodium: 144 mmol/L (ref 137–147)

## 2016-05-09 ENCOUNTER — Non-Acute Institutional Stay (SKILLED_NURSING_FACILITY): Payer: Medicare Other | Admitting: Internal Medicine

## 2016-05-09 DIAGNOSIS — Z710 Person encountering health services to consult on behalf of another person: Secondary | ICD-10-CM

## 2016-05-09 DIAGNOSIS — I63432 Cerebral infarction due to embolism of left posterior cerebral artery: Secondary | ICD-10-CM | POA: Diagnosis not present

## 2016-05-10 ENCOUNTER — Encounter (HOSPITAL_COMMUNITY): Payer: Self-pay | Admitting: Emergency Medicine

## 2016-05-10 ENCOUNTER — Observation Stay (HOSPITAL_COMMUNITY): Payer: Medicare Other

## 2016-05-10 ENCOUNTER — Inpatient Hospital Stay (HOSPITAL_COMMUNITY)
Admission: EM | Admit: 2016-05-10 | Discharge: 2016-05-12 | DRG: 189 | Disposition: A | Discharge status: Skilled Nursing Facility | Payer: Medicare Other | Attending: Internal Medicine | Admitting: Internal Medicine

## 2016-05-10 ENCOUNTER — Emergency Department (HOSPITAL_COMMUNITY): Payer: Medicare Other

## 2016-05-10 DIAGNOSIS — R918 Other nonspecific abnormal finding of lung field: Secondary | ICD-10-CM | POA: Diagnosis not present

## 2016-05-10 DIAGNOSIS — E2740 Unspecified adrenocortical insufficiency: Secondary | ICD-10-CM | POA: Diagnosis not present

## 2016-05-10 DIAGNOSIS — K5909 Other constipation: Secondary | ICD-10-CM | POA: Diagnosis present

## 2016-05-10 DIAGNOSIS — E1165 Type 2 diabetes mellitus with hyperglycemia: Secondary | ICD-10-CM | POA: Diagnosis present

## 2016-05-10 DIAGNOSIS — E1142 Type 2 diabetes mellitus with diabetic polyneuropathy: Secondary | ICD-10-CM | POA: Diagnosis present

## 2016-05-10 DIAGNOSIS — I5042 Chronic combined systolic (congestive) and diastolic (congestive) heart failure: Secondary | ICD-10-CM | POA: Diagnosis present

## 2016-05-10 DIAGNOSIS — R7989 Other specified abnormal findings of blood chemistry: Secondary | ICD-10-CM | POA: Diagnosis not present

## 2016-05-10 DIAGNOSIS — Z794 Long term (current) use of insulin: Secondary | ICD-10-CM

## 2016-05-10 DIAGNOSIS — I69320 Aphasia following cerebral infarction: Secondary | ICD-10-CM

## 2016-05-10 DIAGNOSIS — J9601 Acute respiratory failure with hypoxia: Secondary | ICD-10-CM | POA: Diagnosis not present

## 2016-05-10 DIAGNOSIS — N179 Acute kidney failure, unspecified: Secondary | ICD-10-CM | POA: Diagnosis present

## 2016-05-10 DIAGNOSIS — I255 Ischemic cardiomyopathy: Secondary | ICD-10-CM | POA: Diagnosis present

## 2016-05-10 DIAGNOSIS — Z86718 Personal history of other venous thrombosis and embolism: Secondary | ICD-10-CM

## 2016-05-10 DIAGNOSIS — I2510 Atherosclerotic heart disease of native coronary artery without angina pectoris: Secondary | ICD-10-CM | POA: Diagnosis present

## 2016-05-10 DIAGNOSIS — I248 Other forms of acute ischemic heart disease: Secondary | ICD-10-CM | POA: Diagnosis not present

## 2016-05-10 DIAGNOSIS — L89312 Pressure ulcer of right buttock, stage 2: Secondary | ICD-10-CM | POA: Diagnosis present

## 2016-05-10 DIAGNOSIS — I130 Hypertensive heart and chronic kidney disease with heart failure and stage 1 through stage 4 chronic kidney disease, or unspecified chronic kidney disease: Secondary | ICD-10-CM | POA: Diagnosis not present

## 2016-05-10 DIAGNOSIS — R339 Retention of urine, unspecified: Secondary | ICD-10-CM | POA: Diagnosis present

## 2016-05-10 DIAGNOSIS — I252 Old myocardial infarction: Secondary | ICD-10-CM

## 2016-05-10 DIAGNOSIS — I150 Renovascular hypertension: Secondary | ICD-10-CM

## 2016-05-10 DIAGNOSIS — H5462 Unqualified visual loss, left eye, normal vision right eye: Secondary | ICD-10-CM | POA: Diagnosis present

## 2016-05-10 DIAGNOSIS — Z79899 Other long term (current) drug therapy: Secondary | ICD-10-CM

## 2016-05-10 DIAGNOSIS — Z955 Presence of coronary angioplasty implant and graft: Secondary | ICD-10-CM

## 2016-05-10 DIAGNOSIS — I517 Cardiomegaly: Secondary | ICD-10-CM | POA: Diagnosis not present

## 2016-05-10 DIAGNOSIS — I10 Essential (primary) hypertension: Secondary | ICD-10-CM | POA: Diagnosis present

## 2016-05-10 DIAGNOSIS — IMO0002 Reserved for concepts with insufficient information to code with codable children: Secondary | ICD-10-CM | POA: Diagnosis present

## 2016-05-10 DIAGNOSIS — Z7901 Long term (current) use of anticoagulants: Secondary | ICD-10-CM

## 2016-05-10 DIAGNOSIS — E1159 Type 2 diabetes mellitus with other circulatory complications: Secondary | ICD-10-CM

## 2016-05-10 DIAGNOSIS — E785 Hyperlipidemia, unspecified: Secondary | ICD-10-CM | POA: Diagnosis present

## 2016-05-10 DIAGNOSIS — R778 Other specified abnormalities of plasma proteins: Secondary | ICD-10-CM

## 2016-05-10 DIAGNOSIS — E1151 Type 2 diabetes mellitus with diabetic peripheral angiopathy without gangrene: Secondary | ICD-10-CM | POA: Diagnosis present

## 2016-05-10 DIAGNOSIS — R0902 Hypoxemia: Secondary | ICD-10-CM | POA: Diagnosis not present

## 2016-05-10 DIAGNOSIS — R112 Nausea with vomiting, unspecified: Secondary | ICD-10-CM | POA: Diagnosis not present

## 2016-05-10 DIAGNOSIS — L89152 Pressure ulcer of sacral region, stage 2: Secondary | ICD-10-CM | POA: Diagnosis present

## 2016-05-10 DIAGNOSIS — F329 Major depressive disorder, single episode, unspecified: Secondary | ICD-10-CM | POA: Diagnosis present

## 2016-05-10 DIAGNOSIS — L89300 Pressure ulcer of unspecified buttock, unstageable: Secondary | ICD-10-CM | POA: Insufficient documentation

## 2016-05-10 DIAGNOSIS — N183 Chronic kidney disease, stage 3 (moderate): Secondary | ICD-10-CM | POA: Diagnosis present

## 2016-05-10 DIAGNOSIS — D649 Anemia, unspecified: Secondary | ICD-10-CM | POA: Diagnosis present

## 2016-05-10 DIAGNOSIS — L89322 Pressure ulcer of left buttock, stage 2: Secondary | ICD-10-CM | POA: Diagnosis present

## 2016-05-10 DIAGNOSIS — E1122 Type 2 diabetes mellitus with diabetic chronic kidney disease: Secondary | ICD-10-CM | POA: Diagnosis present

## 2016-05-10 HISTORY — DX: Nausea with vomiting, unspecified: R11.2

## 2016-05-10 LAB — CBC WITH DIFFERENTIAL/PLATELET
BASOS ABS: 0 10*3/uL (ref 0.0–0.1)
BASOS PCT: 0 %
Eosinophils Absolute: 0.1 10*3/uL (ref 0.0–0.7)
Eosinophils Relative: 1 %
HEMATOCRIT: 39.1 % (ref 39.0–52.0)
Hemoglobin: 12.6 g/dL — ABNORMAL LOW (ref 13.0–17.0)
Lymphocytes Relative: 18 %
Lymphs Abs: 1.9 10*3/uL (ref 0.7–4.0)
MCH: 29 pg (ref 26.0–34.0)
MCHC: 32.2 g/dL (ref 30.0–36.0)
MCV: 89.9 fL (ref 78.0–100.0)
Monocytes Absolute: 1.1 10*3/uL — ABNORMAL HIGH (ref 0.1–1.0)
Monocytes Relative: 10 %
NEUTROS ABS: 7.4 10*3/uL (ref 1.7–7.7)
NEUTROS PCT: 71 %
Platelets: 253 10*3/uL (ref 150–400)
RBC: 4.35 MIL/uL (ref 4.22–5.81)
RDW: 12.7 % (ref 11.5–15.5)
WBC: 10.5 10*3/uL (ref 4.0–10.5)

## 2016-05-10 LAB — CBC AND DIFFERENTIAL
HCT: 39 % — AB (ref 41–53)
HEMOGLOBIN: 12.6 g/dL — AB (ref 13.5–17.5)
Platelets: 253 10*3/uL (ref 150–399)
WBC: 10.5 10^3/mL

## 2016-05-10 LAB — TROPONIN I
TROPONIN I: 0.09 ng/mL — AB (ref <0.03)
TROPONIN I: 0.08 ng/mL — AB (ref <0.03)
Troponin I: 0.08 ng/mL (ref <0.03)
Troponin I: 0.1 ng/mL (ref <0.03)

## 2016-05-10 LAB — COMPREHENSIVE METABOLIC PANEL
ALT: 138 U/L — ABNORMAL HIGH (ref 17–63)
AST: 131 U/L — ABNORMAL HIGH (ref 15–41)
Albumin: 2.7 g/dL — ABNORMAL LOW (ref 3.5–5.0)
Alkaline Phosphatase: 70 U/L (ref 38–126)
Anion gap: 10 (ref 5–15)
BUN: 65 mg/dL — ABNORMAL HIGH (ref 6–20)
CHLORIDE: 104 mmol/L (ref 101–111)
CO2: 27 mmol/L (ref 22–32)
Calcium: 8.6 mg/dL — ABNORMAL LOW (ref 8.9–10.3)
Creatinine, Ser: 2.05 mg/dL — ABNORMAL HIGH (ref 0.61–1.24)
GFR, EST AFRICAN AMERICAN: 39 mL/min — AB (ref >60)
GFR, EST NON AFRICAN AMERICAN: 34 mL/min — AB (ref >60)
Glucose, Bld: 148 mg/dL — ABNORMAL HIGH (ref 65–99)
POTASSIUM: 3.8 mmol/L (ref 3.5–5.1)
Sodium: 141 mmol/L (ref 135–145)
Total Bilirubin: 0.5 mg/dL (ref 0.3–1.2)
Total Protein: 8.1 g/dL (ref 6.5–8.1)

## 2016-05-10 LAB — URINALYSIS, ROUTINE W REFLEX MICROSCOPIC
Bilirubin Urine: NEGATIVE
GLUCOSE, UA: NEGATIVE mg/dL
Hgb urine dipstick: NEGATIVE
KETONES UR: NEGATIVE mg/dL
LEUKOCYTES UA: NEGATIVE
NITRITE: NEGATIVE
PROTEIN: NEGATIVE mg/dL
Specific Gravity, Urine: 1.011 (ref 1.005–1.030)
pH: 5 (ref 5.0–8.0)

## 2016-05-10 LAB — BRAIN NATRIURETIC PEPTIDE: B Natriuretic Peptide: 53.8 pg/mL (ref 0.0–100.0)

## 2016-05-10 LAB — BASIC METABOLIC PANEL
BUN: 65 mg/dL — AB (ref 4–21)
Creatinine: 2.1 mg/dL — AB (ref 0.6–1.3)
Glucose: 148 mg/dL
Potassium: 3.8 mmol/L (ref 3.4–5.3)
SODIUM: 141 mmol/L (ref 137–147)

## 2016-05-10 LAB — PROTIME-INR
INR: 1.21
Prothrombin Time: 15.4 seconds — ABNORMAL HIGH (ref 11.4–15.2)

## 2016-05-10 LAB — HEPATIC FUNCTION PANEL: BILIRUBIN, TOTAL: 0.5 mg/dL

## 2016-05-10 LAB — GLUCOSE, CAPILLARY: GLUCOSE-CAPILLARY: 239 mg/dL — AB (ref 65–99)

## 2016-05-10 LAB — LIPASE, BLOOD: LIPASE: 27 U/L (ref 11–51)

## 2016-05-10 LAB — CBG MONITORING, ED: GLUCOSE-CAPILLARY: 134 mg/dL — AB (ref 65–99)

## 2016-05-10 MED ORDER — TAMSULOSIN HCL 0.4 MG PO CAPS
0.4000 mg | ORAL_CAPSULE | Freq: Every day | ORAL | Status: DC
  Administered 2016-05-10 – 2016-05-11 (×2): 0.4 mg via ORAL
  Filled 2016-05-10 (×2): qty 1

## 2016-05-10 MED ORDER — SODIUM CHLORIDE 0.9 % IV BOLUS (SEPSIS)
500.0000 mL | Freq: Once | INTRAVENOUS | Status: AC
  Administered 2016-05-10: 500 mL via INTRAVENOUS

## 2016-05-10 MED ORDER — MORPHINE SULFATE (PF) 4 MG/ML IV SOLN: 2.0000 mg | INTRAVENOUS | Status: DC | PRN

## 2016-05-10 MED ORDER — LOSARTAN POTASSIUM 50 MG PO TABS
100.0000 mg | ORAL_TABLET | Freq: Every day | ORAL | Status: DC
  Administered 2016-05-10: 100 mg via ORAL
  Filled 2016-05-10: qty 2

## 2016-05-10 MED ORDER — ACETAMINOPHEN 325 MG PO TABS: 650.0000 mg | ORAL_TABLET | ORAL | Status: DC | PRN

## 2016-05-10 MED ORDER — FLUOXETINE HCL 10 MG PO CAPS
10.0000 mg | ORAL_CAPSULE | Freq: Every day | ORAL | Status: DC
  Administered 2016-05-11 – 2016-05-12 (×2): 10 mg via ORAL
  Filled 2016-05-10 (×3): qty 1

## 2016-05-10 MED ORDER — NITROGLYCERIN 0.4 MG SL SUBL: 0.4000 mg | SUBLINGUAL_TABLET | SUBLINGUAL | Status: DC | PRN

## 2016-05-10 MED ORDER — ZINC SULFATE 220 (50 ZN) MG PO CAPS
220.0000 mg | ORAL_CAPSULE | Freq: Every day | ORAL | Status: DC
  Administered 2016-05-11 – 2016-05-12 (×2): 220 mg via ORAL
  Filled 2016-05-10 (×3): qty 1

## 2016-05-10 MED ORDER — CLONIDINE HCL 0.3 MG PO TABS
0.3000 mg | ORAL_TABLET | Freq: Three times a day (TID) | ORAL | Status: DC
  Administered 2016-05-10 – 2016-05-12 (×5): 0.3 mg via ORAL
  Filled 2016-05-10 (×5): qty 1

## 2016-05-10 MED ORDER — GLUCERNA PO LIQD
237.0000 mL | Freq: Four times a day (QID) | ORAL | Status: DC
  Administered 2016-05-10 – 2016-05-11 (×4): 237 mL via ORAL
  Filled 2016-05-10 (×8): qty 237

## 2016-05-10 MED ORDER — POLYETHYLENE GLYCOL 3350 17 G PO PACK
17.0000 g | PACK | Freq: Every day | ORAL | Status: DC
  Administered 2016-05-11 – 2016-05-12 (×2): 17 g via ORAL
  Filled 2016-05-10 (×2): qty 1

## 2016-05-10 MED ORDER — ONDANSETRON HCL 4 MG/2ML IJ SOLN: 4.0000 mg | Freq: Four times a day (QID) | INTRAMUSCULAR | Status: DC | PRN

## 2016-05-10 MED ORDER — INSULIN GLARGINE 100 UNIT/ML ~~LOC~~ SOLN
60.0000 [IU] | Freq: Every day | SUBCUTANEOUS | Status: DC
  Administered 2016-05-10 – 2016-05-11 (×2): 60 [IU] via SUBCUTANEOUS
  Filled 2016-05-10 (×2): qty 0.6

## 2016-05-10 MED ORDER — BETHANECHOL CHLORIDE 10 MG PO TABS
10.0000 mg | ORAL_TABLET | Freq: Three times a day (TID) | ORAL | Status: DC
  Administered 2016-05-10 – 2016-05-12 (×5): 10 mg via ORAL
  Filled 2016-05-10 (×7): qty 1

## 2016-05-10 MED ORDER — ASPIRIN 81 MG PO CHEW
324.0000 mg | CHEWABLE_TABLET | Freq: Once | ORAL | Status: AC
  Administered 2016-05-10: 324 mg via ORAL
  Filled 2016-05-10: qty 4

## 2016-05-10 MED ORDER — ASPIRIN 325 MG PO TABS
325.0000 mg | ORAL_TABLET | Freq: Every day | ORAL | Status: DC
  Administered 2016-05-11 – 2016-05-12 (×2): 325 mg via ORAL
  Filled 2016-05-10 (×2): qty 1

## 2016-05-10 MED ORDER — RIVAROXABAN 20 MG PO TABS
20.0000 mg | ORAL_TABLET | Freq: Every day | ORAL | Status: DC
  Administered 2016-05-10 – 2016-05-11 (×2): 20 mg via ORAL
  Filled 2016-05-10 (×3): qty 1

## 2016-05-10 MED ORDER — CARVEDILOL 25 MG PO TABS
25.0000 mg | ORAL_TABLET | Freq: Two times a day (BID) | ORAL | Status: DC
  Administered 2016-05-10 – 2016-05-12 (×4): 25 mg via ORAL
  Filled 2016-05-10 (×4): qty 1

## 2016-05-10 MED ORDER — ALBUTEROL SULFATE (2.5 MG/3ML) 0.083% IN NEBU: 2.5000 mg | INHALATION_SOLUTION | RESPIRATORY_TRACT | Status: DC | PRN

## 2016-05-10 MED ORDER — ONDANSETRON HCL 4 MG/2ML IJ SOLN
4.0000 mg | Freq: Once | INTRAMUSCULAR | Status: AC
Start: 2016-05-10 — End: 2016-05-10
  Administered 2016-05-10: 4 mg via INTRAVENOUS
  Filled 2016-05-10: qty 2

## 2016-05-10 MED ORDER — AMLODIPINE BESYLATE 10 MG PO TABS
10.0000 mg | ORAL_TABLET | Freq: Every day | ORAL | Status: DC
  Administered 2016-05-11 – 2016-05-12 (×2): 10 mg via ORAL
  Filled 2016-05-10 (×2): qty 1

## 2016-05-10 MED ORDER — MIRTAZAPINE 7.5 MG PO TABS
7.5000 mg | ORAL_TABLET | Freq: Every day | ORAL | Status: DC
  Administered 2016-05-10 – 2016-05-11 (×2): 7.5 mg via ORAL
  Filled 2016-05-10 (×2): qty 1

## 2016-05-10 MED ORDER — BISACODYL 10 MG RE SUPP: 10.0000 mg | RECTAL | Status: DC | PRN

## 2016-05-10 MED ORDER — SODIUM CHLORIDE 0.9 % IV BOLUS (SEPSIS): 1000.0000 mL | Freq: Once | INTRAVENOUS | Status: DC

## 2016-05-10 MED ORDER — ISOSORB DINITRATE-HYDRALAZINE 20-37.5 MG PO TABS
2.0000 | ORAL_TABLET | Freq: Three times a day (TID) | ORAL | Status: DC
  Administered 2016-05-10 – 2016-05-12 (×5): 2 via ORAL
  Filled 2016-05-10 (×6): qty 2

## 2016-05-10 MED ORDER — ATORVASTATIN CALCIUM 40 MG PO TABS
40.0000 mg | ORAL_TABLET | Freq: Every day | ORAL | Status: DC
  Administered 2016-05-10 – 2016-05-11 (×2): 40 mg via ORAL
  Filled 2016-05-10 (×2): qty 1

## 2016-05-10 NOTE — ED Notes (Signed)
Pt returned to room and placed back on monitor.  

## 2016-05-10 NOTE — ED Notes (Signed)
Placed pt on 2L [place] for O2 of 76-81% on RA. NP made aware.

## 2016-05-10 NOTE — ED Provider Notes (Signed)
[facility] DEPT Provider Note   CSN: [id number] Arrival date & time: 05/10/16  0174     History   Chief Complaint Chief Complaint  Patient presents with  . Emesis    HPI [name] is a 61 y.o. male.  Pt is a 61 y/o M with PMHx of CVA, CAD with ICM, diastolic CHF, B4WH, DVT- on Xarelto, CKD 3, htn, who presents to ED from SNF for nausea and vomiting x yesterday. Denies abdominal pain. Denies fevers, chills, dizziness, CP, SOB, cough, abd pain, diarrhea, dysuria, extremity pain/swelling, or any additional concerns.  Last echo 03/29/16- LVEF 25-30%      Past Medical History:  Diagnosis Date  . Adrenal insufficiency ([place])   . Allergy   . Arthritis   . Blind left eye   . Carpal tunnel syndrome   . Cataract    bil removed  . CHF (congestive heart failure) ([place])   . Diabetes mellitus without complication ([place])   . ED (erectile dysfunction)   . Gout   . Herpes   . Hypercholesterolemia   . Hypertension   . MI (myocardial infarction)   . Nausea & vomiting   . Peripheral neuropathy ([place])   . Pneumonia   . PVD (peripheral vascular disease) [facility])     Patient Active Problem List   Diagnosis Date Noted  . Nausea and vomiting   . Hypoxia 05/10/2016  . Elevated troponin 05/10/2016  . AKI (acute kidney injury) ([place]) 05/10/2016  . Pressure injury of skin 05/10/2016  . CKD (chronic kidney disease) stage 3, GFR 30-59 ml/min 04/28/2016  . Hypernatremia 04/28/2016  . Dysphagia due to recent stroke 04/28/2016  . Cystitis due to Pseudomonas 04/28/2016  . Enterococcus UTI 04/28/2016  . Hyperlipidemia associated with type 2 diabetes mellitus ([place]) 04/28/2016  . Depression 04/28/2016  . Urinary retention   . Accidental fall from chair   . FUO (fever of unknown origin)   . Cognitive and behavioral changes   . Cerebrovascular accident (CVA) due to embolism of left posterior cerebral artery ([place])   . Lethargy   . Acute on chronic renal insufficiency   . Low back pain   .  Leukocytosis   . Labile blood glucose   . Hypoglycemia due to insulin   . UTI due to Klebsiella species   . Deep tissue injury   . Renovascular hypertension   . History of DVT (deep vein thrombosis)   . Coronary artery disease involving native coronary artery of native heart without angina pectoris   . Morbid obesity ([place])   . Stage 2 chronic kidney disease   . Hypokalemia   . Acute blood loss anemia   . Embolic stroke involving posterior cerebral artery ([place]) 04/03/2016  . Brainstem stroke ([place]) 04/01/2016  . Abnormality of gait and mobility   . Cerebral infarction due to thrombosis of other cerebral artery ([place])   . Right arm numbness   . Left sided numbness 03/29/2016  . TIA (transient ischemic attack) 03/29/2016  . Hypertensive urgency 03/29/2016  . Sepsis ([place]) 09/10/2013  . DVT (deep venous thrombosis) ([place]) 01/01/2013  . Rash and nonspecific skin eruption 12/29/2012  . Cellulitis and abscess of leg 12/29/2012  . Diabetes type 2, uncontrolled ([place]) 12/29/2012  . Hypertension   . Cardiomyopathy ([place])   . CHF (congestive heart failure) ([place])   . Peripheral neuropathy ([place])   . Special screening for malignant neoplasms, colon 12/09/2012    Past Surgical History:  Procedure Laterality Date  .  CHOLECYSTECTOMY    . CORONARY ANGIOPLASTY WITH STENT PLACEMENT  2007   two 2.5  x 13 mm Cypher stents to the RCA  . dilatera cataracts removed    . right knee surgary         Home Medications    Prior to Admission medications   Medication Sig Start Date End Date Taking? Authorizing Provider  acetaminophen (TYLENOL) 160 MG/5ML suspension Take 650 mg by mouth every 4 (four) hours as needed for mild pain. 20.3 ml ( 650 mg total)   Yes Historical Provider, MD  amLODipine (NORVASC) 10 MG tablet Take 1 tablet (10 mg total) by mouth daily. 04/25/16  Yes Ivan Anchors Abdo, PA-C  atorvastatin (LIPITOR) 40 MG tablet Take 1 tablet (40 mg total) by mouth daily at 6 PM. 04/03/16  Yes Donne Hazel, MD  bethanechol (URECHOLINE) 10 MG tablet Take 1 tablet (10 mg total) by mouth 3 (three) times daily. 04/25/16  Yes Ivan Anchors Osmon, PA-C  bisacodyl (DULCOLAX) 10 MG suppository Place 10 mg rectally as needed for moderate constipation.   Yes Historical Provider, MD  carvedilol (COREG) 25 MG tablet Take 1 tablet (25 mg total) by mouth 2 (two) times daily with a meal. 04/03/16  Yes Donne Hazel, MD  cloNIDine (CATAPRES) 0.3 MG tablet Take 1 tablet (0.3 mg total) by mouth 3 (three) times daily. 04/03/16  Yes Donne Hazel, MD  FLUoxetine (PROZAC) 10 MG capsule Take 1 capsule (10 mg total) by mouth daily. 04/25/16  Yes Ivan Anchors [place], PA-C  GLUCERNA (GLUCERNA) LIQD Take 237 mLs by mouth 4 (four) times daily.   Yes Historical Provider, MD  hydrocerin (EUCERIN) CREA Apply 1 application topically 2 (two) times daily. 04/25/16  Yes Ivan Anchors Cicio, PA-C  hydrocortisone (ANUSOL-HC) 2.5 % rectal cream Place rectally 2 (two) times daily as needed for hemorrhoids or itching. 04/25/16  Yes Ivan Anchors Zamorano, PA-C  insulin aspart (NOVOLOG) 100 UNIT/ML injection Inject 8 Units into the skin 3 (three) times daily with meals. Patient taking differently: Inject 12 Units into the skin 3 (three) times daily with meals.  04/25/16  Yes Ivan Anchors Huang, PA-C  Insulin Glargine (TOUJEO SOLOSTAR) 300 UNIT/ML SOPN Inject 60 Units into the skin at bedtime.   Yes Historical Provider, MD  isosorbide-hydrALAZINE (BIDIL) 20-37.5 MG tablet Take 2 tablets by mouth 3 (three) times daily. 04/03/16  Yes Donne Hazel, MD  lidocaine (XYLOCAINE) 2 % jelly Apply topically as needed (Use with in and out catheter). 04/25/16  Yes Ivan Anchors Morten, PA-C  Magnesium Hydroxide (MILK OF MAGNESIA PO) Take 30 mLs by mouth as needed (for constipation).   Yes Historical Provider, MD  mirtazapine (REMERON) 7.5 MG tablet Take 1 tablet (7.5 mg total) by mouth at bedtime. 04/25/16  Yes Ivan Anchors Mccart, PA-C  Multiple Vitamins-Minerals (MULTIVITAMIN PO) Take 1 tablet by  mouth daily.   Yes Historical Provider, MD  ondansetron (ZOFRAN) 4 MG tablet Take 4 mg by mouth every 6 (six) hours as needed for nausea or vomiting.   Yes Historical Provider, MD  polyethylene glycol (MIRALAX / GLYCOLAX) packet Take 17 g by mouth daily. 04/25/16  Yes Ivan Anchors Macdonell, PA-C  rivaroxaban (XARELTO) 20 MG TABS tablet Take 1 tablet (20 mg total) by mouth daily with supper. 04/25/16  Yes Ivan Anchors Saulters, PA-C  senna-docusate (SENOKOT-S) 8.6-50 MG tablet Take 1 tablet by mouth daily. 04/04/16  Yes Donne Hazel, MD  Sodium Phosphates (RA SALINE ENEMA) 19-7  GM/118ML ENEM Place 1 each rectally as needed (for constipation).   Yes Historical Provider, MD  tamsulosin (FLOMAX) 0.4 MG CAPS capsule Take 1 capsule (0.4 mg total) by mouth daily after supper. 04/25/16  Yes Ivan Anchors [place]enbaas, PA-C  zinc sulfate 220 (50 Zn) MG capsule Take 220 mg by mouth daily.   Yes Historical Provider, MD  albuterol (PROVENTIL) (2.5 MG/3ML) 0.083% nebulizer solution Take 3 mLs (2.5 mg total) by nebulization every 2 (two) hours as needed for shortness of breath. 05/12/16   Ripudeep Krystal Eaton, MD  nitroGLYCERIN (NITROSTAT) 0.4 MG SL tablet Place 1 tablet (0.4 mg total) under the tongue every 5 (five) minutes as needed for chest pain. 05/12/16   Ripudeep Krystal Eaton, MD    Family History Family History  Problem Relation Age of Onset  . Breast cancer Mother   . Hypertension Father     blood clot in leg  . Diabetes Maternal Aunt   . Heart attack Sister   . Colon cancer Neg Hx   . Esophageal cancer Neg Hx   . Rectal cancer Neg Hx   . Stomach cancer Neg Hx     Social History Social History  Substance Use Topics  . Smoking status: Never Smoker  . Smokeless tobacco: Never Used  . Alcohol use No     Allergies   Iohexol and Ivp dye [iodinated diagnostic agents]   Review of Systems Review of Systems  Constitutional: Negative for chills, fever and unexpected weight change.  HENT: Negative for sore throat and trouble swallowing.    Respiratory: Negative for cough and shortness of breath.   Cardiovascular: Negative for chest pain and palpitations.  Gastrointestinal: Positive for nausea and vomiting. Negative for abdominal pain and diarrhea.  Genitourinary: Negative for dysuria.  Musculoskeletal: Negative for back pain, neck pain and neck stiffness.  Skin: Negative for rash.  Neurological: Negative for dizziness, weakness and numbness.     Physical Exam Updated Vital Signs BP 133/68 (BP Location: Right Arm)   Pulse 72   Temp 97.7 F (36.5 C) (Oral)   Resp 18   Ht 5\' 9"  (1.753 m)   Wt 98.9 kg   SpO2 97%   BMI 32.21 kg/m   Physical Exam  Constitutional: He is oriented to person, place, and time. He appears well-developed.  HENT:  Head: Normocephalic and atraumatic.  Right Ear: Tympanic membrane and ear canal normal.  Left Ear: Tympanic membrane and ear canal normal.  Nose: Nose normal.  Mouth/Throat: Uvula is midline and oropharynx is clear and moist. Mucous membranes are dry.  Eyes: Conjunctivae and EOM are normal. Pupils are equal, round, and reactive to light.  Neck: Normal range of motion. Neck supple.  Cardiovascular: Normal rate, regular rhythm, normal heart sounds and intact distal pulses.  Exam reveals no gallop and no friction rub.   No murmur heard. Pulmonary/Chest: Effort normal and breath sounds normal. He has no wheezes. He has no rales.  Abdominal: Soft. Bowel sounds are normal. There is no tenderness. There is no rebound and no guarding.  Musculoskeletal: Normal range of motion.  Neurological: He is alert and oriented to person, place, and time. He has normal strength.   +dysarthria, baseline per records (04/03/16)  Nursing note and vitals reviewed.    ED Treatments / Results  Labs (all labs ordered are listed, but only abnormal results are displayed) Labs Reviewed  COMPREHENSIVE METABOLIC PANEL - Abnormal; Notable for the following:       Result Value  Glucose, Bld 148 (*)    BUN  65 (*)    Creatinine, Ser 2.05 (*)    Calcium 8.6 (*)    Albumin 2.7 (*)    AST 131 (*)    ALT 138 (*)    GFR calc non Af Amer 34 (*)    GFR calc Af Amer 39 (*)    All other components within normal limits  TROPONIN I - Abnormal; Notable for the following:    Troponin I 0.09 (*)    All other components within normal limits  CBC WITH DIFFERENTIAL/PLATELET - Abnormal; Notable for the following:    Hemoglobin 12.6 (*)    Monocytes Absolute 1.1 (*)    All other components within normal limits  PROTIME-INR - Abnormal; Notable for the following:    Prothrombin Time 15.4 (*)    All other components within normal limits  TROPONIN I - Abnormal; Notable for the following:    Troponin I 0.08 (*)    All other components within normal limits  TROPONIN I - Abnormal; Notable for the following:    Troponin I 0.10 (*)    All other components within normal limits  TROPONIN I - Abnormal; Notable for the following:    Troponin I 0.08 (*)    All other components within normal limits  GLUCOSE, CAPILLARY - Abnormal; Notable for the following:    Glucose-Capillary 239 (*)    All other components within normal limits  CBC - Abnormal; Notable for the following:    WBC 13.5 (*)    RBC 4.19 (*)    Hemoglobin 12.1 (*)    HCT 38.4 (*)    All other components within normal limits  BASIC METABOLIC PANEL - Abnormal; Notable for the following:    Glucose, Bld 240 (*)    BUN 57 (*)    Creatinine, Ser 1.98 (*)    Calcium 8.4 (*)    GFR calc non Af Amer 35 (*)    GFR calc Af Amer 41 (*)    All other components within normal limits  GLUCOSE, CAPILLARY - Abnormal; Notable for the following:    Glucose-Capillary 142 (*)    All other components within normal limits  CBG MONITORING, ED - Abnormal; Notable for the following:    Glucose-Capillary 134 (*)    All other components within normal limits  LIPASE, BLOOD  URINALYSIS, ROUTINE W REFLEX MICROSCOPIC  BRAIN NATRIURETIC PEPTIDE  HIV ANTIBODY (ROUTINE  TESTING)    EKG  EKG Interpretation  Date/Time:  Thursday May 10 2016 10:22:09 EDT Ventricular Rate:  66 PR Interval:    QRS Duration: 106 QT Interval:  458 QTC Calculation: 480 R Axis:   0 Text Interpretation:  Sinus rhythm Abnormal T, consider ischemia, lateral leads Borderline ST elevation, anterior leads Baseline wander in lead(s) V3 V6 No significant change since May 2016 Confirmed by Rogene Houston  MD, [place] [phone]) on 05/10/2016 10:29:59 AM       Radiology No results found.  Procedures Procedures (including critical care time)  Medications Ordered in ED Medications  ondansetron (ZOFRAN) injection 4 mg (4 mg Intravenous Given 05/10/16 1032)  sodium chloride 0.9 % bolus 500 mL (500 mLs Intravenous New Bag/Given 05/10/16 1014)  aspirin chewable tablet 324 mg (324 mg Oral Given 05/10/16 1838)  RESOURCE THICKENUP CLEAR ( Oral Given 05/11/16 1100)     Initial Impression / Assessment and Plan / ED Course  I have reviewed the triage vital signs and the nursing notes.  Pertinent  labs & imaging results that were available during my care of the patient were reviewed by me and considered in my medical decision making (see chart for details).    Pt is a 61 y/o M who presents to ED for SNF for nausea and vomiting, hx of cholecystectomy. Abdomen soft, nontender. Will get labs for electrolyte abnormality, UA for infection, and CT abd pelvis for acute abdominal process.    11:39 AM Trop 0.09, likely s/t renal function.   EKG: no STEMI.   12:15 PM Repeat trop in 3 hours, pt denies CP at this time.   1:16 PM Notified by nurse that O2 dropped to 87% RA, placed on 2L [place]. On re-eval, pt breathing with ease on O2 will continue to monitor.   2:40 pm Repeat trop 0.08. Given hypoxia and repeat trop, plan to admit for further monitoring.   3:19 PM Discussed with hospitalists, will admit.   The patient was discussed with and seen by Dr. Rogene Houston who agrees with the treatment  plan.  Final Clinical Impressions(s) / ED Diagnoses   Final diagnoses:  Nausea and vomiting, intractability of vomiting not specified, unspecified vomiting type  Elevated troponin    New Prescriptions Discharge Medication List as of 05/12/2016  1:32 PM    START taking these medications   Details  albuterol (PROVENTIL) (2.5 MG/3ML) 0.083% nebulizer solution Take 3 mLs (2.5 mg total) by nebulization every 2 (two) hours as needed for shortness of breath., Starting Sat 05/12/2016, No Print    nitroGLYCERIN (NITROSTAT) 0.4 MG SL tablet Place 1 tablet (0.4 mg total) under the tongue every 5 (five) minutes as needed for chest pain., Starting Sat 05/12/2016, No Print         Bernadene Bell [place], NP 05/13/16 2158    Fredia Sorrow, MD 06/06/16 1553

## 2016-05-10 NOTE — ED Notes (Signed)
Patient transported to X-ray 

## 2016-05-10 NOTE — H&P (Signed)
Patient unable to finish medical history ,did know about his vaccinations . RN caring for patient attempting to call Nursing Home to finish Medical History.

## 2016-05-10 NOTE — ED Triage Notes (Signed)
Pt to ER by [facility] from [facility] [place] facility after vomiting x2 in the last 24 hours. Denies nausea at this time. Pt denies abdominal pain, abdomen is non-tender on exam. Pt grimaces and groans as if in pain but denies pain when asked. Is alert to self and place. Facility reports patient has not had BP medication today due to vomiting, BP 186/88 per EMS.

## 2016-05-10 NOTE — ED Notes (Signed)
Pt removing O2 and removing monitors.

## 2016-05-10 NOTE — H&P (Signed)
[facility] History and Physical  [name] OXB:[id number] DOB: 1955/09/27 DOA: 05/10/2016  Referring physician:  PCP: [name], MD   Chief Complaint: "He vomited and it shot across the room."-Nurse  HPI: [name] is a 61 y.o. male  with past medical history of adrenal insufficiency, allergies, blind left eye, CHF, diabetes, herpes, hypertension, heart attack, peripheral neuropathy presents to the emergency room with chief complaint of vomiting. History gathered from patient who appears mildly sedated and nursing staff from his nursing home. Patient has been in his normal state of health for the last few days. No acute issues or symptoms. Patient awoke this morning and nursing came to his aid after he had multiple episodes of what is described as projectile vomiting with some green contents. Nursing staff they are notified the physician on call who advised transfer to the emergency room for evaluation.  ED course: Labs were checked. Patient had desaturation and the ED was placed on oxygen. Serial troponin went from 0.09-->0.08. A bolus of 500 mL of fluid was gave consent to the patient hospitalists were consulted for admission.  Review of Systems:  As per HPI otherwise 10 point review of systems negative.    Past Medical History:  Diagnosis Date  . Adrenal insufficiency ([place])   . Allergy   . Arthritis   . Blind left eye   . Carpal tunnel syndrome   . Cataract    bil removed  . CHF (congestive heart failure) ([place])   . Diabetes mellitus without complication ([place])   . ED (erectile dysfunction)   . Gout   . Herpes   . Hypercholesterolemia   . Hypertension   . MI (myocardial infarction)   . Peripheral neuropathy ([place])   . Pneumonia   . PVD (peripheral vascular disease) ([place])    Past Surgical History:  Procedure Laterality Date  . CHOLECYSTECTOMY    . CORONARY ANGIOPLASTY WITH STENT PLACEMENT  2007   two 2.5  x 13 mm Cypher stents to the RCA  .  dilatera cataracts removed    . right knee surgary     Social History:  reports that he has never smoked. He has never used smokeless tobacco. He reports that he does not drink alcohol or use drugs.  Allergies  Allergen Reactions  . Iohexol Hives  . Ivp Dye [Iodinated Diagnostic Agents] Hives    Family History  Problem Relation Age of Onset  . Breast cancer Mother   . Hypertension Father     blood clot in leg  . Diabetes Maternal Aunt   . Heart attack Sister   . Colon cancer Neg Hx   . Esophageal cancer Neg Hx   . Rectal cancer Neg Hx   . Stomach cancer Neg Hx      Prior to Admission medications   Medication Sig Start Date End Date Taking? Authorizing Provider  acetaminophen (TYLENOL) 160 MG/5ML suspension Take 650 mg by mouth every 4 (four) hours as needed for mild pain. 20.3 ml ( 650 mg total)   Yes Historical Provider, MD  amLODipine (NORVASC) 10 MG tablet Take 1 tablet (10 mg total) by mouth daily. 04/25/16  Yes Ivan Anchors Mcphillips, PA-C  atorvastatin (LIPITOR) 40 MG tablet Take 1 tablet (40 mg total) by mouth daily at 6 PM. 04/03/16  Yes Donne Hazel, MD  bethanechol (URECHOLINE) 10 MG tablet Take 1 tablet (10 mg total) by mouth 3 (three) times daily. 04/25/16  Yes Bary Leriche, PA-C  bisacodyl (DULCOLAX) 10 MG suppository Place 10 mg rectally as needed for moderate constipation.   Yes Historical Provider, MD  carvedilol (COREG) 25 MG tablet Take 1 tablet (25 mg total) by mouth 2 (two) times daily with a meal. 04/03/16  Yes Donne Hazel, MD  chlorthalidone (HYGROTON) 25 MG tablet Take 3 tablets (75 mg total) by mouth daily. 04/25/16  Yes Ivan Anchors Haig, PA-C  clindamycin (CLEOCIN) 300 MG capsule Take 300 mg by mouth every 6 (six) hours. 05/09/16 05/16/16 Yes Historical Provider, MD  cloNIDine (CATAPRES) 0.3 MG tablet Take 1 tablet (0.3 mg total) by mouth 3 (three) times daily. 04/03/16  Yes Donne Hazel, MD  FLUoxetine (PROZAC) 10 MG capsule Take 1 capsule (10 mg total) by mouth daily.  04/25/16  Yes Ivan Anchors Benison, PA-C  furosemide (LASIX) 80 MG tablet Take 80 mg by mouth daily.    Yes Historical Provider, MD  GLUCERNA (GLUCERNA) LIQD Take 237 mLs by mouth 4 (four) times daily.   Yes Historical Provider, MD  hydrocerin (EUCERIN) CREA Apply 1 application topically 2 (two) times daily. 04/25/16  Yes Ivan Anchors Yeldell, PA-C  hydrocortisone (ANUSOL-HC) 2.5 % rectal cream Place rectally 2 (two) times daily as needed for hemorrhoids or itching. 04/25/16  Yes Ivan Anchors Verbeke, PA-C  insulin aspart (NOVOLOG) 100 UNIT/ML injection Inject 8 Units into the skin 3 (three) times daily with meals. Patient taking differently: Inject 12 Units into the skin 3 (three) times daily with meals.  04/25/16  Yes Ivan Anchors Swiney, PA-C  Insulin Glargine (TOUJEO SOLOSTAR) 300 UNIT/ML SOPN Inject 60 Units into the skin at bedtime.   Yes Historical Provider, MD  isosorbide-hydrALAZINE (BIDIL) 20-37.5 MG tablet Take 2 tablets by mouth 3 (three) times daily. 04/03/16  Yes Donne Hazel, MD  lidocaine (XYLOCAINE) 2 % jelly Apply topically as needed (Use with in and out catheter). 04/25/16  Yes Ivan Anchors Christiansen, PA-C  losartan (COZAAR) 100 MG tablet Take 1 tablet (100 mg total) by mouth daily. 04/04/16  Yes Donne Hazel, MD  Magnesium Hydroxide (MILK OF MAGNESIA PO) Take 30 mLs by mouth as needed (for constipation).   Yes Historical Provider, MD  mirtazapine (REMERON) 7.5 MG tablet Take 1 tablet (7.5 mg total) by mouth at bedtime. 04/25/16  Yes Ivan Anchors Hoar, PA-C  Multiple Vitamins-Minerals (MULTIVITAMIN PO) Take 1 tablet by mouth daily.   Yes Historical Provider, MD  ondansetron (ZOFRAN) 4 MG tablet Take 4 mg by mouth every 6 (six) hours as needed for nausea or vomiting.   Yes Historical Provider, MD  polyethylene glycol (MIRALAX / GLYCOLAX) packet Take 17 g by mouth daily. 04/25/16  Yes Ivan Anchors Wilkinson, PA-C  potassium chloride 20 MEQ/15ML (10%) SOLN Take 30 mLs (40 mEq total) by mouth 2 (two) times daily. 04/25/16  Yes Ivan Anchors Kunin,  PA-C  rivaroxaban (XARELTO) 20 MG TABS tablet Take 1 tablet (20 mg total) by mouth daily with supper. 04/25/16  Yes Ivan Anchors Persad, PA-C  senna-docusate (SENOKOT-S) 8.6-50 MG tablet Take 1 tablet by mouth daily. 04/04/16  Yes Donne Hazel, MD  Sodium Phosphates (RA SALINE ENEMA) 19-7 GM/118ML ENEM Place 1 each rectally as needed (for constipation).   Yes Historical Provider, MD  tamsulosin (FLOMAX) 0.4 MG CAPS capsule Take 1 capsule (0.4 mg total) by mouth daily after supper. 04/25/16  Yes Ivan Anchors Brumm, PA-C  zinc sulfate 220 (50 Zn) MG capsule Take 220 mg by mouth daily.   Yes Historical Provider,  MD  insulin aspart protamine- aspart (NOVOLOG MIX 70/30) (70-30) 100 UNIT/ML injection Inject 0.3 mLs (30 Units total) into the skin daily with supper. Patient not taking: Reported on 05/10/2016 04/25/16   Ivan Anchors Muto, PA-C  insulin aspart protamine- aspart (NOVOLOG MIX 70/30) (70-30) 100 UNIT/ML injection Inject 0.45 mLs (45 Units total) into the skin daily with breakfast. Patient not taking: Reported on 05/10/2016 04/25/16   Bary Leriche, PA-C   Physical Exam: Vitals:   05/10/16 1330 05/10/16 1413 05/10/16 1414 05/10/16 1415  BP: (!) 149/94   (!) 122/97  Pulse:  66 69 68  Resp: 17 16 13 12   Temp:      TempSrc:      SpO2:  (!) 76% (!) 81% 95%  Weight:      Height:        Wt Readings from Last 3 Encounters:  05/10/16 104.3 kg (230 lb)  05/04/16 104.3 kg (230 lb)  04/27/16 110.2 kg (242 lb 15.2 oz)    General:  Appears calm and comfortable, GCS 14, sleepy Eyes:  PERRL, EOMI, normal lids, iris ENT:  grossly normal hearing, lips & tongue Neck:  no LAD, masses or thyromegaly Cardiovascular:  RRR, no m/r/g. No LE edema.  Respiratory:  CTA bilaterally, no w/r/r. Normal respiratory effort. Abdomen:  soft, ntnd Skin:  no rash or induration seen on limited exam; sacral skin breakdown, skin breakdown on the heels Musculoskeletal:  grossly normal tone BUE/BLE Psychiatric:  grossly normal mood and  affect, speech fluent and appropriate Neurologic:  CN 2-12 grossly intact, moves all extremities in coordinated fashion.          Labs on Admission:  Basic Metabolic Panel:  Recent Labs Lab 05/10/16 1027  NA 141  K 3.8  CL 104  CO2 27  GLUCOSE 148*  BUN 65*  CREATININE 2.05*  CALCIUM 8.6*   Liver Function Tests:  Recent Labs Lab 05/10/16 1027  AST 131*  ALT 138*  ALKPHOS 70  BILITOT 0.5  PROT 8.1  ALBUMIN 2.7*    Recent Labs Lab 05/10/16 1027  LIPASE 27   No results for input(s): AMMONIA in the last 168 hours. CBC:  Recent Labs Lab 05/10/16 1027  WBC 10.5  NEUTROABS 7.4  HGB 12.6*  HCT 39.1  MCV 89.9  PLT 253   Cardiac Enzymes:  Recent Labs Lab 05/10/16 1027 05/10/16 1333  TROPONINI 0.09* 0.08*    BNP (last 3 results) No results for input(s): BNP in the last 8760 hours.  ProBNP (last 3 results) No results for input(s): PROBNP in the last 8760 hours.   Serum creatinine: 2.05 mg/dL (H) 05/10/16 1027 Estimated creatinine clearance: 45.6 mL/min (A)  CBG:  Recent Labs Lab 05/10/16 1015  GLUCAP 134*    Radiological Exams on Admission: Dg Chest 2 View  Result Date: 05/10/2016 CLINICAL DATA:  Weakness. EXAM: CHEST  2 VIEW COMPARISON:  03/29/2016. FINDINGS: Cardiomegaly with normal pulmonary vascularity. Mild interstitial prominence. Mild CHF cannot be excluded P Low lung volumes. No pleural effusion or pneumothorax. IMPRESSION: Cardiomegaly with mild interstitial prominence. Mild CHF cannot be excluded. Low lung volumes. Electronically Signed   By: Marcello Moores  Register   On: 05/10/2016 12:16   Ct Chest Wo Contrast  Result Date: 05/10/2016 CLINICAL DATA:  Vomiting.  Mental status changes. EXAM: CT CHEST WITHOUT CONTRAST TECHNIQUE: Multidetector CT imaging of the chest was performed following the standard protocol without IV contrast. COMPARISON:  Chest radiography same day and multiple previous. FINDINGS: Cardiovascular: Mild  cardiomegaly. No  pericardial effusion. Aortic atherosclerosis without aneurysm. Extensive coronary artery calcification. Mediastinum/Nodes: No mediastinal mass or lymphadenopathy. Lungs/Pleura: No pleural fluid. Poor inspiration. Allowing for that, the lungs are clear. No evidence of edema, consolidation or collapse. No evidence of aspiration. Upper Abdomen: Previous cholecystectomy.  No acute finding. Musculoskeletal: Chronic degenerative changes of the thoracic spine. IMPRESSION: No acute finding by CT. Cardiomegaly. Coronary artery calcification. Aortic atherosclerosis. No evidence of pneumonia or aspiration. Electronically Signed   By: Nelson Chimes M.D.   On: 05/10/2016 16:17   Dg Abd 2 Views  Result Date: 05/10/2016 CLINICAL DATA:  Nausea and vomiting EXAM: ABDOMEN - 2 VIEW COMPARISON:  None. FINDINGS: Scattered large and small bowel gas is noted. No acute mass is noted. Some scattered densities are noted over both kidneys likely related to vascular calcifications. Mild degenerative changes of the lumbar spine are seen. No free air is noted. IMPRESSION: No acute abnormality noted. Electronically Signed   By: Inez Catalina M.D.   On: 05/10/2016 16:12    EKG: Independently reviewed. Ventricular rate 66, QRS 106, QT 58; compared to February 2018 EKG report abnormalities in lead 1 aVL V5 V6 to present borderline ST abnormalities in V2 V3 will get serial EKG  Assessment/Plan Principal Problem:   Hypoxia Active Problems:   Diabetes type 2, uncontrolled (HCC)   Hypertension   Elevated troponin   AKI (acute kidney injury) ([place])   Elevated troponin/hypoxia DDx aspiration, sedation, cardiac>>>PE - serial trop ordered, initial neg - prn EKG CP - prn moprhine CP - prn ntg cp - asa in ED and QD - ECHO ordered for AM - tele bed, cardiac monitoring - zofran prn for nausea Chest x-ray with possible re-of fluid overload and get CT chest since patient is a poor historian, no contrast due to renal function Mild  troponin anemia likely due to hypoxia EKG with mild abnormalities in V2 V3 able to repeat EKG Check BNP Small troponin bump likely exacerbated by renal function No c/o CP or SOB  Hx of VTE Well's score PE 3.0, low risk; <4 is unlikely Low O2 sat resolved with no intervention Pt already on xarelto Cont xarelto  Skin breakdown Wound care consult  AKI/CKD Monitor Cr daily Cr at baseline,  1.68 2.05 on admit, BUN creatinine ratio greater than 20 = prerenal Hold hygroton, lasix, liq potassium Gentle hydration  N/V Checking abd xr in pt with chronic constipation to eval stool burden  Depression Cont prozac, remeron  DM Glargine 60u qhs SSI  Chronic constipation Daily MiraLAX  Hypertension When necessary hydralazine 10 mg IV as needed for severe blood pressure Cont norvasc, clonidine, bidil, cozaar, coreg  Urinary retention Cont Urecholine, flomax  Hyperlipidemia Continue statin   Code Status: FULL DVT Prophylaxis: Xarelto  Family Communication: none available Disposition Plan: Pending Improvement  Status: tele obs  Elwin Mocha, MD Family Medicine [facility] [email or web address] Password [email or web address]

## 2016-05-11 ENCOUNTER — Observation Stay (HOSPITAL_BASED_OUTPATIENT_CLINIC_OR_DEPARTMENT_OTHER): Payer: Medicare Other

## 2016-05-11 DIAGNOSIS — K5909 Other constipation: Secondary | ICD-10-CM | POA: Diagnosis present

## 2016-05-11 DIAGNOSIS — Z79899 Other long term (current) drug therapy: Secondary | ICD-10-CM | POA: Diagnosis not present

## 2016-05-11 DIAGNOSIS — I129 Hypertensive chronic kidney disease with stage 1 through stage 4 chronic kidney disease, or unspecified chronic kidney disease: Secondary | ICD-10-CM | POA: Diagnosis not present

## 2016-05-11 DIAGNOSIS — I255 Ischemic cardiomyopathy: Secondary | ICD-10-CM | POA: Diagnosis not present

## 2016-05-11 DIAGNOSIS — L8992 Pressure ulcer of unspecified site, stage 2: Secondary | ICD-10-CM | POA: Diagnosis not present

## 2016-05-11 DIAGNOSIS — R748 Abnormal levels of other serum enzymes: Secondary | ICD-10-CM | POA: Diagnosis not present

## 2016-05-11 DIAGNOSIS — R488 Other symbolic dysfunctions: Secondary | ICD-10-CM | POA: Diagnosis not present

## 2016-05-11 DIAGNOSIS — I2510 Atherosclerotic heart disease of native coronary artery without angina pectoris: Secondary | ICD-10-CM | POA: Diagnosis not present

## 2016-05-11 DIAGNOSIS — I10 Essential (primary) hypertension: Secondary | ICD-10-CM | POA: Diagnosis not present

## 2016-05-11 DIAGNOSIS — I739 Peripheral vascular disease, unspecified: Secondary | ICD-10-CM | POA: Diagnosis not present

## 2016-05-11 DIAGNOSIS — I150 Renovascular hypertension: Secondary | ICD-10-CM

## 2016-05-11 DIAGNOSIS — Z794 Long term (current) use of insulin: Secondary | ICD-10-CM

## 2016-05-11 DIAGNOSIS — E2740 Unspecified adrenocortical insufficiency: Secondary | ICD-10-CM | POA: Diagnosis present

## 2016-05-11 DIAGNOSIS — L03116 Cellulitis of left lower limb: Secondary | ICD-10-CM | POA: Diagnosis not present

## 2016-05-11 DIAGNOSIS — R112 Nausea with vomiting, unspecified: Secondary | ICD-10-CM | POA: Diagnosis not present

## 2016-05-11 DIAGNOSIS — Z955 Presence of coronary angioplasty implant and graft: Secondary | ICD-10-CM | POA: Diagnosis not present

## 2016-05-11 DIAGNOSIS — I252 Old myocardial infarction: Secondary | ICD-10-CM | POA: Diagnosis not present

## 2016-05-11 DIAGNOSIS — I70244 Atherosclerosis of native arteries of left leg with ulceration of heel and midfoot: Secondary | ICD-10-CM | POA: Diagnosis not present

## 2016-05-11 DIAGNOSIS — N183 Chronic kidney disease, stage 3 (moderate): Secondary | ICD-10-CM | POA: Diagnosis present

## 2016-05-11 DIAGNOSIS — E1122 Type 2 diabetes mellitus with diabetic chronic kidney disease: Secondary | ICD-10-CM | POA: Diagnosis present

## 2016-05-11 DIAGNOSIS — D649 Anemia, unspecified: Secondary | ICD-10-CM | POA: Diagnosis present

## 2016-05-11 DIAGNOSIS — J9601 Acute respiratory failure with hypoxia: Secondary | ICD-10-CM | POA: Diagnosis present

## 2016-05-11 DIAGNOSIS — E785 Hyperlipidemia, unspecified: Secondary | ICD-10-CM | POA: Diagnosis not present

## 2016-05-11 DIAGNOSIS — Z86718 Personal history of other venous thrombosis and embolism: Secondary | ICD-10-CM | POA: Diagnosis not present

## 2016-05-11 DIAGNOSIS — M6281 Muscle weakness (generalized): Secondary | ICD-10-CM | POA: Diagnosis not present

## 2016-05-11 DIAGNOSIS — L89152 Pressure ulcer of sacral region, stage 2: Secondary | ICD-10-CM | POA: Diagnosis present

## 2016-05-11 DIAGNOSIS — R1312 Dysphagia, oropharyngeal phase: Secondary | ICD-10-CM | POA: Diagnosis not present

## 2016-05-11 DIAGNOSIS — Z7901 Long term (current) use of anticoagulants: Secondary | ICD-10-CM | POA: Diagnosis not present

## 2016-05-11 DIAGNOSIS — E1142 Type 2 diabetes mellitus with diabetic polyneuropathy: Secondary | ICD-10-CM | POA: Diagnosis present

## 2016-05-11 DIAGNOSIS — R079 Chest pain, unspecified: Secondary | ICD-10-CM | POA: Diagnosis not present

## 2016-05-11 DIAGNOSIS — L89312 Pressure ulcer of right buttock, stage 2: Secondary | ICD-10-CM | POA: Diagnosis present

## 2016-05-11 DIAGNOSIS — I5042 Chronic combined systolic (congestive) and diastolic (congestive) heart failure: Secondary | ICD-10-CM | POA: Diagnosis present

## 2016-05-11 DIAGNOSIS — E1165 Type 2 diabetes mellitus with hyperglycemia: Secondary | ICD-10-CM | POA: Diagnosis not present

## 2016-05-11 DIAGNOSIS — F329 Major depressive disorder, single episode, unspecified: Secondary | ICD-10-CM | POA: Diagnosis present

## 2016-05-11 DIAGNOSIS — L03115 Cellulitis of right lower limb: Secondary | ICD-10-CM | POA: Diagnosis not present

## 2016-05-11 DIAGNOSIS — I160 Hypertensive urgency: Secondary | ICD-10-CM | POA: Diagnosis not present

## 2016-05-11 DIAGNOSIS — H5462 Unqualified visual loss, left eye, normal vision right eye: Secondary | ICD-10-CM | POA: Diagnosis present

## 2016-05-11 DIAGNOSIS — I70234 Atherosclerosis of native arteries of right leg with ulceration of heel and midfoot: Secondary | ICD-10-CM | POA: Diagnosis not present

## 2016-05-11 DIAGNOSIS — I130 Hypertensive heart and chronic kidney disease with heart failure and stage 1 through stage 4 chronic kidney disease, or unspecified chronic kidney disease: Secondary | ICD-10-CM | POA: Diagnosis present

## 2016-05-11 DIAGNOSIS — N179 Acute kidney failure, unspecified: Secondary | ICD-10-CM | POA: Diagnosis not present

## 2016-05-11 DIAGNOSIS — E1151 Type 2 diabetes mellitus with diabetic peripheral angiopathy without gangrene: Secondary | ICD-10-CM | POA: Diagnosis present

## 2016-05-11 DIAGNOSIS — R339 Retention of urine, unspecified: Secondary | ICD-10-CM | POA: Diagnosis present

## 2016-05-11 DIAGNOSIS — E1159 Type 2 diabetes mellitus with other circulatory complications: Secondary | ICD-10-CM | POA: Diagnosis not present

## 2016-05-11 DIAGNOSIS — I248 Other forms of acute ischemic heart disease: Secondary | ICD-10-CM | POA: Diagnosis present

## 2016-05-11 DIAGNOSIS — R0902 Hypoxemia: Secondary | ICD-10-CM

## 2016-05-11 DIAGNOSIS — G629 Polyneuropathy, unspecified: Secondary | ICD-10-CM | POA: Diagnosis not present

## 2016-05-11 DIAGNOSIS — I69351 Hemiplegia and hemiparesis following cerebral infarction affecting right dominant side: Secondary | ICD-10-CM | POA: Diagnosis not present

## 2016-05-11 DIAGNOSIS — L89322 Pressure ulcer of left buttock, stage 2: Secondary | ICD-10-CM | POA: Diagnosis present

## 2016-05-11 LAB — ECHOCARDIOGRAM COMPLETE
Height: 69 in
Weight: 3416 oz

## 2016-05-11 LAB — HIV ANTIBODY (ROUTINE TESTING W REFLEX): HIV SCREEN 4TH GENERATION: NONREACTIVE

## 2016-05-11 MED ORDER — GLUCERNA SHAKE PO LIQD
237.0000 mL | Freq: Three times a day (TID) | ORAL | Status: DC
  Administered 2016-05-11 – 2016-05-12 (×2): 237 mL via ORAL

## 2016-05-11 MED ORDER — SODIUM CHLORIDE 0.9 % IV SOLN
INTRAVENOUS | Status: DC
  Administered 2016-05-11: 10:00:00 via INTRAVENOUS

## 2016-05-11 MED ORDER — RESOURCE THICKENUP CLEAR PO POWD
Freq: Once | ORAL | Status: AC
  Administered 2016-05-11: 11:00:00 via ORAL
  Filled 2016-05-11: qty 125

## 2016-05-11 MED FILL — Nutritional Supplement Liquid: ORAL | Qty: 237 | Status: AC

## 2016-05-11 NOTE — Consult Note (Signed)
   [facility] Inpatient Consult   05/11/2016  [name] March 31, 1955 [id number]  Patient screened for potential [place] [facility] services. Patient is a Medicare [facility] beneficiary plan.  Chart review the Patient is a 61 year old male with history of adrenal insufficiency, blind left eye, CHF, diabetes, hypertension, CAD, peripheral neuropathy.  Patient's family was seeking long term skilled facility due to patient reported as total care. No current [facility] community follow up noted. For questions contact:   Natividad Brood, RN BSN [facility] Liaison  [phone] business mobile phone Toll free office [phone]

## 2016-05-11 NOTE — Progress Notes (Signed)
[facility]                                                                              Patient Demographics  [name], is a 61 y.o. male, DOB - 10/03/1955, OYD:[id number]  Admit date - 05/10/2016   Admitting Physician Elwin Mocha, MD  Outpatient Primary MD for the patient is James Austria, MD  Outpatient specialists:   LOS - 0  days    Chief Complaint  Patient presents with  . Emesis       Brief summary   Patient is a 61 year old male with history of adrenal insufficiency, blind left eye, CHF, diabetes, hypertension, CAD, peripheral neuropathy, sent from skilled nursing facility for vomiting. Patient was not able to provide any history. Patient was found to be hypoxic, serial troponin was slightly positive and patient was admitted for further workup.   Assessment & Plan    Principal Problem: Acute hypoxic respiratory failure: Unclear etiology possibly due to aspiration from multiple episodes of vomiting - Patient is already on xarelto hence PE less likely - Mild troponin elevation possibly due to hypoxia, denies any chest pain - Follow 2-D echo, BNP 53 - Currently respiratory failure resolved, O2 sats 95 200% on room air -CT chest on 3/15 showed no acute findings, no evidence of pneumonia or aspiration   Active problems Mild troponin anemia likely due to hypoxia - EKG with mild abnormalities in V2 V3  - Follow 2-D echo, cardiology consulted  Hx of VTE Well's score PE 3.0, low risk; <4 is unlikely Pt already on xarelto, will continue    Skin breakdown Wound care consult   Acute kidney injury on chronic kidney disease stage II -III - Cr at baseline,  1.68 - Gentle hydration for 1 L and then saline lock, hold diuretics today    Nausea and vomiting  - Unclear etiology, abdominal x-rays negative for any SBO or ileus - SLP evaluation to rule out dysphagia - For now full liquid diet  Depression Cont prozac,  remeron  Diabetes mellitus   continue Glargine 60u qhsAnd sliding scale insulin   Chronic constipation Daily MiraLAX  Hypertension - Continue Norvasc, clonidine, BiDil, Coreg  - Hold losartan  Urinary retention Cont Urecholine, flomax  Hyperlipidemia Continue statin   Code Status: Full CODE STATUS  DVT Prophylaxis:  xarelto Family Communication: Discussed in detail with the patient, all imaging results, lab results explained to the patient   Disposition Plan: Possibility dc in a.m.  Time Spent in minutes  25 minutes  Procedures:  CT chest  Consultants:   Cardiology  Antimicrobials:      Medications  Scheduled Meds: . amLODipine  10 mg Oral Daily  . aspirin  325 mg Oral Daily  . atorvastatin  40 mg Oral q1800  . bethanechol  10 mg Oral TID  . carvedilol  25 mg Oral BID WC  . cloNIDine  0.3 mg Oral TID  . FLUoxetine  10 mg Oral Daily  . GLUCERNA  237 mL Oral QID  . insulin glargine  60 Units Subcutaneous QHS  . isosorbide-hydrALAZINE  2 tablet Oral TID  . losartan  100 mg Oral Daily  . mirtazapine  7.5 mg Oral QHS  . polyethylene glycol  17 g Oral Daily  . [place]   Oral Once  . rivaroxaban  20 mg Oral Q supper  . tamsulosin  0.4 mg Oral QPC supper  . zinc sulfate  220 mg Oral Daily   Continuous Infusions: . sodium chloride 75 mL/hr at 05/11/16 1003   PRN Meds:.acetaminophen, albuterol, bisacodyl, morphine injection, nitroGLYCERIN, ondansetron (ZOFRAN) IV   Antibiotics   Anti-infectives    None        Subjective:   [name] was seen and examined today.  Difficult to obtain review of systems from the patient, does not verbalize anything except nods yes or no. However per previous shift nurse, patient intermittently talks to wherever he wants to.  No acute events overnight.    Objective:   Vitals:   05/10/16 1953 05/11/16 0100 05/11/16 0709 05/11/16 0839  BP: (!) 162/70 (!) 148/66 (!) 157/64 (!) 149/63  Pulse: 79  78 73 72  Resp: 18 17 18 18   Temp: 98.5 F (36.9 C) 99.9 F (37.7 C) 98.2 F (36.8 C)   TempSrc: Oral Oral Oral   SpO2: 95% 95% 100% 95%  Weight:   96.8 kg (213 lb 8 oz)   Height:        Intake/Output Summary (Last 24 hours) at 05/11/16 1043 Last data filed at 05/11/16 0900  Gross per 24 hour  Intake              534 ml  Output              575 ml  Net              -41 ml     Wt Readings from Last 3 Encounters:  05/11/16 96.8 kg (213 lb 8 oz)  05/04/16 104.3 kg (230 lb)  04/27/16 110.2 kg (242 lb 15.2 oz)     Exam  General: Alert andAwake, NAD   HEENT:    Neck: Supple, no JVD  Cardiovascular: S1 S2 auscultated, no rubs, murmurs or gallops. Regular rate and rhythm.  Respiratory: Fairly clear to auscultation bilaterally  Gastrointestinal: Soft, nontender, nondistended, + bowel sounds  Ext: no cyanosis clubbing or edema  Neuro:did not cooperate   Skin: Dressing intact on both feet   Psych:  alert and awake  Data Reviewed:  I have personally reviewed following labs and imaging studies  Micro Results No results found for this or any previous visit (from the past 240 hour(s)).  Radiology Reports Dg Chest 2 View  Result Date: 05/10/2016 CLINICAL DATA:  Weakness. EXAM: CHEST  2 VIEW COMPARISON:  03/29/2016. FINDINGS: Cardiomegaly with normal pulmonary vascularity. Mild interstitial prominence. Mild CHF cannot be excluded P Low lung volumes. No pleural effusion or pneumothorax. IMPRESSION: Cardiomegaly with mild interstitial prominence. Mild CHF cannot be excluded. Low lung volumes. Electronically Signed   By: Marcello Moores  Register   On: 05/10/2016 12:16   Dg Shoulder Right  Result Date: 04/24/2016 CLINICAL DATA:  Pain after falling from chair. EXAM: RIGHT SHOULDER - 2+ VIEW COMPARISON:  None. FINDINGS: No acute fracture. No dislocations. Osteoarthritic joint space narrowing of the AC and glenohumeral joints. Soft tissue calcification adjacent to greater tuberosity may  reflect rotator cuff tendinopathy. Mild tug related muscle insertional change noted of the proximal humerus. IMPRESSION: 1. Osteoarthritis of the [facility] and glenohumeral joints. 2. No acute fracture or bone destruction. 3. Calcific rotator cuff tendinopathy. Electronically  Signed   By: Ashley Royalty M.D.   On: 04/24/2016 19:32   Dg Elbow 2 Views Right  Result Date: 04/24/2016 CLINICAL DATA:  Pain after fall from chair EXAM: RIGHT ELBOW - 2 VIEW COMPARISON:  None. FINDINGS: Soft tissue swelling and induration posterior to the elbow joint without acute fracture or malalignment. No definite joint effusion. The lateral view is slightly oblique limiting assessment for joint effusions. IMPRESSION: Negative for acute fracture or malalignment. Dorsal soft tissue swelling of the distal arm and proximal forearm. Electronically Signed   By: Ashley Royalty M.D.   On: 04/24/2016 19:36   Ct Head Wo Contrast  Result Date: 04/24/2016 CLINICAL DATA:  Patient fell from chair.  Unwitnessed fall. EXAM: CT HEAD WITHOUT CONTRAST TECHNIQUE: Contiguous axial images were obtained from the base of the skull through the vertex without intravenous contrast. COMPARISON:  04/19/2016 CT FINDINGS: BRAIN: There is sulcal and ventricular prominence consistent with superficial and central atrophy. No intraparenchymal hemorrhage, mass effect nor midline shift. Periventricular and subcortical white matter hypodensities consistent with a moderate degree of chronic small vessel ischemic disease are identified. Chronic left pontine and bilateral basal ganglial lacunar infarcts. No acute large vascular territory infarcts. No abnormal extra-axial fluid collections. Basal cisterns are not effaced and midline. VASCULAR: Moderate calcific atherosclerosis of the carotid siphons. SKULL: No skull fracture. No significant scalp soft tissue swelling. SINUSES/ORBITS: The mastoid air-cells are clear. The included paranasal sinuses are well-aerated.The included ocular  globes and orbital contents are non-suspicious. Bilateral lens replacements surgeries. OTHER: None. IMPRESSION: Chronic stable moderate small vessel ischemic disease of periventricular and subcortical white matter. Chronic left pontine and bilateral basal ganglial lacunar infarcts. No acute intracranial abnormality nor fracture. Electronically Signed   By: Ashley Royalty M.D.   On: 04/24/2016 19:39   Ct Head Wo Contrast  Result Date: 04/19/2016 CLINICAL DATA:  61 y/o M; history of brainstem stroke for follow-up. EXAM: CT HEAD WITHOUT CONTRAST TECHNIQUE: Contiguous axial images were obtained from the base of the skull through the vertex without intravenous contrast. COMPARISON:  03/30/2016 MRI of the head. FINDINGS: Brain: No evidence of acute infarction, hemorrhage, hydrocephalus, extra-axial collection or mass lesion/mass effect. Foci of hypoattenuation in subcortical and periventricular white matter are stable and compatible with moderate chronic microvascular ischemic changes. Stable mild brain parenchymal volume loss. Stable chronic lacunar infarcts in the basal ganglia and left hemi pons. Slight increased hypoattenuation within the left hemi pons probably corresponds to infarct on prior MRI. Vascular: Calcific atherosclerosis of cavernous and paraclinoid internal carotid arteries. Skull: Normal. Negative for fracture or focal lesion. Sinuses/Orbits: No acute finding. Small stable calcific focus within the left lateral globe and abnormal vitreous signal on prior MRIs likely representing chronic retinal detachment. Other: None. IMPRESSION: 1. No new acute intracranial abnormality. 2. Stable moderate chronic microvascular ischemic changes and mild brain parenchymal volume loss. Crown lacunar infarcts and pons and basal ganglia. 3. Slight increased hypoattenuation within left hemi pons probably corresponding to infarct on prior MRI. Electronically Signed   By: Kristine Garbe M.D.   On: 04/19/2016 14:26    Ct Chest Wo Contrast  Result Date: 05/10/2016 CLINICAL DATA:  Vomiting.  Mental status changes. EXAM: CT CHEST WITHOUT CONTRAST TECHNIQUE: Multidetector CT imaging of the chest was performed following the standard protocol without IV contrast. COMPARISON:  Chest radiography same day and multiple previous. FINDINGS: Cardiovascular: Mild cardiomegaly. No pericardial effusion. Aortic atherosclerosis without aneurysm. Extensive coronary artery calcification. Mediastinum/Nodes: No mediastinal mass or lymphadenopathy. Lungs/Pleura: No  pleural fluid. Poor inspiration. Allowing for that, the lungs are clear. No evidence of edema, consolidation or collapse. No evidence of aspiration. Upper Abdomen: Previous cholecystectomy.  No acute finding. Musculoskeletal: Chronic degenerative changes of the thoracic spine. IMPRESSION: No acute finding by CT. Cardiomegaly. Coronary artery calcification. Aortic atherosclerosis. No evidence of pneumonia or aspiration. Electronically Signed   By: Nelson Chimes M.D.   On: 05/10/2016 16:17   Dg Abd 2 Views  Result Date: 05/10/2016 CLINICAL DATA:  Nausea and vomiting EXAM: ABDOMEN - 2 VIEW COMPARISON:  None. FINDINGS: Scattered large and small bowel gas is noted. No acute mass is noted. Some scattered densities are noted over both kidneys likely related to vascular calcifications. Mild degenerative changes of the lumbar spine are seen. No free air is noted. IMPRESSION: No acute abnormality noted. Electronically Signed   By: Inez Catalina M.D.   On: 05/10/2016 16:12   Dg Swallowing Func-speech Pathology  Result Date: 04/25/2016 Objective Swallowing Evaluation: Type of Study: MBS-Modified Barium Swallow Study Patient Details Name: CANE DUBRAY MRN: [id number] Date of Birth: May 30, 1955 Today's Date: 04/25/2016 Time: No Data Recorded-No Data Recorded No Data Recorded Past Medical History: Past Medical History: Diagnosis Date . Adrenal insufficiency ([place])  . Allergy  . Arthritis  . Blind  left eye  . Carpal tunnel syndrome  . Cataract   bil removed . CHF (congestive heart failure) ([place])  . Diabetes mellitus without complication ([place])  . ED (erectile dysfunction)  . Gout  . Herpes  . Hypercholesterolemia  . Hypertension  . MI (myocardial infarction)  . Peripheral neuropathy ([place])  . Pneumonia  . PVD (peripheral vascular disease) ([place])  Past Surgical History: Past Surgical History: Procedure Laterality Date . CHOLECYSTECTOMY   . dilatera cataracts removed   . right knee surgary   . stents   HPI: Pt is a 61 y.o. male with PMH of diabetes mellitus type 2, hypertension, CHF, DVT presented to ER on 2/1 because of increasing numbness of the right upper and lower extremity. MRI showed no acute process; multiple old supra- and infratentorial small vessel infarcts, moderate to severe chronic small vessel ischemic disease. Pt passed RN swallow screen. Neuro exam showed no evidence of aphasia. Speech-language eval ordered as part of stroke workup. No Data Recorded Assessment / Plan / Recommendation CHL IP CLINICAL IMPRESSIONS 04/25/2016 Clinical Impression Pt presents with mild to moderate oral phase deficits c/b decreased bolus manipulation and decreased AP transfer resulting in increased oral prep times and peicemeal swallowing of dysphagia 2 textures. Pt with mild to moderate pharyngeal phase dysphagia c/b delayed swallow to pyriform sinuses for all liquid consistencies assessed - nectar thick liquids, honey thick liquids and thin liquids. Pt with swallow initiation at vallecula for trials of dysphagia 2. No penetration or aspiration observed. However, at bedside pt with significant coughing and vocal changes on consistent trials of ice chips, thin liquids via spoon and cup. Of note, pt with neck extension during Modified to aid in AP transfer of all consistencies and pt continues to present with decreased mentation. Given this information, pt is at mild aspiration risk and recommend conversative diet of  dysphagia 2 with necatr thick liquids with supervised trials of thin liquids at bedside following oral care.   SLP Visit Diagnosis Dysphagia, oropharyngeal phase (R13.12) Attention and concentration deficit following -- Frontal lobe and executive function deficit following -- Impact on safety and function Mild aspiration risk   CHL IP TREATMENT RECOMMENDATION 04/25/2016 Treatment Recommendations Therapy as outlined in  treatment plan below   Prognosis 04/25/2016 Prognosis for Safe Diet Advancement Guarded Barriers to Reach Goals Cognitive deficits;Motivation;Time post onset;Severity of deficits Barriers/Prognosis Comment -- CHL IP DIET RECOMMENDATION 04/25/2016 SLP Diet Recommendations Dysphagia 2 (Fine chop) solids;Nectar thick liquid Liquid Administration via Cup Medication Administration Crushed with puree Compensations Minimize environmental distractions;Slow rate;Small sips/bites Postural Changes Seated upright at 90 degrees   CHL IP OTHER RECOMMENDATIONS 04/25/2016 Recommended Consults -- Oral Care Recommendations Oral care BID Other Recommendations Order thickener from pharmacy;Prohibited food (jello, ice cream, thin soups);Remove water pitcher   CHL IP FOLLOW UP RECOMMENDATIONS 04/25/2016 Follow up Recommendations Skilled Nursing facility   No flowsheet data found.     CHL IP ORAL PHASE 04/25/2016 Oral Phase -- Oral - Pudding Teaspoon -- Oral - Pudding Cup -- Oral - Honey Teaspoon -- Oral - Honey Cup -- Oral - Nectar Teaspoon -- Oral - Nectar Cup -- Oral - Nectar Straw -- Oral - Thin Teaspoon -- Oral - Thin Cup -- Oral - Thin Straw -- Oral - Puree -- Oral - Mech Soft -- Oral - Regular -- Oral - Multi-Consistency -- Oral - Pill -- Oral Phase - Comment Pt with neck extension to aid in AP transfer of all boluses assessed  CHL IP PHARYNGEAL PHASE 04/25/2016 Pharyngeal Phase -- Pharyngeal- Pudding Teaspoon -- Pharyngeal -- Pharyngeal- Pudding Cup -- Pharyngeal -- Pharyngeal- Honey Teaspoon Reduced anterior laryngeal  mobility;Reduced laryngeal elevation;Reduced tongue base retraction;Delayed swallow initiation-pyriform sinuses;Pharyngeal residue - valleculae Pharyngeal -- Pharyngeal- Honey Cup -- Pharyngeal -- Pharyngeal- Nectar Teaspoon Delayed swallow initiation-pyriform sinuses;Reduced anterior laryngeal mobility;Reduced laryngeal elevation;Lateral channel residue Pharyngeal -- Pharyngeal- Nectar Cup Delayed swallow initiation-pyriform sinuses;Reduced anterior laryngeal mobility;Reduced laryngeal elevation;Reduced tongue base retraction;Lateral channel residue Pharyngeal -- Pharyngeal- Nectar Straw -- Pharyngeal -- Pharyngeal- Thin Teaspoon Delayed swallow initiation-pyriform sinuses;Reduced anterior laryngeal mobility;Reduced laryngeal elevation;Reduced tongue base retraction;Lateral channel residue Pharyngeal -- Pharyngeal- Thin Cup -- Pharyngeal -- Pharyngeal- Thin Straw -- Pharyngeal -- Pharyngeal- Puree -- Pharyngeal -- Pharyngeal- Mechanical Soft Delayed swallow initiation-vallecula;Reduced laryngeal elevation;Reduced anterior laryngeal mobility;Reduced tongue base retraction;Lateral channel residue Pharyngeal -- Pharyngeal- Regular -- Pharyngeal -- Pharyngeal- Multi-consistency -- Pharyngeal -- Pharyngeal- Pill -- Pharyngeal -- Pharyngeal Comment --  CHL IP CERVICAL ESOPHAGEAL PHASE 04/25/2016 Cervical Esophageal Phase WFL Pudding Teaspoon -- Pudding Cup -- Honey Teaspoon -- Honey Cup -- Nectar Teaspoon -- Nectar Cup -- Nectar Straw -- Thin Teaspoon -- Thin Cup -- Thin Straw -- Puree -- Mechanical Soft -- Regular -- Multi-consistency -- Pill -- Cervical Esophageal Comment -- No flowsheet data found. Happi Overton 04/25/2016, 2:04 PM              Dg Hip Unilat With Pelvis 2-3 Views Right  Result Date: 04/24/2016 CLINICAL DATA:  Right hip pain EXAM: DG HIP (WITH OR WITHOUT PELVIS) 2-3V RIGHT COMPARISON:  None. FINDINGS: There is no evidence of hip fracture or dislocation. Hip joints are maintained bilaterally without  significant joint space narrowing. The bony pelvis appears intact. There is sclerosis along the expected location of the L5-S1 facets consistent with lumbar facet arthropathy. SI joints and pubic symphysis are maintained. Ileofemoral and branch vessel vascular calcifications are noted bilaterally. IMPRESSION: No acute osseous abnormality identified of the bony pelvis and either hip. L5-S1 facet arthropathy. Vascular calcifications as above described. Electronically Signed   By: Ashley Royalty M.D.   On: 04/24/2016 19:35    Lab Data:  CBC:  Recent Labs Lab 05/10/16 1027  WBC 10.5  NEUTROABS 7.4  HGB 12.6*  HCT 39.1  MCV 89.9  PLT 656   Basic Metabolic Panel:  Recent Labs Lab 05/10/16 1027  NA 141  K 3.8  CL 104  CO2 27  GLUCOSE 148*  BUN 65*  CREATININE 2.05*  CALCIUM 8.6*   GFR: Estimated Creatinine Clearance: 44 mL/min (A) (by C-G formula based on SCr of 2.05 mg/dL (H)). Liver Function Tests:  Recent Labs Lab 05/10/16 1027  AST 131*  ALT 138*  ALKPHOS 70  BILITOT 0.5  PROT 8.1  ALBUMIN 2.7*    Recent Labs Lab 05/10/16 1027  LIPASE 27   No results for input(s): AMMONIA in the last 168 hours. Coagulation Profile:  Recent Labs Lab 05/10/16 1027  INR 1.21   Cardiac Enzymes:  Recent Labs Lab 05/10/16 1027 05/10/16 1333 05/10/16 1815 05/10/16 2106  TROPONINI 0.09* 0.08* 0.10* 0.08*   BNP (last 3 results) No results for input(s): PROBNP in the last 8760 hours. HbA1C: No results for input(s): HGBA1C in the last 72 hours. CBG:  Recent Labs Lab 05/10/16 1015 05/10/16 2056  GLUCAP 134* 239*   Lipid Profile: No results for input(s): CHOL, HDL, LDLCALC, TRIG, CHOLHDL, LDLDIRECT in the last 72 hours. Thyroid Function Tests: No results for input(s): TSH, T4TOTAL, FREET4, T3FREE, THYROIDAB in the last 72 hours. Anemia Panel: No results for input(s): VITAMINB12, FOLATE, FERRITIN, TIBC, IRON, RETICCTPCT in the last 72 hours. Urine analysis:     Component Value Date/Time   COLORURINE YELLOW 05/10/2016 1123   APPEARANCEUR CLEAR 05/10/2016 1123   LABSPEC 1.011 05/10/2016 1123   PHURINE 5.0 05/10/2016 1123   GLUCOSEU NEGATIVE 05/10/2016 1123   HGBUR NEGATIVE 05/10/2016 1123   BILIRUBINUR NEGATIVE 05/10/2016 1123   KETONESUR NEGATIVE 05/10/2016 1123   PROTEINUR NEGATIVE 05/10/2016 1123   UROBILINOGEN 0.2 07/12/2014 1228   NITRITE NEGATIVE 05/10/2016 [place] 05/10/2016 [place] M.D. [facility] 05/11/2016, 10:43 AM  Pager: [phone] Between 7am to 7pm - call Pager - [phone]  After 7pm go to [email or web address] - password [email or web address]  Call night coverage person covering after 7pm

## 2016-05-11 NOTE — Clinical Social Work Note (Signed)
Clinical Social Work Assessment  Patient Details  Name: [name] MRN: [id number] Date of Birth: 08-11-1955  Date of referral:  05/11/16               Reason for consult:  Discharge Planning (from facility [facility])                Permission sought to share information with:  Case Manager, Customer service manager, Family Supports Permission granted to share information::  Yes, Verbal Permission Granted  Name::        Agency::  [facility]  Relationship::  Daughter [name]  [phone]  Contact Information:     Housing/Transportation Living arrangements for the past 2 months:  [place] of Information:  Patient, Medical Team, Case Manager, Facility, Adult Children Patient Interpreter Needed:  None Criminal Activity/Legal Involvement Pertinent to Current Situation/Hospitalization:  No - Comment as needed Significant Relationships:  Adult Children, Other Family Members, Community Support Lives with:  Facility Resident Do you feel safe going back to the place where you live?  Yes Need for family participation in patient care:  Yes (Comment)  Care giving concerns:  Daughter reports patient is a current at [facility] where he will return at discharge. Daughter very positive about care at SNF, was unable to financially hold his bed, but reports she has spoken to facility and patient can return. Call placed to facility in reference of admission and will plan for SNF return.  Daughter reports patient is alert and oriented, however the Stroke has made him depressed. She reports prior to stroke patient was independent, talkative, and the stroke has taken a lot a way from him and he is struggling to find his place and understand his new baseline.  Reports he can be very stubborn and she feels he wants to give up because he does not like having to need so much help.  Reports he is on medication for depression (Prozac, Remeron) and LCSW offered a referral for  outpatient and daughter is interested, but wants to speak with patient first prior to referral.    Social Worker assessment / plan:  LCSW completed consult with daughter via phone [name]). Plan: Return back to SNF: [facility].  Will update FL2 and send clinicals to SNF. Will refer for outpatient psych if daughter and patient agreeable. Daughter reports she can follow up with patient and call herself.  Employment status:  Disabled (Comment on whether or not currently receiving Disability) Insurance information:  Medicare PT Recommendations:  [place] / Referral to community resources:  [place]  Patient/Family's Response to care:  Understanding and appreciative of call and information/update.  Patient/Family's Understanding of and Emotional Response to Diagnosis, Current Treatment, and Prognosis:  Daughter voices emotions related to her father verbalizing he wants to give up at times and states it is hard to see him like this.  Daughter understands reason for admission and current prognosis.  She voices that she is the main caregiver and will continue to fight for her father.  Emotional Assessment Appearance:  Appears stated age Attitude/Demeanor/Rapport:  Guarded, Avoidant Affect (typically observed):  Hopeless, Depressed Orientation:  Oriented to Self, Oriented to Place, Oriented to Situation Alcohol / Substance use:  Not Applicable Psych involvement (Current and /or in the community):  No (Comment)  Discharge Needs  Concerns to be addressed:  No discharge needs identified Readmission within the last 30 days:  Yes Current discharge risk:  None Barriers  to Discharge:  Continued Medical Work up   [name] 05/11/2016, 10:13 AM

## 2016-05-11 NOTE — [place] FL2 (Signed)
[place] MEDICAID FL2 LEVEL OF CARE SCREENING TOOL     IDENTIFICATION  Patient Name: [name] Birthdate: March 10, 1955 Sex: male Admission Date (Current Location): 05/10/2016  [facility] and [place] Number:  Herbalist and Address:  [facility]. [facility], [place] [address] [place], [place] [address]      Provider Number: [id number]  Attending Physician Name and Address:  Mendel Corning, MD  Relative Name and Phone Number:  Grafton Folk [phone]    Current Level of Care: Hospital Recommended Level of Care: [place] Prior Approval Number:    Date Approved/Denied:   PASRR Number: [id number]  Discharge Plan: SNF    Current Diagnoses: Patient Active Problem List   Diagnosis Date Noted  . Hypoxia 05/10/2016  . Elevated troponin 05/10/2016  . AKI (acute kidney injury) ([place]) 05/10/2016  . Pressure injury of skin 05/10/2016  . CKD (chronic kidney disease) stage 3, GFR 30-59 ml/min 04/28/2016  . Hypernatremia 04/28/2016  . Dysphagia due to recent stroke 04/28/2016  . Cystitis due to Pseudomonas 04/28/2016  . Enterococcus UTI 04/28/2016  . Hyperlipidemia associated with type 2 diabetes mellitus ([place]) 04/28/2016  . Depression 04/28/2016  . Urinary retention   . Accidental fall from chair   . FUO (fever of unknown origin)   . Cognitive and behavioral changes   . Cerebrovascular accident (CVA) due to embolism of left posterior cerebral artery ([place])   . Lethargy   . Acute on chronic renal insufficiency   . Low back pain   . Leukocytosis   . Labile blood glucose   . Hypoglycemia due to insulin   . UTI due to Klebsiella species   . Deep tissue injury   . Renovascular hypertension   . History of DVT (deep vein thrombosis)   . Coronary artery disease involving native coronary artery of native heart without angina pectoris   . Morbid obesity ([place])   . Stage 2 chronic kidney disease   . Hypokalemia   . Acute blood loss  anemia   . Embolic stroke involving posterior cerebral artery ([place]) 04/03/2016  . Brainstem stroke ([place]) 04/01/2016  . Abnormality of gait and mobility   . Cerebral infarction due to thrombosis of other cerebral artery ([place])   . Right arm numbness   . Left sided numbness 03/29/2016  . TIA (transient ischemic attack) 03/29/2016  . Hypertensive urgency 03/29/2016  . Sepsis ([place]) 09/10/2013  . DVT (deep venous thrombosis) ([place]) 01/01/2013  . Rash and nonspecific skin eruption 12/29/2012  . Cellulitis and abscess of leg 12/29/2012  . Diabetes type 2, uncontrolled ([place]) 12/29/2012  . Hypertension   . Cardiomyopathy ([place])   . CHF (congestive heart failure) ([place])   . Peripheral neuropathy ([place])   . Special screening for malignant neoplasms, colon 12/09/2012    Orientation RESPIRATION BLADDER Height & Weight     Self, Time, Situation, Place  Normal Incontinent Weight: 213 lb 8 oz (96.8 kg) Height:  5\' 9"  (175.3 cm)  BEHAVIORAL SYMPTOMS/MOOD NEUROLOGICAL BOWEL NUTRITION STATUS      Incontinent Diet (see DC summary (current nectar thick ))  AMBULATORY STATUS COMMUNICATION OF NEEDS Skin   Extensive Assist Verbally Other (Comment)   Reason for Consult: Left heel and sacral wounds Wound type: DTI, unstageable, and stage II Pressure Injury POA: Yes Measurement:Right medial Buttock two stage II wounds, 1.6cm x 1.3cm x 0.1, and 1.3cm x 1cm x 0.1cm, 70% red, 30% yellow, no drainage or odor noted Right lateral  heel DTI 2cm x 3cm no drainage or odor, also has a healed wound on posterior heel. Left Heel 3cm x 3.5cm wound that looks like had dried eschar but now is drainage foul smelling brown drainage and blood around the edges. Wound bed:see above Drainage (amount, consistency, odor) see above Periwound:intact Dressing procedure/placement/frequency: I have provided nurses with orders for Calcium Alginate to Left heel, foam to Right heel, Prevalon Boots to both feet. Foam to sacral ulcers, change  prn.                     Personal Care Assistance Level of Assistance  Bathing, Feeding, Dressing Bathing Assistance: Limited assistance Feeding assistance: Limited assistance Dressing Assistance: Limited assistance     Functional Limitations Info  Speech, Sight, Hearing Sight Info: Adequate Hearing Info: Adequate Speech Info: Impaired    SPECIAL CARE FACTORS FREQUENCY  PT (By licensed PT), OT (By licensed OT), Speech therapy, Bowel and bladder program     PT Frequency: 5 x a week OT Frequency: 5x a week     Speech Therapy Frequency: 5x a week      Contractures Contractures Info: Not present    Additional Factors Info  Code Status Code Status Info: Full Code Allergies Info: Iohexol, Ivp Dye Iodinated Diagnostic Agents           Current Medications (05/11/2016):  This is the current hospital active medication list Current Facility-Administered Medications  Medication Dose Route Frequency Provider Last Rate Last Dose  . 0.9 %  sodium chloride infusion   Intravenous Continuous Ripudeep Krystal Eaton, MD 75 mL/hr at 05/11/16 1003    . acetaminophen (TYLENOL) tablet 650 mg  650 mg Oral Q4H PRN Elwin Mocha, MD      . albuterol (PROVENTIL) (2.5 MG/3ML) 0.083% nebulizer solution 2.5 mg  2.5 mg Nebulization Q2H PRN Elwin Mocha, MD      . amLODipine (NORVASC) tablet 10 mg  10 mg Oral Daily Elwin Mocha, MD      . aspirin tablet 325 mg  325 mg Oral Daily Elwin Mocha, MD      . atorvastatin (LIPITOR) tablet 40 mg  40 mg Oral q1800 Elwin Mocha, MD   40 mg at 05/10/16 1838  . bethanechol (URECHOLINE) tablet 10 mg  10 mg Oral TID Elwin Mocha, MD   10 mg at 05/10/16 2312  . bisacodyl (DULCOLAX) suppository 10 mg  10 mg Rectal PRN Elwin Mocha, MD      . carvedilol (COREG) tablet 25 mg  25 mg Oral BID WC Elwin Mocha, MD   25 mg at 05/11/16 0849  . cloNIDine (CATAPRES) tablet 0.3 mg  0.3 mg Oral TID Elwin Mocha, MD   0.3 mg at 05/10/16 2317  . FLUoxetine  (PROZAC) capsule 10 mg  10 mg Oral Daily Elwin Mocha, MD      . GLUCERNA (GLUCERNA) liquid 237 mL  237 mL Oral QID Elwin Mocha, MD   237 mL at 05/10/16 2313  . insulin glargine (LANTUS) injection 60 Units  60 Units Subcutaneous QHS Elwin Mocha, MD   60 Units at 05/10/16 2316  . isosorbide-hydrALAZINE (BIDIL) 20-37.5 MG per tablet 2 tablet  2 tablet Oral TID Elwin Mocha, MD   2 tablet at 05/10/16 2312  . losartan (COZAAR) tablet 100 mg  100 mg Oral Daily Elwin Mocha, MD   100 mg at 05/10/16 1838  . mirtazapine (REMERON)  tablet 7.5 mg  7.5 mg Oral QHS Elwin Mocha, MD   7.5 mg at 05/10/16 2312  . morphine 4 MG/ML injection 2 mg  2 mg Intravenous Q2H PRN Elwin Mocha, MD      . nitroGLYCERIN (NITROSTAT) SL tablet 0.4 mg  0.4 mg Sublingual Q5 min PRN Elwin Mocha, MD      . ondansetron [facility]) injection 4 mg  4 mg Intravenous Q6H PRN Elwin Mocha, MD      . polyethylene glycol [facility] / GLYCOLAX) packet 17 g  17 g Oral Daily Elwin Mocha, MD      . rivaroxaban [facility]) tablet 20 mg  20 mg Oral Q supper Elwin Mocha, MD   20 mg at 05/10/16 1838  . tamsulosin (FLOMAX) capsule 0.4 mg  0.4 mg Oral QPC supper Elwin Mocha, MD   0.4 mg at 05/10/16 1838  . zinc sulfate capsule 220 mg  220 mg Oral Daily Elwin Mocha, MD         Discharge Medications: Please see discharge summary for a list of discharge medications.  Relevant Imaging Results:  Relevant Lab Results:   Additional Information SSN: [id number]  Lilly Cove, LCSW

## 2016-05-11 NOTE — Progress Notes (Signed)
Paged ortho to inform of pt new order for prevalon boots   [name]

## 2016-05-11 NOTE — Progress Notes (Signed)
  Echocardiogram 2D Echocardiogram has been performed.  [name] 05/11/2016, 4:19 PM

## 2016-05-11 NOTE — Progress Notes (Signed)
Paged speech therapy she stated pt had previously been on nectar thick. She stated she would be at bedside shortly  [name]

## 2016-05-11 NOTE — Consult Note (Signed)
[facility] Nurse wound consult note Reason for Consult: Left heel and sacral wounds Wound type: DTI, unstageable, and stage II Pressure Injury POA: Yes Measurement:Right medial Buttock two stage II wounds, 1.6cm x 1.3cm x 0.1, and 1.3cm x 1cm x 0.1cm, 70% red, 30% yellow, no drainage or odor noted Right lateral heel DTI 2cm x 3cm no drainage or odor, also has a healed wound on posterior heel. Left Heel 3cm x 3.5cm wound that looks like had dried eschar but now is drainage foul smelling brown drainage and blood around the edges. Wound bed:see above Drainage (amount, consistency, odor) see above Periwound:intact Dressing procedure/placement/frequency: I have provided nurses with orders for Calcium Alginate to Left heel, foam to Right heel, Prevalon Boots to both feet. Foam to sacral ulcers, change prn. We will not follow, but will remain available to this patient, to nursing, and the medical and/or surgical teams.  Please re-consult if we need to assist further.    Fara Olden, RN-C, WTA-C Wound Treatment Associate

## 2016-05-11 NOTE — Progress Notes (Signed)
Wound care completed on pt left heel. Cleansed with normal saline, placed calcium alginate and kerlex dressing. Bilateral prevalon boots applied Pt tolerated well  Beckie Viscardi Leory Plowman

## 2016-05-11 NOTE — Progress Notes (Signed)
Asked MD Rai for tele sitter, awaiting order  Attallah Ontko Leory Plowman

## 2016-05-11 NOTE — Progress Notes (Signed)
MD Rai asked for SLP order to see pt baseline. Pt coughed when given water with morning medications but did well with applesauce, MD aware  [name]

## 2016-05-11 NOTE — Progress Notes (Signed)
Second call made to [facility]. Talked to [facility], RN and she stated pt had received influenza and pneumococcal vaccinations.    Dennison Mcdaid

## 2016-05-11 NOTE — Progress Notes (Signed)
Pt responding to questions during assessment with grunts and sounds. Unable to finish admission [place]

## 2016-05-11 NOTE — Consult Note (Signed)
Cardiology Consult    Patient ID: [name] MRN: [id number], DOB/AGE: 61-Oct-1957   Admit date: 05/10/2016 Date of Consult: 05/11/2016  Primary Physician: [name], MD Reason for Consult: Elevated troponin Primary Cardiologist: New Dr. Debara Pickett Requesting Provider: Dr. Tana Coast   History of Present Illness    [name] is a 61 y.o. male with past medical history significant for Recent CVA (03/2016), adrenal insufficiency, CHF, Type 2 diabetes, hypertension, CAD (unable to find old records), DVT- on Xarelto, peripheral neuropathy, and blindness in left eye who presented to the [facility] ED on 05/10/16 from skilled nursing facility with complaints of vomiting.  The patient awoke yesterday morning multiple episodes of projectile vomiting with some green contents. The nursing staff sent him to the hospital. Currently on my exam he is found unclothed and uncovered. He is unable to provide any meaningful information on current curcumstances or review of systems. According to the IM H&P there was no chest pain or shortness of breath.  The patient was recently hospitalized 04/03/16-04/25/16 for left sided weakness and ataxia and found to have acute ischemic infarct within left paramedian pons. He was alreadyon Xarelto for hx DVT and this was continued for secondary stroke prevention. An echo on Showed mild LVH with global hypokinesis with akinesis of the inferolateral wall and an EF 25-30 %. It was recommended by Dr. Percival Spanish for the patient to have out patient perfusion study and BiDil and Coreg added for management of cardiomyopathy. He was discharged to skilled nursing facility. He was set up for a cardiology follow office visit on 4/10 and he has not had his perfusion study done yet.  EKG with non-specific changes Troponins 0.09, 0.08, 0.10, 0.08 Scr 2.05 BNP 53.8 CT Chest:  No acute finding by CT. Cardiomegaly. Coronary artery calcification. Aortic atherosclerosis. No evidence of  pneumonia or aspiration  Past Medical History   Past Medical History:  Diagnosis Date  . Adrenal insufficiency ([place])   . Allergy   . Arthritis   . Blind left eye   . Carpal tunnel syndrome   . Cataract    bil removed  . CHF (congestive heart failure) ([place])   . Diabetes mellitus without complication ([place])   . ED (erectile dysfunction)   . Gout   . Herpes   . Hypercholesterolemia   . Hypertension   . MI (myocardial infarction)   . Nausea & vomiting   . Peripheral neuropathy ([place])   . Pneumonia   . PVD (peripheral vascular disease) ([place])     Past Surgical History:  Procedure Laterality Date  . CHOLECYSTECTOMY    . CORONARY ANGIOPLASTY WITH STENT PLACEMENT  2007   two 2.5  x 13 mm Cypher stents to the RCA  . dilatera cataracts removed    . right knee surgary       Allergies  Allergies  Allergen Reactions  . Iohexol Hives  . Ivp Dye [Iodinated Diagnostic Agents] Hives    Inpatient Medications    . amLODipine  10 mg Oral Daily  . aspirin  325 mg Oral Daily  . atorvastatin  40 mg Oral q1800  . bethanechol  10 mg Oral TID  . carvedilol  25 mg Oral BID WC  . cloNIDine  0.3 mg Oral TID  . FLUoxetine  10 mg Oral Daily  . GLUCERNA  237 mL Oral QID  . insulin glargine  60 Units Subcutaneous QHS  . isosorbide-hydrALAZINE  2 tablet Oral TID  . mirtazapine  7.5 mg Oral QHS  . polyethylene glycol  17 g Oral Daily  . [place]   Oral Once  . rivaroxaban  20 mg Oral Q supper  . tamsulosin  0.4 mg Oral QPC supper  . zinc sulfate  220 mg Oral Daily    Family History    Family History  Problem Relation Age of Onset  . Breast cancer Mother   . Hypertension Father     blood clot in leg  . Diabetes Maternal Aunt   . Heart attack Sister   . Colon cancer Neg Hx   . Esophageal cancer Neg Hx   . Rectal cancer Neg Hx   . Stomach cancer Neg Hx     Social History    Social History   Social History  . Marital status: Married    Spouse name: N/A  .  Number of children: N/A  . Years of education: N/A   Occupational History  . Not on file.   Social History Main Topics  . Smoking status: Never Smoker  . Smokeless tobacco: Never Used  . Alcohol use No  . Drug use: No  . Sexual activity: No   Other Topics Concern  . Not on file   Social History Narrative  . No narrative on file     Review of Systems   Unable to obtain information from patient due to confusion. He says that he feels bad all over. Does not answer any specific questions.   Physical Exam    Blood pressure (!) 149/63, pulse 72, temperature 98.2 F (36.8 C), temperature source Oral, resp. rate 18, height 5\' 9"  (1.753 m), weight 213 lb 8 oz (96.8 kg), SpO2 95 %.  General: Confused, NAD Psych: Normal affect. Neuro: Alert and oriented to self.  HEENT: Normal  Neck: Supple without bruits or JVD. Lungs:  Resp regular and unlabored, CTA. Heart: RRR no s3, s4, or murmurs. Abdomen: Soft, non-tender, non-distended, BS + x 4.  Extremities: No clubbing, cyanosis or edema. DP/PT/Radials 2+ and equal bilaterally.  Labs    Troponin (Point of Care Test) No results for input(s): TROPIPOC in the last 72 hours.  Recent Labs  05/10/16 1027 05/10/16 1333 05/10/16 1815 05/10/16 2106  TROPONINI 0.09* 0.08* 0.10* 0.08*   Lab Results  Component Value Date   WBC 10.5 05/10/2016   HGB 12.6 (L) 05/10/2016   HCT 39.1 05/10/2016   MCV 89.9 05/10/2016   PLT 253 05/10/2016    Recent Labs Lab 05/10/16 1027  NA 141  K 3.8  CL 104  CO2 27  BUN 65*  CREATININE 2.05*  CALCIUM 8.6*  PROT 8.1  BILITOT 0.5  ALKPHOS 70  ALT 138*  AST 131*  GLUCOSE 148*   Lab Results  Component Value Date   CHOL 235 (H) 03/29/2016   HDL 36 (L) 03/29/2016   LDLCALC 167 (H) 03/29/2016   TRIG 160 (H) 03/29/2016   No results found for: [facility]   Radiology Studies    Dg Chest 2 View  Result Date: 05/10/2016 CLINICAL DATA:  Weakness. EXAM: CHEST  2 VIEW COMPARISON:  03/29/2016.  FINDINGS: Cardiomegaly with normal pulmonary vascularity. Mild interstitial prominence. Mild CHF cannot be excluded P Low lung volumes. No pleural effusion or pneumothorax. IMPRESSION: Cardiomegaly with mild interstitial prominence. Mild CHF cannot be excluded. Low lung volumes. Electronically Signed   By: Marcello Moores  Register   On: 05/10/2016 12:16   Dg Shoulder Right  Result Date: 04/24/2016 CLINICAL DATA:  Pain after falling from chair. EXAM: RIGHT SHOULDER - 2+ VIEW COMPARISON:  None. FINDINGS: No acute fracture. No dislocations. Osteoarthritic joint space narrowing of the AC and glenohumeral joints. Soft tissue calcification adjacent to greater tuberosity may reflect rotator cuff tendinopathy. Mild tug related muscle insertional change noted of the proximal humerus. IMPRESSION: 1. Osteoarthritis of the [facility] and glenohumeral joints. 2. No acute fracture or bone destruction. 3. Calcific rotator cuff tendinopathy. Electronically Signed   By: Ashley Royalty M.D.   On: 04/24/2016 19:32   Dg Elbow 2 Views Right  Result Date: 04/24/2016 CLINICAL DATA:  Pain after fall from chair EXAM: RIGHT ELBOW - 2 VIEW COMPARISON:  None. FINDINGS: Soft tissue swelling and induration posterior to the elbow joint without acute fracture or malalignment. No definite joint effusion. The lateral view is slightly oblique limiting assessment for joint effusions. IMPRESSION: Negative for acute fracture or malalignment. Dorsal soft tissue swelling of the distal arm and proximal forearm. Electronically Signed   By: Ashley Royalty M.D.   On: 04/24/2016 19:36   Ct Head Wo Contrast  Result Date: 04/24/2016 CLINICAL DATA:  Patient fell from chair.  Unwitnessed fall. EXAM: CT HEAD WITHOUT CONTRAST TECHNIQUE: Contiguous axial images were obtained from the base of the skull through the vertex without intravenous contrast. COMPARISON:  04/19/2016 CT FINDINGS: BRAIN: There is sulcal and ventricular prominence consistent with superficial and central  atrophy. No intraparenchymal hemorrhage, mass effect nor midline shift. Periventricular and subcortical white matter hypodensities consistent with a moderate degree of chronic small vessel ischemic disease are identified. Chronic left pontine and bilateral basal ganglial lacunar infarcts. No acute large vascular territory infarcts. No abnormal extra-axial fluid collections. Basal cisterns are not effaced and midline. VASCULAR: Moderate calcific atherosclerosis of the carotid siphons. SKULL: No skull fracture. No significant scalp soft tissue swelling. SINUSES/ORBITS: The mastoid air-cells are clear. The included paranasal sinuses are well-aerated.The included ocular globes and orbital contents are non-suspicious. Bilateral lens replacements surgeries. OTHER: None. IMPRESSION: Chronic stable moderate small vessel ischemic disease of periventricular and subcortical white matter. Chronic left pontine and bilateral basal ganglial lacunar infarcts. No acute intracranial abnormality nor fracture. Electronically Signed   By: Ashley Royalty M.D.   On: 04/24/2016 19:39   Ct Head Wo Contrast  Result Date: 04/19/2016 CLINICAL DATA:  61 y/o M; history of brainstem stroke for follow-up. EXAM: CT HEAD WITHOUT CONTRAST TECHNIQUE: Contiguous axial images were obtained from the base of the skull through the vertex without intravenous contrast. COMPARISON:  03/30/2016 MRI of the head. FINDINGS: Brain: No evidence of acute infarction, hemorrhage, hydrocephalus, extra-axial collection or mass lesion/mass effect. Foci of hypoattenuation in subcortical and periventricular white matter are stable and compatible with moderate chronic microvascular ischemic changes. Stable mild brain parenchymal volume loss. Stable chronic lacunar infarcts in the basal ganglia and left hemi pons. Slight increased hypoattenuation within the left hemi pons probably corresponds to infarct on prior MRI. Vascular: Calcific atherosclerosis of cavernous and  paraclinoid internal carotid arteries. Skull: Normal. Negative for fracture or focal lesion. Sinuses/Orbits: No acute finding. Small stable calcific focus within the left lateral globe and abnormal vitreous signal on prior MRIs likely representing chronic retinal detachment. Other: None. IMPRESSION: 1. No new acute intracranial abnormality. 2. Stable moderate chronic microvascular ischemic changes and mild brain parenchymal volume loss. Crown lacunar infarcts and pons and basal ganglia. 3. Slight increased hypoattenuation within left hemi pons probably corresponding to infarct on prior MRI. Electronically Signed   By: Edgardo Roys.D.  On: 04/19/2016 14:26   Ct Chest Wo Contrast  Result Date: 05/10/2016 CLINICAL DATA:  Vomiting.  Mental status changes. EXAM: CT CHEST WITHOUT CONTRAST TECHNIQUE: Multidetector CT imaging of the chest was performed following the standard protocol without IV contrast. COMPARISON:  Chest radiography same day and multiple previous. FINDINGS: Cardiovascular: Mild cardiomegaly. No pericardial effusion. Aortic atherosclerosis without aneurysm. Extensive coronary artery calcification. Mediastinum/Nodes: No mediastinal mass or lymphadenopathy. Lungs/Pleura: No pleural fluid. Poor inspiration. Allowing for that, the lungs are clear. No evidence of edema, consolidation or collapse. No evidence of aspiration. Upper Abdomen: Previous cholecystectomy.  No acute finding. Musculoskeletal: Chronic degenerative changes of the thoracic spine. IMPRESSION: No acute finding by CT. Cardiomegaly. Coronary artery calcification. Aortic atherosclerosis. No evidence of pneumonia or aspiration. Electronically Signed   By: Nelson Chimes M.D.   On: 05/10/2016 16:17   Dg Abd 2 Views  Result Date: 05/10/2016 CLINICAL DATA:  Nausea and vomiting EXAM: ABDOMEN - 2 VIEW COMPARISON:  None. FINDINGS: Scattered large and small bowel gas is noted. No acute mass is noted. Some scattered densities are  noted over both kidneys likely related to vascular calcifications. Mild degenerative changes of the lumbar spine are seen. No free air is noted. IMPRESSION: No acute abnormality noted. Electronically Signed   By: Inez Catalina M.D.   On: 05/10/2016 16:12   Dg Swallowing Func-speech Pathology  Result Date: 04/25/2016 Objective Swallowing Evaluation: Type of Study: MBS-Modified Barium Swallow Study Patient Details Name: JESSI JESSOP MRN: [id number] Date of Birth: 07/24/1955 Today's Date: 04/25/2016 Time: No Data Recorded-No Data Recorded No Data Recorded Past Medical History: Past Medical History: Diagnosis Date . Adrenal insufficiency ([place])  . Allergy  . Arthritis  . Blind left eye  . Carpal tunnel syndrome  . Cataract   bil removed . CHF (congestive heart failure) ([place])  . Diabetes mellitus without complication ([place])  . ED (erectile dysfunction)  . Gout  . Herpes  . Hypercholesterolemia  . Hypertension  . MI (myocardial infarction)  . Peripheral neuropathy ([place])  . Pneumonia  . PVD (peripheral vascular disease) ([place])  Past Surgical History: Past Surgical History: Procedure Laterality Date . CHOLECYSTECTOMY   . dilatera cataracts removed   . right knee surgary   . stents   HPI: Pt is a 61 y.o. male with PMH of diabetes mellitus type 2, hypertension, CHF, DVT presented to ER on 2/1 because of increasing numbness of the right upper and lower extremity. MRI showed no acute process; multiple old supra- and infratentorial small vessel infarcts, moderate to severe chronic small vessel ischemic disease. Pt passed RN swallow screen. Neuro exam showed no evidence of aphasia. Speech-language eval ordered as part of stroke workup. No Data Recorded Assessment / Plan / Recommendation CHL IP CLINICAL IMPRESSIONS 04/25/2016 Clinical Impression Pt presents with mild to moderate oral phase deficits c/b decreased bolus manipulation and decreased AP transfer resulting in increased oral prep times and peicemeal swallowing of dysphagia  2 textures. Pt with mild to moderate pharyngeal phase dysphagia c/b delayed swallow to pyriform sinuses for all liquid consistencies assessed - nectar thick liquids, honey thick liquids and thin liquids. Pt with swallow initiation at vallecula for trials of dysphagia 2. No penetration or aspiration observed. However, at bedside pt with significant coughing and vocal changes on consistent trials of ice chips, thin liquids via spoon and cup. Of note, pt with neck extension during Modified to aid in AP transfer of all consistencies and pt continues to present with decreased mentation. Given  this information, pt is at mild aspiration risk and recommend conversative diet of dysphagia 2 with necatr thick liquids with supervised trials of thin liquids at bedside following oral care.   SLP Visit Diagnosis Dysphagia, oropharyngeal phase (R13.12) Attention and concentration deficit following -- Frontal lobe and executive function deficit following -- Impact on safety and function Mild aspiration risk   CHL IP TREATMENT RECOMMENDATION 04/25/2016 Treatment Recommendations Therapy as outlined in treatment plan below   Prognosis 04/25/2016 Prognosis for Safe Diet Advancement Guarded Barriers to Reach Goals Cognitive deficits;Motivation;Time post onset;Severity of deficits Barriers/Prognosis Comment -- CHL IP DIET RECOMMENDATION 04/25/2016 SLP Diet Recommendations Dysphagia 2 (Fine chop) solids;Nectar thick liquid Liquid Administration via Cup Medication Administration Crushed with puree Compensations Minimize environmental distractions;Slow rate;Small sips/bites Postural Changes Seated upright at 90 degrees   CHL IP OTHER RECOMMENDATIONS 04/25/2016 Recommended Consults -- Oral Care Recommendations Oral care BID Other Recommendations Order thickener from pharmacy;Prohibited food (jello, ice cream, thin soups);Remove water pitcher   CHL IP FOLLOW UP RECOMMENDATIONS 04/25/2016 Follow up Recommendations Skilled Nursing facility   No  flowsheet data found.     CHL IP ORAL PHASE 04/25/2016 Oral Phase -- Oral - Pudding Teaspoon -- Oral - Pudding Cup -- Oral - Honey Teaspoon -- Oral - Honey Cup -- Oral - Nectar Teaspoon -- Oral - Nectar Cup -- Oral - Nectar Straw -- Oral - Thin Teaspoon -- Oral - Thin Cup -- Oral - Thin Straw -- Oral - Puree -- Oral - Mech Soft -- Oral - Regular -- Oral - Multi-Consistency -- Oral - Pill -- Oral Phase - Comment Pt with neck extension to aid in AP transfer of all boluses assessed  CHL IP PHARYNGEAL PHASE 04/25/2016 Pharyngeal Phase -- Pharyngeal- Pudding Teaspoon -- Pharyngeal -- Pharyngeal- Pudding Cup -- Pharyngeal -- Pharyngeal- Honey Teaspoon Reduced anterior laryngeal mobility;Reduced laryngeal elevation;Reduced tongue base retraction;Delayed swallow initiation-pyriform sinuses;Pharyngeal residue - valleculae Pharyngeal -- Pharyngeal- Honey Cup -- Pharyngeal -- Pharyngeal- Nectar Teaspoon Delayed swallow initiation-pyriform sinuses;Reduced anterior laryngeal mobility;Reduced laryngeal elevation;Lateral channel residue Pharyngeal -- Pharyngeal- Nectar Cup Delayed swallow initiation-pyriform sinuses;Reduced anterior laryngeal mobility;Reduced laryngeal elevation;Reduced tongue base retraction;Lateral channel residue Pharyngeal -- Pharyngeal- Nectar Straw -- Pharyngeal -- Pharyngeal- Thin Teaspoon Delayed swallow initiation-pyriform sinuses;Reduced anterior laryngeal mobility;Reduced laryngeal elevation;Reduced tongue base retraction;Lateral channel residue Pharyngeal -- Pharyngeal- Thin Cup -- Pharyngeal -- Pharyngeal- Thin Straw -- Pharyngeal -- Pharyngeal- Puree -- Pharyngeal -- Pharyngeal- Mechanical Soft Delayed swallow initiation-vallecula;Reduced laryngeal elevation;Reduced anterior laryngeal mobility;Reduced tongue base retraction;Lateral channel residue Pharyngeal -- Pharyngeal- Regular -- Pharyngeal -- Pharyngeal- Multi-consistency -- Pharyngeal -- Pharyngeal- Pill -- Pharyngeal -- Pharyngeal Comment --   CHL IP CERVICAL ESOPHAGEAL PHASE 04/25/2016 Cervical Esophageal Phase WFL Pudding Teaspoon -- Pudding Cup -- Honey Teaspoon -- Honey Cup -- Nectar Teaspoon -- Nectar Cup -- Nectar Straw -- Thin Teaspoon -- Thin Cup -- Thin Straw -- Puree -- Mechanical Soft -- Regular -- Multi-consistency -- Pill -- Cervical Esophageal Comment -- No flowsheet data found. Happi Overton 04/25/2016, 2:04 PM              Dg Hip Unilat With Pelvis 2-3 Views Right  Result Date: 04/24/2016 CLINICAL DATA:  Right hip pain EXAM: DG HIP (WITH OR WITHOUT PELVIS) 2-3V RIGHT COMPARISON:  None. FINDINGS: There is no evidence of hip fracture or dislocation. Hip joints are maintained bilaterally without significant joint space narrowing. The bony pelvis appears intact. There is sclerosis along the expected location of the L5-S1 facets consistent with lumbar facet arthropathy. SI joints  and pubic symphysis are maintained. Ileofemoral and branch vessel vascular calcifications are noted bilaterally. IMPRESSION: No acute osseous abnormality identified of the bony pelvis and either hip. L5-S1 facet arthropathy. Vascular calcifications as above described. Electronically Signed   By: Ashley Royalty M.D.   On: 04/24/2016 19:35    EKG & Cardiac Imaging   EKG: Normal sinus rhythm, 66 bpm, nonspecific ST changes (poor tracing)  Echocardiogram:  03/29/2016 Study Conclusions  - Left ventricle: The cavity size was mildly dilated. Wall   thickness was increased in a pattern of mild LVH. Systolic   function was severely reduced. The estimated ejection fraction   was in the range of 25% to 30%. Diffuse hypokinesis. There is   akinesis of the inferolateral myocardium. Doppler parameters are   consistent with abnormal left ventricular relaxation (grade 1   diastolic dysfunction). Doppler parameters are consistent with   high ventricular filling pressure. - Left atrium: The atrium was mildly dilated. - Right ventricle: Systolic function was moderately  reduced.  Impressions:  - Global hypokinesis with akinesis of the inferolateral wall;   overall severely reduced LV systolic function; grade 1 diastolic   dysfunction; elevated LV filliing pressure; mild LAE; RV not well   visualized but function appears moderately reduced.   Assessment & Plan    Elevated troponins -Pt presented with vomiting. Pt is confused and unable to provide much information. -Pt was found to have LV dysfunction and abnormal wall motion on echo in 2/18 during hospitalization for stroke and was to follow up with cardiology as outpatient, 4/10, with plans for perfusion study  -Troponins mildly elevated in flat pattern and likely related to demand ischemia. He had low O2 sat on admission which resolved without intervention which could have caused increase demand. Also could have been related to mild renal insufficiency. Although elevated SCr is likely related to overdiuresis.  -EKG with non-specific changes -Pt likely has some degree of CAD, but currently is stable and is not a good candidate for advanced procedure considering confusion and is recovering from recent stroke. Would treat medically.   Chronic systolic and diastolic heart failure -Pt with recent echo 03/2016 showing severely reduced LV systolic function with EF 25-30%, LVH, global hypokinesis with akinesis of inferolateral wall, grade 1 DD (found during hospitalization for stroke) -BNP 53.8, CXR with low lung volumes, CHF cannot be excluded, CT chest does not indicate fluid -Currently appears somewhat dry -Continue carvedilol and Bidil -Home meds include Lasix 80 mg,  I do not see need for diuretic at this time  AKI -Scr 2.05.  Baseline is 1.3-1.63 -SCr may be elevated due to lasix 80 mg and may be over diuresed. BUN 65. does not currently appear volume overloaded. Will hold diuretics. When resume lasix, consider lower dose. -IV fluids infusing.   HTN -Home meds include amlodipine 10 mg, coreg 25 mg  bid, clonidine 0.3 mg tid, bidil 2 tabs tid -BP is in fair control. Continue to monitor.   Diabetes -Managed by IM -Hgb A1c 11.0 on 03/29/2016 -Hopefully this will be better since he is now in SNF  Anticoagulation -With history of DVT and then stroke in 03/2016 -Continue Xarelto. Monitor renal function and adjust as needed.   Tildon Husky, NP-C 05/11/2016, 11:59 AM Pager: [phone]

## 2016-05-11 NOTE — Evaluation (Signed)
Clinical/Bedside Swallow Evaluation Patient Details  Name: [name] MRN: [id number] Date of Birth: 08-May-1955  Today's Date: 05/11/2016 Time: SLP Start Time (ACUTE ONLY): 1000 SLP Stop Time (ACUTE ONLY): 1020 SLP Time Calculation (min) (ACUTE ONLY): 20 min  Past Medical History:  Past Medical History:  Diagnosis Date  . Adrenal insufficiency ([place])   . Allergy   . Arthritis   . Blind left eye   . Carpal tunnel syndrome   . Cataract    bil removed  . CHF (congestive heart failure) ([place])   . Diabetes mellitus without complication ([place])   . ED (erectile dysfunction)   . Gout   . Herpes   . Hypercholesterolemia   . Hypertension   . MI (myocardial infarction)   . Nausea & vomiting   . Peripheral neuropathy ([place])   . Pneumonia   . PVD (peripheral vascular disease) ([place])    Past Surgical History:  Past Surgical History:  Procedure Laterality Date  . CHOLECYSTECTOMY    . CORONARY ANGIOPLASTY WITH STENT PLACEMENT  2007   two 2.5  x 13 mm Cypher stents to the RCA  . dilatera cataracts removed    . right knee surgary     HPI:  61 y.o. male admitted from SNF with N/V, hypoxia.  Hx CAD, with ICM, diastolic CHF, W2BJ, peripheral neuropathy, DVT with recent admission 03/28/16 for acute ischemic infarct within left paramedian pons. MRI brain also revealed old bilateral thalamic infarcts, bilateral basal ganglia lacunar infarcts, multiple old pontine and cerebellar infarcts.  CIR admission 2/6-2/28/18; D/C to SNF having made minimal progress with significant cognitive deficits, decreased visual acuity, ataxia, right inattention with sensory deficits .  MBS 2/28 mild oral deficits, delayed swallow initiation, no penetration/aspiration per study, but at bedside pt with frequent coughing and vocal changes associated with meals, so pt was D/Cd on dys2, nectar-thick liquids.  RN reports continued swallowing difficulty with this admission - pt on liquid diet.   Assessment / Plan /  Recommendation Clinical Impression  Pt with hx of dysphagia, cognitive deficits presents with persisting functional difficulty with swallowing.  He is intermittently attentive; answers questions about biographical information; poor initiation.  Demonstrates adequate oral attention to POs, + s/s of aspiration with thin liquids; improved toleration of nectars and pureed solids with no overt difficulty.  Recommend dysphagia 1 diet, nectar thick liquids for now; meds whole in puree.  SLP will follow for diet progression and attempt to reach SNF SLP re: status at facility.  SLP Visit Diagnosis: Dysphagia, unspecified (R13.10)    Aspiration Risk  Mild aspiration risk    Diet Recommendation   dys 1, nectar  Medication Administration: Whole meds with puree    Other  Recommendations Oral Care Recommendations: Oral care BID Other Recommendations: Order thickener from pharmacy   Follow up Recommendations Skilled Nursing facility      Frequency and Duration min 2x/week  1 week       Prognosis Prognosis for Safe Diet Advancement: Guarded Barriers to Reach Goals: Cognitive deficits;Motivation;Time post onset;Severity of deficits      Swallow Study   General Date of Onset: 05/10/16 HPI: 61 y.o. male admitted from SNF with N/V, hypoxia.  Hx CAD, with ICM, diastolic CHF, S2GB, peripheral neuropathy, DVT with recent admission 03/28/16 for acute ischemic infarct within left paramedian pons. MRI brain also revealed old bilateral thalamic infarcts, bilateral basal ganglia lacunar infarcts, multiple old pontine and cerebellar infarcts.  CIR admission 2/6-2/28/18; D/C to SNF having made minimal  progress with significant cognitive deficits, decreased visual acuity, ataxia, right inattention with sensory deficits .  MBS 2/28 mild oral deficits, delayed swallow initiation, no penetration/aspiration per study, but at bedside pt with frequent coughing and vocal changes associated with meals, so pt was D/Cd on dys2,  nectar-thick liquids.  Type of Study: Bedside Swallow Evaluation Previous Swallow Assessment: BSE recommend dysphagia 2 with nectar thick  Diet Prior to this Study: Thin liquids Temperature Spikes Noted: No Respiratory Status: Room air History of Recent Intubation: No Behavior/Cognition: Lethargic/Drowsy;Distractible;Requires cueing Oral Cavity Assessment: Within Functional Limits Oral Care Completed by SLP: Recent completion by staff Oral Cavity - Dentition: Poor condition;Missing dentition Self-Feeding Abilities: Needs assist Patient Positioning: Upright in bed Baseline Vocal Quality: Normal Volitional Cough: Strong Volitional Swallow: Able to elicit    Oral/Motor/Sensory Function Overall Oral Motor/Sensory Function: Within functional limits   Ice Chips Ice chips: Not tested   Thin Liquid Thin Liquid: Impaired Presentation: Cup;Straw Pharyngeal  Phase Impairments: Cough - Immediate    Nectar Thick Nectar Thick Liquid: Within functional limits Presentation: Cup   Honey Thick Honey Thick Liquid: Not tested   Puree Puree: Within functional limits   Solid   GO   Solid: Not tested    Functional Assessment Tool Used: clinical judgment Functional Limitations: Swallowing Swallow Current Status (F2924): At least 40 percent but less than 60 percent impaired, limited or restricted Swallow Goal Status [id number]): At least 20 percent but less than 40 percent impaired, limited or restricted   [name] 05/11/2016,10:28 AM   Estill Bamberg L. Tivis Ringer, [place] CCC/SLP Pager [phone]

## 2016-05-12 DIAGNOSIS — E1165 Type 2 diabetes mellitus with hyperglycemia: Secondary | ICD-10-CM | POA: Diagnosis not present

## 2016-05-12 DIAGNOSIS — R509 Fever, unspecified: Secondary | ICD-10-CM | POA: Diagnosis not present

## 2016-05-12 DIAGNOSIS — Z91041 Radiographic dye allergy status: Secondary | ICD-10-CM | POA: Diagnosis not present

## 2016-05-12 DIAGNOSIS — L8992 Pressure ulcer of unspecified site, stage 2: Secondary | ICD-10-CM | POA: Diagnosis not present

## 2016-05-12 DIAGNOSIS — I5032 Chronic diastolic (congestive) heart failure: Secondary | ICD-10-CM | POA: Diagnosis not present

## 2016-05-12 DIAGNOSIS — M1990 Unspecified osteoarthritis, unspecified site: Secondary | ICD-10-CM | POA: Diagnosis not present

## 2016-05-12 DIAGNOSIS — E1151 Type 2 diabetes mellitus with diabetic peripheral angiopathy without gangrene: Secondary | ICD-10-CM | POA: Diagnosis not present

## 2016-05-12 DIAGNOSIS — L97429 Non-pressure chronic ulcer of left heel and midfoot with unspecified severity: Secondary | ICD-10-CM | POA: Diagnosis not present

## 2016-05-12 DIAGNOSIS — L03116 Cellulitis of left lower limb: Secondary | ICD-10-CM | POA: Diagnosis not present

## 2016-05-12 DIAGNOSIS — L97419 Non-pressure chronic ulcer of right heel and midfoot with unspecified severity: Secondary | ICD-10-CM | POA: Diagnosis not present

## 2016-05-12 DIAGNOSIS — E785 Hyperlipidemia, unspecified: Secondary | ICD-10-CM | POA: Diagnosis not present

## 2016-05-12 DIAGNOSIS — G629 Polyneuropathy, unspecified: Secondary | ICD-10-CM | POA: Diagnosis not present

## 2016-05-12 DIAGNOSIS — I70234 Atherosclerosis of native arteries of right leg with ulceration of heel and midfoot: Secondary | ICD-10-CM | POA: Diagnosis not present

## 2016-05-12 DIAGNOSIS — I519 Heart disease, unspecified: Secondary | ICD-10-CM | POA: Diagnosis not present

## 2016-05-12 DIAGNOSIS — I7025 Atherosclerosis of native arteries of other extremities with ulceration: Secondary | ICD-10-CM | POA: Diagnosis not present

## 2016-05-12 DIAGNOSIS — I129 Hypertensive chronic kidney disease with stage 1 through stage 4 chronic kidney disease, or unspecified chronic kidney disease: Secondary | ICD-10-CM | POA: Diagnosis not present

## 2016-05-12 DIAGNOSIS — I5022 Chronic systolic (congestive) heart failure: Secondary | ICD-10-CM | POA: Diagnosis not present

## 2016-05-12 DIAGNOSIS — S91001A Unspecified open wound, right ankle, initial encounter: Secondary | ICD-10-CM | POA: Diagnosis not present

## 2016-05-12 DIAGNOSIS — I701 Atherosclerosis of renal artery: Secondary | ICD-10-CM | POA: Diagnosis not present

## 2016-05-12 DIAGNOSIS — S91302A Unspecified open wound, left foot, initial encounter: Secondary | ICD-10-CM | POA: Diagnosis not present

## 2016-05-12 DIAGNOSIS — E2740 Unspecified adrenocortical insufficiency: Secondary | ICD-10-CM | POA: Diagnosis not present

## 2016-05-12 DIAGNOSIS — L89153 Pressure ulcer of sacral region, stage 3: Secondary | ICD-10-CM | POA: Diagnosis not present

## 2016-05-12 DIAGNOSIS — I70233 Atherosclerosis of native arteries of right leg with ulceration of ankle: Secondary | ICD-10-CM | POA: Diagnosis not present

## 2016-05-12 DIAGNOSIS — L089 Local infection of the skin and subcutaneous tissue, unspecified: Secondary | ICD-10-CM | POA: Diagnosis not present

## 2016-05-12 DIAGNOSIS — G464 Cerebellar stroke syndrome: Secondary | ICD-10-CM | POA: Diagnosis not present

## 2016-05-12 DIAGNOSIS — I10 Essential (primary) hypertension: Secondary | ICD-10-CM | POA: Diagnosis not present

## 2016-05-12 DIAGNOSIS — L97319 Non-pressure chronic ulcer of right ankle with unspecified severity: Secondary | ICD-10-CM | POA: Diagnosis not present

## 2016-05-12 DIAGNOSIS — F329 Major depressive disorder, single episode, unspecified: Secondary | ICD-10-CM | POA: Diagnosis not present

## 2016-05-12 DIAGNOSIS — R0902 Hypoxemia: Secondary | ICD-10-CM | POA: Diagnosis not present

## 2016-05-12 DIAGNOSIS — Z794 Long term (current) use of insulin: Secondary | ICD-10-CM | POA: Diagnosis not present

## 2016-05-12 DIAGNOSIS — R339 Retention of urine, unspecified: Secondary | ICD-10-CM | POA: Diagnosis not present

## 2016-05-12 DIAGNOSIS — E7800 Pure hypercholesterolemia, unspecified: Secondary | ICD-10-CM | POA: Diagnosis not present

## 2016-05-12 DIAGNOSIS — R410 Disorientation, unspecified: Secondary | ICD-10-CM | POA: Diagnosis not present

## 2016-05-12 DIAGNOSIS — H5440 Blindness, one eye, unspecified eye: Secondary | ICD-10-CM | POA: Diagnosis not present

## 2016-05-12 DIAGNOSIS — Z8249 Family history of ischemic heart disease and other diseases of the circulatory system: Secondary | ICD-10-CM | POA: Diagnosis not present

## 2016-05-12 DIAGNOSIS — S91301A Unspecified open wound, right foot, initial encounter: Secondary | ICD-10-CM | POA: Diagnosis not present

## 2016-05-12 DIAGNOSIS — I25110 Atherosclerotic heart disease of native coronary artery with unstable angina pectoris: Secondary | ICD-10-CM | POA: Diagnosis not present

## 2016-05-12 DIAGNOSIS — M109 Gout, unspecified: Secondary | ICD-10-CM | POA: Diagnosis not present

## 2016-05-12 DIAGNOSIS — I70244 Atherosclerosis of native arteries of left leg with ulceration of heel and midfoot: Secondary | ICD-10-CM | POA: Diagnosis not present

## 2016-05-12 DIAGNOSIS — E1122 Type 2 diabetes mellitus with diabetic chronic kidney disease: Secondary | ICD-10-CM | POA: Diagnosis not present

## 2016-05-12 DIAGNOSIS — L98499 Non-pressure chronic ulcer of skin of other sites with unspecified severity: Secondary | ICD-10-CM | POA: Diagnosis not present

## 2016-05-12 DIAGNOSIS — E1142 Type 2 diabetes mellitus with diabetic polyneuropathy: Secondary | ICD-10-CM | POA: Diagnosis not present

## 2016-05-12 DIAGNOSIS — I63432 Cerebral infarction due to embolism of left posterior cerebral artery: Secondary | ICD-10-CM | POA: Diagnosis not present

## 2016-05-12 DIAGNOSIS — Z86718 Personal history of other venous thrombosis and embolism: Secondary | ICD-10-CM | POA: Diagnosis not present

## 2016-05-12 DIAGNOSIS — I999 Unspecified disorder of circulatory system: Secondary | ICD-10-CM | POA: Diagnosis not present

## 2016-05-12 DIAGNOSIS — M6281 Muscle weakness (generalized): Secondary | ICD-10-CM | POA: Diagnosis not present

## 2016-05-12 DIAGNOSIS — I825Y9 Chronic embolism and thrombosis of unspecified deep veins of unspecified proximal lower extremity: Secondary | ICD-10-CM | POA: Diagnosis not present

## 2016-05-12 DIAGNOSIS — I509 Heart failure, unspecified: Secondary | ICD-10-CM | POA: Diagnosis not present

## 2016-05-12 DIAGNOSIS — S81839A Puncture wound without foreign body, unspecified lower leg, initial encounter: Secondary | ICD-10-CM | POA: Diagnosis not present

## 2016-05-12 DIAGNOSIS — L89521 Pressure ulcer of left ankle, stage 1: Secondary | ICD-10-CM | POA: Diagnosis not present

## 2016-05-12 DIAGNOSIS — I252 Old myocardial infarction: Secondary | ICD-10-CM | POA: Diagnosis not present

## 2016-05-12 DIAGNOSIS — I70243 Atherosclerosis of native arteries of left leg with ulceration of ankle: Secondary | ICD-10-CM | POA: Diagnosis not present

## 2016-05-12 DIAGNOSIS — F419 Anxiety disorder, unspecified: Secondary | ICD-10-CM | POA: Diagnosis not present

## 2016-05-12 DIAGNOSIS — S31809A Unspecified open wound of unspecified buttock, initial encounter: Secondary | ICD-10-CM | POA: Diagnosis not present

## 2016-05-12 DIAGNOSIS — R488 Other symbolic dysfunctions: Secondary | ICD-10-CM | POA: Diagnosis not present

## 2016-05-12 DIAGNOSIS — L03115 Cellulitis of right lower limb: Secondary | ICD-10-CM | POA: Diagnosis not present

## 2016-05-12 DIAGNOSIS — R1312 Dysphagia, oropharyngeal phase: Secondary | ICD-10-CM | POA: Diagnosis not present

## 2016-05-12 DIAGNOSIS — I110 Hypertensive heart disease with heart failure: Secondary | ICD-10-CM | POA: Diagnosis not present

## 2016-05-12 DIAGNOSIS — N183 Chronic kidney disease, stage 3 (moderate): Secondary | ICD-10-CM | POA: Diagnosis not present

## 2016-05-12 DIAGNOSIS — L89620 Pressure ulcer of left heel, unstageable: Secondary | ICD-10-CM | POA: Diagnosis not present

## 2016-05-12 DIAGNOSIS — E1140 Type 2 diabetes mellitus with diabetic neuropathy, unspecified: Secondary | ICD-10-CM | POA: Diagnosis not present

## 2016-05-12 DIAGNOSIS — Z6834 Body mass index (BMI) 34.0-34.9, adult: Secondary | ICD-10-CM | POA: Diagnosis not present

## 2016-05-12 DIAGNOSIS — L89610 Pressure ulcer of right heel, unstageable: Secondary | ICD-10-CM | POA: Diagnosis not present

## 2016-05-12 DIAGNOSIS — J9601 Acute respiratory failure with hypoxia: Secondary | ICD-10-CM | POA: Diagnosis not present

## 2016-05-12 DIAGNOSIS — Z7901 Long term (current) use of anticoagulants: Secondary | ICD-10-CM | POA: Diagnosis not present

## 2016-05-12 DIAGNOSIS — E11621 Type 2 diabetes mellitus with foot ulcer: Secondary | ICD-10-CM | POA: Diagnosis not present

## 2016-05-12 DIAGNOSIS — N179 Acute kidney failure, unspecified: Secondary | ICD-10-CM | POA: Diagnosis not present

## 2016-05-12 DIAGNOSIS — E1159 Type 2 diabetes mellitus with other circulatory complications: Secondary | ICD-10-CM | POA: Diagnosis not present

## 2016-05-12 DIAGNOSIS — I150 Renovascular hypertension: Secondary | ICD-10-CM | POA: Diagnosis not present

## 2016-05-12 DIAGNOSIS — I160 Hypertensive urgency: Secondary | ICD-10-CM | POA: Diagnosis not present

## 2016-05-12 DIAGNOSIS — F4320 Adjustment disorder, unspecified: Secondary | ICD-10-CM | POA: Diagnosis not present

## 2016-05-12 DIAGNOSIS — I739 Peripheral vascular disease, unspecified: Secondary | ICD-10-CM | POA: Diagnosis not present

## 2016-05-12 DIAGNOSIS — I69351 Hemiplegia and hemiparesis following cerebral infarction affecting right dominant side: Secondary | ICD-10-CM | POA: Diagnosis not present

## 2016-05-12 DIAGNOSIS — I639 Cerebral infarction, unspecified: Secondary | ICD-10-CM | POA: Diagnosis not present

## 2016-05-12 DIAGNOSIS — Z79899 Other long term (current) drug therapy: Secondary | ICD-10-CM | POA: Diagnosis not present

## 2016-05-12 DIAGNOSIS — R748 Abnormal levels of other serum enzymes: Secondary | ICD-10-CM | POA: Diagnosis not present

## 2016-05-12 DIAGNOSIS — I2510 Atherosclerotic heart disease of native coronary artery without angina pectoris: Secondary | ICD-10-CM | POA: Diagnosis not present

## 2016-05-12 LAB — CBC
HEMATOCRIT: 38.4 % — AB (ref 39.0–52.0)
Hemoglobin: 12.1 g/dL — ABNORMAL LOW (ref 13.0–17.0)
MCH: 28.9 pg (ref 26.0–34.0)
MCHC: 31.5 g/dL (ref 30.0–36.0)
MCV: 91.6 fL (ref 78.0–100.0)
PLATELETS: 271 10*3/uL (ref 150–400)
RBC: 4.19 MIL/uL — ABNORMAL LOW (ref 4.22–5.81)
RDW: 13.2 % (ref 11.5–15.5)
WBC: 13.5 10*3/uL — AB (ref 4.0–10.5)

## 2016-05-12 LAB — BASIC METABOLIC PANEL
ANION GAP: 7 (ref 5–15)
BUN: 57 mg/dL — ABNORMAL HIGH (ref 6–20)
BUN: 57 mg/dL — AB (ref 4–21)
CALCIUM: 8.4 mg/dL — AB (ref 8.9–10.3)
CO2: 27 mmol/L (ref 22–32)
Chloride: 109 mmol/L (ref 101–111)
Creatinine, Ser: 1.98 mg/dL — ABNORMAL HIGH (ref 0.61–1.24)
Creatinine: 2 mg/dL — AB (ref 0.6–1.3)
GFR calc Af Amer: 41 mL/min — ABNORMAL LOW (ref >60)
GFR, EST NON AFRICAN AMERICAN: 35 mL/min — AB (ref >60)
GLUCOSE: 240 mg/dL
Glucose, Bld: 240 mg/dL — ABNORMAL HIGH (ref 65–99)
Potassium: 3.8 mmol/L (ref 3.5–5.1)
Sodium: 143 mmol/L (ref 135–145)
Sodium: 143 mmol/L (ref 137–147)

## 2016-05-12 LAB — GLUCOSE, CAPILLARY: Glucose-Capillary: 142 mg/dL — ABNORMAL HIGH (ref 65–99)

## 2016-05-12 LAB — CBC AND DIFFERENTIAL: WBC: 13.5 10^3/mL

## 2016-05-12 MED ORDER — NITROGLYCERIN 0.4 MG SL SUBL: 0.4000 mg | SUBLINGUAL_TABLET | SUBLINGUAL | 12 refills | Status: DC | PRN

## 2016-05-12 MED ORDER — INSULIN ASPART 100 UNIT/ML ~~LOC~~ SOLN
0.0000 [IU] | Freq: Every day | SUBCUTANEOUS | Status: DC
Start: 2016-05-12 — End: 2016-05-12

## 2016-05-12 MED ORDER — INSULIN ASPART 100 UNIT/ML ~~LOC~~ SOLN
0.0000 [IU] | Freq: Three times a day (TID) | SUBCUTANEOUS | Status: DC
  Administered 2016-05-12: 1 [IU] via SUBCUTANEOUS

## 2016-05-12 MED ORDER — ALBUTEROL SULFATE (2.5 MG/3ML) 0.083% IN NEBU: 2.5000 mg | INHALATION_SOLUTION | RESPIRATORY_TRACT | 12 refills | Status: DC | PRN

## 2016-05-12 NOTE — Progress Notes (Addendum)
Pt has orders to be discharged back to [facility]. Report called but no answer. Telemetry box removed. IV removed and site in good condition. Pt stable and waiting for transportation.   Maurene Capes RN

## 2016-05-12 NOTE — Progress Notes (Signed)
SUBJECTIVE: The patient is doing a little better. Plans for discharge noted.  Denies CP or SOB.  Marland Kitchen amLODipine  10 mg Oral Daily  . atorvastatin  40 mg Oral q1800  . bethanechol  10 mg Oral TID  . carvedilol  25 mg Oral BID WC  . cloNIDine  0.3 mg Oral TID  . feeding supplement (GLUCERNA SHAKE)  237 mL Oral TID AC & HS  . FLUoxetine  10 mg Oral Daily  . insulin aspart  0-5 Units Subcutaneous QHS  . insulin aspart  0-9 Units Subcutaneous TID WC  . insulin glargine  60 Units Subcutaneous QHS  . isosorbide-hydrALAZINE  2 tablet Oral TID  . mirtazapine  7.5 mg Oral QHS  . polyethylene glycol  17 g Oral Daily  . rivaroxaban  20 mg Oral Q supper  . tamsulosin  0.4 mg Oral QPC supper  . zinc sulfate  220 mg Oral Daily     OBJECTIVE: Physical Exam: Vitals:   05/11/16 1200 05/11/16 1655 05/11/16 1917 05/12/16 0639  BP: 124/90 (!) 142/60 129/81 133/68  Pulse: 98 70 77 72  Resp: (!) 22 19 18 18   Temp: 98.4 F (36.9 C)  98.3 F (36.8 C) 97.7 F (36.5 C)  TempSrc: Oral  Oral Oral  SpO2: 97% 97% 97% 97%  Weight:    218 lb 1.6 oz (98.9 kg)  Height:        Intake/Output Summary (Last 24 hours) at 05/12/16 1342 Last data filed at 05/12/16 0900  Gross per 24 hour  Intake              872 ml  Output             1400 ml  Net             -528 ml    Telemetry reveals sinus rhythm  GEN- The patient is chronically appearing, sleeping but rouses Head- normocephalic, atraumatic Eyes-  Sclera clear, conjunctiva pink Ears- hearing intact Oropharynx- clear Neck- supple,   Lungs- decreased BS at bases, normal work of breathing Heart- Regular rate and rhythm  GI- soft, NT, ND, + BS Extremities- no clubbing, cyanosis, or edema Skin- no rash or lesion Psych- sleeping but rouses  LABS: Basic Metabolic Panel:  Recent Labs  05/10/16 1027 05/12/16 0446  NA 141 143  K 3.8 3.8  CL 104 109  CO2 27 27  GLUCOSE 148* 240*  BUN 65* 57*  CREATININE 2.05* 1.98*  CALCIUM 8.6* 8.4*    Liver Function Tests:  Recent Labs  05/10/16 1027  AST 131*  ALT 138*  ALKPHOS 70  BILITOT 0.5  PROT 8.1  ALBUMIN 2.7*    Recent Labs  05/10/16 1027  LIPASE 27   CBC:  Recent Labs  05/10/16 1027 05/12/16 0446  WBC 10.5 13.5*  NEUTROABS 7.4  --   HGB 12.6* 12.1*  HCT 39.1 38.4*  MCV 89.9 91.6  PLT 253 271   Recent Labs  05/10/16 1333 05/10/16 1815 05/10/16 2106  TROPONINI 0.08* 0.10* 0.08*   Echo is reviewed  ASSESSMENT AND PLAN:   1. Elevated troponin Very mild and not consistent with MI Echo reveals normal wall motion No symptoms of ischemic currently Would agree with Dr Debara Pickett that medical management is appropriate  2. Chronic diastolic dysfunction/ acute on chronic renal insufficiency repeat CBC/BMET on Monday Lasix has been placed on hold. Once creatinine back to baseline, if patient needs to be on diuresis, please lower the  dose to 40 mg daily.  Chlorthalidone and losartan on hold until the creatinine function is back to baseline  3. HTN As above  4. Anticoagulation -With history of DVT and then stroke in 03/2016 -Continue Xarelto. Monitor renal function and adjust as needed.   Thompson Grayer, MD 05/12/2016 1:42 PM

## 2016-05-12 NOTE — Discharge Summary (Addendum)
Physician Discharge Summary   Patient ID: [name] MRN: [id number] DOB/AGE: Sep 10, 1955 61 y.o.  Admit date: 05/10/2016 Discharge date: 05/12/2016  Primary Care Physician:  [name], MD  Discharge Diagnoses:    . Acute hypoxic respiratory failure  . Diabetes type 2, uncontrolled ([place]) . Hypertension . Elevated troponin . AKI (acute kidney injury) [facility])   Consults: Cardiology  Recommendations for Outpatient Follow-up:  1. Please repeat CBC/BMET on Monday 2. Per cardiology recommendations, Lasix has been placed on hold. Once creatinine back to baseline, if patient needs to be on diuresis, please lower the dose to 40 mg daily.  3. Currently chlorthalidone and losartan on hold until the creatinine function is back to baseline   DIET: Carb modified diet Dysphagia 1 diet, nectar thick liquids, meds whole in puree; assist with feeding    Allergies:   Allergies  Allergen Reactions  . Iohexol Hives  . Ivp Dye [Iodinated Diagnostic Agents] Hives     DISCHARGE MEDICATIONS: Current Discharge Medication List    START taking these medications   Details  albuterol (PROVENTIL) (2.5 MG/3ML) 0.083% nebulizer solution Take 3 mLs (2.5 mg total) by nebulization every 2 (two) hours as needed for shortness of breath. Qty: 75 mL, Refills: 12    nitroGLYCERIN (NITROSTAT) 0.4 MG SL tablet Place 1 tablet (0.4 mg total) under the tongue every 5 (five) minutes as needed for chest pain. Refills: 12      CONTINUE these medications which have NOT CHANGED   Details  acetaminophen (TYLENOL) 160 MG/5ML suspension Take 650 mg by mouth every 4 (four) hours as needed for mild pain. 20.3 ml ( 650 mg total)    amLODipine (NORVASC) 10 MG tablet Take 1 tablet (10 mg total) by mouth daily.    atorvastatin (LIPITOR) 40 MG tablet Take 1 tablet (40 mg total) by mouth daily at 6 PM. Qty: 30 tablet, Refills: 0    bethanechol (URECHOLINE) 10 MG tablet Take 1 tablet (10 mg total) by mouth  3 (three) times daily.    bisacodyl (DULCOLAX) 10 MG suppository Place 10 mg rectally as needed for moderate constipation.    carvedilol (COREG) 25 MG tablet Take 1 tablet (25 mg total) by mouth 2 (two) times daily with a meal. Qty: 60 tablet, Refills: 0    cloNIDine (CATAPRES) 0.3 MG tablet Take 1 tablet (0.3 mg total) by mouth 3 (three) times daily. Qty: 60 tablet, Refills: 11    FLUoxetine (PROZAC) 10 MG capsule Take 1 capsule (10 mg total) by mouth daily. Refills: 3    GLUCERNA (GLUCERNA) LIQD Take 237 mLs by mouth 4 (four) times daily.    hydrocerin (EUCERIN) CREA Apply 1 application topically 2 (two) times daily. Refills: 0    hydrocortisone (ANUSOL-HC) 2.5 % rectal cream Place rectally 2 (two) times daily as needed for hemorrhoids or itching. Qty: 30 g, Refills: 0    insulin aspart (NOVOLOG) 100 UNIT/ML injection Inject 8 Units into the skin 3 (three) times daily with meals. Qty: 10 mL, Refills: 11    Insulin Glargine (TOUJEO SOLOSTAR) 300 UNIT/ML SOPN Inject 60 Units into the skin at bedtime.    isosorbide-hydrALAZINE (BIDIL) 20-37.5 MG tablet Take 2 tablets by mouth 3 (three) times daily. Qty: 90 tablet, Refills: 0    lidocaine (XYLOCAINE) 2 % jelly Apply topically as needed (Use with in and out catheter). Qty: 30 mL, Refills: 0    Magnesium Hydroxide (MILK OF MAGNESIA PO) Take 30 mLs by mouth as needed (for constipation).  mirtazapine (REMERON) 7.5 MG tablet Take 1 tablet (7.5 mg total) by mouth at bedtime.    Multiple Vitamins-Minerals (MULTIVITAMIN PO) Take 1 tablet by mouth daily.    ondansetron (ZOFRAN) 4 MG tablet Take 4 mg by mouth every 6 (six) hours as needed for nausea or vomiting.    polyethylene glycol (MIRALAX / GLYCOLAX) packet Take 17 g by mouth daily. Qty: 14 each, Refills: 0    rivaroxaban (XARELTO) 20 MG TABS tablet Take 1 tablet (20 mg total) by mouth daily with supper. Qty: 30 tablet    senna-docusate (SENOKOT-S) 8.6-50 MG tablet Take 1  tablet by mouth daily. Qty: 30 tablet, Refills: 0    Sodium Phosphates (RA SALINE ENEMA) 19-7 GM/118ML ENEM Place 1 each rectally as needed (for constipation).    tamsulosin (FLOMAX) 0.4 MG CAPS capsule Take 1 capsule (0.4 mg total) by mouth daily after supper. Qty: 30 capsule    zinc sulfate 220 (50 Zn) MG capsule Take 220 mg by mouth daily.      STOP taking these medications     chlorthalidone (HYGROTON) 25 MG tablet      clindamycin (CLEOCIN) 300 MG capsule      furosemide (LASIX) 80 MG tablet      losartan (COZAAR) 100 MG tablet      potassium chloride 20 MEQ/15ML (10%) SOLN      insulin aspart protamine- aspart (NOVOLOG MIX 70/30) (70-30) 100 UNIT/ML injection      insulin aspart protamine- aspart (NOVOLOG MIX 70/30) (70-30) 100 UNIT/ML injection          Brief H and P: For complete details please refer to admission H and P, but in brief Patient is a 61 year old male with history of adrenal insufficiency, blind left eye, CHF, diabetes, hypertension, CAD, peripheral neuropathy, sent from skilled nursing facility for vomiting. Patient was not able to provide any history. Patient was found to be hypoxic, serial troponin was slightly positive and patient was admitted for further workup.   Hospital Course:  Acute hypoxic respiratory failure: Unclear etiology possibly due to aspiration from multiple episodes of vomiting - Patient is already on xarelto hence PE less likely - Mild troponin elevation possibly due to hypoxia, denied any chest pain -  BNP 53 - Currently respiratory failure resolved, O2 sats 97-100% on room air -CT chest on 3/15 showed no acute findings, no evidence of pneumonia or aspiration - 2-D echo showed EF of 34%, grade 1 diastolic dysfunction    Mild troponin anemia likely due to hypoxia, history of chronic diastolic CHF - EKG with mild abnormalities in V2 V3  -  2-D echo showed EF of 74%, grade 1 diastolic dysfunction  - Cardiology was consulted  due to mildly elevated troponins, did not recommend stress testing or cardiac catheterization, recommended medical therapy. - Per cardiology, lungs clear, JVP flat, no signs of volume overload, recommended hold Lasix, patient was placed on IV fluids hydration as he was clinically dry - Cardiology recommended holding Lasix until creatinine is back to baseline, if patient needs any diureses in future, lower the dose to 40mg  or 20mg  daily  - Please follow BMET on Monday  Hx of VTE Well's score PE 3.0, low risk; <4 is unlikely Pt already on xarelto, will continue    Pressure ulcer stage I-II Skin breakdown-sacrum, buttocks, Wound care consult was obtained, recommended silicone foam dressing for shallow wounds, change twice weekly and when necessary if soiled on the sacrum and buttocks   Acute kidney injury  on chronic kidney disease stage II -III - Cr at baseline, 1.68, creatinine 2.0 at the time of admission - Lasix was held, patient was given gentle hydration. Creatinine improved 1.9 at the time of discharge.  -Continue to hold Lasix, chlorthalidone, ACE inhibitor until creatinine function has normalized   Nausea and vomiting  - Resolved, abdominal x-rays negative for any SBO or ileus - Patient underwent SLP evaluation, speech and repeat recommended dysphagia 1 diet with nectar thick liquids  Depression Cont prozac, remeron  Diabetes mellitus   continue Glargine 60u qhs And sliding scale insulin    Chronic constipation Daily MiraLAX  Hypertension - Continue Norvasc, clonidine, BiDil, Coreg  - Hold losartan  Urinary retention Cont Urecholine, flomax  Hyperlipidemia Continue statin  Day of Discharge BP 133/68 (BP Location: Right Arm)   Pulse 72   Temp 97.7 F (36.5 C) (Oral)   Resp 18   Ht 5\' 9"  (1.753 m)   Wt 98.9 kg (218 lb 1.6 oz)   SpO2 97%   BMI 32.21 kg/m   Physical Exam: General: Alert and awake, not in any acute distress. HEENT: anicteric  sclera, pupils reactive to light and accommodation CVS: S1-S2 clear no murmur rubs or gallops Chest: clear to auscultation bilaterally, no wheezing rales or rhonchi Abdomen: soft nontender, nondistended, normal bowel sounds Extremities: no cyanosis, clubbing or edema noted bilaterally Neuro: Cranial nerves II-XII intact, no focal neurological deficits   The results of significant diagnostics from this hospitalization (including imaging, microbiology, ancillary and laboratory) are listed below for reference.    LAB RESULTS: Basic Metabolic Panel:  Recent Labs Lab 05/10/16 1027 05/12/16 0446  NA 141 143  K 3.8 3.8  CL 104 109  CO2 27 27  GLUCOSE 148* 240*  BUN 65* 57*  CREATININE 2.05* 1.98*  CALCIUM 8.6* 8.4*   Liver Function Tests:  Recent Labs Lab 05/10/16 1027  AST 131*  ALT 138*  ALKPHOS 70  BILITOT 0.5  PROT 8.1  ALBUMIN 2.7*    Recent Labs Lab 05/10/16 1027  LIPASE 27   No results for input(s): AMMONIA in the last 168 hours. CBC:  Recent Labs Lab 05/10/16 1027 05/12/16 0446  WBC 10.5 13.5*  NEUTROABS 7.4  --   HGB 12.6* 12.1*  HCT 39.1 38.4*  MCV 89.9 91.6  PLT 253 271   Cardiac Enzymes:  Recent Labs Lab 05/10/16 1815 05/10/16 2106  TROPONINI 0.10* 0.08*   BNP: Invalid input(s): POCBNP CBG:  Recent Labs Lab 05/10/16 1015 05/10/16 2056  GLUCAP 134* 239*    Significant Diagnostic Studies:  Dg Chest 2 View  Result Date: 05/10/2016 CLINICAL DATA:  Weakness. EXAM: CHEST  2 VIEW COMPARISON:  03/29/2016. FINDINGS: Cardiomegaly with normal pulmonary vascularity. Mild interstitial prominence. Mild CHF cannot be excluded P Low lung volumes. No pleural effusion or pneumothorax. IMPRESSION: Cardiomegaly with mild interstitial prominence. Mild CHF cannot be excluded. Low lung volumes. Electronically Signed   By: Marcello Moores  Register   On: 05/10/2016 12:16   Ct Chest Wo Contrast  Result Date: 05/10/2016 CLINICAL DATA:  Vomiting.  Mental status  changes. EXAM: CT CHEST WITHOUT CONTRAST TECHNIQUE: Multidetector CT imaging of the chest was performed following the standard protocol without IV contrast. COMPARISON:  Chest radiography same day and multiple previous. FINDINGS: Cardiovascular: Mild cardiomegaly. No pericardial effusion. Aortic atherosclerosis without aneurysm. Extensive coronary artery calcification. Mediastinum/Nodes: No mediastinal mass or lymphadenopathy. Lungs/Pleura: No pleural fluid. Poor inspiration. Allowing for that, the lungs are clear. No evidence of edema, consolidation  or collapse. No evidence of aspiration. Upper Abdomen: Previous cholecystectomy.  No acute finding. Musculoskeletal: Chronic degenerative changes of the thoracic spine. IMPRESSION: No acute finding by CT. Cardiomegaly. Coronary artery calcification. Aortic atherosclerosis. No evidence of pneumonia or aspiration. Electronically Signed   By: Nelson Chimes M.D.   On: 05/10/2016 16:17   Dg Abd 2 Views  Result Date: 05/10/2016 CLINICAL DATA:  Nausea and vomiting EXAM: ABDOMEN - 2 VIEW COMPARISON:  None. FINDINGS: Scattered large and small bowel gas is noted. No acute mass is noted. Some scattered densities are noted over both kidneys likely related to vascular calcifications. Mild degenerative changes of the lumbar spine are seen. No free air is noted. IMPRESSION: No acute abnormality noted. Electronically Signed   By: Inez Catalina M.D.   On: 05/10/2016 16:12    2D ECHO: Study Conclusions  - Left ventricle: The cavity size was normal. Wall thickness was   increased in a pattern of severe LVH. The estimated ejection   fraction was 55%. Abnormal global longitudinal strain, -13.5%.   Wall motion was normal; there were no regional wall motion   abnormalities. Doppler parameters are consistent with abnormal   left ventricular relaxation (grade 1 diastolic dysfunction). - Aortic valve: There was no stenosis. - Mitral valve: There was trivial regurgitation. - Left  atrium: The atrium was mildly dilated. - Right ventricle: The cavity size was normal. Systolic function   was normal. - Pulmonary arteries: No complete TR doppler jet so unable to   estimate PA systolic pressure. - Systemic veins: IVC was not visualized.   Disposition and Follow-up: Discharge Instructions    Diet Carb Modified    Complete by:  As directed    Increase activity slowly    Complete by:  As directed        DISPOSITION: SNF   [place], MD. Schedule an appointment as soon as possible for a visit in 2 week(s).   Specialty:  Family Medicine Contact information: [place] [address] [phone]        Pixie Casino, MD. Schedule an appointment as soon as possible for a visit in 2 week(s).   Specialty:  Cardiology Contact information: [place] [address] [phone]            Time spent on Discharge: 27 mins   Signed:   Margrete Delude M.D. [facility] 05/12/2016, 10:41 AM Pager: [phone]  CDI query addendum   Diabetes mellitus uncontrolled with hyperglycemia  -Hgb A1c 11.0 on 03/29/2016    Taraoluwa Thakur M.D. [facility] 05/29/2016, 8:54 PM  Pager: [phone]

## 2016-05-12 NOTE — Clinical Social Work Placement (Signed)
   CLINICAL SOCIAL WORK PLACEMENT  NOTE  Date:  05/12/2016  Patient Details  Name: [name] MRN: [id number] Date of Birth: April 30, 1955  Clinical Social Work is seeking post-discharge placement for this patient at the [place] level of care (*CSW will initial, date and re-position this form in  chart as items are completed):  Yes   Patient/family provided with [place] Work Department's list of facilities offering this level of care within the geographic area requested by the patient (or if unable, by the patient's family).  Yes   Patient/family informed of their freedom to choose among providers that offer the needed level of care, that participate in Medicare, Medicaid or managed care program needed by the patient, have an available bed and are willing to accept the patient.  Yes   Patient/family informed of [facility]'s ownership interest in [facility] and [facility], as well as of the fact that they are under no obligation to receive care at these facilities.  PASRR submitted to EDS on       PASRR number received on       Existing PASRR number confirmed on 05/11/16     FL2 transmitted to all facilities in geographic area requested by pt/family on 05/11/16     FL2 transmitted to all facilities within larger geographic area on       Patient informed that his/her managed care company has contracts with or will negotiate with certain facilities, including the following:        Yes   Patient/family informed of bed offers received.  Patient chooses bed at  [facility])     Physician recommends and patient chooses bed at      Patient to be transferred to  [facility]) on  .  Patient to be transferred to facility by  [facility])     Patient family notified on 05/12/16 of transfer.  Name of family member notified:        PHYSICIAN       Additional Comment:    _______________________________________________ Serafina Mitchell,  [place] 05/12/2016, 12:42 PM

## 2016-05-12 NOTE — Evaluation (Signed)
Physical Therapy Evaluation Patient Details Name: [name] MRN: [id number] DOB: [birth year] Today's Date: 05/12/2016   History of Present Illness  Patient is a 61 yo male admitted 05/10/16 with projectile vomiting and hypoxia.   PMH:  heel wound, blind Lt eye, recent CVA with Rt hemi, CHF, diabetes, herpes, hypertension, heart attack, peripheral neuropathy   Clinical Impression  Patient presents with problems listed below.  Will benefit from acute PT to maximize functional mobility prior to return to SNF.    Follow Up Recommendations SNF;Supervision/Assistance - 24 hour    Equipment Recommendations  None recommended by PT    Recommendations for Other Services       Precautions / Restrictions Precautions Precautions: Fall Restrictions Weight Bearing Restrictions: No      Mobility  Bed Mobility Overal bed mobility: Needs Assistance Bed Mobility: Rolling;Sidelying to Sit Rolling: Max assist Sidelying to sit: Total assist;+2 for physical assistance       General bed mobility comments: Verbal cues for rolling, using rail.  Patient able to reach for rail, but unable to assist with rolling, requiring max assist.  Attempted to move to sitting.  Patient unable to initiate movement of LE's toward EOB, or to use UE's to raise trunk to sitting.    Transfers                 General transfer comment: NT  Ambulation/Gait                Stairs            Wheelchair Mobility    Modified Rankin (Stroke Patients Only)       Balance                                             Pertinent Vitals/Pain Pain Assessment: Faces Faces Pain Scale: Hurts even more Pain Location: LE's with movement Pain Descriptors / Indicators: Grimacing;Moaning Pain Intervention(s): Limited activity within patient's tolerance;Monitored during session;Repositioned    Home Living Family/patient expects to be discharged to:: Skilled nursing facility Living  Arrangements: Alone Available Help at Discharge: Family;Available PRN/intermittently                  Prior Function Level of Independence: Needs assistance   Gait / Transfers Assistance Needed: When patient left CIR, he required max - total assist for bed mobility and standing.  Ambulated 30' holding rail in hallway and with max assist.  ADL's / Homemaking Assistance Needed: Max-total assist for ADL's        Hand Dominance   Dominant Hand: Right    Extremity/Trunk Assessment   Upper Extremity Assessment Upper Extremity Assessment: Generalized weakness;RUE deficits/detail;Difficult to assess due to impaired cognition RUE Deficits / Details: Decreased strength and coordination RUE Coordination: decreased gross motor;decreased fine motor    Lower Extremity Assessment Lower Extremity Assessment: RLE deficits/detail;LLE deficits/detail;Difficult to assess due to impaired cognition RLE Deficits / Details: Unable to lift RLE against gravity.  Patient yelling out with attempts to passively move LE. RLE: Unable to fully assess due to pain RLE Coordination: decreased gross motor LLE Deficits / Details: Unable to lift LLE against gravity.  Patient yelling out with attempts to passively move LE. LLE Coordination: decreased gross motor       Communication   Communication: Expressive difficulties  Cognition Arousal/Alertness: Lethargic Behavior During Therapy: Flat affect Overall Cognitive  Status: Impaired/Different from baseline (Cognitive issues following CVA) Area of Impairment: Orientation;Attention;Memory;Following commands;Awareness;Problem solving Orientation Level: Disoriented to;Place;Time;Situation Current Attention Level: Sustained Memory: Decreased short-term memory Following Commands: Follows one step commands inconsistently;Follows one step commands with increased time     Problem Solving: Slow processing;Decreased initiation;Difficulty sequencing;Requires verbal  cues;Requires tactile cues      General Comments General comments (skin integrity, edema, etc.): Wound on heel.  Prafo boots in place.    Exercises     Assessment/Plan    PT Assessment Patient needs continued PT services  PT Problem List Decreased strength;Decreased activity tolerance;Decreased balance;Decreased mobility;Decreased coordination;Decreased cognition;Decreased knowledge of use of DME;Decreased skin integrity;Pain       PT Treatment Interventions Functional mobility training;Therapeutic activities;Therapeutic exercise;Balance training;Patient/family education;Cognitive remediation    PT Goals (Current goals can be found in the Care Plan section)  Acute Rehab PT Goals Patient Stated Goal: None stated PT Goal Formulation: Patient unable to participate in goal setting Time For Goal Achievement: 05/26/16 Potential to Achieve Goals: Fair    Frequency Min 2X/week   Barriers to discharge Decreased caregiver support      Co-evaluation               End of Session   Activity Tolerance: Patient limited by pain;Patient limited by lethargy Patient left: in bed;with call bell/phone within reach;with bed alarm set   PT Visit Diagnosis: Muscle weakness (generalized) (M62.81);Difficulty in walking, not elsewhere classified (R26.2);Pain Pain - Right/Left:  (Bilateral) Pain - part of body: Leg         Time: 7425-9563 PT Time Calculation (min) (ACUTE ONLY): 10 min   Charges:   PT Evaluation $PT Eval Moderate Complexity: 1 Procedure     PT G Codes:         Despina Pole 05-18-16, 12:53 PM Carita Pian. Sanjuana Kava, [place] Pager [phone]

## 2016-05-12 NOTE — Clinical Social Work Note (Signed)
Clinical Social Worker facilitated patient discharge including contacting patient family and facility to confirm patient discharge plans.  Clinical information faxed to facility and family agreeable with plan.  CSW arranged ambulance transport via [facility] to [facility]. RN to call report [phone] prior to discharge.  Clinical Social Worker will sign off for now as social work intervention is no longer needed. Please consult [place] again if new need arises.  Chassity Ludke B. Joline Maxcy Clinical Social Work Dept Weekend Social Worker [phone] 12:40 PM

## 2016-05-14 ENCOUNTER — Encounter: Payer: Self-pay | Admitting: Internal Medicine

## 2016-05-14 ENCOUNTER — Non-Acute Institutional Stay (SKILLED_NURSING_FACILITY): Payer: Medicare Other | Admitting: Internal Medicine

## 2016-05-14 DIAGNOSIS — Z86718 Personal history of other venous thrombosis and embolism: Secondary | ICD-10-CM

## 2016-05-14 DIAGNOSIS — E1165 Type 2 diabetes mellitus with hyperglycemia: Secondary | ICD-10-CM

## 2016-05-14 DIAGNOSIS — Z794 Long term (current) use of insulin: Secondary | ICD-10-CM | POA: Diagnosis not present

## 2016-05-14 DIAGNOSIS — F329 Major depressive disorder, single episode, unspecified: Secondary | ICD-10-CM | POA: Diagnosis not present

## 2016-05-14 DIAGNOSIS — E1159 Type 2 diabetes mellitus with other circulatory complications: Secondary | ICD-10-CM

## 2016-05-14 DIAGNOSIS — N179 Acute kidney failure, unspecified: Secondary | ICD-10-CM | POA: Diagnosis not present

## 2016-05-14 DIAGNOSIS — I5032 Chronic diastolic (congestive) heart failure: Secondary | ICD-10-CM | POA: Diagnosis not present

## 2016-05-14 DIAGNOSIS — I10 Essential (primary) hypertension: Secondary | ICD-10-CM | POA: Diagnosis not present

## 2016-05-14 DIAGNOSIS — J9601 Acute respiratory failure with hypoxia: Secondary | ICD-10-CM | POA: Diagnosis not present

## 2016-05-14 DIAGNOSIS — IMO0002 Reserved for concepts with insufficient information to code with codable children: Secondary | ICD-10-CM

## 2016-05-14 DIAGNOSIS — N183 Chronic kidney disease, stage 3 unspecified: Secondary | ICD-10-CM

## 2016-05-14 DIAGNOSIS — E785 Hyperlipidemia, unspecified: Secondary | ICD-10-CM | POA: Diagnosis not present

## 2016-05-14 LAB — BASIC METABOLIC PANEL
BUN: 54 mg/dL — AB (ref 4–21)
Creatinine: 1.8 mg/dL — AB (ref 0.6–1.3)
GLUCOSE: 457 mg/dL
Potassium: 4.6 mmol/L (ref 3.4–5.3)
SODIUM: 149 mmol/L — AB (ref 137–147)

## 2016-05-14 NOTE — Progress Notes (Signed)
Pt discharged to [facility] on 05/12/2016. Medication reconciliation completed by PharmD with [facility]. Notified nurse Olu about medications included on discharge summary that were not in the SNFs chart including albuterol, nitroglycerin SL, and PRN acetaminophen. Pt also on Bidil 1 tab daily at SNF when discharge instructions said 2 tab daily. Nurse had the correct information in patient's discharge summary and will notify the MD.   Angela Burke, PharmD, BCPS Pharmacy Resident Pager: [phone]

## 2016-05-14 NOTE — Progress Notes (Signed)
: Provider:  [name] Coil, MD Location:  [place] Room Number: 469G Place of Service:  SNF (31)  PCP: [name], MD Patient Care Team: Maury Dus, MD as PCP - General (Family Medicine)  Extended Emergency Contact Information Primary Emergency Contact: Terry,Nicole Address: [address] [place]          [place] [address] [place] Phone: [phone] Mobile Phone: [phone] Relation: Daughter Secondary Emergency Contact: Lizabeth [place] Mobile Phone: [phone] Relation: Daughter     Allergies: Iohexol and Ivp dye [iodinated diagnostic agents]  Chief Complaint  Patient presents with  . New Admit To SNF    Admit to Facility    HPI: Patient is 61 y.o. male withwith past medical history of adrenal insufficiency, allergies, blind left eye, CHF, diabetes, herpes, hypertension, heart attack, peripheral neuropathy presents to the emergency room with chief complaint of vomiting. History gathered from patient who appears mildly sedated and nursing staff from his nursing home. Patient has been in his normal state of health for the last few days. No acute issues or symptoms. This morning he had multiple episodes of what was described as projectile vomiting with some green contents. On arrival to ED pt was found to be in acute hypoxic resp failure. Pt was admitted to [facility] from 3/15[facility]17 where he was evaluated for resp failure and a small rise in troponin. Work-up was neg and nausea and vomiting resolved. Hospital course was complicated by acute on VFI4-3 which resolved with IV hydration. Pt is admitted to SNF for generalized weakness. While at SNF pt will be followed for depression, tx with prozac and remeron, DM2, tx with insulin and HTN, tx with norvasc, clinidine, BiDil and coreg.  Past Medical History:  Diagnosis Date  . Adrenal insufficiency ([place])   . Allergy   . Arthritis   . Blind left eye   .  Carpal tunnel syndrome   . Cataract    bil removed  . CHF (congestive heart failure) ([place])   . Diabetes mellitus without complication ([place])   . ED (erectile dysfunction)   . Gout   . Herpes   . Hypercholesterolemia   . Hypertension   . MI (myocardial infarction)   . Nausea & vomiting   . Peripheral neuropathy ([place])   . Pneumonia   . PVD (peripheral vascular disease) ([place])     Past Surgical History:  Procedure Laterality Date  . CHOLECYSTECTOMY    . CORONARY ANGIOPLASTY WITH STENT PLACEMENT  2007   two 2.5  x 13 mm Cypher stents to the RCA  . dilatera cataracts removed    . right knee surgary      Allergies as of 05/14/2016      Reactions   Iohexol Hives   Ivp Dye [iodinated Diagnostic Agents] Hives      Medication List       Accurate as of 05/14/16  9:51 AM. Always use your most recent med list.          acetaminophen 160 MG/5ML suspension Commonly known as:  TYLENOL Take 650 mg by mouth every 4 (four) hours as needed for mild pain. 20.3 ml ( 650 mg total)   albuterol (2.5 MG/3ML) 0.083% nebulizer solution Commonly known as:  PROVENTIL Take 3 mLs (2.5 mg total) by nebulization every 2 (two) hours as needed for shortness of breath.   amLODipine 10 MG tablet Commonly known as:  NORVASC Take 1 tablet (10 mg  total) by mouth daily.   atorvastatin 40 MG tablet Commonly known as:  LIPITOR Take 1 tablet (40 mg total) by mouth daily at 6 PM.   bethanechol 10 MG tablet Commonly known as:  URECHOLINE Take 1 tablet (10 mg total) by mouth 3 (three) times daily.   bisacodyl 10 MG suppository Commonly known as:  DULCOLAX Place 10 mg rectally as needed for moderate constipation.   carvedilol 25 MG tablet Commonly known as:  COREG Take 1 tablet (25 mg total) by mouth 2 (two) times daily with a meal.   cloNIDine 0.3 MG tablet Commonly known as:  CATAPRES Take 1 tablet (0.3 mg total) by mouth 3 (three) times daily.   FLUoxetine 10 MG capsule Commonly known as:   PROZAC Take 1 capsule (10 mg total) by mouth daily.   GLUCERNA Liqd Take 237 mLs by mouth 4 (four) times daily.   hydrocerin Crea Apply 1 application topically 2 (two) times daily.   hydrocortisone 2.5 % rectal cream Commonly known as:  ANUSOL-HC Place rectally 2 (two) times daily as needed for hemorrhoids or itching.   insulin aspart 100 UNIT/ML injection Commonly known as:  novoLOG Inject 8 Units into the skin 3 (three) times daily with meals.   isosorbide-hydrALAZINE 20-37.5 MG tablet Commonly known as:  BIDIL Take 2 tablets by mouth 3 (three) times daily.   lidocaine 2 % jelly Commonly known as:  XYLOCAINE Apply topically as needed (Use with in and out catheter).   MILK OF MAGNESIA PO Take 30 mLs by mouth as needed (for constipation).   mirtazapine 7.5 MG tablet Commonly known as:  REMERON Take 1 tablet (7.5 mg total) by mouth at bedtime.   MULTIVITAMIN PO Take 1 tablet by mouth daily.   nitroGLYCERIN 0.4 MG SL tablet Commonly known as:  NITROSTAT Place 1 tablet (0.4 mg total) under the tongue every 5 (five) minutes as needed for chest pain.   ondansetron 4 MG tablet Commonly known as:  ZOFRAN Take 4 mg by mouth every 6 (six) hours as needed for nausea or vomiting.   polyethylene glycol packet Commonly known as:  MIRALAX / GLYCOLAX Take 17 g by mouth daily.   RA SALINE ENEMA 19-7 GM/118ML Enem Place 1 each rectally as needed (for constipation).   rivaroxaban 20 MG Tabs tablet Commonly known as:  XARELTO Take 1 tablet (20 mg total) by mouth daily with supper.   senna-docusate 8.6-50 MG tablet Commonly known as:  Senokot-S Take 1 tablet by mouth daily.   tamsulosin 0.4 MG Caps capsule Commonly known as:  FLOMAX Take 1 capsule (0.4 mg total) by mouth daily after supper.   TOUJEO SOLOSTAR 300 UNIT/ML Sopn Generic drug:  Insulin Glargine Inject 60 Units into the skin at bedtime.   zinc sulfate 220 (50 Zn) MG capsule Take 220 mg by mouth daily.        No orders of the defined types were placed in this encounter.   Immunization History  Administered Date(s) Administered  . PPD Test 04/25/2016    Social History  Substance Use Topics  . Smoking status: Never Smoker  . Smokeless tobacco: Never Used  . Alcohol use No    Family history is   Family History  Problem Relation Age of Onset  . Breast cancer Mother   . Hypertension Father     blood clot in leg  . Diabetes Maternal Aunt   . Heart attack Sister   . Colon cancer Neg Hx   .  Esophageal cancer Neg Hx   . Rectal cancer Neg Hx   . Stomach cancer Neg Hx       Review of Systems  UTO 2/2 dementia    Vitals:   05/14/16 0940  BP: (!) 162/74  Pulse: 78  Resp: 20  Temp: 97.3 F (36.3 C)    SpO2 Readings from Last 1 Encounters:  05/12/16 97%   Body mass index is 32.2 kg/m.     Physical Exam  GENERAL APPEARANCE: Alert, min conversant,  No acute distress.  SKIN: skin breakdown sacrum, buttocks dressed, B heel wounds, dressed HEAD: Normocephalic, atraumatic  EYES: Conjunctiva/lids clear. Pupils round, reactive. EOMs intact.  EARS: External exam WNL, canals clear. Hearing grossly normal.  NOSE: No deformity or discharge.  MOUTH/THROAT: Lips w/o lesions  RESPIRATORY: Breathing is even, unlabored. Lung sounds are clear   CARDIOVASCULAR: Heart RRR no murmurs, rubs or gallops. No peripheral edema.   GASTROINTESTINAL: Abdomen is soft, non-tender, not distended w/ normal bowel sounds. GENITOURINARY: Bladder non tender, not distended  MUSCULOSKELETAL: No abnormal joints or musculature NEUROLOGIC:  Cranial nerves 2-12 grossly intact. Moves all extremities  PSYCHIATRIC: Mood and affect with dementia, no behavioral issues  Patient Active Problem List   Diagnosis Date Noted  . Nausea and vomiting   . Hypoxia 05/10/2016  . Elevated troponin 05/10/2016  . AKI (acute kidney injury) ([place]) 05/10/2016  . Pressure injury of skin 05/10/2016  . CKD (chronic kidney  disease) stage 3, GFR 30-59 ml/min 04/28/2016  . Hypernatremia 04/28/2016  . Dysphagia due to recent stroke 04/28/2016  . Cystitis due to Pseudomonas 04/28/2016  . Enterococcus UTI 04/28/2016  . Hyperlipidemia associated with type 2 diabetes mellitus ([place]) 04/28/2016  . Depression 04/28/2016  . Urinary retention   . Accidental fall from chair   . FUO (fever of unknown origin)   . Cognitive and behavioral changes   . Cerebrovascular accident (CVA) due to embolism of left posterior cerebral artery ([place])   . Lethargy   . Acute on chronic renal insufficiency   . Low back pain   . Leukocytosis   . Labile blood glucose   . Hypoglycemia due to insulin   . UTI due to Klebsiella species   . Deep tissue injury   . Renovascular hypertension   . History of DVT (deep vein thrombosis)   . Coronary artery disease involving native coronary artery of native heart without angina pectoris   . Morbid obesity ([place])   . Stage 2 chronic kidney disease   . Hypokalemia   . Acute blood loss anemia   . Embolic stroke involving posterior cerebral artery ([place]) 04/03/2016  . Brainstem stroke ([place]) 04/01/2016  . Abnormality of gait and mobility   . Cerebral infarction due to thrombosis of other cerebral artery ([place])   . Right arm numbness   . Left sided numbness 03/29/2016  . TIA (transient ischemic attack) 03/29/2016  . Hypertensive urgency 03/29/2016  . Sepsis ([place]) 09/10/2013  . DVT (deep venous thrombosis) ([place]) 01/01/2013  . Rash and nonspecific skin eruption 12/29/2012  . Cellulitis and abscess of leg 12/29/2012  . Diabetes type 2, uncontrolled ([place]) 12/29/2012  . Hypertension   . Cardiomyopathy ([place])   . CHF (congestive heart failure) ([place])   . Peripheral neuropathy ([place])   . Special screening for malignant neoplasms, colon 12/09/2012      Labs reviewed: Basic Metabolic Panel:    Component Value Date/Time   NA 143 05/12/2016 0446   NA 143 05/12/2016  K 3.8 05/12/2016 0446   CL 109  05/12/2016 0446   CO2 27 05/12/2016 0446   GLUCOSE 240 (H) 05/12/2016 0446   BUN 57 (H) 05/12/2016 0446   BUN 57 (A) 05/12/2016   CREATININE 1.98 (H) 05/12/2016 0446   CALCIUM 8.4 (L) 05/12/2016 0446   PROT 8.1 05/10/2016 1027   ALBUMIN 2.7 (L) 05/10/2016 1027   AST 131 (H) 05/10/2016 1027   ALT 138 (H) 05/10/2016 1027   ALKPHOS 70 05/10/2016 1027   BILITOT 0.5 05/10/2016 1027   GFRNONAA 35 (L) 05/12/2016 0446   GFRAA 41 (L) 05/12/2016 0446     Recent Labs  04/01/16 0221  04/25/16 0357  05/10/16 05/10/16 1027 05/12/16 05/12/16 0446  NA 139  < > 146*  < > 141 141 143 143  K 3.5  < > 3.5  < > 3.8 3.8  --  3.8  CL 105  < > 109  --   --  104  --  109  CO2 26  < > 26  --   --  27  --  27  GLUCOSE 240*  < > 180*  --   --  148*  --  240*  BUN 18  < > 23*  < > 65* 65* 57* 57*  CREATININE 1.49*  < > 1.33*  < > 2.1* 2.05* 2.0* 1.98*  CALCIUM 9.0  < > 9.4  --   --  8.6*  --  8.4*  MG 1.9  --   --   --   --   --   --   --   < > = values in this interval not displayed. Liver Function Tests:  Recent Labs  03/28/16 2113 04/04/16 0516 05/10/16 1027  AST 24 17 131*  ALT 17 12* 138*  ALKPHOS 61 43 70  BILITOT 0.6 0.5 0.5  PROT 7.7 6.9 8.1  ALBUMIN 3.8 2.9* 2.7*    Recent Labs  05/10/16 1027  LIPASE 27   No results for input(s): AMMONIA in the last 8760 hours. CBC:  Recent Labs  04/18/16 0441  04/23/16 1151  05/10/16 05/10/16 1027 05/12/16 05/12/16 0446  WBC 8.4  < > 6.7  < > 10.5 10.5 13.5 13.5*  NEUTROABS 4.9  --  4.7  --   --  7.4  --   --   HGB 12.6*  --  13.7  < > 12.6* 12.6*  --  12.1*  HCT 40.0  --  43.7  < > 39* 39.1  --  38.4*  MCV 90.9  --  92.4  --   --  89.9  --  91.6  PLT 344  --  237  < > 253 253  --  271  < > = values in this interval not displayed. Lipid  Recent Labs  03/29/16 0531  CHOL 235*  HDL 36*  LDLCALC 167*  TRIG 160*    Cardiac Enzymes:  Recent Labs  05/10/16 1333 05/10/16 1815 05/10/16 2106  TROPONINI 0.08* 0.10* 0.08*    BNP:  Recent Labs  05/10/16 1027  BNP 53.8   No results found for: [facility] Lab Results  Component Value Date   HGBA1C 11.0 (H) 03/29/2016   No results found for: TSH No results found for: VITAMINB12 No results found for: FOLATE No results found for: IRON, TIBC, FERRITIN  Imaging and Procedures obtained prior to SNF admission: Dg Chest 2 View  Result Date: 05/10/2016 CLINICAL DATA:  Weakness. EXAM: CHEST  2  VIEW COMPARISON:  03/29/2016. FINDINGS: Cardiomegaly with normal pulmonary vascularity. Mild interstitial prominence. Mild CHF cannot be excluded P Low lung volumes. No pleural effusion or pneumothorax. IMPRESSION: Cardiomegaly with mild interstitial prominence. Mild CHF cannot be excluded. Low lung volumes. Electronically Signed   By: Marcello Moores  Register   On: 05/10/2016 12:16   Ct Chest Wo Contrast  Result Date: 05/10/2016 CLINICAL DATA:  Vomiting.  Mental status changes. EXAM: CT CHEST WITHOUT CONTRAST TECHNIQUE: Multidetector CT imaging of the chest was performed following the standard protocol without IV contrast. COMPARISON:  Chest radiography same day and multiple previous. FINDINGS: Cardiovascular: Mild cardiomegaly. No pericardial effusion. Aortic atherosclerosis without aneurysm. Extensive coronary artery calcification. Mediastinum/Nodes: No mediastinal mass or lymphadenopathy. Lungs/Pleura: No pleural fluid. Poor inspiration. Allowing for that, the lungs are clear. No evidence of edema, consolidation or collapse. No evidence of aspiration. Upper Abdomen: Previous cholecystectomy.  No acute finding. Musculoskeletal: Chronic degenerative changes of the thoracic spine. IMPRESSION: No acute finding by CT. Cardiomegaly. Coronary artery calcification. Aortic atherosclerosis. No evidence of pneumonia or aspiration. Electronically Signed   By: Nelson Chimes M.D.   On: 05/10/2016 16:17   Dg Abd 2 Views  Result Date: 05/10/2016 CLINICAL DATA:  Nausea and vomiting EXAM: ABDOMEN - 2  VIEW COMPARISON:  None. FINDINGS: Scattered large and small bowel gas is noted. No acute mass is noted. Some scattered densities are noted over both kidneys likely related to vascular calcifications. Mild degenerative changes of the lumbar spine are seen. No free air is noted. IMPRESSION: No acute abnormality noted. Electronically Signed   By: Inez Catalina M.D.   On: 05/10/2016 16:12     Not all labs, radiology exams or other studies done during hospitalization come through on my EPIC note; however they are reviewed by me.    Assessment and Plan  ACUTE HYPOXIC RESP FAILURE / CHRONIC dCHF- was said to be possible to aspiration from vomiting even though chest CT showed no evidence of aspiration or PNA; pt on xarelto, PE unlikely, EF 55% with grade 1 Diastolic dysfunction; resolved O2 sat 97-100%; pt not flid overloaded, in fact dry so lasix d/c per cards and if need to be restarted start at lower dose 20 mg or 40 mg daily SNF - admit for generalized weakness for OT/PT  HX VTE - PE unlikely SNf - cont xarelto  ACUTE ON CKD2-3 - Cr 2.0, baseline 1.68; lasix and ACE held SNF - cont holds; f/u BMP  DEPRESSION SNF - cont prozac 10 mg daily, remeron 7.5 mg daily  HTN SNF - hold lasix and losartan; cont BiDil 20-37.7 2 po TID, clonidine 0.3 mg TID, coreg 25 mg BID, and norvasc 10 mg daily  DM2 SNF - uncontrolled ; start with toujeo 60 u nightlu with 8 u with meals; ARB on hold, pt on statin  HLD SN - cont lipitor  40 mg daily   Time spent > 45 min;> 50% of time with patient was spent reviewing records, labs, tests and studies, counseling and developing plan of care  Webb Silversmith D. Sheppard Coil, MD

## 2016-05-15 ENCOUNTER — Encounter: Payer: Self-pay | Admitting: Internal Medicine

## 2016-05-15 ENCOUNTER — Non-Acute Institutional Stay (SKILLED_NURSING_FACILITY): Payer: Medicare Other | Admitting: Internal Medicine

## 2016-05-15 DIAGNOSIS — L97429 Non-pressure chronic ulcer of left heel and midfoot with unspecified severity: Secondary | ICD-10-CM | POA: Diagnosis not present

## 2016-05-15 DIAGNOSIS — L089 Local infection of the skin and subcutaneous tissue, unspecified: Secondary | ICD-10-CM | POA: Diagnosis not present

## 2016-05-15 DIAGNOSIS — L89521 Pressure ulcer of left ankle, stage 1: Secondary | ICD-10-CM | POA: Diagnosis not present

## 2016-05-15 DIAGNOSIS — L89153 Pressure ulcer of sacral region, stage 3: Secondary | ICD-10-CM | POA: Diagnosis not present

## 2016-05-15 DIAGNOSIS — E785 Hyperlipidemia, unspecified: Secondary | ICD-10-CM | POA: Insufficient documentation

## 2016-05-15 DIAGNOSIS — J9601 Acute respiratory failure with hypoxia: Secondary | ICD-10-CM | POA: Insufficient documentation

## 2016-05-15 DIAGNOSIS — L98499 Non-pressure chronic ulcer of skin of other sites with unspecified severity: Secondary | ICD-10-CM | POA: Diagnosis not present

## 2016-05-15 DIAGNOSIS — L89610 Pressure ulcer of right heel, unstageable: Secondary | ICD-10-CM | POA: Diagnosis not present

## 2016-05-15 DIAGNOSIS — L89620 Pressure ulcer of left heel, unstageable: Secondary | ICD-10-CM | POA: Diagnosis not present

## 2016-05-15 NOTE — Progress Notes (Signed)
Location:   [place] of Service:     [name] Coil, MD  Patient Care Team: Maury Dus, MD as PCP - General [facility])  Extended Emergency Contact Information Primary Emergency Contact: Terry,Nicole Address: [address]          [place] [address] [place] Phone: [phone] Mobile Phone: [phone] Relation: Daughter Secondary Emergency Contact: Lizabeth Leyden [place] Mobile Phone: [phone] Relation: Daughter    Allergies: Iohexol and Ivp dye [iodinated diagnostic agents]  Chief Complaint  Patient presents with  . Acute Visit    HPI: Patient is 61 y.o. male who the wound care nurse asked me to see. Pt has returned from hospital with the same infected heel that he went with. One heel is SDTI and the other is unstageable, reported to feel very mushy and has 2 open areas draining serosanguinous fluid with odor. Pt can not participate in hx 2/2 dementia  Past Medical History:  Diagnosis Date  . Adrenal insufficiency ([place])   . Allergy   . Arthritis   . Blind left eye   . Carpal tunnel syndrome   . Cataract    bil removed  . CHF (congestive heart failure) ([place])   . Diabetes mellitus without complication ([place])   . ED (erectile dysfunction)   . Gout   . Herpes   . Hypercholesterolemia   . Hypertension   . MI (myocardial infarction)   . Nausea & vomiting   . Peripheral neuropathy ([place])   . Pneumonia   . PVD (peripheral vascular disease) ([place])     Past Surgical History:  Procedure Laterality Date  . CHOLECYSTECTOMY    . CORONARY ANGIOPLASTY WITH STENT PLACEMENT  2007   two 2.5  x 13 mm Cypher stents to the RCA  . dilatera cataracts removed    . right knee surgary      Allergies as of 05/15/2016      Reactions   Iohexol Hives   Ivp Dye [iodinated Diagnostic Agents] Hives      Medication List       Accurate as of 05/15/16 11:59 PM. Always use your most recent med list.            acetaminophen 160 MG/5ML suspension Commonly known as:  TYLENOL Take 650 mg by mouth every 4 (four) hours as needed for mild pain. 20.3 ml ( 650 mg total)   albuterol (2.5 MG/3ML) 0.083% nebulizer solution Commonly known as:  PROVENTIL Take 3 mLs (2.5 mg total) by nebulization every 2 (two) hours as needed for shortness of breath.   amLODipine 10 MG tablet Commonly known as:  NORVASC Take 1 tablet (10 mg total) by mouth daily.   atorvastatin 40 MG tablet Commonly known as:  LIPITOR Take 1 tablet (40 mg total) by mouth daily at 6 PM.   bethanechol 10 MG tablet Commonly known as:  URECHOLINE Take 1 tablet (10 mg total) by mouth 3 (three) times daily.   bisacodyl 10 MG suppository Commonly known as:  DULCOLAX Place 10 mg rectally as needed for moderate constipation.   carvedilol 25 MG tablet Commonly known as:  COREG Take 1 tablet (25 mg total) by mouth 2 (two) times daily with a meal.   cloNIDine 0.3 MG tablet Commonly known as:  CATAPRES Take 1 tablet (0.3 mg total) by mouth 3 (three) times daily.   FLUoxetine 10 MG capsule Commonly known as:  PROZAC Take  1 capsule (10 mg total) by mouth daily.   GLUCERNA Liqd Take 237 mLs by mouth 4 (four) times daily.   hydrocerin Crea Apply 1 application topically 2 (two) times daily.   hydrocortisone 2.5 % rectal cream Commonly known as:  ANUSOL-HC Place rectally 2 (two) times daily as needed for hemorrhoids or itching.   insulin aspart 100 UNIT/ML injection Commonly known as:  novoLOG Inject 8 Units into the skin 3 (three) times daily with meals.   isosorbide-hydrALAZINE 20-37.5 MG tablet Commonly known as:  BIDIL Take 2 tablets by mouth 3 (three) times daily.   lidocaine 2 % jelly Commonly known as:  XYLOCAINE Apply topically as needed (Use with in and out catheter).   MILK OF MAGNESIA PO Take 30 mLs by mouth as needed (for constipation).   mirtazapine 7.5 MG tablet Commonly known as:  REMERON Take 1  tablet (7.5 mg total) by mouth at bedtime.   MULTIVITAMIN PO Take 1 tablet by mouth daily.   nitroGLYCERIN 0.4 MG SL tablet Commonly known as:  NITROSTAT Place 1 tablet (0.4 mg total) under the tongue every 5 (five) minutes as needed for chest pain.   ondansetron 4 MG tablet Commonly known as:  ZOFRAN Take 4 mg by mouth every 6 (six) hours as needed for nausea or vomiting.   polyethylene glycol packet Commonly known as:  MIRALAX / GLYCOLAX Take 17 g by mouth daily.   RA SALINE ENEMA 19-7 GM/118ML Enem Place 1 each rectally as needed (for constipation).   rivaroxaban 20 MG Tabs tablet Commonly known as:  XARELTO Take 1 tablet (20 mg total) by mouth daily with supper.   senna-docusate 8.6-50 MG tablet Commonly known as:  Senokot-S Take 1 tablet by mouth daily.   tamsulosin 0.4 MG Caps capsule Commonly known as:  FLOMAX Take 1 capsule (0.4 mg total) by mouth daily after supper.   TOUJEO SOLOSTAR 300 UNIT/ML Sopn Generic drug:  Insulin Glargine Inject 60 Units into the skin at bedtime.   zinc sulfate 220 (50 Zn) MG capsule Take 220 mg by mouth daily.       No orders of the defined types were placed in this encounter.   Immunization History  Administered Date(s) Administered  . PPD Test 04/25/2016    Social History  Substance Use Topics  . Smoking status: Never Smoker  . Smokeless tobacco: Never Used  . Alcohol use No    Review of Systems  UTO 2/2 dementia    Vitals:   05/15/16 0914  BP: 110/65  Pulse: 81  Resp: 18  Temp: 98.7 F (37.1 C)   Body mass index is 32.19 kg/m. Physical Exam  GENERAL APPEARANCE: Alert, non conversant, No acute distress  SKIN: dressing buttocks; dressing B heels HEENT: Unremarkable RESPIRATORY: Breathing is even, unlabored. Lung sounds are clear   CARDIOVASCULAR: Heart RRR no murmurs, rubs or gallops. No peripheral edema  GASTROINTESTINAL: Abdomen is soft, non-tender, not distended w/ normal bowel sounds.    GENITOURINARY: Bladder non tender, not distended  MUSCULOSKELETAL: No abnormal joints or musculature NEUROLOGIC: Cranial nerves 2-12 grossly intact. Moves all extremities PSYCHIATRIC: Mood and affect with dementia, no behavioral issues  Patient Active Problem List   Diagnosis Date Noted  . Acute respiratory failure with hypoxia ([place]) 05/15/2016  . HLD (hyperlipidemia) 05/15/2016  . Nausea and vomiting   . Hypoxia 05/10/2016  . Elevated troponin 05/10/2016  . AKI (acute kidney injury) ([place]) 05/10/2016  . Pressure injury of skin 05/10/2016  . CKD (chronic  kidney disease) stage 3, GFR 30-59 ml/min 04/28/2016  . Hypernatremia 04/28/2016  . Dysphagia due to recent stroke 04/28/2016  . Cystitis due to Pseudomonas 04/28/2016  . Enterococcus UTI 04/28/2016  . Hyperlipidemia associated with type 2 diabetes mellitus ([place]) 04/28/2016  . Depression 04/28/2016  . Urinary retention   . Accidental fall from chair   . FUO (fever of unknown origin)   . Cognitive and behavioral changes   . Cerebrovascular accident (CVA) due to embolism of left posterior cerebral artery ([place])   . Lethargy   . Acute on chronic renal insufficiency   . Low back pain   . Leukocytosis   . Labile blood glucose   . Hypoglycemia due to insulin   . UTI due to Klebsiella species   . Deep tissue injury   . Renovascular hypertension   . History of DVT (deep vein thrombosis)   . Coronary artery disease involving native coronary artery of native heart without angina pectoris   . Morbid obesity ([place])   . Stage 2 chronic kidney disease   . Hypokalemia   . Acute blood loss anemia   . Embolic stroke involving posterior cerebral artery ([place]) 04/03/2016  . Brainstem stroke ([place]) 04/01/2016  . Abnormality of gait and mobility   . Cerebral infarction due to thrombosis of other cerebral artery ([place])   . Right arm numbness   . Left sided numbness 03/29/2016  . TIA (transient ischemic attack) 03/29/2016  . Hypertensive urgency  03/29/2016  . Sepsis ([place]) 09/10/2013  . DVT (deep venous thrombosis) ([place]) 01/01/2013  . Rash and nonspecific skin eruption 12/29/2012  . Cellulitis and abscess of leg 12/29/2012  . Diabetes type 2, uncontrolled ([place]) 12/29/2012  . Hypertension   . Cardiomyopathy ([place])   . CHF (congestive heart failure) ([place])   . Peripheral neuropathy ([place])   . Special screening for malignant neoplasms, colon 12/09/2012    CMP     Component Value Date/Time   NA 143 05/12/2016 0446   NA 143 05/12/2016   K 3.8 05/12/2016 0446   CL 109 05/12/2016 0446   CO2 27 05/12/2016 0446   GLUCOSE 240 (H) 05/12/2016 0446   BUN 57 (H) 05/12/2016 0446   BUN 57 (A) 05/12/2016   CREATININE 1.98 (H) 05/12/2016 0446   CALCIUM 8.4 (L) 05/12/2016 0446   PROT 8.1 05/10/2016 1027   ALBUMIN 2.7 (L) 05/10/2016 1027   AST 131 (H) 05/10/2016 1027   ALT 138 (H) 05/10/2016 1027   ALKPHOS 70 05/10/2016 1027   BILITOT 0.5 05/10/2016 1027   GFRNONAA 35 (L) 05/12/2016 0446   GFRAA 41 (L) 05/12/2016 0446    Recent Labs  04/01/16 0221  04/25/16 0357  05/10/16 05/10/16 1027 05/12/16 05/12/16 0446  NA 139  < > 146*  < > 141 141 143 143  K 3.5  < > 3.5  < > 3.8 3.8  --  3.8  CL 105  < > 109  --   --  104  --  109  CO2 26  < > 26  --   --  27  --  27  GLUCOSE 240*  < > 180*  --   --  148*  --  240*  BUN 18  < > 23*  < > 65* 65* 57* 57*  CREATININE 1.49*  < > 1.33*  < > 2.1* 2.05* 2.0* 1.98*  CALCIUM 9.0  < > 9.4  --   --  8.6*  --  8.4*  MG 1.9  --   --   --   --   --   --   --   < > = values in this interval not displayed.  Recent Labs  03/28/16 2113 04/04/16 0516 05/10/16 1027  AST 24 17 131*  ALT 17 12* 138*  ALKPHOS 61 43 70  BILITOT 0.6 0.5 0.5  PROT 7.7 6.9 8.1  ALBUMIN 3.8 2.9* 2.7*    Recent Labs  04/18/16 0441  04/23/16 1151  05/10/16 05/10/16 1027 05/12/16 05/12/16 0446  WBC 8.4  < > 6.7  < > 10.5 10.5 13.5 13.5*  NEUTROABS 4.9  --  4.7  --   --  7.4  --   --   HGB 12.6*  --  13.7  < >  12.6* 12.6*  --  12.1*  HCT 40.0  --  43.7  < > 39* 39.1  --  38.4*  MCV 90.9  --  92.4  --   --  89.9  --  91.6  PLT 344  --  237  < > 253 253  --  271  < > = values in this interval not displayed.  Recent Labs  03/29/16 0531  CHOL 235*  LDLCALC 167*  TRIG 160*   No results found for: MICROALBUR No results found for: TSH Lab Results  Component Value Date   HGBA1C 11.0 (H) 03/29/2016   Lab Results  Component Value Date   CHOL 235 (H) 03/29/2016   HDL 36 (L) 03/29/2016   LDLCALC 167 (H) 03/29/2016   TRIG 160 (H) 03/29/2016   CHOLHDL 6.5 03/29/2016    Significant Diagnostic Results in last 30 days:  Dg Chest 2 View  Result Date: 05/10/2016 CLINICAL DATA:  Weakness. EXAM: CHEST  2 VIEW COMPARISON:  03/29/2016. FINDINGS: Cardiomegaly with normal pulmonary vascularity. Mild interstitial prominence. Mild CHF cannot be excluded P Low lung volumes. No pleural effusion or pneumothorax. IMPRESSION: Cardiomegaly with mild interstitial prominence. Mild CHF cannot be excluded. Low lung volumes. Electronically Signed   By: Marcello Moores  Register   On: 05/10/2016 12:16   Ct Chest Wo Contrast  Result Date: 05/10/2016 CLINICAL DATA:  Vomiting.  Mental status changes. EXAM: CT CHEST WITHOUT CONTRAST TECHNIQUE: Multidetector CT imaging of the chest was performed following the standard protocol without IV contrast. COMPARISON:  Chest radiography same day and multiple previous. FINDINGS: Cardiovascular: Mild cardiomegaly. No pericardial effusion. Aortic atherosclerosis without aneurysm. Extensive coronary artery calcification. Mediastinum/Nodes: No mediastinal mass or lymphadenopathy. Lungs/Pleura: No pleural fluid. Poor inspiration. Allowing for that, the lungs are clear. No evidence of edema, consolidation or collapse. No evidence of aspiration. Upper Abdomen: Previous cholecystectomy.  No acute finding. Musculoskeletal: Chronic degenerative changes of the thoracic spine. IMPRESSION: No acute finding by  CT. Cardiomegaly. Coronary artery calcification. Aortic atherosclerosis. No evidence of pneumonia or aspiration. Electronically Signed   By: Nelson Chimes M.D.   On: 05/10/2016 16:17   Dg Abd 2 Views  Result Date: 05/10/2016 CLINICAL DATA:  Nausea and vomiting EXAM: ABDOMEN - 2 VIEW COMPARISON:  None. FINDINGS: Scattered large and small bowel gas is noted. No acute mass is noted. Some scattered densities are noted over both kidneys likely related to vascular calcifications. Mild degenerative changes of the lumbar spine are seen. No free air is noted. IMPRESSION: No acute abnormality noted. Electronically Signed   By: Inez Catalina M.D.   On: 05/10/2016 16:12    Assessment and Plan  INFECTED HEEL ULCER -  Mushy tissue, drainage  with odor, concern is for deeper infection; have started clindamycin 300 mg q 6 for 10 days     Inocencio Homes, MD

## 2016-05-16 ENCOUNTER — Encounter: Payer: Self-pay | Admitting: Internal Medicine

## 2016-05-23 DIAGNOSIS — L89521 Pressure ulcer of left ankle, stage 1: Secondary | ICD-10-CM | POA: Diagnosis not present

## 2016-05-23 DIAGNOSIS — L89610 Pressure ulcer of right heel, unstageable: Secondary | ICD-10-CM | POA: Diagnosis not present

## 2016-05-23 DIAGNOSIS — L89153 Pressure ulcer of sacral region, stage 3: Secondary | ICD-10-CM | POA: Diagnosis not present

## 2016-05-23 DIAGNOSIS — L89620 Pressure ulcer of left heel, unstageable: Secondary | ICD-10-CM | POA: Diagnosis not present

## 2016-05-26 ENCOUNTER — Encounter: Payer: Self-pay | Admitting: Internal Medicine

## 2016-05-26 NOTE — Progress Notes (Addendum)
Location:  [facility] (31)  Hennie Duos MD  Patient Care Team: Maury Dus, MD as PCP - General [facility] Medicine)  Extended Emergency Contact Information Primary Emergency Contact: Terry,Nicole Address: [address]          [place] [address] [place] Phone: [phone] Mobile Phone: [phone] Relation: Daughter Secondary Emergency Contact: Lizabeth Leyden [place] Mobile Phone: [phone] Relation: Daughter    Allergies: Iohexol and Ivp dye [iodinated diagnostic agents]  Chief Complaint  Patient presents with  . Acute Visit    HPI: Patient is 61 y.o. male who I am seeing in preparation for talking to his daughter , Benjamine Mola.  Past Medical History:  Diagnosis Date  . Adrenal insufficiency ([place])   . Allergy   . Arthritis   . Blind left eye   . Carpal tunnel syndrome   . Cataract    bil removed  . CHF (congestive heart failure) ([place])   . Diabetes mellitus without complication ([place])   . ED (erectile dysfunction)   . Gout   . Herpes   . Hypercholesterolemia   . Hypertension   . MI (myocardial infarction)   . Nausea & vomiting   . Peripheral neuropathy ([place])   . Pneumonia   . PVD (peripheral vascular disease) ([place])     Past Surgical History:  Procedure Laterality Date  . CHOLECYSTECTOMY    . CORONARY ANGIOPLASTY WITH STENT PLACEMENT  2007   two 2.5  x 13 mm Cypher stents to the RCA  . dilatera cataracts removed    . right knee surgary      Allergies as of 05/09/2016      Reactions   Iohexol Hives   Ivp Dye [iodinated Diagnostic Agents] Hives      Medication List       Accurate as of 05/09/16 11:59 PM. Always use your most recent med list.          acetaminophen 160 MG/5ML suspension Commonly known as:  TYLENOL Take 650 mg by mouth every 4 (four) hours as needed for mild pain. 20.3 ml ( 650 mg total)   amLODipine 10 MG tablet Commonly  known as:  NORVASC Take 1 tablet (10 mg total) by mouth daily.   atorvastatin 40 MG tablet Commonly known as:  LIPITOR Take 1 tablet (40 mg total) by mouth daily at 6 PM.   bethanechol 10 MG tablet Commonly known as:  URECHOLINE Take 1 tablet (10 mg total) by mouth 3 (three) times daily.   carvedilol 25 MG tablet Commonly known as:  COREG Take 1 tablet (25 mg total) by mouth 2 (two) times daily with a meal.   cloNIDine 0.3 MG tablet Commonly known as:  CATAPRES Take 1 tablet (0.3 mg total) by mouth 3 (three) times daily.   FLUoxetine 10 MG capsule Commonly known as:  PROZAC Take 1 capsule (10 mg total) by mouth daily.   hydrocerin Crea Apply 1 application topically 2 (two) times daily.   hydrocortisone 2.5 % rectal cream Commonly known as:  ANUSOL-HC Place rectally 2 (two) times daily as needed for hemorrhoids or itching.   insulin aspart 100 UNIT/ML injection Commonly known as:  novoLOG Inject 8 Units into the skin 3 (three) times daily with meals.   isosorbide-hydrALAZINE 20-37.5 MG tablet Commonly known as:  BIDIL Take 2 tablets by mouth 3 (three) times daily.   lidocaine 2 % jelly Commonly  known as:  XYLOCAINE Apply topically as needed (Use with in and out catheter).   mirtazapine 7.5 MG tablet Commonly known as:  REMERON Take 1 tablet (7.5 mg total) by mouth at bedtime.   polyethylene glycol packet Commonly known as:  MIRALAX / GLYCOLAX Take 17 g by mouth daily.   rivaroxaban 20 MG Tabs tablet Commonly known as:  XARELTO Take 1 tablet (20 mg total) by mouth daily with supper.   senna-docusate 8.6-50 MG tablet Commonly known as:  Senokot-S Take 1 tablet by mouth daily.   tamsulosin 0.4 MG Caps capsule Commonly known as:  FLOMAX Take 1 capsule (0.4 mg total) by mouth daily after supper.       No orders of the defined types were placed in this encounter.   Immunization History  Administered Date(s) Administered  . PPD Test 04/25/2016     Social History  Substance Use Topics  . Smoking status: Never Smoker  . Smokeless tobacco: Never Used  . Alcohol use No    Review of Systems  DATA OBTAINED: from nurse GENERAL:  no fevers, fatigue, not eating well at all SKIN: No itching, rash HEENT: No complaint RESPIRATORY: No cough, wheezing, SOB CARDIAC: No chest pain, palpitations, lower extremity edema  GI: No abdominal pain, No N/V/D or constipation, No heartburn or reflux  GU: No dysuria, frequency or urgency, or incontinence  MUSCULOSKELETAL: No unrelieved bone/joint pain NEUROLOGIC: No headache, dizziness  PSYCHIATRIC: No overt anxiety or sadness  Vitals:   05/26/16 1740  BP: (!) 164/74  Pulse: 78  Resp: 20  Temp: 97.3 F (36.3 C)   Body mass index is 32.19 kg/m. Physical Exam  GENERAL APPEARANCE: Alert, min conversant, No acute distress  SKIN: breakdowns csacrum,B heels, all dressed HEENT: Unremarkable RESPIRATORY: Breathing is even, unlabored. Lung sounds are clear   CARDIOVASCULAR: Heart RRR no murmurs, rubs or gallops. No peripheral edema  GASTROINTESTINAL: Abdomen is soft, non-tender, not distended w/ normal bowel sounds.  GENITOURINARY: Bladder non tender, not distended  MUSCULOSKELETAL: No abnormal joints or musculature NEUROLOGIC: Cranial nerves 2-12 grossly intact. Moves all extremities PSYCHIATRIC: Mood and affect with dementia, no behavioral issues  Patient Active Problem List   Diagnosis Date Noted  . Acute respiratory failure with hypoxia ([place]) 05/15/2016  . HLD (hyperlipidemia) 05/15/2016  . Nausea and vomiting   . Hypoxia 05/10/2016  . Elevated troponin 05/10/2016  . AKI (acute kidney injury) ([place]) 05/10/2016  . Pressure injury of skin 05/10/2016  . CKD (chronic kidney disease) stage 3, GFR 30-59 ml/min 04/28/2016  . Hypernatremia 04/28/2016  . Dysphagia due to recent stroke 04/28/2016  . Cystitis due to Pseudomonas 04/28/2016  . Enterococcus UTI 04/28/2016  . Hyperlipidemia  associated with type 2 diabetes mellitus ([place]) 04/28/2016  . Depression 04/28/2016  . Urinary retention   . Accidental fall from chair   . FUO (fever of unknown origin)   . Cognitive and behavioral changes   . Cerebrovascular accident (CVA) due to embolism of left posterior cerebral artery ([place])   . Lethargy   . Acute on chronic renal insufficiency   . Low back pain   . Leukocytosis   . Labile blood glucose   . Hypoglycemia due to insulin   . UTI due to Klebsiella species   . Deep tissue injury   . Renovascular hypertension   . History of DVT (deep vein thrombosis)   . Coronary artery disease involving native coronary artery of native heart without angina pectoris   . Morbid obesity (  [place])   . Stage 2 chronic kidney disease   . Hypokalemia   . Acute blood loss anemia   . Embolic stroke involving posterior cerebral artery ([place]) 04/03/2016  . Brainstem stroke ([place]) 04/01/2016  . Abnormality of gait and mobility   . Cerebral infarction due to thrombosis of other cerebral artery ([place])   . Right arm numbness   . Left sided numbness 03/29/2016  . TIA (transient ischemic attack) 03/29/2016  . Hypertensive urgency 03/29/2016  . Sepsis ([place]) 09/10/2013  . DVT (deep venous thrombosis) ([place]) 01/01/2013  . Rash and nonspecific skin eruption 12/29/2012  . Cellulitis and abscess of leg 12/29/2012  . Diabetes type 2, uncontrolled ([place]) 12/29/2012  . Hypertension   . Cardiomyopathy ([place])   . CHF (congestive heart failure) ([place])   . Peripheral neuropathy ([place])   . Special screening for malignant neoplasms, colon 12/09/2012    CMP     Component Value Date/Time   NA 143 05/12/2016 0446   NA 143 05/12/2016   K 3.8 05/12/2016 0446   CL 109 05/12/2016 0446   CO2 27 05/12/2016 0446   GLUCOSE 240 (H) 05/12/2016 0446   BUN 57 (H) 05/12/2016 0446   BUN 57 (A) 05/12/2016   CREATININE 1.98 (H) 05/12/2016 0446   CALCIUM 8.4 (L) 05/12/2016 0446   PROT 8.1 05/10/2016 1027   ALBUMIN 2.7 (L)  05/10/2016 1027   AST 131 (H) 05/10/2016 1027   ALT 138 (H) 05/10/2016 1027   ALKPHOS 70 05/10/2016 1027   BILITOT 0.5 05/10/2016 1027   GFRNONAA 35 (L) 05/12/2016 0446   GFRAA 41 (L) 05/12/2016 0446    Recent Labs  04/01/16 0221  04/25/16 0357  05/10/16 05/10/16 1027 05/12/16 05/12/16 0446  NA 139  < > 146*  < > 141 141 143 143  K 3.5  < > 3.5  < > 3.8 3.8  --  3.8  CL 105  < > 109  --   --  104  --  109  CO2 26  < > 26  --   --  27  --  27  GLUCOSE 240*  < > 180*  --   --  148*  --  240*  BUN 18  < > 23*  < > 65* 65* 57* 57*  CREATININE 1.49*  < > 1.33*  < > 2.1* 2.05* 2.0* 1.98*  CALCIUM 9.0  < > 9.4  --   --  8.6*  --  8.4*  MG 1.9  --   --   --   --   --   --   --   < > = values in this interval not displayed.  Recent Labs  03/28/16 2113 04/04/16 0516 05/10/16 1027  AST 24 17 131*  ALT 17 12* 138*  ALKPHOS 61 43 70  BILITOT 0.6 0.5 0.5  PROT 7.7 6.9 8.1  ALBUMIN 3.8 2.9* 2.7*    Recent Labs  04/18/16 0441  04/23/16 1151  05/10/16 05/10/16 1027 05/12/16 05/12/16 0446  WBC 8.4  < > 6.7  < > 10.5 10.5 13.5 13.5*  NEUTROABS 4.9  --  4.7  --   --  7.4  --   --   HGB 12.6*  --  13.7  < > 12.6* 12.6*  --  12.1*  HCT 40.0  --  43.7  < > 39* 39.1  --  38.4*  MCV 90.9  --  92.4  --   --  89.9  --  91.6  PLT 344  --  237  < > 253 253  --  271  < > = values in this interval not displayed.  Recent Labs  03/29/16 0531  CHOL 235*  LDLCALC 167*  TRIG 160*   No results found for: MICROALBUR No results found for: TSH Lab Results  Component Value Date   HGBA1C 11.0 (H) 03/29/2016   Lab Results  Component Value Date   CHOL 235 (H) 03/29/2016   HDL 36 (L) 03/29/2016   LDLCALC 167 (H) 03/29/2016   TRIG 160 (H) 03/29/2016   CHOLHDL 6.5 03/29/2016    Significant Diagnostic Results in last 30 days:  Dg Chest 2 View  Result Date: 05/10/2016 CLINICAL DATA:  Weakness. EXAM: CHEST  2 VIEW COMPARISON:  03/29/2016. FINDINGS: Cardiomegaly with normal pulmonary  vascularity. Mild interstitial prominence. Mild CHF cannot be excluded P Low lung volumes. No pleural effusion or pneumothorax. IMPRESSION: Cardiomegaly with mild interstitial prominence. Mild CHF cannot be excluded. Low lung volumes. Electronically Signed   By: Marcello Moores  Register   On: 05/10/2016 12:16   Ct Chest Wo Contrast  Result Date: 05/10/2016 CLINICAL DATA:  Vomiting.  Mental status changes. EXAM: CT CHEST WITHOUT CONTRAST TECHNIQUE: Multidetector CT imaging of the chest was performed following the standard protocol without IV contrast. COMPARISON:  Chest radiography same day and multiple previous. FINDINGS: Cardiovascular: Mild cardiomegaly. No pericardial effusion. Aortic atherosclerosis without aneurysm. Extensive coronary artery calcification. Mediastinum/Nodes: No mediastinal mass or lymphadenopathy. Lungs/Pleura: No pleural fluid. Poor inspiration. Allowing for that, the lungs are clear. No evidence of edema, consolidation or collapse. No evidence of aspiration. Upper Abdomen: Previous cholecystectomy.  No acute finding. Musculoskeletal: Chronic degenerative changes of the thoracic spine. IMPRESSION: No acute finding by CT. Cardiomegaly. Coronary artery calcification. Aortic atherosclerosis. No evidence of pneumonia or aspiration. Electronically Signed   By: Nelson Chimes M.D.   On: 05/10/2016 16:17   Dg Abd 2 Views  Result Date: 05/10/2016 CLINICAL DATA:  Nausea and vomiting EXAM: ABDOMEN - 2 VIEW COMPARISON:  None. FINDINGS: Scattered large and small bowel gas is noted. No acute mass is noted. Some scattered densities are noted over both kidneys likely related to vascular calcifications. Mild degenerative changes of the lumbar spine are seen. No free air is noted. IMPRESSION: No acute abnormality noted. Electronically Signed   By: Inez Catalina M.D.   On: 05/10/2016 16:12    Assessment and Plan   CVA DUE TO EMBOLISM/ ENCOUNTER FOR FAMILY CONFERENCE - pt's daughter Benjamine Mola had multiple  issues and questions about hospital course , meds etc and al were answered I believe to her satisfaction. No changes are being made at this point. I told her to look to the 72 hour conference.     Time spent > 35 min Inocencio Homes, MD

## 2016-05-29 DIAGNOSIS — L89620 Pressure ulcer of left heel, unstageable: Secondary | ICD-10-CM | POA: Diagnosis not present

## 2016-05-29 DIAGNOSIS — L89610 Pressure ulcer of right heel, unstageable: Secondary | ICD-10-CM | POA: Diagnosis not present

## 2016-05-29 DIAGNOSIS — L89521 Pressure ulcer of left ankle, stage 1: Secondary | ICD-10-CM | POA: Diagnosis not present

## 2016-05-29 DIAGNOSIS — L89153 Pressure ulcer of sacral region, stage 3: Secondary | ICD-10-CM | POA: Diagnosis not present

## 2016-05-30 ENCOUNTER — Encounter: Payer: Medicare Other | Attending: Physical Medicine & Rehabilitation | Admitting: Physical Medicine & Rehabilitation

## 2016-06-04 DIAGNOSIS — F4320 Adjustment disorder, unspecified: Secondary | ICD-10-CM | POA: Diagnosis not present

## 2016-06-04 DIAGNOSIS — F329 Major depressive disorder, single episode, unspecified: Secondary | ICD-10-CM | POA: Diagnosis not present

## 2016-06-04 DIAGNOSIS — F419 Anxiety disorder, unspecified: Secondary | ICD-10-CM | POA: Diagnosis not present

## 2016-06-05 ENCOUNTER — Ambulatory Visit (INDEPENDENT_AMBULATORY_CARE_PROVIDER_SITE_OTHER): Payer: Medicare Other | Admitting: Physician Assistant

## 2016-06-05 ENCOUNTER — Encounter: Payer: Self-pay | Admitting: Physician Assistant

## 2016-06-05 VITALS — BP 114/57 | HR 66 | Ht 69.0 in | Wt 238.0 lb

## 2016-06-05 DIAGNOSIS — I5032 Chronic diastolic (congestive) heart failure: Secondary | ICD-10-CM | POA: Diagnosis not present

## 2016-06-05 DIAGNOSIS — Z79899 Other long term (current) drug therapy: Secondary | ICD-10-CM

## 2016-06-05 DIAGNOSIS — I639 Cerebral infarction, unspecified: Secondary | ICD-10-CM | POA: Diagnosis not present

## 2016-06-05 LAB — CBC
HCT: 31.8 % — ABNORMAL LOW (ref 38.5–50.0)
Hemoglobin: 10.4 g/dL — ABNORMAL LOW (ref 13.2–17.1)
MCH: 28.2 pg (ref 27.0–33.0)
MCHC: 32.7 g/dL (ref 32.0–36.0)
MCV: 86.2 fL (ref 80.0–100.0)
MPV: 8.6 fL (ref 7.5–12.5)
PLATELETS: 274 10*3/uL (ref 140–400)
RBC: 3.69 MIL/uL — ABNORMAL LOW (ref 4.20–5.80)
RDW: 14 % (ref 11.0–15.0)
WBC: 7 10*3/uL (ref 3.8–10.8)

## 2016-06-05 LAB — BASIC METABOLIC PANEL
BUN: 15 mg/dL (ref 7–25)
CO2: 25 mmol/L (ref 20–31)
CREATININE: 1.08 mg/dL (ref 0.70–1.25)
Calcium: 7.9 mg/dL — ABNORMAL LOW (ref 8.6–10.3)
Chloride: 107 mmol/L (ref 98–110)
Glucose, Bld: 145 mg/dL — ABNORMAL HIGH (ref 65–99)
POTASSIUM: 4.4 mmol/L (ref 3.5–5.3)
Sodium: 141 mmol/L (ref 135–146)

## 2016-06-05 LAB — CBC AND DIFFERENTIAL: WBC: 7 10*3/mL

## 2016-06-05 NOTE — Progress Notes (Signed)
Cardiology Office Note   Date:  06/05/2016   ID:  James, Johnson 29-Oct-1955, MRN [id number]  PCP:  [name], MD  Cardiologist:  New to Dr Debara Pickett during 04/2016 admit  Lashika Erker, Suanne Marker, PA-C   Chief Complaint  Patient presents with  . Follow-up    History of Present Illness: [name] is a 61 y.o. male with a history of CVA, HTN, DM2, ICM w/ EF 25-30% by echo 03/2016, DVT on Xarelto, periph neuropathy, blind L eye.   Admit 03/15-03/17/2018 for acute respiratory failure, Lasix placed on hold at discharge and patient was to get a recheck of labs the following week. This was because BUN and creatinine were elevated on admission, above baseline. Chlorthalidone and losartan on hold. Creatinine 1.9 discharge. Mild troponin elevation felt secondary to hypoxia, EF 55% with grade 1 diastolic dysfunction by echo, EF previously 25-30 percent 03/2016. Because of multiple comorbidities, he is not a candidate for ischemic evaluation.  [name] presents for Post hospital follow-up.  Mr. Foot is conscious and alert on the stretcher. He is minimally responsive to questions but denies chest pain or shortness of breath. On physical exam, there is no difficulty with respirations and he does not appear to be in acute discomfort. He moves himself very little. According to records, he does not exert himself very much. The staff gives him his medications and so he is compliant.   Past Medical History:  Diagnosis Date  . Adrenal insufficiency ([place])   . Allergy   . Arthritis   . Blind left eye   . Carpal tunnel syndrome   . Cataract    bil removed  . CHF (congestive heart failure) ([place])   . Diabetes mellitus without complication ([name])   . ED (erectile dysfunction)   . Gout   . Herpes   . Hypercholesterolemia   . Hypertension   . MI (myocardial infarction)   . Nausea & vomiting   . Peripheral neuropathy ([place])   . Pneumonia   . PVD (peripheral vascular disease) ([place])      Past Surgical History:  Procedure Laterality Date  . CHOLECYSTECTOMY    . CORONARY ANGIOPLASTY WITH STENT PLACEMENT  2007   two 2.5  x 13 mm Cypher stents to the RCA  . dilatera cataracts removed    . right knee surgary      Current Outpatient Prescriptions  Medication Sig Dispense Refill  . acetaminophen (TYLENOL) 160 MG/5ML suspension Take 650 mg by mouth daily as needed for mild pain. 20.3 ml ( 650 mg total)     . albuterol (PROVENTIL) (2.5 MG/3ML) 0.083% nebulizer solution Take 3 mLs (2.5 mg total) by nebulization every 2 (two) hours as needed for shortness of breath. 75 mL 12  . Amino Acids-Protein Hydrolys (FEEDING SUPPLEMENT, PRO-STAT SUGAR FREE 64,) LIQD Take 30 mLs by mouth 2 (two) times daily.    Marland Kitchen amLODipine (NORVASC) 10 MG tablet Take 1 tablet (10 mg total) by mouth daily.    Marland Kitchen atorvastatin (LIPITOR) 40 MG tablet Take 1 tablet (40 mg total) by mouth daily at 6 PM. 30 tablet 0  . bethanechol (URECHOLINE) 10 MG tablet Take 1 tablet (10 mg total) by mouth 3 (three) times daily.    . bisacodyl (DULCOLAX) 10 MG suppository Place 10 mg rectally as needed for moderate constipation.    . carvedilol (COREG) 25 MG tablet Take 1 tablet (25 mg total) by mouth 2 (two) times daily with a meal.  60 tablet 0  . cloNIDine (CATAPRES) 0.3 MG tablet Take 1 tablet (0.3 mg total) by mouth 3 (three) times daily. 60 tablet 11  . FLUoxetine (PROZAC) 10 MG capsule Take 1 capsule (10 mg total) by mouth daily.  3  . GLUCERNA (GLUCERNA) LIQD Take 237 mLs by mouth 4 (four) times daily.    . hydrocerin (EUCERIN) CREA Apply 1 application topically 2 (two) times daily.  0  . hydrocortisone (ANUSOL-HC) 2.5 % rectal cream Place rectally 2 (two) times daily as needed for hemorrhoids or itching. 30 g 0  . insulin aspart (NOVOLOG) 100 UNIT/ML FlexPen Inject into the skin 3 (three) times daily with meals. CBG with meals and SSI as follows less than 150 no insulin, 151 - 200 = 3 units, 201 - 250 = 5 units, 251 -  300 = 8 units, 301 - 350 = 11 units, 351 - 400 = 15 units, greater than 400 give 15 units and call MD    . Insulin Glargine (TOUJEO SOLOSTAR) 300 UNIT/ML SOPN Inject 60 Units into the skin at bedtime.    . isosorbide-hydrALAZINE (BIDIL) 20-37.5 MG tablet Take 2 tablets by mouth 3 (three) times daily. 90 tablet 0  . lidocaine (XYLOCAINE) 2 % jelly Apply topically as needed (Use with in and out catheter). 30 mL 0  . Magnesium Hydroxide (MILK OF MAGNESIA PO) Take 30 mLs by mouth as needed (for constipation).    . mirtazapine (REMERON) 7.5 MG tablet Take 1 tablet (7.5 mg total) by mouth at bedtime.    . Multiple Vitamins-Minerals (MULTIVITAMIN PO) Take 1 tablet by mouth daily.    . nitroGLYCERIN (NITROSTAT) 0.4 MG SL tablet Place 1 tablet (0.4 mg total) under the tongue every 5 (five) minutes as needed for chest pain.  12  . ondansetron (ZOFRAN) 4 MG tablet Take 4 mg by mouth every 6 (six) hours as needed for nausea or vomiting.    . polyethylene glycol (MIRALAX / GLYCOLAX) packet Take 17 g by mouth daily. 14 each 0  . rivaroxaban (XARELTO) 20 MG TABS tablet Take 1 tablet (20 mg total) by mouth daily with supper. 30 tablet   . senna-docusate (SENOKOT-S) 8.6-50 MG tablet Take 1 tablet by mouth daily. 30 tablet 0  . Sodium Phosphates (RA SALINE ENEMA) 19-7 GM/118ML ENEM Place 1 each rectally as needed (for constipation).    . tamsulosin (FLOMAX) 0.4 MG CAPS capsule Take 1 capsule (0.4 mg total) by mouth daily after supper. 30 capsule   . zinc sulfate 220 (50 Zn) MG capsule Take 220 mg by mouth daily.     No current facility-administered medications for this visit.     Allergies:   Iohexol and Ivp dye [iodinated diagnostic agents]    Social History:  The patient  reports that he has never smoked. He has never used smokeless tobacco. He reports that he does not drink alcohol or use drugs.   Family History:  The patient's family history includes Breast cancer in his mother; Diabetes in his maternal  aunt; Heart attack in his sister; Hypertension in his father.    ROS:  Please see the history of present illness. All other systems are reviewed and negative.    PHYSICAL EXAM: VS:  BP (!) 114/57   Pulse 66   Ht 5\' 9"  (1.753 m)   Wt 238 lb (108 kg)   BMI 35.15 kg/m  , BMI Body mass index is 35.15 kg/m. GEN: Well nourished, well developed, male in no acute  distress  HEENT: normal for age  Neck: no JVD, Soft bilateral carotid bruits, no masses Cardiac: RRR; soft murmur, no rubs, or gallops Respiratory: Few rales bases but good air exchange bilaterally, normal work of breathing GI: soft, nontender, nondistended, + BS MS: no deformity or atrophy; no edema; distal pulses are 2+ in 2/4 extremities. Both feet were bandaged and dressed and in boots and not disturbed. Skin: warm and dry, no rash  EKG:  EKG is not ordered today.  Recent Labs: 04/01/2016: Magnesium 1.9 05/10/2016: ALT 138; B Natriuretic Peptide 53.8 05/12/2016: BUN 57; Creatinine, Ser 1.98; Hemoglobin 12.1; Platelets 271; Potassium 3.8; Sodium 143    Lipid Panel    Component Value Date/Time   CHOL 235 (H) 03/29/2016 0531   TRIG 160 (H) 03/29/2016 0531   HDL 36 (L) 03/29/2016 0531   CHOLHDL 6.5 03/29/2016 0531   VLDL 32 03/29/2016 0531   LDLCALC 167 (H) 03/29/2016 0531     Wt Readings from Last 3 Encounters:  06/05/16 238 lb (108 kg)  05/15/16 218 lb (98.9 kg)  05/14/16 218 lb 0.6 oz (98.9 kg)     Other studies Reviewed: Additional studies/ records that were reviewed today include: Hospital records and testing.  ASSESSMENT AND PLAN:  1.  Chronic diastolic CHF: His EF had previously been 25-30 percent but it improved when it was checked during his most recent hospitalization. It is now normal. He has no signs or symptoms of volume overload by exam. He is doing well from a cardiac standpoint. Continue medications at the current rate. He has not had a BMET or CBC checked since discharge, we will do these today.  Follow-up on the results. He is not having any ongoing ischemic symptoms, no further evaluation is indicated.   Current medicines are reviewed at length with the patient today.  The patient does not have concerns regarding medicines.  The following changes have been made:  no change  Labs/ tests ordered today include:   Orders Placed This Encounter  Procedures  . Basic metabolic panel  . CBC     Disposition:   FU with Dr. Debara Pickett  Signed, Rosaria Ferries, PA-C  06/05/2016 12:07 PM    [place] Phone: [phone]; Fax: [phone]  This note was written with the assistance of speech recognition software. Please excuse any transcriptional errors.

## 2016-06-05 NOTE — Patient Instructions (Addendum)
Medication Instructions:  TAKE LASIX 40MG  DAILY AS NEEDED FOR SWELLING AND WEIGHT GAIN  If you need a refill on your cardiac medications before your next appointment, please call your pharmacy.  Labwork: BMET AND CBC TODAY AT [facility] ON THE 1ST FLOOR  Follow-Up: Your physician wants you to follow-up in: 6 MONTHS WITH DR HILTY You will receive a reminder letter in the mail two months in advance. If you don't receive a letter, please call our office AUGUST 2018 to schedule the October 2018 follow-up appointment.   Special Instructions:     Thank you for choosing [facility]!!    RHONDA BARRETT, Revonda Humphrey, [place]

## 2016-06-07 DIAGNOSIS — L89521 Pressure ulcer of left ankle, stage 1: Secondary | ICD-10-CM | POA: Diagnosis not present

## 2016-06-07 DIAGNOSIS — L89153 Pressure ulcer of sacral region, stage 3: Secondary | ICD-10-CM | POA: Diagnosis not present

## 2016-06-07 DIAGNOSIS — L89620 Pressure ulcer of left heel, unstageable: Secondary | ICD-10-CM | POA: Diagnosis not present

## 2016-06-07 DIAGNOSIS — L89610 Pressure ulcer of right heel, unstageable: Secondary | ICD-10-CM | POA: Diagnosis not present

## 2016-06-11 ENCOUNTER — Encounter: Payer: Self-pay | Admitting: Surgery

## 2016-06-11 ENCOUNTER — Ambulatory Visit (INDEPENDENT_AMBULATORY_CARE_PROVIDER_SITE_OTHER): Payer: Medicare Other | Admitting: Surgery

## 2016-06-11 ENCOUNTER — Telehealth: Payer: Self-pay | Admitting: Surgery

## 2016-06-11 ENCOUNTER — Encounter (HOSPITAL_COMMUNITY): Payer: Medicare Other

## 2016-06-11 VITALS — BP 142/81 | HR 73 | Temp 98.2°F | Resp 18 | Ht 69.0 in | Wt 238.0 lb

## 2016-06-11 DIAGNOSIS — L89610 Pressure ulcer of right heel, unstageable: Secondary | ICD-10-CM | POA: Diagnosis not present

## 2016-06-11 DIAGNOSIS — L89153 Pressure ulcer of sacral region, stage 3: Secondary | ICD-10-CM | POA: Diagnosis not present

## 2016-06-11 DIAGNOSIS — I639 Cerebral infarction, unspecified: Secondary | ICD-10-CM | POA: Diagnosis not present

## 2016-06-11 DIAGNOSIS — I7025 Atherosclerosis of native arteries of other extremities with ulceration: Secondary | ICD-10-CM

## 2016-06-11 DIAGNOSIS — L89620 Pressure ulcer of left heel, unstageable: Secondary | ICD-10-CM | POA: Diagnosis not present

## 2016-06-11 DIAGNOSIS — L89521 Pressure ulcer of left ankle, stage 1: Secondary | ICD-10-CM | POA: Diagnosis not present

## 2016-06-11 NOTE — Progress Notes (Signed)
[facility]  Patient name: [name] MRN: [id number] DOB: 1956-01-29 Sex: male   REFERRING PROVIDER:    Dr. Sheppard Coil   REASON FOR CONSULT:    Bilateral heel wounds  HISTORY OF PRESENT ILLNESS:   [name] is a 61 y.o. male, who is Referred today for evaluation of bilateral heel ulcers.  He has a history of stroke with some right-sided weakness.  He has a history of congestive heart failure.  He suffers from diabetes without complication.  He takes a statin for hypercholesterolemia.  He is on Xaralto for DVT.  PAST MEDICAL HISTORY    Past Medical History:  Diagnosis Date  . Adrenal insufficiency ([place])   . Allergy   . Arthritis   . Blind left eye   . Carpal tunnel syndrome   . Cataract    bil removed  . CHF (congestive heart failure) ([place])   . Diabetes mellitus without complication ([place])   . ED (erectile dysfunction)   . Gout   . Herpes   . Hypercholesterolemia   . Hypertension   . MI (myocardial infarction) ([place])   . Nausea & vomiting   . Peripheral neuropathy   . Pneumonia   . PVD (peripheral vascular disease) ([place])      FAMILY HISTORY   Family History  Problem Relation Age of Onset  . Breast cancer Mother   . Hypertension Father     blood clot in leg  . Diabetes Maternal Aunt   . Heart attack Sister   . Colon cancer Neg Hx   . Esophageal cancer Neg Hx   . Rectal cancer Neg Hx   . Stomach cancer Neg Hx     SOCIAL HISTORY:   Social History   Social History  . Marital status: Married    Spouse name: N/A  . Number of children: N/A  . Years of education: N/A   Occupational History  . Not on file.   Social History Main Topics  . Smoking status: Never Smoker  . Smokeless tobacco: Never Used  . Alcohol use No  . Drug use: No  . Sexual activity: No   Other Topics Concern  . Not on file   Social History Narrative  . No narrative on file    ALLERGIES:    Allergies   Allergen Reactions  . Iohexol Hives  . Ivp Dye [Iodinated Diagnostic Agents] Hives    CURRENT MEDICATIONS:    Current Outpatient Prescriptions  Medication Sig Dispense Refill  . acetaminophen (TYLENOL) 160 MG/5ML suspension Take 650 mg by mouth daily as needed for mild pain. 20.3 ml ( 650 mg total)     . albuterol (PROVENTIL) (2.5 MG/3ML) 0.083% nebulizer solution Take 3 mLs (2.5 mg total) by nebulization every 2 (two) hours as needed for shortness of breath. 75 mL 12  . Amino Acids-Protein Hydrolys (FEEDING SUPPLEMENT, PRO-STAT SUGAR FREE 64,) LIQD Take 30 mLs by mouth 2 (two) times daily.    Marland Kitchen amLODipine (NORVASC) 10 MG tablet Take 1 tablet (10 mg total) by mouth daily.    Marland Kitchen atorvastatin (LIPITOR) 40 MG tablet Take 1 tablet (40 mg total) by mouth daily at 6 PM. 30 tablet 0  . bethanechol (URECHOLINE) 10 MG tablet Take 1 tablet (10 mg total) by mouth 3 (three) times daily.    . bisacodyl (DULCOLAX) 10 MG suppository Place 10 mg rectally as needed for moderate constipation.    . carvedilol (COREG) 25 MG tablet Take  1 tablet (25 mg total) by mouth 2 (two) times daily with a meal. 60 tablet 0  . cloNIDine (CATAPRES) 0.3 MG tablet Take 1 tablet (0.3 mg total) by mouth 3 (three) times daily. 60 tablet 11  . FLUoxetine (PROZAC) 10 MG capsule Take 1 capsule (10 mg total) by mouth daily.  3  . GLUCERNA (GLUCERNA) LIQD Take 237 mLs by mouth 4 (four) times daily.    . hydrocerin (EUCERIN) CREA Apply 1 application topically 2 (two) times daily.  0  . hydrocortisone (ANUSOL-HC) 2.5 % rectal cream Place rectally 2 (two) times daily as needed for hemorrhoids or itching. 30 g 0  . insulin aspart (NOVOLOG) 100 UNIT/ML FlexPen Inject into the skin 3 (three) times daily with meals. CBG with meals and SSI as follows less than 150 no insulin, 151 - 200 = 3 units, 201 - 250 = 5 units, 251 - 300 = 8 units, 301 - 350 = 11 units, 351 - 400 = 15 units, greater than 400 give 15 units and call MD    . Insulin  Glargine (TOUJEO SOLOSTAR) 300 UNIT/ML SOPN Inject 60 Units into the skin at bedtime.    . isosorbide-hydrALAZINE (BIDIL) 20-37.5 MG tablet Take 2 tablets by mouth 3 (three) times daily. 90 tablet 0  . lidocaine (XYLOCAINE) 2 % jelly Apply topically as needed (Use with in and out catheter). 30 mL 0  . Magnesium Hydroxide (MILK OF MAGNESIA PO) Take 30 mLs by mouth as needed (for constipation).    . mirtazapine (REMERON) 7.5 MG tablet Take 1 tablet (7.5 mg total) by mouth at bedtime.    . Multiple Vitamins-Minerals (MULTIVITAMIN PO) Take 1 tablet by mouth daily.    . nitroGLYCERIN (NITROSTAT) 0.4 MG SL tablet Place 1 tablet (0.4 mg total) under the tongue every 5 (five) minutes as needed for chest pain.  12  . ondansetron (ZOFRAN) 4 MG tablet Take 4 mg by mouth every 6 (six) hours as needed for nausea or vomiting.    . polyethylene glycol (MIRALAX / GLYCOLAX) packet Take 17 g by mouth daily. 14 each 0  . rivaroxaban (XARELTO) 20 MG TABS tablet Take 1 tablet (20 mg total) by mouth daily with supper. 30 tablet   . senna-docusate (SENOKOT-S) 8.6-50 MG tablet Take 1 tablet by mouth daily. 30 tablet 0  . Sodium Phosphates (RA SALINE ENEMA) 19-7 GM/118ML ENEM Place 1 each rectally as needed (for constipation).    . tamsulosin (FLOMAX) 0.4 MG CAPS capsule Take 1 capsule (0.4 mg total) by mouth daily after supper. 30 capsule   . zinc sulfate 220 (50 Zn) MG capsule Take 220 mg by mouth daily.     No current facility-administered medications for this visit.     REVIEW OF SYSTEMS:   [X]  denotes positive finding, [ ]  denotes negative finding Cardiac  Comments:  Chest pain or chest pressure:    Shortness of breath upon exertion:    Short of breath when lying flat:    Irregular heart rhythm:        Vascular    Pain in calf, thigh, or hip brought on by ambulation:    Pain in feet at night that wakes you up from your sleep:     Blood clot in your veins:    Leg swelling:         Pulmonary    Oxygen at  home:    Productive cough:     Wheezing:  Neurologic    Sudden weakness in arms or legs:  x   Sudden numbness in arms or legs:  x   Sudden onset of difficulty speaking or slurred speech:    Temporary loss of vision in one eye:     Problems with dizziness:         Gastrointestinal    Blood in stool:      Vomited blood:         Genitourinary    Burning when urinating:     Blood in urine:        Psychiatric    Major depression:         Hematologic    Bleeding problems:    Problems with blood clotting too easily:        Skin    Rashes or ulcers: x       Constitutional    Fever or chills:     PHYSICAL EXAM:   Vitals:   06/11/16 1323 06/11/16 1327  BP: (!) 144/72 (!) 142/81  Pulse: 73   Resp: 18   Temp: 98.2 F (36.8 C)   TempSrc: Oral   SpO2: 95%   Weight: 238 lb (108 kg)   Height: 5\' 9"  (1.753 m)     GENERAL: The patient is a well-nourished male, in no acute distress. The vital signs are documented above. CARDIAC: There is a regular rate and rhythm.  VASCULAR: Nonpalpable pedal pulses. PULMONARY: Nonlabored respirations MUSCULOSKELETAL: There are no major deformities or cyanosis. NEUROLOGIC: No focal weakness or paresthesias are detected. SKIN: Large left heel ulcer with dry black eschar.  Also large eschar which is dry and black on the right lateral malleolus. PSYCHIATRIC: The patient has a normal affect.  STUDIES:   I have reviewed his outside ultrasound which shows moderate to severe stenosis on the right superficial femoral artery with possible occlusion of the left superficial femoral artery  ASSESSMENT and PLAN   Bilateral heel ulcers: I discussed with the patient that there are multiple reasons for nonhealing ulcers and eschar on his leg.  The first most important is the impact of pressure that is being applied to this area.  I suspect both of these areas are pressure ulcers.  He currently does wear protective boots.  Second reason for  nonhealing wounds would be poor circulation.  His ultrasound confirms that he has a left superficial femoral artery occlusion and diffuse disease on the right.  I think the next course of action should be to proceed with angiography.  This will be done through a right femoral approach.  I'll study both legs and intervene on the left if indicated.  I have scheduled this for Tuesday, April 24.  He will stop his Jennye Moccasin prior to the procedure.   [name], MD [facility] [phone] Pager [phone]

## 2016-06-11 NOTE — Telephone Encounter (Signed)
Made wound care appt for 4/26 @ 8:45, Rober Minion is aware of appt 06/11/16 bg

## 2016-06-12 ENCOUNTER — Other Ambulatory Visit: Payer: Self-pay | Admitting: Surgery

## 2016-06-12 ENCOUNTER — Other Ambulatory Visit: Payer: Self-pay

## 2016-06-18 DIAGNOSIS — I70244 Atherosclerosis of native arteries of left leg with ulceration of heel and midfoot: Secondary | ICD-10-CM | POA: Diagnosis not present

## 2016-06-18 DIAGNOSIS — I70234 Atherosclerosis of native arteries of right leg with ulceration of heel and midfoot: Secondary | ICD-10-CM | POA: Diagnosis not present

## 2016-06-18 DIAGNOSIS — I70233 Atherosclerosis of native arteries of right leg with ulceration of ankle: Secondary | ICD-10-CM | POA: Diagnosis not present

## 2016-06-19 ENCOUNTER — Encounter (HOSPITAL_COMMUNITY): Payer: Self-pay | Admitting: Surgery

## 2016-06-19 ENCOUNTER — Encounter (HOSPITAL_COMMUNITY)
Admission: RE | Disposition: A | Discharge status: Skilled Nursing Facility | Payer: Self-pay | Source: Ambulatory Visit | Attending: Surgery

## 2016-06-19 ENCOUNTER — Ambulatory Visit (HOSPITAL_COMMUNITY)
Admission: RE | Admit: 2016-06-19 | Discharge: 2016-06-19 | Disposition: A | Discharge status: Skilled Nursing Facility | Payer: Medicare Other | Source: Ambulatory Visit | Attending: Surgery | Admitting: Surgery

## 2016-06-19 ENCOUNTER — Other Ambulatory Visit: Payer: Self-pay | Admitting: *Deleted

## 2016-06-19 ENCOUNTER — Telehealth: Payer: Self-pay | Admitting: Surgery

## 2016-06-19 DIAGNOSIS — E1142 Type 2 diabetes mellitus with diabetic polyneuropathy: Secondary | ICD-10-CM | POA: Diagnosis not present

## 2016-06-19 DIAGNOSIS — E7800 Pure hypercholesterolemia, unspecified: Secondary | ICD-10-CM | POA: Insufficient documentation

## 2016-06-19 DIAGNOSIS — M109 Gout, unspecified: Secondary | ICD-10-CM | POA: Insufficient documentation

## 2016-06-19 DIAGNOSIS — I70244 Atherosclerosis of native arteries of left leg with ulceration of heel and midfoot: Secondary | ICD-10-CM | POA: Diagnosis not present

## 2016-06-19 DIAGNOSIS — E2740 Unspecified adrenocortical insufficiency: Secondary | ICD-10-CM | POA: Insufficient documentation

## 2016-06-19 DIAGNOSIS — M1990 Unspecified osteoarthritis, unspecified site: Secondary | ICD-10-CM | POA: Insufficient documentation

## 2016-06-19 DIAGNOSIS — E1151 Type 2 diabetes mellitus with diabetic peripheral angiopathy without gangrene: Secondary | ICD-10-CM | POA: Diagnosis not present

## 2016-06-19 DIAGNOSIS — I701 Atherosclerosis of renal artery: Secondary | ICD-10-CM | POA: Insufficient documentation

## 2016-06-19 DIAGNOSIS — I2510 Atherosclerotic heart disease of native coronary artery without angina pectoris: Secondary | ICD-10-CM | POA: Insufficient documentation

## 2016-06-19 DIAGNOSIS — I739 Peripheral vascular disease, unspecified: Secondary | ICD-10-CM

## 2016-06-19 DIAGNOSIS — L97419 Non-pressure chronic ulcer of right heel and midfoot with unspecified severity: Secondary | ICD-10-CM | POA: Diagnosis not present

## 2016-06-19 DIAGNOSIS — Z91041 Radiographic dye allergy status: Secondary | ICD-10-CM | POA: Insufficient documentation

## 2016-06-19 DIAGNOSIS — Z7901 Long term (current) use of anticoagulants: Secondary | ICD-10-CM | POA: Insufficient documentation

## 2016-06-19 DIAGNOSIS — I509 Heart failure, unspecified: Secondary | ICD-10-CM | POA: Insufficient documentation

## 2016-06-19 DIAGNOSIS — I70234 Atherosclerosis of native arteries of right leg with ulceration of heel and midfoot: Secondary | ICD-10-CM | POA: Insufficient documentation

## 2016-06-19 DIAGNOSIS — Z8249 Family history of ischemic heart disease and other diseases of the circulatory system: Secondary | ICD-10-CM | POA: Insufficient documentation

## 2016-06-19 DIAGNOSIS — I69351 Hemiplegia and hemiparesis following cerebral infarction affecting right dominant side: Secondary | ICD-10-CM | POA: Insufficient documentation

## 2016-06-19 DIAGNOSIS — L97429 Non-pressure chronic ulcer of left heel and midfoot with unspecified severity: Secondary | ICD-10-CM | POA: Diagnosis not present

## 2016-06-19 DIAGNOSIS — Z794 Long term (current) use of insulin: Secondary | ICD-10-CM | POA: Insufficient documentation

## 2016-06-19 DIAGNOSIS — Z01810 Encounter for preprocedural cardiovascular examination: Secondary | ICD-10-CM

## 2016-06-19 DIAGNOSIS — I110 Hypertensive heart disease with heart failure: Secondary | ICD-10-CM | POA: Insufficient documentation

## 2016-06-19 HISTORY — PX: ABDOMINAL AORTOGRAM W/LOWER EXTREMITY: CATH118223

## 2016-06-19 LAB — POCT I-STAT, CHEM 8
BUN: 12 mg/dL (ref 6–20)
CALCIUM ION: 1.22 mmol/L (ref 1.15–1.40)
CHLORIDE: 103 mmol/L (ref 101–111)
Creatinine, Ser: 0.9 mg/dL (ref 0.61–1.24)
Glucose, Bld: 107 mg/dL — ABNORMAL HIGH (ref 65–99)
HEMATOCRIT: 32 % — AB (ref 39.0–52.0)
Hemoglobin: 10.9 g/dL — ABNORMAL LOW (ref 13.0–17.0)
Potassium: 3.7 mmol/L (ref 3.5–5.1)
SODIUM: 140 mmol/L (ref 135–145)
TCO2: 29 mmol/L (ref 0–100)

## 2016-06-19 LAB — GLUCOSE, CAPILLARY
GLUCOSE-CAPILLARY: 107 mg/dL — AB (ref 65–99)
GLUCOSE-CAPILLARY: 72 mg/dL (ref 65–99)
Glucose-Capillary: 214 mg/dL — ABNORMAL HIGH (ref 65–99)

## 2016-06-19 LAB — BASIC METABOLIC PANEL
BUN: 12 mg/dL (ref 4–21)
Creatinine: 0.9 mg/dL (ref 0.6–1.3)
Glucose: 107 mg/dL
Sodium: 140 mmol/L (ref 137–147)

## 2016-06-19 SURGERY — ABDOMINAL AORTOGRAM W/LOWER EXTREMITY
Anesthesia: LOCAL

## 2016-06-19 MED ORDER — LABETALOL HCL 5 MG/ML IV SOLN: 10.0000 mg | INTRAVENOUS | Status: DC | PRN

## 2016-06-19 MED ORDER — FENTANYL CITRATE (PF) 100 MCG/2ML IJ SOLN
INTRAMUSCULAR | Status: AC
  Filled 2016-06-19: qty 2

## 2016-06-19 MED ORDER — ACETAMINOPHEN 325 MG RE SUPP: 325.0000 mg | RECTAL | Status: DC | PRN

## 2016-06-19 MED ORDER — DIPHENHYDRAMINE HCL 50 MG/ML IJ SOLN
INTRAMUSCULAR | Status: AC
  Administered 2016-06-19: 25 mg via INTRAVENOUS
  Filled 2016-06-19: qty 1

## 2016-06-19 MED ORDER — HYDRALAZINE HCL 20 MG/ML IJ SOLN: 5.0000 mg | INTRAMUSCULAR | Status: DC | PRN

## 2016-06-19 MED ORDER — ALUM & MAG HYDROXIDE-SIMETH 200-200-20 MG/5ML PO SUSP: 15.0000 mL | ORAL | Status: DC | PRN

## 2016-06-19 MED ORDER — IODIXANOL 320 MG/ML IV SOLN
INTRAVENOUS | Status: DC | PRN
  Administered 2016-06-19: 133 mL via INTRA_ARTERIAL

## 2016-06-19 MED ORDER — FAMOTIDINE IN NACL 20-0.9 MG/50ML-% IV SOLN
20.0000 mg | INTRAVENOUS | Status: AC
  Administered 2016-06-19: 20 mg via INTRAVENOUS

## 2016-06-19 MED ORDER — SODIUM CHLORIDE 0.9 % IV SOLN: 1.0000 mL/kg/h | INTRAVENOUS | Status: DC

## 2016-06-19 MED ORDER — OXYCODONE HCL 5 MG PO TABS: 5.0000 mg | ORAL_TABLET | ORAL | Status: DC | PRN

## 2016-06-19 MED ORDER — HEPARIN (PORCINE) IN NACL 2-0.9 UNIT/ML-% IJ SOLN
INTRAMUSCULAR | Status: DC | PRN
  Administered 2016-06-19: 1000 mL

## 2016-06-19 MED ORDER — LIDOCAINE HCL (PF) 1 % IJ SOLN
INTRAMUSCULAR | Status: DC | PRN
  Administered 2016-06-19: 15 mL via SUBCUTANEOUS

## 2016-06-19 MED ORDER — SODIUM CHLORIDE 0.9 % IV SOLN
INTRAVENOUS | Status: DC
  Administered 2016-06-19: 08:00:00 via INTRAVENOUS

## 2016-06-19 MED ORDER — FAMOTIDINE IN NACL 20-0.9 MG/50ML-% IV SOLN
INTRAVENOUS | Status: AC
  Administered 2016-06-19: 20 mg via INTRAVENOUS
  Filled 2016-06-19: qty 50

## 2016-06-19 MED ORDER — PHENOL 1.4 % MT LIQD: 1.0000 | OROMUCOSAL | Status: DC | PRN

## 2016-06-19 MED ORDER — METHYLPREDNISOLONE SODIUM SUCC 125 MG IJ SOLR
125.0000 mg | INTRAMUSCULAR | Status: AC
  Administered 2016-06-19: 125 mg via INTRAVENOUS

## 2016-06-19 MED ORDER — MIDAZOLAM HCL 2 MG/2ML IJ SOLN
INTRAMUSCULAR | Status: AC
  Filled 2016-06-19: qty 2

## 2016-06-19 MED ORDER — ACETAMINOPHEN 325 MG PO TABS: 325.0000 mg | ORAL_TABLET | ORAL | Status: DC | PRN

## 2016-06-19 MED ORDER — MIDAZOLAM HCL 2 MG/2ML IJ SOLN
INTRAMUSCULAR | Status: DC | PRN
  Administered 2016-06-19: 1 mg via INTRAVENOUS

## 2016-06-19 MED ORDER — METHYLPREDNISOLONE SODIUM SUCC 125 MG IJ SOLR
INTRAMUSCULAR | Status: AC
  Administered 2016-06-19: 125 mg via INTRAVENOUS
  Filled 2016-06-19: qty 2

## 2016-06-19 MED ORDER — LIDOCAINE HCL 1 % IJ SOLN
INTRAMUSCULAR | Status: AC
  Filled 2016-06-19: qty 20

## 2016-06-19 MED ORDER — ONDANSETRON HCL 4 MG/2ML IJ SOLN: 4.0000 mg | Freq: Four times a day (QID) | INTRAMUSCULAR | Status: DC | PRN

## 2016-06-19 MED ORDER — METOPROLOL TARTRATE 5 MG/5ML IV SOLN: 2.0000 mg | INTRAVENOUS | Status: DC | PRN

## 2016-06-19 MED ORDER — MORPHINE SULFATE (PF) 10 MG/ML IV SOLN: 2.0000 mg | INTRAVENOUS | Status: DC | PRN

## 2016-06-19 MED ORDER — DIPHENHYDRAMINE HCL 50 MG/ML IJ SOLN
25.0000 mg | INTRAMUSCULAR | Status: AC
  Administered 2016-06-19: 25 mg via INTRAVENOUS

## 2016-06-19 MED ORDER — FENTANYL CITRATE (PF) 100 MCG/2ML IJ SOLN
INTRAMUSCULAR | Status: DC | PRN
  Administered 2016-06-19: 25 ug via INTRAVENOUS

## 2016-06-19 MED ORDER — DOCUSATE SODIUM 100 MG PO CAPS: 100.0000 mg | ORAL_CAPSULE | Freq: Every day | ORAL | Status: DC

## 2016-06-19 MED ORDER — GUAIFENESIN-DM 100-10 MG/5ML PO SYRP: 15.0000 mL | ORAL_SOLUTION | ORAL | Status: DC | PRN

## 2016-06-19 SURGICAL SUPPLY — 10 items
CATH OMNI FLUSH 5F 65CM (CATHETERS) ×2 IMPLANT
COVER PRB 48X5XTLSCP FOLD TPE (BAG) IMPLANT
COVER PROBE 5X48 (BAG) ×2
KIT MICROINTRODUCER STIFF 5F (SHEATH) ×1 IMPLANT
KIT PV (KITS) ×2 IMPLANT
SHEATH PINNACLE 5F 10CM (SHEATH) ×2 IMPLANT
SYR MEDRAD MARK V 150ML (SYRINGE) ×2 IMPLANT
TRANSDUCER W/STOPCOCK (MISCELLANEOUS) ×2 IMPLANT
TRAY PV CATH (CUSTOM PROCEDURE TRAY) ×2 IMPLANT
WIRE BENTSON .035X145CM (WIRE) ×1 IMPLANT

## 2016-06-19 NOTE — Telephone Encounter (Signed)
Left message to make them aware of referral for cardiac clearance and also placed a referral in epic.

## 2016-06-19 NOTE — H&P (View-Only) (Signed)
[facility]  Patient name: [name] MRN: [id number] DOB: [birth year] Sex: male   REFERRING PROVIDER:    Dr. Sheppard Coil   REASON FOR CONSULT:    Bilateral heel wounds  HISTORY OF PRESENT ILLNESS:   [name] is a 61 y.o. male, who is Referred today for evaluation of bilateral heel ulcers.  He has a history of stroke with some right-sided weakness.  He has a history of congestive heart failure.  He suffers from diabetes without complication.  He takes a statin for hypercholesterolemia.  He is on Xaralto for DVT.  PAST MEDICAL HISTORY    Past Medical History:  Diagnosis Date  . Adrenal insufficiency ([place])   . Allergy   . Arthritis   . Blind left eye   . Carpal tunnel syndrome   . Cataract    bil removed  . CHF (congestive heart failure) ([place])   . Diabetes mellitus without complication ([place])   . ED (erectile dysfunction)   . Gout   . Herpes   . Hypercholesterolemia   . Hypertension   . MI (myocardial infarction) ([place])   . Nausea & vomiting   . Peripheral neuropathy   . Pneumonia   . PVD (peripheral vascular disease) ([place])      FAMILY HISTORY   Family History  Problem Relation Age of Onset  . Breast cancer Mother   . Hypertension Father     blood clot in leg  . Diabetes Maternal Aunt   . Heart attack Sister   . Colon cancer Neg Hx   . Esophageal cancer Neg Hx   . Rectal cancer Neg Hx   . Stomach cancer Neg Hx     SOCIAL HISTORY:   Social History   Social History  . Marital status: Married    Spouse name: N/A  . Number of children: N/A  . Years of education: N/A   Occupational History  . Not on file.   Social History Main Topics  . Smoking status: Never Smoker  . Smokeless tobacco: Never Used  . Alcohol use No  . Drug use: No  . Sexual activity: No   Other Topics Concern  . Not on file   Social History Narrative  . No narrative on file    ALLERGIES:    Allergies   Allergen Reactions  . Iohexol Hives  . Ivp Dye [Iodinated Diagnostic Agents] Hives    CURRENT MEDICATIONS:    Current Outpatient Prescriptions  Medication Sig Dispense Refill  . acetaminophen (TYLENOL) 160 MG/5ML suspension Take 650 mg by mouth daily as needed for mild pain. 20.3 ml ( 650 mg total)     . albuterol (PROVENTIL) (2.5 MG/3ML) 0.083% nebulizer solution Take 3 mLs (2.5 mg total) by nebulization every 2 (two) hours as needed for shortness of breath. 75 mL 12  . Amino Acids-Protein Hydrolys (FEEDING SUPPLEMENT, PRO-STAT SUGAR FREE 64,) LIQD Take 30 mLs by mouth 2 (two) times daily.    Marland Kitchen amLODipine (NORVASC) 10 MG tablet Take 1 tablet (10 mg total) by mouth daily.    Marland Kitchen atorvastatin (LIPITOR) 40 MG tablet Take 1 tablet (40 mg total) by mouth daily at 6 PM. 30 tablet 0  . bethanechol (URECHOLINE) 10 MG tablet Take 1 tablet (10 mg total) by mouth 3 (three) times daily.    . bisacodyl (DULCOLAX) 10 MG suppository Place 10 mg rectally as needed for moderate constipation.    . carvedilol (COREG) 25 MG tablet Take  1 tablet (25 mg total) by mouth 2 (two) times daily with a meal. 60 tablet 0  . cloNIDine (CATAPRES) 0.3 MG tablet Take 1 tablet (0.3 mg total) by mouth 3 (three) times daily. 60 tablet 11  . FLUoxetine (PROZAC) 10 MG capsule Take 1 capsule (10 mg total) by mouth daily.  3  . GLUCERNA (GLUCERNA) LIQD Take 237 mLs by mouth 4 (four) times daily.    . hydrocerin (EUCERIN) CREA Apply 1 application topically 2 (two) times daily.  0  . hydrocortisone (ANUSOL-HC) 2.5 % rectal cream Place rectally 2 (two) times daily as needed for hemorrhoids or itching. 30 g 0  . insulin aspart (NOVOLOG) 100 UNIT/ML FlexPen Inject into the skin 3 (three) times daily with meals. CBG with meals and SSI as follows less than 150 no insulin, 151 - 200 = 3 units, 201 - 250 = 5 units, 251 - 300 = 8 units, 301 - 350 = 11 units, 351 - 400 = 15 units, greater than 400 give 15 units and call MD    . Insulin  Glargine (TOUJEO SOLOSTAR) 300 UNIT/ML SOPN Inject 60 Units into the skin at bedtime.    . isosorbide-hydrALAZINE (BIDIL) 20-37.5 MG tablet Take 2 tablets by mouth 3 (three) times daily. 90 tablet 0  . lidocaine (XYLOCAINE) 2 % jelly Apply topically as needed (Use with in and out catheter). 30 mL 0  . Magnesium Hydroxide (MILK OF MAGNESIA PO) Take 30 mLs by mouth as needed (for constipation).    . mirtazapine (REMERON) 7.5 MG tablet Take 1 tablet (7.5 mg total) by mouth at bedtime.    . Multiple Vitamins-Minerals (MULTIVITAMIN PO) Take 1 tablet by mouth daily.    . nitroGLYCERIN (NITROSTAT) 0.4 MG SL tablet Place 1 tablet (0.4 mg total) under the tongue every 5 (five) minutes as needed for chest pain.  12  . ondansetron (ZOFRAN) 4 MG tablet Take 4 mg by mouth every 6 (six) hours as needed for nausea or vomiting.    . polyethylene glycol (MIRALAX / GLYCOLAX) packet Take 17 g by mouth daily. 14 each 0  . rivaroxaban (XARELTO) 20 MG TABS tablet Take 1 tablet (20 mg total) by mouth daily with supper. 30 tablet   . senna-docusate (SENOKOT-S) 8.6-50 MG tablet Take 1 tablet by mouth daily. 30 tablet 0  . Sodium Phosphates (RA SALINE ENEMA) 19-7 GM/118ML ENEM Place 1 each rectally as needed (for constipation).    . tamsulosin (FLOMAX) 0.4 MG CAPS capsule Take 1 capsule (0.4 mg total) by mouth daily after supper. 30 capsule   . zinc sulfate 220 (50 Zn) MG capsule Take 220 mg by mouth daily.     No current facility-administered medications for this visit.     REVIEW OF SYSTEMS:   [X]  denotes positive finding, [ ]  denotes negative finding Cardiac  Comments:  Chest pain or chest pressure:    Shortness of breath upon exertion:    Short of breath when lying flat:    Irregular heart rhythm:        Vascular    Pain in calf, thigh, or hip brought on by ambulation:    Pain in feet at night that wakes you up from your sleep:     Blood clot in your veins:    Leg swelling:         Pulmonary    Oxygen at  home:    Productive cough:     Wheezing:  Neurologic    Sudden weakness in arms or legs:  x   Sudden numbness in arms or legs:  x   Sudden onset of difficulty speaking or slurred speech:    Temporary loss of vision in one eye:     Problems with dizziness:         Gastrointestinal    Blood in stool:      Vomited blood:         Genitourinary    Burning when urinating:     Blood in urine:        Psychiatric    Major depression:         Hematologic    Bleeding problems:    Problems with blood clotting too easily:        Skin    Rashes or ulcers: x       Constitutional    Fever or chills:     PHYSICAL EXAM:   Vitals:   06/11/16 1323 06/11/16 1327  BP: (!) 144/72 (!) 142/81  Pulse: 73   Resp: 18   Temp: 98.2 F (36.8 C)   TempSrc: Oral   SpO2: 95%   Weight: 238 lb (108 kg)   Height: 5\' 9"  (1.753 m)     GENERAL: The patient is a well-nourished male, in no acute distress. The vital signs are documented above. CARDIAC: There is a regular rate and rhythm.  VASCULAR: Nonpalpable pedal pulses. PULMONARY: Nonlabored respirations MUSCULOSKELETAL: There are no major deformities or cyanosis. NEUROLOGIC: No focal weakness or paresthesias are detected. SKIN: Large left heel ulcer with dry black eschar.  Also large eschar which is dry and black on the right lateral malleolus. PSYCHIATRIC: The patient has a normal affect.  STUDIES:   I have reviewed his outside ultrasound which shows moderate to severe stenosis on the right superficial femoral artery with possible occlusion of the left superficial femoral artery  ASSESSMENT and PLAN   Bilateral heel ulcers: I discussed with the patient that there are multiple reasons for nonhealing ulcers and eschar on his leg.  The first most important is the impact of pressure that is being applied to this area.  I suspect both of these areas are pressure ulcers.  He currently does wear protective boots.  Second reason for  nonhealing wounds would be poor circulation.  His ultrasound confirms that he has a left superficial femoral artery occlusion and diffuse disease on the right.  I think the next course of action should be to proceed with angiography.  This will be done through a right femoral approach.  I'll study both legs and intervene on the left if indicated.  I have scheduled this for Tuesday, April 24.  He will stop his Jennye Moccasin prior to the procedure.   [name], MD [facility] [phone] Pager [phone]

## 2016-06-19 NOTE — Discharge Instructions (Signed)
Femoral Site Care °Refer to this sheet in the next few weeks. These instructions provide you with information about caring for yourself after your procedure. Your health care provider may also give you more specific instructions. Your treatment has been planned according to current medical practices, but problems sometimes occur. Call your health care provider if you have any problems or questions after your procedure. °What can I expect after the procedure? °After your procedure, it is typical to have the following: °· Bruising at the site that usually fades within 1-2 weeks. °· Blood collecting in the tissue (hematoma) that may be painful to the touch. It should usually decrease in size and tenderness within 1-2 weeks. °Follow these instructions at home: °· Take medicines only as directed by your health care provider. °· You may shower 24-48 hours after the procedure or as directed by your health care provider. Remove the bandage (dressing) and gently wash the site with plain soap and water. Pat the area dry with a clean towel. Do not rub the site, because this may cause bleeding. °· Do not take baths, swim, or use a hot tub until your health care provider approves. °· Check your insertion site every day for redness, swelling, or drainage. °· Do not apply powder or lotion to the site. °· Limit use of stairs to twice a day for the first 2-3 days or as directed by your health care provider. °· Do not squat for the first 2-3 days or as directed by your health care provider. °· Do not lift over 10 lb (4.5 kg) for 5 days after your procedure or as directed by your health care provider. °· Ask your health care provider when it is okay to: °¨ Return to work or school. °¨ Resume usual physical activities or sports. °¨ Resume sexual activity. °· Do not drive home if you are discharged the same day as the procedure. Have someone else drive you. °· You may drive 24 hours after the procedure unless otherwise instructed by  your health care provider. °· Do not operate machinery or power tools for 24 hours after the procedure or as directed by your health care provider. °· If your procedure was done as an outpatient procedure, which means that you went home the same day as your procedure, a responsible adult should be with you for the first 24 hours after you arrive home. °· Keep all follow-up visits as directed by your health care provider. This is important. °Contact a health care provider if: °· You have a fever. °· You have chills. °· You have increased bleeding from the site. Hold pressure on the site. °Get help right away if: °· You have unusual pain at the site. °· You have redness, warmth, or swelling at the site. °· You have drainage (other than a small amount of blood on the dressing) from the site. °· The site is bleeding, and the bleeding does not stop after 30 minutes of holding steady pressure on the site. °· Your leg or foot becomes pale, cool, tingly, or numb. °This information is not intended to replace advice given to you by your health care provider. Make sure you discuss any questions you have with your health care provider. °Document Released: 10/16/2013 Document Revised: 07/21/2015 Document Reviewed: 09/01/2013 °Elsevier Interactive Patient Education © 2017 [employer] ° °

## 2016-06-19 NOTE — Progress Notes (Signed)
Per Dutch Quint, RN at [facility], last dose of Xarelto was 06/15/16 at 1700.

## 2016-06-19 NOTE — Progress Notes (Signed)
Site area: Scientific laboratory technician Prior to Removal:  Level 0 Pressure Applied For: 6min Manual:   yes Patient Status During Pull:  A/O Post Pull Site:  Level 0 Post Pull Instructions Given:  Post instructions given and pt understands Post Pull Pulses Present: Doppler rt pt art, Rt dp venous  Dressing Applied:  Tegaderm and a 4x4 Bedrest begins @ 10:00:00 Comments: Pt leaves cath lab holding in stable condition. Rt groin is unremarkable. No hematoma or complications. Rt dressing is CDI.

## 2016-06-19 NOTE — Op Note (Signed)
    Patient name: [name] MRN: [id number] DOB: [birth year] Sex: male  06/19/2016 Pre-operative Diagnosis: bilateral heel ulcers Post-operative diagnosis:  Same Surgeon:  [name] Procedure Performed:  1.  u/s guided right femoral artery access  2.  Abdominal aortogram  3.  Bilateral lower extremity access  4.  2nd order canulation  5.  Conscious sedation ( 29 minutes)   Indications:  The patient presented with bilateral heel ulcers and ultrasound evaluation with decreased ABIs.  He is here today for aortogram  Procedure:  The patient was identified in the holding area and taken to room 8.  The patient was then placed supine on the table and prepped and draped in the usual sterile fashion.  A time out was called.  Conscious sedation was administered with disease evaluated femoral and Versed on a continuous physician and nurse monitoring.  Heart rate, blood pressure, and oxygen saturation were continuously monitored.  Ultrasound was used to evaluate the right common femoral artery.  It was patent .  A digital ultrasound image was acquired.  A micropuncture needle was used to access the right common femoral artery under ultrasound guidance.  An 018 wire was advanced without resistance and a micropuncture sheath was placed.  The 018 wire was removed and a benson wire was placed.  The micropuncture sheath was exchanged for a 5 french sheath.  An omniflush catheter was advanced over the wire to the level of L-1.  An abdominal angiogram was obtained.  Next, using the omniflush catheter and a benson wire, the aortic bifurcation was crossed and the catheter was placed into theleft external iliac artery and left runoff was obtained.  right runoff was performed via retrograde sheath injections.  Findings:   Aortogram:  Mild renal artery stenosis bilaterally.  The infrarenal abdominal aorta is patent throughout its course.  Bilateral common and external iliac arteries are widely patent.  Right  Lower Extremity:  The right common femoral profundus femoral artery are patent.  The superficial femoral artery is diffusely diseased proximally and then occludes.  There is reconstitution of posterior tibial artery near its origin which is patent across the ankle  Left Lower Extremity:  Approximately 60% stenosis within the left common femoral artery.  The profunda femoral artery is patent.  The superficial femoral artery occludes in its midportion.  He reconstitutes for a short segment and then reoccluded again.  The popliteal artery is occluded with reconstitution of the posterior tibial artery near its origin which is patent across the ankle  Intervention:  None  Impression:  #1  bilateral occlusion of the superficial femoral and popliteal artery with reconstitution of the posterior tibial artery bilaterally  #2  left common femoral stenosis  #3  the patient we brought back for consideration of femoral posterior tibial bypass graft  #4  mild-to-moderate renal stenosis bilaterally    V. [name], M.D. [facility] [place] Office: [phone] Pager:  [phone]

## 2016-06-19 NOTE — Progress Notes (Signed)
Client transferred to [facility] and rehab via [facility] and report called to [place] nurse at [facility]

## 2016-06-19 NOTE — Interval H&P Note (Signed)
History and Physical Interval Note:  06/19/2016 8:22 AM  [name]  has presented today for surgery, with the diagnosis of pvd  The various methods of treatment have been discussed with the patient and family. After consideration of risks, benefits and other options for treatment, the patient has consented to  Procedure(s): Abdominal Aortogram w/Lower Extremity (N/A) as a surgical intervention .  The patient's history has been reviewed, patient examined, no change in status, stable for surgery.  I have reviewed the patient's chart and labs.  Questions were answered to the patient's satisfaction.     [name]

## 2016-06-19 NOTE — Progress Notes (Signed)
Spoke with pt's daughter and Chauncey Reading Marlyne Beards, who gave consent for procedure with Dr. Trula Slade, contact # [phone].

## 2016-06-21 ENCOUNTER — Encounter (HOSPITAL_BASED_OUTPATIENT_CLINIC_OR_DEPARTMENT_OTHER): Payer: No Typology Code available for payment source | Attending: Internal Medicine

## 2016-06-21 DIAGNOSIS — L97419 Non-pressure chronic ulcer of right heel and midfoot with unspecified severity: Secondary | ICD-10-CM | POA: Diagnosis not present

## 2016-06-21 DIAGNOSIS — I252 Old myocardial infarction: Secondary | ICD-10-CM | POA: Insufficient documentation

## 2016-06-21 DIAGNOSIS — I69351 Hemiplegia and hemiparesis following cerebral infarction affecting right dominant side: Secondary | ICD-10-CM | POA: Insufficient documentation

## 2016-06-21 DIAGNOSIS — E1140 Type 2 diabetes mellitus with diabetic neuropathy, unspecified: Secondary | ICD-10-CM | POA: Insufficient documentation

## 2016-06-21 DIAGNOSIS — S91001A Unspecified open wound, right ankle, initial encounter: Secondary | ICD-10-CM | POA: Diagnosis not present

## 2016-06-21 DIAGNOSIS — S91302A Unspecified open wound, left foot, initial encounter: Secondary | ICD-10-CM | POA: Diagnosis not present

## 2016-06-21 DIAGNOSIS — I129 Hypertensive chronic kidney disease with stage 1 through stage 4 chronic kidney disease, or unspecified chronic kidney disease: Secondary | ICD-10-CM | POA: Insufficient documentation

## 2016-06-21 DIAGNOSIS — E11621 Type 2 diabetes mellitus with foot ulcer: Secondary | ICD-10-CM | POA: Insufficient documentation

## 2016-06-21 DIAGNOSIS — Z86718 Personal history of other venous thrombosis and embolism: Secondary | ICD-10-CM | POA: Diagnosis not present

## 2016-06-21 DIAGNOSIS — L97429 Non-pressure chronic ulcer of left heel and midfoot with unspecified severity: Secondary | ICD-10-CM | POA: Insufficient documentation

## 2016-06-21 DIAGNOSIS — L97319 Non-pressure chronic ulcer of right ankle with unspecified severity: Secondary | ICD-10-CM | POA: Diagnosis not present

## 2016-06-21 DIAGNOSIS — I2510 Atherosclerotic heart disease of native coronary artery without angina pectoris: Secondary | ICD-10-CM | POA: Diagnosis not present

## 2016-06-21 DIAGNOSIS — Z794 Long term (current) use of insulin: Secondary | ICD-10-CM | POA: Insufficient documentation

## 2016-06-21 DIAGNOSIS — S91301A Unspecified open wound, right foot, initial encounter: Secondary | ICD-10-CM | POA: Diagnosis not present

## 2016-06-21 DIAGNOSIS — E1122 Type 2 diabetes mellitus with diabetic chronic kidney disease: Secondary | ICD-10-CM | POA: Insufficient documentation

## 2016-06-21 DIAGNOSIS — N183 Chronic kidney disease, stage 3 (moderate): Secondary | ICD-10-CM | POA: Insufficient documentation

## 2016-06-21 DIAGNOSIS — Z6834 Body mass index (BMI) 34.0-34.9, adult: Secondary | ICD-10-CM | POA: Insufficient documentation

## 2016-06-21 DIAGNOSIS — Z7901 Long term (current) use of anticoagulants: Secondary | ICD-10-CM | POA: Insufficient documentation

## 2016-06-21 DIAGNOSIS — H5440 Blindness, one eye, unspecified eye: Secondary | ICD-10-CM | POA: Insufficient documentation

## 2016-06-22 ENCOUNTER — Telehealth: Payer: Self-pay | Admitting: Surgery

## 2016-06-22 ENCOUNTER — Non-Acute Institutional Stay (SKILLED_NURSING_FACILITY): Payer: Medicare Other | Admitting: Internal Medicine

## 2016-06-22 ENCOUNTER — Encounter: Payer: Self-pay | Admitting: Internal Medicine

## 2016-06-22 DIAGNOSIS — I2510 Atherosclerotic heart disease of native coronary artery without angina pectoris: Secondary | ICD-10-CM | POA: Diagnosis not present

## 2016-06-22 DIAGNOSIS — I825Y9 Chronic embolism and thrombosis of unspecified deep veins of unspecified proximal lower extremity: Secondary | ICD-10-CM

## 2016-06-22 DIAGNOSIS — I5022 Chronic systolic (congestive) heart failure: Secondary | ICD-10-CM

## 2016-06-22 NOTE — Telephone Encounter (Signed)
Scheduled labs for 5/29 and office visit on 6/4

## 2016-06-22 NOTE — Telephone Encounter (Signed)
-----   Message from Mena Goes, RN sent at 06/19/2016 10:57 AM EDT ----- Regarding: 2-3 weeks w/ lab   ----- Message ----- From: Serafina Mitchell, MD Sent: 06/19/2016   9:16 AM To: V[facility] Charge Pool  06/19/2016:  Surgeon:  [name] Procedure Performed:  1.  u/s guided right femoral artery access  2.  Abdominal aortogram  3.  Bilateral lower extremity access  4.  2nd order canulation  5.  Conscious sedation ( 29 minutes)    Back to the office in 2-3 weeks with lower extremity vein mapping to discuss bypass for a heel ulcer bilaterally

## 2016-06-22 NOTE — Progress Notes (Signed)
Location:  [facility] Room Number: 110P Place of Service:  SNF (31)  [name]. James Coil, MD  Patient Care Team: Maury Dus, MD as PCP - General [facility] Medicine)  Extended Emergency Contact Information Primary Emergency Contact: Terry,Nicole Address: [address]          [place], [place] [address] Johnnette Litter of [place] Phone: [phone] Mobile Phone: [phone] Relation: Daughter Secondary Emergency Contact: Lizabeth Le[place] of [place] Phone: [phone] Mobile Phone: [phone] Relation: Daughter    Allergies: Iohexol and Ivp dye [iodinated diagnostic agents]  Chief Complaint  Patient presents with  . Medical Management of Chronic Issues    Routine Visit    HPI: Patient is 61 y.o. male who is being seen for routine issues of CHF, CAD and h/o DVT.  Past Medical History:  Diagnosis Date  . Adrenal insufficiency ([place])   . Allergy   . Arthritis   . Blind left eye   . Carpal tunnel syndrome   . Cataract    bil removed  . CHF (congestive heart failure) ([place])   . Diabetes mellitus without complication ([place])   . ED (erectile dysfunction)   . Gout   . Herpes   . Hypercholesterolemia   . Hypertension   . MI (myocardial infarction) ([place])   . Nausea & vomiting   . Peripheral neuropathy   . Pneumonia   . PVD (peripheral vascular disease) ([place])     Past Surgical History:  Procedure Laterality Date  . ABDOMINAL AORTOGRAM W/LOWER EXTREMITY N/A 06/19/2016   Procedure: Abdominal Aortogram w/Lower Extremity;  Surgeon: Serafina Mitchell, MD;  Location: [facility];  Service: Cardiovascular;  Laterality: N/A;  . CHOLECYSTECTOMY    . CORONARY ANGIOPLASTY WITH STENT PLACEMENT  2007   two 2.5  x 13 mm Cypher stents to the RCA  . dilatera cataracts removed    . right knee surgary      Allergies as of 06/22/2016      Reactions   Iohexol Hives   Ivp Dye [iodinated Diagnostic Agents] Hives        Medication List       Accurate as of 06/22/16 11:59 PM. Always use your most recent med list.          acetaminophen 160 MG/5ML suspension Commonly known as:  TYLENOL Take 650 mg by mouth as needed for mild pain. 20.3 ml ( 650 mg total)   albuterol (2.5 MG/3ML) 0.083% nebulizer solution Commonly known as:  PROVENTIL Take 3 mLs (2.5 mg total) by nebulization every 2 (two) hours as needed for shortness of breath.   amLODipine 10 MG tablet Commonly known as:  NORVASC Take 1 tablet (10 mg total) by mouth daily.   atorvastatin 40 MG tablet Commonly known as:  LIPITOR Take 1 tablet (40 mg total) by mouth daily at 6 PM.   bethanechol 10 MG tablet Commonly known as:  URECHOLINE Take 1 tablet (10 mg total) by mouth 3 (three) times daily.   carvedilol 25 MG tablet Commonly known as:  COREG Take 1 tablet (25 mg total) by mouth 2 (two) times daily with a meal.   cloNIDine 0.3 MG tablet Commonly known as:  CATAPRES Take 1 tablet (0.3 mg total) by mouth 3 (three) times daily.   feeding supplement (PRO-STAT SUGAR FREE 64) Liqd Take 30 mLs by mouth 2 (two) times daily.   FLUoxetine 10 MG capsule Commonly known as:  PROZAC Take 1 capsule (  10 mg total) by mouth daily.   furosemide 40 MG tablet Commonly known as:  LASIX Take 40 mg by mouth daily as needed for edema.   GLUCERNA Liqd Take 237 mLs by mouth 4 (four) times daily.   hydrocerin Crea Apply 1 application topically 2 (two) times daily.   hydrocortisone 2.5 % rectal cream Commonly known as:  ANUSOL-HC Place rectally 2 (two) times daily as needed for hemorrhoids or itching.   insulin aspart 100 UNIT/ML FlexPen Commonly known as:  NOVOLOG Inject into the skin 3 (three) times daily with meals. CBG with meals and SSI as follows less than 150 no insulin, 151 - 200 = 3 units, 201 - 250 = 5 units, 251 - 300 = 8 units, 301 - 350 = 11 units, 351 - 400 = 15 units, greater than 400 give 15 units and call MD    isosorbide-hydrALAZINE 20-37.5 MG tablet Commonly known as:  BIDIL Take 2 tablets by mouth 3 (three) times daily.   lidocaine 2 % jelly Commonly known as:  XYLOCAINE Apply topically as needed (Use with in and out catheter).   mirtazapine 7.5 MG tablet Commonly known as:  REMERON Take 1 tablet (7.5 mg total) by mouth at bedtime.   MULTIVITAMIN PO Take 1 tablet by mouth daily.   nitroGLYCERIN 0.4 MG SL tablet Commonly known as:  NITROSTAT Place 1 tablet (0.4 mg total) under the tongue every 5 (five) minutes as needed for chest pain.   ondansetron 4 MG disintegrating tablet Commonly known as:  ZOFRAN-ODT Take 4 mg by mouth every 8 (eight) hours as needed for nausea or vomiting.   polyethylene glycol packet Commonly known as:  MIRALAX / GLYCOLAX Take 17 g by mouth daily.   RA SALINE ENEMA 19-7 GM/118ML Enem Place 1 each rectally as needed (for constipation).   rivaroxaban 20 MG Tabs tablet Commonly known as:  XARELTO Take 1 tablet (20 mg total) by mouth daily with supper.   senna-docusate 8.6-50 MG tablet Commonly known as:  Senokot-S Take 1 tablet by mouth daily.   tamsulosin 0.4 MG Caps capsule Commonly known as:  FLOMAX Take 1 capsule (0.4 mg total) by mouth daily after supper.   TOUJEO SOLOSTAR 300 UNIT/ML Sopn Generic drug:  Insulin Glargine Inject 60 Units into the skin at bedtime.   zinc sulfate 220 (50 Zn) MG capsule Take 220 mg by mouth daily.       No orders of the defined types were placed in this encounter.   Immunization History  Administered Date(s) Administered  . PPD Test 04/25/2016    Social History  Substance Use Topics  . Smoking status: Never Smoker  . Smokeless tobacco: Never Used  . Alcohol use No    Review of Systems  DATA OBTAINED: from patient- can participate very little;nurse- no new concerns GENERAL:  no fevers, fatigue, appetite changes SKIN: No itching, rash HEENT: No complaint RESPIRATORY: No cough, wheezing,  SOB CARDIAC: No chest pain, palpitations, lower extremity edema  GI: No abdominal pain, No N/V/D or constipation, No heartburn or reflux  GU: No dysuria, frequency or urgency, or incontinence  MUSCULOSKELETAL: No unrelieved bone/joint pain NEUROLOGIC: No headache, dizziness  PSYCHIATRIC: No overt anxiety or sadness  Vitals:   06/22/16 0842  BP: (!) 154/81  Pulse: 85  Resp: 20  Temp: 98 F (36.7 C)   Body mass index is 35.15 kg/m. Physical Exam  GENERAL APPEARANCE: Alert, min conversant, No acute distress  SKIN: No diaphoresis rash HEENT: Unremarkable  RESPIRATORY: Breathing is even, unlabored. Lung sounds are clear   CARDIOVASCULAR: Heart RRR no murmurs, rubs or gallops. No peripheral edema  GASTROINTESTINAL: Abdomen is soft, non-tender, not distended w/ normal bowel sounds.  GENITOURINARY: Bladder non tender, not distended  MUSCULOSKELETAL: No abnormal joints or musculature NEUROLOGIC: Cranial nerves 2-12 grossly intact. Moves all extremities PSYCHIATRIC: Mood and affect with dementia, no behavioral issues  Patient Active Problem List   Diagnosis Date Noted  . Acute respiratory failure with hypoxia ([place]) 05/15/2016  . HLD (hyperlipidemia) 05/15/2016  . Nausea and vomiting   . Hypoxia 05/10/2016  . Elevated troponin 05/10/2016  . AKI (acute kidney injury) ([place]) 05/10/2016  . Pressure injury of skin 05/10/2016  . CKD (chronic kidney disease) stage 3, GFR 30-59 ml/min 04/28/2016  . Hypernatremia 04/28/2016  . Dysphagia due to recent stroke 04/28/2016  . Cystitis due to Pseudomonas 04/28/2016  . Enterococcus UTI 04/28/2016  . Hyperlipidemia associated with type 2 diabetes mellitus ([place]) 04/28/2016  . Depression 04/28/2016  . Urinary retention   . Accidental fall from chair   . FUO (fever of unknown origin)   . Cognitive and behavioral changes   . Cerebrovascular accident (CVA) due to embolism of left posterior cerebral artery ([place])   . Lethargy   . Acute on chronic  renal insufficiency   . Low back pain   . Leukocytosis   . Labile blood glucose   . Hypoglycemia due to insulin   . UTI due to Klebsiella species   . Deep tissue injury   . Renovascular hypertension   . History of DVT (deep vein thrombosis)   . Coronary artery disease involving native coronary artery of native heart without angina pectoris   . Morbid obesity ([place])   . Stage 2 chronic kidney disease   . Hypokalemia   . Acute blood loss anemia   . Embolic stroke involving posterior cerebral artery ([place]) 04/03/2016  . Brainstem stroke ([place]) 04/01/2016  . Abnormality of gait and mobility   . Cerebral infarction due to thrombosis of other cerebral artery ([place])   . Right arm numbness   . Left sided numbness 03/29/2016  . TIA (transient ischemic attack) 03/29/2016  . Hypertensive urgency 03/29/2016  . Sepsis ([place]) 09/10/2013  . DVT (deep venous thrombosis) ([place]) 01/01/2013  . Rash and nonspecific skin eruption 12/29/2012  . Cellulitis and abscess of leg 12/29/2012  . Diabetes type 2, uncontrolled ([place]) 12/29/2012  . Hypertension   . Cardiomyopathy ([place])   . CHF (congestive heart failure) ([place])   . Peripheral neuropathy   . Special screening for malignant neoplasms, colon 12/09/2012    CMP     Component Value Date/Time   NA 140 06/19/2016 0753   NA 140 06/19/2016   K 3.7 06/19/2016 0753   CL 103 06/19/2016 0753   CO2 25 06/05/2016 1227   GLUCOSE 107 (H) 06/19/2016 0753   BUN 12 06/19/2016 0753   BUN 12 06/19/2016   CREATININE 0.90 06/19/2016 0753   CREATININE 1.08 06/05/2016 1227   CALCIUM 7.9 (L) 06/05/2016 1227   PROT 8.1 05/10/2016 1027   ALBUMIN 2.7 (L) 05/10/2016 1027   AST 131 (H) 05/10/2016 1027   ALT 138 (H) 05/10/2016 1027   ALKPHOS 70 05/10/2016 1027   BILITOT 0.5 05/10/2016 1027   GFRNONAA 35 (L) 05/12/2016 0446   GFRAA 41 (L) 05/12/2016 0446    Recent Labs  04/01/16 0221  05/10/16 1027  05/12/16 0446 05/14/16 06/05/16 1227 06/19/16 06/19/16 0753   NA  139  < > 141  < > 143 149* 141 140 140  K 3.5  < > 3.8  --  3.8 4.6 4.4  --  3.7  CL 105  < > 104  --  109  --  107  --  103  CO2 26  < > 27  --  27  --  25  --   --   GLUCOSE 240*  < > 148*  --  240*  --  145*  --  107*  BUN 18  < > 65*  < > 57* 54* 15 12 12   CREATININE 1.49*  < > 2.05*  < > 1.98* 1.8* 1.08 0.9 0.90  CALCIUM 9.0  < > 8.6*  --  8.4*  --  7.9*  --   --   MG 1.9  --   --   --   --   --   --   --   --   < > = values in this interval not displayed.  Recent Labs  03/28/16 2113 04/04/16 0516 05/10/16 1027  AST 24 17 131*  ALT 17 12* 138*  ALKPHOS 61 43 70  BILITOT 0.6 0.5 0.5  PROT 7.7 6.9 8.1  ALBUMIN 3.8 2.9* 2.7*    Recent Labs  04/18/16 0441  04/23/16 1151  05/10/16 1027 05/12/16 05/12/16 0446 06/05/16 1227 06/19/16 0753  WBC 8.4  < > 6.7  < > 10.5 13.5 13.5* 7.0  --   NEUTROABS 4.9  --  4.7  --  7.4  --   --   --   --   HGB 12.6*  --  13.7  < > 12.6*  --  12.1* 10.4* 10.9*  HCT 40.0  --  43.7  < > 39.1  --  38.4* 31.8* 32.0*  MCV 90.9  --  92.4  --  89.9  --  91.6 86.2  --   PLT 344  --  237  < > 253  --  271 274  --   < > = values in this interval not displayed.  Recent Labs  03/29/16 0531  CHOL 235*  LDLCALC 167*  TRIG 160*   No results found for: MICROALBUR No results found for: TSH Lab Results  Component Value Date   HGBA1C 11.0 (H) 03/29/2016   Lab Results  Component Value Date   CHOL 235 (H) 03/29/2016   HDL 36 (L) 03/29/2016   LDLCALC 167 (H) 03/29/2016   TRIG 160 (H) 03/29/2016   CHOLHDL 6.5 03/29/2016    Significant Diagnostic Results in last 30 days:  No results found.  Assessment and Plan  CHF (congestive heart failure) (HCC) Chronic and stable' plan to cont coreg 25 mg BID and lasix 40 mg daily  Coronary artery disease involving native coronary artery of native heart without angina pectoris No reports of CP or equivalents; plan to cont Bidil 20- 37.5, 2 po TID, coreg 25 mg BID, lipitor 40 mg daily and NTG SL  prn  DVT (deep venous thrombosis) (HCC) Has been on xarelto 20 mg daily;plan cont for 2 more months for total 6 months    Sharlie Shreffler D. James Coil, MD

## 2016-06-25 DIAGNOSIS — I70233 Atherosclerosis of native arteries of right leg with ulceration of ankle: Secondary | ICD-10-CM | POA: Diagnosis not present

## 2016-06-25 DIAGNOSIS — I70244 Atherosclerosis of native arteries of left leg with ulceration of heel and midfoot: Secondary | ICD-10-CM | POA: Diagnosis not present

## 2016-06-25 DIAGNOSIS — I70234 Atherosclerosis of native arteries of right leg with ulceration of heel and midfoot: Secondary | ICD-10-CM | POA: Diagnosis not present

## 2016-06-27 ENCOUNTER — Other Ambulatory Visit: Payer: Self-pay | Admitting: *Deleted

## 2016-06-27 NOTE — Patient Outreach (Signed)
[facility] ([facility] Care Management  06/27/2016  Karlin C Perrone 08/03/1955 [id number]   Met with Chelsea, SW at facility, she confirmed patient had readmitted to hospital and had returned to facility. She states the plan is to transition to LTC at facility.   Plan to sign off as no [facility] community care management needs at this time.  E. , RN, BSN, CCM  Post Acute Care Coordinator [facility] ([phone]) Business Cell  ([phone]) Toll Free Office 

## 2016-07-02 DIAGNOSIS — F329 Major depressive disorder, single episode, unspecified: Secondary | ICD-10-CM | POA: Diagnosis not present

## 2016-07-02 DIAGNOSIS — I70244 Atherosclerosis of native arteries of left leg with ulceration of heel and midfoot: Secondary | ICD-10-CM | POA: Diagnosis not present

## 2016-07-02 DIAGNOSIS — I70233 Atherosclerosis of native arteries of right leg with ulceration of ankle: Secondary | ICD-10-CM | POA: Diagnosis not present

## 2016-07-02 DIAGNOSIS — F4320 Adjustment disorder, unspecified: Secondary | ICD-10-CM | POA: Diagnosis not present

## 2016-07-02 DIAGNOSIS — I70234 Atherosclerosis of native arteries of right leg with ulceration of heel and midfoot: Secondary | ICD-10-CM | POA: Diagnosis not present

## 2016-07-02 DIAGNOSIS — F419 Anxiety disorder, unspecified: Secondary | ICD-10-CM | POA: Diagnosis not present

## 2016-07-09 DIAGNOSIS — I70233 Atherosclerosis of native arteries of right leg with ulceration of ankle: Secondary | ICD-10-CM | POA: Diagnosis not present

## 2016-07-09 DIAGNOSIS — I70244 Atherosclerosis of native arteries of left leg with ulceration of heel and midfoot: Secondary | ICD-10-CM | POA: Diagnosis not present

## 2016-07-09 DIAGNOSIS — I70234 Atherosclerosis of native arteries of right leg with ulceration of heel and midfoot: Secondary | ICD-10-CM | POA: Diagnosis not present

## 2016-07-13 ENCOUNTER — Other Ambulatory Visit: Payer: Self-pay

## 2016-07-16 DIAGNOSIS — I70244 Atherosclerosis of native arteries of left leg with ulceration of heel and midfoot: Secondary | ICD-10-CM | POA: Diagnosis not present

## 2016-07-16 DIAGNOSIS — I70233 Atherosclerosis of native arteries of right leg with ulceration of ankle: Secondary | ICD-10-CM | POA: Diagnosis not present

## 2016-07-16 DIAGNOSIS — I70234 Atherosclerosis of native arteries of right leg with ulceration of heel and midfoot: Secondary | ICD-10-CM | POA: Diagnosis not present

## 2016-07-16 NOTE — Addendum Note (Signed)
Addended by: Lianne Cure A on: 07/16/2016 10:15 AM   Modules accepted: Orders

## 2016-07-18 ENCOUNTER — Encounter: Payer: Self-pay | Admitting: Surgery

## 2016-07-22 ENCOUNTER — Encounter: Payer: Self-pay | Admitting: Internal Medicine

## 2016-07-22 NOTE — Assessment & Plan Note (Signed)
Has been on xarelto 20 mg daily;plan cont for 2 more months for total 6 months

## 2016-07-22 NOTE — Assessment & Plan Note (Signed)
Chronic and stable' plan to cont coreg 25 mg BID and lasix 40 mg daily

## 2016-07-22 NOTE — Assessment & Plan Note (Signed)
No reports of CP or equivalents; plan to cont Bidil 20- 37.5, 2 po TID, coreg 25 mg BID, lipitor 40 mg daily and NTG SL prn

## 2016-07-23 DIAGNOSIS — I70244 Atherosclerosis of native arteries of left leg with ulceration of heel and midfoot: Secondary | ICD-10-CM | POA: Diagnosis not present

## 2016-07-23 DIAGNOSIS — I70233 Atherosclerosis of native arteries of right leg with ulceration of ankle: Secondary | ICD-10-CM | POA: Diagnosis not present

## 2016-07-23 DIAGNOSIS — I70234 Atherosclerosis of native arteries of right leg with ulceration of heel and midfoot: Secondary | ICD-10-CM | POA: Diagnosis not present

## 2016-07-24 ENCOUNTER — Encounter (HOSPITAL_COMMUNITY): Payer: Medicare Other

## 2016-07-25 ENCOUNTER — Non-Acute Institutional Stay (SKILLED_NURSING_FACILITY): Payer: Medicare Other | Admitting: Internal Medicine

## 2016-07-25 ENCOUNTER — Encounter: Payer: Self-pay | Admitting: Internal Medicine

## 2016-07-25 DIAGNOSIS — I63432 Cerebral infarction due to embolism of left posterior cerebral artery: Secondary | ICD-10-CM | POA: Diagnosis not present

## 2016-07-25 DIAGNOSIS — F329 Major depressive disorder, single episode, unspecified: Secondary | ICD-10-CM

## 2016-07-25 DIAGNOSIS — E785 Hyperlipidemia, unspecified: Secondary | ICD-10-CM | POA: Diagnosis not present

## 2016-07-25 NOTE — Progress Notes (Signed)
Location:  [place] Room Number: 110P Place of Service:  SNF (31)  James Delaine. Sheppard Coil, MD  No care team member to display  Extended Emergency Contact Information Primary Emergency Contact: Terry,Nicole Address: [address]          [place], [place] [address] [place] Phone: [phone] Mobile Phone: [phone] Relation: Daughter Secondary Emergency Contact: Lizabeth [place] Phone: [phone] Mobile Phone: [phone] Relation: Daughter    Allergies: Iohexol and Ivp dye [iodinated diagnostic agents]  Chief Complaint  Patient presents with  . Medical Management of Chronic Issues    Routine Visit    HPI: Patient is 61 y.o. male who   Past Medical History:  Diagnosis Date  . Adrenal insufficiency ([place])   . Allergy   . Arthritis   . Blind left eye   . Carpal tunnel syndrome   . Cataract    bil removed  . CHF (congestive heart failure) ([place])   . Diabetes mellitus without complication ([place])   . ED (erectile dysfunction)   . Gout   . Herpes   . Hypercholesterolemia   . Hypertension   . MI (myocardial infarction) ([place])   . Nausea & vomiting   . Peripheral neuropathy   . Pneumonia   . PVD (peripheral vascular disease) ([place])     Past Surgical History:  Procedure Laterality Date  . ABDOMINAL AORTOGRAM W/LOWER EXTREMITY N/A 06/19/2016   Procedure: Abdominal Aortogram w/Lower Extremity;  Surgeon: Serafina Mitchell, MD;  Location:  [facility];  Service: Cardiovascular;  Laterality: N/A;  . AMPUTATION Bilateral 08/16/2016   Procedure: BILATERAL AMPUTATION ABOVE KNEE;  Surgeon: Serafina Mitchell, MD;  Location: [facility];  Service: Vascular;  Laterality: Bilateral;  . CHOLECYSTECTOMY    . CORONARY ANGIOPLASTY WITH STENT PLACEMENT  2007   two 2.5  x 13 mm Cypher stents to the RCA  . dilatera cataracts removed    . right knee surgary      Allergies as of 07/25/2016      Reactions   Iohexol Hives   Ivp Dye [iodinated Diagnostic Agents] Hives      Medication List       Accurate as of 07/25/16 11:59 PM. Always use your most recent med list.          acetaminophen 160 MG/5ML suspension Commonly known as:  TYLENOL Take 650 mg by mouth as needed for mild pain. 20.3 ml ( 650 mg total)   albuterol (2.5 MG/3ML) 0.083% nebulizer solution Commonly known as:  PROVENTIL Take 3 mLs (2.5 mg total) by nebulization every 2 (two) hours as needed for shortness of breath.   amLODipine 10 MG tablet Commonly known as:  NORVASC Take 1 tablet (10 mg total) by mouth daily.   atorvastatin 40 MG tablet Commonly known as:  LIPITOR Take 1 tablet (40 mg total) by mouth daily at 6 PM.   bethanechol 10 MG tablet Commonly known as:  URECHOLINE Take 1 tablet (10 mg total) by mouth 3 (three) times daily.   carvedilol 25 MG tablet Commonly known as:  COREG Take 1 tablet (25 mg total) by mouth 2 (two) times daily with a meal.   cloNIDine 0.3 MG tablet Commonly known as:  CATAPRES Take 1 tablet (0.3 mg total) by mouth 3 (three) times daily.   feeding supplement (PRO-STAT SUGAR FREE 64) Liqd Take 30 mLs by mouth 2 (two) times daily.   FLUoxetine 10 MG  capsule Commonly known as:  PROZAC Take 1 capsule (10 mg total) by mouth daily.   furosemide 40 MG tablet Commonly known as:  LASIX Take 40 mg by mouth daily as needed for edema.   GLUCERNA Liqd Take 237 mLs by mouth 4 (four) times daily.   hydrocerin Crea Apply 1 application topically 2 (two) times daily.   hydrocortisone 2.5 % rectal cream Commonly known as:  ANUSOL-HC Place rectally 2 (two) times daily as needed for hemorrhoids or itching.   insulin aspart 100 UNIT/ML FlexPen Commonly known as:  NOVOLOG Inject into the skin 3 (three) times daily with meals. CBG with meals and SSI as follows less than 150 no insulin, 151 - 200 = 3 units, 201 - 250 = 5 units, 251 - 300 = 8 units, 301 - 350 = 11 units, 351 - 400 = 15 units,  greater than 400 give 15 units and call MD   isosorbide-hydrALAZINE 20-37.5 MG tablet Commonly known as:  BIDIL Take 2 tablets by mouth 3 (three) times daily.   lidocaine 2 % jelly Commonly known as:  XYLOCAINE Apply topically as needed (Use with in and out catheter).   mirtazapine 7.5 MG tablet Commonly known as:  REMERON Take 1 tablet (7.5 mg total) by mouth at bedtime.   MULTIVITAMIN PO Take 1 tablet by mouth daily.   nitroGLYCERIN 0.4 MG SL tablet Commonly known as:  NITROSTAT Place 1 tablet (0.4 mg total) under the tongue every 5 (five) minutes as needed for chest pain.   ondansetron 4 MG disintegrating tablet Commonly known as:  ZOFRAN-ODT Take 4 mg by mouth every 6 (six) hours as needed for nausea or vomiting.   polyethylene glycol packet Commonly known as:  MIRALAX / GLYCOLAX Take 17 g by mouth daily.   RA SALINE ENEMA 19-7 GM/118ML Enem Place 1 each rectally as needed (for constipation).   rivaroxaban 20 MG Tabs tablet Commonly known as:  XARELTO Take 1 tablet (20 mg total) by mouth daily with supper.   senna-docusate 8.6-50 MG tablet Commonly known as:  Senokot-S Take 1 tablet by mouth daily.   tamsulosin 0.4 MG Caps capsule Commonly known as:  FLOMAX Take 1 capsule (0.4 mg total) by mouth daily after supper.   TOUJEO SOLOSTAR 300 UNIT/ML Sopn Generic drug:  Insulin Glargine Inject 60 Units into the skin at bedtime.   zinc sulfate 220 (50 Zn) MG capsule Take 220 mg by mouth daily.       No orders of the defined types were placed in this encounter.   Immunization History  Administered Date(s) Administered  . PPD Test 04/25/2016    Social History  Substance Use Topics  . Smoking status: Never Smoker  . Smokeless tobacco: Never Used  . 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 Physical Exam  GENERAL APPEARANCE: Alert No acute  distress  SKIN: No diaphoresis rash HEENT: Unremarkable RESPIRATORY: Breathing is even, unlabored. Lung sounds are clear   CARDIOVASCULAR: Heart RRR no murmurs, rubs or gallops. No peripheral edema  GASTROINTESTINAL: Abdomen is soft, non-tender, not distended w/ normal bowel sounds.  GENITOURINARY: Bladder non tender, not distended  MUSCULOSKELETAL: No abnormal joints or musculature NEUROLOGIC: Cranial nerves 2-12 grossly intact. Moves all extremities PSYCHIATRIC: Mood and affect with dementia, no behavioral issues  Patient Active Problem List   Diagnosis Date Noted  . Fever   . Acute on chronic alteration in mental status   . Status post bilateral above knee amputation ([place])   . Goals of care, counseling/discussion   . Palliative care by specialist   . Enterococcal bacteremia 08/13/2016  . Bacteremia due to Escherichia coli 08/13/2016  . Proteus infection 08/13/2016  . Osteomyelitis of left foot ([place]) 08/13/2016  . History of CVA (cerebrovascular accident) 08/12/2016  . Osteomyelitis of right foot ([place]) 08/12/2016  . HLD (hyperlipidemia) 05/15/2016  . AKI (acute kidney injury) ([place]) 05/10/2016  . Pressure injury of skin 05/10/2016  . Dysphagia due to recent stroke 04/28/2016  . Hyperlipidemia associated with type 2 diabetes mellitus ([place]) 04/28/2016  . Depression 04/28/2016  . Urinary retention   . Cognitive and behavioral changes    . Cerebrovascular accident (CVA) due to embolism of left posterior cerebral artery ([place])   . Renovascular hypertension   . History of DVT (deep vein thrombosis)   . Coronary artery disease involving native coronary artery of native heart without angina pectoris   . Morbid obesity ([place])   . Normocytic anemia   . Embolic stroke involving posterior cerebral artery ([place]) 04/03/2016  . Brainstem stroke ([place]) 04/01/2016  . Abnormality of gait and mobility   . Cerebral infarction due to thrombosis of other cerebral artery ([place])   . Right arm numbness   . Left sided numbness 03/29/2016  . TIA (transient ischemic attack) 03/29/2016  . Hypertensive urgency 03/29/2016  . Sepsis ([place]) 09/10/2013  . Personal history of DVT (deep vein thrombosis) 01/01/2013  . Diabetes type 2, uncontrolled ([place]) 12/29/2012  . Hypertension   . Cardiomyopathy ([place])   . Chronic diastolic CHF (congestive heart failure) ([place])   . Peripheral neuropathy   . Special screening for malignant neoplasms, colon 12/09/2012    CMP     Component Value Date/Time   NA 141 08/26/2016 0206   NA 140 06/19/2016   K 4.0 08/26/2016 0206   CL 108 08/26/2016 0206   CO2 27 08/26/2016 0206   GLUCOSE 129 (H) 08/26/2016 0206   BUN 7 08/26/2016 0206   BUN 12 06/19/2016   CREATININE 1.20 08/26/2016 0206   CREATININE 1.08 06/05/2016 1227   CALCIUM 8.1 (L) 08/26/2016 0206   PROT 7.5 08/12/2016 1724   ALBUMIN 1.8 (L) 08/24/2016 0209   AST 16 08/12/2016 1724   ALT 17 08/12/2016 1724   ALKPHOS 75 08/12/2016 1724   BILITOT 0.3 08/12/2016 1724   GFRNONAA >60 08/26/2016 0206   GFRAA >60 08/26/2016 0206    Recent Labs  04/01/16 0221  08/24/16 0209 08/25/16 0140 08/26/16 0206  NA 139  < > 140 143 141  K 3.5  < > 3.8 3.5 4.0  CL 105  < > 106 109 108  CO2 26  < > 27 26 27   GLUCOSE 240*  < > 195* 133* 129*  BUN 18  < > 5* 6 7  CREATININE 1.49*  < > 1.20 1.22 1.20  CALCIUM 9.0  < >  7.7* 7.9* 8.1*  MG 1.9  --   --   --   --    PHOS  --   --  3.6  --   --   < > = values in this interval not displayed.  Recent Labs  04/04/16 0516 05/10/16 1027 08/12/16 1724 08/24/16 0209  AST 17 131* 16  --   ALT 12* 138* 17  --   ALKPHOS 43 70 75  --   BILITOT 0.5 0.5 0.3  --   PROT 6.9 8.1 7.5  --   ALBUMIN 2.9* 2.7* 2.5* 1.8*    Recent Labs  05/10/16 1027  08/12/16 1724 08/13/16 0434  08/22/16 0551 08/23/16 0243 08/24/16 0209  WBC 10.5  < > 8.7 9.6  < > 10.2 9.0 10.5  NEUTROABS 7.4  --  6.9 6.6  --   --   --   --   HGB 12.6*  < > 9.8* 9.3*  < > 8.6* 8.3* 8.5*  HCT 39.1  < > 30.5* 29.5*  < > 28.1* 26.9* 27.3*  MCV 89.9  < > 84.5 83.6  < > 89.5 87.9 87.5  PLT 253  < > 263 240  < > 436* 451* 477*  < > = values in this interval not displayed.  Recent Labs  03/29/16 0531  CHOL 235*  LDLCALC 167*  TRIG 160*   No results found for: MICROALBUR No results found for: TSH Lab Results  Component Value Date   HGBA1C 8.3 (H) 08/15/2016   Lab Results  Component Value Date   CHOL 235 (H) 03/29/2016   HDL 36 (L) 03/29/2016   LDLCALC 167 (H) 03/29/2016   TRIG 160 (H) 03/29/2016   CHOLHDL 6.5 03/29/2016    Significant Diagnostic Results in last 30 days:  Dg Chest 2 View  Result Date: 08/12/2016 CLINICAL DATA:  Initial evaluation for acute fever. EXAM: CHEST  2 VIEW COMPARISON:  Prior radiograph and CT from 05/10/2016. FINDINGS: Mild cardiomegaly, stable.  Mediastinal silhouette normal. Lungs hypoinflated. Mild perihilar vascular congestion without overt pulmonary edema. Patchy and linear bibasilar opacities, favored to reflect atelectasis and/ or bronchovascular crowding, although superimposed infiltrate not entirely excluded, particularly at the left lung base. No pleural effusion. No pneumothorax. No acute osseous abnormality. IMPRESSION: 1. Shallow lung inflation with mild patchy and linear bibasilar opacities. Atelectasis/bronchovascular crowding is favored, although superimposed infiltrates could be  considered in the correct clinical setting, particularly at the left lung base. 2. Cardiomegaly with mild perihilar vascular congestion without overt pulmonary edema. Electronically Signed   By: Jeannine Boga M.D.   On: 08/12/2016 18:07   Dg Ankle Complete Left  Result Date: 08/12/2016 CLINICAL DATA:  Initial evaluation for soft tissue ulceration at heel. Rule out osteo. EXAM: LEFT ANKLE COMPLETE - 3+ VIEW COMPARISON:  None. FINDINGS: Soft tissue irregularity at the plantar aspect of the heel, consistent with history of ulceration. Few scatter locular is of soft tissue emphysema present within this region due to ulceration. No radiopaque foreign body. No radiographic findings to suggest osteomyelitis. No acute fracture dislocation. Ankle mortise approximated. Vascular calcifications noted within the lower leg. IMPRESSION: 1. Soft tissue irregularity at the heel, compatible with history of ulceration in this region. Few scatter locked fills of soft tissue emphysema related to ulceration without radiographic evidence for osteomyelitis. No radiopaque foreign body. 2. No acute osseous abnormality about the ankle. Electronically Signed   By: Jeannine Boga M.D.   On: 08/12/2016 18:09   Dg Ankle  Complete Right  Result Date: 08/12/2016 CLINICAL DATA:  Initial evaluation for heel ulcer. Rule out osteomyelitis. EXAM: RIGHT ANKLE - COMPLETE 3+ VIEW COMPARISON:  None. FINDINGS: No acute fracture or dislocation.  Ankle mortise approximated. Soft tissue irregularity at overlying the lateral malleolus suspicious for ulceration. There is underlying cortical disruption of the distal fibula, suspicious for possible osteomyelitis. No frank periosteal reaction. No dissecting soft tissue emphysema. No radiopaque foreign body. IMPRESSION: 1. Soft tissue irregularity overlying the lateral malleolus, consistent with ulceration. Underlying cortical erosion suspicious for osteomyelitis. 2. No other acute osseous  abnormality about the ankle. Electronically Signed   By: Jeannine Boga M.D.   On: 08/12/2016 18:13   Ct Head Wo Contrast  Result Date: 08/17/2016 CLINICAL DATA:  Acute on chronic alteration in mental status. Slurred speech. EXAM: CT HEAD WITHOUT CONTRAST TECHNIQUE: Contiguous axial images were obtained from the base of the skull through the vertex without intravenous contrast. COMPARISON:  04/24/2016 FINDINGS: Brain: No acute hemorrhage. Stable degree of atrophy and chronic small vessel ischemia, advanced for age. Remote small lacunar infarct in the left pons. No evidence for acute ischemia. No mass effect or midline shift. No subdural or extra-axial fluid collection. No hydrocephalus. Vascular: Atherosclerosis of skullbase vasculature without hyperdense vessel or abnormal calcification. Skull: No fracture or focal lesion. Sinuses/Orbits: Paranasal sinuses and mastoid air cells are clear. The visualized orbits are unremarkable. Bilateral cataract resection. Other: None. IMPRESSION: 1. No evidence of acute intracranial abnormality. 2. Stable atrophy and chronic small vessel ischemia, advanced for age. Electronically Signed   By: Jeb Levering M.D.   On: 08/17/2016 02:49   Mr Brain Wo Contrast  Result Date: 08/19/2016 CLINICAL DATA:  Altered mental status, possible reaction to pain medication, status post bilateral above knee amputations August 16, 2016. History of hypertension, diabetes, RIGHT vertebral artery occlusion. EXAM: MRI HEAD WITHOUT CONTRAST TECHNIQUE: Multiplanar, multiecho pulse sequences of the brain and surrounding structures were obtained without intravenous contrast. COMPARISON:  CT HEAD August 17, 2016 and MRI of the head March 28, 2016 and MRI of the head March 30, 2016 FINDINGS: BRAIN: No reduced diffusion to suggest acute ischemia. Scattered chronic micro hemorrhages. Moderate ventriculomegaly on the basis of global parenchymal brain volume loss. Multiple old pontine lacunar  infarcts. Tiny old cerebellar infarcts. Old bilateral basal ganglia and bilateral thalamus lacunar infarcts. Confluent supratentorial and patchy pontine white matter FLAIR T2 hyperintensities. No midline shift, mass effect or masses. No abnormal extra-axial fluid collections . VASCULAR: Attenuated RIGHT vertebral artery flow void, better characterized on prior MRI. SKULL AND UPPER CERVICAL SPINE: No abnormal sellar expansion. No suspicious calvarial bone marrow signal. Craniocervical junction maintained. SINUSES/ORBITS: Mild paranasal sinus mucosal thickening. Mastoid air cells are well aerated. The included ocular globes and orbital contents are non-suspicious. Status post bilateral ocular lens implants. OTHER: None. IMPRESSION: No acute intracranial process. 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 Moderate to severe chronic small vessel ischemic disease. Moderate parenchymal brain volume loss, advanced for age. Electronically Signed   By: Elon Alas M.D.   On: 08/19/2016 01:52   Mr Foot Right Wo Contrast  Result Date: 08/13/2016 CLINICAL DATA:  NONHEALING DIABETIC SOFT TISSUE ULCER IN EXAM: MRI OF THE RIGHT HINDFOOT WITHOUT CONTRAST TECHNIQUE: Multiplanar, multisequence MR imaging of the ankle was performed. No intravenous contrast was administered. COMPARISON:  RADIOGRAPHS DATED 08/12/2016 FINDINGS: TENDONS Peroneal: Intact peroneus longus and peroneus brevis tendons. Posteromedial: Intact tibialis posterior, flexor hallucis longus and flexor digitorum longus tendons. Anterior: Intact tibialis  anterior, extensor hallucis longus and extensor digitorum longus tendons. Achilles: Intact. Plantar Fascia: Normal. LIGAMENTS Lateral: Intact. Medial: Intact. CARTILAGE Ankle Joint: Small nonspecific ankle joint effusion. No chondral defect. Subtalar Joints/Sinus Tarsi: No joint effusion or chondral defect. Bones: Abnormal edema and erosion of the superficial cortex of the lateral aspect of  the lateral malleolus consistent with osteomyelitis. Overlying soft tissue ulceration. There is periosteal reaction extending 4 cm proximal to the tip of the lateral malleolus. Other: Prominent soft tissue ulceration over the lateral malleolus. Suggestion of soft tissue ulceration posterior laterally adjacent to the posterior calcaneus. No underlying osteomyelitis at that site. IMPRESSION: Osteomyelitis of the lateral malleolus of the distal fibula. Electronically Signed   By: Lorriane Shire M.D.   On: 08/13/2016 14:35   Mr Foot Left Wo Contrast  Result Date: 08/13/2016 CLINICAL DATA:  Diabetic patient with a nonhealing ulcer on the left heel. EXAM: MRI OF THE LEFT FOOT WITHOUT CONTRAST TECHNIQUE: Multiplanar, multisequence MR imaging of the ankle was performed. No intravenous contrast was administered. COMPARISON:  Plain films left ankle 08/12/2016. FINDINGS: TENDONS Peroneal: Intact. Posteromedial: Intact. Anterior: Intact. Achilles: Intrasubstance increased T2 signal is seen in the distal most fibers of the Achilles tendon. There is a small amount fluid in the retrocalcaneal bursa. The tendon is intact. Plantar Fascia: Intact. LIGAMENTS Lateral: Intact. Medial: Intact. CARTILAGE Ankle Joint: Negative. Subtalar Joints/Sinus Tarsi: Negative. Bones: There is intense marrow edema in the posterior 4.5 cm of the calcaneus. Multiple hypointensities within the substance of the inferior calcaneus are likely due to locules of gas. Much milder degree of edema is seen in the more distal calcaneus extending to the calcaneocuboid joint. Edema is seen about the articulations of the medial and middle cuneiforms and navicular and medial cuneiform. Other: There is a large skin ulceration on the plantar surface of the heel. Air is seen within the ulceration. No focal fluid collection is seen. Imaged intrinsic musculature the foot demonstrates increased T2 signal without focal fluid collection. There is subcutaneous edema about  the ankle and foot. IMPRESSION: Large heel ulceration with signal change and gas in at least the posterior 4.5 cm of the calcaneus consistent with emphysematous osteomyelitis. Milder degree in the more distal calcaneus toward the calcaneocuboid joint could be due to osteomyelitis but has an appearance most suggestive of reactive change. Intense subcutaneous edema about the ankle and visualized foot consistent with dependent change and/or cellulitis. Mild intrasubstance of the distal most Achilles tendon consistent with tendinosis, possibly infectious given the patient's heel ulceration. Associated small volume of fluid in the retrocalcaneal bursa could be septic or aseptic. Edema within all imaged intrinsic musculature the foot could be due to denervation atrophy and/or inflammatory change. No intramuscular abscess is seen. Degenerative or neuropathic change about the midfoot. Electronically Signed   By: Inge Rise M.D.   On: 08/13/2016 15:09   Dg Chest Port 1 View  Result Date: 08/18/2016 CLINICAL DATA:  Fever. Hx PNA, MI, HTN, diabetes, CHF, nonsmoker. EXAM: PORTABLE CHEST 1 VIEW COMPARISON:  08/12/2016 FINDINGS: Cardiac silhouette is normal in size. No mediastinal or hilar masses. Lung volumes are low, particularly on the right with there is elevation of right hemidiaphragm. Lungs are clear. No convincing pleural effusion. No pneumothorax. Skeletal structures are grossly intact. IMPRESSION: No active disease. Electronically Signed   By: Lajean Manes M.D.   On: 08/18/2016 14:59    Assessment and Plan  Depression Chronic; continue Prozac 10 mg by mouth daily  HLD (hyperlipidemia) No recent fasting  lipid panel; will continue Lipitor 40 mg by mouth daily; obtain fasting lipid panel  Embolic stroke involving posterior cerebral artery (HCC) Along with PE/DVT; continue Xarelto 20 mg by mouth daily    Marl Seago D. Sheppard Coil, MD

## 2016-07-30 ENCOUNTER — Encounter: Payer: Medicare Other | Admitting: Surgery

## 2016-07-30 DIAGNOSIS — I70233 Atherosclerosis of native arteries of right leg with ulceration of ankle: Secondary | ICD-10-CM | POA: Diagnosis not present

## 2016-07-30 DIAGNOSIS — I70244 Atherosclerosis of native arteries of left leg with ulceration of heel and midfoot: Secondary | ICD-10-CM | POA: Diagnosis not present

## 2016-07-30 DIAGNOSIS — I70234 Atherosclerosis of native arteries of right leg with ulceration of heel and midfoot: Secondary | ICD-10-CM | POA: Diagnosis not present

## 2016-08-01 ENCOUNTER — Non-Acute Institutional Stay (SKILLED_NURSING_FACILITY): Payer: Medicare Other | Admitting: Internal Medicine

## 2016-08-01 ENCOUNTER — Encounter: Payer: Self-pay | Admitting: Internal Medicine

## 2016-08-01 DIAGNOSIS — R410 Disorientation, unspecified: Secondary | ICD-10-CM | POA: Diagnosis not present

## 2016-08-01 DIAGNOSIS — I70243 Atherosclerosis of native arteries of left leg with ulceration of ankle: Secondary | ICD-10-CM

## 2016-08-01 DIAGNOSIS — I70233 Atherosclerosis of native arteries of right leg with ulceration of ankle: Secondary | ICD-10-CM | POA: Diagnosis not present

## 2016-08-01 NOTE — Progress Notes (Signed)
Location:  [facility] Room Number: 110P Place of Service:  SNF (31)  [name] Coil, MD  Patient Care Team: Maury Dus, MD as PCP - General [facility])  Extended Emergency Contact Information Primary Emergency Contact: Terry,Nicole Address: [address]          [place], [place] [address] Johnnette Litter of [place] Phone: [phone] Mobile Phone: [phone] Relation: Daughter Secondary Emergency Contact: Lizabeth Leyden [place] of [place] Phone: [phone] Mobile Phone: [phone] Relation: Daughter    Allergies: Iohexol and Ivp dye [iodinated diagnostic agents]  Chief Complaint  Patient presents with  . Acute Visit    Acute    HPI: Patient is 61 y.o. male who Is being seen today for increased confusion. Patient is talking about going to work and other things that don't make sense. Patient has been confused since arrival but this is just a little bit more. Patient also was found to have a fever of 100.3 last night, fever has been normal today. Patient has arterial wounds on the right and left ankle and his right heel per nursing they do not appear infected. Patient cannot really give any history.  Past Medical History:  Diagnosis Date  . Adrenal insufficiency ([place])   . Allergy   . Arthritis   . Blind left eye   . Carpal tunnel syndrome   . Cataract    bil removed  . CHF (congestive heart failure) ([place])   . Diabetes mellitus without complication ([place])   . ED (erectile dysfunction)   . Gout   . Herpes   . Hypercholesterolemia   . Hypertension   . MI (myocardial infarction) ([place])   . Nausea & vomiting   . Peripheral neuropathy   . Pneumonia   . PVD (peripheral vascular disease) ([place])     Past Surgical History:  Procedure Laterality Date  . ABDOMINAL AORTOGRAM W/LOWER EXTREMITY N/A 06/19/2016   Procedure: Abdominal Aortogram w/Lower Extremity;  Surgeon: Serafina Mitchell, MD;  Location: [facility]  LAB;  Service: Cardiovascular;  Laterality: N/A;  . CHOLECYSTECTOMY    . CORONARY ANGIOPLASTY WITH STENT PLACEMENT  2007   two 2.5  x 13 mm Cypher stents to the RCA  . dilatera cataracts removed    . right knee surgary      Allergies as of 08/01/2016      Reactions   Iohexol Hives   Ivp Dye [iodinated Diagnostic Agents] Hives      Medication List       Accurate as of 08/01/16  2:19 PM. Always use your most recent med list.          acetaminophen 160 MG/5ML suspension Commonly known as:  TYLENOL Take 650 mg by mouth as needed for mild pain. 20.3 ml ( 650 mg total)   albuterol (2.5 MG/3ML) 0.083% nebulizer solution Commonly known as:  PROVENTIL Take 3 mLs (2.5 mg total) by nebulization every 2 (two) hours as needed for shortness of breath.   amLODipine 10 MG tablet Commonly known as:  NORVASC Take 1 tablet (10 mg total) by mouth daily.   atorvastatin 40 MG tablet Commonly known as:  LIPITOR Take 1 tablet (40 mg total) by mouth daily at 6 PM.   bethanechol 10 MG tablet Commonly known as:  URECHOLINE Take 1 tablet (10 mg total) by mouth 3 (three) times daily.   bisacodyl 10 MG suppository Commonly known as:  DULCOLAX Place 10 mg rectally as needed for  moderate constipation.   carvedilol 25 MG tablet Commonly known as:  COREG Take 1 tablet (25 mg total) by mouth 2 (two) times daily with a meal.   cloNIDine 0.3 MG tablet Commonly known as:  CATAPRES Take 1 tablet (0.3 mg total) by mouth 3 (three) times daily.   feeding supplement (PRO-STAT SUGAR FREE 64) Liqd Take 30 mLs by mouth 2 (two) times daily.   FLUoxetine 10 MG capsule Commonly known as:  PROZAC Take 1 capsule (10 mg total) by mouth daily.   furosemide 40 MG tablet Commonly known as:  LASIX Take 40 mg by mouth daily as needed for edema.   GLUCERNA Liqd Take 237 mLs by mouth 4 (four) times daily.   hydrocerin Crea Apply 1 application topically 2 (two) times daily.   hydrocortisone 2.5 % rectal  cream Commonly known as:  ANUSOL-HC Place rectally 2 (two) times daily as needed for hemorrhoids or itching.   insulin aspart 100 UNIT/ML FlexPen Commonly known as:  NOVOLOG Inject into the skin 3 (three) times daily with meals. CBG with meals and SSI as follows less than 150 no insulin, 151 - 200 = 3 units, 201 - 250 = 5 units, 251 - 300 = 8 units, 301 - 350 = 11 units, 351 - 400 = 15 units, greater than 400 give 15 units and call MD   isosorbide-hydrALAZINE 20-37.5 MG tablet Commonly known as:  BIDIL Take 2 tablets by mouth 3 (three) times daily.   lidocaine 2 % jelly Commonly known as:  XYLOCAINE Apply topically as needed (Use with in and out catheter).   mirtazapine 7.5 MG tablet Commonly known as:  REMERON Take 1 tablet (7.5 mg total) by mouth at bedtime.   MULTIVITAMIN PO Take 1 tablet by mouth daily.   nitroGLYCERIN 0.4 MG SL tablet Commonly known as:  NITROSTAT Place 1 tablet (0.4 mg total) under the tongue every 5 (five) minutes as needed for chest pain.   ondansetron 4 MG disintegrating tablet Commonly known as:  ZOFRAN-ODT Take 4 mg by mouth every 6 (six) hours as needed for nausea or vomiting.   polyethylene glycol packet Commonly known as:  MIRALAX / GLYCOLAX Take 17 g by mouth daily.   RA SALINE ENEMA 19-7 GM/118ML Enem Place 1 each rectally as needed (for constipation).   rivaroxaban 20 MG Tabs tablet Commonly known as:  XARELTO Take 1 tablet (20 mg total) by mouth daily with supper.   senna-docusate 8.6-50 MG tablet Commonly known as:  Senokot-S Take 1 tablet by mouth daily.   tamsulosin 0.4 MG Caps capsule Commonly known as:  FLOMAX Take 1 capsule (0.4 mg total) by mouth daily after supper.   TOUJEO SOLOSTAR 300 UNIT/ML Sopn Generic drug:  Insulin Glargine Inject 60 Units into the skin at bedtime.   zinc sulfate 220 (50 Zn) MG capsule Take 220 mg by mouth daily.       Meds ordered this encounter  Medications  . bisacodyl (DULCOLAX) 10 MG  suppository    Sig: Place 10 mg rectally as needed for moderate constipation.    Immunization History  Administered Date(s) Administered  . PPD Test 04/25/2016    Social History  Substance Use Topics  . Smoking status: Never Smoker  . Smokeless tobacco: Never Used  . Alcohol use No    Review of Systems  UTO 2/2 confusion; nursing as per HPI    Vitals:   08/01/16 1409  BP: 138/71  Pulse: 78  Resp: 20  Temp:  97.3 F (36.3 C)   Body mass index is 31.57 kg/m. Physical Exam  GENERAL APPEARANCE: Alert, conversant, No acute distress  SKIN: Wounds on right and left ankle and right heel have no drainage redness swelling or heat HEENT: Unremarkable RESPIRATORY: Breathing is even, unlabored. Lung sounds are clear   CARDIOVASCULAR: Heart RRR no murmurs, rubs or gallops. Trace peripheral edema  GASTROINTESTINAL: Abdomen is soft, non-tender, not distended w/ normal bowel sounds.  GENITOURINARY: Bladder non tender, not distended  MUSCULOSKELETAL: No abnormal joints or musculature NEUROLOGIC: Cranial nerves 2-12 grossly intact. Moves all extremities PSYCHIATRIC: Pleasant, confusion,, no behavioral issues  Patient Active Problem List   Diagnosis Date Noted  . Acute respiratory failure with hypoxia ([place]) 05/15/2016  . HLD (hyperlipidemia) 05/15/2016  . Nausea and vomiting   . Hypoxia 05/10/2016  . Elevated troponin 05/10/2016  . AKI (acute kidney injury) ([place]) 05/10/2016  . Pressure injury of skin 05/10/2016  . CKD (chronic kidney disease) stage 3, GFR 30-59 ml/min 04/28/2016  . Hypernatremia 04/28/2016  . Dysphagia due to recent stroke 04/28/2016  . Cystitis due to Pseudomonas 04/28/2016  . Enterococcus UTI 04/28/2016  . Hyperlipidemia associated with type 2 diabetes mellitus ([place]) 04/28/2016  . Depression 04/28/2016  . Urinary retention   . Accidental fall from chair   . FUO (fever of unknown origin)   . Cognitive and behavioral changes   . Cerebrovascular accident  (CVA) due to embolism of left posterior cerebral artery ([place])   . Lethargy   . Acute on chronic renal insufficiency   . Low back pain   . Leukocytosis   . Labile blood glucose   . Hypoglycemia due to insulin   . UTI due to Klebsiella species   . Deep tissue injury   . Renovascular hypertension   . History of DVT (deep vein thrombosis)   . Coronary artery disease involving native coronary artery of native heart without angina pectoris   . Morbid obesity ([place])   . Stage 2 chronic kidney disease   . Hypokalemia   . Acute blood loss anemia   . Embolic stroke involving posterior cerebral artery ([place]) 04/03/2016  . Brainstem stroke ([place]) 04/01/2016  . Abnormality of gait and mobility   . Cerebral infarction due to thrombosis of other cerebral artery ([place])   . Right arm numbness   . Left sided numbness 03/29/2016  . TIA (transient ischemic attack) 03/29/2016  . Hypertensive urgency 03/29/2016  . Sepsis ([place]) 09/10/2013  . DVT (deep venous thrombosis) ([place]) 01/01/2013  . Rash and nonspecific skin eruption 12/29/2012  . Cellulitis and abscess of leg 12/29/2012  . Diabetes type 2, uncontrolled ([place]) 12/29/2012  . Hypertension   . Cardiomyopathy ([place])   . CHF (congestive heart failure) ([place])   . Peripheral neuropathy   . Special screening for malignant neoplasms, colon 12/09/2012    CMP     Component Value Date/Time   NA 140 06/19/2016 0753   NA 140 06/19/2016   K 3.7 06/19/2016 0753   CL 103 06/19/2016 0753   CO2 25 06/05/2016 1227   GLUCOSE 107 (H) 06/19/2016 0753   BUN 12 06/19/2016 0753   BUN 12 06/19/2016   CREATININE 0.90 06/19/2016 0753   CREATININE 1.08 06/05/2016 1227   CALCIUM 7.9 (L) 06/05/2016 1227   PROT 8.1 05/10/2016 1027   ALBUMIN 2.7 (L) 05/10/2016 1027   AST 131 (H) 05/10/2016 1027   ALT 138 (H) 05/10/2016 1027   ALKPHOS 70 05/10/2016 1027   BILITOT 0.5  05/10/2016 1027   GFRNONAA 35 (L) 05/12/2016 0446   GFRAA 41 (L) 05/12/2016 0446    Recent Labs   04/01/16 0221  05/10/16 1027  05/12/16 0446 05/14/16 06/05/16 1227 06/19/16 06/19/16 0753  NA 139  < > 141  < > 143 149* 141 140 140  K 3.5  < > 3.8  --  3.8 4.6 4.4  --  3.7  CL 105  < > 104  --  109  --  107  --  103  CO2 26  < > 27  --  27  --  25  --   --   GLUCOSE 240*  < > 148*  --  240*  --  145*  --  107*  BUN 18  < > 65*  < > 57* 54* 15 12 12   CREATININE 1.49*  < > 2.05*  < > 1.98* 1.8* 1.08 0.9 0.90  CALCIUM 9.0  < > 8.6*  --  8.4*  --  7.9*  --   --   MG 1.9  --   --   --   --   --   --   --   --   < > = values in this interval not displayed.  Recent Labs  03/28/16 2113 04/04/16 0516 05/10/16 1027  AST 24 17 131*  ALT 17 12* 138*  ALKPHOS 61 43 70  BILITOT 0.6 0.5 0.5  PROT 7.7 6.9 8.1  ALBUMIN 3.8 2.9* 2.7*    Recent Labs  04/18/16 0441  04/23/16 1151  05/10/16 1027  05/12/16 0446 06/05/16 06/05/16 1227 06/19/16 0753  WBC 8.4  < > 6.7  < > 10.5  < > 13.5* 7.0 7.0  --   NEUTROABS 4.9  --  4.7  --  7.4  --   --   --   --   --   HGB 12.6*  --  13.7  < > 12.6*  --  12.1*  --  10.4* 10.9*  HCT 40.0  --  43.7  < > 39.1  --  38.4*  --  31.8* 32.0*  MCV 90.9  --  92.4  --  89.9  --  91.6  --  86.2  --   PLT 344  --  237  < > 253  --  271  --  274  --   < > = values in this interval not displayed.  Recent Labs  03/29/16 0531  CHOL 235*  LDLCALC 167*  TRIG 160*   No results found for: MICROALBUR No results found for: TSH Lab Results  Component Value Date   HGBA1C 11.0 (H) 03/29/2016   Lab Results  Component Value Date   CHOL 235 (H) 03/29/2016   HDL 36 (L) 03/29/2016   LDLCALC 167 (H) 03/29/2016   TRIG 160 (H) 03/29/2016   CHOLHDL 6.5 03/29/2016    Significant Diagnostic Results in last 30 days:  No results found.  Assessment and Plan  INCREASED CONFUSION/PVD-have ordered CBC with differential, CMP, and chest x-ray. Patient's wounds on both ankles and right heel do not appear infected; patient is confusional state seems more  psychological/dementia will start patient on Seroquel 25 mg by mouth daily at bedtime until psychiatry comes to facility and can see him    Hennie Duos, MD

## 2016-08-02 ENCOUNTER — Encounter: Payer: Self-pay | Admitting: Surgery

## 2016-08-03 ENCOUNTER — Ambulatory Visit (HOSPITAL_COMMUNITY)
Admission: RE | Admit: 2016-08-03 | Discharge: 2016-08-03 | Disposition: A | Discharge status: Home / Self Care | Payer: No Typology Code available for payment source | Source: Ambulatory Visit | Attending: Surgery | Admitting: Surgery

## 2016-08-03 DIAGNOSIS — I7025 Atherosclerosis of native arteries of other extremities with ulceration: Secondary | ICD-10-CM | POA: Diagnosis not present

## 2016-08-04 NOTE — Progress Notes (Signed)
Cardiology Office Note   Date:  08/06/2016   ID:  [name], DOB 02/15/1956, MRN [id number]  PCP:  Maury Dus, MD  Cardiologist:   Dorris Carnes, MD       History of Present Illness: [name] is a 62 y.o. male with a history of CVA, HTN, ICM (LVEF 25 to 30%), DVT,   He was admited in march with resp failure  LVEF 55%  He was seen in f/u by R Barrett in April 2018  Unable to question pt He had to have bowel movement  Unable to access toilet here  Taken back to home     Current Meds  Medication Sig  . acetaminophen (TYLENOL) 160 MG/5ML suspension Take 650 mg by mouth as needed for mild pain. 20.3 ml ( 650 mg total)   . albuterol (PROVENTIL) (2.5 MG/3ML) 0.083% nebulizer solution Take 3 mLs (2.5 mg total) by nebulization every 2 (two) hours as needed for shortness of breath.  . Amino Acids-Protein Hydrolys (FEEDING SUPPLEMENT, PRO-STAT SUGAR FREE 64,) LIQD Take 30 mLs by mouth 2 (two) times daily.  Marland Kitchen amLODipine (NORVASC) 10 MG tablet Take 1 tablet (10 mg total) by mouth daily.  Marland Kitchen atorvastatin (LIPITOR) 40 MG tablet Take 1 tablet (40 mg total) by mouth daily at 6 PM.  . bethanechol (URECHOLINE) 10 MG tablet Take 1 tablet (10 mg total) by mouth 3 (three) times daily.  . bisacodyl (DULCOLAX) 10 MG suppository Place 10 mg rectally as needed for moderate constipation.  . carvedilol (COREG) 25 MG tablet Take 1 tablet (25 mg total) by mouth 2 (two) times daily with a meal.  . cloNIDine (CATAPRES) 0.3 MG tablet Take 1 tablet (0.3 mg total) by mouth 3 (three) times daily.  Marland Kitchen FLUoxetine (PROZAC) 10 MG capsule Take 1 capsule (10 mg total) by mouth daily.  . furosemide (LASIX) 40 MG tablet Take 40 mg by mouth daily as needed for edema.  Marland Kitchen GLUCERNA (GLUCERNA) LIQD Take 237 mLs by mouth 4 (four) times daily.  . hydrocerin (EUCERIN) CREA Apply 1 application topically 2 (two) times daily.  . hydrocortisone (ANUSOL-HC) 2.5 % rectal cream Place rectally 2 (two) times daily as needed for  hemorrhoids or itching.  . insulin aspart (NOVOLOG) 100 UNIT/ML FlexPen Inject into the skin 3 (three) times daily with meals. CBG with meals and SSI as follows less than 150 no insulin, 151 - 200 = 3 units, 201 - 250 = 5 units, 251 - 300 = 8 units, 301 - 350 = 11 units, 351 - 400 = 15 units, greater than 400 give 15 units and call MD  . Insulin Glargine (TOUJEO SOLOSTAR) 300 UNIT/ML SOPN Inject 60 Units into the skin at bedtime.  . isosorbide-hydrALAZINE (BIDIL) 20-37.5 MG tablet Take 2 tablets by mouth 3 (three) times daily.  Marland Kitchen lidocaine (XYLOCAINE) 2 % jelly Apply topically as needed (Use with in and out catheter).  . mirtazapine (REMERON) 7.5 MG tablet Take 1 tablet (7.5 mg total) by mouth at bedtime.  . Multiple Vitamins-Minerals (MULTIVITAMIN PO) Take 1 tablet by mouth daily.  . nitroGLYCERIN (NITROSTAT) 0.4 MG SL tablet Place 1 tablet (0.4 mg total) under the tongue every 5 (five) minutes as needed for chest pain.  Marland Kitchen ondansetron (ZOFRAN-ODT) 4 MG disintegrating tablet Take 4 mg by mouth every 6 (six) hours as needed for nausea or vomiting.   . polyethylene glycol (MIRALAX / GLYCOLAX) packet Take 17 g by mouth daily.  . QUETIAPINE FUMARATE PO  Take by mouth.  . rivaroxaban (XARELTO) 20 MG TABS tablet Take 1 tablet (20 mg total) by mouth daily with supper.  . senna-docusate (SENOKOT-S) 8.6-50 MG tablet Take 1 tablet by mouth daily.  . Sodium Phosphates (RA SALINE ENEMA) 19-7 GM/118ML ENEM Place 1 each rectally as needed (for constipation).  . tamsulosin (FLOMAX) 0.4 MG CAPS capsule Take 1 capsule (0.4 mg total) by mouth daily after supper.  . zinc sulfate 220 (50 Zn) MG capsule Take 220 mg by mouth daily.     Allergies:   Iohexol and Ivp dye [iodinated diagnostic agents]   Past Medical History:  Diagnosis Date  . Adrenal insufficiency ([place])   . Allergy   . Arthritis   . Blind left eye   . Carpal tunnel syndrome   . Cataract    bil removed  . CHF (congestive heart failure) ([place])   .  Diabetes mellitus without complication ([place])   . ED (erectile dysfunction)   . Gout   . Herpes   . Hypercholesterolemia   . Hypertension   . MI (myocardial infarction) ([place])   . Nausea & vomiting   . Peripheral neuropathy   . Pneumonia   . PVD (peripheral vascular disease) ([place])     Past Surgical History:  Procedure Laterality Date  . ABDOMINAL AORTOGRAM W/LOWER EXTREMITY N/A 06/19/2016   Procedure: Abdominal Aortogram w/Lower Extremity;  Surgeon: Serafina Mitchell, MD;  Location: [facility];  Service: Cardiovascular;  Laterality: N/A;  . CHOLECYSTECTOMY    . CORONARY ANGIOPLASTY WITH STENT PLACEMENT  2007   two 2.5  x 13 mm Cypher stents to the RCA  . dilatera cataracts removed    . right knee surgary       Social History:  The patient  reports that he has never smoked. He has never used smokeless tobacco. He reports that he does not drink alcohol or use drugs.   Family History:  The patient's family history includes Breast cancer in his mother; Diabetes in his maternal aunt; Heart attack in his sister; Hypertension in his father.    ROS:  Please see the history of present illness. All other systems are reviewed and  Negative to the above problem except as noted.    PHYSICAL EXAM: VS:  BP (!) 148/84   Pulse 81   Ht 5\' 9"  (1.753 m)   Wt 104.3 kg (230 lb)   SpO2 96%   BMI 33.97 kg/m   GEN: Obese 61 yo examined in wheel chair   HEENT: normal  Neck: no JVD, No carotid bruits, or masses Cardiac: RRR; no murmurs, rubs, or gallops,Tr   edema  Respiratory: Relatively  clear to auscultation bilaterally, normal work of breathing GI: soft, nontender   MS: Heels wrapped   Neuro:  Deferred      EKG:  EKG is not  ordered today.  On 05/11/16  SR  66 bpm  T wave inversion I, AVL     Lipid Panel    Component Value Date/Time   CHOL 235 (H) 03/29/2016 0531   TRIG 160 (H) 03/29/2016 0531   HDL 36 (L) 03/29/2016 0531   CHOLHDL 6.5 03/29/2016 0531   VLDL 32 03/29/2016  0531   LDLCALC 167 (H) 03/29/2016 0531      Wt Readings from Last 3 Encounters:  08/06/16 104.3 kg (230 lb)  08/01/16 97 kg (213 lb 12.8 oz)  07/25/16 94.3 kg (208 lb)      ASSESSMENT AND PLAN:  Unable to question pt  Had to use bathroom before fully evaluated Volume appeared to be up a little  Not bad.    Pt taken back to nursing facility    Current medicines are reviewed at length with the patient today.  The patient does not have concerns regarding medicines.  Signed, Dorris Carnes, MD  08/06/2016 1:32 PM    [facility] [place], [place], [place]  [address] Phone: [phone]; Fax: [phone]

## 2016-08-05 DIAGNOSIS — I69351 Hemiplegia and hemiparesis following cerebral infarction affecting right dominant side: Secondary | ICD-10-CM | POA: Diagnosis not present

## 2016-08-05 DIAGNOSIS — R488 Other symbolic dysfunctions: Secondary | ICD-10-CM | POA: Diagnosis not present

## 2016-08-05 DIAGNOSIS — M6281 Muscle weakness (generalized): Secondary | ICD-10-CM | POA: Diagnosis not present

## 2016-08-05 DIAGNOSIS — R1312 Dysphagia, oropharyngeal phase: Secondary | ICD-10-CM | POA: Diagnosis not present

## 2016-08-06 ENCOUNTER — Ambulatory Visit (INDEPENDENT_AMBULATORY_CARE_PROVIDER_SITE_OTHER): Payer: Self-pay | Admitting: Internal Medicine

## 2016-08-06 ENCOUNTER — Encounter: Payer: Self-pay | Admitting: Internal Medicine

## 2016-08-06 VITALS — BP 148/84 | HR 81 | Ht 69.0 in | Wt 230.0 lb

## 2016-08-06 DIAGNOSIS — M6281 Muscle weakness (generalized): Secondary | ICD-10-CM | POA: Diagnosis not present

## 2016-08-06 DIAGNOSIS — R1312 Dysphagia, oropharyngeal phase: Secondary | ICD-10-CM | POA: Diagnosis not present

## 2016-08-06 DIAGNOSIS — I70244 Atherosclerosis of native arteries of left leg with ulceration of heel and midfoot: Secondary | ICD-10-CM | POA: Diagnosis not present

## 2016-08-06 DIAGNOSIS — I70234 Atherosclerosis of native arteries of right leg with ulceration of heel and midfoot: Secondary | ICD-10-CM | POA: Diagnosis not present

## 2016-08-06 DIAGNOSIS — R488 Other symbolic dysfunctions: Secondary | ICD-10-CM | POA: Diagnosis not present

## 2016-08-06 DIAGNOSIS — I5032 Chronic diastolic (congestive) heart failure: Secondary | ICD-10-CM

## 2016-08-06 DIAGNOSIS — I7025 Atherosclerosis of native arteries of other extremities with ulceration: Secondary | ICD-10-CM

## 2016-08-06 DIAGNOSIS — I70233 Atherosclerosis of native arteries of right leg with ulceration of ankle: Secondary | ICD-10-CM | POA: Diagnosis not present

## 2016-08-06 DIAGNOSIS — I69351 Hemiplegia and hemiparesis following cerebral infarction affecting right dominant side: Secondary | ICD-10-CM | POA: Diagnosis not present

## 2016-08-07 ENCOUNTER — Encounter: Payer: Self-pay | Admitting: Internal Medicine

## 2016-08-07 DIAGNOSIS — M6281 Muscle weakness (generalized): Secondary | ICD-10-CM | POA: Diagnosis not present

## 2016-08-07 DIAGNOSIS — R488 Other symbolic dysfunctions: Secondary | ICD-10-CM | POA: Diagnosis not present

## 2016-08-07 DIAGNOSIS — I69351 Hemiplegia and hemiparesis following cerebral infarction affecting right dominant side: Secondary | ICD-10-CM | POA: Diagnosis not present

## 2016-08-07 DIAGNOSIS — R1312 Dysphagia, oropharyngeal phase: Secondary | ICD-10-CM | POA: Diagnosis not present

## 2016-08-07 NOTE — Progress Notes (Signed)
Location:  [place] Room Number: 110P Place of Service:  SNF (31)  James Delaine. Sheppard Coil, MD  Patient Care Team: Maury Dus, MD as PCP - General [facility] Medicine)  Extended Emergency Contact Information Primary Emergency Contact: Terry,Nicole Address: [address]          [place], [place] [address] Johnnette Litter of [place] Phone: [phone] Mobile Phone: [phone] Relation: Daughter Secondary Emergency Contact: Lizabeth Leyden [place] of [place] Phone: [phone] Mobile Phone: [phone] Relation: Daughter    Allergies: Iohexol and Ivp dye [iodinated diagnostic agents]  Chief Complaint  Patient presents with  . Acute Visit    Acute    HPI: Patient is 61 y.o. male who   Past Medical History:  Diagnosis Date  . Adrenal insufficiency ([place])   . Allergy   . Arthritis   . Blind left eye   . Carpal tunnel syndrome   . Cataract    bil removed  . CHF (congestive heart failure) ([place])   . Diabetes mellitus without complication ([place])   . ED (erectile dysfunction)   . Gout   . Herpes   . Hypercholesterolemia   . Hypertension   . MI (myocardial infarction) ([place])   . Nausea & vomiting   . Peripheral neuropathy   . Pneumonia   . PVD (peripheral vascular disease) ([place])     Past Surgical History:  Procedure Laterality Date  . ABDOMINAL AORTOGRAM W/LOWER EXTREMITY N/A 06/19/2016   Procedure: Abdominal Aortogram w/Lower Extremity;  Surgeon: Serafina Mitchell, MD;  Location: [facility] CV LAB;  Service: Cardiovascular;  Laterality: N/A;  . CHOLECYSTECTOMY    . CORONARY ANGIOPLASTY WITH STENT PLACEMENT  2007   two 2.5  x 13 mm Cypher stents to the RCA  . dilatera cataracts removed    . right knee surgary      Allergies as of 08/06/2016      Reactions   Iohexol Hives   Ivp Dye [iodinated Diagnostic Agents] Hives      Medication List       Accurate as of 08/06/16 11:59 PM. Always use your most recent med list.          acetaminophen 160 MG/5ML suspension Commonly known as:  TYLENOL Take 650 mg by mouth as needed for mild pain. 20.3 ml ( 650 mg total)   albuterol (2.5 MG/3ML) 0.083% nebulizer solution Commonly known as:  PROVENTIL Take 3 mLs (2.5 mg total) by nebulization every 2 (two) hours as needed for shortness of breath.   amLODipine 10 MG tablet Commonly known as:  NORVASC Take 1 tablet (10 mg total) by mouth daily.   atorvastatin 40 MG tablet Commonly known as:  LIPITOR Take 1 tablet (40 mg total) by mouth daily at 6 PM.   bethanechol 10 MG tablet Commonly known as:  URECHOLINE Take 1 tablet (10 mg total) by mouth 3 (three) times daily.   bisacodyl 10 MG suppository Commonly known as:  DULCOLAX Place 10 mg rectally as needed for moderate constipation.   carvedilol 25 MG tablet Commonly known as:  COREG Take 1 tablet (25 mg total) by mouth 2 (two) times daily with a meal.   cloNIDine 0.3 MG tablet Commonly known as:  CATAPRES Take 1 tablet (0.3 mg total) by mouth 3 (three) times daily.   feeding supplement (PRO-STAT SUGAR FREE 64) Liqd Take 30 mLs by mouth 2 (two) times daily.   FLUoxetine 10 MG capsule Commonly known as:  PROZAC  Take 1 capsule (10 mg total) by mouth daily.   furosemide 40 MG tablet Commonly known as:  LASIX Take 40 mg by mouth daily as needed for edema.   GLUCERNA Liqd Take 237 mLs by mouth 4 (four) times daily.   hydrocerin Crea Apply 1 application topically 2 (two) times daily.   hydrocortisone 2.5 % rectal cream Commonly known as:  ANUSOL-HC Place rectally 2 (two) times daily as needed for hemorrhoids or itching.   insulin aspart 100 UNIT/ML FlexPen Commonly known as:  NOVOLOG Inject into the skin 3 (three) times daily with meals. CBG with meals and SSI as follows less than 150 no insulin, 151 - 200 = 3 units, 201 - 250 = 5 units, 251 - 300 = 8 units, 301 - 350 = 11 units, 351 - 400 = 15 units, greater than 400 give 15 units and call MD     isosorbide-hydrALAZINE 20-37.5 MG tablet Commonly known as:  BIDIL Take 2 tablets by mouth 3 (three) times daily.   lidocaine 2 % jelly Commonly known as:  XYLOCAINE Apply topically as needed (Use with in and out catheter).   mirtazapine 7.5 MG tablet Commonly known as:  REMERON Take 1 tablet (7.5 mg total) by mouth at bedtime.   MULTIVITAMIN PO Take 1 tablet by mouth daily.   nitroGLYCERIN 0.4 MG SL tablet Commonly known as:  NITROSTAT Place 1 tablet (0.4 mg total) under the tongue every 5 (five) minutes as needed for chest pain.   ondansetron 4 MG disintegrating tablet Commonly known as:  ZOFRAN-ODT Take 4 mg by mouth every 6 (six) hours as needed for nausea or vomiting.   polyethylene glycol packet Commonly known as:  MIRALAX / GLYCOLAX Take 17 g by mouth daily.   QUETIAPINE FUMARATE PO Take by mouth.   RA SALINE ENEMA 19-7 GM/118ML Enem Place 1 each rectally as needed (for constipation).   rivaroxaban 20 MG Tabs tablet Commonly known as:  XARELTO Take 1 tablet (20 mg total) by mouth daily with supper.   senna-docusate 8.6-50 MG tablet Commonly known as:  Senokot-S Take 1 tablet by mouth daily.   tamsulosin 0.4 MG Caps capsule Commonly known as:  FLOMAX Take 1 capsule (0.4 mg total) by mouth daily after supper.   TOUJEO SOLOSTAR 300 UNIT/ML Sopn Generic drug:  Insulin Glargine Inject 60 Units into the skin at bedtime.   zinc sulfate 220 (50 Zn) MG capsule Take 220 mg by mouth daily.       No orders of the defined types were placed in this encounter.   Immunization History  Administered Date(s) Administered  . PPD Test 04/25/2016    Social History  Substance Use Topics  . Smoking status: Never Smoker  . Smokeless tobacco: Never Used  . Alcohol use No    Review of Systems  DATA OBTAINED: from patient, nurse, medical record, family member GENERAL:  no fevers, fatigue, appetite changes SKIN: No itching, rash HEENT: No complaint RESPIRATORY:  No cough, wheezing, SOB CARDIAC: No chest pain, palpitations, lower extremity edema  GI: No abdominal pain, No N/V/D or constipation, No heartburn or reflux  GU: No dysuria, frequency or urgency, or incontinence  MUSCULOSKELETAL: No unrelieved bone/joint pain NEUROLOGIC: No headache, dizziness  PSYCHIATRIC: No overt anxiety or sadness  Vitals:   08/06/16 0952  BP: (!) 159/81  Pulse: 89  Resp: 20  Temp: 97.6 F (36.4 C)   Body mass index is 33.97 kg/m. Physical Exam  GENERAL APPEARANCE: Alert, conversant, No  acute distress  SKIN: No diaphoresis rash HEENT: Unremarkable RESPIRATORY: Breathing is even, unlabored. Lung sounds are clear   CARDIOVASCULAR: Heart RRR no murmurs, rubs or gallops. No peripheral edema  GASTROINTESTINAL: Abdomen is soft, non-tender, not distended w/ normal bowel sounds.  GENITOURINARY: Bladder non tender, not distended  MUSCULOSKELETAL: No abnormal joints or musculature NEUROLOGIC: Cranial nerves 2-12 grossly intact. Moves all extremities PSYCHIATRIC: Mood and affect appropriate to situation, no behavioral issues  Patient Active Problem List   Diagnosis Date Noted  . Acute respiratory failure with hypoxia ([place]) 05/15/2016  . HLD (hyperlipidemia) 05/15/2016  . Nausea and vomiting   . Hypoxia 05/10/2016  . Elevated troponin 05/10/2016  . AKI (acute kidney injury) ([place]) 05/10/2016  . Pressure injury of skin 05/10/2016  . CKD (chronic kidney disease) stage 3, GFR 30-59 ml/min 04/28/2016  . Hypernatremia 04/28/2016  . Dysphagia due to recent stroke 04/28/2016  . Cystitis due to Pseudomonas 04/28/2016  . Enterococcus UTI 04/28/2016  . Hyperlipidemia associated with type 2 diabetes mellitus ([place]) 04/28/2016  . Depression 04/28/2016  . Urinary retention   . Accidental fall from chair   . FUO (fever of unknown origin)   . Cognitive and behavioral changes   . Cerebrovascular accident (CVA) due to embolism of left posterior cerebral artery ([place])   .  Lethargy   . Acute on chronic renal insufficiency   . Low back pain   . Leukocytosis   . Labile blood glucose   . Hypoglycemia due to insulin   . UTI due to Klebsiella species   . Deep tissue injury   . Renovascular hypertension   . History of DVT (deep vein thrombosis)   . Coronary artery disease involving native coronary artery of native heart without angina pectoris   . Morbid obesity ([place])   . Stage 2 chronic kidney disease   . Hypokalemia   . Acute blood loss anemia   . Embolic stroke involving posterior cerebral artery ([place]) 04/03/2016  . Brainstem stroke ([place]) 04/01/2016  . Abnormality of gait and mobility   . Cerebral infarction due to thrombosis of other cerebral artery ([place])   . Right arm numbness   . Left sided numbness 03/29/2016  . TIA (transient ischemic attack) 03/29/2016  . Hypertensive urgency 03/29/2016  . Sepsis ([place]) 09/10/2013  . DVT (deep venous thrombosis) ([place]) 01/01/2013  . Rash and nonspecific skin eruption 12/29/2012  . Cellulitis and abscess of leg 12/29/2012  . Diabetes type 2, uncontrolled ([place]) 12/29/2012  . Hypertension   . Cardiomyopathy ([place])   . CHF (congestive heart failure) ([place])   . Peripheral neuropathy   . Special screening for malignant neoplasms, colon 12/09/2012    CMP     Component Value Date/Time   NA 140 06/19/2016 0753   NA 140 06/19/2016   K 3.7 06/19/2016 0753   CL 103 06/19/2016 0753   CO2 25 06/05/2016 1227   GLUCOSE 107 (H) 06/19/2016 0753   BUN 12 06/19/2016 0753   BUN 12 06/19/2016   CREATININE 0.90 06/19/2016 0753   CREATININE 1.08 06/05/2016 1227   CALCIUM 7.9 (L) 06/05/2016 1227   PROT 8.1 05/10/2016 1027   ALBUMIN 2.7 (L) 05/10/2016 1027   AST 131 (H) 05/10/2016 1027   ALT 138 (H) 05/10/2016 1027   ALKPHOS 70 05/10/2016 1027   BILITOT 0.5 05/10/2016 1027   GFRNONAA 35 (L) 05/12/2016 0446   GFRAA 41 (L) 05/12/2016 0446    Recent Labs  04/01/16 0221  05/10/16 1027  05/12/16  0865 05/14/16  06/05/16 1227 06/19/16 06/19/16 0753  NA 139  < > 141  < > 143 149* 141 140 140  K 3.5  < > 3.8  --  3.8 4.6 4.4  --  3.7  CL 105  < > 104  --  109  --  107  --  103  CO2 26  < > 27  --  27  --  25  --   --   GLUCOSE 240*  < > 148*  --  240*  --  145*  --  107*  BUN 18  < > 65*  < > 57* 54* 15 12 12   CREATININE 1.49*  < > 2.05*  < > 1.98* 1.8* 1.08 0.9 0.90  CALCIUM 9.0  < > 8.6*  --  8.4*  --  7.9*  --   --   MG 1.9  --   --   --   --   --   --   --   --   < > = values in this interval not displayed.  Recent Labs  03/28/16 2113 04/04/16 0516 05/10/16 1027  AST 24 17 131*  ALT 17 12* 138*  ALKPHOS 61 43 70  BILITOT 0.6 0.5 0.5  PROT 7.7 6.9 8.1  ALBUMIN 3.8 2.9* 2.7*    Recent Labs  04/18/16 0441  04/23/16 1151  05/10/16 1027  05/12/16 0446 06/05/16 06/05/16 1227 06/19/16 0753  WBC 8.4  < > 6.7  < > 10.5  < > 13.5* 7.0 7.0  --   NEUTROABS 4.9  --  4.7  --  7.4  --   --   --   --   --   HGB 12.6*  --  13.7  < > 12.6*  --  12.1*  --  10.4* 10.9*  HCT 40.0  --  43.7  < > 39.1  --  38.4*  --  31.8* 32.0*  MCV 90.9  --  92.4  --  89.9  --  91.6  --  86.2  --   PLT 344  --  237  < > 253  --  271  --  274  --   < > = values in this interval not displayed.  Recent Labs  03/29/16 0531  CHOL 235*  LDLCALC 167*  TRIG 160*   No results found for: MICROALBUR No results found for: TSH Lab Results  Component Value Date   HGBA1C 11.0 (H) 03/29/2016   Lab Results  Component Value Date   CHOL 235 (H) 03/29/2016   HDL 36 (L) 03/29/2016   LDLCALC 167 (H) 03/29/2016   TRIG 160 (H) 03/29/2016   CHOLHDL 6.5 03/29/2016    Significant Diagnostic Results in last 30 days:  No results found.  Assessment and Plan  No problem-specific Assessment & Plan notes found for this encounter.   Labs/tests ordered:    James Delaine. Sheppard Coil,  MD

## 2016-08-08 ENCOUNTER — Encounter: Payer: Medicare Other | Admitting: Surgery

## 2016-08-08 DIAGNOSIS — M6281 Muscle weakness (generalized): Secondary | ICD-10-CM | POA: Diagnosis not present

## 2016-08-08 DIAGNOSIS — I69351 Hemiplegia and hemiparesis following cerebral infarction affecting right dominant side: Secondary | ICD-10-CM | POA: Diagnosis not present

## 2016-08-08 DIAGNOSIS — R1312 Dysphagia, oropharyngeal phase: Secondary | ICD-10-CM | POA: Diagnosis not present

## 2016-08-08 DIAGNOSIS — R488 Other symbolic dysfunctions: Secondary | ICD-10-CM | POA: Diagnosis not present

## 2016-08-09 DIAGNOSIS — R488 Other symbolic dysfunctions: Secondary | ICD-10-CM | POA: Diagnosis not present

## 2016-08-09 DIAGNOSIS — R1312 Dysphagia, oropharyngeal phase: Secondary | ICD-10-CM | POA: Diagnosis not present

## 2016-08-09 DIAGNOSIS — I69351 Hemiplegia and hemiparesis following cerebral infarction affecting right dominant side: Secondary | ICD-10-CM | POA: Diagnosis not present

## 2016-08-09 DIAGNOSIS — M6281 Muscle weakness (generalized): Secondary | ICD-10-CM | POA: Diagnosis not present

## 2016-08-10 DIAGNOSIS — R488 Other symbolic dysfunctions: Secondary | ICD-10-CM | POA: Diagnosis not present

## 2016-08-10 DIAGNOSIS — M6281 Muscle weakness (generalized): Secondary | ICD-10-CM | POA: Diagnosis not present

## 2016-08-10 DIAGNOSIS — R1312 Dysphagia, oropharyngeal phase: Secondary | ICD-10-CM | POA: Diagnosis not present

## 2016-08-10 DIAGNOSIS — I69351 Hemiplegia and hemiparesis following cerebral infarction affecting right dominant side: Secondary | ICD-10-CM | POA: Diagnosis not present

## 2016-08-11 DIAGNOSIS — M6281 Muscle weakness (generalized): Secondary | ICD-10-CM | POA: Diagnosis not present

## 2016-08-11 DIAGNOSIS — I69351 Hemiplegia and hemiparesis following cerebral infarction affecting right dominant side: Secondary | ICD-10-CM | POA: Diagnosis not present

## 2016-08-11 DIAGNOSIS — R488 Other symbolic dysfunctions: Secondary | ICD-10-CM | POA: Diagnosis not present

## 2016-08-11 DIAGNOSIS — R1312 Dysphagia, oropharyngeal phase: Secondary | ICD-10-CM | POA: Diagnosis not present

## 2016-08-12 ENCOUNTER — Emergency Department (HOSPITAL_COMMUNITY): Payer: Medicare Other

## 2016-08-12 ENCOUNTER — Encounter (HOSPITAL_COMMUNITY): Payer: Self-pay | Admitting: Family Medicine

## 2016-08-12 ENCOUNTER — Inpatient Hospital Stay (HOSPITAL_COMMUNITY)
Admission: EM | Admit: 2016-08-12 | Discharge: 2016-08-27 | DRG: 853 | Disposition: A | Discharge status: Home Health | Payer: Medicare Other | Attending: Internal Medicine | Admitting: Internal Medicine

## 2016-08-12 DIAGNOSIS — M868X6 Other osteomyelitis, lower leg: Secondary | ICD-10-CM | POA: Diagnosis present

## 2016-08-12 DIAGNOSIS — Z4781 Encounter for orthopedic aftercare following surgical amputation: Secondary | ICD-10-CM | POA: Diagnosis not present

## 2016-08-12 DIAGNOSIS — E11649 Type 2 diabetes mellitus with hypoglycemia without coma: Secondary | ICD-10-CM | POA: Diagnosis not present

## 2016-08-12 DIAGNOSIS — I6789 Other cerebrovascular disease: Secondary | ICD-10-CM | POA: Diagnosis present

## 2016-08-12 DIAGNOSIS — IMO0002 Reserved for concepts with insufficient information to code with codable children: Secondary | ICD-10-CM | POA: Diagnosis present

## 2016-08-12 DIAGNOSIS — Z955 Presence of coronary angioplasty implant and graft: Secondary | ICD-10-CM

## 2016-08-12 DIAGNOSIS — R4182 Altered mental status, unspecified: Secondary | ICD-10-CM

## 2016-08-12 DIAGNOSIS — B999 Unspecified infectious disease: Secondary | ICD-10-CM | POA: Diagnosis not present

## 2016-08-12 DIAGNOSIS — A498 Other bacterial infections of unspecified site: Secondary | ICD-10-CM | POA: Diagnosis not present

## 2016-08-12 DIAGNOSIS — I70244 Atherosclerosis of native arteries of left leg with ulceration of heel and midfoot: Secondary | ICD-10-CM | POA: Diagnosis not present

## 2016-08-12 DIAGNOSIS — A4159 Other Gram-negative sepsis: Secondary | ICD-10-CM | POA: Diagnosis not present

## 2016-08-12 DIAGNOSIS — E7800 Pure hypercholesterolemia, unspecified: Secondary | ICD-10-CM | POA: Diagnosis present

## 2016-08-12 DIAGNOSIS — E1159 Type 2 diabetes mellitus with other circulatory complications: Secondary | ICD-10-CM | POA: Diagnosis not present

## 2016-08-12 DIAGNOSIS — Z6833 Body mass index (BMI) 33.0-33.9, adult: Secondary | ICD-10-CM

## 2016-08-12 DIAGNOSIS — Z89612 Acquired absence of left leg above knee: Secondary | ICD-10-CM

## 2016-08-12 DIAGNOSIS — I70262 Atherosclerosis of native arteries of extremities with gangrene, left leg: Secondary | ICD-10-CM | POA: Diagnosis not present

## 2016-08-12 DIAGNOSIS — M869 Osteomyelitis, unspecified: Secondary | ICD-10-CM | POA: Diagnosis not present

## 2016-08-12 DIAGNOSIS — A4181 Sepsis due to Enterococcus: Secondary | ICD-10-CM | POA: Diagnosis present

## 2016-08-12 DIAGNOSIS — I69311 Memory deficit following cerebral infarction: Secondary | ICD-10-CM | POA: Diagnosis not present

## 2016-08-12 DIAGNOSIS — E11621 Type 2 diabetes mellitus with foot ulcer: Secondary | ICD-10-CM | POA: Diagnosis present

## 2016-08-12 DIAGNOSIS — I69391 Dysphagia following cerebral infarction: Secondary | ICD-10-CM

## 2016-08-12 DIAGNOSIS — M86172 Other acute osteomyelitis, left ankle and foot: Secondary | ICD-10-CM | POA: Diagnosis not present

## 2016-08-12 DIAGNOSIS — L97511 Non-pressure chronic ulcer of other part of right foot limited to breakdown of skin: Secondary | ICD-10-CM | POA: Diagnosis not present

## 2016-08-12 DIAGNOSIS — H5462 Unqualified visual loss, left eye, normal vision right eye: Secondary | ICD-10-CM | POA: Diagnosis present

## 2016-08-12 DIAGNOSIS — Z7901 Long term (current) use of anticoagulants: Secondary | ICD-10-CM

## 2016-08-12 DIAGNOSIS — R339 Retention of urine, unspecified: Secondary | ICD-10-CM | POA: Diagnosis present

## 2016-08-12 DIAGNOSIS — I998 Other disorder of circulatory system: Secondary | ICD-10-CM | POA: Diagnosis not present

## 2016-08-12 DIAGNOSIS — I10 Essential (primary) hypertension: Secondary | ICD-10-CM | POA: Diagnosis not present

## 2016-08-12 DIAGNOSIS — N182 Chronic kidney disease, stage 2 (mild): Secondary | ICD-10-CM | POA: Diagnosis present

## 2016-08-12 DIAGNOSIS — L97418 Non-pressure chronic ulcer of right heel and midfoot with other specified severity: Secondary | ICD-10-CM | POA: Diagnosis present

## 2016-08-12 DIAGNOSIS — Z8701 Personal history of pneumonia (recurrent): Secondary | ICD-10-CM | POA: Diagnosis not present

## 2016-08-12 DIAGNOSIS — R7881 Bacteremia: Secondary | ICD-10-CM | POA: Diagnosis present

## 2016-08-12 DIAGNOSIS — I69331 Monoplegia of upper limb following cerebral infarction affecting right dominant side: Secondary | ICD-10-CM

## 2016-08-12 DIAGNOSIS — I2510 Atherosclerotic heart disease of native coronary artery without angina pectoris: Secondary | ICD-10-CM | POA: Diagnosis not present

## 2016-08-12 DIAGNOSIS — E861 Hypovolemia: Secondary | ICD-10-CM | POA: Diagnosis present

## 2016-08-12 DIAGNOSIS — I130 Hypertensive heart and chronic kidney disease with heart failure and stage 1 through stage 4 chronic kidney disease, or unspecified chronic kidney disease: Secondary | ICD-10-CM | POA: Diagnosis present

## 2016-08-12 DIAGNOSIS — A419 Sepsis, unspecified organism: Secondary | ICD-10-CM | POA: Diagnosis present

## 2016-08-12 DIAGNOSIS — G92 Toxic encephalopathy: Secondary | ICD-10-CM | POA: Diagnosis not present

## 2016-08-12 DIAGNOSIS — B952 Enterococcus as the cause of diseases classified elsewhere: Secondary | ICD-10-CM | POA: Diagnosis not present

## 2016-08-12 DIAGNOSIS — Z515 Encounter for palliative care: Secondary | ICD-10-CM | POA: Diagnosis not present

## 2016-08-12 DIAGNOSIS — Z86718 Personal history of other venous thrombosis and embolism: Secondary | ICD-10-CM | POA: Diagnosis not present

## 2016-08-12 DIAGNOSIS — D62 Acute posthemorrhagic anemia: Secondary | ICD-10-CM | POA: Diagnosis not present

## 2016-08-12 DIAGNOSIS — I252 Old myocardial infarction: Secondary | ICD-10-CM | POA: Diagnosis not present

## 2016-08-12 DIAGNOSIS — I70261 Atherosclerosis of native arteries of extremities with gangrene, right leg: Secondary | ICD-10-CM | POA: Diagnosis not present

## 2016-08-12 DIAGNOSIS — E1165 Type 2 diabetes mellitus with hyperglycemia: Secondary | ICD-10-CM | POA: Diagnosis not present

## 2016-08-12 DIAGNOSIS — R4189 Other symptoms and signs involving cognitive functions and awareness: Secondary | ICD-10-CM | POA: Diagnosis not present

## 2016-08-12 DIAGNOSIS — D638 Anemia in other chronic diseases classified elsewhere: Secondary | ICD-10-CM | POA: Diagnosis present

## 2016-08-12 DIAGNOSIS — I110 Hypertensive heart disease with heart failure: Secondary | ICD-10-CM | POA: Diagnosis present

## 2016-08-12 DIAGNOSIS — I5032 Chronic diastolic (congestive) heart failure: Secondary | ICD-10-CM

## 2016-08-12 DIAGNOSIS — Z794 Long term (current) use of insulin: Secondary | ICD-10-CM | POA: Diagnosis not present

## 2016-08-12 DIAGNOSIS — L97428 Non-pressure chronic ulcer of left heel and midfoot with other specified severity: Secondary | ICD-10-CM | POA: Diagnosis present

## 2016-08-12 DIAGNOSIS — R488 Other symbolic dysfunctions: Secondary | ICD-10-CM | POA: Diagnosis not present

## 2016-08-12 DIAGNOSIS — J189 Pneumonia, unspecified organism: Secondary | ICD-10-CM

## 2016-08-12 DIAGNOSIS — Z8673 Personal history of transient ischemic attack (TIA), and cerebral infarction without residual deficits: Secondary | ICD-10-CM | POA: Diagnosis not present

## 2016-08-12 DIAGNOSIS — I69322 Dysarthria following cerebral infarction: Secondary | ICD-10-CM | POA: Diagnosis not present

## 2016-08-12 DIAGNOSIS — Z91041 Radiographic dye allergy status: Secondary | ICD-10-CM | POA: Diagnosis not present

## 2016-08-12 DIAGNOSIS — Z6829 Body mass index (BMI) 29.0-29.9, adult: Secondary | ICD-10-CM | POA: Diagnosis not present

## 2016-08-12 DIAGNOSIS — N179 Acute kidney failure, unspecified: Secondary | ICD-10-CM

## 2016-08-12 DIAGNOSIS — M86171 Other acute osteomyelitis, right ankle and foot: Secondary | ICD-10-CM | POA: Diagnosis not present

## 2016-08-12 DIAGNOSIS — L89142 Pressure ulcer of left lower back, stage 2: Secondary | ICD-10-CM | POA: Diagnosis not present

## 2016-08-12 DIAGNOSIS — L89152 Pressure ulcer of sacral region, stage 2: Secondary | ICD-10-CM | POA: Diagnosis not present

## 2016-08-12 DIAGNOSIS — I771 Stricture of artery: Secondary | ICD-10-CM | POA: Diagnosis present

## 2016-08-12 DIAGNOSIS — L89300 Pressure ulcer of unspecified buttock, unstageable: Secondary | ICD-10-CM | POA: Diagnosis present

## 2016-08-12 DIAGNOSIS — Z452 Encounter for adjustment and management of vascular access device: Secondary | ICD-10-CM | POA: Diagnosis not present

## 2016-08-12 DIAGNOSIS — R1310 Dysphagia, unspecified: Secondary | ICD-10-CM | POA: Diagnosis present

## 2016-08-12 DIAGNOSIS — L89312 Pressure ulcer of right buttock, stage 2: Secondary | ICD-10-CM | POA: Diagnosis not present

## 2016-08-12 DIAGNOSIS — E785 Hyperlipidemia, unspecified: Secondary | ICD-10-CM | POA: Diagnosis present

## 2016-08-12 DIAGNOSIS — E1169 Type 2 diabetes mellitus with other specified complication: Secondary | ICD-10-CM | POA: Diagnosis not present

## 2016-08-12 DIAGNOSIS — I69351 Hemiplegia and hemiparesis following cerebral infarction affecting right dominant side: Secondary | ICD-10-CM | POA: Diagnosis not present

## 2016-08-12 DIAGNOSIS — R918 Other nonspecific abnormal finding of lung field: Secondary | ICD-10-CM | POA: Diagnosis not present

## 2016-08-12 DIAGNOSIS — R4689 Other symptoms and signs involving appearance and behavior: Secondary | ICD-10-CM

## 2016-08-12 DIAGNOSIS — L97319 Non-pressure chronic ulcer of right ankle with unspecified severity: Secondary | ICD-10-CM | POA: Diagnosis not present

## 2016-08-12 DIAGNOSIS — R937 Abnormal findings on diagnostic imaging of other parts of musculoskeletal system: Secondary | ICD-10-CM | POA: Diagnosis not present

## 2016-08-12 DIAGNOSIS — E1142 Type 2 diabetes mellitus with diabetic polyneuropathy: Secondary | ICD-10-CM | POA: Diagnosis not present

## 2016-08-12 DIAGNOSIS — L8942 Pressure ulcer of contiguous site of back, buttock and hip, stage 2: Secondary | ICD-10-CM | POA: Diagnosis not present

## 2016-08-12 DIAGNOSIS — Z833 Family history of diabetes mellitus: Secondary | ICD-10-CM

## 2016-08-12 DIAGNOSIS — E11628 Type 2 diabetes mellitus with other skin complications: Secondary | ICD-10-CM | POA: Diagnosis present

## 2016-08-12 DIAGNOSIS — I69321 Dysphasia following cerebral infarction: Secondary | ICD-10-CM

## 2016-08-12 DIAGNOSIS — M6281 Muscle weakness (generalized): Secondary | ICD-10-CM | POA: Diagnosis not present

## 2016-08-12 DIAGNOSIS — Z8739 Personal history of other diseases of the musculoskeletal system and connective tissue: Secondary | ICD-10-CM | POA: Diagnosis not present

## 2016-08-12 DIAGNOSIS — L97429 Non-pressure chronic ulcer of left heel and midfoot with unspecified severity: Secondary | ICD-10-CM | POA: Diagnosis not present

## 2016-08-12 DIAGNOSIS — Z5181 Encounter for therapeutic drug level monitoring: Secondary | ICD-10-CM | POA: Diagnosis not present

## 2016-08-12 DIAGNOSIS — E1140 Type 2 diabetes mellitus with diabetic neuropathy, unspecified: Secondary | ICD-10-CM | POA: Diagnosis not present

## 2016-08-12 DIAGNOSIS — E1151 Type 2 diabetes mellitus with diabetic peripheral angiopathy without gangrene: Secondary | ICD-10-CM | POA: Diagnosis present

## 2016-08-12 DIAGNOSIS — Z466 Encounter for fitting and adjustment of urinary device: Secondary | ICD-10-CM | POA: Diagnosis not present

## 2016-08-12 DIAGNOSIS — B964 Proteus (mirabilis) (morganii) as the cause of diseases classified elsewhere: Secondary | ICD-10-CM | POA: Diagnosis present

## 2016-08-12 DIAGNOSIS — L97318 Non-pressure chronic ulcer of right ankle with other specified severity: Secondary | ICD-10-CM | POA: Diagnosis present

## 2016-08-12 DIAGNOSIS — M868X7 Other osteomyelitis, ankle and foot: Secondary | ICD-10-CM | POA: Diagnosis not present

## 2016-08-12 DIAGNOSIS — E119 Type 2 diabetes mellitus without complications: Secondary | ICD-10-CM | POA: Diagnosis not present

## 2016-08-12 DIAGNOSIS — R1312 Dysphagia, oropharyngeal phase: Secondary | ICD-10-CM | POA: Diagnosis not present

## 2016-08-12 DIAGNOSIS — I69392 Facial weakness following cerebral infarction: Secondary | ICD-10-CM

## 2016-08-12 DIAGNOSIS — F329 Major depressive disorder, single episode, unspecified: Secondary | ICD-10-CM | POA: Diagnosis present

## 2016-08-12 DIAGNOSIS — T402X5A Adverse effect of other opioids, initial encounter: Secondary | ICD-10-CM | POA: Diagnosis not present

## 2016-08-12 DIAGNOSIS — R509 Fever, unspecified: Secondary | ICD-10-CM

## 2016-08-12 DIAGNOSIS — B9620 Unspecified Escherichia coli [E. coli] as the cause of diseases classified elsewhere: Secondary | ICD-10-CM | POA: Diagnosis present

## 2016-08-12 DIAGNOSIS — R6520 Severe sepsis without septic shock: Secondary | ICD-10-CM | POA: Diagnosis present

## 2016-08-12 DIAGNOSIS — Z89611 Acquired absence of right leg above knee: Secondary | ICD-10-CM

## 2016-08-12 DIAGNOSIS — Z7189 Other specified counseling: Secondary | ICD-10-CM

## 2016-08-12 DIAGNOSIS — M86272 Subacute osteomyelitis, left ankle and foot: Secondary | ICD-10-CM | POA: Diagnosis not present

## 2016-08-12 DIAGNOSIS — Z79899 Other long term (current) drug therapy: Secondary | ICD-10-CM

## 2016-08-12 DIAGNOSIS — I70234 Atherosclerosis of native arteries of right leg with ulceration of heel and midfoot: Secondary | ICD-10-CM | POA: Diagnosis not present

## 2016-08-12 DIAGNOSIS — L97529 Non-pressure chronic ulcer of other part of left foot with unspecified severity: Secondary | ICD-10-CM | POA: Diagnosis not present

## 2016-08-12 DIAGNOSIS — G629 Polyneuropathy, unspecified: Secondary | ICD-10-CM

## 2016-08-12 DIAGNOSIS — E1122 Type 2 diabetes mellitus with diabetic chronic kidney disease: Secondary | ICD-10-CM | POA: Diagnosis present

## 2016-08-12 DIAGNOSIS — Z792 Long term (current) use of antibiotics: Secondary | ICD-10-CM | POA: Diagnosis not present

## 2016-08-12 DIAGNOSIS — Z803 Family history of malignant neoplasm of breast: Secondary | ICD-10-CM

## 2016-08-12 DIAGNOSIS — Z8249 Family history of ischemic heart disease and other diseases of the circulatory system: Secondary | ICD-10-CM

## 2016-08-12 LAB — COMPREHENSIVE METABOLIC PANEL
ALBUMIN: 2.5 g/dL — AB (ref 3.5–5.0)
ALK PHOS: 75 U/L (ref 38–126)
ALT: 17 U/L (ref 17–63)
AST: 16 U/L (ref 15–41)
Anion gap: 9 (ref 5–15)
BUN: 22 mg/dL — AB (ref 6–20)
CHLORIDE: 100 mmol/L — AB (ref 101–111)
CO2: 26 mmol/L (ref 22–32)
CREATININE: 1.33 mg/dL — AB (ref 0.61–1.24)
Calcium: 8.4 mg/dL — ABNORMAL LOW (ref 8.9–10.3)
GFR calc Af Amer: >60 mL/min (ref >60)
GFR calc non Af Amer: 57 mL/min — ABNORMAL LOW (ref >60)
GLUCOSE: 313 mg/dL — AB (ref 65–99)
Potassium: 3.6 mmol/L (ref 3.5–5.1)
Sodium: 135 mmol/L (ref 135–145)
Total Bilirubin: 0.3 mg/dL (ref 0.3–1.2)
Total Protein: 7.5 g/dL (ref 6.5–8.1)

## 2016-08-12 LAB — CBC WITH DIFFERENTIAL/PLATELET
BASOS PCT: 0 %
Basophils Absolute: 0 10*3/uL (ref 0.0–0.1)
EOS ABS: 0 10*3/uL (ref 0.0–0.7)
Eosinophils Relative: 0 %
HCT: 30.5 % — ABNORMAL LOW (ref 39.0–52.0)
Hemoglobin: 9.8 g/dL — ABNORMAL LOW (ref 13.0–17.0)
LYMPHS ABS: 1.1 10*3/uL (ref 0.7–4.0)
Lymphocytes Relative: 13 %
MCH: 27.1 pg (ref 26.0–34.0)
MCHC: 32.1 g/dL (ref 30.0–36.0)
MCV: 84.5 fL (ref 78.0–100.0)
MONO ABS: 0.6 10*3/uL (ref 0.1–1.0)
MONOS PCT: 7 %
NEUTROS PCT: 80 %
Neutro Abs: 6.9 10*3/uL (ref 1.7–7.7)
Platelets: 263 10*3/uL (ref 150–400)
RBC: 3.61 MIL/uL — ABNORMAL LOW (ref 4.22–5.81)
RDW: 13.9 % (ref 11.5–15.5)
WBC: 8.7 10*3/uL (ref 4.0–10.5)

## 2016-08-12 LAB — GLUCOSE, CAPILLARY: Glucose-Capillary: 253 mg/dL — ABNORMAL HIGH (ref 65–99)

## 2016-08-12 LAB — I-STAT CG4 LACTIC ACID, ED: LACTIC ACID, VENOUS: 1.3 mmol/L (ref 0.5–1.9)

## 2016-08-12 LAB — CBG MONITORING, ED: GLUCOSE-CAPILLARY: 271 mg/dL — AB (ref 65–99)

## 2016-08-12 MED ORDER — ONDANSETRON HCL 4 MG PO TABS: 4.0000 mg | ORAL_TABLET | Freq: Four times a day (QID) | ORAL | Status: DC | PRN

## 2016-08-12 MED ORDER — CARVEDILOL 25 MG PO TABS
25.0000 mg | ORAL_TABLET | Freq: Two times a day (BID) | ORAL | Status: DC
  Administered 2016-08-13 – 2016-08-27 (×20): 25 mg via ORAL
  Filled 2016-08-12 (×25): qty 1

## 2016-08-12 MED ORDER — AMLODIPINE BESYLATE 10 MG PO TABS
10.0000 mg | ORAL_TABLET | Freq: Every day | ORAL | Status: DC
  Administered 2016-08-13 – 2016-08-27 (×14): 10 mg via ORAL
  Filled 2016-08-12: qty 2
  Filled 2016-08-12 (×4): qty 1
  Filled 2016-08-12 (×2): qty 2
  Filled 2016-08-12 (×8): qty 1

## 2016-08-12 MED ORDER — VANCOMYCIN HCL IN DEXTROSE 1-5 GM/200ML-% IV SOLN: 1000.0000 mg | Freq: Once | INTRAVENOUS | Status: DC

## 2016-08-12 MED ORDER — MIRTAZAPINE 15 MG PO TABS
7.5000 mg | ORAL_TABLET | Freq: Every day | ORAL | Status: DC
  Administered 2016-08-12 – 2016-08-26 (×14): 7.5 mg via ORAL
  Filled 2016-08-12 (×15): qty 1

## 2016-08-12 MED ORDER — QUETIAPINE FUMARATE ER 50 MG PO TB24
50.0000 mg | ORAL_TABLET | Freq: Every day | ORAL | Status: DC
  Administered 2016-08-13 – 2016-08-27 (×12): 50 mg via ORAL
  Filled 2016-08-12 (×15): qty 1

## 2016-08-12 MED ORDER — ACETAMINOPHEN 650 MG RE SUPP
650.0000 mg | Freq: Four times a day (QID) | RECTAL | Status: DC | PRN
  Administered 2016-08-18: 650 mg via RECTAL
  Filled 2016-08-12: qty 1

## 2016-08-12 MED ORDER — VANCOMYCIN HCL 10 G IV SOLR
1250.0000 mg | INTRAVENOUS | Status: DC
  Administered 2016-08-13: 1250 mg via INTRAVENOUS
  Filled 2016-08-12: qty 1250

## 2016-08-12 MED ORDER — KETOROLAC TROMETHAMINE 30 MG/ML IJ SOLN
30.0000 mg | Freq: Once | INTRAMUSCULAR | Status: AC
  Administered 2016-08-12: 30 mg via INTRAVENOUS
  Filled 2016-08-12: qty 1

## 2016-08-12 MED ORDER — PIPERACILLIN-TAZOBACTAM 3.375 G IVPB
3.3750 g | Freq: Three times a day (TID) | INTRAVENOUS | Status: DC
  Filled 2016-08-12 (×2): qty 50

## 2016-08-12 MED ORDER — FLUOXETINE HCL 10 MG PO CAPS
10.0000 mg | ORAL_CAPSULE | Freq: Every day | ORAL | Status: DC
  Administered 2016-08-13 – 2016-08-27 (×11): 10 mg via ORAL
  Filled 2016-08-12 (×15): qty 1

## 2016-08-12 MED ORDER — METRONIDAZOLE IN NACL 5-0.79 MG/ML-% IV SOLN
500.0000 mg | Freq: Three times a day (TID) | INTRAVENOUS | Status: DC
  Administered 2016-08-12 – 2016-08-17 (×12): 500 mg via INTRAVENOUS
  Filled 2016-08-12 (×15): qty 100

## 2016-08-12 MED ORDER — PRO-STAT SUGAR FREE PO LIQD
30.0000 mL | Freq: Two times a day (BID) | ORAL | Status: DC
  Administered 2016-08-13 – 2016-08-27 (×12): 30 mL via ORAL
  Filled 2016-08-12 (×14): qty 30

## 2016-08-12 MED ORDER — GLUCERNA SHAKE PO LIQD
237.0000 mL | Freq: Four times a day (QID) | ORAL | Status: DC
  Administered 2016-08-13 – 2016-08-27 (×18): 237 mL via ORAL
  Filled 2016-08-12 (×7): qty 237

## 2016-08-12 MED ORDER — LIDOCAINE HCL 2 % EX GEL: CUTANEOUS | Status: DC | PRN

## 2016-08-12 MED ORDER — RIVAROXABAN 20 MG PO TABS
20.0000 mg | ORAL_TABLET | Freq: Every day | ORAL | Status: DC
  Administered 2016-08-12: 20 mg via ORAL
  Filled 2016-08-12: qty 1

## 2016-08-12 MED ORDER — POLYETHYLENE GLYCOL 3350 17 G PO PACK
17.0000 g | PACK | Freq: Every day | ORAL | Status: DC
  Administered 2016-08-13 – 2016-08-27 (×6): 17 g via ORAL
  Filled 2016-08-12 (×10): qty 1

## 2016-08-12 MED ORDER — FLEET ENEMA 7-19 GM/118ML RE ENEM: 1.0000 | ENEMA | Freq: Once | RECTAL | Status: DC | PRN

## 2016-08-12 MED ORDER — HYDROCODONE-ACETAMINOPHEN 5-325 MG PO TABS
1.0000 | ORAL_TABLET | ORAL | Status: DC | PRN
  Administered 2016-08-13 (×2): 1 via ORAL
  Administered 2016-08-14: 2 via ORAL
  Administered 2016-08-14: 1 via ORAL
  Administered 2016-08-15 – 2016-08-16 (×2): 2 via ORAL
  Filled 2016-08-12: qty 2
  Filled 2016-08-12: qty 1
  Filled 2016-08-12 (×4): qty 2

## 2016-08-12 MED ORDER — ZINC SULFATE 220 (50 ZN) MG PO CAPS
220.0000 mg | ORAL_CAPSULE | Freq: Every day | ORAL | Status: DC
  Administered 2016-08-13 – 2016-08-27 (×10): 220 mg via ORAL
  Filled 2016-08-12 (×13): qty 1

## 2016-08-12 MED ORDER — BETHANECHOL CHLORIDE 10 MG PO TABS
10.0000 mg | ORAL_TABLET | Freq: Three times a day (TID) | ORAL | Status: DC
  Administered 2016-08-13 – 2016-08-27 (×33): 10 mg via ORAL
  Filled 2016-08-12 (×43): qty 1

## 2016-08-12 MED ORDER — ALBUTEROL SULFATE (2.5 MG/3ML) 0.083% IN NEBU: 2.5000 mg | INHALATION_SOLUTION | RESPIRATORY_TRACT | Status: DC | PRN

## 2016-08-12 MED ORDER — VANCOMYCIN HCL 10 G IV SOLR
2000.0000 mg | Freq: Once | INTRAVENOUS | Status: AC
  Administered 2016-08-12: 2000 mg via INTRAVENOUS
  Filled 2016-08-12: qty 2000

## 2016-08-12 MED ORDER — IBUPROFEN 800 MG PO TABS: 800.0000 mg | ORAL_TABLET | Freq: Once | ORAL | Status: DC

## 2016-08-12 MED ORDER — BISACODYL 10 MG RE SUPP: 10.0000 mg | RECTAL | Status: DC | PRN

## 2016-08-12 MED ORDER — ONDANSETRON HCL 4 MG/2ML IJ SOLN: 4.0000 mg | Freq: Four times a day (QID) | INTRAMUSCULAR | Status: DC | PRN

## 2016-08-12 MED ORDER — HYDROCORTISONE 2.5 % RE CREA: TOPICAL_CREAM | Freq: Two times a day (BID) | RECTAL | Status: DC | PRN

## 2016-08-12 MED ORDER — CLONIDINE HCL 0.2 MG PO TABS
0.3000 mg | ORAL_TABLET | Freq: Three times a day (TID) | ORAL | Status: DC
  Administered 2016-08-12 – 2016-08-27 (×35): 0.3 mg via ORAL
  Filled 2016-08-12: qty 3
  Filled 2016-08-12 (×8): qty 1
  Filled 2016-08-12: qty 3
  Filled 2016-08-12 (×2): qty 1
  Filled 2016-08-12: qty 3
  Filled 2016-08-12 (×18): qty 1
  Filled 2016-08-12: qty 3
  Filled 2016-08-12 (×2): qty 1
  Filled 2016-08-12: qty 3
  Filled 2016-08-12: qty 1
  Filled 2016-08-12: qty 3
  Filled 2016-08-12 (×2): qty 1

## 2016-08-12 MED ORDER — SENNOSIDES-DOCUSATE SODIUM 8.6-50 MG PO TABS
1.0000 | ORAL_TABLET | Freq: Every day | ORAL | Status: DC
  Administered 2016-08-13 – 2016-08-27 (×9): 1 via ORAL
  Filled 2016-08-12 (×12): qty 1

## 2016-08-12 MED ORDER — DEXTROSE 5 % IV SOLN
2.0000 g | INTRAVENOUS | Status: DC
  Administered 2016-08-13 – 2016-08-16 (×5): 2 g via INTRAVENOUS
  Filled 2016-08-12 (×5): qty 2

## 2016-08-12 MED ORDER — TAMSULOSIN HCL 0.4 MG PO CAPS
0.4000 mg | ORAL_CAPSULE | Freq: Every day | ORAL | Status: DC
  Administered 2016-08-13 – 2016-08-25 (×9): 0.4 mg via ORAL
  Filled 2016-08-12 (×12): qty 1

## 2016-08-12 MED ORDER — INSULIN GLARGINE 100 UNIT/ML ~~LOC~~ SOLN
60.0000 [IU] | Freq: Every day | SUBCUTANEOUS | Status: DC
  Administered 2016-08-12 – 2016-08-21 (×8): 60 [IU] via SUBCUTANEOUS
  Filled 2016-08-12 (×13): qty 0.6

## 2016-08-12 MED ORDER — ISOSORB DINITRATE-HYDRALAZINE 20-37.5 MG PO TABS
2.0000 | ORAL_TABLET | Freq: Three times a day (TID) | ORAL | Status: DC
  Administered 2016-08-12 – 2016-08-27 (×34): 2 via ORAL
  Filled 2016-08-12 (×40): qty 2

## 2016-08-12 MED ORDER — INSULIN ASPART 100 UNIT/ML ~~LOC~~ SOLN
0.0000 [IU] | Freq: Three times a day (TID) | SUBCUTANEOUS | Status: DC
  Administered 2016-08-13 (×2): 5 [IU] via SUBCUTANEOUS
  Administered 2016-08-14: 3 [IU] via SUBCUTANEOUS
  Administered 2016-08-14: 2 [IU] via SUBCUTANEOUS
  Administered 2016-08-15: 8 [IU] via SUBCUTANEOUS
  Administered 2016-08-15: 3 [IU] via SUBCUTANEOUS
  Administered 2016-08-15: 5 [IU] via SUBCUTANEOUS
  Administered 2016-08-16 – 2016-08-17 (×2): 3 [IU] via SUBCUTANEOUS
  Administered 2016-08-17: 2 [IU] via SUBCUTANEOUS
  Administered 2016-08-19 (×3): 5 [IU] via SUBCUTANEOUS
  Administered 2016-08-20: 3 [IU] via SUBCUTANEOUS
  Administered 2016-08-24: 5 [IU] via SUBCUTANEOUS

## 2016-08-12 MED ORDER — SODIUM CHLORIDE 0.9 % IV BOLUS (SEPSIS)
1000.0000 mL | Freq: Once | INTRAVENOUS | Status: AC
  Administered 2016-08-12: 1000 mL via INTRAVENOUS

## 2016-08-12 MED ORDER — ACETAMINOPHEN 325 MG PO TABS
650.0000 mg | ORAL_TABLET | Freq: Four times a day (QID) | ORAL | Status: DC | PRN
  Administered 2016-08-13 – 2016-08-14 (×3): 650 mg via ORAL
  Filled 2016-08-12 (×3): qty 2

## 2016-08-12 MED ORDER — SODIUM CHLORIDE 0.9 % IV SOLN
INTRAVENOUS | Status: AC
  Administered 2016-08-12: 20:00:00 via INTRAVENOUS

## 2016-08-12 MED ORDER — INSULIN ASPART 100 UNIT/ML ~~LOC~~ SOLN
3.0000 [IU] | Freq: Three times a day (TID) | SUBCUTANEOUS | Status: DC
  Administered 2016-08-13 – 2016-08-15 (×3): 3 [IU] via SUBCUTANEOUS

## 2016-08-12 MED ORDER — PIPERACILLIN-TAZOBACTAM 3.375 G IVPB 30 MIN
3.3750 g | Freq: Once | INTRAVENOUS | Status: AC
  Administered 2016-08-12: 3.375 g via INTRAVENOUS
  Filled 2016-08-12: qty 50

## 2016-08-12 MED ORDER — ATORVASTATIN CALCIUM 40 MG PO TABS
40.0000 mg | ORAL_TABLET | Freq: Every day | ORAL | Status: DC
  Administered 2016-08-13 – 2016-08-25 (×9): 40 mg via ORAL
  Filled 2016-08-12 (×11): qty 1

## 2016-08-12 MED ORDER — INSULIN ASPART 100 UNIT/ML ~~LOC~~ SOLN
0.0000 [IU] | Freq: Every day | SUBCUTANEOUS | Status: DC
  Administered 2016-08-12 – 2016-08-13 (×2): 3 [IU] via SUBCUTANEOUS
  Administered 2016-08-15: 4 [IU] via SUBCUTANEOUS
  Administered 2016-08-24: 5 [IU] via SUBCUTANEOUS

## 2016-08-12 NOTE — ED Notes (Signed)
Bed: WA04 Expected date:  Expected time:  Means of arrival:  Comments: EMS fever  

## 2016-08-12 NOTE — ED Provider Notes (Signed)
[facility] DEPT Provider Note   CSN: [id number] Arrival date & time: 08/12/16  1655     History   Chief Complaint Chief Complaint  Patient presents with  . Fever    HPI [name] is a 61 y.o. male with a past medical history of adrenal insufficiency, CHF, diabetes, and previous MI, peripheral vascular disease, history of CVA with right hemiplegia. He is currently in a skilled nursing facility. Patient was sent over from the nursing facility for fever. He was given a rectal suppository of Tylenol prior to arrival and arrives with a temperature of 103.3 Fahrenheit. The patient has baseline confusion and there is a level V caveat. He is currently being treated for heel ulcerations. He complains of pain in his feet. He is unable to tolerate whether this is new or old.  HPI  Past Medical History:  Diagnosis Date  . Adrenal insufficiency ([place])   . Allergy   . Arthritis   . Blind left eye   . Carpal tunnel syndrome   . Cataract    bil removed  . CHF (congestive heart failure) ([place])   . Diabetes mellitus without complication ([place])   . ED (erectile dysfunction)   . Gout   . Herpes   . Hypercholesterolemia   . Hypertension   . MI (myocardial infarction) ([place])   . Nausea & vomiting   . Peripheral neuropathy   . Pneumonia   . PVD (peripheral vascular disease) [facility]     Patient Active Problem List   Diagnosis Date Noted  . Acute respiratory failure with hypoxia ([place]) 05/15/2016  . HLD (hyperlipidemia) 05/15/2016  . Nausea and vomiting   . Hypoxia 05/10/2016  . Elevated troponin 05/10/2016  . AKI (acute kidney injury) ([place]) 05/10/2016  . Pressure injury of skin 05/10/2016  . CKD (chronic kidney disease) stage 3, GFR 30-59 ml/min 04/28/2016  . Hypernatremia 04/28/2016  . Dysphagia due to recent stroke 04/28/2016  . Cystitis due to Pseudomonas 04/28/2016  . Enterococcus UTI 04/28/2016  . Hyperlipidemia associated with type 2 diabetes mellitus ([place]) 04/28/2016  .  Depression 04/28/2016  . Urinary retention   . Accidental fall from chair   . FUO (fever of unknown origin)   . Cognitive and behavioral changes   . Cerebrovascular accident (CVA) due to embolism of left posterior cerebral artery ([place])   . Lethargy   . Acute on chronic renal insufficiency   . Low back pain   . Leukocytosis   . Labile blood glucose   . Hypoglycemia due to insulin   . UTI due to Klebsiella species   . Deep tissue injury   . Renovascular hypertension   . History of DVT (deep vein thrombosis)   . Coronary artery disease involving native coronary artery of native heart without angina pectoris   . Morbid obesity ([place])   . Stage 2 chronic kidney disease   . Hypokalemia   . Acute blood loss anemia   . Embolic stroke involving posterior cerebral artery ([place]) 04/03/2016  . Brainstem stroke ([place]) 04/01/2016  . Abnormality of gait and mobility   . Cerebral infarction due to thrombosis of other cerebral artery ([place])   . Right arm numbness   . Left sided numbness 03/29/2016  . TIA (transient ischemic attack) 03/29/2016  . Hypertensive urgency 03/29/2016  . Sepsis ([place]) 09/10/2013  . DVT (deep venous thrombosis) ([place]) 01/01/2013  . Rash and nonspecific skin eruption 12/29/2012  . Cellulitis and abscess of leg 12/29/2012  . Diabetes  type 2, uncontrolled ([place]) 12/29/2012  . Hypertension   . Cardiomyopathy ([place])   . CHF (congestive heart failure) ([place])   . Peripheral neuropathy   . Special screening for malignant neoplasms, colon 12/09/2012    Past Surgical History:  Procedure Laterality Date  . ABDOMINAL AORTOGRAM W/LOWER EXTREMITY N/A 06/19/2016   Procedure: Abdominal Aortogram w/Lower Extremity;  Surgeon: Serafina Mitchell, MD;  Location: [facility] CV LAB;  Service: Cardiovascular;  Laterality: N/A;  . CHOLECYSTECTOMY    . CORONARY ANGIOPLASTY WITH STENT PLACEMENT  2007   two 2.5  x 13 mm Cypher stents to the RCA  . dilatera cataracts removed    . right knee surgary          Home Medications    Prior to Admission medications   Medication Sig Start Date End Date Taking? Authorizing Provider  acetaminophen (TYLENOL) 160 MG/5ML suspension Take 650 mg by mouth as needed for mild pain. 20.3 ml ( 650 mg total)     [provider]  albuterol (PROVENTIL) (2.5 MG/3ML) 0.083% nebulizer solution Take 3 mLs (2.5 mg total) by nebulization every 2 (two) hours as needed for shortness of breath. 05/12/16   Rai, Ripudeep Raliegh Ip, MD  Amino Acids-Protein Hydrolys (FEEDING SUPPLEMENT, PRO-STAT SUGAR FREE 64,) LIQD Take 30 mLs by mouth 2 (two) times daily.    [provider]  amLODipine (NORVASC) 10 MG tablet Take 1 tablet (10 mg total) by mouth daily. 04/25/16   Garis, Ivan Anchors, PA-C  atorvastatin (LIPITOR) 40 MG tablet Take 1 tablet (40 mg total) by mouth daily at 6 PM. 04/03/16   Donne Hazel, MD  bethanechol (URECHOLINE) 10 MG tablet Take 1 tablet (10 mg total) by mouth 3 (three) times daily. 04/25/16   [place], Ivan Anchors, PA-C  bisacodyl (DULCOLAX) 10 MG suppository Place 10 mg rectally as needed for moderate constipation.    [provider]  carvedilol (COREG) 25 MG tablet Take 1 tablet (25 mg total) by mouth 2 (two) times daily with a meal. 04/03/16   Donne Hazel, MD  cloNIDine (CATAPRES) 0.3 MG tablet Take 1 tablet (0.3 mg total) by mouth 3 (three) times daily. 04/03/16   Donne Hazel, MD  FLUoxetine (PROZAC) 10 MG capsule Take 1 capsule (10 mg total) by mouth daily. 04/25/16   Kronick, Ivan Anchors, PA-C  furosemide (LASIX) 40 MG tablet Take 40 mg by mouth daily as needed for edema.    [provider]  GLUCERNA (GLUCERNA) LIQD Take 237 mLs by mouth 4 (four) times daily.    [provider]  hydrocerin (EUCERIN) CREA Apply 1 application topically 2 (two) times daily. 04/25/16   Geraldo, Ivan Anchors, PA-C  hydrocortisone (ANUSOL-HC) 2.5 % rectal cream Place rectally 2 (two) times daily as needed for hemorrhoids or itching. 04/25/16   [place], Ivan Anchors,  PA-C  insulin aspart (NOVOLOG) 100 UNIT/ML FlexPen Inject into the skin 3 (three) times daily with meals. CBG with meals and SSI as follows less than 150 no insulin, 151 - 200 = 3 units, 201 - 250 = 5 units, 251 - 300 = 8 units, 301 - 350 = 11 units, 351 - 400 = 15 units, greater than 400 give 15 units and call MD    [provider]  Insulin Glargine (TOUJEO SOLOSTAR) 300 UNIT/ML SOPN Inject 60 Units into the skin at bedtime.    [provider]  isosorbide-hydrALAZINE (BIDIL) 20-37.5 MG tablet Take 2 tablets by mouth 3 (three) times  daily. 04/03/16   Donne Hazel, MD  lidocaine (XYLOCAINE) 2 % jelly Apply topically as needed (Use with in and out catheter). 04/25/16   Younes, Ivan Anchors, PA-C  mirtazapine (REMERON) 7.5 MG tablet Take 1 tablet (7.5 mg total) by mouth at bedtime. 04/25/16   Orf, Ivan Anchors, PA-C  Multiple Vitamins-Minerals (MULTIVITAMIN PO) Take 1 tablet by mouth daily.    [provider]  nitroGLYCERIN (NITROSTAT) 0.4 MG SL tablet Place 1 tablet (0.4 mg total) under the tongue every 5 (five) minutes as needed for chest pain. 05/12/16   Rai, Ripudeep K, MD  ondansetron (ZOFRAN-ODT) 4 MG disintegrating tablet Take 4 mg by mouth every 6 (six) hours as needed for nausea or vomiting.     [provider]  polyethylene glycol (MIRALAX / GLYCOLAX) packet Take 17 g by mouth daily. 04/25/16   Leazer, Ivan Anchors, PA-C  QUETIAPINE FUMARATE PO Take by mouth.    [provider]  rivaroxaban (XARELTO) 20 MG TABS tablet Take 1 tablet (20 mg total) by mouth daily with supper. 04/25/16   Ragle, Ivan Anchors, PA-C  senna-docusate (SENOKOT-S) 8.6-50 MG tablet Take 1 tablet by mouth daily. 04/04/16   Donne Hazel, MD  Sodium Phosphates (RA SALINE ENEMA) 19-7 GM/118ML ENEM Place 1 each rectally as needed (for constipation).    [provider]  tamsulosin (FLOMAX) 0.4 MG CAPS capsule Take 1 capsule (0.4 mg total) by mouth daily after supper. 04/25/16   Bary Leriche, PA-C    zinc sulfate 220 (50 Zn) MG capsule Take 220 mg by mouth daily.    [provider]    Family History Family History  Problem Relation Age of Onset  . Breast cancer Mother   . Hypertension Father        blood clot in leg  . Diabetes Maternal Aunt   . Heart attack Sister   . Colon cancer Neg Hx   . Esophageal cancer Neg Hx   . Rectal cancer Neg Hx   . Stomach cancer Neg Hx     Social History Social History  Substance Use Topics  . Smoking status: Never Smoker  . Smokeless tobacco: Never Used  . Alcohol use No     Allergies   Iohexol and Ivp dye [iodinated diagnostic agents]   Review of Systems Review of Systems  Ten systems reviewed and are negative for acute change, except as noted in the HPI.    Physical Exam Updated Vital Signs BP 122/64 (BP Location: Left Arm)   Pulse (!) 101   Temp (!) 103.3 F (39.6 C) (Oral)   Resp 20   Ht 5\' 9"  (1.753 m)   Wt 104.3 kg (230 lb)   SpO2 92%   BMI 33.97 kg/m   Physical Exam  Constitutional: He appears well-developed and well-nourished. No distress.  Hot to touch  HENT:  Head: Normocephalic and atraumatic.  Eyes: Conjunctivae and EOM are normal. Pupils are equal, round, and reactive to light. No scleral icterus.  Neck: Normal range of motion. Neck supple.  Cardiovascular: Normal rate, regular rhythm and normal heart sounds.   Pulmonary/Chest: Effort normal and breath sounds normal. No respiratory distress.  Abdominal: Soft. Bowel sounds are normal. He exhibits no distension. There is no tenderness.  Musculoskeletal: He exhibits no edema.  BL Heel Ulcerations . There is tissue breakdown on the left heel with a large black eschar over the calcaneus. The right leg shows a large circular area of tissue maceration just  proximal to the Achilles tendon. Bilateral legs are exquisitely tender to touch. There is mild erythema.  Neurological: He is alert.  Skin: Skin is warm and dry. He is not diaphoretic.  Psychiatric:  His behavior is normal.  Nursing note and vitals reviewed.    ED Treatments / Results  Labs (all labs ordered are listed, but only abnormal results are displayed) Labs Reviewed  CULTURE, BLOOD (ROUTINE X 2)  CULTURE, BLOOD (ROUTINE X 2)  COMPREHENSIVE METABOLIC PANEL  CBC WITH DIFFERENTIAL/PLATELET  URINALYSIS, ROUTINE W REFLEX MICROSCOPIC  I-STAT CG4 LACTIC ACID, ED  CBG MONITORING, ED    EKG  EKG Interpretation None       Radiology No results found.  Procedures Procedures (including critical care time)  Medications Ordered in ED Medications  sodium chloride 0.9 % bolus 1,000 mL (1,000 mLs Intravenous New Bag/Given 08/12/16 1732)     Initial Impression / Assessment and Plan / ED Course  I have reviewed the triage vital signs and the nursing notes.  Pertinent labs & imaging results that were available during my care of the patient were reviewed by me and considered in my medical decision making (see chart for details).  Clinical Course as of Aug 13 1907  Sun Aug 12, 2016  1843 Patient with healthcare acquired pneumonia, bilateral.  Possibly this is also aspiration pneumonia. Given the patient's history of dysphagia. Imaging of the right foot shows suspicion of osteomyelitis of the right lateral malleolus. The patient has been started on vancomycin and Zosyn for potential sepsis. He is given fluids. However, he is hemodynamically stable and he is not gaining weight-based dosing. Lactic acid is within normal limits. He remains febrile.   [AH]    Clinical Course User Index [AH] Margarita Mail, PA-C    Patient admitted by Dr. Myna Hidalgo. Pt stable in ED with no significant deterioration in condition.   Final Clinical Impressions(s) / ED Diagnoses   Final diagnoses:  None    New Prescriptions New Prescriptions   No medications on file     Margarita Mail, PA-C 08/25/16 [place], [place], MD 08/26/16 [phone]

## 2016-08-12 NOTE — Progress Notes (Signed)
Pharmacy Antibiotic Note  [name] is a 61 y.o. male admitted on 08/12/2016 from facility with sepsis.  Pharmacy has been consulted for vancomycin and zosyn dosing.  Plan: Vancomycin 2g IV x 1 then 1250 mg IV q24h. Normalized CrCl~59 ml/min/1.14m2. Zosyn 3.375g IV q8h (4 hour infusion time).  Daily SCr.  Height: 5\' 9"  (175.3 cm) Weight: 230 lb (104.3 kg) IBW/kg (Calculated) : 70.7  Temp (24hrs), Avg:103.3 F (39.6 C), Min:103.3 F (39.6 C), Max:103.3 F (39.6 C)   Recent Labs Lab 08/12/16 1724 08/12/16 1731  WBC 8.7  --   LATICACIDVEN  --  1.30    CrCl cannot be calculated (Patient's most recent lab result is older than the maximum 21 days allowed.).    Allergies  Allergen Reactions  . Iohexol Hives  . Ivp Dye [Iodinated Diagnostic Agents] Hives    Antimicrobials this admission: 6/17 vancomycin >>  6/17 zosyn >>   Dose adjustments this admission:   Microbiology results: 6/17 BCx:   Thank you for allowing pharmacy to be a part of this patient's care.  Hershal Coria 08/12/2016 6:02 PM

## 2016-08-12 NOTE — ED Triage Notes (Signed)
Pt bib PTAR from [facility] and presents with a fever. Staff noticed fever after 1pm and pt felt hot.  Staff reported a fever of 103.7 orally. Pt was given a 500mg  tylenol suppsitory at around 14:50.  Pt's baseline is confusion and that hasn't changed from baseline.  Pt has healing wounds on both feet.  PTAR VS: BP: 144/66, HR: 89, RR: 24, 96%RA

## 2016-08-12 NOTE — H&P (Signed)
History and Physical    [name] FGH:[id number] DOB: [birth year] DOA: 08/12/2016  PCP: Maury Dus, MD   Patient coming from: SNF   Chief Complaint: Fever  HPI: [name] is a 61 y.o. male with medical history significant for insulin-dependent diabetes mellitus, hypertension, chronic diastolic CHF, history of DVT, CAD with stents, peripheral arterial disease, and history of CVA with residual dysarthria, dysphagia, and weakness, now presenting from his SNF for evaluation of fevers. Patient had reportedly been in his usual state of health until he was noted to have a temperature of 103.7 F this afternoon at the nursing home. He was treated with Tylenol and EMS was called for transport to the hospital. There has not been any recent cough, vomiting, or diarrhea. Patient has not been voicing any specific complaints. He has been receiving wound care for bilateral foot ulcers. He has been following with vascular surgery in the outpatient setting for peripheral arterial disease and nonhealing foot wounds bilaterally. He is accompanied by his daughter who assists with the history. Aside from Tylenol, no other interventions were attempted prior to coming into the ED.  ED Course: Upon arrival to the ED, patient is found to be febrile to 39.6 C, saturating adequately on room air, mildly tachycardic, and was stable blood pressure. Chest x-ray demonstrates cardiomegaly and mild pulmonary vascular congestion, as well as mild patchy linear opacities in both bases suspected to represent atelectasis. Chemistry panels notable for a glucose of 313 and serum creatinine 1.34, up from 0.90 six weeks earlier. CBC features a normocytic anemia with hemoglobin 9.8, down a half gram from last April, and down more than 2 g since last March. Lactic acid is reassuring at 1.30. Radiographs of the left foot demonstrate the soft tissue ulcer without any underlying bony abnormality, but radiographs of the right foot are  concerning for osteomyelitis involving the right heel. Patient was given a liter of normal saline in the ED, blood cultures were obtained, and he was started on empiric vancomycin and Zosyn. Tachycardia resolved with the IV fluids, blood pressure remained stable, and the patient will be admitted to the medical/surgical unit for ongoing evaluation and management of fever suspected secondary to right foot osteomyelitis in a diabetic, complicated by comorbid PAD.  Review of Systems:  All other systems reviewed and apart from HPI, are negative.  Past Medical History:  Diagnosis Date  . Adrenal insufficiency ([place])   . Allergy   . Arthritis   . Blind left eye   . Carpal tunnel syndrome   . Cataract    bil removed  . CHF (congestive heart failure) ([place])   . Diabetes mellitus without complication ([place])   . ED (erectile dysfunction)   . Gout   . Herpes   . Hypercholesterolemia   . Hypertension   . MI (myocardial infarction) ([place])   . Nausea & vomiting   . Peripheral neuropathy   . Pneumonia   . PVD (peripheral vascular disease) ([place])     Past Surgical History:  Procedure Laterality Date  . ABDOMINAL AORTOGRAM W/LOWER EXTREMITY N/A 06/19/2016   Procedure: Abdominal Aortogram w/Lower Extremity;  Surgeon: Serafina Mitchell, MD;  Location: [place] [facility];  Service: Cardiovascular;  Laterality: N/A;  . CHOLECYSTECTOMY    . CORONARY ANGIOPLASTY WITH STENT PLACEMENT  2007   two 2.5  x 13 mm Cypher stents to the RCA  . dilatera cataracts removed    . right knee surgary  reports that he has never smoked. He has never used smokeless tobacco. He reports that he does not drink alcohol or use drugs.  Allergies  Allergen Reactions  . Iohexol Hives  . Ivp Dye [Iodinated Diagnostic Agents] Hives    Family History  Problem Relation Age of Onset  . Breast cancer Mother   . Hypertension Father        blood clot in leg  . Diabetes Maternal Aunt   . Heart attack Sister   . Colon cancer  Neg Hx   . Esophageal cancer Neg Hx   . Rectal cancer Neg Hx   . Stomach cancer Neg Hx      Prior to Admission medications   Medication Sig Start Date End Date Taking? Authorizing Provider  acetaminophen (TYLENOL) 160 MG/5ML suspension Take 650 mg by mouth as needed for mild pain. 20.3 ml ( 650 mg total)     [provider]  albuterol (PROVENTIL) (2.5 MG/3ML) 0.083% nebulizer solution Take 3 mLs (2.5 mg total) by nebulization every 2 (two) hours as needed for shortness of breath. 05/12/16   Rai, Ripudeep Raliegh Ip, MD  Amino Acids-Protein Hydrolys (FEEDING SUPPLEMENT, PRO-STAT SUGAR FREE 64,) LIQD Take 30 mLs by mouth 2 (two) times daily.    [provider]  amLODipine (NORVASC) 10 MG tablet Take 1 tablet (10 mg total) by mouth daily. 04/25/16   Macht, Ivan Anchors, PA-C  atorvastatin (LIPITOR) 40 MG tablet Take 1 tablet (40 mg total) by mouth daily at 6 PM. 04/03/16   Donne Hazel, MD  bethanechol (URECHOLINE) 10 MG tablet Take 1 tablet (10 mg total) by mouth 3 (three) times daily. 04/25/16   Forst, Ivan Anchors, PA-C  bisacodyl (DULCOLAX) 10 MG suppository Place 10 mg rectally as needed for moderate constipation.    [provider]  carvedilol (COREG) 25 MG tablet Take 1 tablet (25 mg total) by mouth 2 (two) times daily with a meal. 04/03/16   Donne Hazel, MD  cloNIDine (CATAPRES) 0.3 MG tablet Take 1 tablet (0.3 mg total) by mouth 3 (three) times daily. 04/03/16   Donne Hazel, MD  FLUoxetine (PROZAC) 10 MG capsule Take 1 capsule (10 mg total) by mouth daily. 04/25/16   Gow, Ivan Anchors, PA-C  furosemide (LASIX) 40 MG tablet Take 40 mg by mouth daily as needed for edema.    [provider]  GLUCERNA (GLUCERNA) LIQD Take 237 mLs by mouth 4 (four) times daily.    [provider]  hydrocerin (EUCERIN) CREA Apply 1 application topically 2 (two) times daily. 04/25/16   Higham, Ivan Anchors, PA-C  hydrocortisone (ANUSOL-HC) 2.5 % rectal cream Place rectally 2 (two) times daily as  needed for hemorrhoids or itching. 04/25/16   Arizmendi, Ivan Anchors, PA-C  insulin aspart (NOVOLOG) 100 UNIT/ML FlexPen Inject into the skin 3 (three) times daily with meals. CBG with meals and SSI as follows less than 150 no insulin, 151 - 200 = 3 units, 201 - 250 = 5 units, 251 - 300 = 8 units, 301 - 350 = 11 units, 351 - 400 = 15 units, greater than 400 give 15 units and call MD    [provider]  Insulin Glargine (TOUJEO SOLOSTAR) 300 UNIT/ML SOPN Inject 60 Units into the skin at bedtime.    [provider]  isosorbide-hydrALAZINE (BIDIL) 20-37.5 MG tablet Take 2 tablets by mouth 3 (three) times daily. 04/03/16   Donne Hazel, MD  lidocaine (XYLOCAINE) 2 % jelly Apply  topically as needed (Use with in and out catheter). 04/25/16   Terrance, Ivan Anchors, PA-C  mirtazapine (REMERON) 7.5 MG tablet Take 1 tablet (7.5 mg total) by mouth at bedtime. 04/25/16   Molchan, Ivan Anchors, PA-C  Multiple Vitamins-Minerals (MULTIVITAMIN PO) Take 1 tablet by mouth daily.    [provider]  nitroGLYCERIN (NITROSTAT) 0.4 MG SL tablet Place 1 tablet (0.4 mg total) under the tongue every 5 (five) minutes as needed for chest pain. 05/12/16   Rai, Ripudeep K, MD  ondansetron (ZOFRAN-ODT) 4 MG disintegrating tablet Take 4 mg by mouth every 6 (six) hours as needed for nausea or vomiting.     [provider]  polyethylene glycol (MIRALAX / GLYCOLAX) packet Take 17 g by mouth daily. 04/25/16   Scaff, Ivan Anchors, PA-C  QUETIAPINE FUMARATE PO Take by mouth.    [provider]  rivaroxaban (XARELTO) 20 MG TABS tablet Take 1 tablet (20 mg total) by mouth daily with supper. 04/25/16   Gibbs, Ivan Anchors, PA-C  senna-docusate (SENOKOT-S) 8.6-50 MG tablet Take 1 tablet by mouth daily. 04/04/16   Donne Hazel, MD  Sodium Phosphates (RA SALINE ENEMA) 19-7 GM/118ML ENEM Place 1 each rectally as needed (for constipation).    [provider]  tamsulosin (FLOMAX) 0.4 MG CAPS capsule Take 1 capsule (0.4 mg total)  by mouth daily after supper. 04/25/16   Bary Leriche, PA-C  zinc sulfate 220 (50 Zn) MG capsule Take 220 mg by mouth daily.    [provider]    Physical Exam: Vitals:   08/12/16 1706 08/12/16 1900 08/12/16 1928 08/12/16 1929  BP: 122/64 (!) 183/78  (!) 183/78  Pulse: (!) 101 89  85  Resp: 20 18  18   Temp: (!) 103.3 F (39.6 C)  (!) 100.7 F (38.2 C)   TempSrc: Oral  Oral   SpO2: 92% 96%  99%  Weight: 104.3 kg (230 lb)     Height: 5\' 9"  (1.753 m)         Constitutional: No respiratory distress, chronically-ill appearing.  Eyes: PERTLA, lids and conjunctivae normal ENMT: Mucous membranes are moist. Posterior pharynx clear of any exudate or lesions.   Neck: normal, supple, no masses, no thyromegaly Respiratory: clear to auscultation bilaterally, no wheezing, no crackles. Normal respiratory effort.   Cardiovascular: S1 & S2 heard, regular rate and rhythm. Trace pretibial edema bilaterally. No significant JVD. Abdomen: No distension, no tenderness, no masses palpated. Bowel sounds normal.  Musculoskeletal: no clubbing / cyanosis. No joint deformity upper and lower extremities.  Skin: Left heel atrophic with black eschar, slight serosanguinous drainage about perimeter. Right foot ulcers with foul odor thick discharge. Skin otherwise, warm, dry, well-perfused. Neurologic: Chronic facial droop. PERRL. Dysarthria.  Psychiatric: Alert and oriented to person, place, situation. Calm and cooperative.     Labs on Admission: I have personally reviewed following labs and imaging studies  CBC:  Recent Labs Lab 08/12/16 1724  WBC 8.7  NEUTROABS 6.9  HGB 9.8*  HCT 30.5*  MCV 84.5  PLT 979   Basic Metabolic Panel:  Recent Labs Lab 08/12/16 1724  NA 135  K 3.6  CL 100*  CO2 26  GLUCOSE 313*  BUN 22*  CREATININE 1.33*  CALCIUM 8.4*   GFR: Estimated Creatinine Clearance: 70.3 mL/min (A) (by C-G formula based on SCr of 1.33 mg/dL (H)). Liver Function  Tests:  Recent Labs Lab 08/12/16 1724  AST 16  ALT 17  ALKPHOS 75  BILITOT 0.3  PROT 7.5  ALBUMIN 2.5*   No results for input(s): LIPASE, AMYLASE in the last 168 hours. No results for input(s): AMMONIA in the last 168 hours. Coagulation Profile: No results for input(s): INR, PROTIME in the last 168 hours. Cardiac Enzymes: No results for input(s): CKTOTAL, CKMB, CKMBINDEX, TROPONINI in the last 168 hours. BNP (last 3 results) No results for input(s): PROBNP in the last 8760 hours. HbA1C: No results for input(s): HGBA1C in the last 72 hours. CBG:  Recent Labs Lab 08/12/16 1757  GLUCAP 271*   Lipid Profile: No results for input(s): CHOL, HDL, LDLCALC, TRIG, CHOLHDL, LDLDIRECT in the last 72 hours. Thyroid Function Tests: No results for input(s): TSH, T4TOTAL, FREET4, T3FREE, THYROIDAB in the last 72 hours. Anemia Panel: No results for input(s): VITAMINB12, FOLATE, FERRITIN, TIBC, IRON, RETICCTPCT in the last 72 hours. Urine analysis:    Component Value Date/Time   COLORURINE YELLOW 05/10/2016 1123   APPEARANCEUR CLEAR 05/10/2016 1123   LABSPEC 1.011 05/10/2016 1123   PHURINE 5.0 05/10/2016 1123   GLUCOSEU NEGATIVE 05/10/2016 1123   HGBUR NEGATIVE 05/10/2016 1123   BILIRUBINUR NEGATIVE 05/10/2016 1123   KETONESUR NEGATIVE 05/10/2016 1123   PROTEINUR NEGATIVE 05/10/2016 1123   UROBILINOGEN 0.2 07/12/2014 1228   NITRITE NEGATIVE 05/10/2016 1123   LEUKOCYTESUR NEGATIVE 05/10/2016 1123   Sepsis Labs: @LABRCNTIP (procalcitonin:4,lacticidven:4) )No results found for this or any previous visit (from the past 240 hour(s)).   Radiological Exams on Admission: Dg Chest 2 View  Result Date: 08/12/2016 CLINICAL DATA:  Initial evaluation for acute fever. EXAM: CHEST  2 VIEW COMPARISON:  Prior radiograph and CT from 05/10/2016. FINDINGS: Mild cardiomegaly, stable.  Mediastinal silhouette normal. Lungs hypoinflated. Mild perihilar vascular congestion without overt pulmonary  edema. Patchy and linear bibasilar opacities, favored to reflect atelectasis and/ or bronchovascular crowding, although superimposed infiltrate not entirely excluded, particularly at the left lung base. No pleural effusion. No pneumothorax. No acute osseous abnormality. IMPRESSION: 1. Shallow lung inflation with mild patchy and linear bibasilar opacities. Atelectasis/bronchovascular crowding is favored, although superimposed infiltrates could be considered in the correct clinical setting, particularly at the left lung base. 2. Cardiomegaly with mild perihilar vascular congestion without overt pulmonary edema. Electronically Signed   By: Jeannine Boga M.D.   On: 08/12/2016 18:07   Dg Ankle Complete Left  Result Date: 08/12/2016 CLINICAL DATA:  Initial evaluation for soft tissue ulceration at heel. Rule out osteo. EXAM: LEFT ANKLE COMPLETE - 3+ VIEW COMPARISON:  None. FINDINGS: Soft tissue irregularity at the plantar aspect of the heel, consistent with history of ulceration. Few scatter locular is of soft tissue emphysema present within this region due to ulceration. No radiopaque foreign body. No radiographic findings to suggest osteomyelitis. No acute fracture dislocation. Ankle mortise approximated. Vascular calcifications noted within the lower leg. IMPRESSION: 1. Soft tissue irregularity at the heel, compatible with history of ulceration in this region. Few scatter locked fills of soft tissue emphysema related to ulceration without radiographic evidence for osteomyelitis. No radiopaque foreign body. 2. No acute osseous abnormality about the ankle. Electronically Signed   By: Jeannine Boga M.D.   On: 08/12/2016 18:09   Dg Ankle Complete Right  Result Date: 08/12/2016 CLINICAL DATA:  Initial evaluation for heel ulcer. Rule out osteomyelitis. EXAM: RIGHT ANKLE - COMPLETE 3+ VIEW COMPARISON:  None. FINDINGS: No acute fracture or dislocation.  Ankle mortise approximated. Soft tissue irregularity  at overlying the lateral malleolus suspicious for ulceration. There is underlying cortical disruption of the distal fibula, suspicious for  possible osteomyelitis. No frank periosteal reaction. No dissecting soft tissue emphysema. No radiopaque foreign body. IMPRESSION: 1. Soft tissue irregularity overlying the lateral malleolus, consistent with ulceration. Underlying cortical erosion suspicious for osteomyelitis. 2. No other acute osseous abnormality about the ankle. Electronically Signed   By: Jeannine Boga M.D.   On: 08/12/2016 18:13    EKG: Not performed.   Assessment/Plan  1. Osteomyelitis, right foot  - Pt presents with fever to 103.7 F and non-healing foot wounds with plain radiographs suspicious for underlying osteomyelitis on the right   - Blood cultures were collected in ED and he was treated with empiric vancomycin and Zosyn  - Given the systemic sxs, will continue empiric abx with vancomycin, Flagyl, and Rocephin for diabetic foot infection with suspected osteomyelitis  - Trend inflammatory markers, control sugars, wound care consultation requested  - Treatment may be complicated by underlying PAD with poor distal perfusion; conservative management with abx for now    2. PAD  - Pt has known PAD, is following with vascular surgery and under consideration for bypass   - No evidence for acute occlusion  - Continue statin   3. Normocytic anemia  - Hgb is 9.8 on admission, down from 10.4 in April '18, and down from mid-12 range in March '18  - No active bleeding is evident  - Check anemia panel and FOBT - Repeat CBC in am    4. CAD  - No anginal complaints  - Hx of stent to RCA in 2007  - Continue current management with Lipitor, Bidil, Coreg   5. History of DVT - No evidence for acute VTE  - Continue Xarelto   6. Acute kidney injury  - SCr is 1.34 on admission, up from 0.90 in April 2018  - Likely prerenal azotemia in setting of acute infection  - Given a 1 liter  NS bolus in ED, continued on very gentle NS infusion  - Hold Lasix, repeat chem panel in am   7. Chronic diastolic CHF  - Pt hypovolemic on presentation in setting of acute infection  - TTE (05/11/16) with EF 55, severe LVH, grade 1 diastolic dysfunction, mild LAE  - Managed at home with Coreg, hydralazine, and Lasix 40 mg qD prn swelling    8. Dysphagia  - Pt choking on water in ED  - Secondary to prior CVA  - SLP eval requested, thickened liquids dysphagia diet for now  9. Hypertension  - BP at goal  - Continue Coreg and hydralazine   10. Insulin-dependent DM  - A1c was 11.0% in February '18  - Managed at home with Toujeo 60 units qHS and Novolog 3-15 units TID per sliding-scale   - Check CBG with meals and qHS  - Continue insulin glargine 60 units qHS with Novolog 3 units TID with meals and per moderate-intensity sliding-scale   DVT prophylaxis: Xarelto  Code Status: Full  Family Communication: Younger daughter updated at bedside, older daughter by phone Disposition Plan: Admit to med-surg Consults called: None Admission status: Inpatient    Vianne Bulls, MD [facility] Pager [phone]  If 7PM-7AM, please contact night-coverage [email or web address] Password [facility]  08/12/2016, 7:49 PM

## 2016-08-13 ENCOUNTER — Inpatient Hospital Stay (HOSPITAL_COMMUNITY): Payer: Medicare Other

## 2016-08-13 DIAGNOSIS — E1142 Type 2 diabetes mellitus with diabetic polyneuropathy: Secondary | ICD-10-CM

## 2016-08-13 DIAGNOSIS — R4189 Other symptoms and signs involving cognitive functions and awareness: Secondary | ICD-10-CM

## 2016-08-13 DIAGNOSIS — I10 Essential (primary) hypertension: Secondary | ICD-10-CM

## 2016-08-13 DIAGNOSIS — A419 Sepsis, unspecified organism: Secondary | ICD-10-CM

## 2016-08-13 DIAGNOSIS — B9620 Unspecified Escherichia coli [E. coli] as the cause of diseases classified elsewhere: Secondary | ICD-10-CM | POA: Diagnosis present

## 2016-08-13 DIAGNOSIS — R7881 Bacteremia: Secondary | ICD-10-CM

## 2016-08-13 DIAGNOSIS — E1151 Type 2 diabetes mellitus with diabetic peripheral angiopathy without gangrene: Secondary | ICD-10-CM

## 2016-08-13 DIAGNOSIS — I70234 Atherosclerosis of native arteries of right leg with ulceration of heel and midfoot: Secondary | ICD-10-CM

## 2016-08-13 DIAGNOSIS — M868X7 Other osteomyelitis, ankle and foot: Secondary | ICD-10-CM

## 2016-08-13 DIAGNOSIS — I70244 Atherosclerosis of native arteries of left leg with ulceration of heel and midfoot: Secondary | ICD-10-CM

## 2016-08-13 DIAGNOSIS — A498 Other bacterial infections of unspecified site: Secondary | ICD-10-CM | POA: Diagnosis present

## 2016-08-13 DIAGNOSIS — M869 Osteomyelitis, unspecified: Secondary | ICD-10-CM

## 2016-08-13 DIAGNOSIS — R4689 Other symptoms and signs involving appearance and behavior: Secondary | ICD-10-CM

## 2016-08-13 DIAGNOSIS — E1169 Type 2 diabetes mellitus with other specified complication: Secondary | ICD-10-CM

## 2016-08-13 DIAGNOSIS — B952 Enterococcus as the cause of diseases classified elsewhere: Secondary | ICD-10-CM

## 2016-08-13 DIAGNOSIS — B999 Unspecified infectious disease: Secondary | ICD-10-CM

## 2016-08-13 LAB — BLOOD CULTURE ID PANEL (REFLEXED)
ACINETOBACTER BAUMANNII: NOT DETECTED
CARBAPENEM RESISTANCE: NOT DETECTED
Candida albicans: NOT DETECTED
Candida glabrata: NOT DETECTED
Candida krusei: NOT DETECTED
Candida parapsilosis: NOT DETECTED
Candida tropicalis: NOT DETECTED
ENTEROBACTERIACEAE SPECIES: DETECTED — AB
ENTEROCOCCUS SPECIES: DETECTED — AB
Enterobacter cloacae complex: NOT DETECTED
Escherichia coli: DETECTED — AB
HAEMOPHILUS INFLUENZAE: NOT DETECTED
Klebsiella oxytoca: NOT DETECTED
Klebsiella pneumoniae: NOT DETECTED
Listeria monocytogenes: NOT DETECTED
Neisseria meningitidis: NOT DETECTED
Proteus species: DETECTED — AB
Pseudomonas aeruginosa: NOT DETECTED
STAPHYLOCOCCUS AUREUS BCID: NOT DETECTED
STAPHYLOCOCCUS SPECIES: NOT DETECTED
STREPTOCOCCUS PNEUMONIAE: NOT DETECTED
STREPTOCOCCUS SPECIES: NOT DETECTED
Serratia marcescens: NOT DETECTED
Streptococcus agalactiae: NOT DETECTED
Streptococcus pyogenes: NOT DETECTED
VANCOMYCIN RESISTANCE: NOT DETECTED

## 2016-08-13 LAB — BASIC METABOLIC PANEL
Anion gap: 6 (ref 5–15)
BUN: 19 mg/dL (ref 6–20)
CHLORIDE: 104 mmol/L (ref 101–111)
CO2: 27 mmol/L (ref 22–32)
Calcium: 8.1 mg/dL — ABNORMAL LOW (ref 8.9–10.3)
Creatinine, Ser: 1.1 mg/dL (ref 0.61–1.24)
GFR calc Af Amer: >60 mL/min (ref >60)
GFR calc non Af Amer: >60 mL/min (ref >60)
Glucose, Bld: 227 mg/dL — ABNORMAL HIGH (ref 65–99)
POTASSIUM: 3.5 mmol/L (ref 3.5–5.1)
SODIUM: 137 mmol/L (ref 135–145)

## 2016-08-13 LAB — URINALYSIS, ROUTINE W REFLEX MICROSCOPIC
BILIRUBIN URINE: NEGATIVE
GLUCOSE, UA: 50 mg/dL — AB
Hgb urine dipstick: NEGATIVE
KETONES UR: NEGATIVE mg/dL
LEUKOCYTES UA: NEGATIVE
NITRITE: NEGATIVE
PH: 5 (ref 5.0–8.0)
Protein, ur: 100 mg/dL — AB
Specific Gravity, Urine: 1.014 (ref 1.005–1.030)

## 2016-08-13 LAB — CBC WITH DIFFERENTIAL/PLATELET
Basophils Absolute: 0 10*3/uL (ref 0.0–0.1)
Basophils Relative: 0 %
EOS ABS: 0.2 10*3/uL (ref 0.0–0.7)
Eosinophils Relative: 2 %
HEMATOCRIT: 29.5 % — AB (ref 39.0–52.0)
HEMOGLOBIN: 9.3 g/dL — AB (ref 13.0–17.0)
LYMPHS ABS: 1.7 10*3/uL (ref 0.7–4.0)
LYMPHS PCT: 18 %
MCH: 26.3 pg (ref 26.0–34.0)
MCHC: 31.5 g/dL (ref 30.0–36.0)
MCV: 83.6 fL (ref 78.0–100.0)
Monocytes Absolute: 1.2 10*3/uL — ABNORMAL HIGH (ref 0.1–1.0)
Monocytes Relative: 12 %
NEUTROS ABS: 6.6 10*3/uL (ref 1.7–7.7)
NEUTROS PCT: 68 %
Platelets: 240 10*3/uL (ref 150–400)
RBC: 3.53 MIL/uL — AB (ref 4.22–5.81)
RDW: 13.8 % (ref 11.5–15.5)
WBC: 9.6 10*3/uL (ref 4.0–10.5)

## 2016-08-13 LAB — GLUCOSE, CAPILLARY
GLUCOSE-CAPILLARY: 206 mg/dL — AB (ref 65–99)
GLUCOSE-CAPILLARY: 291 mg/dL — AB (ref 65–99)
Glucose-Capillary: 212 mg/dL — ABNORMAL HIGH (ref 65–99)

## 2016-08-13 LAB — C-REACTIVE PROTEIN: CRP: 13.6 mg/dL — ABNORMAL HIGH (ref <1.0)

## 2016-08-13 LAB — FOLATE: FOLATE: 20.4 ng/mL (ref >5.9)

## 2016-08-13 LAB — RETICULOCYTES
RBC.: 3.67 MIL/uL — ABNORMAL LOW (ref 4.22–5.81)
Retic Count, Absolute: 22 10*3/uL (ref 19.0–186.0)
Retic Ct Pct: 0.6 % (ref 0.4–3.1)

## 2016-08-13 LAB — IRON AND TIBC
IRON: 10 ug/dL — AB (ref 45–182)
Saturation Ratios: 7 % — ABNORMAL LOW (ref 17.9–39.5)
TIBC: 141 ug/dL — AB (ref 250–450)
UIBC: 131 ug/dL

## 2016-08-13 LAB — VITAMIN B12: Vitamin B-12: 416 pg/mL (ref 180–914)

## 2016-08-13 LAB — SEDIMENTATION RATE: SED RATE: 135 mm/h — AB (ref 0–16)

## 2016-08-13 LAB — FERRITIN: FERRITIN: 367 ng/mL — AB (ref 24–336)

## 2016-08-13 LAB — HEPARIN LEVEL (UNFRACTIONATED): Heparin Unfractionated: 2 IU/mL — ABNORMAL HIGH (ref 0.30–0.70)

## 2016-08-13 LAB — PROTIME-INR
INR: 1.71
Prothrombin Time: 20.3 seconds — ABNORMAL HIGH (ref 11.4–15.2)

## 2016-08-13 LAB — APTT: APTT: 45 s — AB (ref 24–36)

## 2016-08-13 LAB — MRSA PCR SCREENING: MRSA BY PCR: NEGATIVE

## 2016-08-13 MED ORDER — HEPARIN (PORCINE) IN NACL 100-0.45 UNIT/ML-% IJ SOLN
1500.0000 [IU]/h | INTRAMUSCULAR | Status: DC
  Administered 2016-08-13: 1500 [IU]/h via INTRAVENOUS
  Filled 2016-08-13 (×2): qty 250

## 2016-08-13 NOTE — Progress Notes (Signed)
PHARMACY - PHYSICIAN COMMUNICATION CRITICAL VALUE ALERT - BLOOD CULTURE IDENTIFICATION (BCID)  Results for orders placed or performed during the hospital encounter of 08/12/16  Blood Culture ID Panel (Reflexed) (Collected: 08/12/2016  5:29 PM)  Result Value Ref Range   Enterococcus species DETECTED (A) NOT DETECTED   Vancomycin resistance NOT DETECTED NOT DETECTED   Listeria monocytogenes NOT DETECTED NOT DETECTED   Staphylococcus species NOT DETECTED NOT DETECTED   Staphylococcus aureus NOT DETECTED NOT DETECTED   Streptococcus species NOT DETECTED NOT DETECTED   Streptococcus agalactiae NOT DETECTED NOT DETECTED   Streptococcus pneumoniae NOT DETECTED NOT DETECTED   Streptococcus pyogenes NOT DETECTED NOT DETECTED   Acinetobacter baumannii NOT DETECTED NOT DETECTED   Enterobacteriaceae species DETECTED (A) NOT DETECTED   Enterobacter cloacae complex NOT DETECTED NOT DETECTED   Escherichia coli DETECTED (A) NOT DETECTED   Klebsiella oxytoca NOT DETECTED NOT DETECTED   Klebsiella pneumoniae NOT DETECTED NOT DETECTED   Proteus species DETECTED (A) NOT DETECTED   Serratia marcescens NOT DETECTED NOT DETECTED   Carbapenem resistance NOT DETECTED NOT DETECTED   Haemophilus influenzae NOT DETECTED NOT DETECTED   Neisseria meningitidis NOT DETECTED NOT DETECTED   Pseudomonas aeruginosa NOT DETECTED NOT DETECTED   Candida albicans NOT DETECTED NOT DETECTED   Candida glabrata NOT DETECTED NOT DETECTED   Candida krusei NOT DETECTED NOT DETECTED   Candida parapsilosis NOT DETECTED NOT DETECTED   Candida tropicalis NOT DETECTED NOT DETECTED    Name of physician (or Provider) Contacted: Dr. Ree Kida  Changes to prescribed antibiotics required: None at this time. ID will be consulted for enterococcus in blood.   Clovis Riley 08/13/2016  12:02 PM

## 2016-08-13 NOTE — Progress Notes (Signed)
CSW consulted to assist with d/c planning. Pt is from [facility]. PN reviewed. Pt is alert and oriented x1 only. CSW left message for pt's daughter Marlyne Beards [phone]  to contact CSW for assistance with d/c planning. CSW has spoken with Admissions Coordinator at SNF. AC reports that pt has exhausted his medicare benefits and has no payment source, at this time, to cover SNF placement. AC from SNF reports that family has not returned calls from SNF, and as far as she is aware, pt has not applied for [employer]. CSW will continue to try to reach family to assist with d/c planning / insurance coverage.  Werner Lean LCSW [phone]

## 2016-08-13 NOTE — Consult Note (Signed)
Hospital Consult    Reason for Consult:  Bilateral heel ulcers Requesting Physician:  Dr. Ree Kida MRN #:  [id number]  History of Present Illness: This is a 61 y.o. male with history of pad and diabetes with stroke. Has known heel ulcers Also has dvt curently on xarelto. Now admitted with fever and tachycardia with + blood cultures. Previously underwent angiogram with Dr. Trula Slade in April and was considered for femoral to pt artery bypass. States he cannot walk much.   Past Medical History:  Diagnosis Date  . Adrenal insufficiency ([place])   . Allergy   . Arthritis   . Blind left eye   . Carpal tunnel syndrome   . Cataract    bil removed  . CHF (congestive heart failure) ([place])   . Diabetes mellitus without complication ([place])   . ED (erectile dysfunction)   . Gout   . Herpes   . Hypercholesterolemia   . Hypertension   . MI (myocardial infarction) ([place])   . Nausea & vomiting   . Peripheral neuropathy   . Pneumonia   . PVD (peripheral vascular disease) ([place])     Past Surgical History:  Procedure Laterality Date  . ABDOMINAL AORTOGRAM W/LOWER EXTREMITY N/A 06/19/2016   Procedure: Abdominal Aortogram w/Lower Extremity;  Surgeon: Serafina Mitchell, MD;  Location: [facility];  Service: Cardiovascular;  Laterality: N/A;  . CHOLECYSTECTOMY    . CORONARY ANGIOPLASTY WITH STENT PLACEMENT  2007   two 2.5  x 13 mm Cypher stents to the RCA  . dilatera cataracts removed    . right knee surgary      Allergies  Allergen Reactions  . Iohexol Hives  . Ivp Dye [Iodinated Diagnostic Agents] Hives    Prior to Admission medications   Medication Sig Start Date End Date Taking? Authorizing Provider  acetaminophen (TYLENOL) 160 MG/5ML suspension Take 650 mg by mouth as needed for mild pain. 20.3 ml ( 650 mg total)    Yes [provider]  albuterol (PROVENTIL) (2.5 MG/3ML) 0.083% nebulizer solution Take 3 mLs (2.5 mg total) by nebulization every 2 (two) hours as needed for  shortness of breath. 05/12/16  Yes Rai, Ripudeep K, MD  Amino Acids-Protein Hydrolys (FEEDING SUPPLEMENT, PRO-STAT SUGAR FREE 64,) LIQD Take 30 mLs by mouth 2 (two) times daily.   Yes [provider]  amLODipine (NORVASC) 10 MG tablet Take 1 tablet (10 mg total) by mouth daily. 04/25/16  Yes Sweeting, Ivan Anchors, PA-C  atorvastatin (LIPITOR) 40 MG tablet Take 1 tablet (40 mg total) by mouth daily at 6 PM. 04/03/16  Yes Donne Hazel, MD  bethanechol (URECHOLINE) 10 MG tablet Take 1 tablet (10 mg total) by mouth 3 (three) times daily. 04/25/16  Yes Souder, Ivan Anchors, PA-C  bisacodyl (DULCOLAX) 10 MG suppository Place 10 mg rectally as needed for moderate constipation.   Yes [provider]  carvedilol (COREG) 25 MG tablet Take 1 tablet (25 mg total) by mouth 2 (two) times daily with a meal. 04/03/16  Yes Donne Hazel, MD  cloNIDine (CATAPRES) 0.3 MG tablet Take 1 tablet (0.3 mg total) by mouth 3 (three) times daily. 04/03/16  Yes Donne Hazel, MD  FLUoxetine (PROZAC) 10 MG capsule Take 1 capsule (10 mg total) by mouth daily. 04/25/16  Yes Horn, Ivan Anchors, PA-C  furosemide (LASIX) 40 MG tablet Take 40 mg by mouth daily as needed for edema.   Yes [provider]  GLUCERNA (GLUCERNA) LIQD Take 237 mLs by  mouth 4 (four) times daily.   Yes [provider]  hydrocortisone (ANUSOL-HC) 2.5 % rectal cream Place rectally 2 (two) times daily as needed for hemorrhoids or itching. 04/25/16  Yes Delmore, Ivan Anchors, PA-C  insulin aspart (NOVOLOG) 100 UNIT/ML FlexPen Inject into the skin 3 (three) times daily with meals. CBG with meals and SSI as follows less than 150 no insulin, 151 - 200 = 3 units, 201 - 250 = 5 units, 251 - 300 = 8 units, 301 - 350 = 11 units, 351 - 400 = 15 units, greater than 400 give 15 units and call MD   Yes [provider]  Insulin Glargine (TOUJEO SOLOSTAR) 300 UNIT/ML SOPN Inject 60 Units into the skin at bedtime.   Yes [provider]    isosorbide-hydrALAZINE (BIDIL) 20-37.5 MG tablet Take 2 tablets by mouth 3 (three) times daily. 04/03/16  Yes Donne Hazel, MD  lidocaine (XYLOCAINE) 2 % jelly Apply topically as needed (Use with in and out catheter). 04/25/16  Yes Paolillo, Ivan Anchors, PA-C  mirtazapine (REMERON) 7.5 MG tablet Take 1 tablet (7.5 mg total) by mouth at bedtime. 04/25/16  Yes Stricker, Ivan Anchors, PA-C  Multiple Vitamins-Minerals (MULTIVITAMIN PO) Take 1 tablet by mouth daily.   Yes [provider]  ondansetron (ZOFRAN-ODT) 4 MG disintegrating tablet Take 4 mg by mouth every 6 (six) hours as needed for nausea or vomiting.    Yes [provider]  polyethylene glycol (MIRALAX / GLYCOLAX) packet Take 17 g by mouth daily. 04/25/16  Yes Brase, Ivan Anchors, PA-C  QUEtiapine (SEROQUEL) 25 MG tablet Take 25 mg by mouth at bedtime.    Yes [provider]  rivaroxaban (XARELTO) 20 MG TABS tablet Take 1 tablet (20 mg total) by mouth daily with supper. 04/25/16  Yes Rosander, Ivan Anchors, PA-C  senna-docusate (SENOKOT-S) 8.6-50 MG tablet Take 1 tablet by mouth daily. 04/04/16  Yes Donne Hazel, MD  Sodium Phosphates (RA SALINE ENEMA) 19-7 GM/118ML ENEM Place 1 each rectally as needed (for constipation).   Yes [provider]  tamsulosin (FLOMAX) 0.4 MG CAPS capsule Take 1 capsule (0.4 mg total) by mouth daily after supper. 04/25/16  Yes Shelvin, Ivan Anchors, PA-C  zinc sulfate 220 (50 Zn) MG capsule Take 220 mg by mouth daily.   Yes [provider]  hydrocerin (EUCERIN) CREA Apply 1 application topically 2 (two) times daily. Patient not taking: Reported on 08/12/2016 04/25/16   Nunes, Ivan Anchors, PA-C  nitroGLYCERIN (NITROSTAT) 0.4 MG SL tablet Place 1 tablet (0.4 mg total) under the tongue every 5 (five) minutes as needed for chest pain. Patient not taking: Reported on 08/12/2016 05/12/16   Mendel Corning, MD    Social History   Social History  . Marital status: Married    Spouse name: N/A  . Number of children:  N/A  . Years of education: N/A   Occupational History  . Not on file.   Social History Main Topics  . Smoking status: Never Smoker  . Smokeless tobacco: Never Used  . Alcohol use No  . Drug use: No  . Sexual activity: No   Other Topics Concern  . Not on file   Social History Narrative  . No narrative on file     Family History  Problem Relation Age of Onset  . Breast cancer Mother   . Hypertension Father        blood clot in leg  . Diabetes Maternal Aunt   .  Heart attack Sister   . Colon cancer Neg Hx   . Esophageal cancer Neg Hx   . Rectal cancer Neg Hx   . Stomach cancer Neg Hx     ROS: [x]  Positive   [ ]  Negative   [ ]  All sytems reviewed and are negative Cardiovascular: []  chest pain/pressure []  palpitations []  SOB lying flat []  DOE []  pain in legs while walking [x]  pain in legs at rest []  pain in legs at night []  non-healing ulcers []  hx of DVT []  swelling in legs  Pulmonary: []  productive cough []  asthma/wheezing []  home O2  Neurologic: [x]  weakness in [x]  arms []  legs []  numbness in []  arms []  legs []  hx of CVA []  mini stroke [] difficulty speaking or slurred speech []  temporary loss of vision in one eye []  dizziness  Hematologic: []  hx of cancer []  bleeding problems []  problems with blood clotting easily  Endocrine:   [x]  diabetes []  thyroid disease  GI []  vomiting blood []  blood in stool  GU: []  CKD/renal failure []  HD--[]  M/W/F or []  T/T/S []  burning with urination []  blood in urine  Psychiatric: []  anxiety []  depression  Musculoskeletal: []  arthritis []  joint pain  Integumentary: []  rashes []  ulcers  Constitutional: [x]  fever []  chills   Physical Examination  Vitals:   08/13/16 1500 08/13/16 1848  BP: 138/80 (!) 172/79  Pulse: 88 97  Resp: 18   Temp: 98.6 F (37 C)    Body mass index is 32.17 kg/m.  General:  WDWN in NAD Gait: Not observed HENT: WNL, normocephalic Pulmonary: normal non-labored  breathing, without Rales, rhonchi,  wheezing Cardiac:bilateral femoral pulses are palpable Abdomen: soft, NT/ND, no masses Extremities: large necrotic left heel ulceration  Dry heel ulceration on right with large lateral ulceration overlying distal fibula  Neurologic: speech and rue affected from cva Psychiatric: appropriate mood and affect  CBC    Component Value Date/Time   WBC 9.6 08/13/2016 0434   RBC 3.67 (L) 08/13/2016 1504   RBC 3.53 (L) 08/13/2016 0434   HGB 9.3 (L) 08/13/2016 0434   HCT 29.5 (L) 08/13/2016 0434   PLT 240 08/13/2016 0434   MCV 83.6 08/13/2016 0434   MCH 26.3 08/13/2016 0434   MCHC 31.5 08/13/2016 0434   RDW 13.8 08/13/2016 0434   LYMPHSABS 1.7 08/13/2016 0434   MONOABS 1.2 (H) 08/13/2016 0434   EOSABS 0.2 08/13/2016 0434   BASOSABS 0.0 08/13/2016 0434    BMET    Component Value Date/Time   NA 137 08/13/2016 0434   NA 140 06/19/2016   K 3.5 08/13/2016 0434   CL 104 08/13/2016 0434   CO2 27 08/13/2016 0434   GLUCOSE 227 (H) 08/13/2016 0434   BUN 19 08/13/2016 0434   BUN 12 06/19/2016   CREATININE 1.10 08/13/2016 0434   CREATININE 1.08 06/05/2016 1227   CALCIUM 8.1 (L) 08/13/2016 0434   GFRNONAA >60 08/13/2016 0434   GFRAA >60 08/13/2016 0434    COAGS: Lab Results  Component Value Date   INR 1.71 08/13/2016   INR 1.21 05/10/2016   INR 0.92 03/28/2016     Non-Invasive Vascular Imaging:   Left foot MRI IMPRESSION: Large heel ulceration with signal change and gas in at least the posterior 4.5 cm of the calcaneus consistent with emphysematous osteomyelitis. Milder degree in the more distal calcaneus toward the calcaneocuboid joint could be due to osteomyelitis but has an appearance most suggestive of reactive change.  Intense subcutaneous edema about the ankle  and visualized foot consistent with dependent change and/or cellulitis.  Mild intrasubstance of the distal most Achilles tendon consistent with tendinosis, possibly  infectious given the patient's heel ulceration. Associated small volume of fluid in the retrocalcaneal bursa could be septic or aseptic.  Edema within all imaged intrinsic musculature the foot could be due to denervation atrophy and/or inflammatory change. No intramuscular abscess is seen.  Degenerative or neuropathic change about the midfoot.   Right foot MRI IMPRESSION: Osteomyelitis of the lateral malleolus of the distal fibula.    ASSESSMENT/PLAN: This is a 61 y.o. male with distal osteomyelitis of both lower extremities. Last xarelto was today. Will need transfer to [facility] for definitive management. Plan to discuss case with Dr. Trula Slade tomorrow.   Cate Oravec C. Donzetta Matters, MD [facility] Office: [phone] Pager: [phone]

## 2016-08-13 NOTE — Progress Notes (Addendum)
PROGRESS NOTE    [name]  JIR:[id number] DOB: January 05, 1956 DOA: 08/12/2016 PCP: Maury Dus, MD   Chief Complaint  Patient presents with  . Fever     Brief Narrative:  HPI on 08/12/2016 by Dr. Christia Reading Opyd [name] is a 61 y.o. male with medical history significant for insulin-dependent diabetes mellitus, hypertension, chronic diastolic CHF, history of DVT, CAD with stents, peripheral arterial disease, and history of CVA with residual dysarthria, dysphagia, and weakness, now presenting from his SNF for evaluation of fevers. Patient had reportedly been in his usual state of health until he was noted to have a temperature of 103.7 F this afternoon at the nursing home. He was treated with Tylenol and EMS was called for transport to the hospital. There has not been any recent cough, vomiting, or diarrhea. Patient has not been voicing any specific complaints. He has been receiving wound care for bilateral foot ulcers. He has been following with vascular surgery in the outpatient setting for peripheral arterial disease and nonhealing foot wounds bilaterally. He is accompanied by his daughter who assists with the history. Aside from Tylenol, no other interventions were attempted prior to coming into the ED. Assessment & Plan   Sepsis secondary to possible osteomyelitis of the feet, heel ulcers/Bacteremia -Patient presented with fever and tachycardia -Patient placed on vancomycin, ceftriaxone, Flagyl -ABIs ordered- see below -Wound care consulted and appreciated -Blood cultures show Escherichia coli, enterococcus, Proteus -Infectious disease consulted and appreciated -Will obtain MRI  PAD -Patient follows with vascular surgery -ABI obtained, the right is 0.39 severe, left is her 0.29 suggestive of critical arterial occlusion -Continue statin -As above will obtain MRI  -Vascular surgery consulted and appreciated  Normocytic anemia -Baseline hemoglobin approximately 12, however  noted to be 10 and April 2018 -Currently 9.3 -Will obtain anemia panel, FOBT  Coronary artery disease -Currently no colitis chest pain -Patient had stent to the RCA in 2007 -Continue Coreg, statin, bidil  History of DVT -continue anticoagulation, however will discontinue Xarelto and placed on heparin  Acute kidney injury -Upon admission, creatinine 1.34. Baseline approximate 0.9 -Suspect secondary to infection and prerenal causes -Currently 1.1 -Continue to monitor BMP  Chronic Diastolic heart failure -Last echocardiogram 05/11/2016 showed EF 75%, grade 1 diastolic dysfunction, mild LAE, severe LVH -Continue Coreg. Lasix held -Currently euvolemic  -Monitor intake, output, daily weights  Dysphagia -Suppose a choking on water emergency department -Possibly secondary to prior CVA -Speech consulted and evaluated, continue thickened liquids, dysphagia diet for now  Essential hypertension -Continue Coreg, hydralazine, Bidil, amolodipine   Diabetes mellitus, type II -Hemoglobin A1c was 11, and February 2018 -Patient uses Toujeo at home with novolog -Continue Lantus, and some fine scale, CBG monitoring  Depression -Continue Prozac  DVT Prophylaxis  Xarelto, changed to heparin  Code Status: Full  Family Communication: None at bedside  Disposition Plan: Admitted. Pending further workup, MRI feet.   Consultants Infectious disease  Procedures  ABI  Antibiotics   Anti-infectives    Start     Dose/Rate Route Frequency Ordered Stop   08/13/16 1800  vancomycin (VANCOCIN) 1,250 mg in sodium chloride 0.9 % 250 mL IVPB     1,250 mg 166.7 mL/hr over 90 Minutes Intravenous Every 24 hours 08/12/16 1845     08/13/16 0000  piperacillin-tazobactam (ZOSYN) IVPB 3.375 g  Status:  Discontinued     3.375 g 12.5 mL/hr over 240 Minutes Intravenous Every 8 hours 08/12/16 1845 08/12/16 1948   08/13/16 0000  cefTRIAXone (ROCEPHIN)  2 g in dextrose 5 % 50 mL IVPB     2 g 100 mL/hr over  30 Minutes Intravenous Every 24 hours 08/12/16 1948     08/12/16 2200  metroNIDAZOLE (FLAGYL) IVPB 500 mg     500 mg 100 mL/hr over 60 Minutes Intravenous Every 8 hours 08/12/16 1948     08/12/16 1815  vancomycin (VANCOCIN) 2,000 mg in sodium chloride 0.9 % 500 mL IVPB     2,000 mg 250 mL/hr over 120 Minutes Intravenous  Once 08/12/16 1804 08/12/16 2128   08/12/16 1800  piperacillin-tazobactam (ZOSYN) IVPB 3.375 g     3.375 g 100 mL/hr over 30 Minutes Intravenous  Once 08/12/16 1748 08/12/16 1924   08/12/16 1800  vancomycin (VANCOCIN) IVPB 1000 mg/200 mL premix  Status:  Discontinued     1,000 mg 200 mL/hr over 60 Minutes Intravenous  Once 08/12/16 1748 08/12/16 1804      Subjective:   [name] seen and examined today.   Patient with no complaints today. Very sleepy. Denies chest pain, shortness breath, abdominal pain, nausea vomiting, diarrhea constipation.  Objective:   Vitals:   08/12/16 1929 08/12/16 2116 08/13/16 0557 08/13/16 0712  BP: (!) 183/78 136/65 (!) 149/89   Pulse: 85 78 94   Resp: 18 18 18    Temp:  99.8 F (37.7 C) (!) 101.3 F (38.5 C)   TempSrc:  Oral Oral   SpO2: 99% 95% 98%   Weight:  97 kg (213 lb 13.5 oz)  98.8 kg (217 lb 13 oz)  Height:  5\' 9"  (1.753 m)      Intake/Output Summary (Last 24 hours) at 08/13/16 1320 Last data filed at 08/13/16 1000  Gross per 24 hour  Intake             1111 ml  Output              600 ml  Net              511 ml   Filed Weights   08/12/16 1706 08/12/16 2116 08/13/16 0712  Weight: 104.3 kg (230 lb) 97 kg (213 lb 13.5 oz) 98.8 kg (217 lb 13 oz)    Exam  General: Well developed, well nourished, NAD, appears stated age  HEENT: NCAT, mucous membranes moist.   Cardiovascular: S1 S2 auscultated, no rubs, murmurs or gallops. Regular rate and rhythm.  Respiratory: Clear to auscultation bilaterally with equal chest rise  Abdomen: Soft, nontender, nondistended, + bowel sounds  Extremities: warm dry without  cyanosis clubbing. Trace LE edema. Both feels with eschar, right heel with ulcer.Foul odor, discharge.   Neuro: AAOx3, Chronic facial droop, dysarthria, otherwise non-focal  Skin: Without rashes exudates or nodules  Psych: Flat affect   Data Reviewed: I have personally reviewed following labs and imaging studies  CBC:  Recent Labs Lab 08/12/16 1724 08/13/16 0434  WBC 8.7 9.6  NEUTROABS 6.9 6.6  HGB 9.8* 9.3*  HCT 30.5* 29.5*  MCV 84.5 83.6  PLT 263 401   Basic Metabolic Panel:  Recent Labs Lab 08/12/16 1724 08/13/16 0434  NA 135 137  K 3.6 3.5  CL 100* 104  CO2 26 27  GLUCOSE 313* 227*  BUN 22* 19  CREATININE 1.33* 1.10  CALCIUM 8.4* 8.1*   GFR: Estimated Creatinine Clearance: 82.7 mL/min (by C-G formula based on SCr of 1.1 mg/dL). Liver Function Tests:  Recent Labs Lab 08/12/16 1724  AST 16  ALT 17  ALKPHOS 75  BILITOT  0.3  PROT 7.5  ALBUMIN 2.5*   No results for input(s): LIPASE, AMYLASE in the last 168 hours. No results for input(s): AMMONIA in the last 168 hours. Coagulation Profile: No results for input(s): INR, PROTIME in the last 168 hours. Cardiac Enzymes: No results for input(s): CKTOTAL, CKMB, CKMBINDEX, TROPONINI in the last 168 hours. BNP (last 3 results) No results for input(s): PROBNP in the last 8760 hours. HbA1C: No results for input(s): HGBA1C in the last 72 hours. CBG:  Recent Labs Lab 08/12/16 1757 08/12/16 2220 08/13/16 0744  GLUCAP 271* 253* 206*   Lipid Profile: No results for input(s): CHOL, HDL, LDLCALC, TRIG, CHOLHDL, LDLDIRECT in the last 72 hours. Thyroid Function Tests: No results for input(s): TSH, T4TOTAL, FREET4, T3FREE, THYROIDAB in the last 72 hours. Anemia Panel: No results for input(s): VITAMINB12, FOLATE, FERRITIN, TIBC, IRON, RETICCTPCT in the last 72 hours. Urine analysis:    Component Value Date/Time   COLORURINE YELLOW 05/10/2016 1123   APPEARANCEUR CLEAR 05/10/2016 1123   LABSPEC 1.011  05/10/2016 1123   PHURINE 5.0 05/10/2016 1123   GLUCOSEU NEGATIVE 05/10/2016 1123   HGBUR NEGATIVE 05/10/2016 1123   BILIRUBINUR NEGATIVE 05/10/2016 1123   KETONESUR NEGATIVE 05/10/2016 1123   PROTEINUR NEGATIVE 05/10/2016 1123   UROBILINOGEN 0.2 07/12/2014 1228   NITRITE NEGATIVE 05/10/2016 1123   LEUKOCYTESUR NEGATIVE 05/10/2016 1123   Sepsis Labs: @LABRCNTIP (procalcitonin:4,lacticidven:4)  ) Recent Results (from the past 240 hour(s))  Blood culture (routine x 2)     Status: None (Preliminary result)   Collection Time: 08/12/16  5:29 PM  Result Value Ref Range Status   Specimen Description BLOOD LEFT ARM  Final   Special Requests BOTTLES DRAWN AEROBIC AND ANAEROBIC BCAV  Final   Culture  Setup Time   Final    GRAM NEGATIVE RODS GRAM POSITIVE COCCI IN PAIRS IN BOTH AEROBIC AND ANAEROBIC BOTTLES CRITICAL RESULT CALLED TO, READ BACK BY AND VERIFIED WITH: T GREEN,PHARMD AT 1151 08/13/16 BY L BENFIELD Performed at [facility], [place] [address], [place], [place] [address]    Culture Lonell Grandchild NEGATIVE RODS GRAM POSITIVE COCCI   Final   Report Status PENDING  Incomplete  Blood Culture ID Panel (Reflexed)     Status: Abnormal   Collection Time: 08/12/16  5:29 PM  Result Value Ref Range Status   Enterococcus species DETECTED (A) NOT DETECTED Final    Comment: CRITICAL RESULT CALLED TO, READ BACK BY AND VERIFIED WITH: T GREEN,PHARMD AT 1151 08/13/16 BY L BENFIELD    Vancomycin resistance NOT DETECTED NOT DETECTED Final   Listeria monocytogenes NOT DETECTED NOT DETECTED Final   Staphylococcus species NOT DETECTED NOT DETECTED Final   Staphylococcus aureus NOT DETECTED NOT DETECTED Final   Streptococcus species NOT DETECTED NOT DETECTED Final   Streptococcus agalactiae NOT DETECTED NOT DETECTED Final   Streptococcus pneumoniae NOT DETECTED NOT DETECTED Final   Streptococcus pyogenes NOT DETECTED NOT DETECTED Final   Acinetobacter baumannii NOT DETECTED NOT DETECTED Final    Enterobacteriaceae species DETECTED (A) NOT DETECTED Final    Comment: CRITICAL RESULT CALLED TO, READ BACK BY AND VERIFIED WITH: T GREEN,PHARMD AT 1151 08/13/16 BY L BENFIELD    Enterobacter cloacae complex NOT DETECTED NOT DETECTED Final   Escherichia coli DETECTED (A) NOT DETECTED Final    Comment: CRITICAL RESULT CALLED TO, READ BACK BY AND VERIFIED WITH: T GREEN,PHARMD AT 1151 08/13/16 BY L BENFIELD    Klebsiella oxytoca NOT DETECTED NOT DETECTED Final   Klebsiella pneumoniae  NOT DETECTED NOT DETECTED Final   Proteus species DETECTED (A) NOT DETECTED Final    Comment: CRITICAL RESULT CALLED TO, READ BACK BY AND VERIFIED WITH: T GREEN,PHARMD AT 1151 08/13/16 BY L BENFIELD    Serratia marcescens NOT DETECTED NOT DETECTED Final   Carbapenem resistance NOT DETECTED NOT DETECTED Final   Haemophilus influenzae NOT DETECTED NOT DETECTED Final   Neisseria meningitidis NOT DETECTED NOT DETECTED Final   Pseudomonas aeruginosa NOT DETECTED NOT DETECTED Final   Candida albicans NOT DETECTED NOT DETECTED Final   Candida glabrata NOT DETECTED NOT DETECTED Final   Candida krusei NOT DETECTED NOT DETECTED Final   Candida parapsilosis NOT DETECTED NOT DETECTED Final   Candida tropicalis NOT DETECTED NOT DETECTED Final    Comment: Performed at [facility], [place] [address], [place], [place] [address]  Blood culture (routine x 2)     Status: None (Preliminary result)   Collection Time: 08/12/16  5:53 PM  Result Value Ref Range Status   Specimen Description BLOOD RIGHT HAND  Final   Special Requests   Final    BOTTLES DRAWN AEROBIC AND ANAEROBIC Blood Culture adequate volume   Culture PENDING  Incomplete   Report Status PENDING  Incomplete  MRSA PCR Screening     Status: None   Collection Time: 08/13/16  5:50 AM  Result Value Ref Range Status   MRSA by PCR NEGATIVE NEGATIVE Final    Comment:        The GeneXpert MRSA Assay (FDA approved for NASAL specimens only), is one component of  a comprehensive MRSA colonization surveillance program. It is not intended to diagnose MRSA infection nor to guide or monitor treatment for MRSA infections.       Radiology Studies: Dg Chest 2 View  Result Date: 08/12/2016 CLINICAL DATA:  Initial evaluation for acute fever. EXAM: CHEST  2 VIEW COMPARISON:  Prior radiograph and CT from 05/10/2016. FINDINGS: Mild cardiomegaly, stable.  Mediastinal silhouette normal. Lungs hypoinflated. Mild perihilar vascular congestion without overt pulmonary edema. Patchy and linear bibasilar opacities, favored to reflect atelectasis and/ or bronchovascular crowding, although superimposed infiltrate not entirely excluded, particularly at the left lung base. No pleural effusion. No pneumothorax. No acute osseous abnormality. IMPRESSION: 1. Shallow lung inflation with mild patchy and linear bibasilar opacities. Atelectasis/bronchovascular crowding is favored, although superimposed infiltrates could be considered in the correct clinical setting, particularly at the left lung base. 2. Cardiomegaly with mild perihilar vascular congestion without overt pulmonary edema. Electronically Signed   By: Jeannine Boga M.D.   On: 08/12/2016 18:07   Dg Ankle Complete Left  Result Date: 08/12/2016 CLINICAL DATA:  Initial evaluation for soft tissue ulceration at heel. Rule out osteo. EXAM: LEFT ANKLE COMPLETE - 3+ VIEW COMPARISON:  None. FINDINGS: Soft tissue irregularity at the plantar aspect of the heel, consistent with history of ulceration. Few scatter locular is of soft tissue emphysema present within this region due to ulceration. No radiopaque foreign body. No radiographic findings to suggest osteomyelitis. No acute fracture dislocation. Ankle mortise approximated. Vascular calcifications noted within the lower leg. IMPRESSION: 1. Soft tissue irregularity at the heel, compatible with history of ulceration in this region. Few scatter locked fills of soft tissue  emphysema related to ulceration without radiographic evidence for osteomyelitis. No radiopaque foreign body. 2. No acute osseous abnormality about the ankle. Electronically Signed   By: Jeannine Boga M.D.   On: 08/12/2016 18:09   Dg Ankle Complete Right  Result Date: 08/12/2016 CLINICAL  DATA:  Initial evaluation for heel ulcer. Rule out osteomyelitis. EXAM: RIGHT ANKLE - COMPLETE 3+ VIEW COMPARISON:  None. FINDINGS: No acute fracture or dislocation.  Ankle mortise approximated. Soft tissue irregularity at overlying the lateral malleolus suspicious for ulceration. There is underlying cortical disruption of the distal fibula, suspicious for possible osteomyelitis. No frank periosteal reaction. No dissecting soft tissue emphysema. No radiopaque foreign body. IMPRESSION: 1. Soft tissue irregularity overlying the lateral malleolus, consistent with ulceration. Underlying cortical erosion suspicious for osteomyelitis. 2. No other acute osseous abnormality about the ankle. Electronically Signed   By: Jeannine Boga M.D.   On: 08/12/2016 18:13     Scheduled Meds: . amLODipine  10 mg Oral Daily  . atorvastatin  40 mg Oral q1800  . bethanechol  10 mg Oral TID  . carvedilol  25 mg Oral BID WC  . cloNIDine  0.3 mg Oral TID  . feeding supplement (GLUCERNA SHAKE)  237 mL Oral QID  . feeding supplement (PRO-STAT SUGAR FREE 64)  30 mL Oral BID  . FLUoxetine  10 mg Oral Daily  . insulin aspart  0-15 Units Subcutaneous TID WC  . insulin aspart  0-5 Units Subcutaneous QHS  . insulin aspart  3 Units Subcutaneous TID WC  . insulin glargine  60 Units Subcutaneous QHS  . isosorbide-hydrALAZINE  2 tablet Oral TID  . mirtazapine  7.5 mg Oral QHS  . polyethylene glycol  17 g Oral Daily  . QUEtiapine  50 mg Oral Daily  . senna-docusate  1 tablet Oral Daily  . tamsulosin  0.4 mg Oral QPC supper  . zinc sulfate  220 mg Oral Daily   Continuous Infusions: . cefTRIAXone (ROCEPHIN)  IV Stopped (08/13/16  0030)  . metronidazole Stopped (08/13/16 1448)  . vancomycin       LOS: 1 day   Time Spent in minutes   30 minutes  Germaine Shenker D.O. on 08/13/2016 at 1:20 PM  Between 7am to 7pm - Pager - [phone]  After 7pm go to [email or web address] - password [email or web address]  And look for the night coverage person covering for me after hours  [facility] Office  [phone]

## 2016-08-13 NOTE — Progress Notes (Signed)
ANTICOAGULATION CONSULT NOTE - Initial Consult  Pharmacy Consult for heparin Indication: Severe peripheral arterial disease and history of DVT  Allergies  Allergen Reactions  . Iohexol Hives  . Ivp Dye [Iodinated Diagnostic Agents] Hives    Patient Measurements: Height: 5\' 9"  (175.3 cm) Weight: 217 lb 13 oz (98.8 kg) IBW/kg (Calculated) : 70.7 Heparin Dosing Weight: 91 Kg  Vital Signs: Temp: 101.3 F (38.5 C) (06/18 0557) Temp Source: Oral (06/18 0557) BP: 149/89 (06/18 0557) Pulse Rate: 94 (06/18 0557)  Labs:  Recent Labs  08/12/16 1724 08/13/16 0434  HGB 9.8* 9.3*  HCT 30.5* 29.5*  PLT 263 240  CREATININE 1.33* 1.10    Estimated Creatinine Clearance: 82.7 mL/min (by C-G formula based on SCr of 1.1 mg/dL).   Medical History: Past Medical History:  Diagnosis Date  . Adrenal insufficiency ([place])   . Allergy   . Arthritis   . Blind left eye   . Carpal tunnel syndrome   . Cataract    bil removed  . CHF (congestive heart failure) ([place])   . Diabetes mellitus without complication ([place])   . ED (erectile dysfunction)   . Gout   . Herpes   . Hypercholesterolemia   . Hypertension   . MI (myocardial infarction) ([place])   . Nausea & vomiting   . Peripheral neuropathy   . Pneumonia   . PVD (peripheral vascular disease) ([place])      Assessment: 10 YOM presents with fever from SNF.  Patient has history of DVT on rivaroxaban prior to admission. Lower extremity U/S ABI reveals severe occlusive disease in RLE and critical disease in LLE.  Pharmacy asked to start heparin.  Last dose on rivaroxaban was 6/17 at ~2200.  Baseline INR = 1.71  APTT = 45 sec Heparin level = pending (lab running dilution)  Today, 08/13/2016   CBC: Hgb = 9.3, pltc WNL  Renal: SCr improved to WNL  Baseline coags elevated, expected heparin level to be elevated with recent xarelto  Goal of Therapy:  APTT 66-102 sec Heparin level 0.3-0.7 units/ml Monitor platelets by anticoagulation  protocol: Yes   Plan:   Check baseline coags. Check anti-Xa level since on rivaroxaban and suspect level will be elevated.   Based on last dose of rivaroxaban, start heparin (No bolus) at 22:00 tonight at rate of 1500 units/hr  Check labs in am  Daily heparin level, CBC, aPTT   Dose heparin gtt per aPTT until heparin level correlates  Doreene Eland, PharmD, BCPS.   Pager: [phone] 08/13/2016 11:39 AM

## 2016-08-13 NOTE — Progress Notes (Signed)
Inpatient Diabetes Program Recommendations  AACE/ADA: New Consensus Statement on Inpatient Glycemic Control (2015)  Target Ranges:  Prepandial:   less than 140 mg/dL      Peak postprandial:   less than 180 mg/dL (1-2 hours)      Critically ill patients:  140 - 180 mg/dL   Lab Results  Component Value Date   GLUCAP 206 (H) 08/13/2016   HGBA1C 11.0 (H) 03/29/2016    Review of Glycemic Control  Diabetes history: DM2 Outpatient Diabetes medications:  Toujeo 60 units QHS, Novolog 0-15 units tidwc  Current orders for Inpatient glycemic control: Lantus 60 units QHS, Novolog 0-15 units tidwc and hs + 3 units tidwc  Inpatient Diabetes Program Recommendations:    Need HgbA1C to assess glycemic control prior to admission. Last one 11% on 03/29/2016.   Will continue to follow.   Thank you. Lorenda Peck, RD, LDN, CDE Inpatient Diabetes Coordinator [phone]

## 2016-08-13 NOTE — Consult Note (Signed)
[place] Nurse wound consult note Reason for Consult:Bilateral heels with eschar, right lateral malleolar full thickness owund Wound type: Arterial insufficiency plus pressure Pressure Injury POA: Yes Measurement:Right heel with 3cm x 7cm black eschar Left heel with 10cm x 9cm black eschar Right lateral malleolus:  3cm x 2.4cm full thickness wound with yellow wound bed. Wound bed: As described above Drainage (amount, consistency, odor) Small amount from right malleolus, light yellow Both heels are malodorous, odor consistent with necrotic tissue. Periwound: Dry. intact Dressing procedure/placement/frequency: ABIs performed this am are indicative of severe arterial insufficiency.  I have implemented a conservative care plan using betadine to the bilateral heels and a saline dressing to the lateral malleolus. Patient's skin is intact on the sacrum and buttocks, he uses a male external urinary catheter for UI. Vascular (for reperfusion or amputation) or orthopedic consultation (for amputation)  May be indicated.  If you agree, please order/arrange. [place] nursing team will not follow, but will remain available to this patient, the nursing and medical teams.  Please re-consult if needed. Thanks, Maudie Flakes, MSN, RN, [place], Arther Abbott  Pager# [phone]

## 2016-08-13 NOTE — Progress Notes (Signed)
Plan for d/c to SNF, discharge planning per CSW. [phone] 

## 2016-08-13 NOTE — Progress Notes (Signed)
VASCULAR LAB PRELIMINARY  ARTERIAL  ABI completed: Right ABI of 0.39 is suggestive of severe arterial occlusive disease at rest. Left ABI of 0.29 is suggestive of critical arterial occlusive disease at rest.   RIGHT    LEFT    PRESSURE WAVEFORM  PRESSURE WAVEFORM  BRACHIAL 185 Triphasic BRACHIAL 163 Triphasic  DP 73 Monophasic DP 50 Monophasic  PT 72 Monophasic PT 54 Monophasic  GREAT TOE 32 NA GREAT TOE  NA    RIGHT LEFT  ABI 0.39 0.29   Results were given to the patient's nurse, Terri. The patient's physician will be informed and further testing will be ordered as needed.  Legrand Como, RVT 08/13/2016, 10:19 AM

## 2016-08-13 NOTE — Consult Note (Addendum)
[facility] for Infectious Disease    Date of Admission:  08/12/2016           Day 1 vancomycin        Day 1 ceftriaxone        Day 1 metronidazole       Reason for Consult: Bilateral foot osteomyelitis with polymicrobial bacteremia    Referring Physician: Dr. Cristal Ford  Recommendations: 1. Continue current antibiotics pending antibiotic susceptibility results 2. Repeat blood cultures in a.m.   Assessment: He has bilateral foot osteomyelitis complicated by polymicrobial bacteremia. I will continue current antibiotics pending final antibiotic susceptibility results. I do not feel that echocardiography is necessary at this time. I agree with vascular surgery evaluation.   Patient Active Problem List   Diagnosis Date Noted  . Osteomyelitis of left foot ([place]) 08/13/2016    Priority: High  . Osteomyelitis of right foot ([place]) 08/12/2016    Priority: High  . Enterococcal bacteremia 08/13/2016    Priority: Medium  . Bacteremia due to Escherichia coli 08/13/2016    Priority: Medium  . Proteus infection 08/13/2016    Priority: Medium  . History of CVA (cerebrovascular accident) 08/12/2016  . HLD (hyperlipidemia) 05/15/2016  . AKI (acute kidney injury) ([place]) 05/10/2016  . Pressure injury of skin 05/10/2016  . Dysphagia due to recent stroke 04/28/2016  . Hyperlipidemia associated with type 2 diabetes mellitus ([place]) 04/28/2016  . Depression 04/28/2016  . Cognitive and behavioral changes   . Cerebrovascular accident (CVA) due to embolism of left posterior cerebral artery ([place])   . Renovascular hypertension   . History of DVT (deep vein thrombosis)   . Coronary artery disease involving native coronary artery of native heart without angina pectoris   . Morbid obesity ([place])   . Normocytic anemia   . Embolic stroke involving posterior cerebral artery ([place]) 04/03/2016  . Brainstem stroke ([place]) 04/01/2016  . Abnormality of gait and mobility   . Cerebral infarction due  to thrombosis of other cerebral artery ([place])   . Right arm numbness   . Left sided numbness 03/29/2016  . TIA (transient ischemic attack) 03/29/2016  . Hypertensive urgency 03/29/2016  . Sepsis ([employer]) 09/10/2013  . Personal history of DVT (deep vein thrombosis) 01/01/2013  . Diabetes type 2, uncontrolled ([place]) 12/29/2012  . Hypertension   . Cardiomyopathy ([place])   . Chronic diastolic CHF (congestive heart failure) ([place])   . Peripheral neuropathy   . Special screening for malignant neoplasms, colon 12/09/2012   . amLODipine  10 mg Oral Daily  . atorvastatin  40 mg Oral q1800  . bethanechol  10 mg Oral TID  . carvedilol  25 mg Oral BID WC  . cloNIDine  0.3 mg Oral TID  . feeding supplement (GLUCERNA SHAKE)  237 mL Oral QID  . feeding supplement (PRO-STAT SUGAR FREE 64)  30 mL Oral BID  . FLUoxetine  10 mg Oral Daily  . insulin aspart  0-15 Units Subcutaneous TID WC  . insulin aspart  0-5 Units Subcutaneous QHS  . insulin aspart  3 Units Subcutaneous TID WC  . insulin glargine  60 Units Subcutaneous QHS  . isosorbide-hydrALAZINE  2 tablet Oral TID  . mirtazapine  7.5 mg Oral QHS  . polyethylene glycol  17 g Oral Daily  . QUEtiapine  50 mg Oral Daily  . senna-docusate  1 tablet Oral Daily  . tamsulosin  0.4 mg Oral QPC supper  . zinc sulfate  220 mg Oral Daily   HPI: [name] is a 61 y.o. male with diabetes, peripheral artery disease and peripheral neuropathy who has had bilateral foot ulcers for many months. He recently developed fever and was admitted yesterday with a temperature of 103.7 and foul-smelling necrotic areas on both feet. Admission blood cultures are growing enterococcus, Escherichia coli and Proteus. MRI scans show osteomyelitis of his left heel and right lateral malleolus.  Review of Systems: Review of Systems  Unable to perform ROS: Medical condition  Constitutional:       Because of his prior stroke he is unable to complete a full review of systems. He can  tell me that he is in considerable pain in his right foot.    Past Medical History:  Diagnosis Date  . Adrenal insufficiency ([place])   . Allergy   . Arthritis   . Blind left eye   . Carpal tunnel syndrome   . Cataract    bil removed  . CHF (congestive heart failure) ([place])   . Diabetes mellitus without complication ([place])   . ED (erectile dysfunction)   . Gout   . Herpes   . Hypercholesterolemia   . Hypertension   . MI (myocardial infarction) ([place])   . Nausea & vomiting   . Peripheral neuropathy   . Pneumonia   . PVD (peripheral vascular disease) ([place])     Social History  Substance Use Topics  . Smoking status: Never Smoker  . Smokeless tobacco: Never Used  . Alcohol use No    Family History  Problem Relation Age of Onset  . Breast cancer Mother   . Hypertension Father        blood clot in leg  . Diabetes Maternal Aunt   . Heart attack Sister   . Colon cancer Neg Hx   . Esophageal cancer Neg Hx   . Rectal cancer Neg Hx   . Stomach cancer Neg Hx    Allergies  Allergen Reactions  . Iohexol Hives  . Ivp Dye [Iodinated Diagnostic Agents] Hives    OBJECTIVE: Blood pressure (!) 149/89, pulse 94, temperature (!) 101.3 F (38.5 C), temperature source Oral, resp. rate 18, height 5\' 9"  (1.753 m), weight 217 lb 13 oz (98.8 kg), SpO2 98 %.  Physical Exam  Constitutional: He appears distressed.  Cardiovascular: Normal rate and regular rhythm.   No murmur heard. Pulmonary/Chest: Effort normal and breath sounds normal. No respiratory distress. He has no wheezes. He has no rales.  Abdominal: Soft. There is no tenderness.  Musculoskeletal:  From [facility] Nurse note: "Measurement:Right heel with 3cm x 7cm black eschar Left heel with 10cm x 9cm black eschar Right lateral malleolus:  3cm x 2.4cm full thickness wound with yellow wound bed. Wound bed: As described above Drainage (amount, consistency, odor) Small amount from right malleolus, light yellow Both heels are malodorous,  odor consistent with necrotic tissue.    Lab Results Lab Results  Component Value Date   WBC 9.6 08/13/2016   HGB 9.3 (L) 08/13/2016   HCT 29.5 (L) 08/13/2016   MCV 83.6 08/13/2016   PLT 240 08/13/2016    Lab Results  Component Value Date   CREATININE 1.10 08/13/2016   BUN 19 08/13/2016   NA 137 08/13/2016   K 3.5 08/13/2016   CL 104 08/13/2016   CO2 27 08/13/2016    Lab Results  Component Value Date   ALT 17 08/12/2016   AST 16 08/12/2016   ALKPHOS 75 08/12/2016  BILITOT 0.3 08/12/2016    Sed Rate (mm/hr)  Date Value  08/13/2016 135 (H)   CRP (mg/dL)  Date Value  08/13/2016 13.6 (H)   Microbiology: Recent Results (from the past 240 hour(s))  Blood culture (routine x 2)     Status: None (Preliminary result)   Collection Time: 08/12/16  5:29 PM  Result Value Ref Range Status   Specimen Description BLOOD LEFT ARM  Final   Special Requests BOTTLES DRAWN AEROBIC AND ANAEROBIC BCAV  Final   Culture  Setup Time   Final    GRAM NEGATIVE RODS GRAM POSITIVE COCCI IN PAIRS IN BOTH AEROBIC AND ANAEROBIC BOTTLES CRITICAL RESULT CALLED TO, READ BACK BY AND VERIFIED WITH: T GREEN,PHARMD AT 1151 08/13/16 BY L BENFIELD Performed at [facility], [place] [address], [place], [place] [address]    Culture Lonell Grandchild NEGATIVE RODS GRAM POSITIVE COCCI   Final   Report Status PENDING  Incomplete  Blood Culture ID Panel (Reflexed)     Status: Abnormal   Collection Time: 08/12/16  5:29 PM  Result Value Ref Range Status   Enterococcus species DETECTED (A) NOT DETECTED Final    Comment: CRITICAL RESULT CALLED TO, READ BACK BY AND VERIFIED WITH: T GREEN,PHARMD AT 1151 08/13/16 BY L BENFIELD    Vancomycin resistance NOT DETECTED NOT DETECTED Final   Listeria monocytogenes NOT DETECTED NOT DETECTED Final   Staphylococcus species NOT DETECTED NOT DETECTED Final   Staphylococcus aureus NOT DETECTED NOT DETECTED Final   Streptococcus species NOT DETECTED NOT DETECTED Final    Streptococcus agalactiae NOT DETECTED NOT DETECTED Final   Streptococcus pneumoniae NOT DETECTED NOT DETECTED Final   Streptococcus pyogenes NOT DETECTED NOT DETECTED Final   Acinetobacter baumannii NOT DETECTED NOT DETECTED Final   Enterobacteriaceae species DETECTED (A) NOT DETECTED Final    Comment: CRITICAL RESULT CALLED TO, READ BACK BY AND VERIFIED WITH: T GREEN,PHARMD AT 1151 08/13/16 BY L BENFIELD    Enterobacter cloacae complex NOT DETECTED NOT DETECTED Final   Escherichia coli DETECTED (A) NOT DETECTED Final    Comment: CRITICAL RESULT CALLED TO, READ BACK BY AND VERIFIED WITH: T GREEN,PHARMD AT 1151 08/13/16 BY L BENFIELD    Klebsiella oxytoca NOT DETECTED NOT DETECTED Final   Klebsiella pneumoniae NOT DETECTED NOT DETECTED Final   Proteus species DETECTED (A) NOT DETECTED Final    Comment: CRITICAL RESULT CALLED TO, READ BACK BY AND VERIFIED WITH: T GREEN,PHARMD AT 1151 08/13/16 BY L BENFIELD    Serratia marcescens NOT DETECTED NOT DETECTED Final   Carbapenem resistance NOT DETECTED NOT DETECTED Final   Haemophilus influenzae NOT DETECTED NOT DETECTED Final   Neisseria meningitidis NOT DETECTED NOT DETECTED Final   Pseudomonas aeruginosa NOT DETECTED NOT DETECTED Final   Candida albicans NOT DETECTED NOT DETECTED Final   Candida glabrata NOT DETECTED NOT DETECTED Final   Candida krusei NOT DETECTED NOT DETECTED Final   Candida parapsilosis NOT DETECTED NOT DETECTED Final   Candida tropicalis NOT DETECTED NOT DETECTED Final    Comment: Performed at [facility], [place]. [address], [place],  [address]  Blood culture (routine x 2)     Status: None (Preliminary result)   Collection Time: 08/12/16  5:53 PM  Result Value Ref Range Status   Specimen Description BLOOD RIGHT HAND  Final   Special Requests   Final    BOTTLES DRAWN AEROBIC AND ANAEROBIC Blood Culture adequate volume   Culture  Setup Time   Final  GRAM NEGATIVE RODS ANAEROBIC BOTTLE ONLY CRITICAL  RESULT CALLED TO, READ BACK BY AND VERIFIED WITH: E WILLIAMSON,PHARMD 1327 08/13/16 BY L BENFIELD Performed at [facility], [place] [address] [place], [place] [address]    Culture GRAM NEGATIVE RODS  Final   Report Status PENDING  Incomplete  MRSA PCR Screening     Status: None   Collection Time: 08/13/16  5:50 AM  Result Value Ref Range Status   MRSA by PCR NEGATIVE NEGATIVE Final    Comment:        The GeneXpert MRSA Assay (FDA approved for NASAL specimens only), is one component of a comprehensive MRSA colonization surveillance program. It is not intended to diagnose MRSA infection nor to guide or monitor treatment for MRSA infections.    Left foot MRI 08/13/2016  IMPRESSION: Large heel ulceration with signal change and gas in at least the posterior 4.5 cm of the calcaneus consistent with emphysematous osteomyelitis. Milder degree in the more distal calcaneus toward the calcaneocuboid joint could be due to osteomyelitis but has an appearance most suggestive of reactive change.  Intense subcutaneous edema about the ankle and visualized foot consistent with dependent change and/or cellulitis.  Mild intrasubstance of the distal most Achilles tendon consistent with tendinosis, possibly infectious given the patient's heel ulceration. Associated small volume of fluid in the retrocalcaneal bursa could be septic or aseptic.  Edema within all imaged intrinsic musculature the foot could be due to denervation atrophy and/or inflammatory change. No intramuscular abscess is seen.  Degenerative or neuropathic change about the midfoot.   Electronically Signed   By: Inge Rise M.D.   On: 08/13/2016 15:09  Right foot MRI 08/13/2016  IMPRESSION: Osteomyelitis of the lateral malleolus of the distal fibula.   Electronically Signed   By: Lorriane Shire M.D.   On: 08/13/2016 14:35  Michel Bickers, MD [facility] for Infectious [place]  Group [phone] pager   [phone] cell 08/13/2016, 3:36 PM

## 2016-08-14 DIAGNOSIS — Z7189 Other specified counseling: Secondary | ICD-10-CM

## 2016-08-14 DIAGNOSIS — Z515 Encounter for palliative care: Secondary | ICD-10-CM

## 2016-08-14 DIAGNOSIS — A498 Other bacterial infections of unspecified site: Secondary | ICD-10-CM

## 2016-08-14 DIAGNOSIS — Z8673 Personal history of transient ischemic attack (TIA), and cerebral infarction without residual deficits: Secondary | ICD-10-CM

## 2016-08-14 LAB — CBC
HCT: 29.4 % — ABNORMAL LOW (ref 39.0–52.0)
Hemoglobin: 9.8 g/dL — ABNORMAL LOW (ref 13.0–17.0)
MCH: 27.9 pg (ref 26.0–34.0)
MCHC: 33.3 g/dL (ref 30.0–36.0)
MCV: 83.8 fL (ref 78.0–100.0)
PLATELETS: 282 10*3/uL (ref 150–400)
RBC: 3.51 MIL/uL — AB (ref 4.22–5.81)
RDW: 14 % (ref 11.5–15.5)
WBC: 9.6 10*3/uL (ref 4.0–10.5)

## 2016-08-14 LAB — APTT
APTT: 55 s — AB (ref 24–36)
aPTT: 92 seconds — ABNORMAL HIGH (ref 24–36)
aPTT: 42 seconds — ABNORMAL HIGH (ref 24–36)

## 2016-08-14 LAB — BASIC METABOLIC PANEL
ANION GAP: 8 (ref 5–15)
BUN: 18 mg/dL (ref 6–20)
CALCIUM: 8.4 mg/dL — AB (ref 8.9–10.3)
CO2: 25 mmol/L (ref 22–32)
Chloride: 105 mmol/L (ref 101–111)
Creatinine, Ser: 0.97 mg/dL (ref 0.61–1.24)
Glucose, Bld: 184 mg/dL — ABNORMAL HIGH (ref 65–99)
POTASSIUM: 3.6 mmol/L (ref 3.5–5.1)
SODIUM: 138 mmol/L (ref 135–145)

## 2016-08-14 LAB — SEDIMENTATION RATE: Sed Rate: 125 mm/hr — ABNORMAL HIGH (ref 0–16)

## 2016-08-14 LAB — GLUCOSE, CAPILLARY
GLUCOSE-CAPILLARY: 145 mg/dL — AB (ref 65–99)
GLUCOSE-CAPILLARY: 199 mg/dL — AB (ref 65–99)
Glucose-Capillary: 142 mg/dL — ABNORMAL HIGH (ref 65–99)
Glucose-Capillary: 151 mg/dL — ABNORMAL HIGH (ref 65–99)

## 2016-08-14 LAB — C-REACTIVE PROTEIN: CRP: 13.9 mg/dL — AB (ref <1.0)

## 2016-08-14 LAB — HEPARIN LEVEL (UNFRACTIONATED): HEPARIN UNFRACTIONATED: 0.53 [IU]/mL (ref 0.30–0.70)

## 2016-08-14 MED ORDER — HEPARIN (PORCINE) IN NACL 100-0.45 UNIT/ML-% IJ SOLN
2000.0000 [IU]/h | INTRAMUSCULAR | Status: DC
  Administered 2016-08-14 (×2): 1650 [IU]/h via INTRAVENOUS
  Administered 2016-08-15: 1800 [IU]/h via INTRAVENOUS
  Administered 2016-08-15 – 2016-08-16 (×2): 2000 [IU]/h via INTRAVENOUS
  Filled 2016-08-14 (×5): qty 250

## 2016-08-14 MED ORDER — SODIUM CHLORIDE 0.9 % IV SOLN: 25.0000 mg | Freq: Once | INTRAVENOUS | Status: DC

## 2016-08-14 MED ORDER — SODIUM CHLORIDE 0.9 % IV SOLN
125.0000 mg | Freq: Once | INTRAVENOUS | Status: AC
  Administered 2016-08-14: 125 mg via INTRAVENOUS
  Filled 2016-08-14: qty 10

## 2016-08-14 MED ORDER — VANCOMYCIN HCL IN DEXTROSE 1-5 GM/200ML-% IV SOLN
1000.0000 mg | Freq: Two times a day (BID) | INTRAVENOUS | Status: DC
  Administered 2016-08-14 – 2016-08-17 (×7): 1000 mg via INTRAVENOUS
  Filled 2016-08-14 (×8): qty 200

## 2016-08-14 NOTE — Consult Note (Signed)
Consultation Note Date: 08/14/2016   Patient Name: [name]  DOB: 03-11-1955  MRN: [id number]  Age / Sex: 61 y.o., male  PCP: Maury Dus, MD Referring Physician: Serafina Mitchell, MD  Reason for Consultation: Establishing goals of care  HPI/Patient Profile: 61 y.o. male  with past medical history of DM2, HT N, dCHF, CAD s/p stenting, PAD, prior DVT on anticoagulation, depression, and prior CVA with dysarthria. He has been followed in the outpatient setting for PAD with non-healing wounds on both feet. He then presented to the ED from SNF with fever. He was admitted on 08/12/2016. Work-up revealed sepsis secondary to osteomyelitis of feet. Vascular and ID following. Palliative consulted to assist in goals of care clarification.  Clinical Assessment and Goals of Care: I met [name] at his bedside, there was no family present. Mr. Kruer was able to tell me his name and that he was at a hospital, he was confused on the date. Despite multiple simple questions, he was also unable to tell me why he was here, or any health history. He required multiple verbal cues to answer questions, and predominantly stared at me without answering. He was able to follow simple commands with repeat cues and visual demonstration.   I called and spoke with his daughter, [name], over the phone. She is his HCPOA and is a Marine scientist. She had an excellent understanding of his health issues and was able to detail the course of his PAD and the treatment approaches to date. I provided an update of his hospitalization, including explaining the newly diagnosed bilateral osteomyelitis. I also explained the plan to transfer to [facility] and have vascular weigh-in on treatment options. I did forewarn that amputation might be considered. [name] was receptive to this plan. She would like more information on what options are available, and what the likely outcome of these options are. She is hopeful that  something can be done to prolong his life, but also doesn't want to prolong his suffering.   Primary Decision [employer], pt's daughter: [name].   SUMMARY OF RECOMMENDATIONS    Full code, I did not have a chance to discuss this with [name], will address at next meeting  [name] okay with transfer to [facility], she would like more information from Vascular about treatment options and likely outcomes  Palliative will continue to follow and will continue to support pt/family as more information is available  Code Status/Advance Care Planning:  Full code  Symptom Management:   Significant pain in right foot: PRN Norco   Palliative Prophylaxis:   Frequent Pain Assessment, Oral Care, Palliative Wound Care and Turn Reposition  Additional Recommendations (Limitations, Scope, Preferences):  TBD  Psycho-social/Spiritual:   Desire for further Chaplaincy support:no  Additional Recommendations: TBD  Prognosis:   Unable to determine  Discharge Planning: To Be Determined      Primary Diagnoses: Present on Admission: . Diabetes type 2, uncontrolled ([place]) . Hypertension . AKI (acute kidney injury) ([place]) . Pressure injury of skin . Coronary artery disease involving native coronary artery of native heart without angina pectoris . Osteomyelitis of right foot ([place]) . Chronic diastolic CHF (congestive heart failure) ([place]) . Cognitive and behavioral changes . Enterococcal bacteremia . Bacteremia due to Escherichia coli . Proteus infection . Osteomyelitis of left foot ([place])   I have reviewed the medical record, interviewed the patient and family, and examined the patient. The following aspects are pertinent.  Past Medical History:  Diagnosis Date  . Adrenal insufficiency ([place])   .  Allergy   . Arthritis   . Blind left eye   . Carpal tunnel syndrome   . Cataract    bil removed  . CHF (congestive heart failure) ([place])   . Diabetes mellitus without complication ([place])   . ED  (erectile dysfunction)   . Gout   . Herpes   . Hypercholesterolemia   . Hypertension   . MI (myocardial infarction) ([place])   . Nausea & vomiting   . Peripheral neuropathy   . Pneumonia   . PVD (peripheral vascular disease) ([place])    Social History   Social History  . Marital status: Married    Spouse name: N/A  . Number of children: N/A  . Years of education: N/A   Social History Main Topics  . Smoking status: Never Smoker  . Smokeless tobacco: Never Used  . Alcohol use No  . Drug use: No  . Sexual activity: No   Other Topics Concern  . None   Social History Narrative  . None   Family History  Problem Relation Age of Onset  . Breast cancer Mother   . Hypertension Father        blood clot in leg  . Diabetes Maternal Aunt   . Heart attack Sister   . Colon cancer Neg Hx   . Esophageal cancer Neg Hx   . Rectal cancer Neg Hx   . Stomach cancer Neg Hx    Scheduled Meds: . amLODipine  10 mg Oral Daily  . atorvastatin  40 mg Oral q1800  . bethanechol  10 mg Oral TID  . carvedilol  25 mg Oral BID WC  . cloNIDine  0.3 mg Oral TID  . feeding supplement (GLUCERNA SHAKE)  237 mL Oral QID  . feeding supplement (PRO-STAT SUGAR FREE 64)  30 mL Oral BID  . FLUoxetine  10 mg Oral Daily  . insulin aspart  0-15 Units Subcutaneous TID WC  . insulin aspart  0-5 Units Subcutaneous QHS  . insulin aspart  3 Units Subcutaneous TID WC  . insulin glargine  60 Units Subcutaneous QHS  . isosorbide-hydrALAZINE  2 tablet Oral TID  . mirtazapine  7.5 mg Oral QHS  . polyethylene glycol  17 g Oral Daily  . QUEtiapine  50 mg Oral Daily  . senna-docusate  1 tablet Oral Daily  . tamsulosin  0.4 mg Oral QPC supper  . zinc sulfate  220 mg Oral Daily   Continuous Infusions: . cefTRIAXone (ROCEPHIN)  IV Stopped (08/14/16 0117)  . ferric gluconate (FERRLECIT/NULECIT) IV    . heparin 1,650 Units/hr (08/14/16 0704)  . metronidazole 500 mg (08/14/16 0650)  . vancomycin 1,000 mg (08/14/16 0803)     PRN Meds:.acetaminophen **OR** acetaminophen, albuterol, bisacodyl, HYDROcodone-acetaminophen, hydrocortisone, lidocaine, ondansetron **OR** ondansetron (ZOFRAN) IV, sodium phosphate Allergies  Allergen Reactions  . Iohexol Hives  . Ivp Dye [Iodinated Diagnostic Agents] Hives   Review of Systems  -Limited as pt minimally verbally interactive. States that his right foot hurts.  Physical Exam  Constitutional: He appears well-developed and well-nourished. No distress.  HENT:  Head: Normocephalic and atraumatic.  Mouth/Throat: Oropharynx is clear and moist. Abnormal dentition. No oropharyngeal exudate.  Eyes: EOM are normal.  Vision impaired, unclear on extent  Neck: Normal range of motion. Neck supple.  Cardiovascular: Normal rate and regular rhythm.   Pulmonary/Chest: Effort normal and breath sounds normal. No respiratory distress.  Abdominal: Soft. Bowel sounds are normal.  Musculoskeletal:  Difficult to assess, spontaneously moving all extremities  laterally, but minimally against gravity. Generalized weakness.   Neurological:  Alert. States name and knows that he is at a hospital, unclear on which one. Confused on time. No response when asked about situation. Follows simple commands with multiple repeat verbal cues and demonstration.  Skin: Skin is warm and dry.  Wound dressings on bilateral feet, could not visualize wound. Feet with significant odor. Dressings CDI.   Psychiatric: His speech is delayed. He is slowed. Cognition and memory are impaired.    Vital Signs: BP (!) 171/67 (BP Location: Right Arm)   Pulse 81   Temp 99 F (37.2 C)   Resp 16   Ht _0  (1.753 m)   Wt 100.7 kg (222 lb 0.1 oz)   SpO2 96%   BMI 32.78 kg/m  Pain Assessment: PAINAD   Pain Score: Asleep   SpO2: SpO2: 96 % O2 Device:SpO2: 96 % O2 Flow Rate: .   IO: Intake/output summary:  Intake/Output Summary (Last 24 hours) at 08/14/16 1152 Last data filed at 08/14/16 1100  Gross per 24 hour   Intake           1933.9 ml  Output             1700 ml  Net            233.9 ml    LBM: Last BM Date: 08/13/16 Baseline Weight: Weight: 104.3 kg (230 lb) Most recent weight: Weight: 100.7 kg (222 lb 0.1 oz)     Palliative Assessment/Data: PPS 30-40%    Time Total: 50 minutes Greater than 50%  of this time was spent counseling and coordinating care related to the above assessment and plan.  Signed by: Charlynn Court, NP Palliative Medicine Team Pager # [phone] (M-F 7a-5p) Team Phone # [phone] (Nights/Weekends)

## 2016-08-14 NOTE — Progress Notes (Signed)
CSW spoke with pt's daughter this am to confirm dc plan. Daughter reports that medicaid application has been completed and submitted to DSS. Daughter reports that she provided pending medicaid # to receptionist at [employer] [employer] and requested info be given to business office. CSW spoke with Admissions Coordinator at SNF and provided update. AC at SNF will update Administrator and contact CSW with admissions decision. AC reports that pt has a bill for past stay that also needs to be settled. CSW will continue to follow to assist with d/c planning needs.  Werner Lean LCSW [phone]

## 2016-08-14 NOTE — Progress Notes (Signed)
08/14/2016 1715 Received pt to room2W03 from [facility].  Pt is A&O, no C/o voiced.  Tele monitor applied and CCMD notified.  Oriented pt and family to room, call light and bed.  Call bell in reach, family at bedside. [place]

## 2016-08-14 NOTE — Progress Notes (Signed)
Patient ID: [name], male   DOB: 15-Jan-1956, 61 y.o.   MRN: [id number]          [facility] for Infectious Disease    Date of Admission:  08/12/2016   Total days of antibiotics 2         Febrile but otherwise stable on empiric therapy for bilateral foot osteomyelitis complicated by polymicrobial bacteremia. I will continue his current antibiotic regimen pending antibiotic susceptibility results. He will be transferring to [facility]. We will continue to follow him there.         Michel Bickers, MD [facility] for Infectious [place] Group [phone] pager   [phone] cell 08/14/2016, 2:17 PM

## 2016-08-14 NOTE — Progress Notes (Addendum)
PROGRESS NOTE    [name]  MWN:[id number] DOB: May 30, 1955 DOA: 08/12/2016 PCP: Maury Dus, MD   Chief Complaint  Patient presents with  . Fever     Brief Narrative:  HPI on 08/12/2016 by Dr. Christia Reading Opyd [name] is a 61 y.o. male with medical history significant for insulin-dependent diabetes mellitus, hypertension, chronic diastolic CHF, history of DVT, CAD with stents, peripheral arterial disease, and history of CVA with residual dysarthria, dysphagia, and weakness, now presenting from his SNF for evaluation of fevers. Patient had reportedly been in his usual state of health until he was noted to have a temperature of 103.7 F this afternoon at the nursing home. He was treated with Tylenol and EMS was called for transport to the hospital. There has not been any recent cough, vomiting, or diarrhea. Patient has not been voicing any specific complaints. He has been receiving wound care for bilateral foot ulcers. He has been following with vascular surgery in the outpatient setting for peripheral arterial disease and nonhealing foot wounds bilaterally. He is accompanied by his daughter who assists with the history. Aside from Tylenol, no other interventions were attempted prior to coming into the ED.  Interim history Obtained ABI showing critical/severe disease. Vascular surgery consulted and appreciated, would like patient to be transferred to [facility] for possible intervention later this week.  Assessment & Plan   Sepsis secondary to osteomyelitis of the feet, heel ulcers/Bacteremia -Patient presented with fever and tachycardia -Patient placed on vancomycin, ceftriaxone, Flagyl -ABIs ordered- see below -Wound care consulted and appreciated -Blood cultures show Escherichia coli, enterococcus, Proteus -Infectious disease consulted and appreciated, recommended continuing antibiotics and repeating blood cultures. Echocardiogram not necessary at this time. -MRI right foot showed  osteomyelitis of the lateral malleolus of the distal fibula. -MRI of the left foot showed large ulceration with signal change and gas in the posterior of the calcaneus consistent with emphysematous osteomyelitis.  -Discussed with Dr. Donzetta Matters, vascular surgery, will transfer patient to [facility] for intervention/AKA later this week. Recommended possible palliative care consult for goals of care.   PAD -Patient follows with vascular surgery -ABI obtained, the right is 0.39 severe, left is her 0.29 suggestive of critical arterial occlusion -Continue statin -MRI as above -Vascular surgery consulted and appreciated- see discussino above  Normocytic anemia -Baseline hemoglobin approximately 12, however noted to be 10 and April 2018 -Currently 9.8 -Anemia past showed iron of 10, ferritin 367, saturation ratio 7 -Will start iron supplementation  Coronary artery disease -Currently no colitis chest pain -Patient had stent to the RCA in 2007 -Continue Coreg, statin, bidil  History of DVT -continue anticoagulation, however will discontinue Xarelto and placed on heparin  Acute kidney injury -Upon admission, creatinine 1.34. Baseline approximate 0.9 -Suspect secondary to infection and prerenal causes -Currently 0.97 -Continue to monitor BMP  Chronic Diastolic heart failure -Last echocardiogram 05/11/2016 showed EF 40%, grade 1 diastolic dysfunction, mild LAE, severe LVH -Continue Coreg. Lasix held -Currently euvolemic  -Monitor intake, output, daily weights  Dysphagia -Suppose a choking on water emergency department -Possibly secondary to prior CVA -Speech consulted and evaluated, continue thickened liquids, dysphagia diet for now  Essential hypertension -Continue Coreg, hydralazine, Bidil, amolodipine   Diabetes mellitus, type II -Hemoglobin A1c was 11, and February 2018 -Patient uses Toujeo at home with novolog -Continue Lantus, and some fine scale, CBG  monitoring  Depression -Continue Prozac  DVT Prophylaxis  Xarelto, changed to heparin  Code Status: Full  Family Communication: None at  bedside  Disposition Plan: Admitted. Pending further workup, MRI feet.   Consultants Infectious disease  Procedures  ABI  Antibiotics   Anti-infectives    Start     Dose/Rate Route Frequency Ordered Stop   08/14/16 0800  vancomycin (VANCOCIN) IVPB 1000 mg/200 mL premix     1,000 mg 200 mL/hr over 60 Minutes Intravenous Every 12 hours 08/14/16 0653     08/13/16 1800  vancomycin (VANCOCIN) 1,250 mg in sodium chloride 0.9 % 250 mL IVPB  Status:  Discontinued     1,250 mg 166.7 mL/hr over 90 Minutes Intravenous Every 24 hours 08/12/16 1845 08/14/16 0653   08/13/16 0000  piperacillin-tazobactam (ZOSYN) IVPB 3.375 g  Status:  Discontinued     3.375 g 12.5 mL/hr over 240 Minutes Intravenous Every 8 hours 08/12/16 1845 08/12/16 1948   08/13/16 0000  cefTRIAXone (ROCEPHIN) 2 g in dextrose 5 % 50 mL IVPB     2 g 100 mL/hr over 30 Minutes Intravenous Every 24 hours 08/12/16 1948     08/12/16 2200  metroNIDAZOLE (FLAGYL) IVPB 500 mg     500 mg 100 mL/hr over 60 Minutes Intravenous Every 8 hours 08/12/16 1948     08/12/16 1815  vancomycin (VANCOCIN) 2,000 mg in sodium chloride 0.9 % 500 mL IVPB     2,000 mg 250 mL/hr over 120 Minutes Intravenous  Once 08/12/16 1804 08/12/16 2128   08/12/16 1800  piperacillin-tazobactam (ZOSYN) IVPB 3.375 g     3.375 g 100 mL/hr over 30 Minutes Intravenous  Once 08/12/16 1748 08/12/16 1924   08/12/16 1800  vancomycin (VANCOCIN) IVPB 1000 mg/200 mL premix  Status:  Discontinued     1,000 mg 200 mL/hr over 60 Minutes Intravenous  Once 08/12/16 1748 08/12/16 1804      Subjective:   [name] seen and examined today.   Patient minimally interactive today. No complaints.  Objective:   Vitals:   08/14/16 0500 08/14/16 0523 08/14/16 0651 08/14/16 0752  BP:  (!) 161/71  (!) 171/67  Pulse:  90  81  Resp:  16  16   Temp:  (!) 101.4 F (38.6 C) 100 F (37.8 C) 99 F (37.2 C)  TempSrc:  Oral    SpO2:  96%    Weight: 100.7 kg (222 lb 0.1 oz)     Height:        Intake/Output Summary (Last 24 hours) at 08/14/16 1125 Last data filed at 08/14/16 1100  Gross per 24 hour  Intake           1933.9 ml  Output             1700 ml  Net            233.9 ml   Filed Weights   08/12/16 2116 08/13/16 0712 08/14/16 0500  Weight: 97 kg (213 lb 13.5 oz) 98.8 kg (217 lb 13 oz) 100.7 kg (222 lb 0.1 oz)   Exam  General: Well developed, well nourished, NAD, appears stated age  HEENT: NCAT, mucous membranes moist.   Cardiovascular: S1 S2 auscultated, RRR  Respiratory: Clear to auscultation bilaterally with equal chest rise  Abdomen: Soft, nontender, nondistended, + bowel sounds  Extremities: warm dry without cyanosis clubbing. Trace LE edema. Feet in prevnar boots  Neuro: AAOx3, nonfocal, limited interaction  Psych: Flat affect  Data Reviewed: I have personally reviewed following labs and imaging studies  CBC:  Recent Labs Lab 08/12/16 1724 08/13/16 0434 08/14/16 0514  WBC 8.7 9.6  9.6  NEUTROABS 6.9 6.6  --   HGB 9.8* 9.3* 9.8*  HCT 30.5* 29.5* 29.4*  MCV 84.5 83.6 83.8  PLT 263 240 431   Basic Metabolic Panel:  Recent Labs Lab 08/12/16 1724 08/13/16 0434 08/14/16 0514  NA 135 137 138  K 3.6 3.5 3.6  CL 100* 104 105  CO2 26 27 25   GLUCOSE 313* 227* 184*  BUN 22* 19 18  CREATININE 1.33* 1.10 0.97  CALCIUM 8.4* 8.1* 8.4*   GFR: Estimated Creatinine Clearance: 94.7 mL/min (by C-G formula based on SCr of 0.97 mg/dL). Liver Function Tests:  Recent Labs Lab 08/12/16 1724  AST 16  ALT 17  ALKPHOS 75  BILITOT 0.3  PROT 7.5  ALBUMIN 2.5*   No results for input(s): LIPASE, AMYLASE in the last 168 hours. No results for input(s): AMMONIA in the last 168 hours. Coagulation Profile:  Recent Labs Lab 08/13/16 1203  INR 1.71   Cardiac Enzymes: No results for input(s):  CKTOTAL, CKMB, CKMBINDEX, TROPONINI in the last 168 hours. BNP (last 3 results) No results for input(s): PROBNP in the last 8760 hours. HbA1C: No results for input(s): HGBA1C in the last 72 hours. CBG:  Recent Labs Lab 08/13/16 0744 08/13/16 1520 08/13/16 2147 08/14/16 0747 08/14/16 0950  GLUCAP 206* 212* 291* 145* 142*   Lipid Profile: No results for input(s): CHOL, HDL, LDLCALC, TRIG, CHOLHDL, LDLDIRECT in the last 72 hours. Thyroid Function Tests: No results for input(s): TSH, T4TOTAL, FREET4, T3FREE, THYROIDAB in the last 72 hours. Anemia Panel:  Recent Labs  08/13/16 1504  VITAMINB12 416  FOLATE 20.4  FERRITIN 367*  TIBC 141*  IRON 10*  RETICCTPCT 0.6   Urine analysis:    Component Value Date/Time   COLORURINE AMBER (A) 08/13/2016 1237   APPEARANCEUR CLOUDY (A) 08/13/2016 1237   LABSPEC 1.014 08/13/2016 1237   PHURINE 5.0 08/13/2016 1237   GLUCOSEU 50 (A) 08/13/2016 1237   HGBUR NEGATIVE 08/13/2016 1237   BILIRUBINUR NEGATIVE 08/13/2016 1237   KETONESUR NEGATIVE 08/13/2016 1237   PROTEINUR 100 (A) 08/13/2016 1237   UROBILINOGEN 0.2 07/12/2014 1228   NITRITE NEGATIVE 08/13/2016 1237   LEUKOCYTESUR NEGATIVE 08/13/2016 1237   Sepsis Labs: @LABRCNTIP (procalcitonin:4,lacticidven:4)  ) Recent Results (from the past 240 hour(s))  Blood culture (routine x 2)     Status: None (Preliminary result)   Collection Time: 08/12/16  5:29 PM  Result Value Ref Range Status   Specimen Description BLOOD LEFT ARM  Final   Special Requests BOTTLES DRAWN AEROBIC AND ANAEROBIC BCAV  Final   Culture  Setup Time   Final    GRAM NEGATIVE RODS GRAM POSITIVE COCCI IN PAIRS IN BOTH AEROBIC AND ANAEROBIC BOTTLES CRITICAL RESULT CALLED TO, READ BACK BY AND VERIFIED WITH: T GREEN,PHARMD AT 1151 08/13/16 BY L BENFIELD Performed at [facility], [place] [address], [place], [place] [address]    Culture Lonell Grandchild NEGATIVE RODS GRAM POSITIVE COCCI   Final   Report Status PENDING   Incomplete  Blood Culture ID Panel (Reflexed)     Status: Abnormal   Collection Time: 08/12/16  5:29 PM  Result Value Ref Range Status   Enterococcus species DETECTED (A) NOT DETECTED Final    Comment: CRITICAL RESULT CALLED TO, READ BACK BY AND VERIFIED WITH: T GREEN,PHARMD AT 1151 08/13/16 BY L BENFIELD    Vancomycin resistance NOT DETECTED NOT DETECTED Final   Listeria monocytogenes NOT DETECTED NOT DETECTED Final   Staphylococcus species NOT DETECTED NOT DETECTED Final  Staphylococcus aureus NOT DETECTED NOT DETECTED Final   Streptococcus species NOT DETECTED NOT DETECTED Final   Streptococcus agalactiae NOT DETECTED NOT DETECTED Final   Streptococcus pneumoniae NOT DETECTED NOT DETECTED Final   Streptococcus pyogenes NOT DETECTED NOT DETECTED Final   Acinetobacter baumannii NOT DETECTED NOT DETECTED Final   Enterobacteriaceae species DETECTED (A) NOT DETECTED Final    Comment: CRITICAL RESULT CALLED TO, READ BACK BY AND VERIFIED WITH: T GREEN,PHARMD AT 1151 08/13/16 BY L BENFIELD    Enterobacter cloacae complex NOT DETECTED NOT DETECTED Final   Escherichia coli DETECTED (A) NOT DETECTED Final    Comment: CRITICAL RESULT CALLED TO, READ BACK BY AND VERIFIED WITH: T GREEN,PHARMD AT 1151 08/13/16 BY L BENFIELD    Klebsiella oxytoca NOT DETECTED NOT DETECTED Final   Klebsiella pneumoniae NOT DETECTED NOT DETECTED Final   Proteus species DETECTED (A) NOT DETECTED Final    Comment: CRITICAL RESULT CALLED TO, READ BACK BY AND VERIFIED WITH: T GREEN,PHARMD AT 1151 08/13/16 BY L BENFIELD    Serratia marcescens NOT DETECTED NOT DETECTED Final   Carbapenem resistance NOT DETECTED NOT DETECTED Final   Haemophilus influenzae NOT DETECTED NOT DETECTED Final   Neisseria meningitidis NOT DETECTED NOT DETECTED Final   Pseudomonas aeruginosa NOT DETECTED NOT DETECTED Final   Candida albicans NOT DETECTED NOT DETECTED Final   Candida glabrata NOT DETECTED NOT DETECTED Final   Candida krusei NOT  DETECTED NOT DETECTED Final   Candida parapsilosis NOT DETECTED NOT DETECTED Final   Candida tropicalis NOT DETECTED NOT DETECTED Final    Comment: Performed at [facility], [place] [address] [place], [place] [address]  Blood culture (routine x 2)     Status: None (Preliminary result)   Collection Time: 08/12/16  5:53 PM  Result Value Ref Range Status   Specimen Description BLOOD RIGHT HAND  Final   Special Requests   Final    BOTTLES DRAWN AEROBIC AND ANAEROBIC Blood Culture adequate volume   Culture  Setup Time   Final    GRAM NEGATIVE RODS IN BOTH AEROBIC AND ANAEROBIC BOTTLES CRITICAL RESULT CALLED TO, READ BACK BY AND VERIFIED WITH: E WILLIAMSON,PHARMD 1327 08/13/16 BY L BENFIELD GRAM POSITIVE COCCI IN PAIRS AEROBIC BOTTLE ONLY Performed at [facility], [place] [address], [place], [place] [address]    Culture GRAM NEGATIVE RODS GRAM POSITIVE COCCI   Final   Report Status PENDING  Incomplete  MRSA PCR Screening     Status: None   Collection Time: 08/13/16  5:50 AM  Result Value Ref Range Status   MRSA by PCR NEGATIVE NEGATIVE Final    Comment:        The GeneXpert MRSA Assay (FDA approved for NASAL specimens only), is one component of a comprehensive MRSA colonization surveillance program. It is not intended to diagnose MRSA infection nor to guide or monitor treatment for MRSA infections.       Radiology Studies: Dg Chest 2 View  Result Date: 08/12/2016 CLINICAL DATA:  Initial evaluation for acute fever. EXAM: CHEST  2 VIEW COMPARISON:  Prior radiograph and CT from 05/10/2016. FINDINGS: Mild cardiomegaly, stable.  Mediastinal silhouette normal. Lungs hypoinflated. Mild perihilar vascular congestion without overt pulmonary edema. Patchy and linear bibasilar opacities, favored to reflect atelectasis and/ or bronchovascular crowding, although superimposed infiltrate not entirely excluded, particularly at the left lung base. No pleural effusion. No pneumothorax. No  acute osseous abnormality. IMPRESSION: 1. Shallow lung inflation with mild patchy and linear bibasilar  opacities. Atelectasis/bronchovascular crowding is favored, although superimposed infiltrates could be considered in the correct clinical setting, particularly at the left lung base. 2. Cardiomegaly with mild perihilar vascular congestion without overt pulmonary edema. Electronically Signed   By: Jeannine Boga M.D.   On: 08/12/2016 18:07   Dg Ankle Complete Left  Result Date: 08/12/2016 CLINICAL DATA:  Initial evaluation for soft tissue ulceration at heel. Rule out osteo. EXAM: LEFT ANKLE COMPLETE - 3+ VIEW COMPARISON:  None. FINDINGS: Soft tissue irregularity at the plantar aspect of the heel, consistent with history of ulceration. Few scatter locular is of soft tissue emphysema present within this region due to ulceration. No radiopaque foreign body. No radiographic findings to suggest osteomyelitis. No acute fracture dislocation. Ankle mortise approximated. Vascular calcifications noted within the lower leg. IMPRESSION: 1. Soft tissue irregularity at the heel, compatible with history of ulceration in this region. Few scatter locked fills of soft tissue emphysema related to ulceration without radiographic evidence for osteomyelitis. No radiopaque foreign body. 2. No acute osseous abnormality about the ankle. Electronically Signed   By: Jeannine Boga M.D.   On: 08/12/2016 18:09   Dg Ankle Complete Right  Result Date: 08/12/2016 CLINICAL DATA:  Initial evaluation for heel ulcer. Rule out osteomyelitis. EXAM: RIGHT ANKLE - COMPLETE 3+ VIEW COMPARISON:  None. FINDINGS: No acute fracture or dislocation.  Ankle mortise approximated. Soft tissue irregularity at overlying the lateral malleolus suspicious for ulceration. There is underlying cortical disruption of the distal fibula, suspicious for possible osteomyelitis. No frank periosteal reaction. No dissecting soft tissue emphysema. No radiopaque  foreign body. IMPRESSION: 1. Soft tissue irregularity overlying the lateral malleolus, consistent with ulceration. Underlying cortical erosion suspicious for osteomyelitis. 2. No other acute osseous abnormality about the ankle. Electronically Signed   By: Jeannine Boga M.D.   On: 08/12/2016 18:13   Mr Foot Right Wo Contrast  Result Date: 08/13/2016 CLINICAL DATA:  NONHEALING DIABETIC SOFT TISSUE ULCER IN EXAM: MRI OF THE RIGHT HINDFOOT WITHOUT CONTRAST TECHNIQUE: Multiplanar, multisequence MR imaging of the ankle was performed. No intravenous contrast was administered. COMPARISON:  RADIOGRAPHS DATED 08/12/2016 FINDINGS: TENDONS Peroneal: Intact peroneus longus and peroneus brevis tendons. Posteromedial: Intact tibialis posterior, flexor hallucis longus and flexor digitorum longus tendons. Anterior: Intact tibialis anterior, extensor hallucis longus and extensor digitorum longus tendons. Achilles: Intact. Plantar Fascia: Normal. LIGAMENTS Lateral: Intact. Medial: Intact. CARTILAGE Ankle Joint: Small nonspecific ankle joint effusion. No chondral defect. Subtalar Joints/Sinus Tarsi: No joint effusion or chondral defect. Bones: Abnormal edema and erosion of the superficial cortex of the lateral aspect of the lateral malleolus consistent with osteomyelitis. Overlying soft tissue ulceration. There is periosteal reaction extending 4 cm proximal to the tip of the lateral malleolus. Other: Prominent soft tissue ulceration over the lateral malleolus. Suggestion of soft tissue ulceration posterior laterally adjacent to the posterior calcaneus. No underlying osteomyelitis at that site. IMPRESSION: Osteomyelitis of the lateral malleolus of the distal fibula. Electronically Signed   By: Lorriane Shire M.D.   On: 08/13/2016 14:35   Mr Foot Left Wo Contrast  Result Date: 08/13/2016 CLINICAL DATA:  Diabetic patient with a nonhealing ulcer on the left heel. EXAM: MRI OF THE LEFT FOOT WITHOUT CONTRAST TECHNIQUE:  Multiplanar, multisequence MR imaging of the ankle was performed. No intravenous contrast was administered. COMPARISON:  Plain films left ankle 08/12/2016. FINDINGS: TENDONS Peroneal: Intact. Posteromedial: Intact. Anterior: Intact. Achilles: Intrasubstance increased T2 signal is seen in the distal most fibers of the Achilles tendon. There is a small amount  fluid in the retrocalcaneal bursa. The tendon is intact. Plantar Fascia: Intact. LIGAMENTS Lateral: Intact. Medial: Intact. CARTILAGE Ankle Joint: Negative. Subtalar Joints/Sinus Tarsi: Negative. Bones: There is intense marrow edema in the posterior 4.5 cm of the calcaneus. Multiple hypointensities within the substance of the inferior calcaneus are likely due to locules of gas. Much milder degree of edema is seen in the more distal calcaneus extending to the calcaneocuboid joint. Edema is seen about the articulations of the medial and middle cuneiforms and navicular and medial cuneiform. Other: There is a large skin ulceration on the plantar surface of the heel. Air is seen within the ulceration. No focal fluid collection is seen. Imaged intrinsic musculature the foot demonstrates increased T2 signal without focal fluid collection. There is subcutaneous edema about the ankle and foot. IMPRESSION: Large heel ulceration with signal change and gas in at least the posterior 4.5 cm of the calcaneus consistent with emphysematous osteomyelitis. Milder degree in the more distal calcaneus toward the calcaneocuboid joint could be due to osteomyelitis but has an appearance most suggestive of reactive change. Intense subcutaneous edema about the ankle and visualized foot consistent with dependent change and/or cellulitis. Mild intrasubstance of the distal most Achilles tendon consistent with tendinosis, possibly infectious given the patient's heel ulceration. Associated small volume of fluid in the retrocalcaneal bursa could be septic or aseptic. Edema within all imaged  intrinsic musculature the foot could be due to denervation atrophy and/or inflammatory change. No intramuscular abscess is seen. Degenerative or neuropathic change about the midfoot. Electronically Signed   By: Inge Rise M.D.   On: 08/13/2016 15:09     Scheduled Meds: . amLODipine  10 mg Oral Daily  . atorvastatin  40 mg Oral q1800  . bethanechol  10 mg Oral TID  . carvedilol  25 mg Oral BID WC  . cloNIDine  0.3 mg Oral TID  . feeding supplement (GLUCERNA SHAKE)  237 mL Oral QID  . feeding supplement (PRO-STAT SUGAR FREE 64)  30 mL Oral BID  . FLUoxetine  10 mg Oral Daily  . insulin aspart  0-15 Units Subcutaneous TID WC  . insulin aspart  0-5 Units Subcutaneous QHS  . insulin aspart  3 Units Subcutaneous TID WC  . insulin glargine  60 Units Subcutaneous QHS  . isosorbide-hydrALAZINE  2 tablet Oral TID  . mirtazapine  7.5 mg Oral QHS  . polyethylene glycol  17 g Oral Daily  . QUEtiapine  50 mg Oral Daily  . senna-docusate  1 tablet Oral Daily  . tamsulosin  0.4 mg Oral QPC supper  . zinc sulfate  220 mg Oral Daily   Continuous Infusions: . cefTRIAXone (ROCEPHIN)  IV Stopped (08/14/16 0117)  . heparin 1,650 Units/hr (08/14/16 0704)  . metronidazole 500 mg (08/14/16 0650)  . vancomycin 1,000 mg (08/14/16 0803)     LOS: 2 days   Time Spent in minutes   30 minutes  Rexanna Louthan D.O. on 08/14/2016 at 11:25 AM  Between 7am to 7pm - Pager - [phone]  After 7pm go to [email or web address] - password [email or web address]  And look for the night coverage person covering for me after hours  [facility] Office  [phone]

## 2016-08-14 NOTE — Progress Notes (Signed)
Pharmacy Antibiotic Note  [name] is a 61 y.o. male admitted on 08/12/2016 from facility with sepsis.  Pharmacy has been consulted for vancomycin dosing. Patient also on ceftriaxone and metronidazole.  He now has polymicrobial bacteremia, ID following. Likely osteomyelitis of BL LE seen on MRI  Today, 08/14/2016 Day #2 antibiotics  Renal: SCr improved to WNL   WBC WNL  Plan:  Adjust vancomycin to 1gm IV q12h for improved renal function  Goal trough 15-20 mcg/mL for osteomyelitis  Follow renal function (check SCr at least q48h)  Check Trough if remains on vancomycin, await final sensitivities to determine course  Height: 5\' 9"  (175.3 cm) Weight: 222 lb 0.1 oz (100.7 kg) IBW/kg (Calculated) : 70.7  Temp (24hrs), Avg:101.6 F (38.7 C), Min:98.6 F (37 C), Max:103.2 F (39.6 C)   Recent Labs Lab 08/12/16 1724 08/12/16 1731 08/13/16 0434 08/14/16 0514  WBC 8.7  --  9.6 9.6  CREATININE 1.33*  --  1.10 0.97  LATICACIDVEN  --  1.30  --   --     Estimated Creatinine Clearance: 94.7 mL/min (by C-G formula based on SCr of 0.97 mg/dL).    Allergies  Allergen Reactions  . Iohexol Hives  . Ivp Dye [Iodinated Diagnostic Agents] Hives    Antimicrobials this admission: 6/17 vancomycin>> 6/17 zosynx 1 6/17 Ceftriaxone >> 6/17 metronidazole >>  Dose adjustments this admission: 6/19 vanco to 1gm q12h for improved renal fx  Microbiology results: 6/17BCx: GNR and GPC pairs (BCID - e. Coli, proteus and enterococcus) 6/17 MRSA PCR: neg  Thank you for allowing pharmacy to be a part of this patient's care.  Doreene Eland, PharmD, BCPS.   Pager: [phone] 08/14/2016 6:52 AM

## 2016-08-14 NOTE — Progress Notes (Signed)
Report called to catlin bumbledare  rn at [facility].

## 2016-08-14 NOTE — Clinical Social Work Note (Signed)
Clinical Social Work Assessment  Patient Details  Name: [name] MRN: [id number] Date of Birth: 23-Jun-1955  Date of referral:  08/14/16               Reason for consult:  Facility Placement, Discharge Planning                Permission sought to share information with:  Facility Art therapist granted to share information::  Yes, Verbal Permission Granted  Name::        Agency::     Relationship::     Contact Information:     Housing/Transportation Living arrangements for the past 2 months:  [place] of Information:  Adult Children, Facility Patient Interpreter Needed:  None Criminal Activity/Legal Involvement Pertinent to Current Situation/Hospitalization:  No - Comment as needed Significant Relationships:  Adult Children Lives with:  Facility Resident Do you feel safe going back to the place where you live?  Yes Need for family participation in patient care:  Yes (Comment)  Care giving concerns:  Insurance coverage for continued stay at [facility].   Social Worker assessment / plan:  Pt hospitalized from [place] on 08/12/16 with Osteomyelitis, right foot. Pt is alert and oriented x1. CSW spoke with pt's daughter Marlyne Beards [phone] to assist with d/c planning. Daughter reports that she would like pt to return to [facility] when stable. CSW spoke with Admissions Coordinator at [facility] and clinicals sent for review. AC from SNF reports that medicaid application for LTC has been submitted to DSS but pt still owes back payment for placement. CSW has informed daughter of this and SNF's request that pt set up payment plan with billing office. Daughter is aware that SNF is not expecting " payment in full ," but a payment does need to be made at the time payment plan is set up. Daughter is in agreement with this plan and will contact SNF billing to make these arrangements. CSW will continue to follow to assist with d/c  planning.  Employment status:  Retired Forensic scientist:  Medicare PT Recommendations:  Not assessed at this time Information / Referral to community resources:     Patient/Family's Response to care:  Daughter would like pt to return to [facility] when stable for d/c.  Patient/Family's Understanding of and Emotional Response to Diagnosis, Current Treatment, and Prognosis:  Daughter reports that she spoke with MD and is aware that pt will be transferred to [facility] and pt may require an amputation. Daughter reports that she is not surprised to hear this news. CSW provided support / reassurance.  Emotional Assessment Appearance:  Appears stated age Attitude/Demeanor/Rapport:  Other (cooperative) Affect (typically observed):  Calm, Appropriate Orientation:  Oriented to Self Alcohol / Substance use:  Not Applicable Psych involvement (Current and /or in the community):  No (Comment)  Discharge Needs  Concerns to be addressed:  Discharge Planning Concerns, Financial / Insurance Concerns Readmission within the last 30 days:  No Current discharge risk:  Inadequate Financial Supports Barriers to Discharge:  Inadequate or no insurance   Loraine Maple  [phone] 08/14/2016, 1:01 PM

## 2016-08-14 NOTE — Progress Notes (Signed)
ANTICOAGULATION CONSULT NOTE - Follow-up (Brief Note) Pharmacy Consult for heparin Indication: Severe peripheral arterial disease and history of DVT  See note from earlier today for full details:  APTT follow-up: aPTT = 95 sec following rate increase this morning to heparin 1650 units/hr  Goal of Therapy:  APTT 66-102 sec Heparin level 0.3-0.7 units/ml Monitor platelets by anticoagulation protocol: Yes  Plan:  Continue heparin at current rate of 1650 units/hr as aPTT is within goal  Check 6h level to confirm aPTT therapeutic  Expect the heparin level will correlate with aPTT 6/20 am as interference of rivaroxaban with anti-Xa level should be minimal to none.   Doreene Eland, PharmD, BCPS.   Pager: [phone] 08/14/2016 1:56 PM

## 2016-08-14 NOTE — Progress Notes (Signed)
ANTICOAGULATION CONSULT NOTE - Follow-up  Pharmacy Consult for heparin Indication: Severe peripheral arterial disease and history of DVT  Allergies  Allergen Reactions  . Iohexol Hives  . Ivp Dye [Iodinated Diagnostic Agents] Hives    Patient Measurements: Height: 5\' 9"  (175.3 cm) Weight: 222 lb 0.1 oz (100.7 kg) IBW/kg (Calculated) : 70.7 Heparin Dosing Weight: 91 Kg  Vital Signs: Temp: 101.4 F (38.6 C) (06/19 0523) Temp Source: Oral (06/19 0523) BP: 161/71 (06/19 0523) Pulse Rate: 90 (06/19 0523)  Labs:  Recent Labs  08/12/16 1724 08/13/16 0434 08/13/16 1203 08/14/16 0514  HGB 9.8* 9.3*  --  9.8*  HCT 30.5* 29.5*  --  29.4*  PLT 263 240  --  282  APTT  --   --  45* 55*  LABPROT  --   --  20.3*  --   INR  --   --  1.71  --   HEPARINUNFRC  --   --  2.00* 0.53  CREATININE 1.33* 1.10  --  0.97    Estimated Creatinine Clearance: 94.7 mL/min (by C-G formula based on SCr of 0.97 mg/dL).   Assessment: 39 YOM presents with fever from SNF.  Patient has history of DVT on rivaroxaban prior to admission. Lower extremity U/S ABI reveals severe occlusive disease in RLE and critical disease in LLE.  Pharmacy asked to start heparin.  Last dose on rivaroxaban was 6/17 at ~2200.  Baseline INR = 1.71  APTT = 45 sec Heparin level = 2  Today, 08/14/2016   Heparin level therapeutic but expect rivaroxaban still having effect on lab  APTT = 55 sec (SUBtherapeutic)  CBC: Hgb = 9.8 (stable), pltc WNL  Renal: SCr improved to WNL  Baseline coags elevated, expected heparin level to be elevated with recent xarelto  Goal of Therapy:  APTT 66-102 sec Heparin level 0.3-0.7 units/ml Monitor platelets by anticoagulation protocol: Yes   Plan:   Increase heparin gtt rate to 1650 units/hr.   Check aPTT in 6h  Expect will be able to dose heparin off anti-Xa level (ie Heparin level 6/20 am)  Daily heparin level, CBC, aPTT   Dose heparin gtt per aPTT until heparin level  correlates  Doreene Eland, PharmD, BCPS.   Pager: [phone] 08/14/2016 6:32 AM

## 2016-08-14 NOTE — Evaluation (Signed)
SLP Cancellation Note  Patient Details Name: [name] MRN: [id number] DOB: 1955/09/29   Cancelled treatment:       Reason Eval/Treat Not Completed: Other (comment);Patient declined, no reason specified (pt getting ready to transfer to [facility] [place] requested for blanket and SLP to follow up next date; note - SLP reviewed prior MBS from 03/2016- and recommend continue dys2/nectar diet based on those results and "choking on water" in ED )   Luanna Salk, [employer] [facility] [phone]

## 2016-08-15 DIAGNOSIS — I70244 Atherosclerosis of native arteries of left leg with ulceration of heel and midfoot: Secondary | ICD-10-CM

## 2016-08-15 DIAGNOSIS — I70234 Atherosclerosis of native arteries of right leg with ulceration of heel and midfoot: Secondary | ICD-10-CM

## 2016-08-15 LAB — C-REACTIVE PROTEIN: CRP: 12.6 mg/dL — AB (ref <1.0)

## 2016-08-15 LAB — HEPARIN LEVEL (UNFRACTIONATED)
Heparin Unfractionated: 0.39 IU/mL (ref 0.30–0.70)
Heparin Unfractionated: 0.27 IU/mL — ABNORMAL LOW (ref 0.30–0.70)

## 2016-08-15 LAB — BASIC METABOLIC PANEL
ANION GAP: 8 (ref 5–15)
BUN: 19 mg/dL (ref 6–20)
CALCIUM: 8.2 mg/dL — AB (ref 8.9–10.3)
CO2: 23 mmol/L (ref 22–32)
CREATININE: 1.13 mg/dL (ref 0.61–1.24)
Chloride: 104 mmol/L (ref 101–111)
GFR calc Af Amer: >60 mL/min (ref >60)
GFR calc non Af Amer: >60 mL/min (ref >60)
GLUCOSE: 305 mg/dL — AB (ref 65–99)
Potassium: 4.1 mmol/L (ref 3.5–5.1)
Sodium: 135 mmol/L (ref 135–145)

## 2016-08-15 LAB — CBC
HCT: 28.3 % — ABNORMAL LOW (ref 39.0–52.0)
HEMOGLOBIN: 8.6 g/dL — AB (ref 13.0–17.0)
MCH: 26.6 pg (ref 26.0–34.0)
MCHC: 30.4 g/dL (ref 30.0–36.0)
MCV: 87.6 fL (ref 78.0–100.0)
PLATELETS: 263 10*3/uL (ref 150–400)
RBC: 3.23 MIL/uL — ABNORMAL LOW (ref 4.22–5.81)
RDW: 14.2 % (ref 11.5–15.5)
WBC: 7.5 10*3/uL (ref 4.0–10.5)

## 2016-08-15 LAB — GLUCOSE, CAPILLARY
GLUCOSE-CAPILLARY: 203 mg/dL — AB (ref 65–99)
GLUCOSE-CAPILLARY: 314 mg/dL — AB (ref 65–99)
Glucose-Capillary: 283 mg/dL — ABNORMAL HIGH (ref 65–99)
Glucose-Capillary: 227 mg/dL — ABNORMAL HIGH (ref 65–99)

## 2016-08-15 LAB — APTT: aPTT: 113 seconds — ABNORMAL HIGH (ref 24–36)

## 2016-08-15 LAB — SEDIMENTATION RATE: SED RATE: 135 mm/h — AB (ref 0–16)

## 2016-08-15 MED ORDER — DEXTROSE 5 % IV SOLN
1.5000 g | INTRAVENOUS | Status: AC
  Administered 2016-08-16: 1.5 g via INTRAVENOUS
  Filled 2016-08-15 (×2): qty 1.5

## 2016-08-15 MED ORDER — INSULIN ASPART 100 UNIT/ML ~~LOC~~ SOLN
5.0000 [IU] | Freq: Three times a day (TID) | SUBCUTANEOUS | Status: DC
  Administered 2016-08-15: 5 [IU] via SUBCUTANEOUS

## 2016-08-15 NOTE — Progress Notes (Addendum)
Daily Progress Note   Patient Name: [name]       Date: 08/15/2016 DOB: [birth year]  Age: 61 y.o. MRN#: [id number] Attending Physician: Serafina Mitchell, MD Primary Care Physician: Maury Dus, MD Admit Date: 08/12/2016  Reason for Consultation/Follow-up: Establishing goals of care and Psychosocial/spiritual support  Subjective: [name] was more interactive today when I visited. He was oriented to person and place, with an improved verbalization of the situation. He remains confused on time. He relates right foot discomfort, but feels like the pain is "easing off." He is otherwise without complaints. No family at the bedside.   Length of Stay: 3  Current Medications: Scheduled Meds:  . amLODipine  10 mg Oral Daily  . atorvastatin  40 mg Oral q1800  . bethanechol  10 mg Oral TID  . carvedilol  25 mg Oral BID WC  . cloNIDine  0.3 mg Oral TID  . feeding supplement (GLUCERNA SHAKE)  237 mL Oral QID  . feeding supplement (PRO-STAT SUGAR FREE 64)  30 mL Oral BID  . FLUoxetine  10 mg Oral Daily  . insulin aspart  0-15 Units Subcutaneous TID WC  . insulin aspart  0-5 Units Subcutaneous QHS  . insulin aspart  3 Units Subcutaneous TID WC  . insulin glargine  60 Units Subcutaneous QHS  . isosorbide-hydrALAZINE  2 tablet Oral TID  . mirtazapine  7.5 mg Oral QHS  . polyethylene glycol  17 g Oral Daily  . QUEtiapine  50 mg Oral Daily  . senna-docusate  1 tablet Oral Daily  . tamsulosin  0.4 mg Oral QPC supper  . zinc sulfate  220 mg Oral Daily    Continuous Infusions: . cefTRIAXone (ROCEPHIN)  IV Stopped (08/15/16 0139)  . heparin 1,800 Units/hr (08/15/16 0351)  . metronidazole 500 mg (08/15/16 0845)  . vancomycin Stopped (08/14/16 2326)    PRN Meds: acetaminophen **OR** acetaminophen, albuterol, bisacodyl,  HYDROcodone-acetaminophen, hydrocortisone, lidocaine, ondansetron **OR** ondansetron (ZOFRAN) IV, sodium phosphate  Physical Exam      Constitutional: He appears well-developed and well-nourished. No distress.  HENT:  Head: Normocephalic and atraumatic.  Mouth/Throat: Oropharynx is clear and moist. Abnormal dentition. No oropharyngeal exudate.  Eyes: EOM are normal.  Vision impaired, unclear on extent  Neck: Normal range of motion. Neck supple.  Cardiovascular: Normal rate and regular rhythm.   Pulmonary/Chest: Effort normal and breath sounds normal. No respiratory distress.  Abdominal: Soft. Bowel sounds are normal.  Musculoskeletal:  Weakness on right side, but able to move all extremities.   Neurological:  Alert. Oriented to person, place, and situation. Confused on time.  Follows simple commands with minimal cues required. Skin: Skin is warm and dry.  Wound dressings on bilateral feet, could not visualize wound. Feet with significant odor. Dressings CDI.   Psychiatric: His speech is delayed. He is slowed. Cognition and memory are impaired.     Withdrawn and refuses to engage when talking about health issues, but otherwise pleasantly socially interactive.  Vital Signs: BP (!) 159/75 (BP Location: Right Arm)   Pulse 80   Temp 98.7 F (37.1 C) (Oral)   Resp 18   Ht 5\' 9"  (1.753 m)   Wt 101.3 kg (  223 lb 4.8 oz)   SpO2 97%   BMI 32.98 kg/m  SpO2: SpO2: 97 % O2 Device: O2 Device: Not Delivered O2 Flow Rate:    Intake/output summary:  Intake/Output Summary (Last 24 hours) at 08/15/16 3419 Last data filed at 08/15/16 0600  Gross per 24 hour  Intake           1450.9 ml  Output             1000 ml  Net            450.9 ml   LBM: Last BM Date: 08/13/16 Baseline Weight: Weight: 104.3 kg (230 lb) Most recent weight: Weight: 101.3 kg (223 lb 4.8 oz)       Palliative Assessment/Data: PPS 40-50%   Flowsheet Rows     Most Recent Value  Intake Tab  Referral Department   Hospitalist  Unit at Time of Referral  Med/Surg Unit  Palliative Care Primary Diagnosis  Sepsis/Infectious Disease  Date Notified  08/14/16  Palliative Care Type  New Palliative care  Reason for referral  Clarify Goals of Care  Date of Admission  08/12/16  Date first seen by Palliative Care  08/14/16  # of days Palliative referral response time  0 Day(s)  # of days IP prior to Palliative referral  2  Clinical Assessment  Psychosocial & Spiritual Assessment  Palliative Care Outcomes      Patient Active Problem List   Diagnosis Date Noted  . Goals of care, counseling/discussion   . Palliative care by specialist   . Enterococcal bacteremia 08/13/2016  . Bacteremia due to Escherichia coli 08/13/2016  . Proteus infection 08/13/2016  . Osteomyelitis of left foot ([place]) 08/13/2016  . History of CVA (cerebrovascular accident) 08/12/2016  . Osteomyelitis of right foot ([place]) 08/12/2016  . HLD (hyperlipidemia) 05/15/2016  . AKI (acute kidney injury) ([place]) 05/10/2016  . Pressure injury of skin 05/10/2016  . Dysphagia due to recent stroke 04/28/2016  . Hyperlipidemia associated with type 2 diabetes mellitus ([place]) 04/28/2016  . Depression 04/28/2016  . Cognitive and behavioral changes   . Cerebrovascular accident (CVA) due to embolism of left posterior cerebral artery ([place])   . Renovascular hypertension   . History of DVT (deep vein thrombosis)   . Coronary artery disease involving native coronary artery of native heart without angina pectoris   . Morbid obesity ([place])   . Normocytic anemia   . Embolic stroke involving posterior cerebral artery ([place]) 04/03/2016  . Brainstem stroke ([place]) 04/01/2016  . Abnormality of gait and mobility   . Cerebral infarction due to thrombosis of other cerebral artery ([place])   . Right arm numbness   . Left sided numbness 03/29/2016  . TIA (transient ischemic attack) 03/29/2016  . Hypertensive urgency 03/29/2016  . Sepsis ([place]) 09/10/2013  . Personal  history of DVT (deep vein thrombosis) 01/01/2013  . Diabetes type 2, uncontrolled ([place]) 12/29/2012  . Hypertension   . Cardiomyopathy ([place])   . Chronic diastolic CHF (congestive heart failure) ([place])   . Peripheral neuropathy   . Special screening for malignant neoplasms, colon 12/09/2012    Palliative Care Assessment & Plan   HPI: 61 y.o. male  with past medical history of DM2, HT N, dCHF, CAD s/p stenting, PAD, prior DVT on anticoagulation, depression, and prior CVA with dysarthria. He has been followed in the outpatient setting for PAD with non-healing wounds on both feet. He then presented to the ED from SNF with fever. He was admitted  on 08/12/2016. Work-up revealed sepsis secondary to osteomyelitis of feet. Vascular and ID following. Palliative consulted to assist in goals of care clarification.  Assessment: [name] was more interactive today. He was oriented to person and place. In talking about his current health issues, specifically his PAD/osteomyelitis/infection, he was able to tell me that his feet were infected and "rotting" and that doctors were talking about needing to cut both of his legs off above the knee. When asked how he felt about that, he expressed "God gave me my legs, and I want to keep them." I reinforced that he could decide how to proceed, and that amputation was one option. The other option would be to not amputate. Without amputation, he would have recurrent infections and the goal would be keeping him comfortable as these infections would be shortening his life. In this comfort route, I explained that Hospice could be utilized. At this point he withdrew from the conversation, shut his eyes, and declined to talk any further. I asked if he wanted me to contact his daughter, [name], and he nodded his head. As I left the room he resumed eating his breakfast.  I was then able to meet his family at the bedside to follow-upon our conversation. Steffanie [place], and Nicole's  partner were present. I explained the options again, focusing on vascular's assessment: "anticipate that he will requite amputation vs comfort care." I also shared what [name] had said to me when I asked his opinion. [name] was the voice for her family. She shared that they were still trying to determine what their father wants. They had initially discussed planning to exercise all options, then proceeding with amputation as a last resort. Today, however, [name] was less sure on that option and was persistently withdrawn, which limited the conversation. As requested, I did talk through the risks of surgery and post-surgical recovery. I also emphasized the importance of considering quality of life, and focusing on how [name] wants to spend his time Serafina Johnson he has left].   Presently, [name] is still processing the options. [name] would like to speak with Dr. Trula Slade (Vascular) directly. She will continue to discuss the options with her father and asked that I follow-up with them again tomorrow.   Finally, I did discuss code status. [name] was very clear that her father should remain a Full Code. We talked through what that would entail, as well as the low likelihood of meaningful recovery. She acknowledges these things and relates that Full Code is desired to enable time for other family to arrive (specifically [name] sisters).   Recommendations/Plan:  Full code  Pt and family processing options; [name] would like to speak directly with vascular  ADDENDUM: I received a call from [place] at 1230. After further discussion, they have decided to pursue interventions intended to prolong his life, including amputation if deemed necessary. They understand the risks, but feel amputation provides the best chance at enabling him to live longer (and feel like they can work to improve his quality of life even with amputation).   Code Status:  Full code  Prognosis:   Unable to  determine  Discharge Planning:  To Be Determined  Care plan was discussed with pt, Nicole/HCPOA, Brandi  Thank you for allowing the Palliative Medicine Team to assist in the care of this patient.  Total time: 35 minutes    Greater than 50%  of this time was spent counseling and coordinating care related to the above  assessment and plan.  Charlynn Court, NP Palliative Medicine Team [phone] pager (7a-5p) Team Phone # [phone]

## 2016-08-15 NOTE — Progress Notes (Signed)
ANTICOAGULATION CONSULT NOTE - Follow Up Consult  Pharmacy Consult for Heparin (Xarelto on hold) Indication: Hx DVT, severe PAD  Allergies  Allergen Reactions  . Iohexol Hives  . Ivp Dye [Iodinated Diagnostic Agents] Hives    Patient Measurements: Height: 5\' 9"  (175.3 cm) Weight: 223 lb 4.8 oz (101.3 kg) IBW/kg (Calculated) : 70.7  Vital Signs: Temp: 98.6 F (37 C) (06/20 1218) Temp Source: Oral (06/20 1218) BP: 146/72 (06/20 1218) Pulse Rate: 80 (06/20 1218)  Labs:  Recent Labs  08/13/16 0434  08/13/16 1203 08/14/16 0514 08/14/16 1319 08/14/16 2243 08/15/16 0526 08/15/16 1116  HGB 9.3*  --   --  9.8*  --   --  8.6*  --   HCT 29.5*  --   --  29.4*  --   --  28.3*  --   PLT 240  --   --  282  --   --  263  --   APTT  --   < > 45* 55* 92* 42*  --  113*  LABPROT  --   --  20.3*  --   --   --   --   --   INR  --   --  1.71  --   --   --   --   --   HEPARINUNFRC  --   --  2.00* 0.53  --   --   --  0.39  CREATININE 1.10  --   --  0.97  --   --  1.13  --   < > = values in this interval not displayed.  Estimated Creatinine Clearance: 81.5 mL/min (by C-G formula based on SCr of 1.13 mg/dL).  Assessment: 37 yom continuing on heparin while Xarelto on hold while vascular determines definitive plan. Heparin level now therapeutic on rate increase. No need to follow aPTTs further. Hg down 8.6, plt wnl. No bleed documented.  Goal of Therapy:  Heparin level 0.3-0.7 units/ml Monitor platelets by anticoagulation protocol: Yes   Plan:  Continue heparin at 1800 units/hr 6h heparin level to confirm Daily CBC/heparin level Monitor for s/sx bleeding  Elicia Lamp, PharmD, BCPS Clinical Pharmacist Rx Phone # for today: [phone] After 3:30PM, please call Main Rx: [phone] 08/15/2016 1:08 PM

## 2016-08-15 NOTE — Progress Notes (Signed)
Subjective  -   [facility] C/o pain in his feet   Physical Exam:  Significant deterioration in both heel wounds from 3 months ago Lateral right leg ulcer , 5cm Eschar to both heels, full thickness.  Left is owrse than right Both dressings changed today       Assessment/Plan:    Bilateral LE ulcers are significantly worse than when I last saw him.  I was considering tibial bypass on the left, but I am unsure if he has a chance to salvage his leg currently.  I will need to discuss this with his daughters and Palliative care to determine the most appropriate course of action.  I anticipate that he will require amputation vs comfort care  [name] 08/15/2016 8:43 AM --  Vitals:   08/14/16 1954 08/15/16 0332  BP: (!) 148/65 (!) 159/75  Pulse: 80 80  Resp: 18 18  Temp: 98.6 F (37 C) 98.7 F (37.1 C)    Intake/Output Summary (Last 24 hours) at 08/15/16 0843 Last data filed at 08/15/16 0600  Gross per 24 hour  Intake           1450.9 ml  Output             1000 ml  Net            450.9 ml     Laboratory CBC    Component Value Date/Time   WBC 7.5 08/15/2016 0526   HGB 8.6 (L) 08/15/2016 0526   HCT 28.3 (L) 08/15/2016 0526   PLT 263 08/15/2016 0526    BMET    Component Value Date/Time   NA 135 08/15/2016 0526   NA 140 06/19/2016   K 4.1 08/15/2016 0526   CL 104 08/15/2016 0526   CO2 23 08/15/2016 0526   GLUCOSE 305 (H) 08/15/2016 0526   BUN 19 08/15/2016 0526   BUN 12 06/19/2016   CREATININE 1.13 08/15/2016 0526   CREATININE 1.08 06/05/2016 1227   CALCIUM 8.2 (L) 08/15/2016 0526   GFRNONAA >60 08/15/2016 0526   GFRAA >60 08/15/2016 0526    COAG Lab Results  Component Value Date   INR 1.71 08/13/2016   INR 1.21 05/10/2016   INR 0.92 03/28/2016   No results found for: PTT  Antibiotics Anti-infectives    Start     Dose/Rate Route Frequency Ordered Stop   08/14/16 0800  vancomycin (VANCOCIN) IVPB 1000 mg/200 mL  premix     1,000 mg 200 mL/hr over 60 Minutes Intravenous Every 12 hours 08/14/16 0653     08/13/16 1800  vancomycin (VANCOCIN) 1,250 mg in sodium chloride 0.9 % 250 mL IVPB  Status:  Discontinued     1,250 mg 166.7 mL/hr over 90 Minutes Intravenous Every 24 hours 08/12/16 1845 08/14/16 0653   08/13/16 0000  piperacillin-tazobactam (ZOSYN) IVPB 3.375 g  Status:  Discontinued     3.375 g 12.5 mL/hr over 240 Minutes Intravenous Every 8 hours 08/12/16 1845 08/12/16 1948   08/13/16 0000  cefTRIAXone (ROCEPHIN) 2 g in dextrose 5 % 50 mL IVPB     2 g 100 mL/hr over 30 Minutes Intravenous Every 24 hours 08/12/16 1948     08/12/16 2200  metroNIDAZOLE (FLAGYL) IVPB 500 mg     500 mg 100 mL/hr over 60 Minutes Intravenous Every 8 hours 08/12/16 1948     08/12/16 1815  vancomycin (VANCOCIN) 2,000 mg in sodium chloride 0.9 % 500 mL IVPB  2,000 mg 250 mL/hr over 120 Minutes Intravenous  Once 08/12/16 1804 08/12/16 2128   08/12/16 1800  piperacillin-tazobactam (ZOSYN) IVPB 3.375 g     3.375 g 100 mL/hr over 30 Minutes Intravenous  Once 08/12/16 1748 08/12/16 1924   08/12/16 1800  vancomycin (VANCOCIN) IVPB 1000 mg/200 mL premix  Status:  Discontinued     1,000 mg 200 mL/hr over 60 Minutes Intravenous  Once 08/12/16 1748 08/12/16 1804       V. Leia Alf, M.D. [facility] Office: [phone] Pager:  [phone]

## 2016-08-15 NOTE — Progress Notes (Signed)
ANTICOAGULATION CONSULT NOTE - Follow Up Consult  Pharmacy Consult for Heparin (Xarelto on hold) Indication: Hx DVT, severe PAD  Allergies  Allergen Reactions  . Iohexol Hives  . Ivp Dye [Iodinated Diagnostic Agents] Hives    Patient Measurements: Height: 5\' 9"  (175.3 cm) Weight: 222 lb 0.1 oz (100.7 kg) IBW/kg (Calculated) : 70.7  Vital Signs: Temp: 98.6 F (37 C) (06/19 1954) Temp Source: Oral (06/19 1954) BP: 148/65 (06/19 1954) Pulse Rate: 80 (06/19 1954)  Labs:  Recent Labs  08/12/16 1724 08/13/16 0434  08/13/16 1203 08/14/16 0514 08/14/16 1319 08/14/16 2243  HGB 9.8* 9.3*  --   --  9.8*  --   --   HCT 30.5* 29.5*  --   --  29.4*  --   --   PLT 263 240  --   --  282  --   --   APTT  --   --   < > 45* 55* 92* 42*  LABPROT  --   --   --  20.3*  --   --   --   INR  --   --   --  1.71  --   --   --   HEPARINUNFRC  --   --   --  2.00* 0.53  --   --   CREATININE 1.33* 1.10  --   --  0.97  --   --   < > = values in this interval not displayed.  Estimated Creatinine Clearance: 94.7 mL/min (by C-G formula based on SCr of 0.97 mg/dL).  Assessment: Heparin while Xarelto on hold while vascular determines definitive plan, aPTT tonight is low at 42, no infusion issues per RN, using aPTT to dose given Xarelto influence on anti-Xa levels.   Goal of Therapy:  Heparin level 0.3-0.7 units/ml aPTT 66-102 seconds Monitor platelets by anticoagulation protocol: Yes   Plan:  -Inc heparin to 1800 units/hr -0800 HL/aPTT  Narda Bonds 08/15/2016,12:01 AM

## 2016-08-15 NOTE — Evaluation (Signed)
Clinical/Bedside Swallow Evaluation Patient Details  Name: [name] MRN: [id number] Date of Birth: [birth year]  Today's Date: 08/15/2016 Time: SLP Start Time (ACUTE ONLY): 0830 SLP Stop Time (ACUTE ONLY): 0850 SLP Time Calculation (min) (ACUTE ONLY): 20 min  Past Medical History:  Past Medical History:  Diagnosis Date  . Adrenal insufficiency ([place])   . Allergy   . Arthritis   . Blind left eye   . Carpal tunnel syndrome   . Cataract    bil removed  . CHF (congestive heart failure) ([place])   . Diabetes mellitus without complication ([place])   . ED (erectile dysfunction)   . Gout   . Herpes   . Hypercholesterolemia   . Hypertension   . MI (myocardial infarction) ([place])   . Nausea & vomiting   . Peripheral neuropathy   . Pneumonia   . PVD (peripheral vascular disease) ([place])    Past Surgical History:  Past Surgical History:  Procedure Laterality Date  . ABDOMINAL AORTOGRAM W/LOWER EXTREMITY N/A 06/19/2016   Procedure: Abdominal Aortogram w/Lower Extremity;  Surgeon: Serafina Mitchell, MD;  Location: [facility];  Service: Cardiovascular;  Laterality: N/A;  . CHOLECYSTECTOMY    . CORONARY ANGIOPLASTY WITH STENT PLACEMENT  2007   two 2.5  x 13 mm Cypher stents to the RCA  . dilatera cataracts removed    . right knee surgary     HPI:  [name] is a 61 y.o. male with medical history significant for insulin-dependent diabetes mellitus, hypertension, chronic diastolic CHF, history of DVT, CAD with stents, peripheral arterial disease, and history of CVA with residual dysarthria, dysphagia, and weakness, now presenting from his SNF for evaluation of fevers. Patient had reportedly been in his usual state of health until he was noted to have a temperature of 103.7 per MD note. There has not been any recent cough, vomiting, or diarrhea per MD note. He has been receiving wound care for bilateral foot ulcers. He has been following with vascular surgery in the outpatient setting for  peripheral arterial disease and nonhealing foot wounds bilaterally.   CXR showed bilateral ATX/vascular congestion.  Swallow eval ordered. Pt transferred to [facility] for further management of foot ulcers.    Assessment / Plan / Recommendation Clinical Impression  Pt continues at baseline oropharyngeal function consistenting of cough with thin liquids via cup. Pt consumed dysphagia 2 breakfast tray with nectar thick liquids via cup without overt s/s of aspiration. Pt intermittently noted to take large bites of dysphagia 2, therefore no further diet upgrade is recommended and intermittent nursing supervision requested d/t pt's moderate cognitive deficits. Medicine can be attempted whole or crushed, depending on pt's toleration. All education completed.  SLP Visit Diagnosis: Dysphagia, oropharyngeal phase (R13.12)    Aspiration Risk  Mild aspiration risk    Diet Recommendation Dysphagia 2 (Fine chop);Nectar-thick liquid   Liquid Administration via: Cup Medication Administration: Whole meds with liquid Supervision: Staff to assist with self feeding;Intermittent supervision to cue for compensatory strategies Compensations: Minimize environmental distractions;Slow rate;Small sips/bites Postural Changes: Seated upright at 90 degrees    Other  Recommendations Oral Care Recommendations: Oral care BID Other Recommendations: Order thickener from pharmacy;Prohibited food (jello, ice cream, thin soups);Remove water pitcher;Have oral suction available     Swallow Study   General Date of Onset: 08/13/16 HPI: [name] is a 61 y.o. male with medical history significant for insulin-dependent diabetes mellitus, hypertension, chronic diastolic CHF, history of DVT, CAD with stents, peripheral arterial  disease, and history of CVA with residual dysarthria, dysphagia, and weakness, now presenting from his SNF for evaluation of fevers. Patient had reportedly been in his usual state of health until he was noted to  have a temperature of 103.7 per MD note. There has not been any recent cough, vomiting, or diarrhea per MD note. He has been receiving wound care for bilateral foot ulcers. He has been following with vascular surgery in the outpatient setting for peripheral arterial disease and nonhealing foot wounds bilaterally.   CXR showed bilateral ATX/vascular congestion.  Swallow eval ordered. Pt transferred to [facility] for further management of foot ulcers.  Type of Study: Bedside Swallow Evaluation Previous Swallow Assessment: MBS 03/2016 delayed swallow, rec dys2/nectar due to overt coughing with thin clinically Diet Prior to this Study: Dysphagia 2 (chopped);Nectar-thick liquids Temperature Spikes Noted: Yes Respiratory Status: Room air History of Recent Intubation: No Behavior/Cognition: Alert;Cooperative;Pleasant mood Vision: Functional for self-feeding Self-Feeding Abilities: Needs assist;Needs set up Patient Positioning: Upright in bed Baseline Vocal Quality: Normal Volitional Cough: Strong Volitional Swallow: Able to elicit    Oral/Motor/Sensory Function Overall Oral Motor/Sensory Function: Generalized oral weakness (at baseline)   Ice Chips Ice chips: Not tested   Thin Liquid Thin Liquid: Impaired Presentation: Cup Pharyngeal  Phase Impairments: Cough - Immediate Other Comments: Thin water presetnat bedside, pt consumed via cup with immediate cough    Nectar Thick Nectar Thick Liquid: Within functional limits Presentation: Cup;Self Fed   Honey Thick Honey Thick Liquid: Not tested   Puree Puree: Within functional limits   Solid   GO   Solid: Within functional limits Other Comments:  (dysphagia 2 diet items present only)        [name] B. Rutherford Nail M.S., [place] Speech-Language Pathologist  [name] 08/15/2016,9:17 AM

## 2016-08-15 NOTE — Progress Notes (Signed)
PROGRESS NOTE    [name]  JQZ:[id number] DOB: 25-Mar-1955 DOA: 08/12/2016 PCP: Maury Dus, MD    Brief Narrative:  61 year old with h/o DM, hypertension, chronic diastolic CHF, DVT, CAD, PVD, CVA with residual dysarthria coming in from SNF for persistent fevers. He was found to have bilateral heel ulcers , which have worsened over the last few weeks. This admission, ABI obtained showed severe / critical disease, vascular surgery consulted and transferred to [facility] for further work up/ intervention.    Assessment & Plan:   Active Problems:   Diabetes type 2, uncontrolled (HCC)   Hypertension   Chronic diastolic CHF (congestive heart failure) (HCC)   Peripheral neuropathy   Personal history of DVT (deep vein thrombosis)   History of DVT (deep vein thrombosis)   Coronary artery disease involving native coronary artery of native heart without angina pectoris   Cognitive and behavioral changes   Dysphagia due to recent stroke   AKI (acute kidney injury) ([place])   Pressure injury of skin   History of CVA (cerebrovascular accident)   Osteomyelitis of right foot ([place])   Enterococcal bacteremia   Bacteremia due to Escherichia coli   Proteus infection   Osteomyelitis of left foot (HCC)   Goals of care, counseling/discussion   Palliative care by specialist   Sepsis from osteomyelitis from bilateral heel ulcers / Gram negative bacteremia:   Currently on IV antibiotics, with IV vancomycin, rocephin and flagyl.  ID consulted and recommended repeating blood cultures.  Repeat blood cultures have been negative so far.  Discussed with vascular surgery, recommending. AKA later this week.  Meanwhile palliative care consulted for [place].    PAD:  See above. Resume statin.   CAD: no chest pain ors ob.  Resume coreg, statin.    H/o DVT: ON heparin , pending surgery.    AKI:  Probably from sepsis and pre renal in etiology.  Normalized after fluids.    H/o Chronic diastolic heart  failure:  He appears euvolemic.  Lasix held on admission for sepsis and AKI.  Resume on discharge.    Dysphagia:  Possibly from prior CVA.  SLP eval recommended Dysphagia 2 diet.   Hypertension:  Controlled.   anemia of chronic disease/ Normocytic anemia:  Hemoglobin around 8.6.  Transfuse if hemoglobin drops to less than 8.     Diabetes Mellitus:  Type 2. A1c 11.  CBG (last 3)   Recent Labs  08/14/16 2103 08/15/16 0600 08/15/16 1123  GLUCAP 199* 283* 227*    cbgs elevated. Increased pre meal to novolog 5 units TIDAC.  Currently on 60 units of lantus daily.     H/o CVA:  With residual weakness.    DVT prophylaxis: heparin gtt.  Code Status: (Full) Family Communication: none at bedside.  Disposition Plan: pending eval and treatment by vascular, followed by PT eval.  Consultants:   Vascular surgery Dr Donzetta Matters.   Infectious disease Dr Megan Salon  Wound care  Palliative care consult.    Procedures: ABI   Antimicrobials: rocephin from 6/18, flagyl from 6/17, IV vancomycin from 6/17   Subjective: ROS No chest pain , sob, or nausea, or vomiting,  No abdominal pain.  No headache or dizzines.  Reports being worried about his feet.    Objective: Vitals:   08/14/16 1400 08/14/16 1720 08/14/16 1954 08/15/16 0332  BP: (!) 149/60 (!) 145/63 (!) 148/65 (!) 159/75  Pulse: 85 86 80 80  Resp: 18 18 18 18   Temp: 99.3 F (  37.4 C) 100 F (37.8 C) 98.6 F (37 C) 98.7 F (37.1 C)  TempSrc: Oral Oral Oral Oral  SpO2: 98% 98% 96% 97%  Weight:    101.3 kg (223 lb 4.8 oz)  Height:        Intake/Output Summary (Last 24 hours) at 08/15/16 1156 Last data filed at 08/15/16 0830  Gross per 24 hour  Intake              966 ml  Output              200 ml  Net              766 ml   Filed Weights   08/13/16 0712 08/14/16 0500 08/15/16 0332  Weight: 98.8 kg (217 lb 13 oz) 100.7 kg (222 lb 0.1 oz) 101.3 kg (223 lb 4.8 oz)    Examination:  General exam: Appears  calm  But worried about his leg.  Respiratory system: Clear to auscultation. Respiratory effort normal. Cardiovascular system: S1 & S2 heard, RRR. No JVD, murmurs, rubs, gallops or clicks Gastrointestinal system: Abdomen is nondistended, soft and nontender. No organomegaly or masses felt. Normal bowel sounds heard. Central nervous system: Alert and appears oriented. Able to move alle xtremities.  Extremities: feet in boots, trace le edema.  Skin: No rashes, lesions or ulcers Psychiatry: flat affect,     Data Reviewed: I have personally reviewed following labs and imaging studies  CBC:  Recent Labs Lab 08/12/16 1724 08/13/16 0434 08/14/16 0514 08/15/16 0526  WBC 8.7 9.6 9.6 7.5  NEUTROABS 6.9 6.6  --   --   HGB 9.8* 9.3* 9.8* 8.6*  HCT 30.5* 29.5* 29.4* 28.3*  MCV 84.5 83.6 83.8 87.6  PLT 263 240 282 505   Basic Metabolic Panel:  Recent Labs Lab 08/12/16 1724 08/13/16 0434 08/14/16 0514 08/15/16 0526  NA 135 137 138 135  K 3.6 3.5 3.6 4.1  CL 100* 104 105 104  CO2 26 27 25 23   GLUCOSE 313* 227* 184* 305*  BUN 22* 19 18 19   CREATININE 1.33* 1.10 0.97 1.13  CALCIUM 8.4* 8.1* 8.4* 8.2*   GFR: Estimated Creatinine Clearance: 81.5 mL/min (by C-G formula based on SCr of 1.13 mg/dL). Liver Function Tests:  Recent Labs Lab 08/12/16 1724  AST 16  ALT 17  ALKPHOS 75  BILITOT 0.3  PROT 7.5  ALBUMIN 2.5*   No results for input(s): LIPASE, AMYLASE in the last 168 hours. No results for input(s): AMMONIA in the last 168 hours. Coagulation Profile:  Recent Labs Lab 08/13/16 1203  INR 1.71   Cardiac Enzymes: No results for input(s): CKTOTAL, CKMB, CKMBINDEX, TROPONINI in the last 168 hours. BNP (last 3 results) No results for input(s): PROBNP in the last 8760 hours. HbA1C: No results for input(s): HGBA1C in the last 72 hours. CBG:  Recent Labs Lab 08/14/16 0950 08/14/16 1826 08/14/16 2103 08/15/16 0600 08/15/16 1123  GLUCAP 142* 151* 199* 283* 227*    Lipid Profile: No results for input(s): CHOL, HDL, LDLCALC, TRIG, CHOLHDL, LDLDIRECT in the last 72 hours. Thyroid Function Tests: No results for input(s): TSH, T4TOTAL, FREET4, T3FREE, THYROIDAB in the last 72 hours. Anemia Panel:  Recent Labs  08/13/16 1504  VITAMINB12 416  FOLATE 20.4  FERRITIN 367*  TIBC 141*  IRON 10*  RETICCTPCT 0.6   Sepsis Labs:  Recent Labs Lab 08/12/16 1731  LATICACIDVEN 1.30    Recent Results (from the past 240 hour(s))  Blood culture (routine  x 2)     Status: None (Preliminary result)   Collection Time: 08/12/16  5:29 PM  Result Value Ref Range Status   Specimen Description BLOOD LEFT ARM  Final   Special Requests BOTTLES DRAWN AEROBIC AND ANAEROBIC BCAV  Final   Culture  Setup Time   Final    GRAM NEGATIVE RODS GRAM POSITIVE COCCI IN PAIRS IN BOTH AEROBIC AND ANAEROBIC BOTTLES CRITICAL RESULT CALLED TO, READ BACK BY AND VERIFIED WITH: T GREEN,PHARMD AT 1151 08/13/16 BY L BENFIELD    Culture   Final    GRAM NEGATIVE RODS GRAM POSITIVE COCCI CULTURE REINCUBATED FOR BETTER GROWTH Performed at [facility], [place] [address], [place], [place] [address]    Report Status PENDING  Incomplete  Blood Culture ID Panel (Reflexed)     Status: Abnormal   Collection Time: 08/12/16  5:29 PM  Result Value Ref Range Status   Enterococcus species DETECTED (A) NOT DETECTED Final    Comment: CRITICAL RESULT CALLED TO, READ BACK BY AND VERIFIED WITH: T GREEN,PHARMD AT 1151 08/13/16 BY L BENFIELD    Vancomycin resistance NOT DETECTED NOT DETECTED Final   Listeria monocytogenes NOT DETECTED NOT DETECTED Final   Staphylococcus species NOT DETECTED NOT DETECTED Final   Staphylococcus aureus NOT DETECTED NOT DETECTED Final   Streptococcus species NOT DETECTED NOT DETECTED Final   Streptococcus agalactiae NOT DETECTED NOT DETECTED Final   Streptococcus pneumoniae NOT DETECTED NOT DETECTED Final   Streptococcus pyogenes NOT DETECTED NOT DETECTED Final    Acinetobacter baumannii NOT DETECTED NOT DETECTED Final   Enterobacteriaceae species DETECTED (A) NOT DETECTED Final    Comment: CRITICAL RESULT CALLED TO, READ BACK BY AND VERIFIED WITH: T GREEN,PHARMD AT 1151 08/13/16 BY L BENFIELD    Enterobacter cloacae complex NOT DETECTED NOT DETECTED Final   Escherichia coli DETECTED (A) NOT DETECTED Final    Comment: CRITICAL RESULT CALLED TO, READ BACK BY AND VERIFIED WITH: T GREEN,PHARMD AT 1151 08/13/16 BY L BENFIELD    Klebsiella oxytoca NOT DETECTED NOT DETECTED Final   Klebsiella pneumoniae NOT DETECTED NOT DETECTED Final   Proteus species DETECTED (A) NOT DETECTED Final    Comment: CRITICAL RESULT CALLED TO, READ BACK BY AND VERIFIED WITH: T GREEN,PHARMD AT 1151 08/13/16 BY L BENFIELD    Serratia marcescens NOT DETECTED NOT DETECTED Final   Carbapenem resistance NOT DETECTED NOT DETECTED Final   Haemophilus influenzae NOT DETECTED NOT DETECTED Final   Neisseria meningitidis NOT DETECTED NOT DETECTED Final   Pseudomonas aeruginosa NOT DETECTED NOT DETECTED Final   Candida albicans NOT DETECTED NOT DETECTED Final   Candida glabrata NOT DETECTED NOT DETECTED Final   Candida krusei NOT DETECTED NOT DETECTED Final   Candida parapsilosis NOT DETECTED NOT DETECTED Final   Candida tropicalis NOT DETECTED NOT DETECTED Final    Comment: Performed at [facility], [place]. [address], [place], [place] [address]  Blood culture (routine x 2)     Status: Abnormal (Preliminary result)   Collection Time: 08/12/16  5:53 PM  Result Value Ref Range Status   Specimen Description BLOOD RIGHT HAND  Final   Special Requests   Final    BOTTLES DRAWN AEROBIC AND ANAEROBIC Blood Culture adequate volume   Culture  Setup Time   Final    GRAM NEGATIVE RODS IN BOTH AEROBIC AND ANAEROBIC BOTTLES CRITICAL RESULT CALLED TO, READ BACK BY AND VERIFIED WITH: E WILLIAMSON,PHARMD 1327 08/13/16 BY L BENFIELD GRAM POSITIVE COCCI IN  PAIRS AEROBIC BOTTLE ONLY    Culture  (A)  Final    PROTEUS MIRABILIS GRAM POSITIVE COCCI CULTURE REINCUBATED FOR BETTER GROWTH Performed at [facility], [place] [address] [place], [place] [address]    Report Status PENDING  Incomplete  MRSA PCR Screening     Status: None   Collection Time: 08/13/16  5:50 AM  Result Value Ref Range Status   MRSA by PCR NEGATIVE NEGATIVE Final    Comment:        The GeneXpert MRSA Assay (FDA approved for NASAL specimens only), is one component of a comprehensive MRSA colonization surveillance program. It is not intended to diagnose MRSA infection nor to guide or monitor treatment for MRSA infections.          Radiology Studies: Mr Foot Right Wo Contrast  Result Date: 08/13/2016 CLINICAL DATA:  NONHEALING DIABETIC SOFT TISSUE ULCER IN EXAM: MRI OF THE RIGHT HINDFOOT WITHOUT CONTRAST TECHNIQUE: Multiplanar, multisequence MR imaging of the ankle was performed. No intravenous contrast was administered. COMPARISON:  RADIOGRAPHS DATED 08/12/2016 FINDINGS: TENDONS Peroneal: Intact peroneus longus and peroneus brevis tendons. Posteromedial: Intact tibialis posterior, flexor hallucis longus and flexor digitorum longus tendons. Anterior: Intact tibialis anterior, extensor hallucis longus and extensor digitorum longus tendons. Achilles: Intact. Plantar Fascia: Normal. LIGAMENTS Lateral: Intact. Medial: Intact. CARTILAGE Ankle Joint: Small nonspecific ankle joint effusion. No chondral defect. Subtalar Joints/Sinus Tarsi: No joint effusion or chondral defect. Bones: Abnormal edema and erosion of the superficial cortex of the lateral aspect of the lateral malleolus consistent with osteomyelitis. Overlying soft tissue ulceration. There is periosteal reaction extending 4 cm proximal to the tip of the lateral malleolus. Other: Prominent soft tissue ulceration over the lateral malleolus. Suggestion of soft tissue ulceration posterior laterally adjacent to the posterior calcaneus. No underlying  osteomyelitis at that site. IMPRESSION: Osteomyelitis of the lateral malleolus of the distal fibula. Electronically Signed   By: Lorriane Shire M.D.   On: 08/13/2016 14:35   Mr Foot Left Wo Contrast  Result Date: 08/13/2016 CLINICAL DATA:  Diabetic patient with a nonhealing ulcer on the left heel. EXAM: MRI OF THE LEFT FOOT WITHOUT CONTRAST TECHNIQUE: Multiplanar, multisequence MR imaging of the ankle was performed. No intravenous contrast was administered. COMPARISON:  Plain films left ankle 08/12/2016. FINDINGS: TENDONS Peroneal: Intact. Posteromedial: Intact. Anterior: Intact. Achilles: Intrasubstance increased T2 signal is seen in the distal most fibers of the Achilles tendon. There is a small amount fluid in the retrocalcaneal bursa. The tendon is intact. Plantar Fascia: Intact. LIGAMENTS Lateral: Intact. Medial: Intact. CARTILAGE Ankle Joint: Negative. Subtalar Joints/Sinus Tarsi: Negative. Bones: There is intense marrow edema in the posterior 4.5 cm of the calcaneus. Multiple hypointensities within the substance of the inferior calcaneus are likely due to locules of gas. Much milder degree of edema is seen in the more distal calcaneus extending to the calcaneocuboid joint. Edema is seen about the articulations of the medial and middle cuneiforms and navicular and medial cuneiform. Other: There is a large skin ulceration on the plantar surface of the heel. Air is seen within the ulceration. No focal fluid collection is seen. Imaged intrinsic musculature the foot demonstrates increased T2 signal without focal fluid collection. There is subcutaneous edema about the ankle and foot. IMPRESSION: Large heel ulceration with signal change and gas in at least the posterior 4.5 cm of the calcaneus consistent with emphysematous osteomyelitis. Milder degree in the more distal calcaneus toward the calcaneocuboid joint could be due to osteomyelitis but has an  appearance most suggestive of reactive change. Intense  subcutaneous edema about the ankle and visualized foot consistent with dependent change and/or cellulitis. Mild intrasubstance of the distal most Achilles tendon consistent with tendinosis, possibly infectious given the patient's heel ulceration. Associated small volume of fluid in the retrocalcaneal bursa could be septic or aseptic. Edema within all imaged intrinsic musculature the foot could be due to denervation atrophy and/or inflammatory change. No intramuscular abscess is seen. Degenerative or neuropathic change about the midfoot. Electronically Signed   By: Inge Rise M.D.   On: 08/13/2016 15:09        Scheduled Meds: . amLODipine  10 mg Oral Daily  . atorvastatin  40 mg Oral q1800  . bethanechol  10 mg Oral TID  . carvedilol  25 mg Oral BID WC  . cloNIDine  0.3 mg Oral TID  . feeding supplement (GLUCERNA SHAKE)  237 mL Oral QID  . feeding supplement (PRO-STAT SUGAR FREE 64)  30 mL Oral BID  . FLUoxetine  10 mg Oral Daily  . insulin aspart  0-15 Units Subcutaneous TID WC  . insulin aspart  0-5 Units Subcutaneous QHS  . insulin aspart  3 Units Subcutaneous TID WC  . insulin glargine  60 Units Subcutaneous QHS  . isosorbide-hydrALAZINE  2 tablet Oral TID  . mirtazapine  7.5 mg Oral QHS  . polyethylene glycol  17 g Oral Daily  . QUEtiapine  50 mg Oral Daily  . senna-docusate  1 tablet Oral Daily  . tamsulosin  0.4 mg Oral QPC supper  . zinc sulfate  220 mg Oral Daily   Continuous Infusions: . cefTRIAXone (ROCEPHIN)  IV Stopped (08/15/16 0139)  . heparin 1,800 Units/hr (08/15/16 0351)  . metronidazole Stopped (08/15/16 0945)  . vancomycin Stopped (08/14/16 2326)     LOS: 3 days    Time spent: 40 MINUTES.     Hosie Poisson, MD [facility] Pager [phone]  If 7PM-7AM, please contact night-coverage [email or web address] Password [email or web address] 08/15/2016, 11:56 AM

## 2016-08-15 NOTE — Progress Notes (Signed)
    [facility] for Infectious Disease    Date of Admission:  08/12/2016     Reason for follow up: Polymicrobial bacteremia and bilateral foot osteomyelitis    Subjective: Daughter at bedside. Patient reporting some pain in his legs. Concerned about next steps given their options for surgery. Daughter states they have decided to proceed with surgery, amputation included if necessary.   Medications:  . amLODipine  10 mg Oral Daily  . atorvastatin  40 mg Oral q1800  . bethanechol  10 mg Oral TID  . carvedilol  25 mg Oral BID WC  . cloNIDine  0.3 mg Oral TID  . feeding supplement (GLUCERNA SHAKE)  237 mL Oral QID  . feeding supplement (PRO-STAT SUGAR FREE 64)  30 mL Oral BID  . FLUoxetine  10 mg Oral Daily  . insulin aspart  0-15 Units Subcutaneous TID WC  . insulin aspart  0-5 Units Subcutaneous QHS  . insulin aspart  5 Units Subcutaneous TID WC  . insulin glargine  60 Units Subcutaneous QHS  . isosorbide-hydrALAZINE  2 tablet Oral TID  . mirtazapine  7.5 mg Oral QHS  . polyethylene glycol  17 g Oral Daily  . QUEtiapine  50 mg Oral Daily  . senna-docusate  1 tablet Oral Daily  . tamsulosin  0.4 mg Oral QPC supper  . zinc sulfate  220 mg Oral Daily    Objective: Vital signs in last 24 hours: Temp:  [98.6 F (37 C)-100 F (37.8 C)] 98.6 F (37 C) (06/20 1218) Pulse Rate:  [80-86] 80 (06/20 1218) Resp:  [18-19] 19 (06/20 1218) BP: (145-159)/(63-75) 146/72 (06/20 1218) SpO2:  [96 %-100 %] 100 % (06/20 1218) Weight:  [223 lb 4.8 oz (101.3 kg)] 223 lb 4.8 oz (101.3 kg) (06/20 0332)  General: Well-nourished man in NAD Ext: Bilateral feet wrapped in kerlix. Right lateral malleolus with large wound, deep, yellow wound bed. Left heel with large black eschar.   Lab Results  Recent Labs  08/14/16 0514 08/15/16 0526  WBC 9.6 7.5  HGB 9.8* 8.6*  HCT 29.4* 28.3*  NA 138 135  K 3.6 4.1  CL 105 104  CO2 25 23  BUN 18 19  CREATININE 0.97 1.13   Liver Panel  Recent  Labs  08/12/16 1724  PROT 7.5  ALBUMIN 2.5*  AST 16  ALT 17  ALKPHOS 75  BILITOT 0.3   Sedimentation Rate  Recent Labs  08/15/16 0526  ESRSEDRATE 135*   C-Reactive Protein  Recent Labs  08/14/16 0514 08/15/16 0526  CRP 13.9* 12.6*    Microbiology: Blood cx 6/17>> enterococcus, proteus, e coli    Assessment/Plan:  Polymicrobial bacteremia: Growing enterococcus, proteus, and E coli. Currently on Vancomycin, Ceftriaxone, and Metronidazole. Will await susceptibilities and hopefully be able to narrow. Will need at least 2 weeks IV antibiotics.   Bilateral foot osteomyelitis: Family leaning towards surgery at this point after discussing with Palliative Care. Vascular Surgery likely to do AKA this week.   Attending note to follow.    Albin Felling, MD, MPH Internal Medicine Resident, PGY-III Pager: [phone]  08/15/2016, 5:03 PM

## 2016-08-15 NOTE — Progress Notes (Signed)
I spoke with Charlynn Court of Palliative care and the patient's daughter, Lexine Baton (his POA) tonight.  We are all in agreement to proceed with bilateral above knee amputations.  I will try to get this done on Thursday.  They are aware of the risks and benefits.  He will be NPO after 6am for the procedure.  I will stop his heparin drip on call to [place]

## 2016-08-15 NOTE — Progress Notes (Signed)
Inpatient Diabetes Program Recommendations  AACE/ADA: New Consensus Statement on Inpatient Glycemic Control (2015)  Target Ranges:  Prepandial:   less than 140 mg/dL      Peak postprandial:   less than 180 mg/dL (1-2 hours)      Critically ill patients:  140 - 180 mg/dL   Lab Results  Component Value Date   GLUCAP 283 (H) 08/15/2016   HGBA1C 11.0 (H) 03/29/2016    Review of Glycemic Control  Inpatient Diabetes Program Recommendations:  Please consider A1c to determine prehospital glycemic control.  Thank you, Nani Gasser. Rida Loudin, RN, MSN, CDE  Diabetes Coordinator Inpatient Glycemic Control Team Team Pager [phone] (8am-5pm) 08/15/2016 10:34 AM

## 2016-08-15 NOTE — Progress Notes (Signed)
ANTICOAGULATION CONSULT NOTE - Follow Up Consult  Pharmacy Consult for Heparin (Xarelto on hold) Indication: Hx DVT, severe PAD  Allergies  Allergen Reactions  . Iohexol Hives  . Ivp Dye [Iodinated Diagnostic Agents] Hives    Patient Measurements: Height: 5\' 9"  (175.3 cm) Weight: 223 lb 4.8 oz (101.3 kg) IBW/kg (Calculated) : 70.7 Heparin Dosing Weight: 91 kg  Vital Signs: Temp: 98.6 F (37 C) (06/20 1218) Temp Source: Oral (06/20 1218) BP: 146/72 (06/20 1218) Pulse Rate: 80 (06/20 1218)  Labs:  Recent Labs  08/13/16 0434  08/13/16 1203 08/14/16 0514 08/14/16 1319 08/14/16 2243 08/15/16 0526 08/15/16 1116 08/15/16 1547  HGB 9.3*  --   --  9.8*  --   --  8.6*  --   --   HCT 29.5*  --   --  29.4*  --   --  28.3*  --   --   PLT 240  --   --  282  --   --  263  --   --   APTT  --   < > 45* 55* 92* 42*  --  113*  --   LABPROT  --   --  20.3*  --   --   --   --   --   --   INR  --   --  1.71  --   --   --   --   --   --   HEPARINUNFRC  --   < > 2.00* 0.53  --   --   --  0.39 0.27*  CREATININE 1.10  --   --  0.97  --   --  1.13  --   --   < > = values in this interval not displayed.  Estimated Creatinine Clearance: 81.5 mL/min (by C-G formula based on SCr of 1.13 mg/dL).   Medications:  Scheduled:  . amLODipine  10 mg Oral Daily  . atorvastatin  40 mg Oral q1800  . bethanechol  10 mg Oral TID  . carvedilol  25 mg Oral BID WC  . cloNIDine  0.3 mg Oral TID  . feeding supplement (GLUCERNA SHAKE)  237 mL Oral QID  . feeding supplement (PRO-STAT SUGAR FREE 64)  30 mL Oral BID  . FLUoxetine  10 mg Oral Daily  . insulin aspart  0-15 Units Subcutaneous TID WC  . insulin aspart  0-5 Units Subcutaneous QHS  . insulin aspart  5 Units Subcutaneous TID WC  . insulin glargine  60 Units Subcutaneous QHS  . isosorbide-hydrALAZINE  2 tablet Oral TID  . mirtazapine  7.5 mg Oral QHS  . polyethylene glycol  17 g Oral Daily  . QUEtiapine  50 mg Oral Daily  . senna-docusate  1  tablet Oral Daily  . tamsulosin  0.4 mg Oral QPC supper  . zinc sulfate  220 mg Oral Daily   Infusions:  . cefTRIAXone (ROCEPHIN)  IV Stopped (08/15/16 0139)  . heparin 1,800 Units/hr (08/15/16 0351)  . metronidazole Stopped (08/15/16 0945)  . vancomycin 1,000 mg (08/15/16 1316)    Assessment: 61 yo M on Xarelto PTA for hx DVT. Transitioned to heparin pending plans for treatment of sever critical arterial occlusions.  Heparin level slightly < goal.  No bleeding noted.  Goal of Therapy:  Heparin level 0.3-0.7 units/ml Monitor platelets by anticoagulation protocol: Yes   Plan:  Increase heparin to 2000 units/hr Next heparin level with AM labs.  [employer], Pharm.D., BCPS Clinical  Pharmacist Pager: [phone] 08/15/2016 4:33 PM

## 2016-08-15 NOTE — Progress Notes (Signed)
[facility] for Infectious Disease    Date of Admission:  08/12/2016   Total days of antibiotics 4        Day 4 vanco        Day 4 metro        Day 4 ceftriaxone   ID: [name] is a 61 y.o. male with chronic diastolic CHF, uncontrolled DM 2, hx of CVA, PVD with bilateral foot osteomyelitis complicated by polymicrobial baacteremia Active Problems:   Diabetes type 2, uncontrolled ([place])   Hypertension   Chronic diastolic CHF (congestive heart failure) (HCC)   Peripheral neuropathy   Personal history of DVT (deep vein thrombosis)   History of DVT (deep vein thrombosis)   Coronary artery disease involving native coronary artery of native heart without angina pectoris   Cognitive and behavioral changes   Dysphagia due to recent stroke   AKI (acute kidney injury) ([place])   Pressure injury of skin   History of CVA (cerebrovascular accident)   Osteomyelitis of right foot ([place])   Enterococcal bacteremia   Bacteremia due to Escherichia coli   Proteus infection   Osteomyelitis of left foot (HCC)   Goals of care, counseling/discussion   Palliative care by specialist    Subjective: Afebrile, still has some right leg pain  I have reviewed mri images showing emphysematous osteo to left calcaneous and right lateral mallelous distal fibula osteo  Medications:  . amLODipine  10 mg Oral Daily  . atorvastatin  40 mg Oral q1800  . bethanechol  10 mg Oral TID  . carvedilol  25 mg Oral BID WC  . cloNIDine  0.3 mg Oral TID  . feeding supplement (GLUCERNA SHAKE)  237 mL Oral QID  . feeding supplement (PRO-STAT SUGAR FREE 64)  30 mL Oral BID  . FLUoxetine  10 mg Oral Daily  . insulin aspart  0-15 Units Subcutaneous TID WC  . insulin aspart  0-5 Units Subcutaneous QHS  . insulin aspart  5 Units Subcutaneous TID WC  . insulin glargine  60 Units Subcutaneous QHS  . isosorbide-hydrALAZINE  2 tablet Oral TID  . mirtazapine  7.5 mg Oral QHS  . polyethylene glycol  17 g Oral Daily  .  QUEtiapine  50 mg Oral Daily  . senna-docusate  1 tablet Oral Daily  . tamsulosin  0.4 mg Oral QPC supper  . zinc sulfate  220 mg Oral Daily    Objective: Vital signs in last 24 hours: Temp:  [98.6 F (37 C)-100 F (37.8 C)] 98.6 F (37 C) (06/20 1218) Pulse Rate:  [80-86] 80 (06/20 1218) Resp:  [18-19] 19 (06/20 1218) BP: (145-159)/(63-75) 146/72 (06/20 1218) SpO2:  [96 %-100 %] 100 % (06/20 1218) Weight:  [223 lb 4.8 oz (101.3 kg)] 223 lb 4.8 oz (101.3 kg) (06/20 0332) Physical Exam  Constitutional: He is oriented to person. He appears well-developed and well-nourished. No distress.  HENT:  Mouth/Throat: Oropharynx is clear and moist. No oropharyngeal exudate.  Ext: trace edema bilaterally Skin: hyperpigmenation to legs bilaterally, large left heal dry eschar with drainage on bandage, and right lateral malleolus pressure ulcer with exudate in the wound bed.  Psychiatric: He has a normal mood and affect. His behavior is normal.     Lab Results  Recent Labs  08/14/16 0514 08/15/16 0526  WBC 9.6 7.5  HGB 9.8* 8.6*  HCT 29.4* 28.3*  NA 138 135  K 3.6 4.1  CL 105 104  CO2 25 23  BUN  18 19  CREATININE 0.97 1.13   Liver Panel  Recent Labs  08/12/16 1724  PROT 7.5  ALBUMIN 2.5*  AST 16  ALT 17  ALKPHOS 75  BILITOT 0.3   Sedimentation Rate  Recent Labs  08/15/16 0526  ESRSEDRATE 135*   C-Reactive Protein  Recent Labs  08/14/16 0514 08/15/16 0526  CRP 13.9* 12.6*    Microbiology: 6/19 blood cx ngtd 6/17 blood cx x 1 - proteus (pan S) and enterococcus 6/17 blood cx x 1 -enterococcus plus ecoli -sensi pending BCID showing enterococcus, proteus and ecoli  Studies/Results: No results found.   Assessment/Plan: Diabetic osteomyelitis with polymicrobial bacteremia = continue with empiric regimen. Await sensitivities of enterococcus and ecoli. Can likely narrow to piptazo tomorrow to plan to treat for a total of 2 wk. Due to PVD, unlikely to heal with  current state, agree that amputation may result better in management of current condition, and likely shorter course of abtx exposure.  Spent 35 min with patient and his daughter discussing options   [name] [facility] Cell: [phone] Pager: [phone]  08/15/2016, 4:59 PM

## 2016-08-15 NOTE — Care Management Important Message (Signed)
Important Message  Patient Details  Name: [name] MRN: [id number] Date of Birth: 1955-11-11   Medicare Important Message Given:  Yes    Keino Placencia Abena 08/15/2016, 10:00 AM

## 2016-08-16 ENCOUNTER — Inpatient Hospital Stay (HOSPITAL_COMMUNITY): Payer: Medicare Other | Admitting: Certified Registered Nurse Anesthetist

## 2016-08-16 ENCOUNTER — Encounter (HOSPITAL_COMMUNITY)
Admission: EM | Disposition: A | Discharge status: Home Health | Payer: Self-pay | Source: Home / Self Care | Attending: Internal Medicine

## 2016-08-16 ENCOUNTER — Encounter (HOSPITAL_COMMUNITY): Payer: Self-pay | Admitting: Certified Registered Nurse Anesthetist

## 2016-08-16 HISTORY — PX: AMPUTATION: SHX166

## 2016-08-16 LAB — CULTURE, BLOOD (ROUTINE X 2): SPECIAL REQUESTS: ADEQUATE

## 2016-08-16 LAB — GLUCOSE, CAPILLARY
GLUCOSE-CAPILLARY: 173 mg/dL — AB (ref 65–99)
GLUCOSE-CAPILLARY: 201 mg/dL — AB (ref 65–99)
Glucose-Capillary: 158 mg/dL — ABNORMAL HIGH (ref 65–99)
Glucose-Capillary: 124 mg/dL — ABNORMAL HIGH (ref 65–99)
Glucose-Capillary: 125 mg/dL — ABNORMAL HIGH (ref 65–99)

## 2016-08-16 LAB — CBC
HEMATOCRIT: 28.3 % — AB (ref 39.0–52.0)
HEMOGLOBIN: 9.1 g/dL — AB (ref 13.0–17.0)
MCH: 27.8 pg (ref 26.0–34.0)
MCHC: 32.2 g/dL (ref 30.0–36.0)
MCV: 86.5 fL (ref 78.0–100.0)
Platelets: 279 10*3/uL (ref 150–400)
RBC: 3.27 MIL/uL — AB (ref 4.22–5.81)
RDW: 14.4 % (ref 11.5–15.5)
WBC: 9.7 10*3/uL (ref 4.0–10.5)

## 2016-08-16 LAB — HEPARIN LEVEL (UNFRACTIONATED): Heparin Unfractionated: 0.43 IU/mL (ref 0.30–0.70)

## 2016-08-16 LAB — C-REACTIVE PROTEIN: CRP: 9.3 mg/dL — ABNORMAL HIGH (ref <1.0)

## 2016-08-16 LAB — POCT I-STAT 4, (NA,K, GLUC, HGB,HCT)
Glucose, Bld: 110 mg/dL — ABNORMAL HIGH (ref 65–99)
HEMATOCRIT: 22 % — AB (ref 39.0–52.0)
Hemoglobin: 7.5 g/dL — ABNORMAL LOW (ref 13.0–17.0)
Potassium: 4.2 mmol/L (ref 3.5–5.1)
Sodium: 141 mmol/L (ref 135–145)

## 2016-08-16 LAB — PROTIME-INR
INR: 1.1
Prothrombin Time: 14.2 seconds (ref 11.4–15.2)

## 2016-08-16 LAB — BASIC METABOLIC PANEL
Anion gap: 8 (ref 5–15)
BUN: 19 mg/dL (ref 6–20)
CHLORIDE: 105 mmol/L (ref 101–111)
CO2: 23 mmol/L (ref 22–32)
Calcium: 8.5 mg/dL — ABNORMAL LOW (ref 8.9–10.3)
Creatinine, Ser: 0.99 mg/dL (ref 0.61–1.24)
GFR calc Af Amer: >60 mL/min (ref >60)
GFR calc non Af Amer: >60 mL/min (ref >60)
Glucose, Bld: 185 mg/dL — ABNORMAL HIGH (ref 65–99)
POTASSIUM: 4.4 mmol/L (ref 3.5–5.1)
Sodium: 136 mmol/L (ref 135–145)

## 2016-08-16 LAB — HEMOGLOBIN A1C
Hgb A1c MFr Bld: 8.3 % — ABNORMAL HIGH (ref 4.8–5.6)
Mean Plasma Glucose: 192 mg/dL

## 2016-08-16 LAB — SEDIMENTATION RATE: Sed Rate: 138 mm/hr — ABNORMAL HIGH (ref 0–16)

## 2016-08-16 LAB — ABO/RH: ABO/RH(D): B POS

## 2016-08-16 LAB — PREPARE RBC (CROSSMATCH)

## 2016-08-16 SURGERY — AMPUTATION, ABOVE KNEE
Anesthesia: General | Site: Leg Upper | Laterality: Bilateral

## 2016-08-16 MED ORDER — HYDROMORPHONE HCL 1 MG/ML IJ SOLN
0.5000 mg | INTRAMUSCULAR | Status: DC | PRN
  Administered 2016-08-16: 2 mg via INTRAVENOUS
  Administered 2016-08-17 (×2): 0.5 mg via INTRAVENOUS
  Filled 2016-08-16 (×2): qty 0.5
  Filled 2016-08-16: qty 1
  Filled 2016-08-16: qty 2

## 2016-08-16 MED ORDER — MORPHINE SULFATE (PF) 2 MG/ML IV SOLN
1.0000 mg | INTRAVENOUS | Status: DC | PRN
  Administered 2016-08-16 (×2): 3 mg via INTRAVENOUS
  Filled 2016-08-16 (×2): qty 2

## 2016-08-16 MED ORDER — LACTATED RINGERS IV SOLN
INTRAVENOUS | Status: DC | PRN
  Administered 2016-08-16: 13:00:00 via INTRAVENOUS

## 2016-08-16 MED ORDER — FENTANYL CITRATE (PF) 100 MCG/2ML IJ SOLN
INTRAMUSCULAR | Status: DC | PRN
  Administered 2016-08-16 (×2): 50 ug via INTRAVENOUS
  Administered 2016-08-16: 25 ug via INTRAVENOUS
  Administered 2016-08-16: 100 ug via INTRAVENOUS
  Administered 2016-08-16: 50 ug via INTRAVENOUS

## 2016-08-16 MED ORDER — DOCUSATE SODIUM 100 MG PO CAPS
100.0000 mg | ORAL_CAPSULE | Freq: Every day | ORAL | Status: DC
  Administered 2016-08-24 – 2016-08-27 (×4): 100 mg via ORAL
  Filled 2016-08-16 (×7): qty 1

## 2016-08-16 MED ORDER — MIDAZOLAM HCL 2 MG/2ML IJ SOLN
INTRAMUSCULAR | Status: AC
  Filled 2016-08-16: qty 2

## 2016-08-16 MED ORDER — ONDANSETRON HCL 4 MG/2ML IJ SOLN
INTRAMUSCULAR | Status: DC | PRN
Start: 2016-08-16 — End: 2016-08-16
  Administered 2016-08-16: 4 mg via INTRAVENOUS

## 2016-08-16 MED ORDER — FENTANYL CITRATE (PF) 250 MCG/5ML IJ SOLN
INTRAMUSCULAR | Status: AC
  Filled 2016-08-16: qty 5

## 2016-08-16 MED ORDER — SODIUM CHLORIDE 0.9 % IV SOLN: Freq: Once | INTRAVENOUS | Status: DC

## 2016-08-16 MED ORDER — SUGAMMADEX SODIUM 200 MG/2ML IV SOLN
INTRAVENOUS | Status: DC | PRN
  Administered 2016-08-16: 200 mg via INTRAVENOUS

## 2016-08-16 MED ORDER — HEPARIN (PORCINE) IN NACL 100-0.45 UNIT/ML-% IJ SOLN
2100.0000 [IU]/h | INTRAMUSCULAR | Status: DC
  Administered 2016-08-17: 1900 [IU]/h via INTRAVENOUS
  Administered 2016-08-17: 1600 [IU]/h via INTRAVENOUS
  Administered 2016-08-18 – 2016-08-19 (×3): 2100 [IU]/h via INTRAVENOUS
  Filled 2016-08-16 (×5): qty 250

## 2016-08-16 MED ORDER — PHENYLEPHRINE HCL 10 MG/ML IJ SOLN
INTRAMUSCULAR | Status: DC | PRN
  Administered 2016-08-16: 80 ug via INTRAVENOUS

## 2016-08-16 MED ORDER — PHENOL 1.4 % MT LIQD: 1.0000 | OROMUCOSAL | Status: DC | PRN

## 2016-08-16 MED ORDER — ROCURONIUM BROMIDE 10 MG/ML (PF) SYRINGE
PREFILLED_SYRINGE | INTRAVENOUS | Status: DC | PRN
  Administered 2016-08-16: 20 mg via INTRAVENOUS

## 2016-08-16 MED ORDER — LIDOCAINE HCL (CARDIAC) 20 MG/ML IV SOLN
INTRAVENOUS | Status: DC | PRN
  Administered 2016-08-16: 100 mg via INTRAVENOUS

## 2016-08-16 MED ORDER — PROPOFOL 10 MG/ML IV BOLUS
INTRAVENOUS | Status: AC
  Filled 2016-08-16: qty 20

## 2016-08-16 MED ORDER — PHENYLEPHRINE HCL 10 MG/ML IJ SOLN
INTRAMUSCULAR | Status: DC | PRN
  Administered 2016-08-16: 30 ug/min via INTRAVENOUS

## 2016-08-16 MED ORDER — LABETALOL HCL 5 MG/ML IV SOLN
INTRAVENOUS | Status: AC
  Filled 2016-08-16: qty 4

## 2016-08-16 MED ORDER — LABETALOL HCL 5 MG/ML IV SOLN
INTRAVENOUS | Status: AC
  Administered 2016-08-16: 10 mg
  Filled 2016-08-16: qty 4

## 2016-08-16 MED ORDER — 0.9 % SODIUM CHLORIDE (POUR BTL) OPTIME
TOPICAL | Status: DC | PRN
  Administered 2016-08-16 (×3): 1000 mL

## 2016-08-16 MED ORDER — KETOROLAC TROMETHAMINE 30 MG/ML IJ SOLN: 30.0000 mg | Freq: Four times a day (QID) | INTRAMUSCULAR | Status: DC | PRN

## 2016-08-16 MED ORDER — SUCCINYLCHOLINE CHLORIDE 200 MG/10ML IV SOSY
PREFILLED_SYRINGE | INTRAVENOUS | Status: DC | PRN
  Administered 2016-08-16: 100 mg via INTRAVENOUS

## 2016-08-16 MED ORDER — LABETALOL HCL 5 MG/ML IV SOLN
10.0000 mg | INTRAVENOUS | Status: AC | PRN
  Administered 2016-08-16 – 2016-08-21 (×4): 10 mg via INTRAVENOUS
  Filled 2016-08-16 (×3): qty 4

## 2016-08-16 MED ORDER — HYDROMORPHONE HCL 1 MG/ML IJ SOLN
INTRAMUSCULAR | Status: AC
  Filled 2016-08-16: qty 1

## 2016-08-16 MED ORDER — SODIUM CHLORIDE 0.9 % IV SOLN
INTRAVENOUS | Status: DC
  Administered 2016-08-16 – 2016-08-17 (×2): via INTRAVENOUS

## 2016-08-16 MED ORDER — EPHEDRINE SULFATE-NACL 50-0.9 MG/10ML-% IV SOSY
PREFILLED_SYRINGE | INTRAVENOUS | Status: DC | PRN
  Administered 2016-08-16: 10 mg via INTRAVENOUS

## 2016-08-16 MED ORDER — GUAIFENESIN-DM 100-10 MG/5ML PO SYRP
15.0000 mL | ORAL_SOLUTION | ORAL | Status: DC | PRN
Start: 2016-08-16 — End: 2016-08-27

## 2016-08-16 MED ORDER — MAGNESIUM SULFATE 2 GM/50ML IV SOLN
2.0000 g | Freq: Every day | INTRAVENOUS | Status: DC | PRN
  Filled 2016-08-16: qty 50

## 2016-08-16 MED ORDER — PANTOPRAZOLE SODIUM 40 MG PO TBEC
40.0000 mg | DELAYED_RELEASE_TABLET | Freq: Every day | ORAL | Status: DC
  Administered 2016-08-20 – 2016-08-27 (×6): 40 mg via ORAL
  Filled 2016-08-16 (×10): qty 1

## 2016-08-16 MED ORDER — SODIUM CHLORIDE 0.9 % IV SOLN
INTRAVENOUS | Status: DC | PRN
  Administered 2016-08-16: 12:00:00 via INTRAVENOUS

## 2016-08-16 MED ORDER — KETOROLAC TROMETHAMINE 30 MG/ML IJ SOLN
30.0000 mg | Freq: Four times a day (QID) | INTRAMUSCULAR | Status: DC | PRN
  Administered 2016-08-16 – 2016-08-17 (×2): 30 mg via INTRAVENOUS
  Filled 2016-08-16: qty 1

## 2016-08-16 MED ORDER — OXYCODONE-ACETAMINOPHEN 5-325 MG PO TABS
1.0000 | ORAL_TABLET | ORAL | Status: DC | PRN
  Administered 2016-08-16: 2 via ORAL
  Administered 2016-08-18: 1 via ORAL
  Administered 2016-08-19: 2 via ORAL
  Administered 2016-08-19 (×2): 1 via ORAL
  Administered 2016-08-20: 2 via ORAL
  Filled 2016-08-16: qty 1
  Filled 2016-08-16 (×2): qty 2
  Filled 2016-08-16 (×2): qty 1
  Filled 2016-08-16 (×3): qty 2

## 2016-08-16 MED ORDER — KETOROLAC TROMETHAMINE 30 MG/ML IJ SOLN
30.0000 mg | Freq: Four times a day (QID) | INTRAMUSCULAR | Status: DC
  Filled 2016-08-16: qty 1

## 2016-08-16 MED ORDER — ALBUMIN HUMAN 5 % IV SOLN
INTRAVENOUS | Status: DC | PRN
  Administered 2016-08-16: 14:00:00 via INTRAVENOUS

## 2016-08-16 MED ORDER — ALUM & MAG HYDROXIDE-SIMETH 200-200-20 MG/5ML PO SUSP: 15.0000 mL | ORAL | Status: DC | PRN

## 2016-08-16 MED ORDER — METOPROLOL TARTRATE 5 MG/5ML IV SOLN: 2.0000 mg | INTRAVENOUS | Status: DC | PRN

## 2016-08-16 MED ORDER — PROPOFOL 10 MG/ML IV BOLUS
INTRAVENOUS | Status: DC | PRN
  Administered 2016-08-16 (×2): 30 mg via INTRAVENOUS
  Administered 2016-08-16 (×2): 20 mg via INTRAVENOUS
  Administered 2016-08-16: 100 mg via INTRAVENOUS
  Administered 2016-08-16: 30 mg via INTRAVENOUS
  Administered 2016-08-16: 50 mg via INTRAVENOUS

## 2016-08-16 MED ORDER — HYDRALAZINE HCL 20 MG/ML IJ SOLN
5.0000 mg | INTRAMUSCULAR | Status: AC | PRN
  Administered 2016-08-16 – 2016-08-19 (×2): 5 mg via INTRAVENOUS
  Filled 2016-08-16 (×2): qty 1

## 2016-08-16 MED ORDER — POTASSIUM CHLORIDE CRYS ER 20 MEQ PO TBCR
20.0000 meq | EXTENDED_RELEASE_TABLET | Freq: Every day | ORAL | Status: AC | PRN
Start: 2016-08-16 — End: 2016-08-26
  Administered 2016-08-26: 20 meq via ORAL

## 2016-08-16 MED ORDER — HYDROMORPHONE HCL 1 MG/ML IJ SOLN
0.5000 mg | INTRAMUSCULAR | Status: DC | PRN
  Administered 2016-08-16 (×2): 0.5 mg via INTRAVENOUS

## 2016-08-16 SURGICAL SUPPLY — 50 items
BANDAGE ACE 4X5 VEL STRL LF (GAUZE/BANDAGES/DRESSINGS) IMPLANT
BANDAGE ACE 6X5 VEL STRL LF (GAUZE/BANDAGES/DRESSINGS) ×5 IMPLANT
BLADE SAW GIGLI 510 (BLADE) ×3 IMPLANT
BLADE SAW GIGLI 510MM (BLADE) ×2
BNDG COHESIVE 6X5 TAN STRL LF (GAUZE/BANDAGES/DRESSINGS) ×5 IMPLANT
BNDG GAUZE ELAST 4 BULKY (GAUZE/BANDAGES/DRESSINGS) ×10 IMPLANT
CANISTER SUCT 3000ML PPV (MISCELLANEOUS) ×3 IMPLANT
CLIP TI MEDIUM 6 (CLIP) ×3 IMPLANT
COVER SURGICAL LIGHT HANDLE (MISCELLANEOUS) ×3 IMPLANT
DRAIN CHANNEL 19F RND (DRAIN) IMPLANT
DRAPE HALF SHEET 40X57 (DRAPES) ×1 IMPLANT
DRAPE ORTHO SPLIT 77X108 STRL (DRAPES) ×6
DRAPE SURG ORHT 6 SPLT 77X108 (DRAPES) ×2 IMPLANT
DRSG ADAPTIC 3X8 NADH LF (GAUZE/BANDAGES/DRESSINGS) ×5 IMPLANT
ELECT REM PT RETURN 9FT ADLT (ELECTROSURGICAL) ×3
ELECTRODE REM PT RTRN 9FT ADLT (ELECTROSURGICAL) ×1 IMPLANT
EVACUATOR SILICONE 100CC (DRAIN) IMPLANT
GAUZE SPONGE 4X4 12PLY STRL (GAUZE/BANDAGES/DRESSINGS) ×6 IMPLANT
GAUZE SPONGE 4X4 16PLY XRAY LF (GAUZE/BANDAGES/DRESSINGS) ×3 IMPLANT
GLOVE BIOGEL M 6.5 STRL (GLOVE) ×2 IMPLANT
GLOVE BIOGEL PI IND STRL 7.0 (GLOVE) IMPLANT
GLOVE BIOGEL PI IND STRL 7.5 (GLOVE) ×2 IMPLANT
GLOVE BIOGEL PI INDICATOR 7.0 (GLOVE) ×2
GLOVE BIOGEL PI INDICATOR 7.5 (GLOVE) ×4
GLOVE ECLIPSE 7.5 STRL STRAW (GLOVE) ×4 IMPLANT
GLOVE SURG SS PI 7.5 STRL IVOR (GLOVE) ×6 IMPLANT
GOWN STRL REUS W/ TWL LRG LVL3 (GOWN DISPOSABLE) ×2 IMPLANT
GOWN STRL REUS W/ TWL XL LVL3 (GOWN DISPOSABLE) ×5 IMPLANT
GOWN STRL REUS W/TWL LRG LVL3 (GOWN DISPOSABLE) ×6
GOWN STRL REUS W/TWL XL LVL3 (GOWN DISPOSABLE) ×15
KIT BASIN OR (CUSTOM PROCEDURE TRAY) ×3 IMPLANT
KIT ROOM TURNOVER OR (KITS) ×3 IMPLANT
NS IRRIG 1000ML POUR BTL (IV SOLUTION) ×7 IMPLANT
PACK GENERAL/GYN (CUSTOM PROCEDURE TRAY) ×3 IMPLANT
PAD ARMBOARD 7.5X6 YLW CONV (MISCELLANEOUS) ×6 IMPLANT
SPONGE LAP 18X18 X RAY DECT (DISPOSABLE) ×15 IMPLANT
STAPLER VISISTAT 35W (STAPLE) ×3 IMPLANT
STOCKINETTE IMPERVIOUS LG (DRAPES) ×3 IMPLANT
SUT ETHILON 3 0 PS 1 (SUTURE) IMPLANT
SUT SILK 0 TIES 10X30 (SUTURE) ×3 IMPLANT
SUT SILK 2 0 (SUTURE)
SUT SILK 2-0 18XBRD TIE 12 (SUTURE) IMPLANT
SUT SILK 3 0 (SUTURE)
SUT SILK 3-0 18XBRD TIE 12 (SUTURE) IMPLANT
SUT VIC AB 2-0 CT1 18 (SUTURE) ×15 IMPLANT
TAPE UMBILICAL COTTON 1/8X30 (MISCELLANEOUS) ×3 IMPLANT
TOWEL OR 17X24 6PK STRL BLUE (TOWEL DISPOSABLE) ×1 IMPLANT
TOWEL OR 17X26 10 PK STRL BLUE (TOWEL DISPOSABLE) ×3 IMPLANT
UNDERPAD 30X30 (UNDERPADS AND DIAPERS) ×3 IMPLANT
WATER STERILE IRR 1000ML POUR (IV SOLUTION) ×3 IMPLANT

## 2016-08-16 NOTE — Op Note (Signed)
    Patient name: [name] MRN: [id number] DOB: [birth year] Sex: male  08/12/2016 - 08/16/2016 Pre-operative Diagnosis: Lateral lower extremity ulcers Post-operative diagnosis:  Same Surgeon:  [name] Assistants:  Gerri Lins Procedure:   #1: Right above-knee amputation   #2: Left above-knee amputation Anesthesia:  Gen. Blood Loss:  See anesthesia record Specimens:  Bilateral above-knee amputation  Findings:  Healthy-appearing muscle at the level of amputation  Indications:  The patient has a history of bilateral heel wounds.  These have progressed significantly to where both legs are not salvageable.  I had an extensive conversation with both palliative care and the family.  Everybody is in agreement  Procedure:  The patient was identified in the holding area and taken to [employer] [facility]  The patient was then placed supine on the table. general anesthesia was administered.  The patient was prepped and draped in the usual sterile fashion.  A time out was called and antibiotics were administered.  A fishmouth incision was made in bilateral thighs, just above the knee.  Cautery was used to divide subcutaneous tissue down to the fascia.  I then divided the fascia and muscle until I had circumferential control of the femur bilaterally.  A Gigli saw was used to transect the femur, beveling the anterior surface.  The bone was smoothed with a rasp.  I then isolated bilateral neurovascular bundles.  The nerve artery and vein were individually ligated proximal to the cut edge of the femur.  Next, the remaining muscle and soft tissue was divided and the leg was removed as a specimen.  Both extremities were then copiously irrigated.  Hemostasis was achieved.  I then reapproximated the fascia on both legs with interrupted 2-0 Vicryl suture followed by staples.  Sterile dressings were applied.  There were no immediate complications.   Disposition:  To PACU stable   V. [name],  M.D. [facility] Office: [phone] Pager:  [phone]

## 2016-08-16 NOTE — Progress Notes (Signed)
ANTICOAGULATION CONSULT NOTE - Follow Up Consult  Pharmacy Consult for Heparin (Xarelto on hold) Indication: Hx DVT, severe PAD  Allergies  Allergen Reactions  . Iohexol Hives  . Ivp Dye [Iodinated Diagnostic Agents] Hives    Patient Measurements: Height: 5\' 9"  (175.3 cm) Weight: 227 lb 8 oz (103.2 kg) IBW/kg (Calculated) : 70.7  Vital Signs: Temp: 97.2 F (36.2 C) (06/21 1527) BP: 188/68 (06/21 1720) Pulse Rate: 80 (06/21 1720)  Labs:  Recent Labs  08/14/16 0514 08/14/16 1319 08/14/16 2243 08/15/16 0526 08/15/16 1116 08/15/16 1547 08/16/16 0358 08/16/16 1406  HGB 9.8*  --   --  8.6*  --   --  9.1* 7.5*  HCT 29.4*  --   --  28.3*  --   --  28.3* 22.0*  PLT 282  --   --  263  --   --  279  --   APTT 55* 92* 42*  --  113*  --   --   --   LABPROT  --   --   --   --   --   --  14.2  --   INR  --   --   --   --   --   --  1.10  --   HEPARINUNFRC 0.53  --   --   --  0.39 0.27* 0.43  --   CREATININE 0.97  --   --  1.13  --   --  0.99  --     Estimated Creatinine Clearance: 93.9 mL/min (by C-G formula based on SCr of 0.99 mg/dL).  Assessment: 38 yom on xarelto PTA for hx of DVT, on IV heparin while Xarelto is on hold for Vascular procedure. Now s/p B/L AKA, pharmacy is consulted to restart IV heparin tomorrow AM at 0800. He was previously therapeutic on 2000 units/hr. But now might require lower rate after surgery given significant body weight change.    Goal of Therapy:  Heparin level 0.3-0.7 units/ml Monitor platelets by anticoagulation protocol: Yes   Plan:  Restart IV heparin with no bolus 1600 units/hr at 0800 on 6/22 F/u 6 hr heparin level at 1400 Daily CBC/heparin level Monitor for s/sx bleeding F/u plan for restarting Xarelto RN please update pt's weight  Maryanna Shape, PharmD, BCPS  Clinical Pharmacist  Pager: [phone]   08/16/2016 6:17 PM

## 2016-08-16 NOTE — Progress Notes (Addendum)
ANTICOAGULATION CONSULT NOTE - Follow Up Consult  Pharmacy Consult for Heparin (Xarelto on hold) Indication: Hx DVT, severe PAD  Allergies  Allergen Reactions  . Iohexol Hives  . Ivp Dye [Iodinated Diagnostic Agents] Hives    Patient Measurements: Height: 5\' 9"  (175.3 cm) Weight: 227 lb 8 oz (103.2 kg) IBW/kg (Calculated) : 70.7  Vital Signs: Temp: 99.5 F (37.5 C) (06/21 0426) Temp Source: Oral (06/21 0426) BP: 158/60 (06/21 0426) Pulse Rate: 78 (06/21 0426)  Labs:  Recent Labs  08/13/16 1203  08/14/16 0514 08/14/16 1319 08/14/16 2243 08/15/16 0526 08/15/16 1116 08/15/16 1547 08/16/16 0358  HGB  --   < > 9.8*  --   --  8.6*  --   --  9.1*  HCT  --   --  29.4*  --   --  28.3*  --   --  28.3*  PLT  --   --  282  --   --  263  --   --  279  APTT 45*  --  55* 92* 42*  --  113*  --   --   LABPROT 20.3*  --   --   --   --   --   --   --  14.2  INR 1.71  --   --   --   --   --   --   --  1.10  HEPARINUNFRC 2.00*  --  0.53  --   --   --  0.39 0.27* 0.43  CREATININE  --   --  0.97  --   --  1.13  --   --  0.99  < > = values in this interval not displayed.  Estimated Creatinine Clearance: 93.9 mL/min (by C-G formula based on SCr of 0.99 mg/dL).  Assessment: 13 yom continuing on heparin while Xarelto on hold for Vascular procedure. Heparin level now therapeutic on rate increase. No need to follow aPTTs further. Hg low stable, plt wnl. No bleed documented. Scheduled for bilateral AKAs on 6/21.  Goal of Therapy:  Heparin level 0.3-0.7 units/ml Monitor platelets by anticoagulation protocol: Yes   Plan:  Continue heparin at 1800 units/hr Daily CBC/heparin level Monitor for s/sx bleeding F/u anticoagulation plan post-op - Xarelto on hold   Elicia Lamp, PharmD, BCPS Clinical Pharmacist Rx Phone # for today: [phone] After 3:30PM, please call Main Rx: [phone] 08/16/2016 8:57 AM

## 2016-08-16 NOTE — Anesthesia Procedure Notes (Deleted)
Performed by: Merdis Delay

## 2016-08-16 NOTE — Anesthesia Procedure Notes (Signed)
Procedure Name: Intubation Date/Time: 08/16/2016 12:26 PM Performed by: Merdis Delay Pre-anesthesia Checklist: Patient identified, Emergency Drugs available, Suction available, Patient being monitored and Timeout performed Patient Re-evaluated:Patient Re-evaluated prior to inductionOxygen Delivery Method: Circle system utilized Preoxygenation: Pre-oxygenation with 100% oxygen Intubation Type: IV induction Ventilation: Oral airway inserted - appropriate to patient size Laryngoscope Size: Mac and 4 Grade View: Grade I Tube size: 7.5 mm Number of attempts: 1 Airway Equipment and Method: Stylet and Patient positioned with wedge pillow Placement Confirmation: ETT inserted through vocal cords under direct vision,  positive ETCO2 and breath sounds checked- equal and bilateral Secured at: 23 cm Tube secured with: Tape Dental Injury: Teeth and Oropharynx as per pre-operative assessment  Comments: Inserted by Dicie Beam SRNA

## 2016-08-16 NOTE — H&P (View-Only) (Signed)
I spoke with Charlynn Court of Palliative care and the patient's daughter, Lexine Baton (his POA) tonight.  We are all in agreement to proceed with bilateral above knee amputations.  I will try to get this done on Thursday.  They are aware of the risks and benefits.  He will be NPO after 6am for the procedure.  I will stop his heparin drip on call to [place]

## 2016-08-16 NOTE — Progress Notes (Signed)
Daily Progress Note   Patient Name: [name]       Date: 08/16/2016 DOB: 1955/06/17  Age: 61 y.o. MRN#: [id number] Attending Physician: Hosie Poisson, MD Primary Care Physician: Maury Dus, MD Admit Date: 08/12/2016  Reason for Consultation/Follow-up: Establishing goals of care and Psychosocial/spiritual support  Subjective: [name] was more withdrawn this morning. He did tell me that his pain was well controlled and he had no questions regarding the plan for his surgery (expected today). I did query his understanding of the surgery, and he ws able to explain that his legs were rotting and surgeons were going to cut them off above the knee. He is very concerned about "what comes after they cut them off," but did not want to talk with me further about his concerns. No family at the bedside.   Length of Stay: 4  Current Medications: Scheduled Meds:  . amLODipine  10 mg Oral Daily  . atorvastatin  40 mg Oral q1800  . bethanechol  10 mg Oral TID  . carvedilol  25 mg Oral BID WC  . cloNIDine  0.3 mg Oral TID  . feeding supplement (GLUCERNA SHAKE)  237 mL Oral QID  . feeding supplement (PRO-STAT SUGAR FREE 64)  30 mL Oral BID  . FLUoxetine  10 mg Oral Daily  . insulin aspart  0-15 Units Subcutaneous TID WC  . insulin aspart  0-5 Units Subcutaneous QHS  . insulin aspart  5 Units Subcutaneous TID WC  . insulin glargine  60 Units Subcutaneous QHS  . isosorbide-hydrALAZINE  2 tablet Oral TID  . mirtazapine  7.5 mg Oral QHS  . polyethylene glycol  17 g Oral Daily  . QUEtiapine  50 mg Oral Daily  . senna-docusate  1 tablet Oral Daily  . tamsulosin  0.4 mg Oral QPC supper  . zinc sulfate  220 mg Oral Daily    Continuous Infusions: . cefTRIAXone (ROCEPHIN)  IV Stopped (08/16/16 0302)  . cefUROXime (ZINACEF)  IV    . heparin 2,000  Units/hr (08/15/16 1742)  . metronidazole Stopped (08/16/16 0410)  . vancomycin 1,000 mg (08/16/16 0829)    PRN Meds: acetaminophen **OR** acetaminophen, albuterol, bisacodyl, HYDROcodone-acetaminophen, hydrocortisone, lidocaine, ondansetron **OR** ondansetron (ZOFRAN) IV, sodium phosphate  Physical Exam      Constitutional: He appears well-developed and well-nourished. No distress.  HENT:  Head: Normocephalic and atraumatic.  Mouth/Throat: Oropharynx is clear. Abnormal dentition. No oropharyngeal exudate.  Mouth dry. Eyes: EOM are normal.  Vision impaired. Neck: Normal range of motion. Neck supple.  Cardiovascular: Normal rate and regular rhythm.   Pulmonary/Chest: Effort normal and breath sounds normal. No respiratory distress.  Abdominal: Soft. Bowel sounds are normal.  Musculoskeletal:  Weakness on right side, but able to move all extremities.   Neurological:  Alert. Oriented to person, place, and situation. Confused on time.  Follows simple commands. Skin: Skin is warm and dry.  Wound dressings on bilateral feet, could not visualize wound. Feet with significant odor. Dressings with yellow serous drainage saturating around heels.   Psychiatric: His speech is delayed. He is slowed. Cognition and memory are impaired.     Withdrawn.  Vital Signs: BP (!) 158/60 (BP Location: Left  Arm)   Pulse 78   Temp 99.5 F (37.5 C) (Oral)   Resp 18   Ht 5\' 9"  (1.753 m)   Wt 103.2 kg (227 lb 8 oz)   SpO2 95%   BMI 33.60 kg/m  SpO2: SpO2: 95 % O2 Device: O2 Device: Not Delivered O2 Flow Rate:    Intake/output summary:   Intake/Output Summary (Last 24 hours) at 08/16/16 0854 Last data filed at 08/16/16 2229  Gross per 24 hour  Intake              480 ml  Output             1800 ml  Net            -1320 ml   LBM: Last BM Date: 08/14/16 Baseline Weight: Weight: 104.3 kg (230 lb) Most recent weight: Weight: 103.2 kg (227 lb 8 oz)  Palliative Assessment/Data: PPS  40-50%   Flowsheet Rows     Most Recent Value  Intake Tab  Referral Department  Hospitalist  Unit at Time of Referral  Med/Surg Unit  Palliative Care Primary Diagnosis  Sepsis/Infectious Disease  Date Notified  08/14/16  Palliative Care Type  New Palliative care  Reason for referral  Clarify Goals of Care  Date of Admission  08/12/16  Date first seen by Palliative Care  08/14/16  # of days Palliative referral response time  0 Day(s)  # of days IP prior to Palliative referral  2  Clinical Assessment  Psychosocial & Spiritual Assessment  Palliative Care Outcomes      Patient Active Problem List   Diagnosis Date Noted  . Goals of care, counseling/discussion   . Palliative care by specialist   . Enterococcal bacteremia 08/13/2016  . Bacteremia due to Escherichia coli 08/13/2016  . Proteus infection 08/13/2016  . Osteomyelitis of left foot ([place]) 08/13/2016  . History of CVA (cerebrovascular accident) 08/12/2016  . Osteomyelitis of right foot ([place]) 08/12/2016  . HLD (hyperlipidemia) 05/15/2016  . AKI (acute kidney injury) ([place]) 05/10/2016  . Pressure injury of skin 05/10/2016  . Dysphagia due to recent stroke 04/28/2016  . Hyperlipidemia associated with type 2 diabetes mellitus ([place]) 04/28/2016  . Depression 04/28/2016  . Cognitive and behavioral changes   . Cerebrovascular accident (CVA) due to embolism of left posterior cerebral artery ([place])   . Renovascular hypertension   . History of DVT (deep vein thrombosis)   . Coronary artery disease involving native coronary artery of native heart without angina pectoris   . Morbid obesity ([place])   . Normocytic anemia   . Embolic stroke involving posterior cerebral artery ([place]) 04/03/2016  . Brainstem stroke ([place]) 04/01/2016  . Abnormality of gait and mobility   . Cerebral infarction due to thrombosis of other cerebral artery ([place])   . Right arm numbness   . Left sided numbness 03/29/2016  . TIA (transient ischemic attack)  03/29/2016  . Hypertensive urgency 03/29/2016  . Sepsis ([place]) 09/10/2013  . Personal history of DVT (deep vein thrombosis) 01/01/2013  . Diabetes type 2, uncontrolled ([place]) 12/29/2012  . Hypertension   . Cardiomyopathy ([place])   . Chronic diastolic CHF (congestive heart failure) ([place])   . Peripheral neuropathy   . Special screening for malignant neoplasms, colon 12/09/2012    Palliative Care Assessment & Plan   HPI: 61 y.o. male  with past medical history of DM2, HT N, dCHF, CAD s/p stenting, PAD, prior DVT on anticoagulation, depression, and prior CVA with dysarthria. He  has been followed in the outpatient setting for PAD with non-healing wounds on both feet. He then presented to the ED from SNF with fever. He was admitted on 08/12/2016. Work-up revealed sepsis secondary to osteomyelitis of feet. Vascular and ID following. Palliative consulted to assist in goals of care clarification.  Assessment: After multiple conversations with Mr. Edmundson and his family, they ultimately made the decision to proceed with amputation. I did have a serious discussion with them about the potential complications with this option, which included the risk of the surgery itself given his cardiac issues, the high potential for post-surgical complications including delirium, and the significant debility that comes from the loss of both legs above the knee. In considering these things, I emphasized the importance of considering quality of life and the potential for prolonged suffering. They were receptive to this information, but felt it was the only option that would prolong his life. [name] also felt that they could work towards improving quality of life over time. I shared this information with Dr. Trula Slade (Vascular).  Today, Mr. Westling does have a clear understanding of the plan. I called [name] and she is similarly prepared for the surgery today and the breadth of possible associated issues. We talked through some of her  concerns and fears. She asked that I follow-up with them again tomorrow, and I explained we would continue to follow throughout his hospitalization.   Recommendations/Plan:  Full code  Plan for surgery today  Palliative to continue to follow and support this hosptialization  Code Status:  Full code  Prognosis:   Unable to determine  Discharge Planning:  To Be Determined  Care plan was discussed with pt, Nicole/HCPOA, Brandi  Thank you for allowing the Palliative Medicine Team to assist in the care of this patient.  Total time: 25 minutes    Greater than 50%  of this time was spent counseling and coordinating care related to the above assessment and plan.  Charlynn Court, NP Palliative Medicine Team [phone] pager (7a-5p) Team Phone # [phone]

## 2016-08-16 NOTE — Progress Notes (Signed)
Patient arrived back from OR via @ 1725; patient c/o pain; 1 unit blood infusion and completed at 1800; 3 mg morphine given; patient rolled and reposition in bed; HIgh fowlers position; family @ bed side

## 2016-08-16 NOTE — Progress Notes (Signed)
PROGRESS NOTE    [name]  VOJ:[id number] DOB: [birth year] DOA: 08/12/2016 PCP: Maury Dus, MD    Brief Narrative:  61 year old with h/o DM, hypertension, chronic diastolic CHF, DVT, CAD, PVD, CVA with residual dysarthria coming in from SNF for persistent fevers. He was found to have bilateral heel ulcers , which have worsened over the last few weeks. This admission, ABI obtained showed severe / critical disease, vascular surgery consulted and transferred to [facility] for further work up/ intervention.    Assessment & Plan:   Active Problems:   Diabetes type 2, uncontrolled (HCC)   Hypertension   Chronic diastolic CHF (congestive heart failure) (HCC)   Peripheral neuropathy   Personal history of DVT (deep vein thrombosis)   History of DVT (deep vein thrombosis)   Coronary artery disease involving native coronary artery of native heart without angina pectoris   Cognitive and behavioral changes   Dysphagia due to recent stroke   AKI (acute kidney injury) ([place])   Pressure injury of skin   History of CVA (cerebrovascular accident)   Osteomyelitis of right foot ([place])   Enterococcal bacteremia   Bacteremia due to Escherichia coli   Proteus infection   Osteomyelitis of left foot (HCC)   Goals of care, counseling/discussion   Palliative care by specialist   Sepsis from osteomyelitis from bilateral heel ulcers / Gram negative bacteremia:   Currently on IV antibiotics, with IV vancomycin, rocephin and flagyl.  ID consulted and recommended repeating blood cultures.  Repeat blood cultures have been negative so far.  Patient scheduled for bilateral AKA later today. Heparin on hold for the procedure.  Meanwhile palliative care consulted for [place]. Plan to continue with aggressive management for now, and continue to follow up with palliative services as needed.    PAD:  See above. Resume statin.   CAD: no chest pain or sob. Has chronic dry cough, Resume coreg, statin.    H/o DVT: ON  heparin , pending surgery.    AKI:  Probably from sepsis and pre renal in etiology.  Normalized after fluids.    H/o Chronic diastolic heart failure:  He appears euvolemic.  Lasix held on admission for sepsis and AKI.  Resume on discharge.    Dysphagia:  Possibly from prior CVA.  SLP eval recommended Dysphagia 2 diet.   Hypertension:   sub optimally Controlled. Prn hydralazine added.   anemia of chronic disease/ Normocytic anemia:  Hemoglobin around 9.1 Transfuse if hemoglobin drops to less than 8.     Diabetes Mellitus:  Type 2. A1c 11.  CBG (last 3)   Recent Labs  08/15/16 2051 08/16/16 0621 08/16/16 1118  GLUCAP 314* 158* 124*    Better after adding the novolog 5 units TIDAC. Currently on 60 units of lantus daily. No changes to the regimen today.     H/o CVA:  With residual weakness.    DVT prophylaxis: heparin gtt.  Code Status: (Full) Family Communication: none at bedside.  Disposition Plan: pending eval and treatment by vascular, followed by PT eval.  Consultants:   Vascular surgery Dr Donzetta Matters.   Infectious disease Dr Megan Salon  Wound care  Palliative care consult.    Procedures: ABI   Antimicrobials: rocephin from 6/18, flagyl from 6/17, IV vancomycin from 6/17   Subjective: ROS No chest pain or sob. No nausea or vomiting. No abd pain  No headache or dizziness.  Worried about the procedure.    Objective: Vitals:   08/15/16 9937 08/15/16 1218  08/15/16 1954 08/16/16 0426  BP: (!) 159/75 (!) 146/72 (!) 160/74 (!) 158/60  Pulse: 80 80 82 78  Resp: 18 19 20 18   Temp: 98.7 F (37.1 C) 98.6 F (37 C) 99.2 F (37.3 C) 99.5 F (37.5 C)  TempSrc: Oral Oral Oral Oral  SpO2: 97% 100% 98% 95%  Weight: 101.3 kg (223 lb 4.8 oz)   103.2 kg (227 lb 8 oz)  Height:        Intake/Output Summary (Last 24 hours) at 08/16/16 1203 Last data filed at 08/16/16 0900  Gross per 24 hour  Intake              240 ml  Output             1800 ml  Net             -1560 ml   Filed Weights   08/14/16 0500 08/15/16 0332 08/16/16 0426  Weight: 100.7 kg (222 lb 0.1 oz) 101.3 kg (223 lb 4.8 oz) 103.2 kg (227 lb 8 oz)    Examination:  General exam: apprehensive about the procedure.  Respiratory system: air entry fair, diminished at bases.  Cardiovascular system: s1s2 , no murmer.  Gastrointestinal system:soft non tender non distended abd , no tenderness, bs+ Central nervous system: Alert and comfortable, slow speech,  Extremities: dressings over both the extremities, foul smelling with yellow drainage.  Skin: see above.  Psychiatry: flat affect,     Data Reviewed: I have personally reviewed following labs and imaging studies  CBC:  Recent Labs Lab 08/12/16 1724 08/13/16 0434 08/14/16 0514 08/15/16 0526 08/16/16 0358  WBC 8.7 9.6 9.6 7.5 9.7  NEUTROABS 6.9 6.6  --   --   --   HGB 9.8* 9.3* 9.8* 8.6* 9.1*  HCT 30.5* 29.5* 29.4* 28.3* 28.3*  MCV 84.5 83.6 83.8 87.6 86.5  PLT 263 240 282 263 962   Basic Metabolic Panel:  Recent Labs Lab 08/12/16 1724 08/13/16 0434 08/14/16 0514 08/15/16 0526 08/16/16 0358  NA 135 137 138 135 136  K 3.6 3.5 3.6 4.1 4.4  CL 100* 104 105 104 105  CO2 26 27 25 23 23   GLUCOSE 313* 227* 184* 305* 185*  BUN 22* 19 18 19 19   CREATININE 1.33* 1.10 0.97 1.13 0.99  CALCIUM 8.4* 8.1* 8.4* 8.2* 8.5*   GFR: Estimated Creatinine Clearance: 93.9 mL/min (by C-G formula based on SCr of 0.99 mg/dL). Liver Function Tests:  Recent Labs Lab 08/12/16 1724  AST 16  ALT 17  ALKPHOS 75  BILITOT 0.3  PROT 7.5  ALBUMIN 2.5*   No results for input(s): LIPASE, AMYLASE in the last 168 hours. No results for input(s): AMMONIA in the last 168 hours. Coagulation Profile:  Recent Labs Lab 08/13/16 1203 08/16/16 0358  INR 1.71 1.10   Cardiac Enzymes: No results for input(s): CKTOTAL, CKMB, CKMBINDEX, TROPONINI in the last 168 hours. BNP (last 3 results) No results for input(s): PROBNP in the last  8760 hours. HbA1C:  Recent Labs  08/15/16 1116  HGBA1C 8.3*   CBG:  Recent Labs Lab 08/15/16 1123 08/15/16 1639 08/15/16 2051 08/16/16 0621 08/16/16 1118  GLUCAP 227* 203* 314* 158* 124*   Lipid Profile: No results for input(s): CHOL, HDL, LDLCALC, TRIG, CHOLHDL, LDLDIRECT in the last 72 hours. Thyroid Function Tests: No results for input(s): TSH, T4TOTAL, FREET4, T3FREE, THYROIDAB in the last 72 hours. Anemia Panel:  Recent Labs  08/13/16 1504  VITAMINB12 416  FOLATE 20.4  FERRITIN 367*  TIBC 141*  IRON 10*  RETICCTPCT 0.6   Sepsis Labs:  Recent Labs Lab 08/12/16 1731  LATICACIDVEN 1.30    Recent Results (from the past 240 hour(s))  Blood culture (routine x 2)     Status: Abnormal   Collection Time: 08/12/16  5:29 PM  Result Value Ref Range Status   Specimen Description BLOOD LEFT ARM  Final   Special Requests BOTTLES DRAWN AEROBIC AND ANAEROBIC BCAV  Final   Culture  Setup Time   Final    GRAM NEGATIVE RODS GRAM POSITIVE COCCI IN PAIRS IN BOTH AEROBIC AND ANAEROBIC BOTTLES CRITICAL RESULT CALLED TO, READ BACK BY AND VERIFIED WITH: T GREEN,PHARMD AT 1151 08/13/16 BY L BENFIELD    Culture (A)  Final    ESCHERICHIA COLI ENTEROCOCCUS FAECALIS SUSCEPTIBILITIES PERFORMED ON PREVIOUS CULTURE WITHIN THE LAST 5 DAYS. Performed at [facility], [place] [address] [place], [place] [address]    Report Status 08/16/2016 FINAL  Final   Organism ID, Bacteria ESCHERICHIA COLI  Final      Susceptibility   Escherichia coli - MIC*    AMPICILLIN >=32 RESISTANT Resistant     CEFAZOLIN <=4 SENSITIVE Sensitive     CEFEPIME <=1 SENSITIVE Sensitive     CEFTAZIDIME <=1 SENSITIVE Sensitive     CEFTRIAXONE <=1 SENSITIVE Sensitive     CIPROFLOXACIN <=0.25 SENSITIVE Sensitive     GENTAMICIN <=1 SENSITIVE Sensitive     IMIPENEM <=0.25 SENSITIVE Sensitive     TRIMETH/SULFA >=320 RESISTANT Resistant     AMPICILLIN/SULBACTAM 8 SENSITIVE Sensitive     PIP/TAZO <=4  SENSITIVE Sensitive     Extended ESBL NEGATIVE Sensitive     * ESCHERICHIA COLI  Blood Culture ID Panel (Reflexed)     Status: Abnormal   Collection Time: 08/12/16  5:29 PM  Result Value Ref Range Status   Enterococcus species DETECTED (A) NOT DETECTED Final    Comment: CRITICAL RESULT CALLED TO, READ BACK BY AND VERIFIED WITH: T GREEN,PHARMD AT 1151 08/13/16 BY L BENFIELD    Vancomycin resistance NOT DETECTED NOT DETECTED Final   Listeria monocytogenes NOT DETECTED NOT DETECTED Final   Staphylococcus species NOT DETECTED NOT DETECTED Final   Staphylococcus aureus NOT DETECTED NOT DETECTED Final   Streptococcus species NOT DETECTED NOT DETECTED Final   Streptococcus agalactiae NOT DETECTED NOT DETECTED Final   Streptococcus pneumoniae NOT DETECTED NOT DETECTED Final   Streptococcus pyogenes NOT DETECTED NOT DETECTED Final   Acinetobacter baumannii NOT DETECTED NOT DETECTED Final   Enterobacteriaceae species DETECTED (A) NOT DETECTED Final    Comment: CRITICAL RESULT CALLED TO, READ BACK BY AND VERIFIED WITH: T GREEN,PHARMD AT 1151 08/13/16 BY L BENFIELD    Enterobacter cloacae complex NOT DETECTED NOT DETECTED Final   Escherichia coli DETECTED (A) NOT DETECTED Final    Comment: CRITICAL RESULT CALLED TO, READ BACK BY AND VERIFIED WITH: T GREEN,PHARMD AT 1151 08/13/16 BY L BENFIELD    Klebsiella oxytoca NOT DETECTED NOT DETECTED Final   Klebsiella pneumoniae NOT DETECTED NOT DETECTED Final   Proteus species DETECTED (A) NOT DETECTED Final    Comment: CRITICAL RESULT CALLED TO, READ BACK BY AND VERIFIED WITH: T GREEN,PHARMD AT 1151 08/13/16 BY L BENFIELD    Serratia marcescens NOT DETECTED NOT DETECTED Final   Carbapenem resistance NOT DETECTED NOT DETECTED Final   Haemophilus influenzae NOT DETECTED NOT DETECTED Final   Neisseria meningitidis NOT DETECTED NOT DETECTED Final   Pseudomonas aeruginosa NOT DETECTED  NOT DETECTED Final   Candida albicans NOT DETECTED NOT DETECTED Final    Candida glabrata NOT DETECTED NOT DETECTED Final   Candida krusei NOT DETECTED NOT DETECTED Final   Candida parapsilosis NOT DETECTED NOT DETECTED Final   Candida tropicalis NOT DETECTED NOT DETECTED Final    Comment: Performed at [facility], [place] [address], [place], [place] [address]  Blood culture (routine x 2)     Status: Abnormal   Collection Time: 08/12/16  5:53 PM  Result Value Ref Range Status   Specimen Description BLOOD RIGHT HAND  Final   Special Requests   Final    BOTTLES DRAWN AEROBIC AND ANAEROBIC Blood Culture adequate volume   Culture  Setup Time   Final    GRAM NEGATIVE RODS IN BOTH AEROBIC AND ANAEROBIC BOTTLES CRITICAL RESULT CALLED TO, READ BACK BY AND VERIFIED WITH: E WILLIAMSON,PHARMD 1327 08/13/16 BY L BENFIELD GRAM POSITIVE COCCI IN PAIRS AEROBIC BOTTLE ONLY Performed at [facility], [place] [address] [place], [place] [address]    Culture PROTEUS MIRABILIS ENTEROCOCCUS FAECALIS  (A)  Final   Report Status 08/16/2016 FINAL  Final   Organism ID, Bacteria PROTEUS MIRABILIS  Final   Organism ID, Bacteria ENTEROCOCCUS FAECALIS  Final      Susceptibility   Enterococcus faecalis - MIC*    AMPICILLIN <=2 SENSITIVE Sensitive     VANCOMYCIN 1 SENSITIVE Sensitive     GENTAMICIN SYNERGY RESISTANT Resistant     * ENTEROCOCCUS FAECALIS   Proteus mirabilis - MIC*    AMPICILLIN <=2 SENSITIVE Sensitive     CEFAZOLIN <=4 SENSITIVE Sensitive     CEFEPIME <=1 SENSITIVE Sensitive     CEFTAZIDIME <=1 SENSITIVE Sensitive     CEFTRIAXONE <=1 SENSITIVE Sensitive     CIPROFLOXACIN <=0.25 SENSITIVE Sensitive     GENTAMICIN <=1 SENSITIVE Sensitive     IMIPENEM 2 SENSITIVE Sensitive     TRIMETH/SULFA <=20 SENSITIVE Sensitive     AMPICILLIN/SULBACTAM <=2 SENSITIVE Sensitive     PIP/TAZO <=4 SENSITIVE Sensitive     * PROTEUS MIRABILIS  MRSA PCR Screening     Status: None   Collection Time: 08/13/16  5:50 AM  Result Value Ref Range Status   MRSA by PCR NEGATIVE  NEGATIVE Final    Comment:        The GeneXpert MRSA Assay (FDA approved for NASAL specimens only), is one component of a comprehensive MRSA colonization surveillance program. It is not intended to diagnose MRSA infection nor to guide or monitor treatment for MRSA infections.   Culture, blood (routine x 2)     Status: None (Preliminary result)   Collection Time: 08/14/16  5:14 AM  Result Value Ref Range Status   Specimen Description BLOOD RIGHT HAND  Final   Special Requests IN PEDIATRIC BOTTLE Blood Culture adequate volume  Final   Culture   Final    NO GROWTH 1 DAY Performed at [facility], [place] [address] [place], [place] [address]    Report Status PENDING  Incomplete  Culture, blood (routine x 2)     Status: None (Preliminary result)   Collection Time: 08/14/16  5:44 AM  Result Value Ref Range Status   Specimen Description BLOOD RIGHT HAND  Final   Special Requests   Final    BOTTLES DRAWN AEROBIC ONLY Blood Culture adequate volume   Culture   Final    NO GROWTH 1 DAY Performed at [facility], [address] [place], [place] [address]    Report Status PENDING  Incomplete         Radiology Studies: No results found.      Scheduled Meds: . [MAR Hold] amLODipine  10 mg Oral Daily  . [MAR Hold] atorvastatin  40 mg Oral q1800  . [MAR Hold] bethanechol  10 mg Oral TID  . [MAR Hold] carvedilol  25 mg Oral BID WC  . [MAR Hold] cloNIDine  0.3 mg Oral TID  . [MAR Hold] feeding supplement (GLUCERNA SHAKE)  237 mL Oral QID  . [MAR Hold] feeding supplement (PRO-STAT SUGAR FREE 64)  30 mL Oral BID  . [MAR Hold] FLUoxetine  10 mg Oral Daily  . [MAR Hold] insulin aspart  0-15 Units Subcutaneous TID WC  . [MAR Hold] insulin aspart  0-5 Units Subcutaneous QHS  . [MAR Hold] insulin aspart  5 Units Subcutaneous TID WC  . [MAR Hold] insulin glargine  60 Units Subcutaneous QHS  . [MAR Hold] isosorbide-hydrALAZINE  2 tablet Oral TID  . [MAR Hold] mirtazapine   7.5 mg Oral QHS  . [MAR Hold] polyethylene glycol  17 g Oral Daily  . [MAR Hold] QUEtiapine  50 mg Oral Daily  . [MAR Hold] senna-docusate  1 tablet Oral Daily  . [MAR Hold] tamsulosin  0.4 mg Oral QPC supper  . [MAR Hold] zinc sulfate  220 mg Oral Daily   Continuous Infusions: . [MAR Hold] cefTRIAXone (ROCEPHIN)  IV Stopped (08/16/16 0302)  . [MAR Hold] cefUROXime (ZINACEF)  IV    . heparin Stopped (08/16/16 1031)  . [MAR Hold] metronidazole 500 mg (08/16/16 0941)  . [MAR Hold] vancomycin Stopped (08/16/16 0943)     LOS: 4 days    Time spent: 40 MINUTES.     Hosie Poisson, MD [facility] Pager [phone]  If 7PM-7AM, please contact night-coverage [email or web address] Password [facility] 08/16/2016, 12:03 PM

## 2016-08-16 NOTE — Transfer of Care (Signed)
Immediate Anesthesia Transfer of Care Note  Patient: [name]  Procedure(s) Performed: Procedure(s): BILATERAL AMPUTATION ABOVE KNEE (Bilateral)  Patient Location: PACU  Anesthesia Type:General  Level of Consciousness: drowsy  Airway & Oxygen Therapy: Patient Spontanous Breathing and Patient connected to nasal cannula oxygen  Post-op Assessment: Report given to RN and Post -op Vital signs reviewed and stable  Post vital signs: Reviewed and stable  Last Vitals:  Vitals:   08/15/16 1954 08/16/16 0426  BP: (!) 160/74 (!) 158/60  Pulse: 82 78  Resp: 20 18  Temp: 37.3 C 37.5 C    Last Pain:  Vitals:   08/16/16 0426  TempSrc: Oral  PainSc:          Complications: No apparent anesthesia complications

## 2016-08-16 NOTE — Progress Notes (Signed)
Pharmacy Antibiotic Note  [name] is a 61 y.o. male admitted on 08/12/2016 with polymicrobial bacteremia, bilateral osteomyelitis.  Pharmacy has been consulted for vancomycin dosing - day #5. Also on ceftriaxone and flagyl per MD. Bilateral AKAs scheduled for 6/21. Per ID, will need at least 2wks of antibiotics, likely narrow to Zosyn soon. Afebrile, WBC wnl. SCr back down to 0.99.  Vancomycin dose was increased to 1g IV q12h on 6/19 for improved renal function.  Plan: Vancomycin 1gm IV q12h Ceftriaxone 2gm IV q24h per MD Flagyl IV per MD Monitor clinical progress, c/s, renal function F/u de-escalation plan/LOT Vancomycin trough as indicated - soon if continuing  Height: 5\' 9"  (175.3 cm) Weight: 227 lb 8 oz (103.2 kg) IBW/kg (Calculated) : 70.7  Temp (24hrs), Avg:99.1 F (37.3 C), Min:98.6 F (37 C), Max:99.5 F (37.5 C)   Recent Labs Lab 08/12/16 1724 08/12/16 1731 08/13/16 0434 08/14/16 0514 08/15/16 0526 08/16/16 0358  WBC 8.7  --  9.6 9.6 7.5 9.7  CREATININE 1.33*  --  1.10 0.97 1.13 0.99  LATICACIDVEN  --  1.30  --   --   --   --     Estimated Creatinine Clearance: 93.9 mL/min (by C-G formula based on SCr of 0.99 mg/dL).    Allergies  Allergen Reactions  . Iohexol Hives  . Ivp Dye [Iodinated Diagnostic Agents] Hives    Antimicrobials this admission: 6/17 vancomycin>> 6/17 zosynx 1 6/17 Ceftriaxone >> 6/17 metronidazole >>  Dose adjustments this admission: 6/19 vanco to 1gm q12h for improved renal fx  Microbiology results: 6/17BCx: 2/2 Proteus, GPC (BCID - e. Coli, proteus and enterococcus) 6/17 MRSA PCR: neg 6/19 BCx: ngtd   Elicia Lamp, PharmD, BCPS Clinical Pharmacist Rx Phone # for today: [phone] After 3:30PM, please call Main Rx: [phone] 08/16/2016 9:39 AM

## 2016-08-16 NOTE — Interval H&P Note (Signed)
History and Physical Interval Note:  08/16/2016 12:48 PM  [name]  has presented today for surgery, with the diagnosis of BILATERAL ISCHEMIA  The various methods of treatment have been discussed with the patient and family. After consideration of risks, benefits and other options for treatment, the patient has consented to  Procedure(s): AMPUTATION ABOVE KNEE (Bilateral) as a surgical intervention .  The patient's history has been reviewed, patient examined, no change in status, stable for surgery.  I have reviewed the patient's chart and labs.  Questions were answered to the patient's satisfaction.     [name]

## 2016-08-16 NOTE — Progress Notes (Addendum)
Assumed care from off going RN @ 0700; patient resting and sleeping; denies pain; patient is NPO, @1030  heparin drip stopped per verbal order, patient headed to OR via bed on tele.   @1050  gave report to short stay nurse

## 2016-08-17 ENCOUNTER — Encounter (HOSPITAL_COMMUNITY): Payer: Self-pay | Admitting: Surgery

## 2016-08-17 ENCOUNTER — Inpatient Hospital Stay (HOSPITAL_COMMUNITY): Payer: Medicare Other

## 2016-08-17 DIAGNOSIS — Z89611 Acquired absence of right leg above knee: Secondary | ICD-10-CM

## 2016-08-17 DIAGNOSIS — Z89612 Acquired absence of left leg above knee: Secondary | ICD-10-CM

## 2016-08-17 DIAGNOSIS — R4182 Altered mental status, unspecified: Secondary | ICD-10-CM

## 2016-08-17 LAB — GLUCOSE, CAPILLARY
GLUCOSE-CAPILLARY: 197 mg/dL — AB (ref 65–99)
GLUCOSE-CAPILLARY: 96 mg/dL (ref 65–99)
Glucose-Capillary: 176 mg/dL — ABNORMAL HIGH (ref 65–99)
Glucose-Capillary: 133 mg/dL — ABNORMAL HIGH (ref 65–99)
Glucose-Capillary: 74 mg/dL (ref 65–99)

## 2016-08-17 LAB — CBC
HCT: 26.9 % — ABNORMAL LOW (ref 39.0–52.0)
Hemoglobin: 8.3 g/dL — ABNORMAL LOW (ref 13.0–17.0)
MCH: 26.5 pg (ref 26.0–34.0)
MCHC: 30.9 g/dL (ref 30.0–36.0)
MCV: 85.9 fL (ref 78.0–100.0)
PLATELETS: 311 10*3/uL (ref 150–400)
RBC: 3.13 MIL/uL — ABNORMAL LOW (ref 4.22–5.81)
RDW: 14 % (ref 11.5–15.5)
WBC: 13.7 10*3/uL — ABNORMAL HIGH (ref 4.0–10.5)

## 2016-08-17 LAB — SEDIMENTATION RATE: SED RATE: 138 mm/h — AB (ref 0–16)

## 2016-08-17 LAB — BASIC METABOLIC PANEL
Anion gap: 7 (ref 5–15)
BUN: 15 mg/dL (ref 6–20)
CHLORIDE: 105 mmol/L (ref 101–111)
CO2: 25 mmol/L (ref 22–32)
Calcium: 8 mg/dL — ABNORMAL LOW (ref 8.9–10.3)
Creatinine, Ser: 1.12 mg/dL (ref 0.61–1.24)
GFR calc Af Amer: >60 mL/min (ref >60)
GFR calc non Af Amer: >60 mL/min (ref >60)
Glucose, Bld: 203 mg/dL — ABNORMAL HIGH (ref 65–99)
POTASSIUM: 4.5 mmol/L (ref 3.5–5.1)
SODIUM: 137 mmol/L (ref 135–145)

## 2016-08-17 LAB — HEPARIN LEVEL (UNFRACTIONATED): HEPARIN UNFRACTIONATED: 0.18 [IU]/mL — AB (ref 0.30–0.70)

## 2016-08-17 LAB — C-REACTIVE PROTEIN: CRP: 8.2 mg/dL — AB (ref <1.0)

## 2016-08-17 MED ORDER — SODIUM CHLORIDE 0.9 % IV SOLN
3.0000 g | Freq: Four times a day (QID) | INTRAVENOUS | Status: DC
  Administered 2016-08-17 – 2016-08-27 (×36): 3 g via INTRAVENOUS
  Filled 2016-08-17 (×41): qty 3

## 2016-08-17 MED ORDER — NALOXONE HCL 0.4 MG/ML IJ SOLN
0.4000 mg | INTRAMUSCULAR | Status: DC | PRN
  Administered 2016-08-17: 0.4 mg via INTRAVENOUS

## 2016-08-17 MED ORDER — NALOXONE HCL 0.4 MG/ML IJ SOLN
INTRAMUSCULAR | Status: AC
  Administered 2016-08-17: 0.4 mg via INTRAVENOUS
  Filled 2016-08-17: qty 1

## 2016-08-17 NOTE — Evaluation (Signed)
Physical Therapy Evaluation Patient Details Name: [name] MRN: [id number] DOB: [birth year] Today's Date: 08/17/2016   History of Present Illness  Patient is a 61 yo male admitted 08/12/16 for B AKA due to non healing B heel wounds and ischemia. PMH:  blind Lt eye, CVA with Rt hemi, CHF, diabetes, herpes, hypertension, heart attack, peripheral neuropathy   Clinical Impression  Patient presents with problems listed below.  Patient will benefit from acute PT to maximize functional mobility prior to discharge back to SNF.    Follow Up Recommendations SNF    Equipment Recommendations  Other (comment) (TBD at next venue of care)    Recommendations for Other Services       Precautions / Restrictions Precautions Precautions: Fall      Mobility  Bed Mobility Overal bed mobility: Needs Assistance Bed Mobility: Supine to Sit;Rolling Rolling: +2 for physical assistance;Total assist   Supine to sit: +2 for physical assistance;Total assist     General bed mobility comments: Verbal and tactile cues to use LUE to assist with rolling.  Able to initiate, however required +2 total assist for rolling to both sides.  Unable to use RUE to assist.  Patient required +2 total assist to move to long-sitting in bed.  Unable to reach fully upright position - patient pushing posteriorly and c/o "stomach" pain.  Attempted x2.  Transfers                 General transfer comment: Recommend lift equipment  Ambulation/Gait                Stairs            Wheelchair Mobility    Modified Rankin (Stroke Patients Only)       Balance Overall balance assessment: Needs assistance Sitting-balance support: Single extremity supported Sitting balance-Leahy Scale: Zero Sitting balance - Comments: heavy posterior lean Postural control: Posterior lean                                   Pertinent Vitals/Pain Pain Assessment: Faces Faces Pain Scale: Hurts little  more Pain Location: "stomach" Pain Descriptors / Indicators: Grimacing Pain Intervention(s): Monitored during session;Repositioned    Home Living Family/patient expects to be discharged to:: Skilled nursing facility                      Prior Function Level of Independence: Needs assistance   Gait / Transfers Assistance Needed: bed bound  ADL's / Homemaking Assistance Needed: Max-total assist for ADL's        Hand Dominance   Dominant Hand: Right    Extremity/Trunk Assessment   Upper Extremity Assessment Upper Extremity Assessment: Defer to OT evaluation RUE Deficits / Details: residual weakness on R UE from prior CVA, no functional use observed RUE Coordination: decreased fine motor;decreased gross motor LUE Deficits / Details: 3-/5 shoulder, elbow, 4+/5 hand    Lower Extremity Assessment Lower Extremity Assessment: RLE deficits/detail;LLE deficits/detail RLE Deficits / Details: new AKA, hip flexion 3-/5 LLE Deficits / Details: new AKA, hip flexion 3/5       Communication   Communication: Expressive difficulties (Minimal verbalizations)  Cognition Arousal/Alertness: Lethargic Behavior During Therapy: Flat affect Overall Cognitive Status: No family/caregiver present to determine baseline cognitive functioning  General Comments: pt with reliable yes/no, answering questions with one word answers inconsistently, disoriented except to self      General Comments General comments (skin integrity, edema, etc.): Elevated and applied ice to bilateral residual limbs.    Exercises     Assessment/Plan    PT Assessment Patient needs continued PT services  PT Problem List Decreased strength;Decreased activity tolerance;Decreased balance;Decreased mobility;Decreased cognition;Decreased knowledge of use of DME;Obesity;Decreased skin integrity;Pain       PT Treatment Interventions Functional mobility training;Therapeutic  activities;Therapeutic exercise;Balance training;Cognitive remediation;Patient/family education    PT Goals (Current goals can be found in the Care Plan section)  Acute Rehab PT Goals Patient Stated Goal: None stated PT Goal Formulation: With patient Time For Goal Achievement: 08/31/16 Potential to Achieve Goals: Fair    Frequency Min 2X/week   Barriers to discharge        Co-evaluation PT/OT/SLP Co-Evaluation/Treatment: Yes Reason for Co-Treatment: Complexity of the patient's impairments (multi-system involvement);For patient/therapist safety PT goals addressed during session: Mobility/safety with mobility OT goals addressed during session: Strengthening/ROM;ADL's and self-care       AM-PAC PT "6 Clicks" Daily Activity  Outcome Measure Difficulty turning over in bed (including adjusting bedclothes, sheets and blankets)?: Total Difficulty moving from lying on back to sitting on the side of the bed? : Total Difficulty sitting down on and standing up from a chair with arms (e.g., wheelchair, bedside commode, etc,.)?: Total Help needed moving to and from a bed to chair (including a wheelchair)?: Total Help needed walking in hospital room?: Total Help needed climbing 3-5 steps with a railing? : Total 6 Click Score: 6    End of Session Equipment Utilized During Treatment: Oxygen Activity Tolerance: Patient limited by lethargy;Patient limited by pain Patient left: in bed;with call bell/phone within reach;with bed alarm set Nurse Communication: Need for lift equipment PT Visit Diagnosis: Muscle weakness (generalized) (M62.81);Pain Pain - part of body:  (stomach)    Time: 8159-4707 PT Time Calculation (min) (ACUTE ONLY): 17 min   Charges:   PT Evaluation $PT Eval Moderate Complexity: 1 Procedure     PT G Codes:       Carita Pian. Sanjuana Kava, [facility] Acute Rehab Services Pager [place] 08/17/2016, 1:28 PM

## 2016-08-17 NOTE — Evaluation (Signed)
Occupational Therapy Evaluation and Discharge Patient Details Name: [name] MRN: [id number] DOB: 1955/10/25 Today's Date: 08/17/2016    History of Present Illness Patient is a 61 yo male admitted 08/12/16 for B AKA due to non healing B heel wounds and ischemia. PMH:  blind Lt eye, CVA with Rt hemi, CHF, diabetes, herpes, hypertension, heart attack, peripheral neuropathy    Clinical Impression   Pt with baseline dependence in ADL and mobility. Currently requiring 2 person assist for bed level mobility and demonstrates zero sitting balance with heavy posterior lean. No further OT needs.    Follow Up Recommendations  No OT follow up (return to SNF)    Equipment Recommendations       Recommendations for Other Services       Precautions / Restrictions Precautions Precautions: Fall Restrictions Weight Bearing Restrictions: Yes RLE Weight Bearing: Non weight bearing LLE Weight Bearing: Non weight bearing      Mobility Bed Mobility Overal bed mobility: Needs Assistance Bed Mobility: Supine to Sit;Rolling Rolling: +2 for physical assistance;Total assist   Supine to sit: +2 for physical assistance;Total assist        Transfers                 General transfer comment: pt likely to require lift    Balance Overall balance assessment: Needs assistance   Sitting balance-Leahy Scale: Zero Sitting balance - Comments: heavy posterior lean                                   ADL either performed or assessed with clinical judgement   ADL Overall ADL's : At baseline                                       General ADL Comments: Pt reports dependence in all ADL.     Vision Baseline Vision/History:  (blind in L eye) Patient Visual Report: No change from baseline       Perception     Praxis      Pertinent Vitals/Pain Pain Assessment: Faces Faces Pain Scale: Hurts little more Pain Location: "stomach" Pain Descriptors /  Indicators: Grimacing Pain Intervention(s): Monitored during session;Repositioned     Hand Dominance Right   Extremity/Trunk Assessment Upper Extremity Assessment Upper Extremity Assessment: RUE deficits/detail;LUE deficits/detail RUE Deficits / Details: residual weakness on R UE from prior CVA, no functional use observed RUE Coordination: decreased fine motor;decreased gross motor LUE Deficits / Details: 3-/5 shoulder, elbow, 4+/5 hand   Lower Extremity Assessment Lower Extremity Assessment:  (B AKA)       Communication Communication Communication: Expressive difficulties (minimal verbalization)   Cognition Arousal/Alertness: Lethargic Behavior During Therapy: Flat affect Overall Cognitive Status: No family/caregiver present to determine baseline cognitive functioning                                 General Comments: pt with reliable yes/no, answering questions with one word answers inconsistently, disoriented except to self   General Comments       Exercises     Shoulder Instructions      Home Living Family/patient expects to be discharged to:: Skilled nursing facility  Prior Functioning/Environment Level of Independence: Needs assistance  Gait / Transfers Assistance Needed: bed bound ADL's / Homemaking Assistance Needed: Max-total assist for ADL's            OT Problem List:        OT Treatment/Interventions:      OT Goals(Current goals can be found in the care plan section)    OT Frequency:     Barriers to D/C:            Co-evaluation PT/OT/SLP Co-Evaluation/Treatment: Yes Reason for Co-Treatment: Complexity of the patient's impairments (multi-system involvement);For patient/therapist safety   OT goals addressed during session: Strengthening/ROM;ADL's and self-care      AM-PAC PT "6 Clicks" Daily Activity     Outcome Measure Help from another person eating meals?:  Total Help from another person taking care of personal grooming?: Total Help from another person toileting, which includes using toliet, bedpan, or urinal?: Total Help from another person bathing (including washing, rinsing, drying)?: Total Help from another person to put on and taking off regular upper body clothing?: Total Help from another person to put on and taking off regular lower body clothing?: Total 6 Click Score: 6   End of Session Equipment Utilized During Treatment: Oxygen  Activity Tolerance: Patient tolerated treatment well Patient left: in bed;with call bell/phone within reach;with bed alarm set                   Time: 5830-9407 OT Time Calculation (min): 17 min Charges:  OT General Charges $OT Visit: 1 Procedure OT Evaluation $OT Eval Moderate Complexity: 1 Procedure G-Codes:      Malka So 08/17/2016, 12:47 PM  [phone]

## 2016-08-17 NOTE — Progress Notes (Signed)
Daily Progress Note   Patient Name: [name]       Date: 08/17/2016 DOB: Feb 04, 1956  Age: 61 y.o. MRN#: [id number] Attending Physician: Hosie Poisson, MD Primary Care Physician: Maury Dus, MD Admit Date: 08/12/2016  Reason for Consultation/Follow-up: Establishing goals of care and Psychosocial/spiritual support  Subjective: [name] was sleeping on my arrival to his room. His daughter, [name], was at the bedside. She relates that he has been exhausted since surgery yesterday and asked that I not wake him. Her main concern is ensuring adequate pain management. She is otherwise without questions.  Length of Stay: 5  Current Medications: Scheduled Meds:  . amLODipine  10 mg Oral Daily  . atorvastatin  40 mg Oral q1800  . bethanechol  10 mg Oral TID  . carvedilol  25 mg Oral BID WC  . cloNIDine  0.3 mg Oral TID  . docusate sodium  100 mg Oral Daily  . feeding supplement (GLUCERNA SHAKE)  237 mL Oral QID  . feeding supplement (PRO-STAT SUGAR FREE 64)  30 mL Oral BID  . FLUoxetine  10 mg Oral Daily  . insulin aspart  0-15 Units Subcutaneous TID WC  . insulin aspart  0-5 Units Subcutaneous QHS  . insulin aspart  5 Units Subcutaneous TID WC  . insulin glargine  60 Units Subcutaneous QHS  . isosorbide-hydrALAZINE  2 tablet Oral TID  . mirtazapine  7.5 mg Oral QHS  . pantoprazole  40 mg Oral Daily  . polyethylene glycol  17 g Oral Daily  . QUEtiapine  50 mg Oral Daily  . senna-docusate  1 tablet Oral Daily  . tamsulosin  0.4 mg Oral QPC supper  . zinc sulfate  220 mg Oral Daily    Continuous Infusions: . sodium chloride    . sodium chloride 125 mL/hr at 08/17/16 0501  . ampicillin-sulbactam (UNASYN) IV    . heparin 1,600 Units/hr (08/17/16 0908)  . magnesium sulfate 1 - 4 g bolus IVPB      PRN Meds: acetaminophen **OR**  acetaminophen, albuterol, alum & mag hydroxide-simeth, bisacodyl, guaiFENesin-dextromethorphan, hydrALAZINE, hydrocortisone, HYDROmorphone (DILAUDID) injection, ketorolac, labetalol, lidocaine, magnesium sulfate 1 - 4 g bolus IVPB, metoprolol tartrate, naloxone, ondansetron **OR** ondansetron (ZOFRAN) IV, oxyCODONE-acetaminophen, phenol, potassium chloride, sodium phosphate  Physical Exam      Constitutional: He appears well-developed and well-nourished.  Asleep. HENT:  Head: Normocephalic and atraumatic.  Mouth/Throat: Oropharynx is clear. Abnormal dentition. No oropharyngeal exudate.  Mouth dry. Eyes:  Closed Cardiovascular: Normal rate and regular rhythm.   Pulmonary/Chest: Effort normal and breath sounds normal. No respiratory distress.  Abdominal: Soft. Bowel sounds are normal.  Musculoskeletal:  UTA ROM/strength as pt asleep. Bilateral AKA, dressing in place with serosanguinous drainage noted. Post-op day #1. Neurological:  Asleep. Skin: Skin is warm and dry.  [place] AKA.  Psychiatric:  UTA, pt asleep. Appears calm in bed.  Vital Signs: BP (!) 131/53   Pulse 95   Temp 98.9 F (37.2 C) (Axillary)   Resp 16   Ht 5\' 9"  (1.753 m)   Wt 97.4 kg (214 lb 12.8 oz)   SpO2 100%   BMI 31.72 kg/m  SpO2: SpO2: 100 % O2 Device: O2 Device:  Nasal Cannula O2 Flow Rate: O2 Flow Rate (L/min): 2 L/min  Intake/output summary:   Intake/Output Summary (Last 24 hours) at 08/17/16 1226 Last data filed at 08/17/16 0630  Gross per 24 hour  Intake          3812.08 ml  Output             2975 ml  Net           837.08 ml   LBM: Last BM Date: 08/16/16 Baseline Weight: Weight: 104.3 kg (230 lb) Most recent weight: Weight: 97.4 kg (214 lb 12.8 oz)  Palliative Assessment/Data: PPS 40-50%   Flowsheet Rows     Most Recent Value  Intake Tab  Referral Department  Hospitalist  Unit at Time of Referral  Med/Surg Unit  Palliative Care Primary Diagnosis  Sepsis/Infectious Disease  Date  Notified  08/14/16  Palliative Care Type  New Palliative care  Reason for referral  Clarify Goals of Care  Date of Admission  08/12/16  Date first seen by Palliative Care  08/14/16  # of days Palliative referral response time  0 Day(s)  # of days IP prior to Palliative referral  2  Clinical Assessment  Psychosocial & Spiritual Assessment  Palliative Care Outcomes      Patient Active Problem List   Diagnosis Date Noted  . Goals of care, counseling/discussion   . Palliative care by specialist   . Enterococcal bacteremia 08/13/2016  . Bacteremia due to Escherichia coli 08/13/2016  . Proteus infection 08/13/2016  . Osteomyelitis of left foot ([place]) 08/13/2016  . History of CVA (cerebrovascular accident) 08/12/2016  . Osteomyelitis of right foot ([place]) 08/12/2016  . HLD (hyperlipidemia) 05/15/2016  . AKI (acute kidney injury) ([place]) 05/10/2016  . Pressure injury of skin 05/10/2016  . Dysphagia due to recent stroke 04/28/2016  . Hyperlipidemia associated with type 2 diabetes mellitus ([place]) 04/28/2016  . Depression 04/28/2016  . Cognitive and behavioral changes   . Cerebrovascular accident (CVA) due to embolism of left posterior cerebral artery ([place])   . Renovascular hypertension   . History of DVT (deep vein thrombosis)   . Coronary artery disease involving native coronary artery of native heart without angina pectoris   . Morbid obesity ([place])   . Normocytic anemia   . Embolic stroke involving posterior cerebral artery ([place]) 04/03/2016  . Brainstem stroke ([place]) 04/01/2016  . Abnormality of gait and mobility   . Cerebral infarction due to thrombosis of other cerebral artery ([place])   . Right arm numbness   . Left sided numbness 03/29/2016  . TIA (transient ischemic attack) 03/29/2016  . Hypertensive urgency 03/29/2016  . Sepsis ([place]) 09/10/2013  . Personal history of DVT (deep vein thrombosis) 01/01/2013  . Diabetes type 2, uncontrolled ([place]) 12/29/2012  . Hypertension   .  Cardiomyopathy ([place])   . Chronic diastolic CHF (congestive heart failure) ([place])   . Peripheral neuropathy   . Special screening for malignant neoplasms, colon 12/09/2012    Palliative Care Assessment & Plan   HPI: 61 y.o. male  with past medical history of DM2, HT N, dCHF, CAD s/p stenting, PAD, prior DVT on anticoagulation, depression, and prior CVA with dysarthria. He has been followed in the outpatient setting for PAD with non-healing wounds on both feet. He then presented to the ED from SNF with fever. He was admitted on 08/12/2016. Work-up revealed sepsis secondary to osteomyelitis of feet. Vascular and ID following. Palliative consulted to assist in goals of care clarification.  Assessment: After multiple conversations with Mr. Gauthreaux and his family, they ultimately made the decision to proceed with amputation. I did have a serious discussion with them about the potential complications with this option, which included the risk of the surgery itself given his cardiac issues, the high potential for post-surgical complications including delirium, and the significant debility that comes from the loss of both legs above the knee. In considering these things, I emphasized the importance of considering quality of life and the potential for prolonged suffering. They were receptive to this information, but felt it was the only option that would prolong his life. [name] also felt that they could work towards improving quality of life over time. I shared this information with Dr. Trula Slade (Vascular).   Mr. Mucci is now post-op day #1 after bilateral AKA. He was sound asslep when I entered the room, and his daughter [name] at his bedside asked that I not wake him. I was able to examine him without rousing him. Brandi and I talked about the surgery itself and the expected post-surgical course, which included timeline for the first dressing change and long-term rehab needs. We also talked through some of the  potential complications we are monitoring for, including infection and delirium. [name] was appreciative of this information and planned to relay it to her other family members.   Recommendations/Plan:  Full code, full scope treatment  Post-op pain per Vascular  *Palliative will continue to follow Mr. Kollmann peripherally at this point. I will be off this weekend, but will return on Monday and check-in with Mr. Theil and his family. If something is needed over the weekend, please call the Palliative Care team phone at [phone].   Code Status:  Full code  Prognosis:   Unable to determine  Discharge Planning:  [place] for rehab with Palliative care service follow-up  Care plan was discussed with pt's daughter, [name].  Thank you for allowing the Palliative Medicine Team to assist in the care of this patient.  Total time: 25 minutes    Greater than 50%  of this time was spent counseling and coordinating care related to the above assessment and plan.  Charlynn Court, NP Palliative Medicine Team [phone] pager (7a-5p) Team Phone # [phone]

## 2016-08-17 NOTE — Progress Notes (Addendum)
Thank you for consult on Mr. [name] reviewed and note that patient from SNF. Will defier CIR consult and recommend follow up therapy at SNF after discharge.

## 2016-08-17 NOTE — Progress Notes (Signed)
Called on call MD concerning pt's pain level and elevated B/P. Orders given to d/c morphine and vicodin. Add 0.5-2mg  dilaudid q2; toradol 30 q6prn. Patient given 2mg  dilaudid and toradol.

## 2016-08-17 NOTE — Progress Notes (Signed)
PROGRESS NOTE    [name]  QQP:[id number] DOB: [birth year] DOA: 08/12/2016 PCP: Maury Dus, MD    Brief Narrative:  61 year old with h/o DM, hypertension, chronic diastolic CHF, DVT, CAD, PVD, CVA with residual dysarthria coming in from SNF for persistent fevers. He was found to have bilateral heel ulcers , which have worsened over the last few weeks. This admission, ABI obtained showed severe / critical disease, vascular surgery consulted and transferred to [facility] for further work up/ intervention. Patient underwent bilateral AKA on 6/21. On the early hours of 6/22, pt had an episode of altered mental status possibly form oversedation from pain meds. CT head was negative for acute stroke.  This am patient is lethargic but able to answer simple questions.   Assessment & Plan:   Active Problems:   Diabetes type 2, uncontrolled (HCC)   Hypertension   Chronic diastolic CHF (congestive heart failure) (HCC)   Peripheral neuropathy   Personal history of DVT (deep vein thrombosis)   History of DVT (deep vein thrombosis)   Coronary artery disease involving native coronary artery of native heart without angina pectoris   Cognitive and behavioral changes   Dysphagia due to recent stroke   AKI (acute kidney injury) ([place])   Pressure injury of skin   History of CVA (cerebrovascular accident)   Osteomyelitis of right foot ([place])   Enterococcal bacteremia   Bacteremia due to Escherichia coli   Proteus infection   Osteomyelitis of left foot (HCC)   Goals of care, counseling/discussion   Palliative care by specialist   Status post bilateral above knee amputation ([place])   Sepsis from osteomyelitis from bilateral heel ulcers / Gram negative bacteremia:  Resolved.  D/C  IV vancomycin, rocephin and flagyl, as pt had bilateral AKA, currently on unasyn till 7/2 to complete the course.  ID consulted and recommended given. .  Repeat blood cultures have been negative so far.  Bilateral AKA on 6/21.    Pain control.  Dressing change tomorrow.     PAD:  See above. Resume statin.   CAD: no new complaints.  Resume coreg, statin.    H/o DVT:  Was on xarelto, which was changed to heparin for surgery.  Plan to switch back to xarelto on discharge.    AKI:  Probably from sepsis and pre renal in etiology.  Normalized after fluids.  Creatinine around 1.   H/o Chronic diastolic heart failure:  He appears euvolemic.  Lasix held on admission for sepsis and AKI.  Resume on discharge.    Dysphagia:  Possibly from prior CVA.  SLP eval recommended Dysphagia 2 diet.  No other complaints.   Hypertension:  Well Controlled. Prn hydralazine added.   anemia of chronic disease/ Normocytic anemia/ anemia of blood loss from the surgery.  Hemoglobin around 8.3 today.    Transfuse if hemoglobin drops to less than 8.     Diabetes Mellitus:  Type 2. A1c 11.  CBG (last 3)   Recent Labs  08/17/16 0139 08/17/16 0642 08/17/16 1146  GLUCAP 197* 176* 133*    Better after adding the novolog 5 units TIDAC. Currently on 60 units of lantus daily. No changes to the regimen today.     H/o CVA:  With residual weakness.    DVT prophylaxis: heparin gtt.  Code Status: (Full) Family Communication: daughter at bedside.  Disposition Plan: [facility] when stable.  Consultants:   Vascular surgery Dr Donzetta Matters.   Infectious disease Dr Megan Salon  Wound care  Palliative care consult.    Procedures: ABI   Antimicrobials: rocephin from 6/18, flagyl from 6/17, IV vancomycin from 6/17   Subjective: ROS:  No chest pain or sob.  Opened eyes very briefly and went back to sleep.    Objective: Vitals:   08/17/16 0402 08/17/16 0415 08/17/16 0500 08/17/16 1321  BP: (!) 135/122 (!) 131/53  (!) 106/50  Pulse: 97 95  68  Resp: 16 16  18   Temp: 98.9 F (37.2 C)   97.8 F (36.6 C)  TempSrc: Axillary   Oral  SpO2: 100% 100%  100%  Weight:   97.4 kg (214 lb 12.8 oz)   Height:         Intake/Output Summary (Last 24 hours) at 08/17/16 1503 Last data filed at 08/17/16 0913  Gross per 24 hour  Intake          2362.08 ml  Output             1575 ml  Net           787.08 ml   Filed Weights   08/16/16 0426 08/16/16 1959 08/17/16 0500  Weight: 103.2 kg (227 lb 8 oz) 90.2 kg (198 lb 12.8 oz) 97.4 kg (214 lb 12.8 oz)    Examination:  General exam: sleeping comfortably.  Respiratory system: diminished at bases. No wheezing.  Cardiovascular system: s1s2 RRR.  Gastrointestinal system: soft NT ND BS+ Central nervous system: lethargic able to move extremities, answering simple questions.  Extremities: bilateral AKA. DRESSINGS  Skin: see above.  Psychiatry: flat affect,     Data Reviewed: I have personally reviewed following labs and imaging studies  CBC:  Recent Labs Lab 08/12/16 1724 08/13/16 0434 08/14/16 0514 08/15/16 0526 08/16/16 0358 08/16/16 1406 08/17/16 0258  WBC 8.7 9.6 9.6 7.5 9.7  --  13.7*  NEUTROABS 6.9 6.6  --   --   --   --   --   HGB 9.8* 9.3* 9.8* 8.6* 9.1* 7.5* 8.3*  HCT 30.5* 29.5* 29.4* 28.3* 28.3* 22.0* 26.9*  MCV 84.5 83.6 83.8 87.6 86.5  --  85.9  PLT 263 240 282 263 279  --  259   Basic Metabolic Panel:  Recent Labs Lab 08/13/16 0434 08/14/16 0514 08/15/16 0526 08/16/16 0358 08/16/16 1406 08/17/16 0258  NA 137 138 135 136 141 137  K 3.5 3.6 4.1 4.4 4.2 4.5  CL 104 105 104 105  --  105  CO2 27 25 23 23   --  25  GLUCOSE 227* 184* 305* 185* 110* 203*  BUN 19 18 19 19   --  15  CREATININE 1.10 0.97 1.13 0.99  --  1.12  CALCIUM 8.1* 8.4* 8.2* 8.5*  --  8.0*   GFR: Estimated Creatinine Clearance: 80.8 mL/min (by C-G formula based on SCr of 1.12 mg/dL). Liver Function Tests:  Recent Labs Lab 08/12/16 1724  AST 16  ALT 17  ALKPHOS 75  BILITOT 0.3  PROT 7.5  ALBUMIN 2.5*   No results for input(s): LIPASE, AMYLASE in the last 168 hours. No results for input(s): AMMONIA in the last 168 hours. Coagulation  Profile:  Recent Labs Lab 08/13/16 1203 08/16/16 0358  INR 1.71 1.10   Cardiac Enzymes: No results for input(s): CKTOTAL, CKMB, CKMBINDEX, TROPONINI in the last 168 hours. BNP (last 3 results) No results for input(s): PROBNP in the last 8760 hours. HbA1C:  Recent Labs  08/15/16 1116  HGBA1C 8.3*   CBG:  Recent Labs Lab 08/16/16  1728 08/16/16 2106 08/17/16 0139 08/17/16 0642 08/17/16 1146  GLUCAP 173* 201* 197* 176* 133*   Lipid Profile: No results for input(s): CHOL, HDL, LDLCALC, TRIG, CHOLHDL, LDLDIRECT in the last 72 hours. Thyroid Function Tests: No results for input(s): TSH, T4TOTAL, FREET4, T3FREE, THYROIDAB in the last 72 hours. Anemia Panel: No results for input(s): VITAMINB12, FOLATE, FERRITIN, TIBC, IRON, RETICCTPCT in the last 72 hours. Sepsis Labs:  Recent Labs Lab 08/12/16 1731  LATICACIDVEN 1.30    Recent Results (from the past 240 hour(s))  Blood culture (routine x 2)     Status: Abnormal   Collection Time: 08/12/16  5:29 PM  Result Value Ref Range Status   Specimen Description BLOOD LEFT ARM  Final   Special Requests BOTTLES DRAWN AEROBIC AND ANAEROBIC BCAV  Final   Culture  Setup Time   Final    GRAM NEGATIVE RODS GRAM POSITIVE COCCI IN PAIRS IN BOTH AEROBIC AND ANAEROBIC BOTTLES CRITICAL RESULT CALLED TO, READ BACK BY AND VERIFIED WITH: T GREEN,PHARMD AT 1151 08/13/16 BY L BENFIELD    Culture (A)  Final    ESCHERICHIA COLI ENTEROCOCCUS FAECALIS SUSCEPTIBILITIES PERFORMED ON PREVIOUS CULTURE WITHIN THE LAST 5 DAYS. Performed at [facility], [place] [address] [place],  [address]    Report Status 08/16/2016 FINAL  Final   Organism ID, Bacteria ESCHERICHIA COLI  Final      Susceptibility   Escherichia coli - MIC*    AMPICILLIN >=32 RESISTANT Resistant     CEFAZOLIN <=4 SENSITIVE Sensitive     CEFEPIME <=1 SENSITIVE Sensitive     CEFTAZIDIME <=1 SENSITIVE Sensitive     CEFTRIAXONE <=1 SENSITIVE Sensitive     CIPROFLOXACIN  <=0.25 SENSITIVE Sensitive     GENTAMICIN <=1 SENSITIVE Sensitive     IMIPENEM <=0.25 SENSITIVE Sensitive     TRIMETH/SULFA >=320 RESISTANT Resistant     AMPICILLIN/SULBACTAM 8 SENSITIVE Sensitive     PIP/TAZO <=4 SENSITIVE Sensitive     Extended ESBL NEGATIVE Sensitive     * ESCHERICHIA COLI  Blood Culture ID Panel (Reflexed)     Status: Abnormal   Collection Time: 08/12/16  5:29 PM  Result Value Ref Range Status   Enterococcus species DETECTED (A) NOT DETECTED Final    Comment: CRITICAL RESULT CALLED TO, READ BACK BY AND VERIFIED WITH: T GREEN,PHARMD AT 1151 08/13/16 BY L BENFIELD    Vancomycin resistance NOT DETECTED NOT DETECTED Final   Listeria monocytogenes NOT DETECTED NOT DETECTED Final   Staphylococcus species NOT DETECTED NOT DETECTED Final   Staphylococcus aureus NOT DETECTED NOT DETECTED Final   Streptococcus species NOT DETECTED NOT DETECTED Final   Streptococcus agalactiae NOT DETECTED NOT DETECTED Final   Streptococcus pneumoniae NOT DETECTED NOT DETECTED Final   Streptococcus pyogenes NOT DETECTED NOT DETECTED Final   Acinetobacter baumannii NOT DETECTED NOT DETECTED Final   Enterobacteriaceae species DETECTED (A) NOT DETECTED Final    Comment: CRITICAL RESULT CALLED TO, READ BACK BY AND VERIFIED WITH: T GREEN,PHARMD AT 1151 08/13/16 BY L BENFIELD    Enterobacter cloacae complex NOT DETECTED NOT DETECTED Final   Escherichia coli DETECTED (A) NOT DETECTED Final    Comment: CRITICAL RESULT CALLED TO, READ BACK BY AND VERIFIED WITH: T GREEN,PHARMD AT 1151 08/13/16 BY L BENFIELD    Klebsiella oxytoca NOT DETECTED NOT DETECTED Final   Klebsiella pneumoniae NOT DETECTED NOT DETECTED Final   Proteus species DETECTED (A) NOT DETECTED Final    Comment: CRITICAL RESULT CALLED TO, READ  BACK BY AND VERIFIED WITH: T GREEN,PHARMD AT 1151 08/13/16 BY L BENFIELD    Serratia marcescens NOT DETECTED NOT DETECTED Final   Carbapenem resistance NOT DETECTED NOT DETECTED Final    Haemophilus influenzae NOT DETECTED NOT DETECTED Final   Neisseria meningitidis NOT DETECTED NOT DETECTED Final   Pseudomonas aeruginosa NOT DETECTED NOT DETECTED Final   Candida albicans NOT DETECTED NOT DETECTED Final   Candida glabrata NOT DETECTED NOT DETECTED Final   Candida krusei NOT DETECTED NOT DETECTED Final   Candida parapsilosis NOT DETECTED NOT DETECTED Final   Candida tropicalis NOT DETECTED NOT DETECTED Final    Comment: Performed at [facility], [place] [address], [place], [place] [address]  Blood culture (routine x 2)     Status: Abnormal   Collection Time: 08/12/16  5:53 PM  Result Value Ref Range Status   Specimen Description BLOOD RIGHT HAND  Final   Special Requests   Final    BOTTLES DRAWN AEROBIC AND ANAEROBIC Blood Culture adequate volume   Culture  Setup Time   Final    GRAM NEGATIVE RODS IN BOTH AEROBIC AND ANAEROBIC BOTTLES CRITICAL RESULT CALLED TO, READ BACK BY AND VERIFIED WITH: E WILLIAMSON,PHARMD 1327 08/13/16 BY L BENFIELD GRAM POSITIVE COCCI IN PAIRS AEROBIC BOTTLE ONLY Performed at [facility], [place] [address], [place], [place] [address]    Culture PROTEUS MIRABILIS ENTEROCOCCUS FAECALIS  (A)  Final   Report Status 08/16/2016 FINAL  Final   Organism ID, Bacteria PROTEUS MIRABILIS  Final   Organism ID, Bacteria ENTEROCOCCUS FAECALIS  Final      Susceptibility   Enterococcus faecalis - MIC*    AMPICILLIN <=2 SENSITIVE Sensitive     VANCOMYCIN 1 SENSITIVE Sensitive     GENTAMICIN SYNERGY RESISTANT Resistant     * ENTEROCOCCUS FAECALIS   Proteus mirabilis - MIC*    AMPICILLIN <=2 SENSITIVE Sensitive     CEFAZOLIN <=4 SENSITIVE Sensitive     CEFEPIME <=1 SENSITIVE Sensitive     CEFTAZIDIME <=1 SENSITIVE Sensitive     CEFTRIAXONE <=1 SENSITIVE Sensitive     CIPROFLOXACIN <=0.25 SENSITIVE Sensitive     GENTAMICIN <=1 SENSITIVE Sensitive     IMIPENEM 2 SENSITIVE Sensitive     TRIMETH/SULFA <=20 SENSITIVE Sensitive      AMPICILLIN/SULBACTAM <=2 SENSITIVE Sensitive     PIP/TAZO <=4 SENSITIVE Sensitive     * PROTEUS MIRABILIS  MRSA PCR Screening     Status: None   Collection Time: 08/13/16  5:50 AM  Result Value Ref Range Status   MRSA by PCR NEGATIVE NEGATIVE Final    Comment:        The GeneXpert MRSA Assay (FDA approved for NASAL specimens only), is one component of a comprehensive MRSA colonization surveillance program. It is not intended to diagnose MRSA infection nor to guide or monitor treatment for MRSA infections.   Culture, blood (routine x 2)     Status: None (Preliminary result)   Collection Time: 08/14/16  5:14 AM  Result Value Ref Range Status   Specimen Description BLOOD RIGHT HAND  Final   Special Requests IN PEDIATRIC BOTTLE Blood Culture adequate volume  Final   Culture   Final    NO GROWTH 3 DAYS Performed at [facility], [place] [address] [place],  [address]    Report Status PENDING  Incomplete  Culture, blood (routine x 2)     Status: None (Preliminary result)   Collection Time: 08/14/16  5:44 AM  Result Value Ref Range Status   Specimen Description BLOOD RIGHT HAND  Final   Special Requests   Final    BOTTLES DRAWN AEROBIC ONLY Blood Culture adequate volume   Culture   Final    NO GROWTH 3 DAYS Performed at [facility], [address] [place], [place] [address]    Report Status PENDING  Incomplete         Radiology Studies: Ct Head Wo Contrast  Result Date: 08/17/2016 CLINICAL DATA:  Acute on chronic alteration in mental status. Slurred speech. EXAM: CT HEAD WITHOUT CONTRAST TECHNIQUE: Contiguous axial images were obtained from the base of the skull through the vertex without intravenous contrast. COMPARISON:  04/24/2016 FINDINGS: Brain: No acute hemorrhage. Stable degree of atrophy and chronic small vessel ischemia, advanced for age. Remote small lacunar infarct in the left pons. No evidence for acute ischemia. No mass effect or midline shift.  No subdural or extra-axial fluid collection. No hydrocephalus. Vascular: Atherosclerosis of skullbase vasculature without hyperdense vessel or abnormal calcification. Skull: No fracture or focal lesion. Sinuses/Orbits: Paranasal sinuses and mastoid air cells are clear. The visualized orbits are unremarkable. Bilateral cataract resection. Other: None. IMPRESSION: 1. No evidence of acute intracranial abnormality. 2. Stable atrophy and chronic small vessel ischemia, advanced for age. Electronically Signed   By: Jeb Levering M.D.   On: 08/17/2016 02:49        Scheduled Meds: . amLODipine  10 mg Oral Daily  . atorvastatin  40 mg Oral q1800  . bethanechol  10 mg Oral TID  . carvedilol  25 mg Oral BID WC  . cloNIDine  0.3 mg Oral TID  . docusate sodium  100 mg Oral Daily  . feeding supplement (GLUCERNA SHAKE)  237 mL Oral QID  . feeding supplement (PRO-STAT SUGAR FREE 64)  30 mL Oral BID  . FLUoxetine  10 mg Oral Daily  . insulin aspart  0-15 Units Subcutaneous TID WC  . insulin aspart  0-5 Units Subcutaneous QHS  . insulin aspart  5 Units Subcutaneous TID WC  . insulin glargine  60 Units Subcutaneous QHS  . isosorbide-hydrALAZINE  2 tablet Oral TID  . mirtazapine  7.5 mg Oral QHS  . pantoprazole  40 mg Oral Daily  . polyethylene glycol  17 g Oral Daily  . QUEtiapine  50 mg Oral Daily  . senna-docusate  1 tablet Oral Daily  . tamsulosin  0.4 mg Oral QPC supper  . zinc sulfate  220 mg Oral Daily   Continuous Infusions: . sodium chloride    . sodium chloride 125 mL/hr at 08/17/16 0501  . ampicillin-sulbactam (UNASYN) IV    . heparin 1,600 Units/hr (08/17/16 0908)  . magnesium sulfate 1 - 4 g bolus IVPB       LOS: 5 days    Time spent: 40 MINUTES.     Hosie Poisson, MD [facility] Pager [phone]  If 7PM-7AM, please contact night-coverage [email or web address] Password [facility] 08/17/2016, 3:03 PM

## 2016-08-17 NOTE — Significant Event (Signed)
Rapid Response Event Note  Overview: Time Called: 0130 Arrival Time: 0130 Event Type: Neurologic  Initial Focused Assessment:  Responded to call reference pt with recent bilateral AKA who presents with AMS and decreased responsiveness.  On exam, pt opens eyes to voice and is nonverbal.  He does follow some commands.  He has a noticeable R arm drift.  R pupil is 90mm and reactive, L pupil is 3 mm and nonreactive.  RN reports that around 2130, the pt was able to speak and was complaining of severe pain.  The pt was given 2 mg Dilaudid at 2135.   Narcan 0.4 mg given, pt was briefly able to state name and his location shortly after administration.  No drift in R arm after narcan admin.  Speech was slurred, but pt does have a hx of dysarthria.  Pt able to track with his eyes, denies any loss of sensation to face or extremities.  Dr. Cheral Marker was notified, decision was made not to call a code stroke after improvement with narcan.    Called Dr. Myna Hidalgo to update regarding discussion with Dr. Cheral Marker, decision was made to take pt down for head CT to rule out any acute event.  Pt's mental status continues to wax and wane, unsure of neurologic baseline for patient.  CT negative for any acute events.    CBG 196, BP 140/78, HR 100, 100% on 2L.  VS remained wnl throughout encounter.  Advised RN to contact rapid with any additional concerns or acute changes.    Event Summary: Name of Physician Notified: Opyd, MD at 0200  Name of Consulting Physician Notified: Cheral Marker, MD at [place] in room and stabalized  Event End Time: 0230  Pam Drown

## 2016-08-17 NOTE — Progress Notes (Signed)
Around 0145, this RN noticed that the patient was not responding to voice and had a blank stare and was nonverbal. Called rapid response. Patient exhibited right sided weakness and would not answer any questions. 0.4 narcan given. Patient waxed and waned in response to the narcan. At one point, patient seemed to be more alert but would also revert back to prior state. On call MD notified and stat CT of head was ordered. Patient received CT, results are posted. Patient's b/p fluctuates. No PO meds given until rounding MD assesses patient. This RN has concern for aspiration due to his change in LOC.

## 2016-08-17 NOTE — Progress Notes (Signed)
ANTICOAGULATION CONSULT NOTE - Follow Up Consult  Pharmacy Consult for Heparin (Xarelto on hold) Indication: Hx DVT, severe PAD  Allergies  Allergen Reactions  . Iohexol Hives  . Ivp Dye [Iodinated Diagnostic Agents] Hives    Patient Measurements: Height: 5\' 9"  (175.3 cm) Weight: 214 lb 12.8 oz (97.4 kg) IBW/kg (Calculated) : 70.7  Vital Signs: Temp: 97.8 F (36.6 C) (06/22 1321) Temp Source: Oral (06/22 1321) BP: 106/50 (06/22 1321) Pulse Rate: 68 (06/22 1321)  Labs:  Recent Labs  08/14/16 2243  08/15/16 0526  08/15/16 1116 08/15/16 1547 08/16/16 0358 08/16/16 1406 08/17/16 0258 08/17/16 1703  HGB  --   < > 8.6*  --   --   --  9.1* 7.5* 8.3*  --   HCT  --   < > 28.3*  --   --   --  28.3* 22.0* 26.9*  --   PLT  --   --  263  --   --   --  279  --  311  --   APTT 42*  --   --   --  113*  --   --   --   --   --   LABPROT  --   --   --   --   --   --  14.2  --   --   --   INR  --   --   --   --   --   --  1.10  --   --   --   HEPARINUNFRC  --   --   --   < > 0.39 0.27* 0.43  --   --  0.18*  CREATININE  --   --  1.13  --   --   --  0.99  --  1.12  --   < > = values in this interval not displayed.  Estimated Creatinine Clearance: 80.8 mL/min (by C-G formula based on SCr of 1.12 mg/dL).  Assessment: 31 yom on xarelto PTA for hx of DVT, on IV heparin while Xarelto is on hold for Vascular procedure. Now s/p B/L AKA, pharmacy is consulted to restart IV heparin tomorrow AM at 0800. He was previously therapeutic on 2000 units/hr.  Initial heparin level is subtherapeutic at 0.18 on heparin infusion at 1600 units/hr. No issues with infusion or sxs of bleeding although hgb low at 8.3.   Goal of Therapy:  Heparin level 0.3-0.7 units/ml Monitor platelets by anticoagulation protocol: Yes   Plan:  1. Increase heparin to 1900 units/hr 2. Recheck heparin level in 8 hours 3. Daily heparin level and CBC  Vincenza Hews, PharmD, BCPS 08/17/2016, 6:04 PM      08/17/2016 6:03  PM

## 2016-08-17 NOTE — Progress Notes (Signed)
Pharmacy Antibiotic Note  [name] is a 61 y.o. male admitted on 08/12/2016 with polymicrobial bacteremia, bilateral osteomyelitis. S/p bilateral AKA scheduled on 6/21 Pharmacy has been consulted to de-escalate from vancomycin/CTX/flagyl to Unasyn dosing - abx day #6. Afebrile, WBC wnl. SCr 1.12 stable.   Plan: Unasyn 3g IV q6h Monitor clinical progress, c/s, renal function, LOT  Height: 5\' 9"  (175.3 cm) Weight: 214 lb 12.8 oz (97.4 kg) IBW/kg (Calculated) : 70.7  Temp (24hrs), Avg:98.2 F (36.8 C), Min:97.2 F (36.2 C), Max:99.3 F (37.4 C)   Recent Labs Lab 08/12/16 1731 08/13/16 0434 08/14/16 0514 08/15/16 0526 08/16/16 0358 08/17/16 0258  WBC  --  9.6 9.6 7.5 9.7 13.7*  CREATININE  --  1.10 0.97 1.13 0.99 1.12  LATICACIDVEN 1.30  --   --   --   --   --     Estimated Creatinine Clearance: 80.8 mL/min (by C-G formula based on SCr of 1.12 mg/dL).    Allergies  Allergen Reactions  . Iohexol Hives  . Ivp Dye [Iodinated Diagnostic Agents] Hives    Antimicrobials this admission: 6/17 vancomycin >> 6/22 6/17 zosyn x 1 6/17 Ceftriaxone >> 6/22 6/17 metronidazole >> 6/22 6/22 unasyn >>   Dose adjustments this admission: 6/19 vanco to 1gm q12h for improved renal fx  Microbiology results: 6/17BCx: 2/2 Proteus (pan-S), Enterococcus faecalis (S-amp) 6/17 MRSA PCR: neg 6/19 BCx: ngtd   Elicia Lamp, PharmD, BCPS Clinical Pharmacist Rx Phone # for today: [phone] After 3:30PM, please call Main Rx: #[id number] 08/17/2016 9:41 AM

## 2016-08-17 NOTE — Progress Notes (Signed)
[place] for Infectious Disease    Date of Admission:  08/12/2016   Total days of antibiotics 6           ID: [name] is a 61 y.o. male with chronic dCHF, uncontrolled DM2, hx of CVA with dysarthria, PVD with bilateral feet osteo and polymicrobial bacteremia POD #1 bilateral AKA Active Problems:   Diabetes type 2, uncontrolled (HCC)   Hypertension   Chronic diastolic CHF (congestive heart failure) (HCC)   Peripheral neuropathy   Personal history of DVT (deep vein thrombosis)   History of DVT (deep vein thrombosis)   Coronary artery disease involving native coronary artery of native heart without angina pectoris   Cognitive and behavioral changes   Dysphagia due to recent stroke   AKI (acute kidney injury) ([place])   Pressure injury of skin   History of CVA (cerebrovascular accident)   Osteomyelitis of right foot ([place])   Enterococcal bacteremia   Bacteremia due to Escherichia coli   Proteus infection   Osteomyelitis of left foot (HCC)   Goals of care, counseling/discussion   Palliative care by specialist    Subjective:  Afebrile, having lower extremity leg pain.  24hr events: Underwent bilateral leg amputation yesterday. Last night was found to have AMS and signs concerning for new onset stroke where he received narcan where neuro symptoms improved and also NCHCT that did not show any new insult. Labs this am show increase in WBC to 13.7  Medications:  . amLODipine  10 mg Oral Daily  . atorvastatin  40 mg Oral q1800  . bethanechol  10 mg Oral TID  . carvedilol  25 mg Oral BID WC  . cloNIDine  0.3 mg Oral TID  . docusate sodium  100 mg Oral Daily  . feeding supplement (GLUCERNA SHAKE)  237 mL Oral QID  . feeding supplement (PRO-STAT SUGAR FREE 64)  30 mL Oral BID  . FLUoxetine  10 mg Oral Daily  . insulin aspart  0-15 Units Subcutaneous TID WC  . insulin aspart  0-5 Units Subcutaneous QHS  . insulin aspart  5 Units Subcutaneous TID WC  . insulin glargine   60 Units Subcutaneous QHS  . isosorbide-hydrALAZINE  2 tablet Oral TID  . mirtazapine  7.5 mg Oral QHS  . pantoprazole  40 mg Oral Daily  . polyethylene glycol  17 g Oral Daily  . QUEtiapine  50 mg Oral Daily  . senna-docusate  1 tablet Oral Daily  . tamsulosin  0.4 mg Oral QPC supper  . zinc sulfate  220 mg Oral Daily    Objective: Vital signs in last 24 hours: Temp:  [97.2 F (36.2 C)-99.3 F (37.4 C)] 98.9 F (37.2 C) (06/22 0402) Pulse Rate:  [70-108] 95 (06/22 0415) Resp:  [12-22] 16 (06/22 0415) BP: (108-220)/(53-129) 131/53 (06/22 0415) SpO2:  [94 %-100 %] 100 % (06/22 0415) Weight:  [198 lb 12.8 oz (90.2 kg)-214 lb 12.8 oz (97.4 kg)] 214 lb 12.8 oz (97.4 kg) (06/22 0500) gen = a xo by 1, sleeping. Opens eyes to verbal stimuli Pulm= CTAB, no w/c/r Cors= nl s1,s2 no g/m/r Ext = bilateral aka, wrapped, blood tinged dressing noted on right AKA  Lab Results  Recent Labs  08/16/16 0358 08/16/16 1406 08/17/16 0258  WBC 9.7  --  13.7*  HGB 9.1* 7.5* 8.3*  HCT 28.3* 22.0* 26.9*  NA 136 141 137  K 4.4 4.2 4.5  CL 105  --  105  CO2 23  --  25  BUN 19  --  15  CREATININE 0.99  --  1.12   Liver Panel No results for input(s): PROT, ALBUMIN, AST, ALT, ALKPHOS, BILITOT, BILIDIR, IBILI in the last 72 hours. Sedimentation Rate  Recent Labs  08/17/16 0258  ESRSEDRATE 138*   C-Reactive Protein  Recent Labs  08/16/16 0358 08/17/16 0258  CRP 9.3* 8.2*    Microbiology: ecoli amp R, amp/sub S Proteus amp s E.faecalis amp S Studies/Results: Ct Head Wo Contrast  Result Date: 08/17/2016 CLINICAL DATA:  Acute on chronic alteration in mental status. Slurred speech. EXAM: CT HEAD WITHOUT CONTRAST TECHNIQUE: Contiguous axial images were obtained from the base of the skull through the vertex without intravenous contrast. COMPARISON:  04/24/2016 FINDINGS: Brain: No acute hemorrhage. Stable degree of atrophy and chronic small vessel ischemia, advanced for age. Remote small  lacunar infarct in the left pons. No evidence for acute ischemia. No mass effect or midline shift. No subdural or extra-axial fluid collection. No hydrocephalus. Vascular: Atherosclerosis of skullbase vasculature without hyperdense vessel or abnormal calcification. Skull: No fracture or focal lesion. Sinuses/Orbits: Paranasal sinuses and mastoid air cells are clear. The visualized orbits are unremarkable. Bilateral cataract resection. Other: None. IMPRESSION: 1. No evidence of acute intracranial abnormality. 2. Stable atrophy and chronic small vessel ischemia, advanced for age. Electronically Signed   By: Jeb Levering M.D.   On: 08/17/2016 02:49     Assessment/Plan: Polymicrobial bacteremia = will narrow abtx to amp/sub to treat bacteremia for a total of 14d would use 6/19 as day 1 since repeat blood cx were NGTD. Will d/c vanco, ceftriaxone, and metronidazole. abtx end date 7/2  Leukocytosis = likely related to post surgical stress. Will continue to monitor  Bilateral BKA = monitor H/H for post op blood loss and possible need for rbc transfusion  [name] for Infectious Diseases Cell: [phone] Pager: [phone]  08/17/2016, 9:22 AM

## 2016-08-17 NOTE — Progress Notes (Addendum)
[facility] Progress Note  Subjective  - POD #1  Sleepy this am. Does slowly answer when questioned. Follows commands.   Objective Vitals:   08/17/16 0402 08/17/16 0415  BP: (!) 135/122 (!) 131/53  Pulse: 97 95  Resp: 16 16  Temp: 98.9 F (37.2 C)     Intake/Output Summary (Last 24 hours) at 08/17/16 0748 Last data filed at 08/17/16 0630  Gross per 24 hour  Intake          3812.08 ml  Output             2975 ml  Net           837.08 ml   Lethargic this am. States his name. Open eyes to command. Squeezes both hands. Right grip strength weaker than left.  Bilateral AKA dressings clean  Assessment/Planning: 61 y.o. male is s/p: bilateral AKAs 1 Day Post-Op   Will take down dressings tomorrow.  Had episode of decreased responsiveness early this am around 255. CT negative. Following commands this am, but is lethargic.   Alvia Grove 08/17/2016 7:48 AM --  Laboratory CBC    Component Value Date/Time   WBC 13.7 (H) 08/17/2016 0258   HGB 8.3 (L) 08/17/2016 0258   HCT 26.9 (L) 08/17/2016 0258   PLT 311 08/17/2016 0258    BMET    Component Value Date/Time   NA 137 08/17/2016 0258   NA 140 06/19/2016   K 4.5 08/17/2016 0258   CL 105 08/17/2016 0258   CO2 25 08/17/2016 0258   GLUCOSE 203 (H) 08/17/2016 0258   BUN 15 08/17/2016 0258   BUN 12 06/19/2016   CREATININE 1.12 08/17/2016 0258   CREATININE 1.08 06/05/2016 1227   CALCIUM 8.0 (L) 08/17/2016 0258   GFRNONAA >60 08/17/2016 0258   GFRAA >60 08/17/2016 0258    COAG Lab Results  Component Value Date   INR 1.10 08/16/2016   INR 1.71 08/13/2016   INR 1.21 05/10/2016   No results found for: PTT  Antibiotics Anti-infectives    Start     Dose/Rate Route Frequency Ordered Stop   08/16/16 1600  cefUROXime (ZINACEF) 1.5 g in dextrose 5 % 50 mL IVPB     1.5 g 100 mL/hr over 30 Minutes Intravenous To ShortStay Surgical 08/15/16 2119 08/16/16 1244   08/14/16 0800  vancomycin (VANCOCIN)  IVPB 1000 mg/200 mL premix     1,000 mg 200 mL/hr over 60 Minutes Intravenous Every 12 hours 08/14/16 0653     08/13/16 1800  vancomycin (VANCOCIN) 1,250 mg in sodium chloride 0.9 % 250 mL IVPB  Status:  Discontinued     1,250 mg 166.7 mL/hr over 90 Minutes Intravenous Every 24 hours 08/12/16 1845 08/14/16 0653   08/13/16 0000  piperacillin-tazobactam (ZOSYN) IVPB 3.375 g  Status:  Discontinued     3.375 g 12.5 mL/hr over 240 Minutes Intravenous Every 8 hours 08/12/16 1845 08/12/16 1948   08/13/16 0000  cefTRIAXone (ROCEPHIN) 2 g in dextrose 5 % 50 mL IVPB     2 g 100 mL/hr over 30 Minutes Intravenous Every 24 hours 08/12/16 1948     08/12/16 2200  metroNIDAZOLE (FLAGYL) IVPB 500 mg     500 mg 100 mL/hr over 60 Minutes Intravenous Every 8 hours 08/12/16 1948     08/12/16 1815  vancomycin (VANCOCIN) 2,000 mg in sodium chloride 0.9 % 500 mL IVPB     2,000 mg 250 mL/hr over 120 Minutes Intravenous  Once  08/12/16 1804 08/12/16 2128   08/12/16 1800  piperacillin-tazobactam (ZOSYN) IVPB 3.375 g     3.375 g 100 mL/hr over 30 Minutes Intravenous  Once 08/12/16 1748 08/12/16 1924   08/12/16 1800  vancomycin (VANCOCIN) IVPB 1000 mg/200 mL premix  Status:  Discontinued     1,000 mg 200 mL/hr over 60 Minutes Intravenous  Once 08/12/16 1748 08/12/16 [place], PA-C [facility] Office: [phone] Pager: [phone] 08/17/2016 7:48 AM  I agree with the above.  S/p bilateral AKA.  Will change dressings tomorrow.  Needs pain control.  [name]

## 2016-08-18 ENCOUNTER — Inpatient Hospital Stay (HOSPITAL_COMMUNITY): Payer: Medicare Other

## 2016-08-18 DIAGNOSIS — E11649 Type 2 diabetes mellitus with hypoglycemia without coma: Secondary | ICD-10-CM

## 2016-08-18 DIAGNOSIS — Z91041 Radiographic dye allergy status: Secondary | ICD-10-CM

## 2016-08-18 DIAGNOSIS — Z8739 Personal history of other diseases of the musculoskeletal system and connective tissue: Secondary | ICD-10-CM

## 2016-08-18 DIAGNOSIS — R509 Fever, unspecified: Secondary | ICD-10-CM

## 2016-08-18 LAB — AMMONIA: AMMONIA: 38 umol/L — AB (ref 9–35)

## 2016-08-18 LAB — BASIC METABOLIC PANEL
Anion gap: 7 (ref 5–15)
BUN: 17 mg/dL (ref 6–20)
CALCIUM: 7.4 mg/dL — AB (ref 8.9–10.3)
CO2: 23 mmol/L (ref 22–32)
Chloride: 110 mmol/L (ref 101–111)
Creatinine, Ser: 1.66 mg/dL — ABNORMAL HIGH (ref 0.61–1.24)
GFR calc non Af Amer: 43 mL/min — ABNORMAL LOW (ref >60)
GFR, EST AFRICAN AMERICAN: 50 mL/min — AB (ref >60)
Glucose, Bld: 47 mg/dL — ABNORMAL LOW (ref 65–99)
Potassium: 4 mmol/L (ref 3.5–5.1)
SODIUM: 140 mmol/L (ref 135–145)

## 2016-08-18 LAB — CBC
HEMATOCRIT: 23.1 % — AB (ref 39.0–52.0)
HEMOGLOBIN: 7.2 g/dL — AB (ref 13.0–17.0)
MCH: 27.5 pg (ref 26.0–34.0)
MCHC: 31.2 g/dL (ref 30.0–36.0)
MCV: 88.2 fL (ref 78.0–100.0)
Platelets: 315 10*3/uL (ref 150–400)
RBC: 2.62 MIL/uL — ABNORMAL LOW (ref 4.22–5.81)
RDW: 14.7 % (ref 11.5–15.5)
WBC: 13 10*3/uL — ABNORMAL HIGH (ref 4.0–10.5)

## 2016-08-18 LAB — GLUCOSE, CAPILLARY
GLUCOSE-CAPILLARY: 37 mg/dL — AB (ref 65–99)
GLUCOSE-CAPILLARY: 58 mg/dL — AB (ref 65–99)
GLUCOSE-CAPILLARY: 60 mg/dL — AB (ref 65–99)
GLUCOSE-CAPILLARY: 101 mg/dL — AB (ref 65–99)
Glucose-Capillary: 78 mg/dL (ref 65–99)
Glucose-Capillary: 90 mg/dL (ref 65–99)
Glucose-Capillary: 106 mg/dL — ABNORMAL HIGH (ref 65–99)
Glucose-Capillary: 168 mg/dL — ABNORMAL HIGH (ref 65–99)

## 2016-08-18 LAB — URINALYSIS, ROUTINE W REFLEX MICROSCOPIC
BILIRUBIN URINE: NEGATIVE
Glucose, UA: NEGATIVE mg/dL
HGB URINE DIPSTICK: NEGATIVE
KETONES UR: NEGATIVE mg/dL
Nitrite: NEGATIVE
PROTEIN: 30 mg/dL — AB
SQUAMOUS EPITHELIAL / LPF: NONE SEEN
Specific Gravity, Urine: 1.013 (ref 1.005–1.030)
pH: 5 (ref 5.0–8.0)

## 2016-08-18 LAB — LACTIC ACID, PLASMA: Lactic Acid, Venous: 0.5 mmol/L (ref 0.5–1.9)

## 2016-08-18 LAB — C-REACTIVE PROTEIN: CRP: 13.4 mg/dL — AB (ref <1.0)

## 2016-08-18 LAB — HEPARIN LEVEL (UNFRACTIONATED)
HEPARIN UNFRACTIONATED: 0.13 [IU]/mL — AB (ref 0.30–0.70)
HEPARIN UNFRACTIONATED: 0.27 [IU]/mL — AB (ref 0.30–0.70)

## 2016-08-18 LAB — PREPARE RBC (CROSSMATCH)

## 2016-08-18 LAB — SODIUM, URINE, RANDOM: Sodium, Ur: 38 mmol/L

## 2016-08-18 LAB — CREATININE, URINE, RANDOM: Creatinine, Urine: 90.98 mg/dL

## 2016-08-18 LAB — SEDIMENTATION RATE: Sed Rate: >140 mm/hr — ABNORMAL HIGH (ref 0–16)

## 2016-08-18 LAB — PROCALCITONIN: Procalcitonin: 1.35 ng/mL

## 2016-08-18 MED ORDER — DEXTROSE 50 % IV SOLN
INTRAVENOUS | Status: AC
  Administered 2016-08-18 (×2): 25 mL
  Filled 2016-08-18: qty 50

## 2016-08-18 MED ORDER — DEXTROSE 50 % IV SOLN: 50.0000 mL | Freq: Once | INTRAVENOUS | Status: AC

## 2016-08-18 MED ORDER — SODIUM CHLORIDE 0.9 % IV SOLN: Freq: Once | INTRAVENOUS | Status: DC

## 2016-08-18 MED ORDER — DEXTROSE 50 % IV SOLN
INTRAVENOUS | Status: AC
  Administered 2016-08-18: 50 mL
  Filled 2016-08-18: qty 50

## 2016-08-18 MED ORDER — DEXTROSE-NACL 5-0.9 % IV SOLN
INTRAVENOUS | Status: DC
  Administered 2016-08-18 (×2): via INTRAVENOUS
  Administered 2016-08-19: 100 mL/h via INTRAVENOUS
  Administered 2016-08-20: 50 mL/h via INTRAVENOUS
  Administered 2016-08-21: 05:00:00 via INTRAVENOUS

## 2016-08-18 MED ORDER — TRAMADOL HCL 50 MG PO TABS
100.0000 mg | ORAL_TABLET | Freq: Four times a day (QID) | ORAL | Status: DC | PRN
  Administered 2016-08-18 – 2016-08-21 (×3): 100 mg via ORAL
  Filled 2016-08-18 (×3): qty 2

## 2016-08-18 MED ORDER — SODIUM CHLORIDE 0.9 % IV SOLN: INTRAVENOUS | Status: DC

## 2016-08-18 NOTE — Progress Notes (Signed)
[facility] for Heparin Indication: Hx DVT, severe PAD  Allergies  Allergen Reactions  . Iohexol Hives  . Ivp Dye [Iodinated Diagnostic Agents] Hives    Patient Measurements: Height: 5\' 9"  (175.3 cm) Weight: 203 lb 6.4 oz (92.3 kg) IBW/kg (Calculated) : 70.7  Vital Signs: Temp: 99.9 F (37.7 C) (06/23 0825) Temp Source: Axillary (06/23 0825) BP: 138/47 (06/23 0825) Pulse Rate: 77 (06/23 0825)  Labs:  Recent Labs  08/16/16 0358 08/16/16 1406 08/17/16 0258 08/17/16 1703 08/18/16 0225 08/18/16 1249  HGB 9.1* 7.5* 8.3*  --  7.2*  --   HCT 28.3* 22.0* 26.9*  --  23.1*  --   PLT 279  --  311  --  315  --   LABPROT 14.2  --   --   --   --   --   INR 1.10  --   --   --   --   --   HEPARINUNFRC 0.43  --   --  0.18* 0.13* 0.27*  CREATININE 0.99  --  1.12  --  1.66*  --     Estimated Creatinine Clearance: 53.1 mL/min (A) (by C-G formula based on SCr of 1.66 mg/dL (H)).  Assessment: 61 yo male with h/o DVT, s/p B/L AKA and Xarelto on hold, for heparin as a bridge for surgery 6/21.  Repeat heparin level is 0.27 and just below desired goal range of 0.3-0.7.  No acute bleeding noted, however he has dropped his hemoglobin some (8.3>>7.2).  Renal function has changed with creatinine 1.66 and an estimated crcl 70ml/min.  Head CT was negative for bleed, MRI has been ordered due to ongoing lethargy.  Goal of Therapy:  Heparin level 0.3-0.7 units/ml Monitor platelets by anticoagulation protocol: Yes   Plan:  - Will increase Heparin slightly to 2100 units/hr - Check heparin level with AM labs since he is so near goal range. - Daily CBC  Rober Minion, PharmD., MS Clinical Pharmacist Pager:  [phone] Thank you for allowing pharmacy to be part of this patients care team. 08/18/2016 2:09 PM

## 2016-08-18 NOTE — NC FL2 (Signed)
[place] MEDICAID FL2 LEVEL OF CARE SCREENING TOOL     IDENTIFICATION  Patient Name: [name] Birthdate: 05-02-1955 Sex: male Admission Date (Current Location): 08/12/2016  [facility] and [place] Number:  Herbalist and Address:  [facility], [place] [address] [place], [place] [address]      Provider Number: [id number]  Attending Physician Name and Address:  Hosie Poisson, MD  Relative Name and Phone Number:       Current Level of Care: Hospital Recommended Level of Care: [place] Prior Approval Number:    Date Approved/Denied:   PASRR Number:  [id number]   Discharge Plan: SNF    Current Diagnoses: Patient Active Problem List   Diagnosis Date Noted  . Status post bilateral above knee amputation ([place])   . Goals of care, counseling/discussion   . Palliative care by specialist   . Enterococcal bacteremia 08/13/2016  . Bacteremia due to Escherichia coli 08/13/2016  . Proteus infection 08/13/2016  . Osteomyelitis of left foot ([place]) 08/13/2016  . History of CVA (cerebrovascular accident) 08/12/2016  . Osteomyelitis of right foot ([place]) 08/12/2016  . HLD (hyperlipidemia) 05/15/2016  . AKI (acute kidney injury) ([place]) 05/10/2016  . Pressure injury of skin 05/10/2016  . Dysphagia due to recent stroke 04/28/2016  . Hyperlipidemia associated with type 2 diabetes mellitus ([place]) 04/28/2016  . Depression 04/28/2016  . Cognitive and behavioral changes   . Cerebrovascular accident (CVA) due to embolism of left posterior cerebral artery ([place])   . Renovascular hypertension   . History of DVT (deep vein thrombosis)   . Coronary artery disease involving native coronary artery of native heart without angina pectoris   . Morbid obesity ([place])   . Normocytic anemia   . Embolic stroke involving posterior cerebral artery ([place]) 04/03/2016  . Brainstem stroke ([place]) 04/01/2016  . Abnormality of gait and mobility   . Cerebral infarction  due to thrombosis of other cerebral artery ([place])   . Right arm numbness   . Left sided numbness 03/29/2016  . TIA (transient ischemic attack) 03/29/2016  . Hypertensive urgency 03/29/2016  . Sepsis ([place]) 09/10/2013  . Personal history of DVT (deep vein thrombosis) 01/01/2013  . Diabetes type 2, uncontrolled ([place]) 12/29/2012  . Hypertension   . Cardiomyopathy ([place])   . Chronic diastolic CHF (congestive heart failure) ([place])   . Peripheral neuropathy   . Special screening for malignant neoplasms, colon 12/09/2012    Orientation RESPIRATION BLADDER Height & Weight     Self  O2 (Nasal Cannula 2L) Incontinent, Indwelling catheter ([place] Catheter placed on 6/22) Weight: 203 lb 6.4 oz (92.3 kg) Height:  5\' 9"  (175.3 cm)  BEHAVIORAL SYMPTOMS/MOOD NEUROLOGICAL BOWEL NUTRITION STATUS      Incontinent  (Please see d/c summary)  AMBULATORY STATUS COMMUNICATION OF NEEDS Skin   Total Care Verbally PU Stage and Appropriate Care, Surgical wounds (PU right and left ankle, unstagable, dressing change twice a day.  Closed incision left and right thighs, compression wrapped.)                       Personal Care Assistance Level of Assistance  Bathing, Feeding, Dressing Bathing Assistance: Maximum assistance Feeding assistance: Limited assistance Dressing Assistance: Maximum assistance     Functional Limitations Info  Sight, Hearing, Speech Sight Info: Adequate Hearing Info: Adequate Speech Info: Adequate    SPECIAL CARE FACTORS FREQUENCY  PT (By licensed PT), OT (By licensed OT)  PT Frequency: min 2x week  OT Frequency: min 2x week            Contractures Contractures Info: Not present    Additional Factors Info  Code Status, Allergies Code Status Info: Full Code Allergies Info: Iohexol, Ivp Dye Iodinated Diagnostic Agents           Current Medications (08/18/2016):  This is the current hospital active medication list Current Facility-Administered Medications   Medication Dose Route Frequency Provider Last Rate Last Dose  . 0.9 %  sodium chloride infusion   Intravenous Once Oleta Mouse, MD      . 0.9 %  sodium chloride infusion   Intravenous Once Hosie Poisson, MD      . acetaminophen (TYLENOL) tablet 650 mg  650 mg Oral Q6H PRN Opyd, Ilene Qua, MD   650 mg at 08/14/16 0526   Or  . acetaminophen (TYLENOL) suppository 650 mg  650 mg Rectal Q6H PRN Vianne Bulls, MD   650 mg at 08/18/16 0823  . albuterol (PROVENTIL) (2.5 MG/3ML) 0.083% nebulizer solution 2.5 mg  2.5 mg Nebulization Q4H PRN Opyd, Ilene Qua, MD      . alum & mag hydroxide-simeth (MAALOX/MYLANTA) 200-200-20 MG/5ML suspension 15-30 mL  15-30 mL Oral Q2H PRN Laurence Slate M, PA-C      . amLODipine (NORVASC) tablet 10 mg  10 mg Oral Daily Opyd, Ilene Qua, MD   10 mg at 08/17/16 1112  . Ampicillin-Sulbactam (UNASYN) 3 g in sodium chloride 0.9 % 100 mL IVPB  3 g Intravenous Q6H Romona Curls, [place] II   Stopped at 08/18/16 0546  . atorvastatin (LIPITOR) tablet 40 mg  40 mg Oral q1800 Opyd, Ilene Qua, MD   40 mg at 08/17/16 1719  . bethanechol (URECHOLINE) tablet 10 mg  10 mg Oral TID Vianne Bulls, MD   10 mg at 08/17/16 2144  . bisacodyl (DULCOLAX) suppository 10 mg  10 mg Rectal PRN Opyd, Ilene Qua, MD      . carvedilol (COREG) tablet 25 mg  25 mg Oral BID WC Opyd, Ilene Qua, MD   25 mg at 08/17/16 1719  . cloNIDine (CATAPRES) tablet 0.3 mg  0.3 mg Oral TID Vianne Bulls, MD   0.3 mg at 08/17/16 2144  . dextrose 5 %-0.9 % sodium chloride infusion   Intravenous Continuous Hosie Poisson, MD      . docusate sodium (COLACE) capsule 100 mg  100 mg Oral Daily Collins, Emma M, PA-C      . feeding supplement (GLUCERNA SHAKE) (GLUCERNA SHAKE) liquid 237 mL  237 mL Oral QID Vianne Bulls, MD   237 mL at 08/16/16 1831  . feeding supplement (PRO-STAT SUGAR FREE 64) liquid 30 mL  30 mL Oral BID Opyd, Ilene Qua, MD   30 mL at 08/15/16 2107  . FLUoxetine (PROZAC) capsule 10 mg  10 mg Oral Daily  Opyd, Ilene Qua, MD   10 mg at 08/16/16 0829  . guaiFENesin-dextromethorphan (ROBITUSSIN DM) 100-10 MG/5ML syrup 15 mL  15 mL Oral Q4H PRN Laurence Slate M, PA-C      . heparin ADULT infusion 100 units/mL (25000 units/254mL sodium chloride 0.45%)  2,000 Units/hr Intravenous Continuous Hosie Poisson, MD 20 mL/hr at 08/18/16 0428 2,000 Units/hr at 08/18/16 0428  . hydrALAZINE (APRESOLINE) injection 5 mg  5 mg Intravenous Q20 Min PRN Laurence Slate M, PA-C   5 mg at 08/16/16 2049  . hydrocortisone (ANUSOL-HC) 2.5 % rectal cream   Rectal  BID PRN Opyd, Ilene Qua, MD      . insulin aspart (novoLOG) injection 0-15 Units  0-15 Units Subcutaneous TID WC Opyd, Ilene Qua, MD   2 Units at 08/17/16 1218  . insulin aspart (novoLOG) injection 0-5 Units  0-5 Units Subcutaneous QHS Vianne Bulls, MD   4 Units at 08/15/16 2116  . insulin glargine (LANTUS) injection 60 Units  60 Units Subcutaneous QHS Vianne Bulls, MD   60 Units at 08/16/16 2150  . isosorbide-hydrALAZINE (BIDIL) 20-37.5 MG per tablet 2 tablet  2 tablet Oral TID Vianne Bulls, MD   2 tablet at 08/17/16 2144  . labetalol (NORMODYNE,TRANDATE) injection 10 mg  10 mg Intravenous Q10 min PRN Laurence Slate M, PA-C   10 mg at 08/16/16 2008  . lidocaine (XYLOCAINE) 2 % jelly   Topical PRN Opyd, Ilene Qua, MD      . magnesium sulfate IVPB 2 g 50 mL  2 g Intravenous Daily PRN Laurence Slate M, PA-C      . metoprolol tartrate (LOPRESSOR) injection 2-5 mg  2-5 mg Intravenous Q2H PRN Laurence Slate M, PA-C      . mirtazapine (REMERON) tablet 7.5 mg  7.5 mg Oral QHS Opyd, Ilene Qua, MD   7.5 mg at 08/17/16 2144  . naloxone [facility] injection 0.4 mg  0.4 mg Intravenous PRN Hosie Poisson, MD   0.4 mg at 08/17/16 0143  . ondansetron (ZOFRAN) tablet 4 mg  4 mg Oral Q6H PRN Opyd, Ilene Qua, MD       Or  . ondansetron (ZOFRAN) injection 4 mg  4 mg Intravenous Q6H PRN Opyd, Ilene Qua, MD      . oxyCODONE-acetaminophen (PERCOCET/ROXICET) 5-325 MG per tablet 1-2 tablet   1-2 tablet Oral Q4H PRN Ulyses Amor, PA-C   2 tablet at 08/16/16 2047  . pantoprazole (PROTONIX) EC tablet 40 mg  40 mg Oral Daily Collins, Emma M, PA-C      . phenol (CHLORASEPTIC) mouth spray 1 spray  1 spray Mouth/Throat PRN Laurence Slate M, PA-C      . polyethylene glycol (MIRALAX / GLYCOLAX) packet 17 g  17 g Oral Daily Opyd, Ilene Qua, MD   17 g at 08/15/16 1055  . potassium chloride SA (K-DUR,KLOR-CON) CR tablet 20-40 mEq  20-40 mEq Oral Daily PRN Laurence Slate M, PA-C      . QUEtiapine (SEROQUEL XR) 24 hr tablet 50 mg  50 mg Oral Daily Opyd, Ilene Qua, MD   50 mg at 08/17/16 1113  . senna-docusate (Senokot-S) tablet 1 tablet  1 tablet Oral Daily Opyd, Ilene Qua, MD   1 tablet at 08/16/16 0943  . sodium phosphate (FLEET) 7-19 GM/118ML enema 1 enema  1 enema Rectal Once PRN Opyd, Ilene Qua, MD      . tamsulosin (FLOMAX) capsule 0.4 mg  0.4 mg Oral QPC supper Opyd, Ilene Qua, MD   0.4 mg at 08/17/16 1719  . zinc sulfate capsule 220 mg  220 mg Oral Daily Opyd, Ilene Qua, MD   220 mg at 08/16/16 7829     Discharge Medications: Please see discharge summary for a list of discharge medications.  Relevant Imaging Results:  Relevant Lab Results:   Additional Information SSN: [id number]  Eileen Stanford, LCSW

## 2016-08-18 NOTE — Progress Notes (Addendum)
[facility] Progress Note  Subjective  - POD #2  Very sleepy this am. Not talking. Follows some commands.   Objective Vitals:   08/17/16 2006 08/18/16 0556  BP: 126/62 (!) 139/49  Pulse: 86 77  Resp: 20 20  Temp: 98.7 F (37.1 C) (!) 100.5 F (38.1 C)    Intake/Output Summary (Last 24 hours) at 08/18/16 0736 Last data filed at 08/18/16 4193  Gross per 24 hour  Intake              200 ml  Output              950 ml  Net             -750 ml   Only opens left eye partially. Squeezes left hand. Does not squeeze right hand. Yesterday did squeeze right hand.  Bilateral AKA dressings taken down. Incisions are both clean with staples intact and viable skin edges. Minimal pinpoint bleeding at towards middle of incision on right. Minimal edema. No erythema.   Assessment/Planning: 61 y.o. male is s/p: bilateral AKA 2 Days Post-Op   Febrile this am with mild leukocytosis. Bilateral AKAs incisions clean and are healing well. Start daily dressings.   More lethargic this am. Not answering questions. Squeezing left hand but not right. Did squeeze right hand yesterday. CT head negative for acute stroke yesterday. Last dose of dilaudid last night around 2100.   ABLA: Hgb 7.2. Transfusion per primary.   Creatinine elevated this am at 1.6.   Alvia Grove 08/18/2016 7:36 AM --  Laboratory CBC    Component Value Date/Time   WBC 13.0 (H) 08/18/2016 0225   HGB 7.2 (L) 08/18/2016 0225   HCT 23.1 (L) 08/18/2016 0225   PLT 315 08/18/2016 0225    BMET    Component Value Date/Time   NA 140 08/18/2016 0225   NA 140 06/19/2016   K 4.0 08/18/2016 0225   CL 110 08/18/2016 0225   CO2 23 08/18/2016 0225   GLUCOSE 47 (L) 08/18/2016 0225   BUN 17 08/18/2016 0225   BUN 12 06/19/2016   CREATININE 1.66 (H) 08/18/2016 0225   CREATININE 1.08 06/05/2016 1227   CALCIUM 7.4 (L) 08/18/2016 0225   GFRNONAA 43 (L) 08/18/2016 0225   GFRAA 50 (L) 08/18/2016 0225     COAG Lab Results  Component Value Date   INR 1.10 08/16/2016   INR 1.71 08/13/2016   INR 1.21 05/10/2016   No results found for: PTT  Antibiotics Anti-infectives    Start     Dose/Rate Route Frequency Ordered Stop   08/17/16 2330  Ampicillin-Sulbactam (UNASYN) 3 g in sodium chloride 0.9 % 100 mL IVPB     3 g 200 mL/hr over 30 Minutes Intravenous Every 6 hours 08/17/16 0945     08/16/16 1600  cefUROXime (ZINACEF) 1.5 g in dextrose 5 % 50 mL IVPB     1.5 g 100 mL/hr over 30 Minutes Intravenous To ShortStay Surgical 08/15/16 2119 08/16/16 1244   08/14/16 0800  vancomycin (VANCOCIN) IVPB 1000 mg/200 mL premix  Status:  Discontinued     1,000 mg 200 mL/hr over 60 Minutes Intravenous Every 12 hours 08/14/16 0653 08/17/16 0922   08/13/16 1800  vancomycin (VANCOCIN) 1,250 mg in sodium chloride 0.9 % 250 mL IVPB  Status:  Discontinued     1,250 mg 166.7 mL/hr over 90 Minutes Intravenous Every 24 hours 08/12/16 1845 08/14/16 0653   08/13/16 0000  piperacillin-tazobactam (ZOSYN)  IVPB 3.375 g  Status:  Discontinued     3.375 g 12.5 mL/hr over 240 Minutes Intravenous Every 8 hours 08/12/16 1845 08/12/16 1948   08/13/16 0000  cefTRIAXone (ROCEPHIN) 2 g in dextrose 5 % 50 mL IVPB  Status:  Discontinued     2 g 100 mL/hr over 30 Minutes Intravenous Every 24 hours 08/12/16 1948 08/17/16 0922   08/12/16 2200  metroNIDAZOLE (FLAGYL) IVPB 500 mg  Status:  Discontinued     500 mg 100 mL/hr over 60 Minutes Intravenous Every 8 hours 08/12/16 1948 08/17/16 0922   08/12/16 1815  vancomycin (VANCOCIN) 2,000 mg in sodium chloride 0.9 % 500 mL IVPB     2,000 mg 250 mL/hr over 120 Minutes Intravenous  Once 08/12/16 1804 08/12/16 2128   08/12/16 1800  piperacillin-tazobactam (ZOSYN) IVPB 3.375 g     3.375 g 100 mL/hr over 30 Minutes Intravenous  Once 08/12/16 1748 08/12/16 1924   08/12/16 1800  vancomycin (VANCOCIN) IVPB 1000 mg/200 mL premix  Status:  Discontinued     1,000 mg 200 mL/hr over 60  Minutes Intravenous  Once 08/12/16 1748 08/12/16 [place], PA-C [facility] Office: [phone] Pager: [phone] 08/18/2016 7:36 AM   I have independently interviewed patient and agree with PA assessment and plan above. More alert on my exam today. Dressings in tact to bilateral aka.   Shakyra Mattera C. Donzetta Matters, MD [facility] [place] Office: [phone] Pager: [phone]

## 2016-08-18 NOTE — Progress Notes (Signed)
[place] for Heparin Indication: Hx DVT, severe PAD  Allergies  Allergen Reactions  . Iohexol Hives  . Ivp Dye [Iodinated Diagnostic Agents] Hives    Patient Measurements: Height: 5\' 9"  (175.3 cm) Weight: 214 lb 12.8 oz (97.4 kg) IBW/kg (Calculated) : 70.7  Vital Signs: Temp: 98.7 F (37.1 C) (06/22 2006) Temp Source: Oral (06/22 2006) BP: 126/62 (06/22 2006) Pulse Rate: 86 (06/22 2006)  Labs:  Recent Labs  08/15/16 0526 08/15/16 1116  08/16/16 0358 08/16/16 1406 08/17/16 0258 08/17/16 1703 08/18/16 0225  HGB 8.6*  --   --  9.1* 7.5* 8.3*  --  7.2*  HCT 28.3*  --   --  28.3* 22.0* 26.9*  --  23.1*  PLT 263  --   --  279  --  311  --  315  APTT  --  113*  --   --   --   --   --   --   LABPROT  --   --   --  14.2  --   --   --   --   INR  --   --   --  1.10  --   --   --   --   HEPARINUNFRC  --  0.39  < > 0.43  --   --  0.18* 0.13*  CREATININE 1.13  --   --  0.99  --  1.12  --   --   < > = values in this interval not displayed.  Estimated Creatinine Clearance: 80.8 mL/min (by C-G formula based on SCr of 1.12 mg/dL).  Assessment: 61 yo male with h/o DVT, s/p B/L AKA and Xarelto on hold, for heparin Goal of Therapy:  Heparin level 0.3-0.7 units/ml Monitor platelets by anticoagulation protocol: Yes   Plan:  Increase Heparin 2000 units/hr Check heparin level in 8 hours.  Phillis Knack, PharmD, BCPS 08/18/2016, 3:47 AM      08/18/2016 3:47 AM

## 2016-08-18 NOTE — Progress Notes (Addendum)
Hypoglycemic Event  CBG: 37  Treatment: 3mL D50  Symptoms: None  Follow-up CBG: Time: 0626 CBG Result: 78  Possible Reasons for Event: low PO intake  Comments/MD notified: attempted to have patient eat; only took one bite of applesauce and then refused any more    Jaymes Graff

## 2016-08-18 NOTE — Progress Notes (Signed)
Hypoglycemic Event  CBG: 58  Treatment: D50 IV 25 mL   Symptoms: lethargic   Follow-up CBG: Time:0851  CBG Result:90  Possible Reasons for Event: Inadequate meal intake  Comments/MD notified: Paged MD. Awaiting orders.     [name]

## 2016-08-18 NOTE — Progress Notes (Signed)
Patient ID: [name], male   DOB: 08-Oct-1955, 61 y.o.   MRN: [id number]          [facility]  Date of Admission:  08/12/2016   Total days of antibiotics 7        Day 2 ampicillin sulbactam         ASSESSMENT: Mr. Stollings had bilateral foot infection complicated by polymicrobial bacteremia. He is now undergone bilateral AKA's. Repeat blood cultures are negative. He has been hypoglycemic. He is lethargic but I see no evidence of uncontrolled infection.  PLAN: 1. Continue ampicillin sulbactam 2. Please call me for any infectious disease questions this weekend  Active Problems:   Osteomyelitis of right foot ([place])   Osteomyelitis of left foot ([place])   Enterococcal bacteremia   Bacteremia due to Escherichia coli   Proteus infection   Diabetes type 2, uncontrolled (H[facility])   Hypertension   Chronic diastolic CHF (congestive heart failure) (H[facility])   Peripheral neuropathy   Personal history of DVT (deep vein thrombosis)   History of DVT (deep vein thrombosis)   Coronary artery disease involving native coronary artery of native heart without angina pectoris   Cognitive and behavioral changes   Dysphagia due to recent stroke   AKI (acute kidney injury) ([place])   Pressure injury of skin   History of CVA (cerebrovascular accident)   Goals of care, counseling/discussion   Palliative care by specialist   Status post bilateral above knee amputation ([place])   . amLODipine  10 mg Oral Daily  . atorvastatin  40 mg Oral q1800  . bethanechol  10 mg Oral TID  . carvedilol  25 mg Oral BID WC  . cloNIDine  0.3 mg Oral TID  . docusate sodium  100 mg Oral Daily  . feeding supplement (GLUCERNA SHAKE)  237 mL Oral QID  . feeding supplement (PRO-STAT SUGAR FREE 64)  30 mL Oral BID  . FLUoxetine  10 mg Oral Daily  . insulin aspart  0-15 Units Subcutaneous TID WC  . insulin aspart  0-5 Units Subcutaneous QHS  . insulin glargine  60 Units Subcutaneous QHS  .  isosorbide-hydrALAZINE  2 tablet Oral TID  . mirtazapine  7.5 mg Oral QHS  . pantoprazole  40 mg Oral Daily  . polyethylene glycol  17 g Oral Daily  . QUEtiapine  50 mg Oral Daily  . senna-docusate  1 tablet Oral Daily  . tamsulosin  0.4 mg Oral QPC supper  . zinc sulfate  220 mg Oral Daily   Review of Systems: Review of Systems  Unable to perform ROS: Mental acuity    Allergies  Allergen Reactions  . Iohexol Hives  . Ivp Dye [Iodinated Diagnostic Agents] Hives    OBJECTIVE: Vitals:   08/17/16 1321 08/17/16 2006 08/18/16 0556 08/18/16 0825  BP: (!) 106/50 126/62 (!) 139/49 (!) 138/47  Pulse: 68 86 77 77  Resp: 18 20 20 20   Temp: 97.8 F (36.6 C) 98.7 F (37.1 C) (!) 100.5 F (38.1 C) 99.9 F (37.7 C)  TempSrc: Oral Oral Oral Axillary  SpO2: 100% 100% 99% 100%  Weight:   203 lb 6.4 oz (92.3 kg)   Height:       Body mass index is 30.04 kg/m.  Physical Exam  Constitutional:  He is resting quietly in bed. He opens his eyes to voice but does not respond to questions  Cardiovascular: Normal rate and regular rhythm.   No murmur heard. Pulmonary/Chest: Effort  normal and breath sounds normal. He has no wheezes. He has no rales.  Abdominal: Soft.  Musculoskeletal:  Bilateral AKA wounds noted to look good this morning by Dr. Donzetta Matters.  Skin: No rash noted.    Lab Results Lab Results  Component Value Date   WBC 13.0 (H) 08/18/2016   HGB 7.2 (L) 08/18/2016   HCT 23.1 (L) 08/18/2016   MCV 88.2 08/18/2016   PLT 315 08/18/2016    Lab Results  Component Value Date   CREATININE 1.66 (H) 08/18/2016   BUN 17 08/18/2016   NA 140 08/18/2016   K 4.0 08/18/2016   CL 110 08/18/2016   CO2 23 08/18/2016    Lab Results  Component Value Date   ALT 17 08/12/2016   AST 16 08/12/2016   ALKPHOS 75 08/12/2016   BILITOT 0.3 08/12/2016     Microbiology: Recent Results (from the past 240 hour(s))  Blood culture (routine x 2)     Status: Abnormal   Collection Time: 08/12/16   5:29 PM  Result Value Ref Range Status   Specimen Description BLOOD LEFT ARM  Final   Special Requests BOTTLES DRAWN AEROBIC AND ANAEROBIC BCAV  Final   Culture  Setup Time   Final    GRAM NEGATIVE RODS GRAM POSITIVE COCCI IN PAIRS IN BOTH AEROBIC AND ANAEROBIC BOTTLES CRITICAL RESULT CALLED TO, READ BACK BY AND VERIFIED WITH: T GREEN,PHARMD AT 1151 08/13/16 BY L BENFIELD    Culture (A)  Final    ESCHERICHIA COLI ENTEROCOCCUS FAECALIS SUSCEPTIBILITIES PERFORMED ON PREVIOUS CULTURE WITHIN THE LAST 5 DAYS. Performed at [facility], [place] [address], [place],  [address]    Report Status 08/16/2016 FINAL  Final   Organism ID, Bacteria ESCHERICHIA COLI  Final      Susceptibility   Escherichia coli - MIC*    AMPICILLIN >=32 RESISTANT Resistant     CEFAZOLIN <=4 SENSITIVE Sensitive     CEFEPIME <=1 SENSITIVE Sensitive     CEFTAZIDIME <=1 SENSITIVE Sensitive     CEFTRIAXONE <=1 SENSITIVE Sensitive     CIPROFLOXACIN <=0.25 SENSITIVE Sensitive     GENTAMICIN <=1 SENSITIVE Sensitive     IMIPENEM <=0.25 SENSITIVE Sensitive     TRIMETH/SULFA >=320 RESISTANT Resistant     AMPICILLIN/SULBACTAM 8 SENSITIVE Sensitive     PIP/TAZO <=4 SENSITIVE Sensitive     Extended ESBL NEGATIVE Sensitive     * ESCHERICHIA COLI  Blood Culture ID Panel (Reflexed)     Status: Abnormal   Collection Time: 08/12/16  5:29 PM  Result Value Ref Range Status   Enterococcus species DETECTED (A) NOT DETECTED Final    Comment: CRITICAL RESULT CALLED TO, READ BACK BY AND VERIFIED WITH: T GREEN,PHARMD AT 1151 08/13/16 BY L BENFIELD    Vancomycin resistance NOT DETECTED NOT DETECTED Final   Listeria monocytogenes NOT DETECTED NOT DETECTED Final   Staphylococcus species NOT DETECTED NOT DETECTED Final   Staphylococcus aureus NOT DETECTED NOT DETECTED Final   Streptococcus species NOT DETECTED NOT DETECTED Final   Streptococcus agalactiae NOT DETECTED NOT DETECTED Final   Streptococcus pneumoniae NOT DETECTED  NOT DETECTED Final   Streptococcus pyogenes NOT DETECTED NOT DETECTED Final   Acinetobacter baumannii NOT DETECTED NOT DETECTED Final   Enterobacteriaceae species DETECTED (A) NOT DETECTED Final    Comment: CRITICAL RESULT CALLED TO, READ BACK BY AND VERIFIED WITH: T GREEN,PHARMD AT 1151 08/13/16 BY L BENFIELD    Enterobacter cloacae complex NOT DETECTED NOT DETECTED Final  Escherichia coli DETECTED (A) NOT DETECTED Final    Comment: CRITICAL RESULT CALLED TO, READ BACK BY AND VERIFIED WITH: T GREEN,PHARMD AT 1151 08/13/16 BY L BENFIELD    Klebsiella oxytoca NOT DETECTED NOT DETECTED Final   Klebsiella pneumoniae NOT DETECTED NOT DETECTED Final   Proteus species DETECTED (A) NOT DETECTED Final    Comment: CRITICAL RESULT CALLED TO, READ BACK BY AND VERIFIED WITH: T GREEN,PHARMD AT 1151 08/13/16 BY L BENFIELD    Serratia marcescens NOT DETECTED NOT DETECTED Final   Carbapenem resistance NOT DETECTED NOT DETECTED Final   Haemophilus influenzae NOT DETECTED NOT DETECTED Final   Neisseria meningitidis NOT DETECTED NOT DETECTED Final   Pseudomonas aeruginosa NOT DETECTED NOT DETECTED Final   Candida albicans NOT DETECTED NOT DETECTED Final   Candida glabrata NOT DETECTED NOT DETECTED Final   Candida krusei NOT DETECTED NOT DETECTED Final   Candida parapsilosis NOT DETECTED NOT DETECTED Final   Candida tropicalis NOT DETECTED NOT DETECTED Final    Comment: Performed at [facility], [place] [address], [place], [place] [address]  Blood culture (routine x 2)     Status: Abnormal   Collection Time: 08/12/16  5:53 PM  Result Value Ref Range Status   Specimen Description BLOOD RIGHT HAND  Final   Special Requests   Final    BOTTLES DRAWN AEROBIC AND ANAEROBIC Blood Culture adequate volume   Culture  Setup Time   Final    GRAM NEGATIVE RODS IN BOTH AEROBIC AND ANAEROBIC BOTTLES CRITICAL RESULT CALLED TO, READ BACK BY AND VERIFIED WITH: E WILLIAMSON,PHARMD 1327 08/13/16 BY L BENFIELD GRAM  POSITIVE COCCI IN PAIRS AEROBIC BOTTLE ONLY Performed at [facility], [place] [address], [place], [place] [address]    Culture PROTEUS MIRABILIS ENTEROCOCCUS FAECALIS  (A)  Final   Report Status 08/16/2016 FINAL  Final   Organism ID, Bacteria PROTEUS MIRABILIS  Final   Organism ID, Bacteria ENTEROCOCCUS FAECALIS  Final      Susceptibility   Enterococcus faecalis - MIC*    AMPICILLIN <=2 SENSITIVE Sensitive     VANCOMYCIN 1 SENSITIVE Sensitive     GENTAMICIN SYNERGY RESISTANT Resistant     * ENTEROCOCCUS FAECALIS   Proteus mirabilis - MIC*    AMPICILLIN <=2 SENSITIVE Sensitive     CEFAZOLIN <=4 SENSITIVE Sensitive     CEFEPIME <=1 SENSITIVE Sensitive     CEFTAZIDIME <=1 SENSITIVE Sensitive     CEFTRIAXONE <=1 SENSITIVE Sensitive     CIPROFLOXACIN <=0.25 SENSITIVE Sensitive     GENTAMICIN <=1 SENSITIVE Sensitive     IMIPENEM 2 SENSITIVE Sensitive     TRIMETH/SULFA <=20 SENSITIVE Sensitive     AMPICILLIN/SULBACTAM <=2 SENSITIVE Sensitive     PIP/TAZO <=4 SENSITIVE Sensitive     * PROTEUS MIRABILIS  MRSA PCR Screening     Status: None   Collection Time: 08/13/16  5:50 AM  Result Value Ref Range Status   MRSA by PCR NEGATIVE NEGATIVE Final    Comment:        The GeneXpert MRSA Assay (FDA approved for NASAL specimens only), is one component of a comprehensive MRSA colonization surveillance program. It is not intended to diagnose MRSA infection nor to guide or monitor treatment for MRSA infections.   Culture, blood (routine x 2)     Status: None (Preliminary result)   Collection Time: 08/14/16  5:14 AM  Result Value Ref Range Status   Specimen Description BLOOD RIGHT HAND  Final  Special Requests IN PEDIATRIC BOTTLE Blood Culture adequate volume  Final   Culture   Final    NO GROWTH 4 DAYS Performed at [facility], [place] [address], [place], [place] [address]    Report Status PENDING  Incomplete  Culture, blood (routine x 2)     Status: None (Preliminary  result)   Collection Time: 08/14/16  5:44 AM  Result Value Ref Range Status   Specimen Description BLOOD RIGHT HAND  Final   Special Requests   Final    BOTTLES DRAWN AEROBIC ONLY Blood Culture adequate volume   Culture   Final    NO GROWTH 4 DAYS Performed at [facility], [place] [address], [place], [place] [address]    Report Status PENDING  Incomplete    Michel Bickers, MD [place] [facility] [phone] pager   [phone] cell 08/18/2016, 12:08 PM

## 2016-08-18 NOTE — Progress Notes (Signed)
PROGRESS NOTE    [name]  SNK:[id number] DOB: 1955/07/03 DOA: 08/12/2016 PCP: No primary care provider on file.    Brief Narrative:  61 year old with h/o DM, hypertension, chronic diastolic CHF, DVT, CAD, PVD, CVA with residual dysarthria coming in from SNF for persistent fevers. He was found to have bilateral heel ulcers , which have worsened over the last few weeks. This admission, ABI obtained showed severe / critical disease, vascular surgery consulted and transferred to [facility] for further work up/ intervention. Patient underwent bilateral AKA on 6/21. On the early hours of 6/22, pt had an episode of altered mental status possibly form oversedation from pain meds. CT head was negative for acute stroke.  Today patient continues to be lethargic, barely opening his eyes, following some commands.  Ordered MRI brain without contrast for further evaluation.    Assessment & Plan:   Active Problems:   Diabetes type 2, uncontrolled (HCC)   Hypertension   Chronic diastolic CHF (congestive heart failure) (HCC)   Peripheral neuropathy   Personal history of DVT (deep vein thrombosis)   History of DVT (deep vein thrombosis)   Coronary artery disease involving native coronary artery of native heart without angina pectoris   Cognitive and behavioral changes   Dysphagia due to recent stroke   AKI (acute kidney injury) ([place])   Pressure injury of skin   History of CVA (cerebrovascular accident)   Osteomyelitis of right foot ([place])   Enterococcal bacteremia   Bacteremia due to Escherichia coli   Proteus infection   Osteomyelitis of left foot (HCC)   Goals of care, counseling/discussion   Palliative care by specialist   Status post bilateral above knee amputation ([place])   Sepsis from osteomyelitis from bilateral heel ulcers / Gram negative bacteremia:  Resolved.  D/C  IV vancomycin, rocephin and flagyl, as pt had bilateral AKA,started on unasyn by ID till 7/2 to complete the course.  Febrile  this am, repeat blood cultures ordered. Lactic acid and pro calcitonin will be ordered.  Blood cultures from 6/19 have been negative so far.  Bilateral AKA on 6/21.  Pain control.      PAD:  See above. Resume statin.   CAD: no new complaints.  Resume coreg, statin.    H/o DVT:  Was on xarelto, which was changed to heparin for surgery.  Plan to switch back to xarelto when he is alert enough to take by mouth.    AKI:  Probably from sepsis and pre renal in etiology.  Creatinine up today at 1.6, suspect from surgery and pre renal. Started him on IV fluids.  Monitor creatinine in am.  Stopped toradol and off any nephrotoxins.     H/o Chronic diastolic heart failure:  He appears euvolemic.  Lasix held on admission for sepsis and AKI.  Resume lasix when creatinine improves.    Dysphagia:  Possibly from prior CVA.  SLP eval recommended Dysphagia 2 diet.  No other complaints.   Hypertension:  Well Controlled. Prn hydralazine added.   anemia of chronic disease/ Normocytic anemia/ anemia of blood loss from the surgery.  Hemoglobin dropped to 7.2 , ordered 1 unit of prbc transfusion.  Repeat hemoglobin in am and transfuse to keep hemoglobin greater than 8.    Diabetes Mellitus:  Type 2. A1c 11.  CBG (last 3)   Recent Labs  08/18/16 0625 08/18/16 0821 08/18/16 0851  GLUCAP 78 58* 90    Hypoglycemic over night, hasn't had any food all day  yesterday.  Held the lantus last night, hasn't received any insulin .  D/c pre meal coverage.  Started him on dextrose fluids.      H/o CVA:  With residual weakness.    Acute encephalopathy:  initially thought to be secondary to oversedation from pain meds.  Differential include metabolic from acute renal failure.  CT yesterday was negative for acute intracranial pathology.  MRI brain without contrast ordered as he continues to be lethargic.  Discontinued IV pain medications.    DVT prophylaxis: heparin gtt.  Code  Status: (Full) Family Communication: daughter at bedside.  Disposition Plan: [facility] when stable.  Consultants:   Vascular surgery Dr Donzetta Matters.   Infectious disease Dr Megan Salon  Wound care  Palliative care consult.    Procedures: ABI  BILATERAL AKA on 6/21.  Antimicrobials: rocephin from 6/18, flagyl from 6/17, IV vancomycin from 6/17 til 6. 22. unasyn from 6/22  Subjective: Lethargic, moving all extremities spontaneously.    Objective: Vitals:   08/17/16 1321 08/17/16 2006 08/18/16 0556 08/18/16 0825  BP: (!) 106/50 126/62 (!) 139/49 (!) 138/47  Pulse: 68 86 77 77  Resp: 18 20 20 20   Temp: 97.8 F (36.6 C) 98.7 F (37.1 C) (!) 100.5 F (38.1 C) 99.9 F (37.7 C)  TempSrc: Oral Oral Oral Axillary  SpO2: 100% 100% 99% 100%  Weight:   92.3 kg (203 lb 6.4 oz)   Height:        Intake/Output Summary (Last 24 hours) at 08/18/16 1045 Last data filed at 08/18/16 0960  Gross per 24 hour  Intake              200 ml  Output              850 ml  Net             -650 ml   Filed Weights   08/16/16 1959 08/17/16 0500 08/18/16 0556  Weight: 90.2 kg (198 lb 12.8 oz) 97.4 kg (214 lb 12.8 oz) 92.3 kg (203 lb 6.4 oz)    Examination:  General exam: lethargic,. Does not appear to be in distress.  Respiratory system: decreased air entry at bases, no wheezing or rhonchi.  Cardiovascular system: s1s2 RRR no murmer, no JVD Gastrointestinal system: soft non tender non distended bowel sounds.  Central nervous system: lethargic able to move arms spontaneously. Mumbling.  Extremities: bilateral AKA. Dressings on , no drainage.  Skin: see above.  Psychiatry: letharigc, couldn't be assessed.     Data Reviewed: I have personally reviewed following labs and imaging studies  CBC:  Recent Labs Lab 08/12/16 1724 08/13/16 0434 08/14/16 0514 08/15/16 0526 08/16/16 0358 08/16/16 1406 08/17/16 0258 08/18/16 0225  WBC 8.7 9.6 9.6 7.5 9.7  --  13.7* 13.0*  NEUTROABS 6.9 6.6  --    --   --   --   --   --   HGB 9.8* 9.3* 9.8* 8.6* 9.1* 7.5* 8.3* 7.2*  HCT 30.5* 29.5* 29.4* 28.3* 28.3* 22.0* 26.9* 23.1*  MCV 84.5 83.6 83.8 87.6 86.5  --  85.9 88.2  PLT 263 240 282 263 279  --  311 454   Basic Metabolic Panel:  Recent Labs Lab 08/14/16 0514 08/15/16 0526 08/16/16 0358 08/16/16 1406 08/17/16 0258 08/18/16 0225  NA 138 135 136 141 137 140  K 3.6 4.1 4.4 4.2 4.5 4.0  CL 105 104 105  --  105 110  CO2 25 23 23   --  25 23  GLUCOSE 184* 305* 185* 110* 203* 47*  BUN 18 19 19   --  15 17  CREATININE 0.97 1.13 0.99  --  1.12 1.66*  CALCIUM 8.4* 8.2* 8.5*  --  8.0* 7.4*   GFR: Estimated Creatinine Clearance: 53.1 mL/min (A) (by C-G formula based on SCr of 1.66 mg/dL (H)). Liver Function Tests:  Recent Labs Lab 08/12/16 1724  AST 16  ALT 17  ALKPHOS 75  BILITOT 0.3  PROT 7.5  ALBUMIN 2.5*   No results for input(s): LIPASE, AMYLASE in the last 168 hours. No results for input(s): AMMONIA in the last 168 hours. Coagulation Profile:  Recent Labs Lab 08/13/16 1203 08/16/16 0358  INR 1.71 1.10   Cardiac Enzymes: No results for input(s): CKTOTAL, CKMB, CKMBINDEX, TROPONINI in the last 168 hours. BNP (last 3 results) No results for input(s): PROBNP in the last 8760 hours. HbA1C:  Recent Labs  08/15/16 1116  HGBA1C 8.3*   CBG:  Recent Labs Lab 08/17/16 2112 08/18/16 0555 08/18/16 0625 08/18/16 0821 08/18/16 0851  GLUCAP 74 37* 78 58* 90   Lipid Profile: No results for input(s): CHOL, HDL, LDLCALC, TRIG, CHOLHDL, LDLDIRECT in the last 72 hours. Thyroid Function Tests: No results for input(s): TSH, T4TOTAL, FREET4, T3FREE, THYROIDAB in the last 72 hours. Anemia Panel: No results for input(s): VITAMINB12, FOLATE, FERRITIN, TIBC, IRON, RETICCTPCT in the last 72 hours. Sepsis Labs:  Recent Labs Lab 08/12/16 1731  LATICACIDVEN 1.30    Recent Results (from the past 240 hour(s))  Blood culture (routine x 2)     Status: Abnormal    Collection Time: 08/12/16  5:29 PM  Result Value Ref Range Status   Specimen Description BLOOD LEFT ARM  Final   Special Requests BOTTLES DRAWN AEROBIC AND ANAEROBIC BCAV  Final   Culture  Setup Time   Final    GRAM NEGATIVE RODS GRAM POSITIVE COCCI IN PAIRS IN BOTH AEROBIC AND ANAEROBIC BOTTLES CRITICAL RESULT CALLED TO, READ BACK BY AND VERIFIED WITH: T GREEN,PHARMD AT 1151 08/13/16 BY L BENFIELD    Culture (A)  Final    ESCHERICHIA COLI ENTEROCOCCUS FAECALIS SUSCEPTIBILITIES PERFORMED ON PREVIOUS CULTURE WITHIN THE LAST 5 DAYS. Performed at [facility], [place] [address], [place], [place] [address]    Report Status 08/16/2016 FINAL  Final   Organism ID, Bacteria ESCHERICHIA COLI  Final      Susceptibility   Escherichia coli - MIC*    AMPICILLIN >=32 RESISTANT Resistant     CEFAZOLIN <=4 SENSITIVE Sensitive     CEFEPIME <=1 SENSITIVE Sensitive     CEFTAZIDIME <=1 SENSITIVE Sensitive     CEFTRIAXONE <=1 SENSITIVE Sensitive     CIPROFLOXACIN <=0.25 SENSITIVE Sensitive     GENTAMICIN <=1 SENSITIVE Sensitive     IMIPENEM <=0.25 SENSITIVE Sensitive     TRIMETH/SULFA >=320 RESISTANT Resistant     AMPICILLIN/SULBACTAM 8 SENSITIVE Sensitive     PIP/TAZO <=4 SENSITIVE Sensitive     Extended ESBL NEGATIVE Sensitive     * ESCHERICHIA COLI  Blood Culture ID Panel (Reflexed)     Status: Abnormal   Collection Time: 08/12/16  5:29 PM  Result Value Ref Range Status   Enterococcus species DETECTED (A) NOT DETECTED Final    Comment: CRITICAL RESULT CALLED TO, READ BACK BY AND VERIFIED WITH: T GREEN,PHARMD AT 1151 08/13/16 BY L BENFIELD    Vancomycin resistance NOT DETECTED NOT DETECTED Final   Listeria monocytogenes NOT DETECTED NOT DETECTED Final   Staphylococcus species  NOT DETECTED NOT DETECTED Final   Staphylococcus aureus NOT DETECTED NOT DETECTED Final   Streptococcus species NOT DETECTED NOT DETECTED Final   Streptococcus agalactiae NOT DETECTED NOT DETECTED Final    Streptococcus pneumoniae NOT DETECTED NOT DETECTED Final   Streptococcus pyogenes NOT DETECTED NOT DETECTED Final   Acinetobacter baumannii NOT DETECTED NOT DETECTED Final   Enterobacteriaceae species DETECTED (A) NOT DETECTED Final    Comment: CRITICAL RESULT CALLED TO, READ BACK BY AND VERIFIED WITH: T GREEN,PHARMD AT 1151 08/13/16 BY L BENFIELD    Enterobacter cloacae complex NOT DETECTED NOT DETECTED Final   Escherichia coli DETECTED (A) NOT DETECTED Final    Comment: CRITICAL RESULT CALLED TO, READ BACK BY AND VERIFIED WITH: T GREEN,PHARMD AT 1151 08/13/16 BY L BENFIELD    Klebsiella oxytoca NOT DETECTED NOT DETECTED Final   Klebsiella pneumoniae NOT DETECTED NOT DETECTED Final   Proteus species DETECTED (A) NOT DETECTED Final    Comment: CRITICAL RESULT CALLED TO, READ BACK BY AND VERIFIED WITH: T GREEN,PHARMD AT 1151 08/13/16 BY L BENFIELD    Serratia marcescens NOT DETECTED NOT DETECTED Final   Carbapenem resistance NOT DETECTED NOT DETECTED Final   Haemophilus influenzae NOT DETECTED NOT DETECTED Final   Neisseria meningitidis NOT DETECTED NOT DETECTED Final   Pseudomonas aeruginosa NOT DETECTED NOT DETECTED Final   Candida albicans NOT DETECTED NOT DETECTED Final   Candida glabrata NOT DETECTED NOT DETECTED Final   Candida krusei NOT DETECTED NOT DETECTED Final   Candida parapsilosis NOT DETECTED NOT DETECTED Final   Candida tropicalis NOT DETECTED NOT DETECTED Final    Comment: Performed at [facility], [place] [address] [place], [place] [address]  Blood culture (routine x 2)     Status: Abnormal   Collection Time: 08/12/16  5:53 PM  Result Value Ref Range Status   Specimen Description BLOOD RIGHT HAND  Final   Special Requests   Final    BOTTLES DRAWN AEROBIC AND ANAEROBIC Blood Culture adequate volume   Culture  Setup Time   Final    GRAM NEGATIVE RODS IN BOTH AEROBIC AND ANAEROBIC BOTTLES CRITICAL RESULT CALLED TO, READ BACK BY AND VERIFIED WITH: E  WILLIAMSON,PHARMD 1327 08/13/16 BY L BENFIELD GRAM POSITIVE COCCI IN PAIRS AEROBIC BOTTLE ONLY Performed at [facility], [place] [address] [place], [place] [address]    Culture PROTEUS MIRABILIS ENTEROCOCCUS FAECALIS  (A)  Final   Report Status 08/16/2016 FINAL  Final   Organism ID, Bacteria PROTEUS MIRABILIS  Final   Organism ID, Bacteria ENTEROCOCCUS FAECALIS  Final      Susceptibility   Enterococcus faecalis - MIC*    AMPICILLIN <=2 SENSITIVE Sensitive     VANCOMYCIN 1 SENSITIVE Sensitive     GENTAMICIN SYNERGY RESISTANT Resistant     * ENTEROCOCCUS FAECALIS   Proteus mirabilis - MIC*    AMPICILLIN <=2 SENSITIVE Sensitive     CEFAZOLIN <=4 SENSITIVE Sensitive     CEFEPIME <=1 SENSITIVE Sensitive     CEFTAZIDIME <=1 SENSITIVE Sensitive     CEFTRIAXONE <=1 SENSITIVE Sensitive     CIPROFLOXACIN <=0.25 SENSITIVE Sensitive     GENTAMICIN <=1 SENSITIVE Sensitive     IMIPENEM 2 SENSITIVE Sensitive     TRIMETH/SULFA <=20 SENSITIVE Sensitive     AMPICILLIN/SULBACTAM <=2 SENSITIVE Sensitive     PIP/TAZO <=4 SENSITIVE Sensitive     * PROTEUS MIRABILIS  MRSA PCR Screening     Status: None   Collection Time: 08/13/16  5:50 AM  Result Value Ref Range Status   MRSA by PCR NEGATIVE NEGATIVE Final    Comment:        The GeneXpert MRSA Assay (FDA approved for NASAL specimens only), is one component of a comprehensive MRSA colonization surveillance program. It is not intended to diagnose MRSA infection nor to guide or monitor treatment for MRSA infections.   Culture, blood (routine x 2)     Status: None (Preliminary result)   Collection Time: 08/14/16  5:14 AM  Result Value Ref Range Status   Specimen Description BLOOD RIGHT HAND  Final   Special Requests IN PEDIATRIC BOTTLE Blood Culture adequate volume  Final   Culture   Final    NO GROWTH 4 DAYS Performed at [facility], [place] [address] [place], [place] [address]    Report Status PENDING  Incomplete  Culture,  blood (routine x 2)     Status: None (Preliminary result)   Collection Time: 08/14/16  5:44 AM  Result Value Ref Range Status   Specimen Description BLOOD RIGHT HAND  Final   Special Requests   Final    BOTTLES DRAWN AEROBIC ONLY Blood Culture adequate volume   Culture   Final    NO GROWTH 4 DAYS Performed at [facility], [place] [address], [place], [place] [address]    Report Status PENDING  Incomplete         Radiology Studies: Ct Head Wo Contrast  Result Date: 08/17/2016 CLINICAL DATA:  Acute on chronic alteration in mental status. Slurred speech. EXAM: CT HEAD WITHOUT CONTRAST TECHNIQUE: Contiguous axial images were obtained from the base of the skull through the vertex without intravenous contrast. COMPARISON:  04/24/2016 FINDINGS: Brain: No acute hemorrhage. Stable degree of atrophy and chronic small vessel ischemia, advanced for age. Remote small lacunar infarct in the left pons. No evidence for acute ischemia. No mass effect or midline shift. No subdural or extra-axial fluid collection. No hydrocephalus. Vascular: Atherosclerosis of skullbase vasculature without hyperdense vessel or abnormal calcification. Skull: No fracture or focal lesion. Sinuses/Orbits: Paranasal sinuses and mastoid air cells are clear. The visualized orbits are unremarkable. Bilateral cataract resection. Other: None. IMPRESSION: 1. No evidence of acute intracranial abnormality. 2. Stable atrophy and chronic small vessel ischemia, advanced for age. Electronically Signed   By: Jeb Levering M.D.   On: 08/17/2016 02:49        Scheduled Meds: . amLODipine  10 mg Oral Daily  . atorvastatin  40 mg Oral q1800  . bethanechol  10 mg Oral TID  . carvedilol  25 mg Oral BID WC  . cloNIDine  0.3 mg Oral TID  . docusate sodium  100 mg Oral Daily  . feeding supplement (GLUCERNA SHAKE)  237 mL Oral QID  . feeding supplement (PRO-STAT SUGAR FREE 64)  30 mL Oral BID  . FLUoxetine  10 mg Oral Daily  . insulin  aspart  0-15 Units Subcutaneous TID WC  . insulin aspart  0-5 Units Subcutaneous QHS  . insulin glargine  60 Units Subcutaneous QHS  . isosorbide-hydrALAZINE  2 tablet Oral TID  . mirtazapine  7.5 mg Oral QHS  . pantoprazole  40 mg Oral Daily  . polyethylene glycol  17 g Oral Daily  . QUEtiapine  50 mg Oral Daily  . senna-docusate  1 tablet Oral Daily  . tamsulosin  0.4 mg Oral QPC supper  . zinc sulfate  220 mg Oral Daily   Continuous Infusions: . sodium  chloride    . sodium chloride    . ampicillin-sulbactam (UNASYN) IV Stopped (08/18/16 0546)  . dextrose 5 % and 0.9% NaCl    . heparin 2,000 Units/hr (08/18/16 0428)  . magnesium sulfate 1 - 4 g bolus IVPB       LOS: 6 days    Time spent: 40 MINUTES.     Hosie Poisson, MD [facility] Pager [phone]  If 7PM-7AM, please contact night-coverage [email or web address] Password [email or web address] 08/18/2016, 10:45 AM

## 2016-08-19 ENCOUNTER — Inpatient Hospital Stay (HOSPITAL_COMMUNITY): Payer: Medicare Other

## 2016-08-19 LAB — CBC
HEMATOCRIT: 30.8 % — AB (ref 39.0–52.0)
Hemoglobin: 9.6 g/dL — ABNORMAL LOW (ref 13.0–17.0)
MCH: 27.7 pg (ref 26.0–34.0)
MCHC: 31.2 g/dL (ref 30.0–36.0)
MCV: 89 fL (ref 78.0–100.0)
PLATELETS: 266 10*3/uL (ref 150–400)
RBC: 3.46 MIL/uL — ABNORMAL LOW (ref 4.22–5.81)
RDW: 15 % (ref 11.5–15.5)
WBC: 12.2 10*3/uL — AB (ref 4.0–10.5)

## 2016-08-19 LAB — BLOOD CULTURE ID PANEL (REFLEXED)
ACINETOBACTER BAUMANNII: NOT DETECTED
CANDIDA ALBICANS: NOT DETECTED
CANDIDA GLABRATA: NOT DETECTED
CANDIDA PARAPSILOSIS: NOT DETECTED
CANDIDA TROPICALIS: NOT DETECTED
Candida krusei: NOT DETECTED
ENTEROBACTER CLOACAE COMPLEX: NOT DETECTED
ENTEROBACTERIACEAE SPECIES: NOT DETECTED
ESCHERICHIA COLI: NOT DETECTED
Enterococcus species: NOT DETECTED
HAEMOPHILUS INFLUENZAE: NOT DETECTED
KLEBSIELLA OXYTOCA: NOT DETECTED
KLEBSIELLA PNEUMONIAE: NOT DETECTED
Listeria monocytogenes: NOT DETECTED
METHICILLIN RESISTANCE: DETECTED — AB
Neisseria meningitidis: NOT DETECTED
PSEUDOMONAS AERUGINOSA: NOT DETECTED
Proteus species: NOT DETECTED
STREPTOCOCCUS PNEUMONIAE: NOT DETECTED
STREPTOCOCCUS PYOGENES: NOT DETECTED
STREPTOCOCCUS SPECIES: NOT DETECTED
Serratia marcescens: NOT DETECTED
Staphylococcus aureus (BCID): NOT DETECTED
Staphylococcus species: DETECTED — AB
Streptococcus agalactiae: NOT DETECTED

## 2016-08-19 LAB — GLUCOSE, CAPILLARY
GLUCOSE-CAPILLARY: 227 mg/dL — AB (ref 65–99)
GLUCOSE-CAPILLARY: 184 mg/dL — AB (ref 65–99)
Glucose-Capillary: 223 mg/dL — ABNORMAL HIGH (ref 65–99)
Glucose-Capillary: 206 mg/dL — ABNORMAL HIGH (ref 65–99)

## 2016-08-19 LAB — BASIC METABOLIC PANEL
Anion gap: 5 (ref 5–15)
BUN: 9 mg/dL (ref 6–20)
CHLORIDE: 110 mmol/L (ref 101–111)
CO2: 22 mmol/L (ref 22–32)
CREATININE: 1.16 mg/dL (ref 0.61–1.24)
Calcium: 7.4 mg/dL — ABNORMAL LOW (ref 8.9–10.3)
Glucose, Bld: 224 mg/dL — ABNORMAL HIGH (ref 65–99)
Potassium: 3.7 mmol/L (ref 3.5–5.1)
SODIUM: 137 mmol/L (ref 135–145)

## 2016-08-19 LAB — CULTURE, BLOOD (ROUTINE X 2)
CULTURE: NO GROWTH
CULTURE: NO GROWTH
SPECIAL REQUESTS: ADEQUATE
Special Requests: ADEQUATE

## 2016-08-19 LAB — SEDIMENTATION RATE: Sed Rate: 130 mm/hr — ABNORMAL HIGH (ref 0–16)

## 2016-08-19 LAB — C-REACTIVE PROTEIN: CRP: 10.6 mg/dL — ABNORMAL HIGH (ref <1.0)

## 2016-08-19 LAB — HEPARIN LEVEL (UNFRACTIONATED)
Heparin Unfractionated: 0.19 IU/mL — ABNORMAL LOW (ref 0.30–0.70)
Heparin Unfractionated: 0.33 IU/mL (ref 0.30–0.70)

## 2016-08-19 MED ORDER — RIVAROXABAN 20 MG PO TABS
20.0000 mg | ORAL_TABLET | Freq: Every day | ORAL | Status: DC
  Administered 2016-08-20 – 2016-08-27 (×8): 20 mg via ORAL
  Filled 2016-08-19 (×8): qty 1

## 2016-08-19 NOTE — Progress Notes (Addendum)
[facility] Progress Note  Subjective  - POD #3  More alert today. But still doesn't answer questions. Follows commands.    Objective Vitals:   08/18/16 2100 08/19/16 0406  BP: (!) 177/71 (!) 198/83  Pulse: 82 (!) 101  Resp: 20 (!) 22  Temp: 99.7 F (37.6 C) 99.2 F (37.3 C)    Intake/Output Summary (Last 24 hours) at 08/19/16 0826 Last data filed at 08/19/16 1062  Gross per 24 hour  Intake           708.33 ml  Output             1700 ml  Net          -991.67 ml   Bilateral AKAs healing well. Left AKA with minimal bleeding towards middle of incision.   Assessment/Planning: 61 y.o. male is s/p: bilateral AKAs 3 Days Post-Op   Amputation sites healing. Continue daily dressing changes. More alert this morning. MR from early this am negative for acute stroke. [place] for [facility]F from vascular standpoint. Will need to f/u in 4 weeks for staple removal.   Alvia Grove 08/19/2016 8:26 AM --  Laboratory CBC    Component Value Date/Time   WBC 12.2 (H) 08/19/2016 0525   HGB 9.6 (L) 08/19/2016 0525   HCT 30.8 (L) 08/19/2016 0525   PLT 266 08/19/2016 0525    BMET    Component Value Date/Time   NA 140 08/18/2016 0225   NA 140 06/19/2016   K 4.0 08/18/2016 0225   CL 110 08/18/2016 0225   CO2 23 08/18/2016 0225   GLUCOSE 47 (L) 08/18/2016 0225   BUN 17 08/18/2016 0225   BUN 12 06/19/2016   CREATININE 1.66 (H) 08/18/2016 0225   CREATININE 1.08 06/05/2016 1227   CALCIUM 7.4 (L) 08/18/2016 0225   GFRNONAA 43 (L) 08/18/2016 0225   GFRAA 50 (L) 08/18/2016 0225    COAG Lab Results  Component Value Date   INR 1.10 08/16/2016   INR 1.71 08/13/2016   INR 1.21 05/10/2016   No results found for: PTT  Antibiotics Anti-infectives    Start     Dose/Rate Route Frequency Ordered Stop   08/17/16 2330  Ampicillin-Sulbactam (UNASYN) 3 g in sodium chloride 0.9 % 100 mL IVPB     3 g 200 mL/hr over 30 Minutes Intravenous Every 6 hours 08/17/16 0945     08/16/16 1600  cefUROXime (ZINACEF) 1.5 g in dextrose 5 % 50 mL IVPB     1.5 g 100 mL/hr over 30 Minutes Intravenous To [facility]Stay [facility] 08/15/16 2119 08/16/16 1244   08/14/16 0800  vancomycin (VANCOCIN) IVPB 1000 mg/200 mL premix  Status:  Discontinued     1,000 mg 200 mL/hr over 60 Minutes Intravenous Every 12 hours 08/14/16 0653 08/17/16 0922   08/13/16 1800  vancomycin (VANCOCIN) 1,250 mg in sodium chloride 0.9 % 250 mL IVPB  Status:  Discontinued     1,250 mg 166.7 mL/hr over 90 Minutes Intravenous Every 24 hours 08/12/16 1845 08/14/16 0653   08/13/16 0000  piperacillin-tazobactam (ZOSYN) IVPB 3.375 g  Status:  Discontinued     3.375 g 12.5 mL/hr over 240 Minutes Intravenous Every 8 hours 08/12/16 1845 08/12/16 1948   08/13/16 0000  cefTRIAXone (ROCEPHIN) 2 g in dextrose 5 % 50 mL IVPB  Status:  Discontinued     2 g 100 mL/hr over 30 Minutes Intravenous Every 24 hours 08/12/16 1948 08/17/16 0922   08/12/16 2200  metroNIDAZOLE (FLAGYL)  IVPB 500 mg  Status:  Discontinued     500 mg 100 mL/hr over 60 Minutes Intravenous Every 8 hours 08/12/16 1948 08/17/16 0922   08/12/16 1815  vancomycin (VANCOCIN) 2,000 mg in sodium chloride 0.9 % 500 mL IVPB     2,000 mg 250 mL/hr over 120 Minutes Intravenous  Once 08/12/16 1804 08/12/16 2128   08/12/16 1800  piperacillin-tazobactam (ZOSYN) IVPB 3.375 g     3.375 g 100 mL/hr over 30 Minutes Intravenous  Once 08/12/16 1748 08/12/16 1924   08/12/16 1800  vancomycin (VANCOCIN) IVPB 1000 mg/200 mL premix  Status:  Discontinued     1,000 mg 200 mL/hr over 60 Minutes Intravenous  Once 08/12/16 1748 08/12/16 [place], PA-C [facility] Office: [phone] Pager: [phone] 08/19/2016 8:26 AM    I have independently interviewed patient and agree with PA assessment and plan above.   Zayd Bonet C. Donzetta Matters, MD [facility] [place] Office: [phone] Pager: [phone]

## 2016-08-19 NOTE — Progress Notes (Signed)
[facility] for Heparin Indication: Hx DVT, severe PAD  Allergies  Allergen Reactions  . Iohexol Hives  . Ivp Dye [Iodinated Diagnostic Agents] Hives    Patient Measurements: Height: 5\' 9"  (175.3 cm) Weight: 200 lb 11.2 oz (91 kg) IBW/kg (Calculated) : 70.7  Vital Signs: Temp: 99.2 F (37.3 C) (06/24 0406) Temp Source: Oral (06/24 0406) BP: 198/83 (06/24 0406) Pulse Rate: 101 (06/24 0406)  Labs:  Recent Labs  08/17/16 0258  08/18/16 0225 08/18/16 1249 08/19/16 0525  HGB 8.3*  --  7.2*  --  9.6*  HCT 26.9*  --  23.1*  --  30.8*  PLT 311  --  315  --  266  HEPARINUNFRC  --   < > 0.13* 0.27* 0.19*  CREATININE 1.12  --  1.66*  --   --   < > = values in this interval not displayed.  Estimated Creatinine Clearance: 52.7 mL/min (A) (by C-G formula based on SCr of 1.66 mg/dL (H)).  Assessment: 61 yo male with h/o DVT, s/p B/L AKA and Xarelto on hold, for heparin as a bridge for surgery 6/21.    Repeat heparin level is subtherapeutic at 0.19. Hemoglobin has improved to 9.6 after a unit of PRBC yesterday. RN reported no notes of bleeding. Head CT and MRI were negative for bleed.  Spoke with RN, she reported that heparin was shut off for MRI around 0100 for at least 30 minutes this morning and patient's lab was drawn at about 0530. Given that patient was almost therapeutic on this rate yesterday, will not increase rate and re-check heparin level in 6 hours.   Goal of Therapy:  Heparin level 0.3-0.7 units/ml Monitor platelets by anticoagulation protocol: Yes   Plan:  - Continue heparin 2100 units/hr IV - Check heparin level in 6 hours - Daily CBC/heparin level - Follow up ability mental status and ability to take Alpha, PharmD Acute Care Pharmacy Resident  Pager: [phone] 08/19/2016

## 2016-08-19 NOTE — Progress Notes (Signed)
PHARMACY - PHYSICIAN COMMUNICATION CRITICAL VALUE ALERT - BLOOD CULTURE IDENTIFICATION (BCID)  Results for orders placed or performed during the hospital encounter of 08/12/16  Blood Culture ID Panel (Reflexed) (Collected: 08/18/2016  1:01 PM)  Result Value Ref Range   Enterococcus species NOT DETECTED NOT DETECTED   Listeria monocytogenes NOT DETECTED NOT DETECTED   Staphylococcus species DETECTED (A) NOT DETECTED   Staphylococcus aureus NOT DETECTED NOT DETECTED   Methicillin resistance DETECTED (A) NOT DETECTED   Streptococcus species NOT DETECTED NOT DETECTED   Streptococcus agalactiae NOT DETECTED NOT DETECTED   Streptococcus pneumoniae NOT DETECTED NOT DETECTED   Streptococcus pyogenes NOT DETECTED NOT DETECTED   Acinetobacter baumannii NOT DETECTED NOT DETECTED   Enterobacteriaceae species NOT DETECTED NOT DETECTED   Enterobacter cloacae complex NOT DETECTED NOT DETECTED   Escherichia coli NOT DETECTED NOT DETECTED   Klebsiella oxytoca NOT DETECTED NOT DETECTED   Klebsiella pneumoniae NOT DETECTED NOT DETECTED   Proteus species NOT DETECTED NOT DETECTED   Serratia marcescens NOT DETECTED NOT DETECTED   Haemophilus influenzae NOT DETECTED NOT DETECTED   Neisseria meningitidis NOT DETECTED NOT DETECTED   Pseudomonas aeruginosa NOT DETECTED NOT DETECTED   Candida albicans NOT DETECTED NOT DETECTED   Candida glabrata NOT DETECTED NOT DETECTED   Candida krusei NOT DETECTED NOT DETECTED   Candida parapsilosis NOT DETECTED NOT DETECTED   Candida tropicalis NOT DETECTED NOT DETECTED    Name of physician (or Provider) Contacted: Dr. Megan Salon  Changes to prescribed antibiotics required: No changes at this time, likely contaminate. Continue Unasyn 3g every 6 hours  Melburn Popper 08/19/2016  12:31 PM

## 2016-08-19 NOTE — Progress Notes (Addendum)
Pt refusing 10 am medication and pain medication. Daughter at bedside - she assisted to try to get the pt to take medications. Will continue to offer. Re-emphasized and re-educated the importance of pain medications to the patient.   Medications administered at 12:15pm  Fritz Pickerel, RN

## 2016-08-19 NOTE — Progress Notes (Addendum)
PROGRESS NOTE    [name]  LFY:[id number] DOB: 10/15/1955 DOA: 08/12/2016 PCP: No primary care provider on file.    Brief Narrative:  61 year old with h/o DM, hypertension, chronic diastolic CHF, DVT, CAD, PVD, CVA with residual dysarthria coming in from SNF for persistent fevers. He was found to have bilateral heel ulcers , which have worsened over the last few weeks. This admission, ABI obtained showed severe / critical disease, vascular surgery consulted and transferred to [facility] for further work up/ intervention. Patient underwent bilateral AKA on 6/21. On the early hours of 6/22, pt had an episode of altered mental status possibly form oversedation from pain meds. CT head was negative for acute stroke.  Today patient continues to be lethargic, barely opening his eyes, following some commands.  Ordered MRI brain without contrast for further evaluation.    Assessment & Plan:   Active Problems:   Diabetes type 2, uncontrolled (HCC)   Hypertension   Chronic diastolic CHF (congestive heart failure) (HCC)   Peripheral neuropathy   Personal history of DVT (deep vein thrombosis)   History of DVT (deep vein thrombosis)   Coronary artery disease involving native coronary artery of native heart without angina pectoris   Cognitive and behavioral changes   Dysphagia due to recent stroke   AKI (acute kidney injury) ([place])   Pressure injury of skin   History of CVA (cerebrovascular accident)   Osteomyelitis of right foot ([place])   Enterococcal bacteremia   Bacteremia due to Escherichia coli   Proteus infection   Osteomyelitis of left foot (HCC)   Goals of care, counseling/discussion   Palliative care by specialist   Status post bilateral above knee amputation ([place])   Sepsis from osteomyelitis from bilateral heel ulcers / Gram negative bacteremia:  Resolved.  D/C  IV vancomycin, rocephin and flagyl, as pt had bilateral AKA, started on unasyn by ID till 7/2 to complete the course.  Febrile  on 6/23, , repeat blood cultures ordered. Lactic acid normal and pro calcitonin at 1.35. CXR is negative. UA is negative.  Blood cultures from 6/19 have been negative so far. Blood cultures from 6/23 showed coag neg staph in one bottle. Possibly a contaminant. ID did not recommend adding vancomycin.  Bilateral AKA on 6/21.  Pain control with oral meds.      PAD:  See above. Resume statin.   CAD: no new complaints.  Resume coreg, statin.    H/o DVT:  Was on xarelto, which was changed to heparin for surgery.  Change to xarelto today.    AKI:  Probably from sepsis and pre renal in etiology.  Creatinine up at 1.6 post surgery, suspect from surgery and pre renal. Started him on IV fluids.  Stopped toradol and off any nephrotoxins.  Repeat BMP pending.     H/o Chronic diastolic heart failure:  He appears euvolemic.  Lasix held on admission for sepsis and AKI.  Resume lasix when creatinine improves.    Dysphagia:  Possibly from prior CVA.  SLP eval recommended Dysphagia 2 diet.  No other complaints.   Hypertension:  Well Controlled. Prn hydralazine added.   anemia of chronic disease/ Normocytic anemia/ anemia of blood loss from the surgery.  Hemoglobin dropped to 7.2 , ordered 1 unit of prbc transfusion. Repeat hemoglobin around 9.6. transfuse to keep hemoglobin greater than 8.    Diabetes Mellitus:  Type 2. A1c 11.  CBG (last 3)   Recent Labs  08/19/16 0628 08/19/16 1118 08/19/16  [place]    Patient was hypoglycemic post surgery,as he was lethargic and couldn't take anything by mouth.   insulin was held and he was started on dextrose fluids.  As cbg's are in 200's, we will decrease the rate of the dextrose fluids and start him on SSI.    H/o CVA:  With residual weakness.    Acute encephalopathy:  initially thought to be secondary to oversedation from pain meds.  Differential include metabolic from acute renal failure.  CT yesterday was  negative for acute intracranial pathology.  MRI brain without contrast ordered showed old strokes. No new stroke.  Discontinued IV pain medications.  He is more awake and taking things by mouth.   Hypertension:  Sub optimally controlled.  Prn hydralazine.  Cut down the IV fluids.    DVT prophylaxis: heparin gtt.  Code Status: (Full) Family Communication: daughter at bedside.  Disposition Plan: [facility] when stable.     Consultants:   Vascular surgery Dr Donzetta Matters.   Infectious disease Dr Megan Salon  Wound care  Palliative care consult.    Procedures: ABI  BILATERAL AKA on 6/21.  Antimicrobials: rocephin from 6/18, flagyl from 6/17, IV vancomycin from 6/17 til 6. 22. unasyn from 6/22  Subjective:  still Lethargic, moving all extremities spontaneously.  He is more alert today and taking meds and eating.    Objective: Vitals:   08/18/16 1701 08/18/16 1915 08/18/16 2100 08/19/16 0406  BP: (!) 160/62 (!) 191/76 (!) 177/71 (!) 198/83  Pulse: 77 85 82 (!) 101  Resp: 18 20 20  (!) 22  Temp: 98.7 F (37.1 C) 99.4 F (37.4 C) 99.7 F (37.6 C) 99.2 F (37.3 C)  TempSrc: Axillary Axillary Axillary Oral  SpO2:  100% 100% 98%  Weight:    91 kg (200 lb 11.2 oz)  Height:        Intake/Output Summary (Last 24 hours) at 08/19/16 1644 Last data filed at 08/19/16 1546  Gross per 24 hour  Intake           608.33 ml  Output             2900 ml  Net         -2291.67 ml   Filed Weights   08/17/16 0500 08/18/16 0556 08/19/16 0406  Weight: 97.4 kg (214 lb 12.8 oz) 92.3 kg (203 lb 6.4 oz) 91 kg (200 lb 11.2 oz)    Examination:  General exam: lethargic,. Does not appear to be in distress.  Respiratory system: decreased air entry at bases, no wheezing or rhonchi.  Cardiovascular system: s1s2 RRR no murmer, no JVD Gastrointestinal system: soft NT ND bs+ Central nervous system: lethargic able to move arms spontaneously.  Extremities: bilateral AKA. Dressings on.  Skin: see  above.  Psychiatry: letharigc, but appears comfortable.     Data Reviewed: I have personally reviewed following labs and imaging studies  CBC:  Recent Labs Lab 08/12/16 1724 08/13/16 0434  08/15/16 0526 08/16/16 0358 08/16/16 1406 08/17/16 0258 08/18/16 0225 08/19/16 0525  WBC 8.7 9.6  < > 7.5 9.7  --  13.7* 13.0* 12.2*  NEUTROABS 6.9 6.6  --   --   --   --   --   --   --   HGB 9.8* 9.3*  < > 8.6* 9.1* 7.5* 8.3* 7.2* 9.6*  HCT 30.5* 29.5*  < > 28.3* 28.3* 22.0* 26.9* 23.1* 30.8*  MCV 84.5 83.6  < > 87.6 86.5  --  85.9 88.2 89.0  PLT 263 240  < > 263 279  --  311 315 266  < > = values in this interval not displayed. Basic Metabolic Panel:  Recent Labs Lab 08/14/16 0514 08/15/16 0526 08/16/16 0358 08/16/16 1406 08/17/16 0258 08/18/16 0225  NA 138 135 136 141 137 140  K 3.6 4.1 4.4 4.2 4.5 4.0  CL 105 104 105  --  105 110  CO2 25 23 23   --  25 23  GLUCOSE 184* 305* 185* 110* 203* 47*  BUN 18 19 19   --  15 17  CREATININE 0.97 1.13 0.99  --  1.12 1.66*  CALCIUM 8.4* 8.2* 8.5*  --  8.0* 7.4*   GFR: Estimated Creatinine Clearance: 52.7 mL/min (A) (by C-G formula based on SCr of 1.66 mg/dL (H)). Liver Function Tests:  Recent Labs Lab 08/12/16 1724  AST 16  ALT 17  ALKPHOS 75  BILITOT 0.3  PROT 7.5  ALBUMIN 2.5*   No results for input(s): LIPASE, AMYLASE in the last 168 hours.  Recent Labs Lab 08/18/16 1249  AMMONIA 38*   Coagulation Profile:  Recent Labs Lab 08/13/16 1203 08/16/16 0358  INR 1.71 1.10   Cardiac Enzymes: No results for input(s): CKTOTAL, CKMB, CKMBINDEX, TROPONINI in the last 168 hours. BNP (last 3 results) No results for input(s): PROBNP in the last 8760 hours. HbA1C: No results for input(s): HGBA1C in the last 72 hours. CBG:  Recent Labs Lab 08/18/16 1649 08/18/16 2135 08/19/16 0628 08/19/16 1118 08/19/16 1622  GLUCAP 106* 168* 223* 206* 227*   Lipid Profile: No results for input(s): CHOL, HDL, LDLCALC, TRIG,  CHOLHDL, LDLDIRECT in the last 72 hours. Thyroid Function Tests: No results for input(s): TSH, T4TOTAL, FREET4, T3FREE, THYROIDAB in the last 72 hours. Anemia Panel: No results for input(s): VITAMINB12, FOLATE, FERRITIN, TIBC, IRON, RETICCTPCT in the last 72 hours. Sepsis Labs:  Recent Labs Lab 08/12/16 1731 08/18/16 1249  PROCALCITON  --  1.35  LATICACIDVEN 1.30 0.5    Recent Results (from the past 240 hour(s))  Blood culture (routine x 2)     Status: Abnormal   Collection Time: 08/12/16  5:29 PM  Result Value Ref Range Status   Specimen Description BLOOD LEFT ARM  Final   Special Requests BOTTLES DRAWN AEROBIC AND ANAEROBIC BCAV  Final   Culture  Setup Time   Final    GRAM NEGATIVE RODS GRAM POSITIVE COCCI IN PAIRS IN BOTH AEROBIC AND ANAEROBIC BOTTLES CRITICAL RESULT CALLED TO, READ BACK BY AND VERIFIED WITH: T GREEN,PHARMD AT 1151 08/13/16 BY L BENFIELD    Culture (A)  Final    ESCHERICHIA COLI ENTEROCOCCUS FAECALIS SUSCEPTIBILITIES PERFORMED ON PREVIOUS CULTURE WITHIN THE LAST 5 DAYS. Performed at [facility], [place] [address], [place], [place] [address]    Report Status 08/16/2016 FINAL  Final   Organism ID, Bacteria ESCHERICHIA COLI  Final      Susceptibility   Escherichia coli - MIC*    AMPICILLIN >=32 RESISTANT Resistant     CEFAZOLIN <=4 SENSITIVE Sensitive     CEFEPIME <=1 SENSITIVE Sensitive     CEFTAZIDIME <=1 SENSITIVE Sensitive     CEFTRIAXONE <=1 SENSITIVE Sensitive     CIPROFLOXACIN <=0.25 SENSITIVE Sensitive     GENTAMICIN <=1 SENSITIVE Sensitive     IMIPENEM <=0.25 SENSITIVE Sensitive     TRIMETH/SULFA >=320 RESISTANT Resistant     AMPICILLIN/SULBACTAM 8 SENSITIVE Sensitive     PIP/TAZO <=4 SENSITIVE Sensitive  Extended ESBL NEGATIVE Sensitive     * ESCHERICHIA COLI  Blood Culture ID Panel (Reflexed)     Status: Abnormal   Collection Time: 08/12/16  5:29 PM  Result Value Ref Range Status   Enterococcus species DETECTED (A) NOT DETECTED  Final    Comment: CRITICAL RESULT CALLED TO, READ BACK BY AND VERIFIED WITH: T GREEN,PHARMD AT 1151 08/13/16 BY L BENFIELD    Vancomycin resistance NOT DETECTED NOT DETECTED Final   Listeria monocytogenes NOT DETECTED NOT DETECTED Final   Staphylococcus species NOT DETECTED NOT DETECTED Final   Staphylococcus aureus NOT DETECTED NOT DETECTED Final   Streptococcus species NOT DETECTED NOT DETECTED Final   Streptococcus agalactiae NOT DETECTED NOT DETECTED Final   Streptococcus pneumoniae NOT DETECTED NOT DETECTED Final   Streptococcus pyogenes NOT DETECTED NOT DETECTED Final   Acinetobacter baumannii NOT DETECTED NOT DETECTED Final   Enterobacteriaceae species DETECTED (A) NOT DETECTED Final    Comment: CRITICAL RESULT CALLED TO, READ BACK BY AND VERIFIED WITH: T GREEN,PHARMD AT 1151 08/13/16 BY L BENFIELD    Enterobacter cloacae complex NOT DETECTED NOT DETECTED Final   Escherichia coli DETECTED (A) NOT DETECTED Final    Comment: CRITICAL RESULT CALLED TO, READ BACK BY AND VERIFIED WITH: T GREEN,PHARMD AT 1151 08/13/16 BY L BENFIELD    Klebsiella oxytoca NOT DETECTED NOT DETECTED Final   Klebsiella pneumoniae NOT DETECTED NOT DETECTED Final   Proteus species DETECTED (A) NOT DETECTED Final    Comment: CRITICAL RESULT CALLED TO, READ BACK BY AND VERIFIED WITH: T GREEN,PHARMD AT 1151 08/13/16 BY L BENFIELD    Serratia marcescens NOT DETECTED NOT DETECTED Final   Carbapenem resistance NOT DETECTED NOT DETECTED Final   Haemophilus influenzae NOT DETECTED NOT DETECTED Final   Neisseria meningitidis NOT DETECTED NOT DETECTED Final   Pseudomonas aeruginosa NOT DETECTED NOT DETECTED Final   Candida albicans NOT DETECTED NOT DETECTED Final   Candida glabrata NOT DETECTED NOT DETECTED Final   Candida krusei NOT DETECTED NOT DETECTED Final   Candida parapsilosis NOT DETECTED NOT DETECTED Final   Candida tropicalis NOT DETECTED NOT DETECTED Final    Comment: Performed at [place], [place]. [address], [place], [place] [address]  Blood culture (routine x 2)     Status: Abnormal   Collection Time: 08/12/16  5:53 PM  Result Value Ref Range Status   Specimen Description BLOOD RIGHT HAND  Final   Special Requests   Final    BOTTLES DRAWN AEROBIC AND ANAEROBIC Blood Culture adequate volume   Culture  Setup Time   Final    GRAM NEGATIVE RODS IN BOTH AEROBIC AND ANAEROBIC BOTTLES CRITICAL RESULT CALLED TO, READ BACK BY AND VERIFIED WITH: E WILLIAMSON,PHARMD 1327 08/13/16 BY L BENFIELD GRAM POSITIVE COCCI IN PAIRS AEROBIC BOTTLE ONLY Performed at [facility], [place] [address], [place], [place] [address]    Culture PROTEUS MIRABILIS ENTEROCOCCUS FAECALIS  (A)  Final   Report Status 08/16/2016 FINAL  Final   Organism ID, Bacteria PROTEUS MIRABILIS  Final   Organism ID, Bacteria ENTEROCOCCUS FAECALIS  Final      Susceptibility   Enterococcus faecalis - MIC*    AMPICILLIN <=2 SENSITIVE Sensitive     VANCOMYCIN 1 SENSITIVE Sensitive     GENTAMICIN SYNERGY RESISTANT Resistant     * ENTEROCOCCUS FAECALIS   Proteus mirabilis - MIC*    AMPICILLIN <=2 SENSITIVE Sensitive     CEFAZOLIN <=4 SENSITIVE Sensitive  CEFEPIME <=1 SENSITIVE Sensitive     CEFTAZIDIME <=1 SENSITIVE Sensitive     CEFTRIAXONE <=1 SENSITIVE Sensitive     CIPROFLOXACIN <=0.25 SENSITIVE Sensitive     GENTAMICIN <=1 SENSITIVE Sensitive     IMIPENEM 2 SENSITIVE Sensitive     TRIMETH/SULFA <=20 SENSITIVE Sensitive     AMPICILLIN/SULBACTAM <=2 SENSITIVE Sensitive     PIP/TAZO <=4 SENSITIVE Sensitive     * PROTEUS MIRABILIS  MRSA PCR Screening     Status: None   Collection Time: 08/13/16  5:50 AM  Result Value Ref Range Status   MRSA by PCR NEGATIVE NEGATIVE Final    Comment:        The GeneXpert MRSA Assay (FDA approved for NASAL specimens only), is one component of a comprehensive MRSA colonization surveillance program. It is not intended to diagnose MRSA infection nor to guide or monitor  treatment for MRSA infections.   Culture, blood (routine x 2)     Status: None   Collection Time: 08/14/16  5:14 AM  Result Value Ref Range Status   Specimen Description BLOOD RIGHT HAND  Final   Special Requests IN PEDIATRIC BOTTLE Blood Culture adequate volume  Final   Culture   Final    NO GROWTH 5 DAYS Performed at [facility], [place] [address], [place], [place] [address]    Report Status 08/19/2016 FINAL  Final  Culture, blood (routine x 2)     Status: None   Collection Time: 08/14/16  5:44 AM  Result Value Ref Range Status   Specimen Description BLOOD RIGHT HAND  Final   Special Requests   Final    BOTTLES DRAWN AEROBIC ONLY Blood Culture adequate volume   Culture   Final    NO GROWTH 5 DAYS Performed at [facility], [place] [address], [place], [place] [address]    Report Status 08/19/2016 FINAL  Final  Culture, blood (Routine X 2) w Reflex to ID Panel     Status: None (Preliminary result)   Collection Time: 08/18/16 12:49 PM  Result Value Ref Range Status   Specimen Description BLOOD LEFT HAND  Final   Special Requests IN PEDIATRIC BOTTLE Blood Culture adequate volume  Final   Culture NO GROWTH < 24 HOURS  Final   Report Status PENDING  Incomplete  Culture, blood (Routine X 2) w Reflex to ID Panel     Status: None (Preliminary result)   Collection Time: 08/18/16  1:01 PM  Result Value Ref Range Status   Specimen Description BLOOD RIGHT HAND  Final   Special Requests IN PEDIATRIC BOTTLE Blood Culture adequate volume  Final   Culture  Setup Time   Final    GRAM POSITIVE COCCI IN CLUSTERS AEROBIC BOTTLE ONLY CRITICAL RESULT CALLED TO, READ BACK BY AND VERIFIED WITH: A JONES,PHARMD AT 1229 08/19/16 BY L BENFIELD    Culture GRAM POSITIVE COCCI  Final   Report Status PENDING  Incomplete  Blood Culture ID Panel (Reflexed)     Status: Abnormal   Collection Time: 08/18/16  1:01 PM  Result Value Ref Range Status   Enterococcus species NOT DETECTED NOT DETECTED Final    Listeria monocytogenes NOT DETECTED NOT DETECTED Final   Staphylococcus species DETECTED (A) NOT DETECTED Final    Comment: Methicillin (oxacillin) resistant coagulase negative staphylococcus. Possible blood culture contaminant (unless isolated from more than one blood culture draw or clinical case suggests pathogenicity). No antibiotic treatment is indicated for blood  culture contaminants.  CRITICAL RESULT CALLED TO, READ BACK BY AND VERIFIED WITH: A JONES,PHARMD AT 1229 08/19/16 BY L BENFIELD    Staphylococcus aureus NOT DETECTED NOT DETECTED Final   Methicillin resistance DETECTED (A) NOT DETECTED Final    Comment: CRITICAL RESULT CALLED TO, READ BACK BY AND VERIFIED WITH: A JONES,PHARMD AT 1229 08/19/16 BY L BENFIELD    Streptococcus species NOT DETECTED NOT DETECTED Final   Streptococcus agalactiae NOT DETECTED NOT DETECTED Final   Streptococcus pneumoniae NOT DETECTED NOT DETECTED Final   Streptococcus pyogenes NOT DETECTED NOT DETECTED Final   Acinetobacter baumannii NOT DETECTED NOT DETECTED Final   Enterobacteriaceae species NOT DETECTED NOT DETECTED Final   Enterobacter cloacae complex NOT DETECTED NOT DETECTED Final   Escherichia coli NOT DETECTED NOT DETECTED Final   Klebsiella oxytoca NOT DETECTED NOT DETECTED Final   Klebsiella pneumoniae NOT DETECTED NOT DETECTED Final   Proteus species NOT DETECTED NOT DETECTED Final   Serratia marcescens NOT DETECTED NOT DETECTED Final   Haemophilus influenzae NOT DETECTED NOT DETECTED Final   Neisseria meningitidis NOT DETECTED NOT DETECTED Final   Pseudomonas aeruginosa NOT DETECTED NOT DETECTED Final   Candida albicans NOT DETECTED NOT DETECTED Final   Candida glabrata NOT DETECTED NOT DETECTED Final   Candida krusei NOT DETECTED NOT DETECTED Final   Candida parapsilosis NOT DETECTED NOT DETECTED Final   Candida tropicalis NOT DETECTED NOT DETECTED Final         Radiology Studies: Mr Brain Wo Contrast  Result Date:  08/19/2016 CLINICAL DATA:  Altered mental status, possible reaction to pain medication, status post bilateral above knee amputations August 16, 2016. History of hypertension, diabetes, RIGHT vertebral artery occlusion. EXAM: MRI HEAD WITHOUT CONTRAST TECHNIQUE: Multiplanar, multiecho pulse sequences of the brain and surrounding structures were obtained without intravenous contrast. COMPARISON:  CT HEAD August 17, 2016 and MRI of the head March 28, 2016 and MRI of the head March 30, 2016 FINDINGS: BRAIN: No reduced diffusion to suggest acute ischemia. Scattered chronic micro hemorrhages. Moderate ventriculomegaly on the basis of global parenchymal brain volume loss. Multiple old pontine lacunar infarcts. Tiny old cerebellar infarcts. Old bilateral basal ganglia and bilateral thalamus lacunar infarcts. Confluent supratentorial and patchy pontine white matter FLAIR T2 hyperintensities. No midline shift, mass effect or masses. No abnormal extra-axial fluid collections . VASCULAR: Attenuated RIGHT vertebral artery flow void, better characterized on prior MRI. SKULL AND UPPER CERVICAL SPINE: No abnormal sellar expansion. No suspicious calvarial bone marrow signal. Craniocervical junction maintained. SINUSES/ORBITS: Mild paranasal sinus mucosal thickening. Mastoid air cells are well aerated. The included ocular globes and orbital contents are non-suspicious. Status post bilateral ocular lens implants. OTHER: None. IMPRESSION: No acute intracranial process. 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 Moderate to severe chronic small vessel ischemic disease. Moderate parenchymal brain volume loss, advanced for age. Electronically Signed   By: Elon Alas M.D.   On: 08/19/2016 01:52   Dg Chest Port 1 View  Result Date: 08/18/2016 CLINICAL DATA:  Fever. Hx PNA, MI, HTN, diabetes, CHF, nonsmoker. EXAM: PORTABLE CHEST 1 VIEW COMPARISON:  08/12/2016 FINDINGS: Cardiac silhouette is normal in size. No  mediastinal or hilar masses. Lung volumes are low, particularly on the right with there is elevation of right hemidiaphragm. Lungs are clear. No convincing pleural effusion. No pneumothorax. Skeletal structures are grossly intact. IMPRESSION: No active disease. Electronically Signed   By: Lajean Manes M.D.   On: 08/18/2016 14:59        Scheduled Meds: . amLODipine  10 mg Oral Daily  . atorvastatin  40 mg Oral q1800  . bethanechol  10 mg Oral TID  . carvedilol  25 mg Oral BID WC  . cloNIDine  0.3 mg Oral TID  . docusate sodium  100 mg Oral Daily  . feeding supplement (GLUCERNA SHAKE)  237 mL Oral QID  . feeding supplement (PRO-STAT SUGAR FREE 64)  30 mL Oral BID  . FLUoxetine  10 mg Oral Daily  . insulin aspart  0-15 Units Subcutaneous TID WC  . insulin aspart  0-5 Units Subcutaneous QHS  . insulin glargine  60 Units Subcutaneous QHS  . isosorbide-hydrALAZINE  2 tablet Oral TID  . mirtazapine  7.5 mg Oral QHS  . pantoprazole  40 mg Oral Daily  . polyethylene glycol  17 g Oral Daily  . QUEtiapine  50 mg Oral Daily  . senna-docusate  1 tablet Oral Daily  . tamsulosin  0.4 mg Oral QPC supper  . zinc sulfate  220 mg Oral Daily   Continuous Infusions: . sodium chloride    . sodium chloride    . ampicillin-sulbactam (UNASYN) IV Stopped (08/19/16 1138)  . dextrose 5 % and 0.9% NaCl 100 mL/hr (08/19/16 0409)  . heparin 2,100 Units/hr (08/19/16 1631)  . magnesium sulfate 1 - 4 g bolus IVPB       LOS: 7 days    Time spent: 40 MINUTES.     Hosie Poisson, MD [facility] Pager [phone]  If 7PM-7AM, please contact night-coverage [email or web address] Password [facility] 08/19/2016, 4:44 PM

## 2016-08-19 NOTE — Progress Notes (Signed)
[place] for Heparin Indication: Hx DVT, severe PAD  Allergies  Allergen Reactions  . Iohexol Hives  . Ivp Dye [Iodinated Diagnostic Agents] Hives    Patient Measurements: Height: 5\' 9"  (175.3 cm) Weight: 200 lb 11.2 oz (91 kg) IBW/kg (Calculated) : 70.7  Vital Signs: Temp: 99.2 F (37.3 C) (06/24 0406) Temp Source: Oral (06/24 0406) BP: 198/83 (06/24 0406) Pulse Rate: 101 (06/24 0406)  Labs:  Recent Labs  08/17/16 0258  08/18/16 0225 08/18/16 1249 08/19/16 0525 08/19/16 1311  HGB 8.3*  --  7.2*  --  9.6*  --   HCT 26.9*  --  23.1*  --  30.8*  --   PLT 311  --  315  --  266  --   HEPARINUNFRC  --   < > 0.13* 0.27* 0.19* 0.33  CREATININE 1.12  --  1.66*  --   --   --   < > = values in this interval not displayed.  Estimated Creatinine Clearance: 52.7 mL/min (A) (by C-G formula based on SCr of 1.66 mg/dL (H)).  Assessment: 61 yo male with h/o DVT, s/p B/L AKA and Xarelto on hold, for heparin as a bridge for surgery 6/21.    Hemoglobin has improved to 9.6 after a unit of PRBC yesterday. RN reported no notes of bleeding. Head CT and MRI were negative for bleed.  Spoke with RN, she reported that heparin was shut off for MRI around 0100 for at least 30 minutes this morning and patient's lab was drawn at about 0530. Heparin level now 0.33. Will check confirmatory since no therapeutic level yet.   Goal of Therapy:  Heparin level 0.3-0.7 units/ml Monitor platelets by anticoagulation protocol: Yes   Plan:  - Continue heparin 2100 units/hr IV - Check heparin level in 6 hours - Daily CBC/heparin level - Follow up ability mental status and ability to take [place], BS, PharmD Clinical Pharmacy Resident [phone] (Pager) 08/19/2016 2:44 PM

## 2016-08-19 NOTE — Progress Notes (Signed)
Patient ID: [name], male   DOB: [birth year], 61 y.o.   MRN: [id number]          [facility]  Date of Admission:  08/12/2016   Total days of antibiotics 8        Day 3 ampicillin sulbactam         ASSESSMENT: Mr. [name] had bilateral foot infection complicated by polymicrobial bacteremia. He is now undergone bilateral AKA's. Repeat blood cultures were obtained yesterday and one of 2 sets has grown a methicillin-resistant coagulase-negative staph. He is not febrile and his AKA incisions look good. I suspect this is an insignificant contaminant. I do not recommend adding vancomycin at this time.  PLAN: 1. Continue ampicillin sulbactam  Active Problems:   Osteomyelitis of right foot (HCC)   Osteomyelitis of left foot (HCC)   Enterococcal bacteremia   Bacteremia due to Escherichia coli   Proteus infection   Diabetes type 2, uncontrolled (HCC)   Hypertension   Chronic diastolic CHF (congestive heart failure) (HCC)   Peripheral neuropathy   Personal history of DVT (deep vein thrombosis)   History of DVT (deep vein thrombosis)   Coronary artery disease involving native coronary artery of native heart without angina pectoris   Cognitive and behavioral changes   Dysphagia due to recent stroke   AKI (acute kidney injury) ([place])   Pressure injury of skin   History of CVA (cerebrovascular accident)   Goals of care, counseling/discussion   Palliative care by specialist   Status post bilateral above knee amputation ([name])   . amLODipine  10 mg Oral Daily  . atorvastatin  40 mg Oral q1800  . bethanechol  10 mg Oral TID  . carvedilol  25 mg Oral BID WC  . cloNIDine  0.3 mg Oral TID  . docusate sodium  100 mg Oral Daily  . feeding supplement (GLUCERNA SHAKE)  237 mL Oral QID  . feeding supplement (PRO-STAT SUGAR FREE 64)  30 mL Oral BID  . FLUoxetine  10 mg Oral Daily  . insulin aspart  0-15 Units Subcutaneous TID WC  . insulin aspart  0-5 Units  Subcutaneous QHS  . insulin glargine  60 Units Subcutaneous QHS  . isosorbide-hydrALAZINE  2 tablet Oral TID  . mirtazapine  7.5 mg Oral QHS  . pantoprazole  40 mg Oral Daily  . polyethylene glycol  17 g Oral Daily  . QUEtiapine  50 mg Oral Daily  . senna-docusate  1 tablet Oral Daily  . tamsulosin  0.4 mg Oral QPC supper  . zinc sulfate  220 mg Oral Daily   Review of Systems: Review of Systems  Unable to perform ROS: Mental acuity    Allergies  Allergen Reactions  . Iohexol Hives  . Ivp Dye [Iodinated Diagnostic Agents] Hives    OBJECTIVE: Vitals:   08/18/16 1701 08/18/16 1915 08/18/16 2100 08/19/16 0406  BP: (!) 160/62 (!) 191/76 (!) 177/71 (!) 198/83  Pulse: 77 85 82 (!) 101  Resp: 18 20 20  (!) 22  Temp: 98.7 F (37.1 C) 99.4 F (37.4 C) 99.7 F (37.6 C) 99.2 F (37.3 C)  TempSrc: Axillary Axillary Axillary Oral  SpO2:  100% 100% 98%  Weight:    200 lb 11.2 oz (91 kg)  Height:       Body mass index is 29.64 kg/m.  Physical Exam  Constitutional:  He is resting quietly in bed. He does not answer questions.  Cardiovascular: Normal rate and regular rhythm.  No murmur heard. Pulmonary/Chest: Effort normal and breath sounds normal. He has no wheezes. He has no rales.  Abdominal: Soft.  Skin: No rash noted.    Lab Results Lab Results  Component Value Date   WBC 12.2 (H) 08/19/2016   HGB 9.6 (L) 08/19/2016   HCT 30.8 (L) 08/19/2016   MCV 89.0 08/19/2016   PLT 266 08/19/2016    Lab Results  Component Value Date   CREATININE 1.66 (H) 08/18/2016   BUN 17 08/18/2016   NA 140 08/18/2016   K 4.0 08/18/2016   CL 110 08/18/2016   CO2 23 08/18/2016    Lab Results  Component Value Date   ALT 17 08/12/2016   AST 16 08/12/2016   ALKPHOS 75 08/12/2016   BILITOT 0.3 08/12/2016     Microbiology: Recent Results (from the past 240 hour(s))  Blood culture (routine x 2)     Status: Abnormal   Collection Time: 08/12/16  5:29 PM  Result Value Ref Range Status     Specimen Description BLOOD LEFT ARM  Final   Special Requests BOTTLES DRAWN AEROBIC AND ANAEROBIC BCAV  Final   Culture  Setup Time   Final    GRAM NEGATIVE RODS GRAM POSITIVE COCCI IN PAIRS IN BOTH AEROBIC AND ANAEROBIC BOTTLES CRITICAL RESULT CALLED TO, READ BACK BY AND VERIFIED WITH: T GREEN,PHARMD AT 1151 08/13/16 BY L BENFIELD    Culture (A)  Final    ESCHERICHIA COLI ENTEROCOCCUS FAECALIS SUSCEPTIBILITIES PERFORMED ON PREVIOUS CULTURE WITHIN THE LAST 5 DAYS. Performed at [facility], [place] [address], [place], [place] [address]    Report Status 08/16/2016 FINAL  Final   Organism ID, Bacteria ESCHERICHIA COLI  Final      Susceptibility   Escherichia coli - MIC*    AMPICILLIN >=32 RESISTANT Resistant     CEFAZOLIN <=4 SENSITIVE Sensitive     CEFEPIME <=1 SENSITIVE Sensitive     CEFTAZIDIME <=1 SENSITIVE Sensitive     CEFTRIAXONE <=1 SENSITIVE Sensitive     CIPROFLOXACIN <=0.25 SENSITIVE Sensitive     GENTAMICIN <=1 SENSITIVE Sensitive     IMIPENEM <=0.25 SENSITIVE Sensitive     TRIMETH/SULFA >=320 RESISTANT Resistant     AMPICILLIN/SULBACTAM 8 SENSITIVE Sensitive     PIP/TAZO <=4 SENSITIVE Sensitive     Extended ESBL NEGATIVE Sensitive     * ESCHERICHIA COLI  Blood Culture ID Panel (Reflexed)     Status: Abnormal   Collection Time: 08/12/16  5:29 PM  Result Value Ref Range Status   Enterococcus species DETECTED (A) NOT DETECTED Final    Comment: CRITICAL RESULT CALLED TO, READ BACK BY AND VERIFIED WITH: T GREEN,PHARMD AT 1151 08/13/16 BY L BENFIELD    Vancomycin resistance NOT DETECTED NOT DETECTED Final   Listeria monocytogenes NOT DETECTED NOT DETECTED Final   Staphylococcus species NOT DETECTED NOT DETECTED Final   Staphylococcus aureus NOT DETECTED NOT DETECTED Final   Streptococcus species NOT DETECTED NOT DETECTED Final   Streptococcus agalactiae NOT DETECTED NOT DETECTED Final   Streptococcus pneumoniae NOT DETECTED NOT DETECTED Final   Streptococcus  pyogenes NOT DETECTED NOT DETECTED Final   Acinetobacter baumannii NOT DETECTED NOT DETECTED Final   Enterobacteriaceae species DETECTED (A) NOT DETECTED Final    Comment: CRITICAL RESULT CALLED TO, READ BACK BY AND VERIFIED WITH: T GREEN,PHARMD AT 1151 08/13/16 BY L BENFIELD    Enterobacter cloacae complex NOT DETECTED NOT DETECTED Final   Escherichia coli DETECTED (A) NOT DETECTED Final  Comment: CRITICAL RESULT CALLED TO, READ BACK BY AND VERIFIED WITH: T GREEN,PHARMD AT 1151 08/13/16 BY L BENFIELD    Klebsiella oxytoca NOT DETECTED NOT DETECTED Final   Klebsiella pneumoniae NOT DETECTED NOT DETECTED Final   Proteus species DETECTED (A) NOT DETECTED Final    Comment: CRITICAL RESULT CALLED TO, READ BACK BY AND VERIFIED WITH: T GREEN,PHARMD AT 1151 08/13/16 BY L BENFIELD    Serratia marcescens NOT DETECTED NOT DETECTED Final   Carbapenem resistance NOT DETECTED NOT DETECTED Final   Haemophilus influenzae NOT DETECTED NOT DETECTED Final   Neisseria meningitidis NOT DETECTED NOT DETECTED Final   Pseudomonas aeruginosa NOT DETECTED NOT DETECTED Final   Candida albicans NOT DETECTED NOT DETECTED Final   Candida glabrata NOT DETECTED NOT DETECTED Final   Candida krusei NOT DETECTED NOT DETECTED Final   Candida parapsilosis NOT DETECTED NOT DETECTED Final   Candida tropicalis NOT DETECTED NOT DETECTED Final    Comment: Performed at [facility], [place] [address], [place], [place] [address]  Blood culture (routine x 2)     Status: Abnormal   Collection Time: 08/12/16  5:53 PM  Result Value Ref Range Status   Specimen Description BLOOD RIGHT HAND  Final   Special Requests   Final    BOTTLES DRAWN AEROBIC AND ANAEROBIC Blood Culture adequate volume   Culture  Setup Time   Final    GRAM NEGATIVE RODS IN BOTH AEROBIC AND ANAEROBIC BOTTLES CRITICAL RESULT CALLED TO, READ BACK BY AND VERIFIED WITH: E WILLIAMSON,PHARMD 1327 08/13/16 BY L BENFIELD GRAM POSITIVE COCCI IN PAIRS AEROBIC  BOTTLE ONLY Performed at [facility], [place] [address] [place], [place] [address]    Culture PROTEUS MIRABILIS ENTEROCOCCUS FAECALIS  (A)  Final   Report Status 08/16/2016 FINAL  Final   Organism ID, Bacteria PROTEUS MIRABILIS  Final   Organism ID, Bacteria ENTEROCOCCUS FAECALIS  Final      Susceptibility   Enterococcus faecalis - MIC*    AMPICILLIN <=2 SENSITIVE Sensitive     VANCOMYCIN 1 SENSITIVE Sensitive     GENTAMICIN SYNERGY RESISTANT Resistant     * ENTEROCOCCUS FAECALIS   Proteus mirabilis - MIC*    AMPICILLIN <=2 SENSITIVE Sensitive     CEFAZOLIN <=4 SENSITIVE Sensitive     CEFEPIME <=1 SENSITIVE Sensitive     CEFTAZIDIME <=1 SENSITIVE Sensitive     CEFTRIAXONE <=1 SENSITIVE Sensitive     CIPROFLOXACIN <=0.25 SENSITIVE Sensitive     GENTAMICIN <=1 SENSITIVE Sensitive     IMIPENEM 2 SENSITIVE Sensitive     TRIMETH/SULFA <=20 SENSITIVE Sensitive     AMPICILLIN/SULBACTAM <=2 SENSITIVE Sensitive     PIP/TAZO <=4 SENSITIVE Sensitive     * PROTEUS MIRABILIS  MRSA PCR Screening     Status: None   Collection Time: 08/13/16  5:50 AM  Result Value Ref Range Status   MRSA by PCR NEGATIVE NEGATIVE Final    Comment:        The GeneXpert MRSA Assay (FDA approved for NASAL specimens only), is one component of a comprehensive MRSA colonization surveillance program. It is not intended to diagnose MRSA infection nor to guide or monitor treatment for MRSA infections.   Culture, blood (routine x 2)     Status: None   Collection Time: 08/14/16  5:14 AM  Result Value Ref Range Status   Specimen Description BLOOD RIGHT HAND  Final   Special Requests IN PEDIATRIC BOTTLE Blood Culture adequate volume  Final  Culture   Final    NO GROWTH 5 DAYS Performed at [facility], [place] [address], [place], [place] [address]    Report Status 08/19/2016 FINAL  Final  Culture, blood (routine x 2)     Status: None   Collection Time: 08/14/16  5:44 AM  Result Value Ref Range  Status   Specimen Description BLOOD RIGHT HAND  Final   Special Requests   Final    BOTTLES DRAWN AEROBIC ONLY Blood Culture adequate volume   Culture   Final    NO GROWTH 5 DAYS Performed at [facility], [place] [address], [place], [place] [address]    Report Status 08/19/2016 FINAL  Final  Culture, blood (Routine X 2) w Reflex to ID Panel     Status: None (Preliminary result)   Collection Time: 08/18/16 12:49 PM  Result Value Ref Range Status   Specimen Description BLOOD LEFT HAND  Final   Special Requests IN PEDIATRIC BOTTLE Blood Culture adequate volume  Final   Culture NO GROWTH < 24 HOURS  Final   Report Status PENDING  Incomplete  Culture, blood (Routine X 2) w Reflex to ID Panel     Status: None (Preliminary result)   Collection Time: 08/18/16  1:01 PM  Result Value Ref Range Status   Specimen Description BLOOD RIGHT HAND  Final   Special Requests IN PEDIATRIC BOTTLE Blood Culture adequate volume  Final   Culture  Setup Time   Final    GRAM POSITIVE COCCI IN CLUSTERS AEROBIC BOTTLE ONLY CRITICAL RESULT CALLED TO, READ BACK BY AND VERIFIED WITH: A JONES,PHARMD AT 1229 08/19/16 BY L BENFIELD    Culture GRAM POSITIVE COCCI  Final   Report Status PENDING  Incomplete  Blood Culture ID Panel (Reflexed)     Status: Abnormal   Collection Time: 08/18/16  1:01 PM  Result Value Ref Range Status   Enterococcus species NOT DETECTED NOT DETECTED Final   Listeria monocytogenes NOT DETECTED NOT DETECTED Final   Staphylococcus species DETECTED (A) NOT DETECTED Final    Comment: Methicillin (oxacillin) resistant coagulase negative staphylococcus. Possible blood culture contaminant (unless isolated from more than one blood culture draw or clinical case suggests pathogenicity). No antibiotic treatment is indicated for blood  culture contaminants. CRITICAL RESULT CALLED TO, READ BACK BY AND VERIFIED WITH: A JONES,PHARMD AT 1229 08/19/16 BY L BENFIELD    Staphylococcus aureus NOT DETECTED  NOT DETECTED Final   Methicillin resistance DETECTED (A) NOT DETECTED Final    Comment: CRITICAL RESULT CALLED TO, READ BACK BY AND VERIFIED WITH: A JONES,PHARMD AT 1229 08/19/16 BY L BENFIELD    Streptococcus species NOT DETECTED NOT DETECTED Final   Streptococcus agalactiae NOT DETECTED NOT DETECTED Final   Streptococcus pneumoniae NOT DETECTED NOT DETECTED Final   Streptococcus pyogenes NOT DETECTED NOT DETECTED Final   Acinetobacter baumannii NOT DETECTED NOT DETECTED Final   Enterobacteriaceae species NOT DETECTED NOT DETECTED Final   Enterobacter cloacae complex NOT DETECTED NOT DETECTED Final   Escherichia coli NOT DETECTED NOT DETECTED Final   Klebsiella oxytoca NOT DETECTED NOT DETECTED Final   Klebsiella pneumoniae NOT DETECTED NOT DETECTED Final   Proteus species NOT DETECTED NOT DETECTED Final   Serratia marcescens NOT DETECTED NOT DETECTED Final   Haemophilus influenzae NOT DETECTED NOT DETECTED Final   Neisseria meningitidis NOT DETECTED NOT DETECTED Final   Pseudomonas aeruginosa NOT DETECTED NOT DETECTED Final   Candida albicans NOT DETECTED NOT DETECTED Final  Candida glabrata NOT DETECTED NOT DETECTED Final   Candida krusei NOT DETECTED NOT DETECTED Final   Candida parapsilosis NOT DETECTED NOT DETECTED Final   Candida tropicalis NOT DETECTED NOT DETECTED Final    Michel Bickers, MD [facility] [place] [facility] [phone] pager   [phone] cell 08/19/2016, 12:39 PM

## 2016-08-20 ENCOUNTER — Telehealth: Payer: Self-pay | Admitting: Surgery

## 2016-08-20 LAB — TYPE AND SCREEN
ABO/RH(D): B POS
ANTIBODY SCREEN: NEGATIVE
UNIT DIVISION: 0
UNIT DIVISION: 0
Unit division: 0
Unit division: 0

## 2016-08-20 LAB — BPAM RBC
Blood Product Expiration Date: 201806242359
Blood Product Expiration Date: 201806292359
Blood Product Expiration Date: 201807022359
Blood Product Expiration Date: 201807022359
ISSUE DATE / TIME: 201806211510
ISSUE DATE / TIME: 201806222249
ISSUE DATE / TIME: 201806231639
UNIT TYPE AND RH: 7300
UNIT TYPE AND RH: 7300
Unit Type and Rh: 1700
Unit Type and Rh: 7300

## 2016-08-20 LAB — GLUCOSE, CAPILLARY
GLUCOSE-CAPILLARY: 163 mg/dL — AB (ref 65–99)
GLUCOSE-CAPILLARY: 175 mg/dL — AB (ref 65–99)
Glucose-Capillary: 149 mg/dL — ABNORMAL HIGH (ref 65–99)
Glucose-Capillary: 102 mg/dL — ABNORMAL HIGH (ref 65–99)

## 2016-08-20 LAB — C-REACTIVE PROTEIN: CRP: 6.9 mg/dL — AB (ref <1.0)

## 2016-08-20 LAB — PROCALCITONIN: Procalcitonin: 0.52 ng/mL

## 2016-08-20 LAB — CBC
HEMATOCRIT: 26.8 % — AB (ref 39.0–52.0)
Hemoglobin: 8.4 g/dL — ABNORMAL LOW (ref 13.0–17.0)
MCH: 27.5 pg (ref 26.0–34.0)
MCHC: 31.3 g/dL (ref 30.0–36.0)
MCV: 87.6 fL (ref 78.0–100.0)
PLATELETS: 408 10*3/uL — AB (ref 150–400)
RBC: 3.06 MIL/uL — AB (ref 4.22–5.81)
RDW: 14.6 % (ref 11.5–15.5)
WBC: 10.7 10*3/uL — AB (ref 4.0–10.5)

## 2016-08-20 LAB — SEDIMENTATION RATE: Sed Rate: 133 mm/hr — ABNORMAL HIGH (ref 0–16)

## 2016-08-20 NOTE — Telephone Encounter (Signed)
-----   Message from Mena Goes, RN sent at 08/19/2016  7:57 PM EDT ----- Regarding: 4 weeks   ----- Message ----- From: Alvia Grove, PA-C Sent: 08/19/2016   8:29 AM To: Vvs Charge Pool  S/p bilateral AKA 08/16/16  F/u with Dr. Trula Slade in 4 weeks  Thanks Maudie Mercury

## 2016-08-20 NOTE — Telephone Encounter (Signed)
Schd appt 09/17/16 at 3:00. Spoke to sching at [employer] and lm on hm#.

## 2016-08-20 NOTE — Care Management Important Message (Signed)
Important Message  Patient Details  Name: [name] MRN: [id number] Date of Birth: 25-Mar-1955   Medicare Important Message Given:  Yes    Nathen May 08/20/2016, 10:55 AM

## 2016-08-20 NOTE — Progress Notes (Signed)
    [facility]    Date of Admission:  08/12/2016   Total days of antibiotics 9        Day 4 amp/sub           ID: [name] is a 61 y.o. male with polymicrobial bacteremia due to bilateral LE/DF osteo s/p bilateral AKA Active Problems:   Diabetes type 2, uncontrolled (HCC)   Hypertension   Chronic diastolic CHF (congestive heart failure) (HCC)   Peripheral neuropathy   Personal history of DVT (deep vein thrombosis)   History of DVT (deep vein thrombosis)   Coronary artery disease involving native coronary artery of native heart without angina pectoris   Cognitive and behavioral changes   Dysphagia due to recent stroke   AKI (acute kidney injury) ([place])   Pressure injury of skin   History of CVA (cerebrovascular accident)   Osteomyelitis of right foot ([place])   Enterococcal bacteremia   Bacteremia due to Escherichia coli   Proteus infection   Osteomyelitis of left foot (HCC)   Goals of care, counseling/discussion   Palliative care by specialist   Status post bilateral above knee amputation (HCC)  Assessment/Plan: Polymicrobial bacteremia from DfU/osteo = plan to treat for 14 days using amp/sub at current dosing. Use 6/19 as day 1. Thus end on 7/2.  [name] [facility] Cell: [phone] Pager: [phone]  08/20/2016, 12:28 PM

## 2016-08-20 NOTE — Progress Notes (Signed)
Clinical Social Worker spoke to patients daughter [name] via phone. [name] stated that she has been working with [employer] [name]) to get patients balance paid down. [name] stated that the patient has a balance of over [phone],000 and at this moment the facility has reached out to the president of the facility to see if they are able to take patient back with such a high balance. CSW spoke with [name] about other options and [name] stated she ill think about looking into a private duty list in case patient discharges home. CSW faxed over a copy of private duty list to patients daughter to look over. CSW continues to follow patient/family for support and for a safe disposition.  Rhea Pink, MSW,  [place]

## 2016-08-20 NOTE — Progress Notes (Signed)
PROGRESS NOTE    [name]  IRJ:[id number] DOB: [birth year] DOA: 08/12/2016 PCP: No primary care provider on file.    Brief Narrative:  61 year old with h/o DM, hypertension, chronic diastolic CHF, DVT, CAD, PVD, CVA with residual dysarthria coming in from SNF for persistent fevers. He was found to have bilateral heel ulcers , which have worsened over the last few weeks. This admission, ABI obtained showed severe / critical disease, vascular surgery consulted and transferred to [facility] for further work up/ intervention. Patient underwent bilateral AKA on 6/21. On the early hours of 6/22, pt had an episode of altered mental status possibly form oversedation from pain meds. CT head was negative for acute stroke. As continued to be lethargic , MRI of thebrain was also done and it was negative for acute intracranial pathology, he was found to have old strokes and small vessel disease.    Assessment & Plan:   Active Problems:   Diabetes type 2, uncontrolled (HCC)   Hypertension   Chronic diastolic CHF (congestive heart failure) (HCC)   Peripheral neuropathy   Personal history of DVT (deep vein thrombosis)   History of DVT (deep vein thrombosis)   Coronary artery disease involving native coronary artery of native heart without angina pectoris   Cognitive and behavioral changes   Dysphagia due to recent stroke   AKI (acute kidney injury) ([place])   Pressure injury of skin   History of CVA (cerebrovascular accident)   Osteomyelitis of right foot ([place])   Enterococcal bacteremia   Bacteremia due to Escherichia coli   Proteus infection   Osteomyelitis of left foot (HCC)   Goals of care, counseling/discussion   Palliative care by specialist   Status post bilateral above knee amputation ([place])   Sepsis from osteomyelitis from bilateral heel ulcers / Gram negative bacteremia:  Resolved.  D/C  IV vancomycin, rocephin and flagyl, as pt had bilateral AKA, started on unasyn by ID till 7/2 to complete  the course.  Febrile on 6/23, , repeat blood cultures done. Lactic acid normal and pro calcitonin at 1.35. CXR is negative. UA is negative.  . Blood cultures from 6/23 showed coag neg staph in one bottle. Possibly a contaminant. ID did not recommend adding vancomycin.  Bilateral AKA on 6/21.  Pain control with oral meds.      PAD:  See above. Resume statin.   CAD: no new complaints.  Resume coreg, statin.    H/o DVT: on xarelto.   AKI:  Probably from sepsis and pre renal in etiology.  Creatinine up at 1.6 post surgery, suspect from surgery and pre renal. Started him on IV fluids.  Stopped toradol and off any nephrotoxins.  Repeat BMP shows creatinine back to baseline.     H/o Chronic diastolic heart failure:  He appears euvolemic.  Lasix held on admission for sepsis and AKI.  Resume lasix when creatinine improves.    Dysphagia:  Possibly from prior CVA.  SLP eval recommended Dysphagia 2 diet.  No other complaints.     anemia of chronic disease/ Normocytic anemia/ anemia of blood loss from the surgery.  Hemoglobin dropped to 7.2 , ordered 1 unit of prbc transfusion. Repeat hemoglobin around 8.4 transfuse to keep hemoglobin greater than 8.    Diabetes Mellitus:  Type 2. A1c 11.  CBG (last 3)   Recent Labs  08/19/16 2116 08/20/16 0559 08/20/16 1114  GLUCAP 184* 163* 175*    Patient was hypoglycemic post surgery,as he was lethargic and  couldn't take anything by mouth.   insulin was held and he was started on dextrose fluids.  As cbg's are in 200's, we will decrease the rate of the dextrose fluids and start him on SSI.    H/o CVA:  With residual weakness.    Acute encephalopathy:  initially thought to be secondary to oversedation from pain meds.  Differential include metabolic from acute renal failure.  CT of the head without contrast was negative for acute intracranial pathology.  MRI brain without contrast ordered showed old strokes. No new stroke.    Discontinued IV pain medications.  He is more awake and taking things by mouth.   Hypertension:  Sub optimally controlled.  Prn hydralazine.  Cut down the IV fluids.    DVT prophylaxis:xarelto Code Status: (Full) Family Communication: none  at bedside.  Disposition Plan:SNF vs home. Family deciding.     Consultants:   Vascular surgery Dr Donzetta Matters.   Infectious disease Dr Megan Salon  Wound care  Palliative care consult.    Procedures: ABI  BILATERAL AKA on 6/21.  Antimicrobials: rocephin from 6/18, flagyl from 6/17, IV vancomycin from 6/17 til 6. 22. unasyn from 6/22  Subjective:  still some what lethargic, would open eyes briefly and moan.    Objective: Vitals:   08/19/16 0406 08/19/16 2002 08/20/16 0445 08/20/16 1322  BP: (!) 198/83 (!) 177/72 (!) 155/58 (!) 158/71  Pulse: (!) 101 84 79 70  Resp: (!) 22 20 18 17   Temp: 99.2 F (37.3 C) 98.9 F (37.2 C) 98.3 F (36.8 C) 98 F (36.7 C)  TempSrc: Oral Oral Oral Oral  SpO2: 98% 96% 96% 99%  Weight: 91 kg (200 lb 11.2 oz)  91 kg (200 lb 11.2 oz)   Height:        Intake/Output Summary (Last 24 hours) at 08/20/16 1410 Last data filed at 08/20/16 1300  Gross per 24 hour  Intake              550 ml  Output             2851 ml  Net            -2301 ml   Filed Weights   08/18/16 0556 08/19/16 0406 08/20/16 0445  Weight: 92.3 kg (203 lb 6.4 oz) 91 kg (200 lb 11.2 oz) 91 kg (200 lb 11.2 oz)    Examination:  General exam: lethargic,. Does not appear to be in distress.  Respiratory system: decreased air entry at bases, no wheezing or rhonchi.  Cardiovascular system: s1s2 RRR no murmer, no JVD Gastrointestinal system: soft NT ND bowel sounds heard.  Central nervous system: lethargic able to move arms spontaneously.  Extremities: bilateral AKA. Dressings on.  Skin: see above.  Psychiatry: letharigc, but appears comfortable.     Data Reviewed: I have personally reviewed following labs and imaging  studies  CBC:  Recent Labs Lab 08/16/16 0358 08/16/16 1406 08/17/16 0258 08/18/16 0225 08/19/16 0525 08/20/16 0548  WBC 9.7  --  13.7* 13.0* 12.2* 10.7*  HGB 9.1* 7.5* 8.3* 7.2* 9.6* 8.4*  HCT 28.3* 22.0* 26.9* 23.1* 30.8* 26.8*  MCV 86.5  --  85.9 88.2 89.0 87.6  PLT 279  --  311 315 266 938*   Basic Metabolic Panel:  Recent Labs Lab 08/15/16 0526 08/16/16 0358 08/16/16 1406 08/17/16 0258 08/18/16 0225 08/19/16 1745  NA 135 136 141 137 140 137  K 4.1 4.4 4.2 4.5 4.0 3.7  CL 104 105  --  105 110 110  CO2 23 23  --  25 23 22   GLUCOSE 305* 185* 110* 203* 47* 224*  BUN 19 19  --  15 17 9   CREATININE 1.13 0.99  --  1.12 1.66* 1.16  CALCIUM 8.2* 8.5*  --  8.0* 7.4* 7.4*   GFR: Estimated Creatinine Clearance: 75.5 mL/min (by C-G formula based on SCr of 1.16 mg/dL). Liver Function Tests: No results for input(s): AST, ALT, ALKPHOS, BILITOT, PROT, ALBUMIN in the last 168 hours. No results for input(s): LIPASE, AMYLASE in the last 168 hours.  Recent Labs Lab 08/18/16 1249  AMMONIA 38*   Coagulation Profile:  Recent Labs Lab 08/16/16 0358  INR 1.10   Cardiac Enzymes: No results for input(s): CKTOTAL, CKMB, CKMBINDEX, TROPONINI in the last 168 hours. BNP (last 3 results) No results for input(s): PROBNP in the last 8760 hours. HbA1C: No results for input(s): HGBA1C in the last 72 hours. CBG:  Recent Labs Lab 08/19/16 1118 08/19/16 1622 08/19/16 2116 08/20/16 0559 08/20/16 1114  GLUCAP 206* 227* 184* 163* 175*   Lipid Profile: No results for input(s): CHOL, HDL, LDLCALC, TRIG, CHOLHDL, LDLDIRECT in the last 72 hours. Thyroid Function Tests: No results for input(s): TSH, T4TOTAL, FREET4, T3FREE, THYROIDAB in the last 72 hours. Anemia Panel: No results for input(s): VITAMINB12, FOLATE, FERRITIN, TIBC, IRON, RETICCTPCT in the last 72 hours. Sepsis Labs:  Recent Labs Lab 08/18/16 1249 08/20/16 0548  PROCALCITON 1.35 0.52  LATICACIDVEN 0.5  --      Recent Results (from the past 240 hour(s))  Blood culture (routine x 2)     Status: Abnormal   Collection Time: 08/12/16  5:29 PM  Result Value Ref Range Status   Specimen Description BLOOD LEFT ARM  Final   Special Requests BOTTLES DRAWN AEROBIC AND ANAEROBIC BCAV  Final   Culture  Setup Time   Final    GRAM NEGATIVE RODS GRAM POSITIVE COCCI IN PAIRS IN BOTH AEROBIC AND ANAEROBIC BOTTLES CRITICAL RESULT CALLED TO, READ BACK BY AND VERIFIED WITH: T GREEN,PHARMD AT 1151 08/13/16 BY L BENFIELD    Culture (A)  Final    ESCHERICHIA COLI ENTEROCOCCUS FAECALIS SUSCEPTIBILITIES PERFORMED ON PREVIOUS CULTURE WITHIN THE LAST 5 DAYS. Performed at [facility], [place] [address] [place], [place] [address]    Report Status 08/16/2016 FINAL  Final   Organism ID, Bacteria ESCHERICHIA COLI  Final      Susceptibility   Escherichia coli - MIC*    AMPICILLIN >=32 RESISTANT Resistant     CEFAZOLIN <=4 SENSITIVE Sensitive     CEFEPIME <=1 SENSITIVE Sensitive     CEFTAZIDIME <=1 SENSITIVE Sensitive     CEFTRIAXONE <=1 SENSITIVE Sensitive     CIPROFLOXACIN <=0.25 SENSITIVE Sensitive     GENTAMICIN <=1 SENSITIVE Sensitive     IMIPENEM <=0.25 SENSITIVE Sensitive     TRIMETH/SULFA >=320 RESISTANT Resistant     AMPICILLIN/SULBACTAM 8 SENSITIVE Sensitive     PIP/TAZO <=4 SENSITIVE Sensitive     Extended ESBL NEGATIVE Sensitive     * ESCHERICHIA COLI  Blood Culture ID Panel (Reflexed)     Status: Abnormal   Collection Time: 08/12/16  5:29 PM  Result Value Ref Range Status   Enterococcus species DETECTED (A) NOT DETECTED Final    Comment: CRITICAL RESULT CALLED TO, READ BACK BY AND VERIFIED WITH: T GREEN,PHARMD AT 1151 08/13/16 BY L BENFIELD    Vancomycin resistance NOT DETECTED NOT DETECTED Final   Listeria monocytogenes NOT DETECTED  NOT DETECTED Final   Staphylococcus species NOT DETECTED NOT DETECTED Final   Staphylococcus aureus NOT DETECTED NOT DETECTED Final   Streptococcus species NOT  DETECTED NOT DETECTED Final   Streptococcus agalactiae NOT DETECTED NOT DETECTED Final   Streptococcus pneumoniae NOT DETECTED NOT DETECTED Final   Streptococcus pyogenes NOT DETECTED NOT DETECTED Final   Acinetobacter baumannii NOT DETECTED NOT DETECTED Final   Enterobacteriaceae species DETECTED (A) NOT DETECTED Final    Comment: CRITICAL RESULT CALLED TO, READ BACK BY AND VERIFIED WITH: T GREEN,PHARMD AT 1151 08/13/16 BY L BENFIELD    Enterobacter cloacae complex NOT DETECTED NOT DETECTED Final   Escherichia coli DETECTED (A) NOT DETECTED Final    Comment: CRITICAL RESULT CALLED TO, READ BACK BY AND VERIFIED WITH: T GREEN,PHARMD AT 1151 08/13/16 BY L BENFIELD    Klebsiella oxytoca NOT DETECTED NOT DETECTED Final   Klebsiella pneumoniae NOT DETECTED NOT DETECTED Final   Proteus species DETECTED (A) NOT DETECTED Final    Comment: CRITICAL RESULT CALLED TO, READ BACK BY AND VERIFIED WITH: T GREEN,PHARMD AT 1151 08/13/16 BY L BENFIELD    Serratia marcescens NOT DETECTED NOT DETECTED Final   Carbapenem resistance NOT DETECTED NOT DETECTED Final   Haemophilus influenzae NOT DETECTED NOT DETECTED Final   Neisseria meningitidis NOT DETECTED NOT DETECTED Final   Pseudomonas aeruginosa NOT DETECTED NOT DETECTED Final   Candida albicans NOT DETECTED NOT DETECTED Final   Candida glabrata NOT DETECTED NOT DETECTED Final   Candida krusei NOT DETECTED NOT DETECTED Final   Candida parapsilosis NOT DETECTED NOT DETECTED Final   Candida tropicalis NOT DETECTED NOT DETECTED Final    Comment: Performed at [facility], [place] [address] [place], [place] [address]  Blood culture (routine x 2)     Status: Abnormal   Collection Time: 08/12/16  5:53 PM  Result Value Ref Range Status   Specimen Description BLOOD RIGHT HAND  Final   Special Requests   Final    BOTTLES DRAWN AEROBIC AND ANAEROBIC Blood Culture adequate volume   Culture  Setup Time   Final    GRAM NEGATIVE RODS IN BOTH AEROBIC AND  ANAEROBIC BOTTLES CRITICAL RESULT CALLED TO, READ BACK BY AND VERIFIED WITH: E WILLIAMSON,PHARMD 1327 08/13/16 BY L BENFIELD GRAM POSITIVE COCCI IN PAIRS AEROBIC BOTTLE ONLY Performed at [facility], [place] [address] , [place] [address]    Culture PROTEUS MIRABILIS ENTEROCOCCUS FAECALIS  (A)  Final   Report Status 08/16/2016 FINAL  Final   Organism ID, Bacteria PROTEUS MIRABILIS  Final   Organism ID, Bacteria ENTEROCOCCUS FAECALIS  Final      Susceptibility   Enterococcus faecalis - MIC*    AMPICILLIN <=2 SENSITIVE Sensitive     VANCOMYCIN 1 SENSITIVE Sensitive     GENTAMICIN SYNERGY RESISTANT Resistant     * ENTEROCOCCUS FAECALIS   Proteus mirabilis - MIC*    AMPICILLIN <=2 SENSITIVE Sensitive     CEFAZOLIN <=4 SENSITIVE Sensitive     CEFEPIME <=1 SENSITIVE Sensitive     CEFTAZIDIME <=1 SENSITIVE Sensitive     CEFTRIAXONE <=1 SENSITIVE Sensitive     CIPROFLOXACIN <=0.25 SENSITIVE Sensitive     GENTAMICIN <=1 SENSITIVE Sensitive     IMIPENEM 2 SENSITIVE Sensitive     TRIMETH/SULFA <=20 SENSITIVE Sensitive     AMPICILLIN/SULBACTAM <=2 SENSITIVE Sensitive     PIP/TAZO <=4 SENSITIVE Sensitive     * PROTEUS MIRABILIS  MRSA PCR Screening  Status: None   Collection Time: 08/13/16  5:50 AM  Result Value Ref Range Status   MRSA by PCR NEGATIVE NEGATIVE Final    Comment:        The GeneXpert MRSA Assay (FDA approved for NASAL specimens only), is one component of a comprehensive MRSA colonization surveillance program. It is not intended to diagnose MRSA infection nor to guide or monitor treatment for MRSA infections.   Culture, blood (routine x 2)     Status: None   Collection Time: 08/14/16  5:14 AM  Result Value Ref Range Status   Specimen Description BLOOD RIGHT HAND  Final   Special Requests IN PEDIATRIC BOTTLE Blood Culture adequate volume  Final   Culture   Final    NO GROWTH 5 DAYS Performed at [facility], [place] [address] [place], [place]  [address]    Report Status 08/19/2016 FINAL  Final  Culture, blood (routine x 2)     Status: None   Collection Time: 08/14/16  5:44 AM  Result Value Ref Range Status   Specimen Description BLOOD RIGHT HAND  Final   Special Requests   Final    BOTTLES DRAWN AEROBIC ONLY Blood Culture adequate volume   Culture   Final    NO GROWTH 5 DAYS Performed at [facility], [place] [address], [place],  [address]    Report Status 08/19/2016 FINAL  Final  Culture, blood (Routine X 2) w Reflex to ID Panel     Status: None (Preliminary result)   Collection Time: 08/18/16 12:49 PM  Result Value Ref Range Status   Specimen Description BLOOD LEFT HAND  Final   Special Requests IN PEDIATRIC BOTTLE Blood Culture adequate volume  Final   Culture NO GROWTH 2 DAYS  Final   Report Status PENDING  Incomplete  Culture, blood (Routine X 2) w Reflex to ID Panel     Status: None (Preliminary result)   Collection Time: 08/18/16  1:01 PM  Result Value Ref Range Status   Specimen Description BLOOD RIGHT HAND  Final   Special Requests IN PEDIATRIC BOTTLE Blood Culture adequate volume  Final   Culture  Setup Time   Final    GRAM POSITIVE COCCI IN CLUSTERS AEROBIC BOTTLE ONLY CRITICAL RESULT CALLED TO, READ BACK BY AND VERIFIED WITH: A JONES,PHARMD AT 1229 08/19/16 BY L BENFIELD    Culture GRAM POSITIVE COCCI  Final   Report Status PENDING  Incomplete  Blood Culture ID Panel (Reflexed)     Status: Abnormal   Collection Time: 08/18/16  1:01 PM  Result Value Ref Range Status   Enterococcus species NOT DETECTED NOT DETECTED Final   Listeria monocytogenes NOT DETECTED NOT DETECTED Final   Staphylococcus species DETECTED (A) NOT DETECTED Final    Comment: Methicillin (oxacillin) resistant coagulase negative staphylococcus. Possible blood culture contaminant (unless isolated from more than one blood culture draw or clinical case suggests pathogenicity). No antibiotic treatment is indicated for blood  culture  contaminants. CRITICAL RESULT CALLED TO, READ BACK BY AND VERIFIED WITH: A JONES,PHARMD AT 1229 08/19/16 BY L BENFIELD    Staphylococcus aureus NOT DETECTED NOT DETECTED Final   Methicillin resistance DETECTED (A) NOT DETECTED Final    Comment: CRITICAL RESULT CALLED TO, READ BACK BY AND VERIFIED WITH: A JONES,PHARMD AT 1229 08/19/16 BY L BENFIELD    Streptococcus species NOT DETECTED NOT DETECTED Final   Streptococcus agalactiae NOT DETECTED NOT DETECTED Final   Streptococcus pneumoniae NOT DETECTED  NOT DETECTED Final   Streptococcus pyogenes NOT DETECTED NOT DETECTED Final   Acinetobacter baumannii NOT DETECTED NOT DETECTED Final   Enterobacteriaceae species NOT DETECTED NOT DETECTED Final   Enterobacter cloacae complex NOT DETECTED NOT DETECTED Final   Escherichia coli NOT DETECTED NOT DETECTED Final   Klebsiella oxytoca NOT DETECTED NOT DETECTED Final   Klebsiella pneumoniae NOT DETECTED NOT DETECTED Final   Proteus species NOT DETECTED NOT DETECTED Final   Serratia marcescens NOT DETECTED NOT DETECTED Final   Haemophilus influenzae NOT DETECTED NOT DETECTED Final   Neisseria meningitidis NOT DETECTED NOT DETECTED Final   Pseudomonas aeruginosa NOT DETECTED NOT DETECTED Final   Candida albicans NOT DETECTED NOT DETECTED Final   Candida glabrata NOT DETECTED NOT DETECTED Final   Candida krusei NOT DETECTED NOT DETECTED Final   Candida parapsilosis NOT DETECTED NOT DETECTED Final   Candida tropicalis NOT DETECTED NOT DETECTED Final         Radiology Studies: Mr Brain Wo Contrast  Result Date: 08/19/2016 CLINICAL DATA:  Altered mental status, possible reaction to pain medication, status post bilateral above knee amputations August 16, 2016. History of hypertension, diabetes, RIGHT vertebral artery occlusion. EXAM: MRI HEAD WITHOUT CONTRAST TECHNIQUE: Multiplanar, multiecho pulse sequences of the brain and surrounding structures were obtained without intravenous contrast.  COMPARISON:  CT HEAD August 17, 2016 and MRI of the head March 28, 2016 and MRI of the head March 30, 2016 FINDINGS: BRAIN: No reduced diffusion to suggest acute ischemia. Scattered chronic micro hemorrhages. Moderate ventriculomegaly on the basis of global parenchymal brain volume loss. Multiple old pontine lacunar infarcts. Tiny old cerebellar infarcts. Old bilateral basal ganglia and bilateral thalamus lacunar infarcts. Confluent supratentorial and patchy pontine white matter FLAIR T2 hyperintensities. No midline shift, mass effect or masses. No abnormal extra-axial fluid collections . VASCULAR: Attenuated RIGHT vertebral artery flow void, better characterized on prior MRI. SKULL AND UPPER CERVICAL SPINE: No abnormal sellar expansion. No suspicious calvarial bone marrow signal. Craniocervical junction maintained. SINUSES/ORBITS: Mild paranasal sinus mucosal thickening. Mastoid air cells are well aerated. The included ocular globes and orbital contents are non-suspicious. Status post bilateral ocular lens implants. OTHER: None. IMPRESSION: No acute intracranial process. 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 Moderate to severe chronic small vessel ischemic disease. Moderate parenchymal brain volume loss, advanced for age. Electronically Signed   By: Elon Alas M.D.   On: 08/19/2016 01:52   Dg Chest Port 1 View  Result Date: 08/18/2016 CLINICAL DATA:  Fever. Hx PNA, MI, HTN, diabetes, CHF, nonsmoker. EXAM: PORTABLE CHEST 1 VIEW COMPARISON:  08/12/2016 FINDINGS: Cardiac silhouette is normal in size. No mediastinal or hilar masses. Lung volumes are low, particularly on the right with there is elevation of right hemidiaphragm. Lungs are clear. No convincing pleural effusion. No pneumothorax. Skeletal structures are grossly intact. IMPRESSION: No active disease. Electronically Signed   By: Lajean Manes M.D.   On: 08/18/2016 14:59        Scheduled Meds: . amLODipine  10 mg  Oral Daily  . atorvastatin  40 mg Oral q1800  . bethanechol  10 mg Oral TID  . carvedilol  25 mg Oral BID WC  . cloNIDine  0.3 mg Oral TID  . docusate sodium  100 mg Oral Daily  . feeding supplement (GLUCERNA SHAKE)  237 mL Oral QID  . feeding supplement (PRO-STAT SUGAR FREE 64)  30 mL Oral BID  . FLUoxetine  10 mg Oral Daily  . insulin aspart  0-15 Units Subcutaneous TID WC  . insulin aspart  0-5 Units Subcutaneous QHS  . insulin glargine  60 Units Subcutaneous QHS  . isosorbide-hydrALAZINE  2 tablet Oral TID  . mirtazapine  7.5 mg Oral QHS  . pantoprazole  40 mg Oral Daily  . polyethylene glycol  17 g Oral Daily  . QUEtiapine  50 mg Oral Daily  . rivaroxaban  20 mg Oral Daily  . senna-docusate  1 tablet Oral Daily  . tamsulosin  0.4 mg Oral QPC supper  . zinc sulfate  220 mg Oral Daily   Continuous Infusions: . sodium chloride    . sodium chloride    . ampicillin-sulbactam (UNASYN) IV Stopped (08/20/16 0550)  . dextrose 5 % and 0.9% NaCl 50 mL/hr (08/20/16 0903)  . magnesium sulfate 1 - 4 g bolus IVPB       LOS: 8 days    Time spent: 40 MINUTES.     Hosie Poisson, MD [facility] Pager [phone]  If 7PM-7AM, please contact night-coverage [email or web address] Password [email or web address] 08/20/2016, 2:10 PM

## 2016-08-21 DIAGNOSIS — M86172 Other acute osteomyelitis, left ankle and foot: Secondary | ICD-10-CM

## 2016-08-21 LAB — CBC
HCT: 27.7 % — ABNORMAL LOW (ref 39.0–52.0)
HEMOGLOBIN: 8.5 g/dL — AB (ref 13.0–17.0)
MCH: 26.8 pg (ref 26.0–34.0)
MCHC: 30.7 g/dL (ref 30.0–36.0)
MCV: 87.4 fL (ref 78.0–100.0)
Platelets: 423 10*3/uL — ABNORMAL HIGH (ref 150–400)
RBC: 3.17 MIL/uL — ABNORMAL LOW (ref 4.22–5.81)
RDW: 14.3 % (ref 11.5–15.5)
WBC: 9.8 10*3/uL (ref 4.0–10.5)

## 2016-08-21 LAB — CULTURE, BLOOD (ROUTINE X 2): Special Requests: ADEQUATE

## 2016-08-21 LAB — GLUCOSE, CAPILLARY
GLUCOSE-CAPILLARY: 97 mg/dL (ref 65–99)
GLUCOSE-CAPILLARY: 74 mg/dL (ref 65–99)
Glucose-Capillary: 105 mg/dL — ABNORMAL HIGH (ref 65–99)
Glucose-Capillary: 166 mg/dL — ABNORMAL HIGH (ref 65–99)

## 2016-08-21 LAB — SEDIMENTATION RATE: SED RATE: 134 mm/h — AB (ref 0–16)

## 2016-08-21 LAB — C-REACTIVE PROTEIN: CRP: 4.8 mg/dL — ABNORMAL HIGH (ref <1.0)

## 2016-08-21 MED ORDER — DOCUSATE SODIUM 100 MG PO CAPS: 100.0000 mg | ORAL_CAPSULE | Freq: Every day | ORAL | 0 refills | Status: DC

## 2016-08-21 MED ORDER — RESOURCE THICKENUP CLEAR PO POWD
ORAL | Status: DC | PRN
  Administered 2016-08-21: 01:00:00 via ORAL
  Filled 2016-08-21: qty 125

## 2016-08-21 MED ORDER — GUAIFENESIN-DM 100-10 MG/5ML PO SYRP: 15.0000 mL | ORAL_SOLUTION | ORAL | 0 refills | Status: DC | PRN

## 2016-08-21 MED ORDER — TRAMADOL HCL 50 MG PO TABS: 100.0000 mg | ORAL_TABLET | Freq: Four times a day (QID) | ORAL | 0 refills | Status: DC | PRN

## 2016-08-21 MED ORDER — RESOURCE THICKENUP CLEAR PO POWD: ORAL | Status: DC

## 2016-08-21 MED ORDER — SODIUM CHLORIDE 0.9 % IV SOLN: 3.0000 g | Freq: Four times a day (QID) | INTRAVENOUS | Status: AC

## 2016-08-21 MED ORDER — PANTOPRAZOLE SODIUM 40 MG PO TBEC: 40.0000 mg | DELAYED_RELEASE_TABLET | Freq: Every day | ORAL | Status: DC

## 2016-08-21 NOTE — Progress Notes (Signed)
Clinical Social Worker has been following patient for support and discharge needs. CSW was contacted by [name]) and she stated that family has outstanding balance that needs to be paid off in full before patient is able to return to facility. Patients daughter stated they are unable to pay full amount and [facility] is not willing to take patient back. CSW faxed over a copy of private duty list to daughter yesterday to assist daughter with providing care for patient at home. CSW has attempted to contact daughter several today but has been unable to reach daughter. CSW reached out to patients younger daughter Velna Hatchet) and Velna Hatchet stated she was not aware of SNF balance. Velna Hatchet stated she will contact sister to find out plan for patients disposition and will contact CSW back. CSW as not heard back from either [facility] or [facility]. At this time CSW spoke in detail with CSW Director and CSW was advised to contact APS for possible abandonment.  Rhea Pink, MSW,  [place]

## 2016-08-21 NOTE — Progress Notes (Signed)
Orthopedic Tech Progress Note Patient Details:  [name] 20-Sep-1955 [id number]  Patient ID: DIONTA LARKE, male   DOB: March 24, 1955, 61 y.o.   MRN: [id number]   [place] Pink 08/21/2016, 11:30 AMCalled [employer] for bilateral retention socks.

## 2016-08-21 NOTE — Anesthesia Postprocedure Evaluation (Signed)
Anesthesia Post Note  Patient: [name]  Procedure(s) Performed: Procedure(s) (LRB): BILATERAL AMPUTATION ABOVE KNEE (Bilateral)     Patient location during evaluation: PACU Anesthesia Type: General Level of consciousness: awake Pain management: pain level controlled Vital Signs Assessment: post-procedure vital signs reviewed and stable Respiratory status: spontaneous breathing, nonlabored ventilation, respiratory function stable and patient connected to nasal cannula oxygen Cardiovascular status: blood pressure returned to baseline and stable Postop Assessment: no signs of nausea or vomiting Anesthetic complications: no    Last Vitals:  Vitals:   08/21/16 0539 08/21/16 1335  BP: (!) 170/58 (!) 148/59  Pulse: 75 70  Resp:  17  Temp:  37.1 C    Last Pain:  Vitals:   08/21/16 1335  TempSrc: Axillary  PainSc:                  [name]

## 2016-08-21 NOTE — Discharge Summary (Addendum)
Physician Discharge Summary  SAKETH DAUBERT LOV:[id number] DOB: Feb 02, 1956 DOA: 08/12/2016  PCP: No primary care provider on file.  Admit date: 08/12/2016 Discharge date: 08/22/2016  Admitted From: snf Disposition:  SNF  Recommendations for Outpatient Follow-up:  1. Follow up with PCP in 1-2 weeks 2. Please obtain BMP/CBC in one week 3. Please follow up with Dr Donzetta Matters in 4 weeks with vascular for removal of staples.  4. Please follow up with palliative care service on discharge  5. Please complete antibiotics by July 2nd.  6. Please follow up with Urology as outpatient for voiding trial in one week.     Discharge Condition: stable.  CODE STATUS: full code.  Diet recommendation: Heart Healthy   Brief/Interim Summary: 61 year old with h/o DM, hypertension, chronic diastolic CHF, DVT, CAD, PVD, CVA with residual dysarthria coming in from SNF for persistent fevers. He was found to have bilateral heel ulcers , which have worsened over the last few weeks. This admission, ABI obtained showed severe / critical disease, vascular surgery consulted and transferred to [facility] for further work up/ intervention. Patient underwent bilateral AKA on 6/21. On the early hours of 6/22, pt had an episode of altered mental status possibly form oversedation from pain meds. CT head was negative for acute stroke. As continued to be lethargic , MRI of thebrain was also done and it was negative for acute intracranial pathology, he was found to have old strokes and small vessel disease.   Discharge Diagnoses:  Active Problems:   Diabetes type 2, uncontrolled (HCC)   Hypertension   Chronic diastolic CHF (congestive heart failure) (HCC)   Peripheral neuropathy   Personal history of DVT (deep vein thrombosis)   History of DVT (deep vein thrombosis)   Coronary artery disease involving native coronary artery of native heart without angina pectoris   Cognitive and behavioral changes   Dysphagia due to recent stroke   AKI  (acute kidney injury) ([place])   Pressure injury of skin   History of CVA (cerebrovascular accident)   Osteomyelitis of right foot ([place])   Enterococcal bacteremia   Bacteremia due to Escherichia coli   Proteus infection   Osteomyelitis of left foot (HCC)   Goals of care, counseling/discussion   Palliative care by specialist   Status post bilateral above knee amputation ([place])  Sepsis from osteomyelitis from bilateral heel ulcers / Gram negative bacteremia:  Resolved.  D/C  IV vancomycin, rocephin and flagyl, as pt had bilateral AKA, started on unasyn by ID till 7/2 to complete the course. He will probably need a PICC line for antibiotics when he is ready to go to SNF.  Febrile on 6/23, , repeat blood cultures done. Lactic acid normal and pro calcitonin at 1.35. CXR is negative. UA is negative.  Blood cultures from 6/23 showed coag neg staph in one bottle. Possibly a contaminant. ID did not recommend adding vancomycin.  Bilateral AKA on 6/21.  Pain control with oral meds.      PAD:  See above. Resume statin.   CAD: no new complaints.  Resume coreg, statin.   Urinary Retention:  Foley placed , will need urology follow up for voiding trial in one week post discharge.    H/o DVT: on xarelto.   AKI on stage 2 CKD Probably from sepsis and pre renal in etiology.  Creatinine up at 1.6 post surgery, suspect from surgery and pre renal. Started him on IV fluids, and his creatinine improved.  Stopped toradol and off any  nephrotoxins.      H/o Chronic diastolic heart failure:  He appears euvolemic.  Lasix held on admission for sepsis and AKI.  Resume lasix when creatinine improves.    Dysphagia:  Possibly from prior CVA.  SLP eval recommended Dysphagia 2 diet.  No other complaints.     Anemia of chronic disease/ Normocytic anemia/ anemia of blood loss from the surgery.  Hemoglobin dropped to 7.2 , ordered 1 unit of prbc transfusion. Repeat hemoglobin around  8 transfuse to keep hemoglobin greater than 8.    Diabetes Mellitus: with hyperglycemia with episodes of hypoglycemia during the hospitalization.  Type 2. A1c 11.  CBG (last 3)   CBG (last 3)   Recent Labs  08/20/16 2123 08/21/16 0612 08/21/16 1119  GLUCAP 102* 97 105*    Resume SSI and lantus 60 units daily.    H/o CVA:  With residual weakness on the right upper extremity .    Acute encephalopathy not present on admission:  initially thought to be secondary to oversedation from pain meds.  Differential include metabolic encephalopathy from acute renal failure.  CT of the head without contrast was negative for acute intracranial pathology.  MRI brain without contrast ordered showed old strokes. No new stroke.  Discontinued IV pain medications.  Today he is awake and alert and talking.   Hypertension:  Better controlled.    Discharge Instructions   Allergies as of 08/21/2016      Reactions   Iohexol Hives   Ivp Dye [iodinated Diagnostic Agents] Hives      Medication List    STOP taking these medications   hydrocerin Crea   nitroGLYCERIN 0.4 MG SL tablet Commonly known as:  NITROSTAT     TAKE these medications   acetaminophen 160 MG/5ML suspension Commonly known as:  TYLENOL Take 650 mg by mouth as needed for mild pain. 20.3 ml ( 650 mg total)   albuterol (2.5 MG/3ML) 0.083% nebulizer solution Commonly known as:  PROVENTIL Take 3 mLs (2.5 mg total) by nebulization every 2 (two) hours as needed for shortness of breath.   amLODipine 10 MG tablet Commonly known as:  NORVASC Take 1 tablet (10 mg total) by mouth daily.   Ampicillin-Sulbactam 3 g in sodium chloride 0.9 % 100 mL Inject 3 g into the vein every 6 (six) hours. End date 08/27/2016.    atorvastatin 40 MG tablet Commonly known as:  LIPITOR Take 1 tablet (40 mg total) by mouth daily at 6 PM.   bethanechol 10 MG tablet Commonly known as:  URECHOLINE Take 1 tablet (10 mg total) by mouth 3  (three) times daily.   bisacodyl 10 MG suppository Commonly known as:  DULCOLAX Place 10 mg rectally as needed for moderate constipation.   carvedilol 25 MG tablet Commonly known as:  COREG Take 1 tablet (25 mg total) by mouth 2 (two) times daily with a meal.   cloNIDine 0.3 MG tablet Commonly known as:  CATAPRES Take 1 tablet (0.3 mg total) by mouth 3 (three) times daily.   docusate sodium 100 MG capsule Commonly known as:  COLACE Take 1 capsule (100 mg total) by mouth daily. Start taking on:  08/22/2016   feeding supplement (PRO-STAT SUGAR FREE 64) Liqd Take 30 mLs by mouth 2 (two) times daily.   FLUoxetine 10 MG capsule Commonly known as:  PROZAC Take 1 capsule (10 mg total) by mouth daily.   furosemide 40 MG tablet Commonly known as:  LASIX Take 40 mg by mouth  daily as needed for edema.   GLUCERNA Liqd Take 237 mLs by mouth 4 (four) times daily.   guaiFENesin-dextromethorphan 100-10 MG/5ML syrup Commonly known as:  ROBITUSSIN DM Take 15 mLs by mouth every 4 (four) hours as needed for cough.   hydrocortisone 2.5 % rectal cream Commonly known as:  ANUSOL-HC Place rectally 2 (two) times daily as needed for hemorrhoids or itching.   insulin aspart 100 UNIT/ML FlexPen Commonly known as:  NOVOLOG Inject into the skin 3 (three) times daily with meals. CBG with meals and SSI as follows less than 150 no insulin, 151 - 200 = 3 units, 201 - 250 = 5 units, 251 - 300 = 8 units, 301 - 350 = 11 units, 351 - 400 = 15 units, greater than 400 give 15 units and call MD   isosorbide-hydrALAZINE 20-37.5 MG tablet Commonly known as:  BIDIL Take 2 tablets by mouth 3 (three) times daily.   lidocaine 2 % jelly Commonly known as:  XYLOCAINE Apply topically as needed (Use with in and out catheter).   mirtazapine 7.5 MG tablet Commonly known as:  REMERON Take 1 tablet (7.5 mg total) by mouth at bedtime.   MULTIVITAMIN PO Take 1 tablet by mouth daily.   ondansetron 4 MG  disintegrating tablet Commonly known as:  ZOFRAN-ODT Take 4 mg by mouth every 6 (six) hours as needed for nausea or vomiting.   pantoprazole 40 MG tablet Commonly known as:  PROTONIX Take 1 tablet (40 mg total) by mouth daily. Start taking on:  08/22/2016   polyethylene glycol packet Commonly known as:  MIRALAX / GLYCOLAX Take 17 g by mouth daily.   QUEtiapine 25 MG tablet Commonly known as:  SEROQUEL Take 25 mg by mouth at bedtime.   RA SALINE ENEMA 19-7 GM/118ML Enem Place 1 each rectally as needed (for constipation).   RESOURCE THICKENUP CLEAR Powd As needed.   rivaroxaban 20 MG Tabs tablet Commonly known as:  XARELTO Take 1 tablet (20 mg total) by mouth daily with supper.   senna-docusate 8.6-50 MG tablet Commonly known as:  Senokot-S Take 1 tablet by mouth daily.   tamsulosin 0.4 MG Caps capsule Commonly known as:  FLOMAX Take 1 capsule (0.4 mg total) by mouth daily after supper.   TOUJEO SOLOSTAR 300 UNIT/ML Sopn Generic drug:  Insulin Glargine Inject 60 Units into the skin at bedtime.   traMADol 50 MG tablet Commonly known as:  ULTRAM Take 2 tablets (100 mg total) by mouth every 6 (six) hours as needed for moderate pain.   zinc sulfate 220 (50 Zn) MG capsule Take 220 mg by mouth daily.       Contact information for follow-up providers    Serafina Mitchell, MD In 4 weeks.   Specialties:  Vascular Surgery, Cardiology Why:  Our office will call you to arrange an appointment  Contact information: [place] [address] [phone]            Contact information for after-discharge care    Destination    [facility] .   Specialty:  [place] information: [place] [phone]                 Allergies  Allergen Reactions  . Iohexol Hives  . Ivp Dye [Iodinated Diagnostic Agents] Hives    Consultations:  Vascular surgery Dr Donzetta Matters.    Infectious disease Dr Megan Salon  Wound care  Palliative  care consult.    Procedures/Studies: Dg Chest 2 View  Result Date: 08/12/2016 CLINICAL DATA:  Initial evaluation for acute fever. EXAM: CHEST  2 VIEW COMPARISON:  Prior radiograph and CT from 05/10/2016. FINDINGS: Mild cardiomegaly, stable.  Mediastinal silhouette normal. Lungs hypoinflated. Mild perihilar vascular congestion without overt pulmonary edema. Patchy and linear bibasilar opacities, favored to reflect atelectasis and/ or bronchovascular crowding, although superimposed infiltrate not entirely excluded, particularly at the left lung base. No pleural effusion. No pneumothorax. No acute osseous abnormality. IMPRESSION: 1. Shallow lung inflation with mild patchy and linear bibasilar opacities. Atelectasis/bronchovascular crowding is favored, although superimposed infiltrates could be considered in the correct clinical setting, particularly at the left lung base. 2. Cardiomegaly with mild perihilar vascular congestion without overt pulmonary edema. Electronically Signed   By: Jeannine Boga M.D.   On: 08/12/2016 18:07   Dg Ankle Complete Left  Result Date: 08/12/2016 CLINICAL DATA:  Initial evaluation for soft tissue ulceration at heel. Rule out osteo. EXAM: LEFT ANKLE COMPLETE - 3+ VIEW COMPARISON:  None. FINDINGS: Soft tissue irregularity at the plantar aspect of the heel, consistent with history of ulceration. Few scatter locular is of soft tissue emphysema present within this region due to ulceration. No radiopaque foreign body. No radiographic findings to suggest osteomyelitis. No acute fracture dislocation. Ankle mortise approximated. Vascular calcifications noted within the lower leg. IMPRESSION: 1. Soft tissue irregularity at the heel, compatible with history of ulceration in this region. Few scatter locked fills of soft tissue emphysema related to ulceration without radiographic evidence for osteomyelitis. No radiopaque  foreign body. 2. No acute osseous abnormality about the ankle. Electronically Signed   By: Jeannine Boga M.D.   On: 08/12/2016 18:09   Dg Ankle Complete Right  Result Date: 08/12/2016 CLINICAL DATA:  Initial evaluation for heel ulcer. Rule out osteomyelitis. EXAM: RIGHT ANKLE - COMPLETE 3+ VIEW COMPARISON:  None. FINDINGS: No acute fracture or dislocation.  Ankle mortise approximated. Soft tissue irregularity at overlying the lateral malleolus suspicious for ulceration. There is underlying cortical disruption of the distal fibula, suspicious for possible osteomyelitis. No frank periosteal reaction. No dissecting soft tissue emphysema. No radiopaque foreign body. IMPRESSION: 1. Soft tissue irregularity overlying the lateral malleolus, consistent with ulceration. Underlying cortical erosion suspicious for osteomyelitis. 2. No other acute osseous abnormality about the ankle. Electronically Signed   By: Jeannine Boga M.D.   On: 08/12/2016 18:13   Ct Head Wo Contrast  Result Date: 08/17/2016 CLINICAL DATA:  Acute on chronic alteration in mental status. Slurred speech. EXAM: CT HEAD WITHOUT CONTRAST TECHNIQUE: Contiguous axial images were obtained from the base of the skull through the vertex without intravenous contrast. COMPARISON:  04/24/2016 FINDINGS: Brain: No acute hemorrhage. Stable degree of atrophy and chronic small vessel ischemia, advanced for age. Remote small lacunar infarct in the left pons. No evidence for acute ischemia. No mass effect or midline shift. No subdural or extra-axial fluid collection. No hydrocephalus. Vascular: Atherosclerosis of skullbase vasculature without hyperdense vessel or abnormal calcification. Skull: No fracture or focal lesion. Sinuses/Orbits: Paranasal sinuses and mastoid air cells are clear. The visualized orbits are unremarkable. Bilateral cataract resection. Other: None. IMPRESSION: 1. No evidence of acute intracranial abnormality. 2. Stable atrophy and  chronic small vessel ischemia, advanced for age. Electronically Signed   By: Jeb Levering M.D.   On: 08/17/2016 02:49   Mr Brain Wo Contrast  Result Date: 08/19/2016 CLINICAL DATA:  Altered mental status, possible reaction to pain medication, status post bilateral above knee  amputations August 16, 2016. History of hypertension, diabetes, RIGHT vertebral artery occlusion. EXAM: MRI HEAD WITHOUT CONTRAST TECHNIQUE: Multiplanar, multiecho pulse sequences of the brain and surrounding structures were obtained without intravenous contrast. COMPARISON:  CT HEAD August 17, 2016 and MRI of the head March 28, 2016 and MRI of the head March 30, 2016 FINDINGS: BRAIN: No reduced diffusion to suggest acute ischemia. Scattered chronic micro hemorrhages. Moderate ventriculomegaly on the basis of global parenchymal brain volume loss. Multiple old pontine lacunar infarcts. Tiny old cerebellar infarcts. Old bilateral basal ganglia and bilateral thalamus lacunar infarcts. Confluent supratentorial and patchy pontine white matter FLAIR T2 hyperintensities. No midline shift, mass effect or masses. No abnormal extra-axial fluid collections . VASCULAR: Attenuated RIGHT vertebral artery flow void, better characterized on prior MRI. SKULL AND UPPER CERVICAL SPINE: No abnormal sellar expansion. No suspicious calvarial bone marrow signal. Craniocervical junction maintained. SINUSES/ORBITS: Mild paranasal sinus mucosal thickening. Mastoid air cells are well aerated. The included ocular globes and orbital contents are non-suspicious. Status post bilateral ocular lens implants. OTHER: None. IMPRESSION: No acute intracranial process. 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 Moderate to severe chronic small vessel ischemic disease. Moderate parenchymal brain volume loss, advanced for age. Electronically Signed   By: Elon Alas M.D.   On: 08/19/2016 01:52   Mr Foot Right Wo Contrast  Result Date:  08/13/2016 CLINICAL DATA:  NONHEALING DIABETIC SOFT TISSUE ULCER IN EXAM: MRI OF THE RIGHT HINDFOOT WITHOUT CONTRAST TECHNIQUE: Multiplanar, multisequence MR imaging of the ankle was performed. No intravenous contrast was administered. COMPARISON:  RADIOGRAPHS DATED 08/12/2016 FINDINGS: TENDONS Peroneal: Intact peroneus longus and peroneus brevis tendons. Posteromedial: Intact tibialis posterior, flexor hallucis longus and flexor digitorum longus tendons. Anterior: Intact tibialis anterior, extensor hallucis longus and extensor digitorum longus tendons. Achilles: Intact. Plantar Fascia: Normal. LIGAMENTS Lateral: Intact. Medial: Intact. CARTILAGE Ankle Joint: Small nonspecific ankle joint effusion. No chondral defect. Subtalar Joints/Sinus Tarsi: No joint effusion or chondral defect. Bones: Abnormal edema and erosion of the superficial cortex of the lateral aspect of the lateral malleolus consistent with osteomyelitis. Overlying soft tissue ulceration. There is periosteal reaction extending 4 cm proximal to the tip of the lateral malleolus. Other: Prominent soft tissue ulceration over the lateral malleolus. Suggestion of soft tissue ulceration posterior laterally adjacent to the posterior calcaneus. No underlying osteomyelitis at that site. IMPRESSION: Osteomyelitis of the lateral malleolus of the distal fibula. Electronically Signed   By: Lorriane Shire M.D.   On: 08/13/2016 14:35   Mr Foot Left Wo Contrast  Result Date: 08/13/2016 CLINICAL DATA:  Diabetic patient with a nonhealing ulcer on the left heel. EXAM: MRI OF THE LEFT FOOT WITHOUT CONTRAST TECHNIQUE: Multiplanar, multisequence MR imaging of the ankle was performed. No intravenous contrast was administered. COMPARISON:  Plain films left ankle 08/12/2016. FINDINGS: TENDONS Peroneal: Intact. Posteromedial: Intact. Anterior: Intact. Achilles: Intrasubstance increased T2 signal is seen in the distal most fibers of the Achilles tendon. There is a small  amount fluid in the retrocalcaneal bursa. The tendon is intact. Plantar Fascia: Intact. LIGAMENTS Lateral: Intact. Medial: Intact. CARTILAGE Ankle Joint: Negative. Subtalar Joints/Sinus Tarsi: Negative. Bones: There is intense marrow edema in the posterior 4.5 cm of the calcaneus. Multiple hypointensities within the substance of the inferior calcaneus are likely due to locules of gas. Much milder degree of edema is seen in the more distal calcaneus extending to the calcaneocuboid joint. Edema is seen about the articulations of the medial and middle cuneiforms and navicular and medial cuneiform. Other:  There is a large skin ulceration on the plantar surface of the heel. Air is seen within the ulceration. No focal fluid collection is seen. Imaged intrinsic musculature the foot demonstrates increased T2 signal without focal fluid collection. There is subcutaneous edema about the ankle and foot. IMPRESSION: Large heel ulceration with signal change and gas in at least the posterior 4.5 cm of the calcaneus consistent with emphysematous osteomyelitis. Milder degree in the more distal calcaneus toward the calcaneocuboid joint could be due to osteomyelitis but has an appearance most suggestive of reactive change. Intense subcutaneous edema about the ankle and visualized foot consistent with dependent change and/or cellulitis. Mild intrasubstance of the distal most Achilles tendon consistent with tendinosis, possibly infectious given the patient's heel ulceration. Associated small volume of fluid in the retrocalcaneal bursa could be septic or aseptic. Edema within all imaged intrinsic musculature the foot could be due to denervation atrophy and/or inflammatory change. No intramuscular abscess is seen. Degenerative or neuropathic change about the midfoot. Electronically Signed   By: Inge Rise M.D.   On: 08/13/2016 15:09   Dg Chest Port 1 View  Result Date: 08/18/2016 CLINICAL DATA:  Fever. Hx PNA, MI, HTN, diabetes,  CHF, nonsmoker. EXAM: PORTABLE CHEST 1 VIEW COMPARISON:  08/12/2016 FINDINGS: Cardiac silhouette is normal in size. No mediastinal or hilar masses. Lung volumes are low, particularly on the right with there is elevation of right hemidiaphragm. Lungs are clear. No convincing pleural effusion. No pneumothorax. Skeletal structures are grossly intact. IMPRESSION: No active disease. Electronically Signed   By: Lajean Manes M.D.   On: 08/18/2016 14:59      ABI  BILATERAL AKA on 6/21.  Subjective:  Pain well controlled.  No chest pain or sob.   Discharge Exam: Vitals:   08/21/16 0429 08/21/16 0539  BP: (!) 170/87 (!) 170/58  Pulse: 98 75  Resp: 14   Temp: 99.2 F (37.3 C)    Vitals:   08/20/16 2126 08/20/16 2131 08/21/16 0429 08/21/16 0539  BP: (!) 191/69 (!) 175/64 (!) 170/87 (!) 170/58  Pulse: 71  98 75  Resp: 18  14   Temp: 98.5 F (36.9 C)  99.2 F (37.3 C)   TempSrc: Oral  Oral   SpO2: 98%  100%   Weight:   89.1 kg (196 lb 6.4 oz)   Height:        General: Pt is alert, awake, not in acute distress Cardiovascular: RRR, S1/S2 +, no rubs, no gallops Respiratory: CTA bilaterally, no wheezing, no rhonchi Abdominal: Soft, NT, ND, bowel sounds + Extremities: bilateral AKA, staples in place,     The results of significant diagnostics from this hospitalization (including imaging, microbiology, ancillary and laboratory) are listed below for reference.     Microbiology: Recent Results (from the past 240 hour(s))  Blood culture (routine x 2)     Status: Abnormal   Collection Time: 08/12/16  5:29 PM  Result Value Ref Range Status   Specimen Description BLOOD LEFT ARM  Final   Special Requests BOTTLES DRAWN AEROBIC AND ANAEROBIC BCAV  Final   Culture  Setup Time   Final    GRAM NEGATIVE RODS GRAM POSITIVE COCCI IN PAIRS IN BOTH AEROBIC AND ANAEROBIC BOTTLES CRITICAL RESULT CALLED TO, READ BACK BY AND VERIFIED WITH: T GREEN,PHARMD AT 1151 08/13/16 BY L BENFIELD    Culture  (A)  Final    ESCHERICHIA COLI ENTEROCOCCUS FAECALIS SUSCEPTIBILITIES PERFORMED ON PREVIOUS CULTURE WITHIN THE LAST 5 DAYS. Performed at [facility]  [facility], [place] [address] [place], [place] [address]    Report Status 08/16/2016 FINAL  Final   Organism ID, Bacteria ESCHERICHIA COLI  Final      Susceptibility   Escherichia coli - MIC*    AMPICILLIN >=32 RESISTANT Resistant     CEFAZOLIN <=4 SENSITIVE Sensitive     CEFEPIME <=1 SENSITIVE Sensitive     CEFTAZIDIME <=1 SENSITIVE Sensitive     CEFTRIAXONE <=1 SENSITIVE Sensitive     CIPROFLOXACIN <=0.25 SENSITIVE Sensitive     GENTAMICIN <=1 SENSITIVE Sensitive     IMIPENEM <=0.25 SENSITIVE Sensitive     TRIMETH/SULFA >=320 RESISTANT Resistant     AMPICILLIN/SULBACTAM 8 SENSITIVE Sensitive     PIP/TAZO <=4 SENSITIVE Sensitive     Extended ESBL NEGATIVE Sensitive     * ESCHERICHIA COLI  Blood Culture ID Panel (Reflexed)     Status: Abnormal   Collection Time: 08/12/16  5:29 PM  Result Value Ref Range Status   Enterococcus species DETECTED (A) NOT DETECTED Final    Comment: CRITICAL RESULT CALLED TO, READ BACK BY AND VERIFIED WITH: T GREEN,PHARMD AT 1151 08/13/16 BY L BENFIELD    Vancomycin resistance NOT DETECTED NOT DETECTED Final   Listeria monocytogenes NOT DETECTED NOT DETECTED Final   Staphylococcus species NOT DETECTED NOT DETECTED Final   Staphylococcus aureus NOT DETECTED NOT DETECTED Final   Streptococcus species NOT DETECTED NOT DETECTED Final   Streptococcus agalactiae NOT DETECTED NOT DETECTED Final   Streptococcus pneumoniae NOT DETECTED NOT DETECTED Final   Streptococcus pyogenes NOT DETECTED NOT DETECTED Final   Acinetobacter baumannii NOT DETECTED NOT DETECTED Final   Enterobacteriaceae species DETECTED (A) NOT DETECTED Final    Comment: CRITICAL RESULT CALLED TO, READ BACK BY AND VERIFIED WITH: T GREEN,PHARMD AT 1151 08/13/16 BY L BENFIELD    Enterobacter cloacae complex NOT DETECTED NOT DETECTED Final    Escherichia coli DETECTED (A) NOT DETECTED Final    Comment: CRITICAL RESULT CALLED TO, READ BACK BY AND VERIFIED WITH: T GREEN,PHARMD AT 1151 08/13/16 BY L BENFIELD    Klebsiella oxytoca NOT DETECTED NOT DETECTED Final   Klebsiella pneumoniae NOT DETECTED NOT DETECTED Final   Proteus species DETECTED (A) NOT DETECTED Final    Comment: CRITICAL RESULT CALLED TO, READ BACK BY AND VERIFIED WITH: T GREEN,PHARMD AT 1151 08/13/16 BY L BENFIELD    Serratia marcescens NOT DETECTED NOT DETECTED Final   Carbapenem resistance NOT DETECTED NOT DETECTED Final   Haemophilus influenzae NOT DETECTED NOT DETECTED Final   Neisseria meningitidis NOT DETECTED NOT DETECTED Final   Pseudomonas aeruginosa NOT DETECTED NOT DETECTED Final   Candida albicans NOT DETECTED NOT DETECTED Final   Candida glabrata NOT DETECTED NOT DETECTED Final   Candida krusei NOT DETECTED NOT DETECTED Final   Candida parapsilosis NOT DETECTED NOT DETECTED Final   Candida tropicalis NOT DETECTED NOT DETECTED Final    Comment: Performed at [facility], [address], [place], [place] [address]  Blood culture (routine x 2)     Status: Abnormal   Collection Time: 08/12/16  5:53 PM  Result Value Ref Range Status   Specimen Description BLOOD RIGHT HAND  Final   Special Requests   Final    BOTTLES DRAWN AEROBIC AND ANAEROBIC Blood Culture adequate volume   Culture  Setup Time   Final    GRAM NEGATIVE RODS IN BOTH AEROBIC AND ANAEROBIC BOTTLES CRITICAL RESULT CALLED TO, READ BACK BY AND VERIFIED WITH: E WILLIAMSON,PHARMD  1327 08/13/16 BY L BENFIELD GRAM POSITIVE COCCI IN PAIRS AEROBIC BOTTLE ONLY Performed at [facility], [place] [address] [place], [place] [address]    Culture PROTEUS MIRABILIS ENTEROCOCCUS FAECALIS  (A)  Final   Report Status 08/16/2016 FINAL  Final   Organism ID, Bacteria PROTEUS MIRABILIS  Final   Organism ID, Bacteria ENTEROCOCCUS FAECALIS  Final      Susceptibility   Enterococcus faecalis - MIC*     AMPICILLIN <=2 SENSITIVE Sensitive     VANCOMYCIN 1 SENSITIVE Sensitive     GENTAMICIN SYNERGY RESISTANT Resistant     * ENTEROCOCCUS FAECALIS   Proteus mirabilis - MIC*    AMPICILLIN <=2 SENSITIVE Sensitive     CEFAZOLIN <=4 SENSITIVE Sensitive     CEFEPIME <=1 SENSITIVE Sensitive     CEFTAZIDIME <=1 SENSITIVE Sensitive     CEFTRIAXONE <=1 SENSITIVE Sensitive     CIPROFLOXACIN <=0.25 SENSITIVE Sensitive     GENTAMICIN <=1 SENSITIVE Sensitive     IMIPENEM 2 SENSITIVE Sensitive     TRIMETH/SULFA <=20 SENSITIVE Sensitive     AMPICILLIN/SULBACTAM <=2 SENSITIVE Sensitive     PIP/TAZO <=4 SENSITIVE Sensitive     * PROTEUS MIRABILIS  MRSA PCR Screening     Status: None   Collection Time: 08/13/16  5:50 AM  Result Value Ref Range Status   MRSA by PCR NEGATIVE NEGATIVE Final    Comment:        The GeneXpert MRSA Assay (FDA approved for NASAL specimens only), is one component of a comprehensive MRSA colonization surveillance program. It is not intended to diagnose MRSA infection nor to guide or monitor treatment for MRSA infections.   Culture, blood (routine x 2)     Status: None   Collection Time: 08/14/16  5:14 AM  Result Value Ref Range Status   Specimen Description BLOOD RIGHT HAND  Final   Special Requests IN PEDIATRIC BOTTLE Blood Culture adequate volume  Final   Culture   Final    NO GROWTH 5 DAYS Performed at [facility], [place] [address], [place], [place] [address]    Report Status 08/19/2016 FINAL  Final  Culture, blood (routine x 2)     Status: None   Collection Time: 08/14/16  5:44 AM  Result Value Ref Range Status   Specimen Description BLOOD RIGHT HAND  Final   Special Requests   Final    BOTTLES DRAWN AEROBIC ONLY Blood Culture adequate volume   Culture   Final    NO GROWTH 5 DAYS Performed at [facility], [place] [address] [place],  [address]    Report Status 08/19/2016 FINAL  Final  Culture, blood (Routine X 2) w Reflex to ID Panel      Status: None (Preliminary result)   Collection Time: 08/18/16 12:49 PM  Result Value Ref Range Status   Specimen Description BLOOD LEFT HAND  Final   Special Requests IN PEDIATRIC BOTTLE Blood Culture adequate volume  Final   Culture NO GROWTH 2 DAYS  Final   Report Status PENDING  Incomplete  Culture, blood (Routine X 2) w Reflex to ID Panel     Status: Abnormal   Collection Time: 08/18/16  1:01 PM  Result Value Ref Range Status   Specimen Description BLOOD RIGHT HAND  Final   Special Requests IN PEDIATRIC BOTTLE Blood Culture adequate volume  Final   Culture  Setup Time   Final    GRAM POSITIVE COCCI IN CLUSTERS  AEROBIC BOTTLE ONLY CRITICAL RESULT CALLED TO, READ BACK BY AND VERIFIED WITH: A JONES,PHARMD AT 1229 08/19/16 BY L BENFIELD    Culture (A)  Final    STAPHYLOCOCCUS SPECIES (COAGULASE NEGATIVE) THE SIGNIFICANCE OF ISOLATING THIS ORGANISM FROM A SINGLE SET OF BLOOD CULTURES WHEN MULTIPLE SETS ARE DRAWN IS UNCERTAIN. PLEASE NOTIFY THE MICROBIOLOGY DEPARTMENT WITHIN ONE WEEK IF SPECIATION AND SENSITIVITIES ARE REQUIRED.    Report Status 08/21/2016 FINAL  Final  Blood Culture ID Panel (Reflexed)     Status: Abnormal   Collection Time: 08/18/16  1:01 PM  Result Value Ref Range Status   Enterococcus species NOT DETECTED NOT DETECTED Final   Listeria monocytogenes NOT DETECTED NOT DETECTED Final   Staphylococcus species DETECTED (A) NOT DETECTED Final    Comment: Methicillin (oxacillin) resistant coagulase negative staphylococcus. Possible blood culture contaminant (unless isolated from more than one blood culture draw or clinical case suggests pathogenicity). No antibiotic treatment is indicated for blood  culture contaminants. CRITICAL RESULT CALLED TO, READ BACK BY AND VERIFIED WITH: A JONES,PHARMD AT 1229 08/19/16 BY L BENFIELD    Staphylococcus aureus NOT DETECTED NOT DETECTED Final   Methicillin resistance DETECTED (A) NOT DETECTED Final    Comment: CRITICAL RESULT CALLED  TO, READ BACK BY AND VERIFIED WITH: A JONES,PHARMD AT 1229 08/19/16 BY L BENFIELD    Streptococcus species NOT DETECTED NOT DETECTED Final   Streptococcus agalactiae NOT DETECTED NOT DETECTED Final   Streptococcus pneumoniae NOT DETECTED NOT DETECTED Final   Streptococcus pyogenes NOT DETECTED NOT DETECTED Final   Acinetobacter baumannii NOT DETECTED NOT DETECTED Final   Enterobacteriaceae species NOT DETECTED NOT DETECTED Final   Enterobacter cloacae complex NOT DETECTED NOT DETECTED Final   Escherichia coli NOT DETECTED NOT DETECTED Final   Klebsiella oxytoca NOT DETECTED NOT DETECTED Final   Klebsiella pneumoniae NOT DETECTED NOT DETECTED Final   Proteus species NOT DETECTED NOT DETECTED Final   Serratia marcescens NOT DETECTED NOT DETECTED Final   Haemophilus influenzae NOT DETECTED NOT DETECTED Final   Neisseria meningitidis NOT DETECTED NOT DETECTED Final   Pseudomonas aeruginosa NOT DETECTED NOT DETECTED Final   Candida albicans NOT DETECTED NOT DETECTED Final   Candida glabrata NOT DETECTED NOT DETECTED Final   Candida krusei NOT DETECTED NOT DETECTED Final   Candida parapsilosis NOT DETECTED NOT DETECTED Final   Candida tropicalis NOT DETECTED NOT DETECTED Final     Labs: BNP (last 3 results)  Recent Labs  05/10/16 1027  BNP 75.1   Basic Metabolic Panel:  Recent Labs Lab 08/15/16 0526 08/16/16 0358 08/16/16 1406 08/17/16 0258 08/18/16 0225 08/19/16 1745  NA 135 136 141 137 140 137  K 4.1 4.4 4.2 4.5 4.0 3.7  CL 104 105  --  105 110 110  CO2 23 23  --  25 23 22   GLUCOSE 305* 185* 110* 203* 47* 224*  BUN 19 19  --  15 17 9   CREATININE 1.13 0.99  --  1.12 1.66* 1.16  CALCIUM 8.2* 8.5*  --  8.0* 7.4* 7.4*   Liver Function Tests: No results for input(s): AST, ALT, ALKPHOS, BILITOT, PROT, ALBUMIN in the last 168 hours. No results for input(s): LIPASE, AMYLASE in the last 168 hours.  Recent Labs Lab 08/18/16 1249  AMMONIA 38*   CBC:  Recent Labs Lab  08/17/16 0258 08/18/16 0225 08/19/16 0525 08/20/16 0548 08/21/16 0246  WBC 13.7* 13.0* 12.2* 10.7* 9.8  HGB 8.3* 7.2* 9.6* 8.4* 8.5*  HCT 26.9* 23.1*  30.8* 26.8* 27.7*  MCV 85.9 88.2 89.0 87.6 87.4  PLT 311 315 266 408* 423*   Cardiac Enzymes: No results for input(s): CKTOTAL, CKMB, CKMBINDEX, TROPONINI in the last 168 hours. BNP: Invalid input(s): POCBNP CBG:  Recent Labs Lab 08/20/16 0559 08/20/16 1114 08/20/16 1636 08/20/16 2123 08/21/16 0612  GLUCAP 163* 175* 149* 102* 97   D-Dimer No results for input(s): DDIMER in the last 72 hours. Hgb A1c No results for input(s): HGBA1C in the last 72 hours. Lipid Profile No results for input(s): CHOL, HDL, LDLCALC, TRIG, CHOLHDL, LDLDIRECT in the last 72 hours. Thyroid function studies No results for input(s): TSH, T4TOTAL, T3FREE, THYROIDAB in the last 72 hours.  Invalid input(s): FREET3 Anemia work up No results for input(s): VITAMINB12, FOLATE, FERRITIN, TIBC, IRON, RETICCTPCT in the last 72 hours. Urinalysis    Component Value Date/Time   COLORURINE YELLOW 08/18/2016 1305   APPEARANCEUR CLOUDY (A) 08/18/2016 1305   LABSPEC 1.013 08/18/2016 1305   PHURINE 5.0 08/18/2016 1305   GLUCOSEU NEGATIVE 08/18/2016 1305   HGBUR NEGATIVE 08/18/2016 1305   BILIRUBINUR NEGATIVE 08/18/2016 1305   KETONESUR NEGATIVE 08/18/2016 1305   PROTEINUR 30 (A) 08/18/2016 1305   UROBILINOGEN 0.2 07/12/2014 1228   NITRITE NEGATIVE 08/18/2016 1305   LEUKOCYTESUR TRACE (A) 08/18/2016 1305   Sepsis Labs Invalid input(s): PROCALCITONIN,  WBC,  LACTICIDVEN Microbiology Recent Results (from the past 240 hour(s))  Blood culture (routine x 2)     Status: Abnormal   Collection Time: 08/12/16  5:29 PM  Result Value Ref Range Status   Specimen Description BLOOD LEFT ARM  Final   Special Requests BOTTLES DRAWN AEROBIC AND ANAEROBIC BCAV  Final   Culture  Setup Time   Final    GRAM NEGATIVE RODS GRAM POSITIVE COCCI IN PAIRS IN BOTH AEROBIC AND  ANAEROBIC BOTTLES CRITICAL RESULT CALLED TO, READ BACK BY AND VERIFIED WITH: T GREEN,PHARMD AT 1151 08/13/16 BY L BENFIELD    Culture (A)  Final    ESCHERICHIA COLI ENTEROCOCCUS FAECALIS SUSCEPTIBILITIES PERFORMED ON PREVIOUS CULTURE WITHIN THE LAST 5 DAYS. Performed at [facility], [place] [address] [place], [place] [address]    Report Status 08/16/2016 FINAL  Final   Organism ID, Bacteria ESCHERICHIA COLI  Final      Susceptibility   Escherichia coli - MIC*    AMPICILLIN >=32 RESISTANT Resistant     CEFAZOLIN <=4 SENSITIVE Sensitive     CEFEPIME <=1 SENSITIVE Sensitive     CEFTAZIDIME <=1 SENSITIVE Sensitive     CEFTRIAXONE <=1 SENSITIVE Sensitive     CIPROFLOXACIN <=0.25 SENSITIVE Sensitive     GENTAMICIN <=1 SENSITIVE Sensitive     IMIPENEM <=0.25 SENSITIVE Sensitive     TRIMETH/SULFA >=320 RESISTANT Resistant     AMPICILLIN/SULBACTAM 8 SENSITIVE Sensitive     PIP/TAZO <=4 SENSITIVE Sensitive     Extended ESBL NEGATIVE Sensitive     * ESCHERICHIA COLI  Blood Culture ID Panel (Reflexed)     Status: Abnormal   Collection Time: 08/12/16  5:29 PM  Result Value Ref Range Status   Enterococcus species DETECTED (A) NOT DETECTED Final    Comment: CRITICAL RESULT CALLED TO, READ BACK BY AND VERIFIED WITH: T GREEN,PHARMD AT 1151 08/13/16 BY L BENFIELD    Vancomycin resistance NOT DETECTED NOT DETECTED Final   Listeria monocytogenes NOT DETECTED NOT DETECTED Final   Staphylococcus species NOT DETECTED NOT DETECTED Final   Staphylococcus aureus NOT DETECTED NOT DETECTED Final   Streptococcus species  NOT DETECTED NOT DETECTED Final   Streptococcus agalactiae NOT DETECTED NOT DETECTED Final   Streptococcus pneumoniae NOT DETECTED NOT DETECTED Final   Streptococcus pyogenes NOT DETECTED NOT DETECTED Final   Acinetobacter baumannii NOT DETECTED NOT DETECTED Final   Enterobacteriaceae species DETECTED (A) NOT DETECTED Final    Comment: CRITICAL RESULT CALLED TO, READ BACK BY AND  VERIFIED WITH: T GREEN,PHARMD AT 1151 08/13/16 BY L BENFIELD    Enterobacter cloacae complex NOT DETECTED NOT DETECTED Final   Escherichia coli DETECTED (A) NOT DETECTED Final    Comment: CRITICAL RESULT CALLED TO, READ BACK BY AND VERIFIED WITH: T GREEN,PHARMD AT 1151 08/13/16 BY L BENFIELD    Klebsiella oxytoca NOT DETECTED NOT DETECTED Final   Klebsiella pneumoniae NOT DETECTED NOT DETECTED Final   Proteus species DETECTED (A) NOT DETECTED Final    Comment: CRITICAL RESULT CALLED TO, READ BACK BY AND VERIFIED WITH: T GREEN,PHARMD AT 1151 08/13/16 BY L BENFIELD    Serratia marcescens NOT DETECTED NOT DETECTED Final   Carbapenem resistance NOT DETECTED NOT DETECTED Final   Haemophilus influenzae NOT DETECTED NOT DETECTED Final   Neisseria meningitidis NOT DETECTED NOT DETECTED Final   Pseudomonas aeruginosa NOT DETECTED NOT DETECTED Final   Candida albicans NOT DETECTED NOT DETECTED Final   Candida glabrata NOT DETECTED NOT DETECTED Final   Candida krusei NOT DETECTED NOT DETECTED Final   Candida parapsilosis NOT DETECTED NOT DETECTED Final   Candida tropicalis NOT DETECTED NOT DETECTED Final    Comment: Performed at [facility], [place] [address], [place], [place] [address]  Blood culture (routine x 2)     Status: Abnormal   Collection Time: 08/12/16  5:53 PM  Result Value Ref Range Status   Specimen Description BLOOD RIGHT HAND  Final   Special Requests   Final    BOTTLES DRAWN AEROBIC AND ANAEROBIC Blood Culture adequate volume   Culture  Setup Time   Final    GRAM NEGATIVE RODS IN BOTH AEROBIC AND ANAEROBIC BOTTLES CRITICAL RESULT CALLED TO, READ BACK BY AND VERIFIED WITH: E WILLIAMSON,PHARMD 1327 08/13/16 BY L BENFIELD GRAM POSITIVE COCCI IN PAIRS AEROBIC BOTTLE ONLY Performed at [facility], [place] [address], [place], [place] [address]    Culture PROTEUS MIRABILIS ENTEROCOCCUS FAECALIS  (A)  Final   Report Status 08/16/2016 FINAL  Final   Organism ID, Bacteria  PROTEUS MIRABILIS  Final   Organism ID, Bacteria ENTEROCOCCUS FAECALIS  Final      Susceptibility   Enterococcus faecalis - MIC*    AMPICILLIN <=2 SENSITIVE Sensitive     VANCOMYCIN 1 SENSITIVE Sensitive     GENTAMICIN SYNERGY RESISTANT Resistant     * ENTEROCOCCUS FAECALIS   Proteus mirabilis - MIC*    AMPICILLIN <=2 SENSITIVE Sensitive     CEFAZOLIN <=4 SENSITIVE Sensitive     CEFEPIME <=1 SENSITIVE Sensitive     CEFTAZIDIME <=1 SENSITIVE Sensitive     CEFTRIAXONE <=1 SENSITIVE Sensitive     CIPROFLOXACIN <=0.25 SENSITIVE Sensitive     GENTAMICIN <=1 SENSITIVE Sensitive     IMIPENEM 2 SENSITIVE Sensitive     TRIMETH/SULFA <=20 SENSITIVE Sensitive     AMPICILLIN/SULBACTAM <=2 SENSITIVE Sensitive     PIP/TAZO <=4 SENSITIVE Sensitive     * PROTEUS MIRABILIS  MRSA PCR Screening     Status: None   Collection Time: 08/13/16  5:50 AM  Result Value Ref Range Status   MRSA by PCR NEGATIVE NEGATIVE Final  Comment:        The GeneXpert MRSA Assay (FDA approved for NASAL specimens only), is one component of a comprehensive MRSA colonization surveillance program. It is not intended to diagnose MRSA infection nor to guide or monitor treatment for MRSA infections.   Culture, blood (routine x 2)     Status: None   Collection Time: 08/14/16  5:14 AM  Result Value Ref Range Status   Specimen Description BLOOD RIGHT HAND  Final   Special Requests IN PEDIATRIC BOTTLE Blood Culture adequate volume  Final   Culture   Final    NO GROWTH 5 DAYS Performed at [facility], [place] [address], [place], [place] [address]    Report Status 08/19/2016 FINAL  Final  Culture, blood (routine x 2)     Status: None   Collection Time: 08/14/16  5:44 AM  Result Value Ref Range Status   Specimen Description BLOOD RIGHT HAND  Final   Special Requests   Final    BOTTLES DRAWN AEROBIC ONLY Blood Culture adequate volume   Culture   Final    NO GROWTH 5 DAYS Performed at [facility], [place] [address] [place], [place] [address]    Report Status 08/19/2016 FINAL  Final  Culture, blood (Routine X 2) w Reflex to ID Panel     Status: None (Preliminary result)   Collection Time: 08/18/16 12:49 PM  Result Value Ref Range Status   Specimen Description BLOOD LEFT HAND  Final   Special Requests IN PEDIATRIC BOTTLE Blood Culture adequate volume  Final   Culture NO GROWTH 2 DAYS  Final   Report Status PENDING  Incomplete  Culture, blood (Routine X 2) w Reflex to ID Panel     Status: Abnormal   Collection Time: 08/18/16  1:01 PM  Result Value Ref Range Status   Specimen Description BLOOD RIGHT HAND  Final   Special Requests IN PEDIATRIC BOTTLE Blood Culture adequate volume  Final   Culture  Setup Time   Final    GRAM POSITIVE COCCI IN CLUSTERS AEROBIC BOTTLE ONLY CRITICAL RESULT CALLED TO, READ BACK BY AND VERIFIED WITH: A JONES,PHARMD AT 1229 08/19/16 BY L BENFIELD    Culture (A)  Final    STAPHYLOCOCCUS SPECIES (COAGULASE NEGATIVE) THE SIGNIFICANCE OF ISOLATING THIS ORGANISM FROM A SINGLE SET OF BLOOD CULTURES WHEN MULTIPLE SETS ARE DRAWN IS UNCERTAIN. PLEASE NOTIFY THE MICROBIOLOGY DEPARTMENT WITHIN ONE WEEK IF SPECIATION AND SENSITIVITIES ARE REQUIRED.    Report Status 08/21/2016 FINAL  Final  Blood Culture ID Panel (Reflexed)     Status: Abnormal   Collection Time: 08/18/16  1:01 PM  Result Value Ref Range Status   Enterococcus species NOT DETECTED NOT DETECTED Final   Listeria monocytogenes NOT DETECTED NOT DETECTED Final   Staphylococcus species DETECTED (A) NOT DETECTED Final    Comment: Methicillin (oxacillin) resistant coagulase negative staphylococcus. Possible blood culture contaminant (unless isolated from more than one blood culture draw or clinical case suggests pathogenicity). No antibiotic treatment is indicated for blood  culture contaminants. CRITICAL RESULT CALLED TO, READ BACK BY AND VERIFIED WITH: A JONES,PHARMD AT 1229 08/19/16 BY L BENFIELD    Staphylococcus  aureus NOT DETECTED NOT DETECTED Final   Methicillin resistance DETECTED (A) NOT DETECTED Final    Comment: CRITICAL RESULT CALLED TO, READ BACK BY AND VERIFIED WITH: A JONES,PHARMD AT 1229 08/19/16 BY L BENFIELD    Streptococcus species NOT DETECTED NOT DETECTED Final   Streptococcus  agalactiae NOT DETECTED NOT DETECTED Final   Streptococcus pneumoniae NOT DETECTED NOT DETECTED Final   Streptococcus pyogenes NOT DETECTED NOT DETECTED Final   Acinetobacter baumannii NOT DETECTED NOT DETECTED Final   Enterobacteriaceae species NOT DETECTED NOT DETECTED Final   Enterobacter cloacae complex NOT DETECTED NOT DETECTED Final   Escherichia coli NOT DETECTED NOT DETECTED Final   Klebsiella oxytoca NOT DETECTED NOT DETECTED Final   Klebsiella pneumoniae NOT DETECTED NOT DETECTED Final   Proteus species NOT DETECTED NOT DETECTED Final   Serratia marcescens NOT DETECTED NOT DETECTED Final   Haemophilus influenzae NOT DETECTED NOT DETECTED Final   Neisseria meningitidis NOT DETECTED NOT DETECTED Final   Pseudomonas aeruginosa NOT DETECTED NOT DETECTED Final   Candida albicans NOT DETECTED NOT DETECTED Final   Candida glabrata NOT DETECTED NOT DETECTED Final   Candida krusei NOT DETECTED NOT DETECTED Final   Candida parapsilosis NOT DETECTED NOT DETECTED Final   Candida tropicalis NOT DETECTED NOT DETECTED Final     Time coordinating discharge: Over 30 minutes  SIGNED:   Hosie Poisson, MD  [facility] 08/21/2016, 10:37 AM Pager   If 7PM-7AM, please contact night-coverage [email or web address] Password [email or web address]

## 2016-08-21 NOTE — Care Management Note (Signed)
Case Management Note Marvetta Gibbons RN, BSN Unit 2W-Case Manager [phone]  Patient Details  Name: [name] MRN: [id number] Date of Birth: 07/18/1955  Subjective/Objective:  Pt admitted fever,  Osteomyelitis of foot, s/p bil AKA on 6/21                 Action/Plan: PTA pt was at [facility]- per CSW pt/family have out standing debt to [facility] and SNF will not take pt back until they make a payment- CSW working with family on plan- however- family may have to take pt home with [facility] services- they have been provided a private duty list per [place]- awaiting to hear from family   Expected Discharge Date:                  Expected Discharge Plan:  [place] Referral:  Clinical Social Work  Discharge planning Services  CM Consult, Medication Assistance  Post Acute Care Choice:  NA Choice offered to:  Patient  DME Arranged:  N/A DME Agency:  NA  HH Arranged:  NA HH Agency:  NA  Status of Service:  Completed, signed off  If discussed at [place] of Stay Meetings, dates discussed:    Discharge Disposition:   Additional Comments:  Dawayne Patricia, RN 08/21/2016, 3:10 PM

## 2016-08-21 NOTE — Consult Note (Signed)
   [facility] Inpatient Consult   08/21/2016  [name] 1955/06/30 [id number]  Patient screened for potential [facility] services for  Multiple admissions in the past 6 months. Patient is in network for [facility] services under patient's Medicare plan. Patient is currently being seen by Palliative Care for consult, also.  The chart review reveals patient may be discharged for skilled nursing facility.  Please place a [facility] Care Management consult or for questions contact:   Natividad Brood, RN BSN [facility] Liaison  [phone] business mobile phone Toll free office [phone]

## 2016-08-21 NOTE — Progress Notes (Signed)
Per insurance check for Xarelto Xarelto 20mg  Daily does not need prior authorization. Drug is a Tier 3 and copay is $47 for one month supply. Patient has [facility] listed as pharmacies that he uses and may purchase medication at either of these locations.

## 2016-08-21 NOTE — Progress Notes (Signed)
[facility] for Infectious Disease    Date of Admission:  08/12/2016   Total days of antibiotics 10        Day 5 amp/sub             ID: [name] is a 61 y.o. male with polymicrobial bacteremia 2/2 bilateral LE /DF osteo s/p bilateral AKA Active Problems:   Diabetes type 2, uncontrolled (HCC)   Hypertension   Chronic diastolic CHF (congestive heart failure) (HCC)   Peripheral neuropathy   Personal history of DVT (deep vein thrombosis)   History of DVT (deep vein thrombosis)   Coronary artery disease involving native coronary artery of native heart without angina pectoris   Cognitive and behavioral changes   Dysphagia due to recent stroke   AKI (acute kidney injury) ([place])   Pressure injury of skin   History of CVA (cerebrovascular accident)   Osteomyelitis of right foot ([place])   Enterococcal bacteremia   Bacteremia due to Escherichia coli   Proteus infection   Osteomyelitis of left foot (HCC)   Goals of care, counseling/discussion   Palliative care by specialist   Status post bilateral above knee amputation ([place])    Subjective: Afebrile, denies leg pain  Medications:  . amLODipine  10 mg Oral Daily  . atorvastatin  40 mg Oral q1800  . bethanechol  10 mg Oral TID  . carvedilol  25 mg Oral BID WC  . cloNIDine  0.3 mg Oral TID  . docusate sodium  100 mg Oral Daily  . feeding supplement (GLUCERNA SHAKE)  237 mL Oral QID  . feeding supplement (PRO-STAT SUGAR FREE 64)  30 mL Oral BID  . FLUoxetine  10 mg Oral Daily  . insulin aspart  0-15 Units Subcutaneous TID WC  . insulin aspart  0-5 Units Subcutaneous QHS  . insulin glargine  60 Units Subcutaneous QHS  . isosorbide-hydrALAZINE  2 tablet Oral TID  . mirtazapine  7.5 mg Oral QHS  . pantoprazole  40 mg Oral Daily  . polyethylene glycol  17 g Oral Daily  . QUEtiapine  50 mg Oral Daily  . rivaroxaban  20 mg Oral Daily  . senna-docusate  1 tablet Oral Daily  . tamsulosin  0.4 mg Oral QPC supper  . zinc  sulfate  220 mg Oral Daily    Objective: Vital signs in last 24 hours: Temp:  [98.5 F (36.9 C)-99.2 F (37.3 C)] 98.8 F (37.1 C) (06/26 1335) Pulse Rate:  [70-98] 70 (06/26 1335) Resp:  [14-18] 17 (06/26 1335) BP: (148-191)/(58-87) 148/59 (06/26 1335) SpO2:  [98 %-100 %] 99 % (06/26 1335) Weight:  [196 lb 6.4 oz (89.1 kg)] 196 lb 6.4 oz (89.1 kg) (06/26 0429)  gen = easily aroused, a xo by 1 in NAD HEENT = moist mucous membranes Pulm= CTAB no w/c/r Cors = nl s1,s2 distant heart sounds Ext = both LE have staples in place c/d/i. No oozing or erythema Lab Results  Recent Labs  08/19/16 1745 08/20/16 0548 08/21/16 0246  WBC  --  10.7* 9.8  HGB  --  8.4* 8.5*  HCT  --  26.8* 27.7*  NA 137  --   --   K 3.7  --   --   CL 110  --   --   CO2 22  --   --   BUN 9  --   --   CREATININE 1.16  --   --    Liver Panel No  results for input(s): PROT, ALBUMIN, AST, ALT, ALKPHOS, BILITOT, BILIDIR, IBILI in the last 72 hours. Sedimentation Rate  Recent Labs  08/21/16 0246  ESRSEDRATE 134*   C-Reactive Protein  Recent Labs  08/20/16 0548 08/21/16 0246  CRP 6.9* 4.8*    Microbiology:  reviewed  Assessment/Plan: Polymicrobial bacteremia from DfU/osteo = plan to treat for 14 days using amp/sub at current dosing. Use 6/19 as day 1. Thus end on 7/2.  Will sign off. Call if questions  [name] [facility] for Infectious Diseases Cell: [phone] Pager: [phone]  08/21/2016, 3:29 PM

## 2016-08-21 NOTE — Anesthesia Preprocedure Evaluation (Signed)
Anesthesia Evaluation  Patient identified by MRN, date of birth, ID band Patient awake    Reviewed: Allergy & Precautions, NPO status , Patient's Chart, lab work & pertinent test results  History of Anesthesia Complications Negative for: history of anesthetic complications  Airway Mallampati: II  TM Distance: >3 FB Neck ROM: Full    Dental  (+) Dental Advisory Given, Poor Dentition   Pulmonary shortness of breath, pneumonia,    breath sounds clear to auscultation       Cardiovascular hypertension, Pt. on medications + CAD, + Past MI, + Peripheral Vascular Disease and +CHF   Rhythm:Regular     Neuro/Psych PSYCHIATRIC DISORDERS Depression TIA Neuromuscular disease CVA, Residual Symptoms    GI/Hepatic negative GI ROS, Neg liver ROS,   Endo/Other  diabetes, Type 2Morbid obesity  Renal/GU Renal disease     Musculoskeletal  (+) Arthritis ,   Abdominal   Peds  Hematology  (+) anemia ,   Anesthesia Other Findings   Reproductive/Obstetrics                             Anesthesia Physical Anesthesia Plan  ASA: III  Anesthesia Plan: General   Post-op Pain Management:    Induction: Intravenous  PONV Risk Score and Plan: 2 and Ondansetron and Dexamethasone  Airway Management Planned: Oral ETT  Additional Equipment: None  Intra-op Plan:   Post-operative Plan: Extubation in OR  Informed Consent: I have reviewed the patients History and Physical, chart, labs and discussed the procedure including the risks, benefits and alternatives for the proposed anesthesia with the patient or authorized representative who has indicated his/her understanding and acceptance.   Dental advisory given  Plan Discussed with: CRNA and Surgeon  Anesthesia Plan Comments:         Anesthesia Quick Evaluation

## 2016-08-21 NOTE — Progress Notes (Signed)
Patient B.P. 170/87 Labetalol 10 mg I.V. Given. Recheck B.P. 170/58 Labetalol 10 mg I.V. given

## 2016-08-22 DIAGNOSIS — R4182 Altered mental status, unspecified: Secondary | ICD-10-CM

## 2016-08-22 DIAGNOSIS — M86272 Subacute osteomyelitis, left ankle and foot: Secondary | ICD-10-CM

## 2016-08-22 LAB — CBC
HCT: 28.1 % — ABNORMAL LOW (ref 39.0–52.0)
HEMOGLOBIN: 8.6 g/dL — AB (ref 13.0–17.0)
MCH: 27.4 pg (ref 26.0–34.0)
MCHC: 30.6 g/dL (ref 30.0–36.0)
MCV: 89.5 fL (ref 78.0–100.0)
Platelets: 436 10*3/uL — ABNORMAL HIGH (ref 150–400)
RBC: 3.14 MIL/uL — AB (ref 4.22–5.81)
RDW: 14.9 % (ref 11.5–15.5)
WBC: 10.2 10*3/uL (ref 4.0–10.5)

## 2016-08-22 LAB — GLUCOSE, CAPILLARY
GLUCOSE-CAPILLARY: 51 mg/dL — AB (ref 65–99)
GLUCOSE-CAPILLARY: 73 mg/dL (ref 65–99)
Glucose-Capillary: 74 mg/dL (ref 65–99)
Glucose-Capillary: 90 mg/dL (ref 65–99)

## 2016-08-22 LAB — SEDIMENTATION RATE: Sed Rate: 117 mm/hr — ABNORMAL HIGH (ref 0–16)

## 2016-08-22 LAB — PROCALCITONIN: PROCALCITONIN: 0.83 ng/mL

## 2016-08-22 LAB — C-REACTIVE PROTEIN: CRP: 2.2 mg/dL — ABNORMAL HIGH (ref <1.0)

## 2016-08-22 MED ORDER — INSULIN GLARGINE 100 UNIT/ML ~~LOC~~ SOLN
30.0000 [IU] | Freq: Every day | SUBCUTANEOUS | Status: DC
  Administered 2016-08-24: 30 [IU] via SUBCUTANEOUS
  Filled 2016-08-22 (×2): qty 0.3

## 2016-08-22 NOTE — Progress Notes (Signed)
Received return call from pt's daughter Myrlene Broker- per conversation pt's other daughter [name] is POA- and Velna Hatchet states that they do not have a "good relationship"- she reports that she works 3rd shift and sometimes a day job- recently moved here about a month ago and has an upstairs apartment that she does not feel like she has the space to accommodate her dad or offer a safe discharge to her place. She is concerned about finding a safe place- not sure about her sister's place as she has never been there. She was unaware of the financial situation and debt at the SNF- and thought her dad would be returning to [facility] for further rehab. Discussed that pt was medically stable for discharge - Velna Hatchet states that there are no other family members to assist- discussed [place] options/private pay assistance/possible DME needs- she is not sure they can private pay and is not sure where dad would go- as he no longer has his own place following stroke they got rid of his place. Requested that she please let her sister know that CM and CSW have been trying to reach her and to please call us so that we can work on a safe discharge plan as we have left messages for [name] with no return phone call. Velna Hatchet states she will send sister a message. CM and CSW will continue to work with family to find a safe discharge for pt. Spoke with Pamala Hurry with [facility]- pt currently not appropriate for [facility].

## 2016-08-22 NOTE — Progress Notes (Signed)
PROGRESS NOTE    [name]  ZHG:[id number] DOB: 10-29-1955 DOA: 08/12/2016 PCP: No primary care provider on file.    Brief Narrative:  61 year old with h/o DM, hypertension, chronic diastolic CHF, DVT, CAD, PVD, CVA with residual dysarthria coming in from SNF for persistent fevers. He was found to have bilateral heel ulcers , which have worsened over the last few weeks. This admission, ABI obtained showed severe / critical disease, vascular surgery consulted and transferred to [facility] for further work up/ intervention. Patient underwent bilateral AKA on 6/21. On the early hours of 6/22, pt had an episode of altered mental status possibly form oversedation from pain meds. CT head was negative for acute stroke. As continued to be lethargic , MRI of thebrain was also done and it was negative for acute intracranial pathology, he was found to have old strokes and small vessel disease.     Assessment & Plan:   Principal Problem:   Sepsis ([place]) Active Problems:   Enterococcal bacteremia   Bacteremia due to Escherichia coli   Osteomyelitis of right foot (HCC)   Proteus infection   Osteomyelitis of left foot (HCC)   Diabetes type 2, uncontrolled (HCC)   Hypertension   Chronic diastolic CHF (congestive heart failure) (HCC)   Peripheral neuropathy   Personal history of DVT (deep vein thrombosis)   History of DVT (deep vein thrombosis)   Coronary artery disease involving native coronary artery of native heart without angina pectoris   Cognitive and behavioral changes   Dysphagia due to recent stroke   AKI (acute kidney injury) ([place])   Pressure injury of skin   History of CVA (cerebrovascular accident)   Goals of care, counseling/discussion   Palliative care by specialist   Status post bilateral above knee amputation ([place])   Acute on chronic alteration in mental status  #1 sepsis secondary to osteomyelitis from bilateral heel ulcers, polymicrobial bacteremia Patient with clinical  improvement. Patient currently afebrile. Leukocytosis improved. CRP trending down. Repeat blood cultures from 08/18/2016 with 1 out of 2 coagulase negative staph likely a contaminant. Initial blood cultures from 08/12/2016 polymicrobial with Escherichia coli, Enterococcus faecalis, Proteus species. Patient initially on IV vancomycin, IV Flagyl and IV Rocephin which was subsequently discontinued per ID recommendations. Patient currently on IV Unasyn per ID recommendations and will need IV antibiotics for total of 14 days through 08/27/2016. Follow.  #2 PAD Patient status post bilateral AKA on 08/16/2016. Statin has been resumed. On Xarelto.  #3 coronary artery disease Stable. Continue statin and Coreg, BiDil.  #4 history of DVT Stable. Continue Xarelto.  #5 acute kidney injury Improved. Repeat labs in the morning.  #6 history of chronic diastolic heart failure Currently euvolemic. Lasix on hold. Follow.  #7 history of dysphasia From prior CVA. Patient seen by speech therapy was recommended dysphagia 2 diet. Continue.  #8 anemia of chronic disease Stable. Hemoglobin dropped to 7.2 patient status post 1 unit packed red blood cells. Hemoglobin currently stable at 8.6.  #9 diabetes mellitus type 2 Hemoglobin A1c 8.3. Patient noted to be hypoglycemic with poor oral intake. CBGs in the 79s. Will decrease Lantus to 30 units daily. Sliding scale insulin.  #10 history of CVA Risk factor modification.  #11 acute encephalopathy Initially felt to be secondary to oversedation from pain meds. CT head was negative for any acute intracranial abnormality. MRI of the head was negative for any acute abnormalities. IV pain medication was discontinued. Clinical improvement.  #12 hypertension Continue Norvasc, Coreg, clonidine,  BiDil.   DVT prophylaxis: Xarelto. Code Status: Full Family Communication: Updated patient. No family at bedside. Disposition Plan: Likely home as patient unable to get into  skilled nursing facility per social work.   Consultants:   Vascular surgery Dr Donzetta Matters.   Infectious disease Dr Megan Salon  Wound care  Palliative care consult.    Procedures:   ABIs  Bilateral AKA 08/16/2016--per Dr. Trula Slade  Antimicrobials:  IV Rocephin 08/13/2016>>>> 08/17/2016  IV Flagyl 08/12/2016 >>>>>> 08/17/2016   IV vancomycin 08/12/2016>>>>> 08/17/2016  IV Unasyn 08/17/2016   Subjective: Patient denies any chest pain or shortness of breath. Patient denies any bleeding.  Objective: Vitals:   08/21/16 0539 08/21/16 1335 08/21/16 2006 08/22/16 0423  BP: (!) 170/58 (!) 148/59 (!) 143/66 (!) 160/62  Pulse: 75 70 74 67  Resp:  17 18 18   Temp:  98.8 F (37.1 C) 98.2 F (36.8 C) 98.3 F (36.8 C)  TempSrc:  Axillary Oral Oral  SpO2:  99% 98% 100%  Weight:    88.3 kg (194 lb 9.6 oz)  Height:        Intake/Output Summary (Last 24 hours) at 08/22/16 1109 Last data filed at 08/22/16 0900  Gross per 24 hour  Intake              980 ml  Output             1650 ml  Net             -670 ml   Filed Weights   08/20/16 0445 08/21/16 0429 08/22/16 0423  Weight: 91 kg (200 lb 11.2 oz) 89.1 kg (196 lb 6.4 oz) 88.3 kg (194 lb 9.6 oz)    Examination:  General exam: Appears calm and comfortable  Respiratory system: Clear to auscultation Anterior lung fields. Respiratory effort normal. Cardiovascular system: S1 & S2 heard, RRR. No JVD, murmurs, rubs, gallops or clicks. No pedal edema. Gastrointestinal system: Abdomen is nondistended, soft and nontender. No organomegaly or masses felt. Normal bowel sounds heard. Central nervous system: Alert and oriented. No focal neurological deficits. Extremities: Status post bilateral AKA. Skin: No rashes, lesions or ulcers Psychiatry: Judgement and insight appear normal. Mood & affect appropriate.     Data Reviewed: I have personally reviewed following labs and imaging studies  CBC:  Recent Labs Lab 08/18/16 0225  08/19/16 0525 08/20/16 0548 08/21/16 0246 08/22/16 0551  WBC 13.0* 12.2* 10.7* 9.8 10.2  HGB 7.2* 9.6* 8.4* 8.5* 8.6*  HCT 23.1* 30.8* 26.8* 27.7* 28.1*  MCV 88.2 89.0 87.6 87.4 89.5  PLT 315 266 408* 423* 092*   Basic Metabolic Panel:  Recent Labs Lab 08/16/16 0358 08/16/16 1406 08/17/16 0258 08/18/16 0225 08/19/16 1745  NA 136 141 137 140 137  K 4.4 4.2 4.5 4.0 3.7  CL 105  --  105 110 110  CO2 23  --  25 23 22   GLUCOSE 185* 110* 203* 47* 224*  BUN 19  --  15 17 9   CREATININE 0.99  --  1.12 1.66* 1.16  CALCIUM 8.5*  --  8.0* 7.4* 7.4*   GFR: Estimated Creatinine Clearance: 74.4 mL/min (by C-G formula based on SCr of 1.16 mg/dL). Liver Function Tests: No results for input(s): AST, ALT, ALKPHOS, BILITOT, PROT, ALBUMIN in the last 168 hours. No results for input(s): LIPASE, AMYLASE in the last 168 hours.  Recent Labs Lab 08/18/16 1249  AMMONIA 38*   Coagulation Profile:  Recent Labs Lab 08/16/16 0358  INR 1.10  Cardiac Enzymes: No results for input(s): CKTOTAL, CKMB, CKMBINDEX, TROPONINI in the last 168 hours. BNP (last 3 results) No results for input(s): PROBNP in the last 8760 hours. HbA1C: No results for input(s): HGBA1C in the last 72 hours. CBG:  Recent Labs Lab 08/20/16 2123 08/21/16 0612 08/21/16 1119 08/21/16 1638 08/21/16 2036  GLUCAP 102* 97 105* 74 166*   Lipid Profile: No results for input(s): CHOL, HDL, LDLCALC, TRIG, CHOLHDL, LDLDIRECT in the last 72 hours. Thyroid Function Tests: No results for input(s): TSH, T4TOTAL, FREET4, T3FREE, THYROIDAB in the last 72 hours. Anemia Panel: No results for input(s): VITAMINB12, FOLATE, FERRITIN, TIBC, IRON, RETICCTPCT in the last 72 hours. Sepsis Labs:  Recent Labs Lab 08/18/16 1249 08/20/16 0548 08/22/16 0551  PROCALCITON 1.35 0.52 0.83  LATICACIDVEN 0.5  --   --     Recent Results (from the past 240 hour(s))  Blood culture (routine x 2)     Status: Abnormal   Collection Time:  08/12/16  5:29 PM  Result Value Ref Range Status   Specimen Description BLOOD LEFT ARM  Final   Special Requests BOTTLES DRAWN AEROBIC AND ANAEROBIC BCAV  Final   Culture  Setup Time   Final    GRAM NEGATIVE RODS GRAM POSITIVE COCCI IN PAIRS IN BOTH AEROBIC AND ANAEROBIC BOTTLES CRITICAL RESULT CALLED TO, READ BACK BY AND VERIFIED WITH: T GREEN,PHARMD AT 1151 08/13/16 BY L BENFIELD    Culture (A)  Final    ESCHERICHIA COLI ENTEROCOCCUS FAECALIS SUSCEPTIBILITIES PERFORMED ON PREVIOUS CULTURE WITHIN THE LAST 5 DAYS. Performed at [facility], [place] [address], [place], [place] [address]    Report Status 08/16/2016 FINAL  Final   Organism ID, Bacteria ESCHERICHIA COLI  Final      Susceptibility   Escherichia coli - MIC*    AMPICILLIN >=32 RESISTANT Resistant     CEFAZOLIN <=4 SENSITIVE Sensitive     CEFEPIME <=1 SENSITIVE Sensitive     CEFTAZIDIME <=1 SENSITIVE Sensitive     CEFTRIAXONE <=1 SENSITIVE Sensitive     CIPROFLOXACIN <=0.25 SENSITIVE Sensitive     GENTAMICIN <=1 SENSITIVE Sensitive     IMIPENEM <=0.25 SENSITIVE Sensitive     TRIMETH/SULFA >=320 RESISTANT Resistant     AMPICILLIN/SULBACTAM 8 SENSITIVE Sensitive     PIP/TAZO <=4 SENSITIVE Sensitive     Extended ESBL NEGATIVE Sensitive     * ESCHERICHIA COLI  Blood Culture ID Panel (Reflexed)     Status: Abnormal   Collection Time: 08/12/16  5:29 PM  Result Value Ref Range Status   Enterococcus species DETECTED (A) NOT DETECTED Final    Comment: CRITICAL RESULT CALLED TO, READ BACK BY AND VERIFIED WITH: T GREEN,PHARMD AT 1151 08/13/16 BY L BENFIELD    Vancomycin resistance NOT DETECTED NOT DETECTED Final   Listeria monocytogenes NOT DETECTED NOT DETECTED Final   Staphylococcus species NOT DETECTED NOT DETECTED Final   Staphylococcus aureus NOT DETECTED NOT DETECTED Final   Streptococcus species NOT DETECTED NOT DETECTED Final   Streptococcus agalactiae NOT DETECTED NOT DETECTED Final   Streptococcus pneumoniae NOT  DETECTED NOT DETECTED Final   Streptococcus pyogenes NOT DETECTED NOT DETECTED Final   Acinetobacter baumannii NOT DETECTED NOT DETECTED Final   Enterobacteriaceae species DETECTED (A) NOT DETECTED Final    Comment: CRITICAL RESULT CALLED TO, READ BACK BY AND VERIFIED WITH: T GREEN,PHARMD AT 1151 08/13/16 BY L BENFIELD    Enterobacter cloacae complex NOT DETECTED NOT DETECTED Final   Escherichia coli DETECTED (A)  NOT DETECTED Final    Comment: CRITICAL RESULT CALLED TO, READ BACK BY AND VERIFIED WITH: T GREEN,PHARMD AT 1151 08/13/16 BY L BENFIELD    Klebsiella oxytoca NOT DETECTED NOT DETECTED Final   Klebsiella pneumoniae NOT DETECTED NOT DETECTED Final   Proteus species DETECTED (A) NOT DETECTED Final    Comment: CRITICAL RESULT CALLED TO, READ BACK BY AND VERIFIED WITH: T GREEN,PHARMD AT 1151 08/13/16 BY L BENFIELD    Serratia marcescens NOT DETECTED NOT DETECTED Final   Carbapenem resistance NOT DETECTED NOT DETECTED Final   Haemophilus influenzae NOT DETECTED NOT DETECTED Final   Neisseria meningitidis NOT DETECTED NOT DETECTED Final   Pseudomonas aeruginosa NOT DETECTED NOT DETECTED Final   Candida albicans NOT DETECTED NOT DETECTED Final   Candida glabrata NOT DETECTED NOT DETECTED Final   Candida krusei NOT DETECTED NOT DETECTED Final   Candida parapsilosis NOT DETECTED NOT DETECTED Final   Candida tropicalis NOT DETECTED NOT DETECTED Final    Comment: Performed at [facility], [place] [address] [place], [place] [address]  Blood culture (routine x 2)     Status: Abnormal   Collection Time: 08/12/16  5:53 PM  Result Value Ref Range Status   Specimen Description BLOOD RIGHT HAND  Final   Special Requests   Final    BOTTLES DRAWN AEROBIC AND ANAEROBIC Blood Culture adequate volume   Culture  Setup Time   Final    GRAM NEGATIVE RODS IN BOTH AEROBIC AND ANAEROBIC BOTTLES CRITICAL RESULT CALLED TO, READ BACK BY AND VERIFIED WITH: E WILLIAMSON,PHARMD 1327 08/13/16 BY L  BENFIELD GRAM POSITIVE COCCI IN PAIRS AEROBIC BOTTLE ONLY Performed at [facility], [place] [address] [place], [place] [address]    Culture PROTEUS MIRABILIS ENTEROCOCCUS FAECALIS  (A)  Final   Report Status 08/16/2016 FINAL  Final   Organism ID, Bacteria PROTEUS MIRABILIS  Final   Organism ID, Bacteria ENTEROCOCCUS FAECALIS  Final      Susceptibility   Enterococcus faecalis - MIC*    AMPICILLIN <=2 SENSITIVE Sensitive     VANCOMYCIN 1 SENSITIVE Sensitive     GENTAMICIN SYNERGY RESISTANT Resistant     * ENTEROCOCCUS FAECALIS   Proteus mirabilis - MIC*    AMPICILLIN <=2 SENSITIVE Sensitive     CEFAZOLIN <=4 SENSITIVE Sensitive     CEFEPIME <=1 SENSITIVE Sensitive     CEFTAZIDIME <=1 SENSITIVE Sensitive     CEFTRIAXONE <=1 SENSITIVE Sensitive     CIPROFLOXACIN <=0.25 SENSITIVE Sensitive     GENTAMICIN <=1 SENSITIVE Sensitive     IMIPENEM 2 SENSITIVE Sensitive     TRIMETH/SULFA <=20 SENSITIVE Sensitive     AMPICILLIN/SULBACTAM <=2 SENSITIVE Sensitive     PIP/TAZO <=4 SENSITIVE Sensitive     * PROTEUS MIRABILIS  MRSA PCR Screening     Status: None   Collection Time: 08/13/16  5:50 AM  Result Value Ref Range Status   MRSA by PCR NEGATIVE NEGATIVE Final    Comment:        The GeneXpert MRSA Assay (FDA approved for NASAL specimens only), is one component of a comprehensive MRSA colonization surveillance program. It is not intended to diagnose MRSA infection nor to guide or monitor treatment for MRSA infections.   Culture, blood (routine x 2)     Status: None   Collection Time: 08/14/16  5:14 AM  Result Value Ref Range Status   Specimen Description BLOOD RIGHT HAND  Final   Special Requests IN PEDIATRIC BOTTLE  Blood Culture adequate volume  Final   Culture   Final    NO GROWTH 5 DAYS Performed at [facility], [place] [address] [place], [place] [address]    Report Status 08/19/2016 FINAL  Final  Culture, blood (routine x 2)     Status: None   Collection Time:  08/14/16  5:44 AM  Result Value Ref Range Status   Specimen Description BLOOD RIGHT HAND  Final   Special Requests   Final    BOTTLES DRAWN AEROBIC ONLY Blood Culture adequate volume   Culture   Final    NO GROWTH 5 DAYS Performed at [facility], [place] [address], [place],  [address]    Report Status 08/19/2016 FINAL  Final  Culture, blood (Routine X 2) w Reflex to ID Panel     Status: None (Preliminary result)   Collection Time: 08/18/16 12:49 PM  Result Value Ref Range Status   Specimen Description BLOOD LEFT HAND  Final   Special Requests IN PEDIATRIC BOTTLE Blood Culture adequate volume  Final   Culture NO GROWTH 3 DAYS  Final   Report Status PENDING  Incomplete  Culture, blood (Routine X 2) w Reflex to ID Panel     Status: Abnormal   Collection Time: 08/18/16  1:01 PM  Result Value Ref Range Status   Specimen Description BLOOD RIGHT HAND  Final   Special Requests IN PEDIATRIC BOTTLE Blood Culture adequate volume  Final   Culture  Setup Time   Final    GRAM POSITIVE COCCI IN CLUSTERS AEROBIC BOTTLE ONLY CRITICAL RESULT CALLED TO, READ BACK BY AND VERIFIED WITH: A JONES,PHARMD AT 1229 08/19/16 BY L BENFIELD    Culture (A)  Final    STAPHYLOCOCCUS SPECIES (COAGULASE NEGATIVE) THE SIGNIFICANCE OF ISOLATING THIS ORGANISM FROM A SINGLE SET OF BLOOD CULTURES WHEN MULTIPLE SETS ARE DRAWN IS UNCERTAIN. PLEASE NOTIFY THE MICROBIOLOGY DEPARTMENT WITHIN ONE WEEK IF SPECIATION AND SENSITIVITIES ARE REQUIRED.    Report Status 08/21/2016 FINAL  Final  Blood Culture ID Panel (Reflexed)     Status: Abnormal   Collection Time: 08/18/16  1:01 PM  Result Value Ref Range Status   Enterococcus species NOT DETECTED NOT DETECTED Final   Listeria monocytogenes NOT DETECTED NOT DETECTED Final   Staphylococcus species DETECTED (A) NOT DETECTED Final    Comment: Methicillin (oxacillin) resistant coagulase negative staphylococcus. Possible blood culture contaminant (unless isolated from more  than one blood culture draw or clinical case suggests pathogenicity). No antibiotic treatment is indicated for blood  culture contaminants. CRITICAL RESULT CALLED TO, READ BACK BY AND VERIFIED WITH: A JONES,PHARMD AT 1229 08/19/16 BY L BENFIELD    Staphylococcus aureus NOT DETECTED NOT DETECTED Final   Methicillin resistance DETECTED (A) NOT DETECTED Final    Comment: CRITICAL RESULT CALLED TO, READ BACK BY AND VERIFIED WITH: A JONES,PHARMD AT 1229 08/19/16 BY L BENFIELD    Streptococcus species NOT DETECTED NOT DETECTED Final   Streptococcus agalactiae NOT DETECTED NOT DETECTED Final   Streptococcus pneumoniae NOT DETECTED NOT DETECTED Final   Streptococcus pyogenes NOT DETECTED NOT DETECTED Final   Acinetobacter baumannii NOT DETECTED NOT DETECTED Final   Enterobacteriaceae species NOT DETECTED NOT DETECTED Final   Enterobacter cloacae complex NOT DETECTED NOT DETECTED Final   Escherichia coli NOT DETECTED NOT DETECTED Final   Klebsiella oxytoca NOT DETECTED NOT DETECTED Final   Klebsiella pneumoniae NOT DETECTED NOT DETECTED Final   Proteus species NOT DETECTED NOT DETECTED Final  Serratia marcescens NOT DETECTED NOT DETECTED Final   Haemophilus influenzae NOT DETECTED NOT DETECTED Final   Neisseria meningitidis NOT DETECTED NOT DETECTED Final   Pseudomonas aeruginosa NOT DETECTED NOT DETECTED Final   Candida albicans NOT DETECTED NOT DETECTED Final   Candida glabrata NOT DETECTED NOT DETECTED Final   Candida krusei NOT DETECTED NOT DETECTED Final   Candida parapsilosis NOT DETECTED NOT DETECTED Final   Candida tropicalis NOT DETECTED NOT DETECTED Final         Radiology Studies: No results found.      Scheduled Meds: . amLODipine  10 mg Oral Daily  . atorvastatin  40 mg Oral q1800  . bethanechol  10 mg Oral TID  . carvedilol  25 mg Oral BID WC  . cloNIDine  0.3 mg Oral TID  . docusate sodium  100 mg Oral Daily  . feeding supplement (GLUCERNA SHAKE)  237 mL Oral  QID  . feeding supplement (PRO-STAT SUGAR FREE 64)  30 mL Oral BID  . FLUoxetine  10 mg Oral Daily  . insulin aspart  0-15 Units Subcutaneous TID WC  . insulin aspart  0-5 Units Subcutaneous QHS  . insulin glargine  60 Units Subcutaneous QHS  . isosorbide-hydrALAZINE  2 tablet Oral TID  . mirtazapine  7.5 mg Oral QHS  . pantoprazole  40 mg Oral Daily  . polyethylene glycol  17 g Oral Daily  . QUEtiapine  50 mg Oral Daily  . rivaroxaban  20 mg Oral Daily  . senna-docusate  1 tablet Oral Daily  . tamsulosin  0.4 mg Oral QPC supper  . zinc sulfate  220 mg Oral Daily   Continuous Infusions: . sodium chloride    . sodium chloride    . ampicillin-sulbactam (UNASYN) IV Stopped (08/22/16 0601)  . magnesium sulfate 1 - 4 g bolus IVPB       LOS: 10 days    Time spent: 35 minutes    Kelby Lotspeich, MD [facility] Pager [phone]  If 7PM-7AM, please contact night-coverage [email or web address] Password [facility] 08/22/2016, 11:09 AM

## 2016-08-22 NOTE — Progress Notes (Signed)
PHARMACY CONSULT NOTE FOR:  OUTPATIENT  PARENTERAL ANTIBIOTIC THERAPY (OPAT)  Indication: Bacteremia Regimen: Unasyn 3g IV every 6 hours End date: 7/2  IV antibiotic discharge orders are pended. To discharging provider:  please sign these orders via discharge navigator,  Select New Orders & click on the button choice - Manage This Unsigned Work.     Thank you for allowing pharmacy to be a part of this patient's care.  Lawson Radar 08/22/2016, 2:49 PM

## 2016-08-22 NOTE — Progress Notes (Signed)
Physical Therapy Treatment Patient Details Name: [name] MRN: [id number] DOB: 1955-09-03 Today's Date: 08/22/2016    History of Present Illness Patient is a 61 yo male admitted 08/12/16 for B AKA due to non healing B heel wounds and ischemia. PMH:  blind Lt eye, CVA with Rt hemi, CHF, diabetes, herpes, hypertension, heart attack, peripheral neuropathy     PT Comments    Pt continues to require total assist for all mobility. Continue to recommend SNF. If pt unable to go to SNF will need below equipment and maximum home health services.   Follow Up Recommendations  SNF (if unable to go to SNF will need max home health services)     Equipment Recommendations  Other (comment);Wheelchair (measurements PT);Wheelchair cushion (measurements PT);Hospital bed (hoyer lift and air mattress for hospital bed)    Recommendations for Other Services       Precautions / Restrictions Precautions Precautions: Fall    Mobility  Bed Mobility Overal bed mobility: Needs Assistance Bed Mobility: Supine to Sit;Rolling Rolling: +2 for physical assistance;Total assist   Supine to sit: +2 for physical assistance;Total assist     General bed mobility comments: Pt from supine to long sitting with pt assisting a little by pulling forward with LUE. In rolling pt able to reach with LUE and turn head to right to assist with rolling but still requiring total assist to roll to rt. To roll lt pt assisting by turning head toward lt but still requires total assist.   Transfers                 General transfer comment: Needs mechanical lift.  Ambulation/Gait                 Stairs            Wheelchair Mobility    Modified Rankin (Stroke Patients Only)       Balance Overall balance assessment: Needs assistance Sitting-balance support: Single extremity supported Sitting balance-Leahy Scale: Zero Sitting balance - Comments: Max assist to maintain long sitting Postural control:  Posterior lean                                  Cognition Arousal/Alertness: Awake/alert Behavior During Therapy: Flat affect Overall Cognitive Status: No family/caregiver present to determine baseline cognitive functioning                                 General Comments: Pt didn't verbalize during treatment.       Exercises      General Comments        Pertinent Vitals/Pain Pain Assessment: Faces Faces Pain Scale: No hurt    Home Living                      Prior Function            PT Goals (current goals can now be found in the care plan section) Progress towards PT goals: Not progressing toward goals - comment    Frequency    Min 1X/week      PT Plan Current plan remains appropriate;Equipment recommendations need to be updated;Frequency needs to be updated    Co-evaluation              AM-PAC PT "6 Clicks" Daily Activity  Outcome Measure  Difficulty turning  over in bed (including adjusting bedclothes, sheets and blankets)?: Total Difficulty moving from lying on back to sitting on the side of the bed? : Total Difficulty sitting down on and standing up from a chair with arms (e.g., wheelchair, bedside commode, etc,.)?: Total Help needed moving to and from a bed to chair (including a wheelchair)?: Total Help needed walking in hospital room?: Total Help needed climbing 3-5 steps with a railing? : Total 6 Click Score: 6    End of Session Equipment Utilized During Treatment: Oxygen Activity Tolerance: Patient tolerated treatment well Patient left: in bed;with call bell/phone within reach;with bed alarm set Nurse Communication: Need for lift equipment PT Visit Diagnosis: Muscle weakness (generalized) (M62.81);Other abnormalities of gait and mobility (R26.89)     Time: 1149-1200 PT Time Calculation (min) (ACUTE ONLY): 11 min  Charges:  $Therapeutic Activity: 8-22 mins                    G Codes:         [facility] PT [place] 08/22/2016, 12:54 PM

## 2016-08-23 LAB — BASIC METABOLIC PANEL
Anion gap: 8 (ref 5–15)
BUN: 7 mg/dL (ref 6–20)
CHLORIDE: 108 mmol/L (ref 101–111)
CO2: 26 mmol/L (ref 22–32)
Calcium: 7.9 mg/dL — ABNORMAL LOW (ref 8.9–10.3)
Creatinine, Ser: 1.1 mg/dL (ref 0.61–1.24)
GFR calc Af Amer: >60 mL/min (ref >60)
GFR calc non Af Amer: >60 mL/min (ref >60)
Glucose, Bld: 114 mg/dL — ABNORMAL HIGH (ref 65–99)
POTASSIUM: 3.5 mmol/L (ref 3.5–5.1)
SODIUM: 142 mmol/L (ref 135–145)

## 2016-08-23 LAB — CBC
HCT: 26.9 % — ABNORMAL LOW (ref 39.0–52.0)
HEMOGLOBIN: 8.3 g/dL — AB (ref 13.0–17.0)
MCH: 27.1 pg (ref 26.0–34.0)
MCHC: 30.9 g/dL (ref 30.0–36.0)
MCV: 87.9 fL (ref 78.0–100.0)
Platelets: 451 10*3/uL — ABNORMAL HIGH (ref 150–400)
RBC: 3.06 MIL/uL — AB (ref 4.22–5.81)
RDW: 14.8 % (ref 11.5–15.5)
WBC: 9 10*3/uL (ref 4.0–10.5)

## 2016-08-23 LAB — GLUCOSE, CAPILLARY
GLUCOSE-CAPILLARY: 81 mg/dL (ref 65–99)
GLUCOSE-CAPILLARY: 187 mg/dL — AB (ref 65–99)
Glucose-Capillary: 89 mg/dL (ref 65–99)
Glucose-Capillary: 244 mg/dL — ABNORMAL HIGH (ref 65–99)

## 2016-08-23 LAB — CULTURE, BLOOD (ROUTINE X 2)
CULTURE: NO GROWTH
Special Requests: ADEQUATE

## 2016-08-23 LAB — C-REACTIVE PROTEIN: CRP: 1.3 mg/dL — AB (ref <1.0)

## 2016-08-23 LAB — SEDIMENTATION RATE: Sed Rate: 110 mm/hr — ABNORMAL HIGH (ref 0–16)

## 2016-08-23 NOTE — Progress Notes (Signed)
PROGRESS NOTE    [name]  ZTI:[id number] DOB: June 23, 1955 DOA: 08/12/2016 PCP: No primary care provider on file.    Brief Narrative:  61 year old with h/o DM, hypertension, chronic diastolic CHF, DVT, CAD, PVD, CVA with residual dysarthria coming in from SNF for persistent fevers. He was found to have bilateral heel ulcers , which have worsened over the last few weeks. This admission, ABI obtained showed severe / critical disease, vascular surgery consulted and transferred to [facility] for further work up/ intervention. Patient underwent bilateral AKA on 6/21. On the early hours of 6/22, pt had an episode of altered mental status possibly form oversedation from pain meds. CT head was negative for acute stroke. As continued to be lethargic , MRI of thebrain was also done and it was negative for acute intracranial pathology, he was found to have old strokes and small vessel disease.     Assessment & Plan:   Principal Problem:   Sepsis ([place]) Active Problems:   Enterococcal bacteremia   Bacteremia due to Escherichia coli   Osteomyelitis of right foot (HCC)   Proteus infection   Osteomyelitis of left foot (HCC)   Diabetes type 2, uncontrolled (HCC)   Hypertension   Chronic diastolic CHF (congestive heart failure) (HCC)   Peripheral neuropathy   Personal history of DVT (deep vein thrombosis)   History of DVT (deep vein thrombosis)   Coronary artery disease involving native coronary artery of native heart without angina pectoris   Cognitive and behavioral changes   Dysphagia due to recent stroke   AKI (acute kidney injury) ([place])   Pressure injury of skin   History of CVA (cerebrovascular accident)   Goals of care, counseling/discussion   Palliative care by specialist   Status post bilateral above knee amputation ([place])   Acute on chronic alteration in mental status  #1 sepsis secondary to osteomyelitis from bilateral heel ulcers, polymicrobial bacteremia Patient with clinical  improvement. Patient currently afebrile. Leukocytosis improved. CRP trending down. Repeat blood cultures from 08/18/2016 with 1 out of 2 coagulase negative staph likely a contaminant. Initial blood cultures from 08/12/2016 polymicrobial with Escherichia coli, Enterococcus faecalis, Proteus species. Patient initially on IV vancomycin, IV Flagyl and IV Rocephin which was subsequently discontinued per ID recommendations. Patient currently on IV Unasyn per ID recommendations and will need IV antibiotics for total of 14 days through 08/27/2016. Follow.  #2 PAD Patient status post bilateral AKA on 08/16/2016. Statin has been resumed. On Xarelto.  #3 coronary artery disease Stable. Continue statin and Coreg, BiDil.  #4 history of DVT Stable. Continue Xarelto.  #5 acute kidney injury Improved.   #6 history of chronic diastolic heart failure Currently euvolemic. Lasix on hold. Follow.  #7 history of dysphasia From prior CVA. Patient seen by speech therapy was recommended dysphagia 2 diet. Continue.  #8 anemia of chronic disease Stable. Hemoglobin dropped to 7.2 patient status post 1 unit packed red blood cells. Hemoglobin currently stable at 8.3.  #9 diabetes mellitus type 2 Hemoglobin A1c 8.3. Patient noted to be hypoglycemic with poor oral intake. CBGs 81-187. Decreased Lantus to 30 units daily. Sliding scale insulin.  #10 history of CVA Risk factor modification.  #11 acute encephalopathy Initially felt to be secondary to oversedation from pain meds. CT head was negative for any acute intracranial abnormality. MRI of the head was negative for any acute abnormalities. IV pain medication was discontinued. Clinical improvement.  #12 hypertension Continue Norvasc, Coreg, clonidine, BiDil.   DVT prophylaxis: Xarelto. Code  Status: Full Family Communication: Updated patient. No family at bedside. Disposition Plan: Likely home versus skilled nursing facility.   Consultants:   Vascular  surgery Dr Donzetta Matters.   Infectious disease Dr Megan Salon  Wound care  Palliative care consult.    Procedures:   ABIs  Bilateral AKA 08/16/2016--per Dr. Trula Slade  Antimicrobials:  IV Rocephin 08/13/2016>>>> 08/17/2016  IV Flagyl 08/12/2016 >>>>>> 08/17/2016   IV vancomycin 08/12/2016>>>>> 08/17/2016  IV Unasyn 08/17/2016   Subjective: Patient in bed easily arousable. Patient denies chest or shortness of breath. Patient asking about disposition to a skilled nursing facility. Patient denies bleeding.   Objective: Vitals:   08/22/16 2227 08/23/16 0042 08/23/16 0447 08/23/16 1124  BP: (!) 123/56 (!) 145/71 (!) 145/50 (!) 167/63  Pulse: 65  69   Resp: 18  18   Temp: 98.2 F (36.8 C)  97.9 F (36.6 C)   TempSrc: Oral  Oral   SpO2: 100%  99%   Weight:   89.8 kg (197 lb 14.4 oz)   Height:        Intake/Output Summary (Last 24 hours) at 08/23/16 1228 Last data filed at 08/23/16 0800  Gross per 24 hour  Intake              600 ml  Output              901 ml  Net             -301 ml   Filed Weights   08/21/16 0429 08/22/16 0423 08/23/16 0447  Weight: 89.1 kg (196 lb 6.4 oz) 88.3 kg (194 lb 9.6 oz) 89.8 kg (197 lb 14.4 oz)    Examination:  General exam: Appears calm and comfortable  Respiratory system: Clear to auscultation Anterior lung fields. Respiratory effort normal. Cardiovascular system: S1 & S2 heard, RRR. No JVD, murmurs, rubs, gallops or clicks. No pedal edema. Gastrointestinal system: Abdomen is nondistended, soft and nontender. No organomegaly or masses felt. Normal bowel sounds heard. Central nervous system: Alert and oriented. No focal neurological deficits. Extremities: Status post bilateral AKA. Skin: No rashes, lesions or ulcers Psychiatry: Judgement and insight appear fair. Mood & affect appropriate.     Data Reviewed: I have personally reviewed following labs and imaging studies  CBC:  Recent Labs Lab 08/19/16 0525 08/20/16 0548  08/21/16 0246 08/22/16 0551 08/23/16 0243  WBC 12.2* 10.7* 9.8 10.2 9.0  HGB 9.6* 8.4* 8.5* 8.6* 8.3*  HCT 30.8* 26.8* 27.7* 28.1* 26.9*  MCV 89.0 87.6 87.4 89.5 87.9  PLT 266 408* 423* 436* 166*   Basic Metabolic Panel:  Recent Labs Lab 08/16/16 1406 08/17/16 0258 08/18/16 0225 08/19/16 1745 08/23/16 0243  NA 141 137 140 137 142  K 4.2 4.5 4.0 3.7 3.5  CL  --  105 110 110 108  CO2  --  25 23 22 26   GLUCOSE 110* 203* 47* 224* 114*  BUN  --  15 17 9 7   CREATININE  --  1.12 1.66* 1.16 1.10  CALCIUM  --  8.0* 7.4* 7.4* 7.9*   GFR: Estimated Creatinine Clearance: 79.1 mL/min (by C-G formula based on SCr of 1.1 mg/dL). Liver Function Tests: No results for input(s): AST, ALT, ALKPHOS, BILITOT, PROT, ALBUMIN in the last 168 hours. No results for input(s): LIPASE, AMYLASE in the last 168 hours.  Recent Labs Lab 08/18/16 1249  AMMONIA 38*   Coagulation Profile: No results for input(s): INR, PROTIME in the last 168 hours. Cardiac Enzymes: No results for  input(s): CKTOTAL, CKMB, CKMBINDEX, TROPONINI in the last 168 hours. BNP (last 3 results) No results for input(s): PROBNP in the last 8760 hours. HbA1C: No results for input(s): HGBA1C in the last 72 hours. CBG:  Recent Labs Lab 08/22/16 1204 08/22/16 1623 08/22/16 2214 08/23/16 0656 08/23/16 1123  GLUCAP 73 74 90 81 89   Lipid Profile: No results for input(s): CHOL, HDL, LDLCALC, TRIG, CHOLHDL, LDLDIRECT in the last 72 hours. Thyroid Function Tests: No results for input(s): TSH, T4TOTAL, FREET4, T3FREE, THYROIDAB in the last 72 hours. Anemia Panel: No results for input(s): VITAMINB12, FOLATE, FERRITIN, TIBC, IRON, RETICCTPCT in the last 72 hours. Sepsis Labs:  Recent Labs Lab 08/18/16 1249 08/20/16 0548 08/22/16 0551  PROCALCITON 1.35 0.52 0.83  LATICACIDVEN 0.5  --   --     Recent Results (from the past 240 hour(s))  Culture, blood (routine x 2)     Status: None   Collection Time: 08/14/16  5:14 AM   Result Value Ref Range Status   Specimen Description BLOOD RIGHT HAND  Final   Special Requests IN PEDIATRIC BOTTLE Blood Culture adequate volume  Final   Culture   Final    NO GROWTH 5 DAYS Performed at [facility], [place] [address], [place], [place] [address]    Report Status 08/19/2016 FINAL  Final  Culture, blood (routine x 2)     Status: None   Collection Time: 08/14/16  5:44 AM  Result Value Ref Range Status   Specimen Description BLOOD RIGHT HAND  Final   Special Requests   Final    BOTTLES DRAWN AEROBIC ONLY Blood Culture adequate volume   Culture   Final    NO GROWTH 5 DAYS Performed at [facility], [place] [address] [place], [place] [address]    Report Status 08/19/2016 FINAL  Final  Culture, blood (Routine X 2) w Reflex to ID Panel     Status: None (Preliminary result)   Collection Time: 08/18/16 12:49 PM  Result Value Ref Range Status   Specimen Description BLOOD LEFT HAND  Final   Special Requests IN PEDIATRIC BOTTLE Blood Culture adequate volume  Final   Culture NO GROWTH 4 DAYS  Final   Report Status PENDING  Incomplete  Culture, blood (Routine X 2) w Reflex to ID Panel     Status: Abnormal   Collection Time: 08/18/16  1:01 PM  Result Value Ref Range Status   Specimen Description BLOOD RIGHT HAND  Final   Special Requests IN PEDIATRIC BOTTLE Blood Culture adequate volume  Final   Culture  Setup Time   Final    GRAM POSITIVE COCCI IN CLUSTERS AEROBIC BOTTLE ONLY CRITICAL RESULT CALLED TO, READ BACK BY AND VERIFIED WITH: A JONES,PHARMD AT 1229 08/19/16 BY L BENFIELD    Culture (A)  Final    STAPHYLOCOCCUS SPECIES (COAGULASE NEGATIVE) THE SIGNIFICANCE OF ISOLATING THIS ORGANISM FROM A SINGLE SET OF BLOOD CULTURES WHEN MULTIPLE SETS ARE DRAWN IS UNCERTAIN. PLEASE NOTIFY THE MICROBIOLOGY DEPARTMENT WITHIN ONE WEEK IF SPECIATION AND SENSITIVITIES ARE REQUIRED.    Report Status 08/21/2016 FINAL  Final  Blood Culture ID Panel (Reflexed)     Status: Abnormal    Collection Time: 08/18/16  1:01 PM  Result Value Ref Range Status   Enterococcus species NOT DETECTED NOT DETECTED Final   Listeria monocytogenes NOT DETECTED NOT DETECTED Final   Staphylococcus species DETECTED (A) NOT DETECTED Final    Comment: Methicillin (oxacillin) resistant coagulase negative staphylococcus.  Possible blood culture contaminant (unless isolated from more than one blood culture draw or clinical case suggests pathogenicity). No antibiotic treatment is indicated for blood  culture contaminants. CRITICAL RESULT CALLED TO, READ BACK BY AND VERIFIED WITH: A JONES,PHARMD AT 1229 08/19/16 BY L BENFIELD    Staphylococcus aureus NOT DETECTED NOT DETECTED Final   Methicillin resistance DETECTED (A) NOT DETECTED Final    Comment: CRITICAL RESULT CALLED TO, READ BACK BY AND VERIFIED WITH: A JONES,PHARMD AT 1229 08/19/16 BY L BENFIELD    Streptococcus species NOT DETECTED NOT DETECTED Final   Streptococcus agalactiae NOT DETECTED NOT DETECTED Final   Streptococcus pneumoniae NOT DETECTED NOT DETECTED Final   Streptococcus pyogenes NOT DETECTED NOT DETECTED Final   Acinetobacter baumannii NOT DETECTED NOT DETECTED Final   Enterobacteriaceae species NOT DETECTED NOT DETECTED Final   Enterobacter cloacae complex NOT DETECTED NOT DETECTED Final   Escherichia coli NOT DETECTED NOT DETECTED Final   Klebsiella oxytoca NOT DETECTED NOT DETECTED Final   Klebsiella pneumoniae NOT DETECTED NOT DETECTED Final   Proteus species NOT DETECTED NOT DETECTED Final   Serratia marcescens NOT DETECTED NOT DETECTED Final   Haemophilus influenzae NOT DETECTED NOT DETECTED Final   Neisseria meningitidis NOT DETECTED NOT DETECTED Final   Pseudomonas aeruginosa NOT DETECTED NOT DETECTED Final   Candida albicans NOT DETECTED NOT DETECTED Final   Candida glabrata NOT DETECTED NOT DETECTED Final   Candida krusei NOT DETECTED NOT DETECTED Final   Candida parapsilosis NOT DETECTED NOT DETECTED Final    Candida tropicalis NOT DETECTED NOT DETECTED Final         Radiology Studies: No results found.      Scheduled Meds: . amLODipine  10 mg Oral Daily  . atorvastatin  40 mg Oral q1800  . bethanechol  10 mg Oral TID  . carvedilol  25 mg Oral BID WC  . cloNIDine  0.3 mg Oral TID  . docusate sodium  100 mg Oral Daily  . feeding supplement (GLUCERNA SHAKE)  237 mL Oral QID  . feeding supplement (PRO-STAT SUGAR FREE 64)  30 mL Oral BID  . FLUoxetine  10 mg Oral Daily  . insulin aspart  0-15 Units Subcutaneous TID WC  . insulin aspart  0-5 Units Subcutaneous QHS  . insulin glargine  30 Units Subcutaneous QHS  . isosorbide-hydrALAZINE  2 tablet Oral TID  . mirtazapine  7.5 mg Oral QHS  . pantoprazole  40 mg Oral Daily  . polyethylene glycol  17 g Oral Daily  . QUEtiapine  50 mg Oral Daily  . rivaroxaban  20 mg Oral Daily  . senna-docusate  1 tablet Oral Daily  . tamsulosin  0.4 mg Oral QPC supper  . zinc sulfate  220 mg Oral Daily   Continuous Infusions: . sodium chloride    . sodium chloride    . ampicillin-sulbactam (UNASYN) IV 3 g (08/23/16 1128)  . magnesium sulfate 1 - 4 g bolus IVPB       LOS: 11 days    Time spent: 35 minutes    Brenin Heidelberger, MD [facility] Pager [phone]  If 7PM-7AM, please contact night-coverage [email or web address] Password [facility] 08/23/2016, 12:28 PM

## 2016-08-23 NOTE — Care Management Important Message (Signed)
Important Message  Patient Details  Name: [name] MRN: [id number] Date of Birth: [birth year]   Medicare Important Message Given:  Yes    Nathen May 08/23/2016, 10:02 AM

## 2016-08-23 NOTE — Progress Notes (Signed)
OT Cancellation Note  Patient Details Name: [name] MRN: [id number] DOB: [birth year]   Cancelled Treatment:    Reason Eval/Treat Not Completed: OT screened, no needs identified, will sign off. Pt evaluated 08/17/16. He is dependent at baseline in all ADL and bed level mobility. Did not recommend further OT.   Malka So 08/23/2016, 8:19 AM  [phone]

## 2016-08-24 LAB — RENAL FUNCTION PANEL
Albumin: 1.8 g/dL — ABNORMAL LOW (ref 3.5–5.0)
Anion gap: 7 (ref 5–15)
BUN: 5 mg/dL — ABNORMAL LOW (ref 6–20)
CHLORIDE: 106 mmol/L (ref 101–111)
CO2: 27 mmol/L (ref 22–32)
CREATININE: 1.2 mg/dL (ref 0.61–1.24)
Calcium: 7.7 mg/dL — ABNORMAL LOW (ref 8.9–10.3)
Glucose, Bld: 195 mg/dL — ABNORMAL HIGH (ref 65–99)
PHOSPHORUS: 3.6 mg/dL (ref 2.5–4.6)
Potassium: 3.8 mmol/L (ref 3.5–5.1)
Sodium: 140 mmol/L (ref 135–145)

## 2016-08-24 LAB — GLUCOSE, CAPILLARY
GLUCOSE-CAPILLARY: 186 mg/dL — AB (ref 65–99)
GLUCOSE-CAPILLARY: 75 mg/dL (ref 65–99)
Glucose-Capillary: 237 mg/dL — ABNORMAL HIGH (ref 65–99)
Glucose-Capillary: 140 mg/dL — ABNORMAL HIGH (ref 65–99)
Glucose-Capillary: 362 mg/dL — ABNORMAL HIGH (ref 65–99)

## 2016-08-24 LAB — CBC
HEMATOCRIT: 27.3 % — AB (ref 39.0–52.0)
Hemoglobin: 8.5 g/dL — ABNORMAL LOW (ref 13.0–17.0)
MCH: 27.2 pg (ref 26.0–34.0)
MCHC: 31.1 g/dL (ref 30.0–36.0)
MCV: 87.5 fL (ref 78.0–100.0)
PLATELETS: 477 10*3/uL — AB (ref 150–400)
RBC: 3.12 MIL/uL — AB (ref 4.22–5.81)
RDW: 15 % (ref 11.5–15.5)
WBC: 10.5 10*3/uL (ref 4.0–10.5)

## 2016-08-24 LAB — C-REACTIVE PROTEIN: CRP: 1.1 mg/dL — ABNORMAL HIGH (ref <1.0)

## 2016-08-24 LAB — SEDIMENTATION RATE: Sed Rate: 123 mm/hr — ABNORMAL HIGH (ref 0–16)

## 2016-08-24 MED ORDER — INSULIN GLARGINE 100 UNIT/ML ~~LOC~~ SOLN
40.0000 [IU] | Freq: Every day | SUBCUTANEOUS | Status: DC
  Administered 2016-08-24 – 2016-08-26 (×3): 40 [IU] via SUBCUTANEOUS
  Filled 2016-08-24 (×4): qty 0.4

## 2016-08-24 NOTE — Progress Notes (Signed)
Pt will go to live with daughter Marlyne Beards Address: [address] [place] [address] Phone: [phone]

## 2016-08-24 NOTE — Progress Notes (Signed)
PT Note     Durable Medical Equipment        Start     Ordered   08/24/16 1513  For home use only DME standard manual wheelchair with seat cushion  Once    Comments:  Patient suffers from bilateral AKA which impairs their ability to perform daily activities like [id number] in the home.  A walking [id number]:[id number] will not resolve  issue with performing activities of daily living. A wheelchair will allow patient to safely perform daily activities. Patient can safely propel the wheelchair in the home or has a caregiver who can provide assistance.  Accessories: elevating leg rests (ELRs), wheel locks, extensions and anti-tippers.   08/24/16 1513   08/24/16 1511  For home use only DME Other see comment  Once    Comments:  Harrel Lemon Lift   08/24/16 1511   08/24/16 1457  For home use only DME Hospital bed  Once    Question Answer Comment  Patient has (list medical condition): s/p B AKA/ bacteremia/PAD/osteomyelitis/debility   The above medical condition requires: Patient requires the ability to reposition frequently   Head must be elevated greater than: 30 degrees   Bed type Semi-electric   [employer] Yes   Trapeze Bar Yes   Support Surface: Gel Overlay      08/24/16 [place] PT [phone]

## 2016-08-24 NOTE — Consult Note (Signed)
   [facility] Inpatient Consult   08/24/2016  [name] 01-22-1956 [id number]    Faxed patient reassignment form to Dr. Noland Fordyce office. Sherry at office states she does not have an email to give Probation officer to email the form to. Fax confirmation received.  Spoke with [name] at bedside. He stated his Primary Care MD is Dr. Alyson Ingles. Made him aware that he was dismissed from Primary Care office in February of 2018. [name] was unaware of this. States he has not been to a different Primary Care office since. Of note, he has been in SNF.  Made inpatient RNCM aware of above.   Will follow up with [facility] Management Assistant Director to see if patient will be picked up by [facility] Management since he does not have a current Primary Care Provider.  Marthenia Rolling, MSN-Ed, RN,BSN [facility] Liaison [phone]

## 2016-08-24 NOTE — Consult Note (Signed)
   [facility] Inpatient Consult   08/24/2016  KAREEN HITSMAN [birth year] [id number]   Spoke with [facility] Management Assistant Director. Due to Mr. Champeau not having a current Primary Care MD, [facility] Management unable to follow.  Made inpatient RNCM aware of above.   Marthenia Rolling, MSN-Ed, RN,BSN [facility] Liaison [phone]

## 2016-08-24 NOTE — Progress Notes (Signed)
Pt was offered lunch tray by this writer and pt declined to eat. This is despite multiple attempts to help self feed and to provide assistance with feeding.  [name]

## 2016-08-24 NOTE — Consult Note (Addendum)
   [facility] Inpatient Consult   08/24/2016  [name] Jun 28, 1955 [id number]    [facility] Care Management referral received. Chart reviewed. Noted patient does not have a Primary Care Provider listed. Most recent Primary Care MD listed is Dr. Alyson Ingles.  Writer contacted Dr. Noland Fordyce office. Spoke with Judeen Hammans. Mr. Heims was dismissed from Dr. Noland Fordyce practice with [facility] effective February 2018.  Therefore, it does not appear patient has a current Primary Care Provider.   Will follow up with patient at bedside to confirm who is seeing for Primary Care, if anyone.  Will make inpatient RNCM aware above.  Will follow up with [facility] Management leadership to discuss and to find out how to proceed regarding referral.   Marthenia Rolling, MSN-Ed, RN,BSN [facility] Liaison [phone]

## 2016-08-24 NOTE — Care Management Note (Signed)
Case Management Note Marvetta Gibbons RN, BSN Unit 2W-Case Manager [phone]  Patient Details  Name: [name] MRN: [id number] Date of Birth: 1955-06-29  Subjective/Objective:  Pt admitted fever,  Osteomyelitis of foot, s/p bil AKA on 6/21                 Action/Plan: PTA pt was at [facility]- per CSW pt/family have out standing debt to [employer] and SNF will not take pt back until they make a payment- CSW working with family on plan- however- family may have to take pt home with [facility] services- they have been provided a private duty list per CSW- awaiting to hear from family   Expected Discharge Date:                  Expected Discharge Plan:  [place] Referral:  Clinical Social Work  Discharge planning Services  CM Consult, Medication Assistance  Post Acute Care Choice:  [place], Durable Medical Equipment Choice offered to:  Patient, Adult Children  DME Arranged:  Hospital bed, Wheelchair manual, Other see comment DME Agency:  [place]:  RN, PT, OT, Nurse's Aide, Social Work, IV Antibiotics HH Agency:  [place]  Status of Service:  Completed, signed off  If discussed at [employer] of Stay Meetings, dates discussed:  6/28  Discharge Disposition: home with home health  Additional Comments:   08/24/16- 1330- Marvetta Gibbons RN, CM- call made to pt's daughter [name] along with CSW-Ashley- discussion was had regarding d/c plans- per [name] she is prepared at this time to take her father home with [facility] services- she states she needs today to arrange furniture to make room for hospital bed- discussed needed DME- hospital bed, hoyer lift, wheelchair, - also spoke on medicaid application and where they are at in process- per [name] she has left multiple messages with caseworker. Choice offered for [facility] services will email copy of [facility] list for [facility] along with Private Duty list- went over the difference  between the two. Also discussed IV abx need until July 2- [name] voices that she feels she will be able to do this at home. Informed [name] that pt would need a PCP for [facility] services and that Dr. Alyson Ingles has dismissed her father from his practice- she will f/u on this next week to find out why?- in the meantime [name] gave verbal permission to use any MD that would assist in [facility] services. Confirmed Address- [place]- confirmed plan to transport by ambulance to daughter's home once DME has been delivered- daughter to let this CM know choice of agency later this afternoon.  1545- received call from [place]- pt's other daughter for update on conference call that she missed- updates provided and also emailed HH/private duty list per request to her also.  1600- call made to [facility] regarding [place] choice- per [name] they will use [facility] for services- referral called to karen with [place] for [facility]- RN/PT/OT/aide/sw- along with DME needs- also spoke with Pam for IV abx needs- PICC has been ordered- will not be placed until tomorrow per RN.  1700- spoke with Dr A - regarding d/c plans and need for [facility] (PCP and high risk)- Dr. Loni Muse has made the pt [place] and agreed to be overseeing MD to sign orders.    08/22/16- 1655- Labarron Durnin RN, CM- Received return call from pt's daughter Myrlene Broker- per conversation pt's other  daughter [name] is POA- and Velna Hatchet states that they do not have a "good relationship"- she reports that she works 3rd shift and sometimes a day job- recently moved here about a month ago and has an upstairs apartment that she does not feel like she has the space to accommodate her dad or offer a safe discharge to her place. She is concerned about finding a safe place- not sure about her sister's place as she has never been there. She was unaware of the financial situation and debt at the SNF- and thought her dad would be returning to [facility] for further rehab. Discussed that pt was medically stable  for discharge - Velna Hatchet states that there are no other family members to assist- discussed [place] options/private pay assistance/possible DME needs- she is not sure they can private pay and is not sure where dad would go- as he no longer has his own place following stroke they got rid of his place. Requested that she please let her sister know that CM and CSW have been trying to reach her and to please call us so that we can work on a safe discharge plan as we have left messages for [name] with no return phone call. Velna Hatchet states she will send sister a message. CM and CSW will continue to work with family to find a safe discharge for pt. Spoke with Pamala Hurry with [facility]- pt currently not appropriate for [facility].   Dawayne Patricia, RN 08/24/2016, 5:11 PM

## 2016-08-24 NOTE — Progress Notes (Signed)
Clinical Social Worker and RNCM spoke with daughter Marlyne Beards [phone]) in detail about patients discharge plan. [name] stated she will have patient discharge to her home ([place], [place] [address]). RNCM Steffanie Dunn will assist patient with home health needs and any DMEs patient may require.  Rhea Pink, MSW,  [place]

## 2016-08-24 NOTE — Progress Notes (Signed)
PROGRESS NOTE    [name]  UUV:[id number] DOB: January 29, 1956 DOA: 08/12/2016 PCP: No primary care provider on file.    Brief Narrative:  61 year old with h/o DM, hypertension, chronic diastolic CHF, DVT, CAD, PVD, CVA with residual dysarthria coming in from SNF for persistent fevers. He was found to have bilateral heel ulcers , which have worsened over the last few weeks. This admission, ABI obtained showed severe / critical disease, vascular surgery consulted and transferred to [facility] for further work up/ intervention. Patient underwent bilateral AKA on 6/21. On the early hours of 6/22, pt had an episode of altered mental status possibly form oversedation from pain meds. CT head was negative for acute stroke. As continued to be lethargic , MRI of thebrain was also done and it was negative for acute intracranial pathology, he was found to have old strokes and small vessel disease.     Assessment & Plan:   Principal Problem:   Sepsis ([place]) Active Problems:   Enterococcal bacteremia   Bacteremia due to Escherichia coli   Osteomyelitis of right foot (HCC)   Proteus infection   Osteomyelitis of left foot (HCC)   Diabetes type 2, uncontrolled (HCC)   Hypertension   Chronic diastolic CHF (congestive heart failure) (HCC)   Peripheral neuropathy   Personal history of DVT (deep vein thrombosis)   History of DVT (deep vein thrombosis)   Coronary artery disease involving native coronary artery of native heart without angina pectoris   Cognitive and behavioral changes   Dysphagia due to recent stroke   AKI (acute kidney injury) ([place])   Pressure injury of skin   History of CVA (cerebrovascular accident)   Goals of care, counseling/discussion   Palliative care by specialist   Status post bilateral above knee amputation ([place])   Acute on chronic alteration in mental status  #1 sepsis secondary to osteomyelitis from bilateral heel ulcers, polymicrobial bacteremia Patient with clinical  improvement. Patient currently afebrile. Leukocytosis improved. CRP trending down. Repeat blood cultures from 08/18/2016 with 1 out of 2 coagulase negative staph likely a contaminant. Initial blood cultures from 08/12/2016 polymicrobial with Escherichia coli, Enterococcus faecalis, Proteus species. Patient initially on IV vancomycin, IV Flagyl and IV Rocephin which was subsequently discontinued per ID recommendations. Patient currently on IV Unasyn per ID recommendations and will need IV antibiotics for total of 14 days through 08/27/2016. Follow.  #2 PAD Patient status post bilateral AKA on 08/16/2016. Statin has been resumed. On Xarelto.  #3 coronary artery disease Stable. Continue statin and Coreg, BiDil.  #4 history of DVT Stable. Continue Xarelto.  #5 acute kidney injury Improved.   #6 history of chronic diastolic heart failure Currently euvolemic. Lasix on hold. On discharge will resume Lasix at home regimen of when necessary. Follow.  #7 history of dysphasia From prior CVA. Patient seen by speech therapy was recommended dysphagia 2 diet. Continue.  #8 anemia of chronic disease Stable. Hemoglobin dropped to 7.2 patient status post 1 unit packed red blood cells. Hemoglobin currently stable at 8.5.  #9 diabetes mellitus type 2 Hemoglobin A1c 8.3. Patient noted to be hypoglycemic with poor oral intake. Patient with improved oral intake. CBGs [id number]. Increase Lantus to 40 units daily. Sliding scale insulin.  #10 history of CVA Risk factor modification.  #11 acute encephalopathy Initially felt to be secondary to oversedation from pain meds. CT head was negative for any acute intracranial abnormality. MRI of the head was negative for any acute abnormalities. IV pain medication was discontinued.  Clinical improvement.  #12 hypertension Continue Norvasc, Coreg, clonidine, BiDil.   DVT prophylaxis: Xarelto. Code Status: Full Family Communication: Updated patient. No family at  bedside. Disposition Plan: Likely home versus skilled nursing facility.   Consultants:   Vascular surgery Dr Donzetta Matters.   Infectious disease Dr Megan Salon  Wound care  Palliative care consult.    Procedures:   ABIs  Bilateral AKA 08/16/2016--per Dr. Trula Slade  PICC pending  Antimicrobials:  IV Rocephin 08/13/2016>>>> 08/17/2016  IV Flagyl 08/12/2016 >>>>>> 08/17/2016   IV vancomycin 08/12/2016>>>>> 08/17/2016  IV Unasyn 08/17/2016   Subjective: Patient in bed. Alert. Patient denies chest chest pain or shortness of breath.   Objective: Vitals:   08/23/16 2106 08/24/16 0042 08/24/16 0329 08/24/16 0920  BP: (!) 159/54 (!) 165/56 (!) 129/52 (!) 141/54  Pulse: 79  73 67  Resp: 18  20 14   Temp: 99.9 F (37.7 C)  98.4 F (36.9 C)   TempSrc: Oral  Oral   SpO2: 100%  96% 98%  Weight:   90 kg (198 lb 6.4 oz)   Height:        Intake/Output Summary (Last 24 hours) at 08/24/16 1020 Last data filed at 08/24/16 0500  Gross per 24 hour  Intake             1320 ml  Output             2002 ml  Net             -682 ml   Filed Weights   08/22/16 0423 08/23/16 0447 08/24/16 0329  Weight: 88.3 kg (194 lb 9.6 oz) 89.8 kg (197 lb 14.4 oz) 90 kg (198 lb 6.4 oz)    Examination:  General exam: Appears calm and comfortable  Respiratory system: Clear to auscultation Anterior lung fields. Respiratory effort normal. Cardiovascular system: S1 & S2 heard, RRR. No JVD, murmurs, rubs, gallops or clicks. No pedal edema. Gastrointestinal system: Abdomen is nondistended, soft and nontender. No organomegaly or masses felt. Normal bowel sounds heard. Central nervous system: Alert and oriented. No focal neurological deficits. Extremities: Status post bilateral AKA. Skin: No rashes, lesions or ulcers Psychiatry: Judgement and insight appear fair. Mood & affect appropriate.     Data Reviewed: I have personally reviewed following labs and imaging studies  CBC:  Recent Labs Lab  08/20/16 0548 08/21/16 0246 08/22/16 0551 08/23/16 0243 08/24/16 0209  WBC 10.7* 9.8 10.2 9.0 10.5  HGB 8.4* 8.5* 8.6* 8.3* 8.5*  HCT 26.8* 27.7* 28.1* 26.9* 27.3*  MCV 87.6 87.4 89.5 87.9 87.5  PLT 408* 423* 436* 451* 528*   Basic Metabolic Panel:  Recent Labs Lab 08/18/16 0225 08/19/16 1745 08/23/16 0243 08/24/16 0209  NA 140 137 142 140  K 4.0 3.7 3.5 3.8  CL 110 110 108 106  CO2 23 22 26 27   GLUCOSE 47* 224* 114* 195*  BUN 17 9 7  5*  CREATININE 1.66* 1.16 1.10 1.20  CALCIUM 7.4* 7.4* 7.9* 7.7*  PHOS  --   --   --  3.6   GFR: Estimated Creatinine Clearance: 72.6 mL/min (by C-G formula based on SCr of 1.2 mg/dL). Liver Function Tests:  Recent Labs Lab 08/24/16 0209  ALBUMIN 1.8*   No results for input(s): LIPASE, AMYLASE in the last 168 hours.  Recent Labs Lab 08/18/16 1249  AMMONIA 38*   Coagulation Profile: No results for input(s): INR, PROTIME in the last 168 hours. Cardiac Enzymes: No results for input(s): CKTOTAL, CKMB, CKMBINDEX, TROPONINI in  the last 168 hours. BNP (last 3 results) No results for input(s): PROBNP in the last 8760 hours. HbA1C: No results for input(s): HGBA1C in the last 72 hours. CBG:  Recent Labs Lab 08/23/16 1649 08/23/16 2048 08/23/16 2050 08/24/16 0040 08/24/16 0553  GLUCAP 187* >600* 244* 186* 237*   Lipid Profile: No results for input(s): CHOL, HDL, LDLCALC, TRIG, CHOLHDL, LDLDIRECT in the last 72 hours. Thyroid Function Tests: No results for input(s): TSH, T4TOTAL, FREET4, T3FREE, THYROIDAB in the last 72 hours. Anemia Panel: No results for input(s): VITAMINB12, FOLATE, FERRITIN, TIBC, IRON, RETICCTPCT in the last 72 hours. Sepsis Labs:  Recent Labs Lab 08/18/16 1249 08/20/16 0548 08/22/16 0551  PROCALCITON 1.35 0.52 0.83  LATICACIDVEN 0.5  --   --     Recent Results (from the past 240 hour(s))  Culture, blood (Routine X 2) w Reflex to ID Panel     Status: None   Collection Time: 08/18/16 12:49 PM   Result Value Ref Range Status   Specimen Description BLOOD LEFT HAND  Final   Special Requests IN PEDIATRIC BOTTLE Blood Culture adequate volume  Final   Culture NO GROWTH 5 DAYS  Final   Report Status 08/23/2016 FINAL  Final  Culture, blood (Routine X 2) w Reflex to ID Panel     Status: Abnormal   Collection Time: 08/18/16  1:01 PM  Result Value Ref Range Status   Specimen Description BLOOD RIGHT HAND  Final   Special Requests IN PEDIATRIC BOTTLE Blood Culture adequate volume  Final   Culture  Setup Time   Final    GRAM POSITIVE COCCI IN CLUSTERS AEROBIC BOTTLE ONLY CRITICAL RESULT CALLED TO, READ BACK BY AND VERIFIED WITH: A JONES,PHARMD AT 1229 08/19/16 BY L BENFIELD    Culture (A)  Final    STAPHYLOCOCCUS SPECIES (COAGULASE NEGATIVE) THE SIGNIFICANCE OF ISOLATING THIS ORGANISM FROM A SINGLE SET OF BLOOD CULTURES WHEN MULTIPLE SETS ARE DRAWN IS UNCERTAIN. PLEASE NOTIFY THE MICROBIOLOGY DEPARTMENT WITHIN ONE WEEK IF SPECIATION AND SENSITIVITIES ARE REQUIRED.    Report Status 08/21/2016 FINAL  Final  Blood Culture ID Panel (Reflexed)     Status: Abnormal   Collection Time: 08/18/16  1:01 PM  Result Value Ref Range Status   Enterococcus species NOT DETECTED NOT DETECTED Final   Listeria monocytogenes NOT DETECTED NOT DETECTED Final   Staphylococcus species DETECTED (A) NOT DETECTED Final    Comment: Methicillin (oxacillin) resistant coagulase negative staphylococcus. Possible blood culture contaminant (unless isolated from more than one blood culture draw or clinical case suggests pathogenicity). No antibiotic treatment is indicated for blood  culture contaminants. CRITICAL RESULT CALLED TO, READ BACK BY AND VERIFIED WITH: A JONES,PHARMD AT 1229 08/19/16 BY L BENFIELD    Staphylococcus aureus NOT DETECTED NOT DETECTED Final   Methicillin resistance DETECTED (A) NOT DETECTED Final    Comment: CRITICAL RESULT CALLED TO, READ BACK BY AND VERIFIED WITH: A JONES,PHARMD AT 1229 08/19/16 BY  L BENFIELD    Streptococcus species NOT DETECTED NOT DETECTED Final   Streptococcus agalactiae NOT DETECTED NOT DETECTED Final   Streptococcus pneumoniae NOT DETECTED NOT DETECTED Final   Streptococcus pyogenes NOT DETECTED NOT DETECTED Final   Acinetobacter baumannii NOT DETECTED NOT DETECTED Final   Enterobacteriaceae species NOT DETECTED NOT DETECTED Final   Enterobacter cloacae complex NOT DETECTED NOT DETECTED Final   Escherichia coli NOT DETECTED NOT DETECTED Final   Klebsiella oxytoca NOT DETECTED NOT DETECTED Final   Klebsiella pneumoniae NOT DETECTED NOT DETECTED  Final   Proteus species NOT DETECTED NOT DETECTED Final   Serratia marcescens NOT DETECTED NOT DETECTED Final   Haemophilus influenzae NOT DETECTED NOT DETECTED Final   Neisseria meningitidis NOT DETECTED NOT DETECTED Final   Pseudomonas aeruginosa NOT DETECTED NOT DETECTED Final   Candida albicans NOT DETECTED NOT DETECTED Final   Candida glabrata NOT DETECTED NOT DETECTED Final   Candida krusei NOT DETECTED NOT DETECTED Final   Candida parapsilosis NOT DETECTED NOT DETECTED Final   Candida tropicalis NOT DETECTED NOT DETECTED Final         Radiology Studies: No results found.      Scheduled Meds: . amLODipine  10 mg Oral Daily  . atorvastatin  40 mg Oral q1800  . bethanechol  10 mg Oral TID  . carvedilol  25 mg Oral BID WC  . cloNIDine  0.3 mg Oral TID  . docusate sodium  100 mg Oral Daily  . feeding supplement (GLUCERNA SHAKE)  237 mL Oral QID  . feeding supplement (PRO-STAT SUGAR FREE 64)  30 mL Oral BID  . FLUoxetine  10 mg Oral Daily  . insulin aspart  0-15 Units Subcutaneous TID WC  . insulin aspart  0-5 Units Subcutaneous QHS  . insulin glargine  40 Units Subcutaneous QHS  . isosorbide-hydrALAZINE  2 tablet Oral TID  . mirtazapine  7.5 mg Oral QHS  . pantoprazole  40 mg Oral Daily  . polyethylene glycol  17 g Oral Daily  . QUEtiapine  50 mg Oral Daily  . rivaroxaban  20 mg Oral Daily  .  senna-docusate  1 tablet Oral Daily  . tamsulosin  0.4 mg Oral QPC supper  . zinc sulfate  220 mg Oral Daily   Continuous Infusions: . ampicillin-sulbactam (UNASYN) IV Stopped (08/24/16 5520)  . magnesium sulfate 1 - 4 g bolus IVPB       LOS: 12 days    Time spent: 35 minutes    THOMPSON,DANIEL, MD [facility] Pager [phone]  If 7PM-7AM, please contact night-coverage [email or web address] Password [email or web address] 08/24/2016, 10:20 AM

## 2016-08-24 NOTE — Progress Notes (Signed)
Clinical Social Worker made APS report on June 28th,2018 after talking to Surveyor, quantity. CSW received text message from patients daughter [name]) stating that she works from 8am-5pm and her job requires that she be on the phone. CSW contacted patients daughter and was able to get her on the phone. "[name] stated that she has been very overwhelmed with th process and care a lot about her dad and making sure he is taken care of". [name] stated she has reached out to her family to try and see if they are able to help her come up with the money owed to [employer]. [name] stated she would like patient to return back to facility because she does not feel like she is able to care for the patient. CSW spoke to patient in length what her plan of action would be if family is unable tot come up with money to pay SNF. [name] stated she will have to think of other ways to take care of patient. CSW has arranged for a phone confrence between [place] and RNCM today at 1:30 to over discharge plan with family.  Rhea Pink, MSW,  [place]

## 2016-08-25 LAB — GLUCOSE, CAPILLARY
GLUCOSE-CAPILLARY: 99 mg/dL (ref 65–99)
GLUCOSE-CAPILLARY: 111 mg/dL — AB (ref 65–99)
GLUCOSE-CAPILLARY: 133 mg/dL — AB (ref 65–99)
Glucose-Capillary: 96 mg/dL (ref 65–99)

## 2016-08-25 LAB — BASIC METABOLIC PANEL
Anion gap: 8 (ref 5–15)
BUN: 6 mg/dL (ref 6–20)
CO2: 26 mmol/L (ref 22–32)
CREATININE: 1.22 mg/dL (ref 0.61–1.24)
Calcium: 7.9 mg/dL — ABNORMAL LOW (ref 8.9–10.3)
Chloride: 109 mmol/L (ref 101–111)
GFR calc Af Amer: >60 mL/min (ref >60)
GLUCOSE: 133 mg/dL — AB (ref 65–99)
Potassium: 3.5 mmol/L (ref 3.5–5.1)
SODIUM: 143 mmol/L (ref 135–145)

## 2016-08-25 LAB — C-REACTIVE PROTEIN: CRP: 0.9 mg/dL (ref <1.0)

## 2016-08-25 LAB — SEDIMENTATION RATE: SED RATE: 109 mm/h — AB (ref 0–16)

## 2016-08-25 MED ORDER — POTASSIUM CHLORIDE CRYS ER 20 MEQ PO TBCR
40.0000 meq | EXTENDED_RELEASE_TABLET | Freq: Once | ORAL | Status: AC
  Administered 2016-08-25: 40 meq via ORAL
  Filled 2016-08-25: qty 2

## 2016-08-25 MED ORDER — SODIUM CHLORIDE 0.9% FLUSH
10.0000 mL | INTRAVENOUS | Status: DC | PRN
  Administered 2016-08-26 – 2016-08-27 (×4): 10 mL
  Filled 2016-08-25 (×4): qty 40

## 2016-08-25 NOTE — Progress Notes (Signed)
PROGRESS NOTE    [name]  WJX:[id number] DOB: 07-28-1955 DOA: 08/12/2016 PCP: No primary care provider on file.    Brief Narrative:  61 year old with h/o DM, hypertension, chronic diastolic CHF, DVT, CAD, PVD, CVA with residual dysarthria coming in from SNF for persistent fevers. He was found to have bilateral heel ulcers , which have worsened over the last few weeks. This admission, ABI obtained showed severe / critical disease, vascular surgery consulted and transferred to [facility] for further work up/ intervention. Patient underwent bilateral AKA on 6/21. On the early hours of 6/22, pt had an episode of altered mental status possibly form oversedation from pain meds. CT head was negative for acute stroke. As continued to be lethargic , MRI of thebrain was also done and it was negative for acute intracranial pathology, he was found to have old strokes and small vessel disease.     Assessment & Plan:   Principal Problem:   Sepsis ([place]) Active Problems:   Enterococcal bacteremia   Bacteremia due to Escherichia coli   Osteomyelitis of right foot (HCC)   Proteus infection   Osteomyelitis of left foot (HCC)   Diabetes type 2, uncontrolled (HCC)   Hypertension   Chronic diastolic CHF (congestive heart failure) (HCC)   Peripheral neuropathy   Personal history of DVT (deep vein thrombosis)   History of DVT (deep vein thrombosis)   Coronary artery disease involving native coronary artery of native heart without angina pectoris   Cognitive and behavioral changes   Dysphagia due to recent stroke   AKI (acute kidney injury) ([place])   Pressure injury of skin   History of CVA (cerebrovascular accident)   Goals of care, counseling/discussion   Palliative care by specialist   Status post bilateral above knee amputation ([place])   Acute on chronic alteration in mental status  #1 sepsis secondary to osteomyelitis from bilateral heel ulcers, polymicrobial bacteremia Patient with clinical  improvement. Patient currently afebrile. Leukocytosis improved. CRP trending down. Repeat blood cultures from 08/18/2016 with 1 out of 2 coagulase negative staph likely a contaminant. Initial blood cultures from 08/12/2016 polymicrobial with Escherichia coli, Enterococcus faecalis, Proteus species. Patient initially on IV vancomycin, IV Flagyl and IV Rocephin which was subsequently discontinued per ID recommendations. Patient currently on IV Unasyn per ID recommendations and will need IV antibiotics for total of 14 days through 08/27/2016. Follow.  #2 PAD Patient status post bilateral AKA on 08/16/2016. Statin has been resumed. On Xarelto.  #3 coronary artery disease Stable. Continue statin and Coreg, BiDil.  #4 history of DVT Stable. Continue Xarelto.  #5 acute kidney injury Improved.   #6 history of chronic diastolic heart failure Currently euvolemic. Lasix on hold. On discharge will resume Lasix at home regimen of when necessary. Follow.  #7 history of dysphasia From prior CVA. Patient seen by speech therapy was recommended dysphagia 2 diet.   #8 anemia of chronic disease Stable. Hemoglobin dropped to 7.2 patient status post 1 unit packed red blood cells. Hemoglobin currently stable at 8.5.  #9 diabetes mellitus type 2 Hemoglobin A1c 8.3. Patient noted to be hypoglycemic with poor oral intake. Patient with improved oral intake. CBGs 99-111. Increased Lantus to 40 units daily. Sliding scale insulin.  #10 history of CVA Risk factor modification.  #11 acute encephalopathy Initially felt to be secondary to oversedation from pain meds. CT head was negative for any acute intracranial abnormality. MRI of the head was negative for any acute abnormalities. IV pain medication was discontinued.  Clinical improvement.  #12 hypertension Continue Norvasc, Coreg, clonidine, BiDil.   DVT prophylaxis: Xarelto. Code Status: Full Family Communication: Updated patient. No family at  bedside. Disposition Plan: Likely home versus skilled nursing facility.   Consultants:   Vascular surgery Dr Donzetta Matters.   Infectious disease Dr Megan Salon  Wound care  Palliative care consult.    Procedures:   ABIs  Bilateral AKA 08/16/2016--per Dr. Trula Slade  PICC pending  Antimicrobials:  IV Rocephin 08/13/2016>>>> 08/17/2016  IV Flagyl 08/12/2016 >>>>>> 08/17/2016   IV vancomycin 08/12/2016>>>>> 08/17/2016  IV Unasyn 08/17/2016   Subjective: Patient in bed. Alert. Patient denies shortness of breath. No chest pain.   Objective: Vitals:   08/24/16 1258 08/24/16 1355 08/24/16 2206 08/25/16 0515  BP: (!) 159/54 (!) 156/60 (!) 147/51 (!) 144/49  Pulse: 68 60 69 65  Resp: 17  18 18   Temp: 98.4 F (36.9 C) 99 F (37.2 C) 98 F (36.7 C) 98.5 F (36.9 C)  TempSrc: Oral Oral Oral Oral  SpO2: 99% 98% 98% 95%  Weight:    88.3 kg (194 lb 11.2 oz)  Height:        Intake/Output Summary (Last 24 hours) at 08/25/16 1012 Last data filed at 08/25/16 0520  Gross per 24 hour  Intake              720 ml  Output             2450 ml  Net            -1730 ml   Filed Weights   08/23/16 0447 08/24/16 0329 08/25/16 0515  Weight: 89.8 kg (197 lb 14.4 oz) 90 kg (198 lb 6.4 oz) 88.3 kg (194 lb 11.2 oz)    Examination:  General exam: Appears calm and comfortable  Respiratory system: Clear to auscultation Anterior lung fields. Respiratory effort normal. Cardiovascular system: S1 & S2 heard, RRR. No JVD, murmurs, rubs, gallops or clicks. No pedal edema. Gastrointestinal system: Abdomen is nondistended, soft and nontender. No organomegaly or masses felt. Normal bowel sounds heard. Central nervous system: Alert and oriented. No focal neurological deficits. Extremities: Status post bilateral AKA. Skin: No rashes, lesions or ulcers Psychiatry: Judgement and insight appear fair. Mood & affect appropriate.     Data Reviewed: I have personally reviewed following labs and imaging  studies  CBC:  Recent Labs Lab 08/20/16 0548 08/21/16 0246 08/22/16 0551 08/23/16 0243 08/24/16 0209  WBC 10.7* 9.8 10.2 9.0 10.5  HGB 8.4* 8.5* 8.6* 8.3* 8.5*  HCT 26.8* 27.7* 28.1* 26.9* 27.3*  MCV 87.6 87.4 89.5 87.9 87.5  PLT 408* 423* 436* 451* 128*   Basic Metabolic Panel:  Recent Labs Lab 08/19/16 1745 08/23/16 0243 08/24/16 0209 08/25/16 0140  NA 137 142 140 143  K 3.7 3.5 3.8 3.5  CL 110 108 106 109  CO2 22 26 27 26   GLUCOSE 224* 114* 195* 133*  BUN 9 7 5* 6  CREATININE 1.16 1.10 1.20 1.22  CALCIUM 7.4* 7.9* 7.7* 7.9*  PHOS  --   --  3.6  --    GFR: Estimated Creatinine Clearance: 70.8 mL/min (by C-G formula based on SCr of 1.22 mg/dL). Liver Function Tests:  Recent Labs Lab 08/24/16 0209  ALBUMIN 1.8*   No results for input(s): LIPASE, AMYLASE in the last 168 hours.  Recent Labs Lab 08/18/16 1249  AMMONIA 38*   Coagulation Profile: No results for input(s): INR, PROTIME in the last 168 hours. Cardiac Enzymes: No results for input(s):  CKTOTAL, CKMB, CKMBINDEX, TROPONINI in the last 168 hours. BNP (last 3 results) No results for input(s): PROBNP in the last 8760 hours. HbA1C: No results for input(s): HGBA1C in the last 72 hours. CBG:  Recent Labs Lab 08/24/16 0553 08/24/16 1124 08/24/16 1727 08/24/16 2157 08/25/16 0606  GLUCAP 237* 75 140* 362* 99   Lipid Profile: No results for input(s): CHOL, HDL, LDLCALC, TRIG, CHOLHDL, LDLDIRECT in the last 72 hours. Thyroid Function Tests: No results for input(s): TSH, T4TOTAL, FREET4, T3FREE, THYROIDAB in the last 72 hours. Anemia Panel: No results for input(s): VITAMINB12, FOLATE, FERRITIN, TIBC, IRON, RETICCTPCT in the last 72 hours. Sepsis Labs:  Recent Labs Lab 08/18/16 1249 08/20/16 0548 08/22/16 0551  PROCALCITON 1.35 0.52 0.83  LATICACIDVEN 0.5  --   --     Recent Results (from the past 240 hour(s))  Culture, blood (Routine X 2) w Reflex to ID Panel     Status: None    Collection Time: 08/18/16 12:49 PM  Result Value Ref Range Status   Specimen Description BLOOD LEFT HAND  Final   Special Requests IN PEDIATRIC BOTTLE Blood Culture adequate volume  Final   Culture NO GROWTH 5 DAYS  Final   Report Status 08/23/2016 FINAL  Final  Culture, blood (Routine X 2) w Reflex to ID Panel     Status: Abnormal   Collection Time: 08/18/16  1:01 PM  Result Value Ref Range Status   Specimen Description BLOOD RIGHT HAND  Final   Special Requests IN PEDIATRIC BOTTLE Blood Culture adequate volume  Final   Culture  Setup Time   Final    GRAM POSITIVE COCCI IN CLUSTERS AEROBIC BOTTLE ONLY CRITICAL RESULT CALLED TO, READ BACK BY AND VERIFIED WITH: A JONES,PHARMD AT 1229 08/19/16 BY L BENFIELD    Culture (A)  Final    STAPHYLOCOCCUS SPECIES (COAGULASE NEGATIVE) THE SIGNIFICANCE OF ISOLATING THIS ORGANISM FROM A SINGLE SET OF BLOOD CULTURES WHEN MULTIPLE SETS ARE DRAWN IS UNCERTAIN. PLEASE NOTIFY THE MICROBIOLOGY DEPARTMENT WITHIN ONE WEEK IF SPECIATION AND SENSITIVITIES ARE REQUIRED.    Report Status 08/21/2016 FINAL  Final  Blood Culture ID Panel (Reflexed)     Status: Abnormal   Collection Time: 08/18/16  1:01 PM  Result Value Ref Range Status   Enterococcus species NOT DETECTED NOT DETECTED Final   Listeria monocytogenes NOT DETECTED NOT DETECTED Final   Staphylococcus species DETECTED (A) NOT DETECTED Final    Comment: Methicillin (oxacillin) resistant coagulase negative staphylococcus. Possible blood culture contaminant (unless isolated from more than one blood culture draw or clinical case suggests pathogenicity). No antibiotic treatment is indicated for blood  culture contaminants. CRITICAL RESULT CALLED TO, READ BACK BY AND VERIFIED WITH: A JONES,PHARMD AT 1229 08/19/16 BY L BENFIELD    Staphylococcus aureus NOT DETECTED NOT DETECTED Final   Methicillin resistance DETECTED (A) NOT DETECTED Final    Comment: CRITICAL RESULT CALLED TO, READ BACK BY AND VERIFIED  WITH: A JONES,PHARMD AT 1229 08/19/16 BY L BENFIELD    Streptococcus species NOT DETECTED NOT DETECTED Final   Streptococcus agalactiae NOT DETECTED NOT DETECTED Final   Streptococcus pneumoniae NOT DETECTED NOT DETECTED Final   Streptococcus pyogenes NOT DETECTED NOT DETECTED Final   Acinetobacter baumannii NOT DETECTED NOT DETECTED Final   Enterobacteriaceae species NOT DETECTED NOT DETECTED Final   Enterobacter cloacae complex NOT DETECTED NOT DETECTED Final   Escherichia coli NOT DETECTED NOT DETECTED Final   Klebsiella oxytoca NOT DETECTED NOT DETECTED Final   Klebsiella  pneumoniae NOT DETECTED NOT DETECTED Final   Proteus species NOT DETECTED NOT DETECTED Final   Serratia marcescens NOT DETECTED NOT DETECTED Final   Haemophilus influenzae NOT DETECTED NOT DETECTED Final   Neisseria meningitidis NOT DETECTED NOT DETECTED Final   Pseudomonas aeruginosa NOT DETECTED NOT DETECTED Final   Candida albicans NOT DETECTED NOT DETECTED Final   Candida glabrata NOT DETECTED NOT DETECTED Final   Candida krusei NOT DETECTED NOT DETECTED Final   Candida parapsilosis NOT DETECTED NOT DETECTED Final   Candida tropicalis NOT DETECTED NOT DETECTED Final         Radiology Studies: No results found.      Scheduled Meds: . amLODipine  10 mg Oral Daily  . atorvastatin  40 mg Oral q1800  . bethanechol  10 mg Oral TID  . carvedilol  25 mg Oral BID WC  . cloNIDine  0.3 mg Oral TID  . docusate sodium  100 mg Oral Daily  . feeding supplement (GLUCERNA SHAKE)  237 mL Oral QID  . feeding supplement (PRO-STAT SUGAR FREE 64)  30 mL Oral BID  . FLUoxetine  10 mg Oral Daily  . insulin aspart  0-15 Units Subcutaneous TID WC  . insulin aspart  0-5 Units Subcutaneous QHS  . insulin glargine  40 Units Subcutaneous QHS  . isosorbide-hydrALAZINE  2 tablet Oral TID  . mirtazapine  7.5 mg Oral QHS  . pantoprazole  40 mg Oral Daily  . polyethylene glycol  17 g Oral Daily  . QUEtiapine  50 mg Oral  Daily  . rivaroxaban  20 mg Oral Daily  . senna-docusate  1 tablet Oral Daily  . tamsulosin  0.4 mg Oral QPC supper  . zinc sulfate  220 mg Oral Daily   Continuous Infusions: . ampicillin-sulbactam (UNASYN) IV Stopped (08/25/16 7078)  . magnesium sulfate 1 - 4 g bolus IVPB       LOS: 13 days    Time spent: 35 minutes    THOMPSON,DANIEL, MD [facility] Pager [phone]  If 7PM-7AM, please contact night-coverage [email or web address] Password [email or web address] 08/25/2016, 10:12 AM

## 2016-08-26 DIAGNOSIS — R339 Retention of urine, unspecified: Secondary | ICD-10-CM

## 2016-08-26 DIAGNOSIS — R509 Fever, unspecified: Secondary | ICD-10-CM

## 2016-08-26 LAB — BASIC METABOLIC PANEL
Anion gap: 6 (ref 5–15)
BUN: 7 mg/dL (ref 6–20)
CHLORIDE: 108 mmol/L (ref 101–111)
CO2: 27 mmol/L (ref 22–32)
CREATININE: 1.2 mg/dL (ref 0.61–1.24)
Calcium: 8.1 mg/dL — ABNORMAL LOW (ref 8.9–10.3)
GFR calc Af Amer: >60 mL/min (ref >60)
GFR calc non Af Amer: >60 mL/min (ref >60)
Glucose, Bld: 129 mg/dL — ABNORMAL HIGH (ref 65–99)
Potassium: 4 mmol/L (ref 3.5–5.1)
SODIUM: 141 mmol/L (ref 135–145)

## 2016-08-26 LAB — GLUCOSE, CAPILLARY
GLUCOSE-CAPILLARY: 90 mg/dL (ref 65–99)
GLUCOSE-CAPILLARY: 110 mg/dL — AB (ref 65–99)
Glucose-Capillary: 88 mg/dL (ref 65–99)

## 2016-08-26 LAB — SEDIMENTATION RATE: Sed Rate: 111 mm/hr — ABNORMAL HIGH (ref 0–16)

## 2016-08-26 LAB — C-REACTIVE PROTEIN

## 2016-08-26 MED ORDER — INSULIN GLARGINE 300 UNIT/ML ~~LOC~~ SOPN: 40.0000 [IU] | PEN_INJECTOR | Freq: Every day | SUBCUTANEOUS | 0 refills | Status: DC

## 2016-08-26 MED ORDER — AMPICILLIN-SULBACTAM IV (FOR PTA / DISCHARGE USE ONLY)
3.0000 g | Freq: Four times a day (QID) | INTRAVENOUS | 0 refills | Status: AC
Start: 2016-08-26 — End: 2016-08-31

## 2016-08-26 NOTE — Plan of Care (Signed)
Problem: Skin Integrity: Goal: Risk for impaired skin integrity will decrease Outcome: Progressing Will need reinforcement.

## 2016-08-26 NOTE — Care Management (Signed)
    Durable Medical Equipment        Start     Ordered   08/24/16 1513  For home use only DME standard manual wheelchair with seat cushion  Once    Comments:  Patient suffers from bilateral AKA which impairs their ability to perform daily activities like [id number] in the home.  A walking [id number] will not resolve  issue with performing activities of daily living. A wheelchair will allow patient to safely perform daily activities. Patient can safely propel the wheelchair in the home or has a caregiver who can provide assistance.  Accessories: elevating leg rests (ELRs), wheel locks, extensions and anti-tippers.   08/24/16 1513   08/24/16 1511  For home use only DME Other see comment  Once    Comments:  Harrel Lemon Lift   08/24/16 1511   08/24/16 1457  For home use only DME Hospital bed  Once    Question Answer Comment  Patient has (list medical condition): s/p B AKA/ bacteremia/PAD/osteomyelitis/debility   The above medical condition requires: Patient requires the ability to reposition frequently   Head must be elevated greater than: 30 degrees   Bed type Semi-electric   Hoyer Lift Yes   Trapeze Bar Yes   Support Surface: Gel Overlay      08/24/16 1458   08/24/16 1455  For home use only DME 4 wheeled rolling walker with seat  Once    Question:  Patient needs a walker to treat with the following condition  Answer:  S/P AKA (above knee amputation) bilateral ([place])   08/24/16 1458

## 2016-08-26 NOTE — Progress Notes (Addendum)
Patient with order to DC to home w PICC and IV Abx through [facility] ending 7/2. LM with daughter [name] to call back and discuss delivery of DME and transportation home. If patient needs PTAR, WC will need to be delivered to home w bed. No answer in patients room or RN station. Will continue to try.  Per CSW patient will need PTAR. DC to [place] [id number]." Brenton Grills w [facility] notified that [facility] will need to be delivered with bed at home as PTAR cannot transport equipment. CM will place PTAR papers on chart. RN please call daughter [name] at [phone] to verify hospital bed and WC delivered and someone is at residence to let PTAR in. Then call PTAR at [phone] to schedule transport home today.   Spoke with bedside RN Lenna Sciara, patient will have to receive last dose for today at hospital prior to DC, ~11:00. [facility] to initiate home therapy tomorrow AM. Lenna Sciara aware to give dose as early as acceptable to facilitate DC. Spoke w patient's daughter [name]. She is understanding of late DC tonight.  Equipment to be delivered today by 7pm. Due to very late timing of tonight's dose, patient very likely to cross over midnight prior to DC even if it were to be given early. Therefore decision made with provider to DC in AM after 5:30 dose, RN, daughter, and [facility] updated.

## 2016-08-26 NOTE — Discharge Summary (Signed)
Physician Discharge Summary  [name] LFY:[id number] DOB: 09/07/1955 DOA: 08/12/2016  PCP: No primary care provider on file.  Admit date: 08/12/2016 Discharge date: 08/26/2016  Time spent: 55 minutes  Recommendations for Outpatient Follow-up:  1. See prior discharge summary per Dr.Akula   Discharge Diagnoses:  Principal Problem:   Sepsis ([place]) Active Problems:   Enterococcal bacteremia   Bacteremia due to Escherichia coli   Osteomyelitis of Johnson foot ([place])   Proteus infection   Osteomyelitis of Johnson foot ([place])   Diabetes type 2, uncontrolled (HCC)   Hypertension   Chronic diastolic CHF (congestive heart failure) ([place])   Peripheral neuropathy   Personal history of DVT (deep vein thrombosis)   History of DVT (deep vein thrombosis)   Coronary artery disease involving native coronary artery of native heart without angina pectoris   Cognitive and behavioral changes   Urinary retention   Dysphagia due to recent stroke   AKI (acute kidney injury) ([place])   Pressure injury of skin   History of CVA (cerebrovascular accident)   Goals of care, counseling/discussion   Palliative care by specialist   Status post bilateral above knee amputation ([place])   Acute on chronic alteration in mental status   Fever   Discharge Condition: Stable and improved  Diet recommendation: Carb modified  Filed Weights   08/24/16 0329 08/25/16 0515 08/26/16 0459  Weight: 90 kg (198 lb 6.4 oz) 88.3 kg (194 lb 11.2 oz) 88.1 kg (194 lb 3.2 oz)    History of present illness:  See prior discharge summary dictated by [facility] 08/21/2016  Hospital Course:  Patient was initially to be discharged on 08/21/2016 however unable to be placed in a skilled nursing facility. Patient was monitored on the floor and family was contacted by social work and Tourist information centre manager in order to arrange for safe disposition. Patient remained stable and patient's insulin doses were adjusted for better CBG control. For the rest of the  hospitalization please see Dr. Petra Kuba discharge summary from 08/21/2016.  Procedures:  ABIs  Bilateral AKA 08/16/2016--per Dr. Trula Slade  PICC 08/25/2016   Consultations:  Vascular surgery Dr Donzetta Matters.   Infectious disease Dr Megan Salon  Wound care  Palliative care consult.    Discharge Exam: Vitals:   08/25/16 2023 08/26/16 0459  BP: (!) 114/53 (!) 162/60  Pulse: 65 63  Resp: 19 19  Temp: 97.8 F (36.6 C) 97.5 F (36.4 C)    General: NAD Cardiovascular: RRR Respiratory: CTAB anterior lung fields.  Discharge Instructions   Discharge Instructions    Diet Carb Modified    Complete by:  As directed    Home infusion instructions [facility] May follow [place] Dosing Protocol; May administer Cathflo as needed to maintain patency of vascular access device.; Flushing of vascular access device: per [facility] Protocol: 0.9% NaCl pre/post medica...    Complete by:  As directed    Instructions:  May follow [place] Dosing Protocol   Instructions:  May administer Cathflo as needed to maintain patency of vascular access device.   Instructions:  Flushing of vascular access device: per [facility] Protocol: 0.9% NaCl pre/post medication administration and prn patency; Heparin 100 u/ml, 63m for implanted ports and Heparin 10u/ml, 556mfor all other central venous catheters.   Instructions:  May follow [facility] Anaphylaxis Protocol for First Dose Administration in the home: 0.9% NaCl at 25-50 ml/hr to maintain IV access for protocol meds. Epinephrine 0.3 ml IV/IM PRN and Benadryl 25-50 IV/IM PRN s/s of anaphylaxis.  Instructions:  [place] In[facility] Coordinator (RN) to assist per patient IV care needs in the home PRN.   Increase activity slowly    Complete by:  As directed      Current Discharge Medication List    START taking these medications   Details  ampicillin-sulbactam (UNASYN) IVPB Inject 3 g into the vein every 6 (six) hours. Indication:  Bacteremia Last Day of  Therapy:  08/27/16 Labs - Once weekly:  CBC/D and BMP, Labs - Every other week:  ESR and CRP Qty: 20 Units, Refills: 0    Ampicillin-Sulbactam 3 g in sodium chloride 0.9 % 100 mL Inject 3 g into the vein every 6 (six) hours.    docusate sodium (COLACE) 100 MG capsule Take 1 capsule (100 mg total) by mouth daily. Qty: 10 capsule, Refills: 0    guaiFENesin-dextromethorphan (ROBITUSSIN DM) 100-10 MG/5ML syrup Take 15 mLs by mouth every 4 (four) hours as needed for cough. Qty: 118 mL, Refills: 0    Maltodextrin-Xanthan Gum (RESOURCE THICKENUP CLEAR) POWD As needed.    pantoprazole (PROTONIX) 40 MG tablet Take 1 tablet (40 mg total) by mouth daily.    traMADol (ULTRAM) 50 MG tablet Take 2 tablets (100 mg total) by mouth every 6 (six) hours as needed for moderate pain. Qty: 20 tablet, Refills: 0      CONTINUE these medications which have CHANGED   Details  Insulin Glargine (TOUJEO SOLOSTAR) 300 UNIT/ML SOPN Inject 40 Units into the skin at bedtime. Qty: 5 pen, Refills: 0      CONTINUE these medications which have NOT CHANGED   Details  acetaminophen (TYLENOL) 160 MG/5ML suspension Take 650 mg by mouth as needed for mild pain. 20.3 ml ( 650 mg total)     albuterol (PROVENTIL) (2.5 MG/3ML) 0.083% nebulizer solution Take 3 mLs (2.5 mg total) by nebulization every 2 (two) hours as needed for shortness of breath. Qty: 75 mL, Refills: 12    Amino Acids-Protein Hydrolys (FEEDING SUPPLEMENT, PRO-STAT SUGAR FREE 64,) LIQD Take 30 mLs by mouth 2 (two) times daily.    amLODipine (NORVASC) 10 MG tablet Take 1 tablet (10 mg total) by mouth daily.    atorvastatin (LIPITOR) 40 MG tablet Take 1 tablet (40 mg total) by mouth daily at 6 PM. Qty: 30 tablet, Refills: 0    bethanechol (URECHOLINE) 10 MG tablet Take 1 tablet (10 mg total) by mouth 3 (three) times daily.    bisacodyl (DULCOLAX) 10 MG suppository Place 10 mg rectally as needed for moderate constipation.    carvedilol (COREG) 25 MG  tablet Take 1 tablet (25 mg total) by mouth 2 (two) times daily with a meal. Qty: 60 tablet, Refills: 0    cloNIDine (CATAPRES) 0.3 MG tablet Take 1 tablet (0.3 mg total) by mouth 3 (three) times daily. Qty: 60 tablet, Refills: 11    FLUoxetine (PROZAC) 10 MG capsule Take 1 capsule (10 mg total) by mouth daily. Refills: 3    furosemide (LASIX) 40 MG tablet Take 40 mg by mouth daily as needed for edema.    GLUCERNA (GLUCERNA) LIQD Take 237 mLs by mouth 4 (four) times daily.    hydrocortisone (ANUSOL-HC) 2.5 % rectal cream Place rectally 2 (two) times daily as needed for hemorrhoids or itching. Qty: 30 g, Refills: 0    insulin aspart (NOVOLOG) 100 UNIT/ML FlexPen Inject into the skin 3 (three) times daily with meals. CBG with meals and SSI as follows less than 150 no insulin, 151 -  200 = 3 units, 201 - 250 = 5 units, 251 - 300 = 8 units, 301 - 350 = 11 units, 351 - 400 = 15 units, greater than 400 give 15 units and call MD    isosorbide-hydrALAZINE (BIDIL) 20-37.5 MG tablet Take 2 tablets by mouth 3 (three) times daily. Qty: 90 tablet, Refills: 0    lidocaine (XYLOCAINE) 2 % jelly Apply topically as needed (Use with in and out catheter). Qty: 30 mL, Refills: 0    mirtazapine (REMERON) 7.5 MG tablet Take 1 tablet (7.5 mg total) by mouth at bedtime.    Multiple Vitamins-Minerals (MULTIVITAMIN PO) Take 1 tablet by mouth daily.    ondansetron (ZOFRAN-ODT) 4 MG disintegrating tablet Take 4 mg by mouth every 6 (six) hours as needed for nausea or vomiting.     polyethylene glycol (MIRALAX / GLYCOLAX) packet Take 17 g by mouth daily. Qty: 14 each, Refills: 0    QUEtiapine (SEROQUEL) 25 MG tablet Take 25 mg by mouth at bedtime.     rivaroxaban (XARELTO) 20 MG TABS tablet Take 1 tablet (20 mg total) by mouth daily with supper. Qty: 30 tablet    senna-docusate (SENOKOT-S) 8.6-50 MG tablet Take 1 tablet by mouth daily. Qty: 30 tablet, Refills: 0    Sodium Phosphates (RA SALINE ENEMA) 19-7  GM/118ML ENEM Place 1 each rectally as needed (for constipation).    tamsulosin (FLOMAX) 0.4 MG CAPS capsule Take 1 capsule (0.4 mg total) by mouth daily after supper. Qty: 30 capsule    zinc sulfate 220 (50 Zn) MG capsule Take 220 mg by mouth daily.      STOP taking these medications     hydrocerin (EUCERIN) CREA      nitroGLYCERIN (NITROSTAT) 0.4 MG SL tablet        Allergies  Allergen Reactions  . Iohexol Hives  . Ivp Dye [Iodinated Diagnostic Agents] Hives    Contact information for follow-up providers    Serafina Mitchell, MD Follow up in 4 week(s).   Specialties:  Vascular Surgery, Cardiology Why:  Our office will call you to arrange an appointment  Contact information: [place] [address] [phone]        [facility] - Dme Follow up.   Why:  hospital bed, hoyer lift, wheelchair arranged- to be delivered to home prior to discharge Contact information: [place] [address] [phone]        [facility]-Home Follow up.   Why:  HHRN/PT/OT/aide/social work arranged- (iv abx until 7/2)- they will contact you to set up home visits Contact information: [address] [place] [address] [phone]        pcp. Schedule an appointment as soon as possible for a visit in 2 week(s).   Why:  f/u with PCP in 2 weeks.       [facility]. Schedule an appointment as soon as possible for a visit in 1 week(s).   Why:  f/u for voiding trial Contact information: [place] [address] [phone]            Contact information for after-discharge care    Destination    [facility] .   Specialty:  [place] information: [address] [place] [phone]                   The  results of significant diagnostics from this hospitalization (including imaging,  microbiology, ancillary and laboratory) are listed below for reference.    Significant Diagnostic Studies: Dg Chest 2 View  Result Date: 08/12/2016 CLINICAL DATA:  Initial evaluation for acute fever. EXAM: CHEST  2 VIEW COMPARISON:  Prior radiograph and CT from 05/10/2016. FINDINGS: Mild cardiomegaly, stable.  Mediastinal silhouette normal. Lungs hypoinflated. Mild perihilar vascular congestion without overt pulmonary edema. Patchy and linear bibasilar opacities, favored to reflect atelectasis and/ or bronchovascular crowding, although superimposed infiltrate not entirely excluded, particularly at the Johnson lung base. No pleural effusion. No pneumothorax. No acute osseous abnormality. IMPRESSION: 1. Shallow lung inflation with mild patchy and linear bibasilar opacities. Atelectasis/bronchovascular crowding is favored, although superimposed infiltrates could be considered in the correct clinical setting, particularly at the Johnson lung base. 2. Cardiomegaly with mild perihilar vascular congestion without overt pulmonary edema. Electronically Signed   By: Jeannine Boga M.D.   On: 08/12/2016 18:07   Dg Ankle Complete Johnson  Result Date: 08/12/2016 CLINICAL DATA:  Initial evaluation for soft tissue ulceration at heel. Rule out osteo. EXAM: Johnson ANKLE COMPLETE - 3+ VIEW COMPARISON:  None. FINDINGS: Soft tissue irregularity at the plantar aspect of the heel, consistent with history of ulceration. Few scatter locular is of soft tissue emphysema present within this region due to ulceration. No radiopaque foreign body. No radiographic findings to suggest osteomyelitis. No acute fracture dislocation. Ankle mortise approximated. Vascular calcifications noted within the lower leg. IMPRESSION: 1. Soft tissue irregularity at the heel, compatible with history of ulceration in this region. Few scatter locked fills of soft tissue emphysema related to ulceration without radiographic evidence for osteomyelitis. No  radiopaque foreign body. 2. No acute osseous abnormality about the ankle. Electronically Signed   By: Jeannine Boga M.D.   On: 08/12/2016 18:09   Dg Ankle Complete Johnson  Result Date: 08/12/2016 CLINICAL DATA:  Initial evaluation for heel ulcer. Rule out osteomyelitis. EXAM: Johnson ANKLE - COMPLETE 3+ VIEW COMPARISON:  None. FINDINGS: No acute fracture or dislocation.  Ankle mortise approximated. Soft tissue irregularity at overlying the lateral malleolus suspicious for ulceration. There is underlying cortical disruption of the distal fibula, suspicious for possible osteomyelitis. No frank periosteal reaction. No dissecting soft tissue emphysema. No radiopaque foreign body. IMPRESSION: 1. Soft tissue irregularity overlying the lateral malleolus, consistent with ulceration. Underlying cortical erosion suspicious for osteomyelitis. 2. No other acute osseous abnormality about the ankle. Electronically Signed   By: Jeannine Boga M.D.   On: 08/12/2016 18:13   Ct Head Wo Contrast  Result Date: 08/17/2016 CLINICAL DATA:  Acute on chronic alteration in mental status. Slurred speech. EXAM: CT HEAD WITHOUT CONTRAST TECHNIQUE: Contiguous axial images were obtained from the base of the skull through the vertex without intravenous contrast. COMPARISON:  04/24/2016 FINDINGS: Johnson: No acute hemorrhage. Stable degree of atrophy and chronic small vessel ischemia, advanced for age. Remote small lacunar infarct in the Johnson pons. No evidence for acute ischemia. No mass effect or midline shift. No subdural or extra-axial fluid collection. No hydrocephalus. Vascular: Atherosclerosis of skullbase vasculature without hyperdense vessel or abnormal calcification. Skull: No fracture or focal lesion. Sinuses/Orbits: Paranasal sinuses and mastoid air cells are clear. The visualized orbits are unremarkable. Bilateral cataract resection. Other: None. IMPRESSION: 1. No evidence of acute intracranial abnormality. 2. Stable  atrophy and chronic small vessel ischemia, advanced for age. Electronically Signed   By: Jeb Levering M.D.   On: 08/17/2016 02:49   [name] Wo Contrast  Result Date: 08/19/2016 CLINICAL DATA:  Altered mental status, possible reaction to pain medication, status post bilateral above knee amputations August 16, 2016. History of hypertension, diabetes, Johnson vertebral artery occlusion. EXAM: MRI HEAD WITHOUT CONTRAST TECHNIQUE: Multiplanar, multiecho pulse sequences of the Johnson and surrounding structures were obtained without intravenous contrast. COMPARISON:  CT HEAD August 17, 2016 and MRI of the head March 28, 2016 and MRI of the head March 30, 2016 FINDINGS: Johnson: No reduced diffusion to suggest acute ischemia. Scattered chronic micro hemorrhages. Moderate ventriculomegaly on the basis of global parenchymal Johnson volume loss. Multiple old pontine lacunar infarcts. Tiny old cerebellar infarcts. Old bilateral basal ganglia and bilateral thalamus lacunar infarcts. Confluent supratentorial and patchy pontine white matter FLAIR T2 hyperintensities. No midline shift, mass effect or masses. No abnormal extra-axial fluid collections . VASCULAR: Attenuated Johnson vertebral artery flow void, better characterized on prior MRI. SKULL AND UPPER CERVICAL SPINE: No abnormal sellar expansion. No suspicious calvarial bone marrow signal. Craniocervical junction maintained. SINUSES/ORBITS: Mild paranasal sinus mucosal thickening. Mastoid air cells are well aerated. The included ocular globes and orbital contents are non-suspicious. Status post bilateral ocular lens implants. OTHER: None. IMPRESSION: No acute intracranial process. 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 Moderate to severe chronic small vessel ischemic disease. Moderate parenchymal Johnson volume loss, advanced for age. Electronically Signed   By: Elon Alas M.D.   On: 08/19/2016 01:52   [name] Wo Contrast  Result Date:  08/13/2016 CLINICAL DATA:  NONHEALING DIABETIC SOFT TISSUE ULCER IN EXAM: MRI OF THE Johnson HINDFOOT WITHOUT CONTRAST TECHNIQUE: Multiplanar, multisequence James imaging of the ankle was performed. No intravenous contrast was administered. COMPARISON:  RADIOGRAPHS DATED 08/12/2016 FINDINGS: TENDONS Peroneal: Intact peroneus longus and peroneus brevis tendons. Posteromedial: Intact tibialis posterior, flexor hallucis longus and flexor digitorum longus tendons. Anterior: Intact tibialis anterior, extensor hallucis longus and extensor digitorum longus tendons. Achilles: Intact. Plantar Fascia: Normal. LIGAMENTS Lateral: Intact. Medial: Intact. CARTILAGE Ankle Joint: Small nonspecific ankle joint effusion. No chondral defect. Subtalar Joints/Sinus Tarsi: No joint effusion or chondral defect. Bones: Abnormal edema and erosion of the superficial cortex of the lateral aspect of the lateral malleolus consistent with osteomyelitis. Overlying soft tissue ulceration. There is periosteal reaction extending 4 cm proximal to the tip of the lateral malleolus. Other: Prominent soft tissue ulceration over the lateral malleolus. Suggestion of soft tissue ulceration posterior laterally adjacent to the posterior calcaneus. No underlying osteomyelitis at that site. IMPRESSION: Osteomyelitis of the lateral malleolus of the distal fibula. Electronically Signed   By: Lorriane Shire M.D.   On: 08/13/2016 14:35   [name] Wo Contrast  Result Date: 08/13/2016 CLINICAL DATA:  Diabetic patient with a nonhealing ulcer on the Johnson heel. EXAM: MRI OF THE Johnson FOOT WITHOUT CONTRAST TECHNIQUE: Multiplanar, multisequence James imaging of the ankle was performed. No intravenous contrast was administered. COMPARISON:  Plain films Johnson ankle 08/12/2016. FINDINGS: TENDONS Peroneal: Intact. Posteromedial: Intact. Anterior: Intact. Achilles: Intrasubstance increased T2 signal is seen in the distal most fibers of the Achilles tendon. There is a small  amount fluid in the retrocalcaneal bursa. The tendon is intact. Plantar Fascia: Intact. LIGAMENTS Lateral: Intact. Medial: Intact. CARTILAGE Ankle Joint: Negative. Subtalar Joints/Sinus Tarsi: Negative. Bones: There is intense marrow edema in the posterior 4.5 cm of the calcaneus. Multiple hypointensities within the substance of the inferior calcaneus are likely due to locules of gas. Much milder degree of edema is seen in the more distal calcaneus extending to the calcaneocuboid  joint. Edema is seen about the articulations of the medial and middle cuneiforms and navicular and medial cuneiform. Other: There is a large skin ulceration on the plantar surface of the heel. Air is seen within the ulceration. No focal fluid collection is seen. Imaged intrinsic musculature the foot demonstrates increased T2 signal without focal fluid collection. There is subcutaneous edema about the ankle and foot. IMPRESSION: Large heel ulceration with signal change and gas in at least the posterior 4.5 cm of the calcaneus consistent with emphysematous osteomyelitis. Milder degree in the more distal calcaneus toward the calcaneocuboid joint could be due to osteomyelitis but has an appearance most suggestive of reactive change. Intense subcutaneous edema about the ankle and visualized foot consistent with dependent change and/or cellulitis. Mild intrasubstance of the distal most Achilles tendon consistent with tendinosis, possibly infectious given the patient's heel ulceration. Associated small volume of fluid in the retrocalcaneal bursa could be septic or aseptic. Edema within all imaged intrinsic musculature the foot could be due to denervation atrophy and/or inflammatory change. No intramuscular abscess is seen. Degenerative or neuropathic change about the midfoot. Electronically Signed   By: Inge Rise M.D.   On: 08/13/2016 15:09   Dg Chest Port 1 View  Result Date: 08/18/2016 CLINICAL DATA:  Fever. Hx PNA, MI, HTN, diabetes,  CHF, nonsmoker. EXAM: PORTABLE CHEST 1 VIEW COMPARISON:  08/12/2016 FINDINGS: Cardiac silhouette is normal in size. No mediastinal or hilar masses. Lung volumes are low, particularly on the Johnson with there is elevation of Johnson hemidiaphragm. Lungs are clear. No convincing pleural effusion. No pneumothorax. Skeletal structures are grossly intact. IMPRESSION: No active disease. Electronically Signed   By: Lajean Manes M.D.   On: 08/18/2016 14:59    Microbiology: Recent Results (from the past 240 hour(s))  Culture, blood (Routine X 2) w Reflex to ID Panel     Status: None   Collection Time: 08/18/16 12:49 PM  Result Value Ref Range Status   Specimen Description BLOOD Johnson HAND  Final   Special Requests IN PEDIATRIC BOTTLE Blood Culture adequate volume  Final   Culture NO GROWTH 5 DAYS  Final   Report Status 08/23/2016 FINAL  Final  Culture, blood (Routine X 2) w Reflex to ID Panel     Status: Abnormal   Collection Time: 08/18/16  1:01 PM  Result Value Ref Range Status   Specimen Description BLOOD Johnson HAND  Final   Special Requests IN PEDIATRIC BOTTLE Blood Culture adequate volume  Final   Culture  Setup Time   Final    GRAM POSITIVE COCCI IN CLUSTERS AEROBIC BOTTLE ONLY CRITICAL RESULT CALLED TO, READ BACK BY AND VERIFIED WITH: A JONES,PHARMD AT 1229 08/19/16 BY L BENFIELD    Culture (A)  Final    STAPHYLOCOCCUS SPECIES (COAGULASE NEGATIVE) THE SIGNIFICANCE OF ISOLATING THIS ORGANISM FROM A SINGLE SET OF BLOOD CULTURES WHEN MULTIPLE SETS ARE DRAWN IS UNCERTAIN. PLEASE NOTIFY THE MICROBIOLOGY DEPARTMENT WITHIN ONE WEEK IF SPECIATION AND SENSITIVITIES ARE REQUIRED.    Report Status 08/21/2016 FINAL  Final  Blood Culture ID Panel (Reflexed)     Status: Abnormal   Collection Time: 08/18/16  1:01 PM  Result Value Ref Range Status   Enterococcus species NOT DETECTED NOT DETECTED Final   Listeria monocytogenes NOT DETECTED NOT DETECTED Final   Staphylococcus species DETECTED (A) NOT  DETECTED Final    Comment: Methicillin (oxacillin) resistant coagulase negative staphylococcus. Possible blood culture contaminant (unless isolated from more than one blood culture draw or  clinical case suggests pathogenicity). No antibiotic treatment is indicated for blood  culture contaminants. CRITICAL RESULT CALLED TO, READ BACK BY AND VERIFIED WITH: A JONES,PHARMD AT 1229 08/19/16 BY L BENFIELD    Staphylococcus aureus NOT DETECTED NOT DETECTED Final   Methicillin resistance DETECTED (A) NOT DETECTED Final    Comment: CRITICAL RESULT CALLED TO, READ BACK BY AND VERIFIED WITH: A JONES,PHARMD AT 1229 08/19/16 BY L BENFIELD    Streptococcus species NOT DETECTED NOT DETECTED Final   Streptococcus agalactiae NOT DETECTED NOT DETECTED Final   Streptococcus pneumoniae NOT DETECTED NOT DETECTED Final   Streptococcus pyogenes NOT DETECTED NOT DETECTED Final   Acinetobacter baumannii NOT DETECTED NOT DETECTED Final   Enterobacteriaceae species NOT DETECTED NOT DETECTED Final   Enterobacter cloacae complex NOT DETECTED NOT DETECTED Final   Escherichia coli NOT DETECTED NOT DETECTED Final   Klebsiella oxytoca NOT DETECTED NOT DETECTED Final   Klebsiella pneumoniae NOT DETECTED NOT DETECTED Final   Proteus species NOT DETECTED NOT DETECTED Final   Serratia marcescens NOT DETECTED NOT DETECTED Final   Haemophilus influenzae NOT DETECTED NOT DETECTED Final   Neisseria meningitidis NOT DETECTED NOT DETECTED Final   Pseudomonas aeruginosa NOT DETECTED NOT DETECTED Final   Candida albicans NOT DETECTED NOT DETECTED Final   Candida glabrata NOT DETECTED NOT DETECTED Final   Candida krusei NOT DETECTED NOT DETECTED Final   Candida parapsilosis NOT DETECTED NOT DETECTED Final   Candida tropicalis NOT DETECTED NOT DETECTED Final     Labs: Basic Metabolic Panel:  Recent Labs Lab 08/19/16 1745 08/23/16 0243 08/24/16 0209 08/25/16 0140 08/26/16 0206  NA 137 142 140 143 141  K 3.7 3.5 3.8 3.5  4.0  CL 110 108 106 109 108  CO2 _0 GLUCOSE 224* 114* 195* 133* 129*  BUN 9 7 5* 6 7  CREATININE 1.16 1.10 1.20 1.22 1.20  CALCIUM 7.4* 7.9* 7.7* 7.9* 8.1*  PHOS  --   --  3.6  --   --    Liver Function Tests:  Recent Labs Lab 08/24/16 0209  ALBUMIN 1.8*   No results for input(s): LIPASE, AMYLASE in the last 168 hours. No results for input(s): AMMONIA in the last 168 hours. CBC:  Recent Labs Lab 08/20/16 0548 08/21/16 0246 08/22/16 0551 08/23/16 0243 08/24/16 0209  WBC 10.7* 9.8 10.2 9.0 10.5  HGB 8.4* 8.5* 8.6* 8.3* 8.5*  HCT 26.8* 27.7* 28.1* 26.9* 27.3*  MCV 87.6 87.4 89.5 87.9 87.5  PLT 408* 423* 436* 451* 477*   Cardiac Enzymes: No results for input(s): CKTOTAL, CKMB, CKMBINDEX, TROPONINI in the last 168 hours. BNP: BNP (last 3 results)  Recent Labs  05/10/16 1027  BNP 53.8    ProBNP (last 3 results) No results for input(s): PROBNP in the last 8760 hours.  CBG:  Recent Labs Lab 08/25/16 0606 08/25/16 1125 08/25/16 1638 08/25/16 2020 08/26/16 0606  GLUCAP 99 96 111* 133* 90       Signed:  , MD.  [facility] 08/26/2016, 11:52 AM

## 2016-08-27 DIAGNOSIS — Z4781 Encounter for orthopedic aftercare following surgical amputation: Secondary | ICD-10-CM | POA: Diagnosis not present

## 2016-08-27 DIAGNOSIS — E1165 Type 2 diabetes mellitus with hyperglycemia: Secondary | ICD-10-CM | POA: Diagnosis not present

## 2016-08-27 DIAGNOSIS — I69351 Hemiplegia and hemiparesis following cerebral infarction affecting right dominant side: Secondary | ICD-10-CM | POA: Diagnosis not present

## 2016-08-27 DIAGNOSIS — B952 Enterococcus as the cause of diseases classified elsewhere: Secondary | ICD-10-CM | POA: Diagnosis not present

## 2016-08-27 DIAGNOSIS — E1151 Type 2 diabetes mellitus with diabetic peripheral angiopathy without gangrene: Secondary | ICD-10-CM | POA: Diagnosis not present

## 2016-08-27 DIAGNOSIS — R7881 Bacteremia: Secondary | ICD-10-CM | POA: Diagnosis not present

## 2016-08-27 LAB — GLUCOSE, CAPILLARY
GLUCOSE-CAPILLARY: 87 mg/dL (ref 65–99)
GLUCOSE-CAPILLARY: 109 mg/dL — AB (ref 65–99)

## 2016-08-27 LAB — C-REACTIVE PROTEIN: CRP: <0.8 mg/dL (ref <1.0)

## 2016-08-27 LAB — SEDIMENTATION RATE: Sed Rate: 121 mm/hr — ABNORMAL HIGH (ref 0–16)

## 2016-08-27 MED ORDER — HEPARIN SOD (PORK) LOCK FLUSH 100 UNIT/ML IV SOLN
250.0000 [IU] | INTRAVENOUS | Status: DC | PRN
  Administered 2016-08-27: 250 [IU]
  Filled 2016-08-27: qty 3

## 2016-08-27 MED ORDER — HEPARIN SOD (PORK) LOCK FLUSH 100 UNIT/ML IV SOLN
250.0000 [IU] | Freq: Every day | INTRAVENOUS | Status: DC
  Filled 2016-08-27: qty 2.5

## 2016-08-27 NOTE — Progress Notes (Signed)
Patient does not want his stump touched or looked at.

## 2016-08-27 NOTE — Progress Notes (Signed)
50- have spoken with pt's daughter [name] confirmed that [facility] is there now for DME delivery- have called [facility] to set up transport home- estimated time for transport per [facility]- 1 hour- bedside RN aware. Have also alerted Pam with [facility]- they are set to make home visit for HHRN-iv abx- around 4 pm.

## 2016-08-27 NOTE — Progress Notes (Signed)
PROGRESS NOTE    [name]  IOE:[id number] DOB: 1956/01/03 DOA: 08/12/2016 PCP: No primary care provider on file.    Brief Narrative:  61 year old with h/o DM, hypertension, chronic diastolic CHF, DVT, CAD, PVD, CVA with residual dysarthria coming in from SNF for persistent fevers. He was found to have bilateral heel ulcers , which have worsened over the last few weeks. This admission, ABI obtained showed severe / critical disease, vascular surgery consulted and transferred to [facility] for further work up/ intervention. Patient underwent bilateral AKA on 6/21. On the early hours of 6/22, pt had an episode of altered mental status possibly form oversedation from pain meds. CT head was negative for acute stroke. As continued to be lethargic , MRI of thebrain was also done and it was negative for acute intracranial pathology, he was found to have old strokes and small vessel disease.     Assessment & Plan:   Principal Problem:   Sepsis ([place]) Active Problems:   Enterococcal bacteremia   Bacteremia due to Escherichia coli   Osteomyelitis of right foot (HCC)   Proteus infection   Osteomyelitis of left foot (HCC)   Diabetes type 2, uncontrolled (HCC)   Hypertension   Chronic diastolic CHF (congestive heart failure) (HCC)   Peripheral neuropathy   Personal history of DVT (deep vein thrombosis)   History of DVT (deep vein thrombosis)   Coronary artery disease involving native coronary artery of native heart without angina pectoris   Cognitive and behavioral changes   Urinary retention   Dysphagia due to recent stroke   AKI (acute kidney injury) ([place])   Pressure injury of skin   History of CVA (cerebrovascular accident)   Goals of care, counseling/discussion   Palliative care by specialist   Status post bilateral above knee amputation ([place])   Acute on chronic alteration in mental status   Fever  #1 sepsis secondary to osteomyelitis from bilateral heel ulcers, polymicrobial  bacteremia Patient with clinical improvement. Patient currently afebrile. Leukocytosis improved. CRP trending down. Repeat blood cultures from 08/18/2016 with 1 out of 2 coagulase negative staph likely a contaminant. Initial blood cultures from 08/12/2016 polymicrobial with Escherichia coli, Enterococcus faecalis, Proteus species. Patient initially on IV vancomycin, IV Flagyl and IV Rocephin which was subsequently discontinued per ID recommendations. Patient currently on IV Unasyn per ID recommendations and will need IV antibiotics for total of 14 days through 08/27/2016. Follow.  #2 PAD Patient status post bilateral AKA on 08/16/2016. Statin has been resumed. On Xarelto. Outpatient follow-up with vascular surgery.  #3 coronary artery disease Stable. Continue statin and Coreg, BiDil.  #4 history of DVT Stable. Continue Xarelto.  #5 acute kidney injury Improved.   #6 history of chronic diastolic heart failure Currently euvolemic. Lasix on hold. On discharge will resume Lasix at home regimen of when necessary. Follow.  #7 history of dysphasia From prior CVA. Patient seen by speech therapy was recommended dysphagia 2 diet.   #8 anemia of chronic disease Stable. Hemoglobin dropped to 7.2 patient status post 1 unit packed red blood cells. Hemoglobin currently stable at 8.5.  #9 diabetes mellitus type 2 Hemoglobin A1c 8.3. Patient noted to be hypoglycemic with poor oral intake. Patient with improved oral intake. CBGs 87-110. Increased Lantus to 40 units daily. Sliding scale insulin.  #10 history of CVA Risk factor modification.  #11 acute encephalopathy Initially felt to be secondary to oversedation from pain meds. CT head was negative for any acute intracranial abnormality. MRI of the  head was negative for any acute abnormalities. IV pain medication was discontinued. Clinical improvement and likely at baseline.  #12 hypertension Continue current regimen of Norvasc, Coreg, clonidine,  BiDil.  #13 urinary retention Continue Foley catheter and Flomax. Outpatient follow-up with urology for voiding trial and further evaluation and management.   DVT prophylaxis: Xarelto. Code Status: Full Family Communication: Updated patient. No family at bedside. Disposition Plan: d/c home.   Consultants:   Vascular surgery Dr Donzetta Matters.   Infectious disease Dr Megan Salon  Wound care  Palliative care consult.    Procedures:   ABIs  Bilateral AKA 08/16/2016--per Dr. Trula Slade  PICC line 08/25/2016  Antimicrobials:  IV Rocephin 08/13/2016>>>> 08/17/2016  IV Flagyl 08/12/2016 >>>>>> 08/17/2016   IV vancomycin 08/12/2016>>>>> 08/17/2016  IV Unasyn 08/17/2016>>>>>>>08/27/2016   Subjective: Patient in bed. Alert. No shortness of breath. No chest pain.   Objective: Vitals:   08/26/16 1233 08/26/16 2053 08/27/16 0300 08/27/16 0931  BP: (!) 171/62 (!) 148/58 (!) 174/61 (!) 196/64  Pulse: 64 68 66 68  Resp: 20 19 19    Temp: 98.3 F (36.8 C) 99.1 F (37.3 C) 98.7 F (37.1 C)   TempSrc: Oral Oral Oral   SpO2: 100% 98% 96%   Weight:   85.8 kg (189 lb 3.2 oz)   Height:        Intake/Output Summary (Last 24 hours) at 08/27/16 1013 Last data filed at 08/27/16 1000  Gross per 24 hour  Intake               20 ml  Output             2560 ml  Net            -2540 ml   Filed Weights   08/25/16 0515 08/26/16 0459 08/27/16 0300  Weight: 88.3 kg (194 lb 11.2 oz) 88.1 kg (194 lb 3.2 oz) 85.8 kg (189 lb 3.2 oz)    Examination:  General exam: Appears calm and comfortable  Respiratory system: Clear to auscultation Anterior lung fields. Respiratory effort normal. Cardiovascular system: S1 & S2 heard, RRR. No JVD, murmurs, rubs, gallops or clicks. No pedal edema. Gastrointestinal system: Abdomen is nondistended, soft and nontender. No organomegaly or masses felt. Normal bowel sounds heard. Central nervous system: Alert and oriented. No focal neurological deficits. Extremities:  Status post bilateral AKA. Skin: No rashes, lesions or ulcers Psychiatry: Judgement and insight appear fair. Mood & affect appropriate.     Data Reviewed: I have personally reviewed following labs and imaging studies  CBC:  Recent Labs Lab 08/21/16 0246 08/22/16 0551 08/23/16 0243 08/24/16 0209  WBC 9.8 10.2 9.0 10.5  HGB 8.5* 8.6* 8.3* 8.5*  HCT 27.7* 28.1* 26.9* 27.3*  MCV 87.4 89.5 87.9 87.5  PLT 423* 436* 451* 209*   Basic Metabolic Panel:  Recent Labs Lab 08/23/16 0243 08/24/16 0209 08/25/16 0140 08/26/16 0206  NA 142 140 143 141  K 3.5 3.8 3.5 4.0  CL 108 106 109 108  CO2 26 27 26 27   GLUCOSE 114* 195* 133* 129*  BUN 7 5* 6 7  CREATININE 1.10 1.20 1.22 1.20  CALCIUM 7.9* 7.7* 7.9* 8.1*  PHOS  --  3.6  --   --    GFR: Estimated Creatinine Clearance: 71 mL/min (by C-G formula based on SCr of 1.2 mg/dL). Liver Function Tests:  Recent Labs Lab 08/24/16 0209  ALBUMIN 1.8*   No results for input(s): LIPASE, AMYLASE in the last 168 hours. No results for  input(s): AMMONIA in the last 168 hours. Coagulation Profile: No results for input(s): INR, PROTIME in the last 168 hours. Cardiac Enzymes: No results for input(s): CKTOTAL, CKMB, CKMBINDEX, TROPONINI in the last 168 hours. BNP (last 3 results) No results for input(s): PROBNP in the last 8760 hours. HbA1C: No results for input(s): HGBA1C in the last 72 hours. CBG:  Recent Labs Lab 08/25/16 2020 08/26/16 0606 08/26/16 1211 08/26/16 2051 08/27/16 0634  GLUCAP 133* 90 88 110* 87   Lipid Profile: No results for input(s): CHOL, HDL, LDLCALC, TRIG, CHOLHDL, LDLDIRECT in the last 72 hours. Thyroid Function Tests: No results for input(s): TSH, T4TOTAL, FREET4, T3FREE, THYROIDAB in the last 72 hours. Anemia Panel: No results for input(s): VITAMINB12, FOLATE, FERRITIN, TIBC, IRON, RETICCTPCT in the last 72 hours. Sepsis Labs:  Recent Labs Lab 08/22/16 0551  PROCALCITON 0.83    Recent Results  (from the past 240 hour(s))  Culture, blood (Routine X 2) w Reflex to ID Panel     Status: None   Collection Time: 08/18/16 12:49 PM  Result Value Ref Range Status   Specimen Description BLOOD LEFT HAND  Final   Special Requests IN PEDIATRIC BOTTLE Blood Culture adequate volume  Final   Culture NO GROWTH 5 DAYS  Final   Report Status 08/23/2016 FINAL  Final  Culture, blood (Routine X 2) w Reflex to ID Panel     Status: Abnormal   Collection Time: 08/18/16  1:01 PM  Result Value Ref Range Status   Specimen Description BLOOD RIGHT HAND  Final   Special Requests IN PEDIATRIC BOTTLE Blood Culture adequate volume  Final   Culture  Setup Time   Final    GRAM POSITIVE COCCI IN CLUSTERS AEROBIC BOTTLE ONLY CRITICAL RESULT CALLED TO, READ BACK BY AND VERIFIED WITH: A JONES,PHARMD AT 1229 08/19/16 BY L BENFIELD    Culture (A)  Final    STAPHYLOCOCCUS SPECIES (COAGULASE NEGATIVE) THE SIGNIFICANCE OF ISOLATING THIS ORGANISM FROM A SINGLE SET OF BLOOD CULTURES WHEN MULTIPLE SETS ARE DRAWN IS UNCERTAIN. PLEASE NOTIFY THE MICROBIOLOGY DEPARTMENT WITHIN ONE WEEK IF SPECIATION AND SENSITIVITIES ARE REQUIRED.    Report Status 08/21/2016 FINAL  Final  Blood Culture ID Panel (Reflexed)     Status: Abnormal   Collection Time: 08/18/16  1:01 PM  Result Value Ref Range Status   Enterococcus species NOT DETECTED NOT DETECTED Final   Listeria monocytogenes NOT DETECTED NOT DETECTED Final   Staphylococcus species DETECTED (A) NOT DETECTED Final    Comment: Methicillin (oxacillin) resistant coagulase negative staphylococcus. Possible blood culture contaminant (unless isolated from more than one blood culture draw or clinical case suggests pathogenicity). No antibiotic treatment is indicated for blood  culture contaminants. CRITICAL RESULT CALLED TO, READ BACK BY AND VERIFIED WITH: A JONES,PHARMD AT 1229 08/19/16 BY L BENFIELD    Staphylococcus aureus NOT DETECTED NOT DETECTED Final   Methicillin resistance  DETECTED (A) NOT DETECTED Final    Comment: CRITICAL RESULT CALLED TO, READ BACK BY AND VERIFIED WITH: A JONES,PHARMD AT 1229 08/19/16 BY L BENFIELD    Streptococcus species NOT DETECTED NOT DETECTED Final   Streptococcus agalactiae NOT DETECTED NOT DETECTED Final   Streptococcus pneumoniae NOT DETECTED NOT DETECTED Final   Streptococcus pyogenes NOT DETECTED NOT DETECTED Final   Acinetobacter baumannii NOT DETECTED NOT DETECTED Final   Enterobacteriaceae species NOT DETECTED NOT DETECTED Final   Enterobacter cloacae complex NOT DETECTED NOT DETECTED Final   Escherichia coli NOT DETECTED NOT DETECTED Final  Klebsiella oxytoca NOT DETECTED NOT DETECTED Final   Klebsiella pneumoniae NOT DETECTED NOT DETECTED Final   Proteus species NOT DETECTED NOT DETECTED Final   Serratia marcescens NOT DETECTED NOT DETECTED Final   Haemophilus influenzae NOT DETECTED NOT DETECTED Final   Neisseria meningitidis NOT DETECTED NOT DETECTED Final   Pseudomonas aeruginosa NOT DETECTED NOT DETECTED Final   Candida albicans NOT DETECTED NOT DETECTED Final   Candida glabrata NOT DETECTED NOT DETECTED Final   Candida krusei NOT DETECTED NOT DETECTED Final   Candida parapsilosis NOT DETECTED NOT DETECTED Final   Candida tropicalis NOT DETECTED NOT DETECTED Final         Radiology Studies: No results found.      Scheduled Meds: . amLODipine  10 mg Oral Daily  . atorvastatin  40 mg Oral q1800  . bethanechol  10 mg Oral TID  . carvedilol  25 mg Oral BID WC  . cloNIDine  0.3 mg Oral TID  . docusate sodium  100 mg Oral Daily  . feeding supplement (GLUCERNA SHAKE)  237 mL Oral QID  . feeding supplement (PRO-STAT SUGAR FREE 64)  30 mL Oral BID  . FLUoxetine  10 mg Oral Daily  . insulin aspart  0-15 Units Subcutaneous TID WC  . insulin aspart  0-5 Units Subcutaneous QHS  . insulin glargine  40 Units Subcutaneous QHS  . isosorbide-hydrALAZINE  2 tablet Oral TID  . mirtazapine  7.5 mg Oral QHS  .  pantoprazole  40 mg Oral Daily  . polyethylene glycol  17 g Oral Daily  . QUEtiapine  50 mg Oral Daily  . rivaroxaban  20 mg Oral Daily  . senna-docusate  1 tablet Oral Daily  . tamsulosin  0.4 mg Oral QPC supper  . zinc sulfate  220 mg Oral Daily   Continuous Infusions: . ampicillin-sulbactam (UNASYN) IV Stopped (08/27/16 0555)  . magnesium sulfate 1 - 4 g bolus IVPB       LOS: 15 days    Time spent: 35 minutes    THOMPSON,DANIEL, MD [facility] Pager [phone]  If 7PM-7AM, please contact night-coverage [email or web address] Password [email or web address] 08/27/2016, 10:13 AM

## 2016-08-27 NOTE — Care Management Note (Signed)
Case Management Note Marvetta Gibbons RN, BSN Unit 2W-Case Manager [phone]  Patient Details  Name: [name] MRN: [id number] Date of Birth: April 16, 1955  Subjective/Objective:  Pt admitted fever,  Osteomyelitis of foot, s/p bil AKA on 6/21                 Action/Plan: PTA pt was at [facility]- per CSW pt/family have out standing debt to [facility] and SNF will not take pt back until they make a payment- CSW working with family on plan- however- family may have to take pt home with [facility] services- they have been provided a private duty list per CSW- awaiting to hear from family   Expected Discharge Date:  08/27/16               Expected Discharge Plan:  [place]  In-House Referral:  Clinical Social Work  Discharge planning Services  CM Consult, Medication Assistance  Post Acute Care Choice:  Home Health, Durable Medical Equipment Choice offered to:  Patient, Adult Children  DME Arranged:  Hospital bed, Wheelchair manual, Other see comment DME Agency:  [place]:  RN, PT, OT, Nurse's Aide, Social Work, IV Antibiotics HH Agency:  [place]  Status of Service:  Completed, signed off  If discussed at [employer] of Stay Meetings, dates discussed:  6/28  Discharge Disposition: home with home health  Additional Comments:   08/27/16- 1000- Aspasia Rude RN, CM- pt did not d/c home over weekend- not sure what happened- will f/u with [facility] for question of why DME was not delivered. Have spoken with pt's daughter [name] this AM- she is prepared for pt to return home- states that IV abx have been delivered however she is still awaiting delivery of bed and rest of DME. Have spoken to multiple people at [facility] and have been assured that DME will be in home by 2 pm this afternoon. Per Pam [facility] is ready to make home visit around 4pm for IV abx teaching with daughter- have confirmed daughter is ready for d/c today. Plan will be to d/c pt  home via [facility] once CM confirms that [facility] is in the home with DME.  1410- call made to daughter and confirmed [facility] is there now with DME- [facility] called to set up transportation home- est. Time 1 hr for pick up. Confirmed with Pam at [facility] they will see pt in the home around 4 pm today.   08/24/16- 1330- Marvetta Gibbons RN, CM- call made to pt's daughter [name] along with CSW-Ash[place]- discussion was had regarding d/c plans- per [name] she is prepared at this time to take her father home with [facility] services- she states she needs today to arrange furniture to make room for hospital bed- discussed needed DME- hospital bed, hoyer lift, wheelchair, - also spoke on medicaid application and where they are at in process- per [name] she has left multiple messages with caseworker. Choice offered for [facility] services will email copy of [facility] list for [facility] along with Private Duty list- went over the difference between the two. Also discussed IV abx need until July 2- [name] voices that she feels she will be able to do this at home. Informed [name] that pt would need a PCP for [facility] services and that Dr. Alyson Ingles has dismissed her father from his practice- she will f/u on this next week to find out why?- in the meantime [name] gave verbal permission to use  any MD that would assist in [facility] services. Confirmed Address- [place]- confirmed plan to transport by ambulance to daughter's home once DME has been delivered- daughter to let this CM know choice of agency later this afternoon.  1545- received call from [place]- pt's other daughter for update on conference call that she missed- updates provided and also emailed HH/private duty list per request to her also.  1600- call made to [facility] Reinaldo Helt regarding [place] choice- per [name] they will use [facility] for services- referral called to karen with [place] for [facility]- RN/PT/OT/aide/sw- along with DME needs- also spoke with Pam for IV abx needs- PICC has been ordered- will not be placed until  tomorrow per RN.  1700- spoke with Dr A - regarding d/c plans and need for [place] (PCP and high risk)- Dr. Loni Muse has made the pt [place] and agreed to be overseeing MD to sign orders.    08/22/16- 1655- Krishay Faro RN, CM- Received return call from pt's daughter Myrlene Broker- per conversation pt's other daughter [name] is POA- and Velna Hatchet states that they do not have a "good relationship"- she reports that she works 3rd shift and sometimes a day job- recently moved here about a month ago and has an upstairs apartment that she does not feel like she has the space to accommodate her dad or offer a safe discharge to her place. She is concerned about finding a safe place- not sure about her sister's place as she has never been there. She was unaware of the financial situation and debt at the SNF- and thought her dad would be returning to [facility] for further rehab. Discussed that pt was medically stable for discharge - Velna Hatchet states that there are no other family members to assist- discussed [place] options/private pay assistance/possible DME needs- she is not sure they can private pay and is not sure where dad would go- as he no longer has his own place following stroke they got rid of his place. Requested that she please let her sister know that CM and CSW have been trying to reach her and to please call us so that we can work on a safe discharge plan as we have left messages for [name] with no return phone call. Velna Hatchet states she will send sister a message. CM and CSW will continue to work with family to find a safe discharge for pt. Spoke with Pamala Hurry with [facility]- pt currently not appropriate for [facility].   Dawayne Patricia, RN 08/27/2016, 2:36 PM

## 2016-08-27 NOTE — Care Management Important Message (Signed)
Important Message  Patient Details  Name: [name] MRN: [id number] Date of Birth: [birth year]   Medicare Important Message Given:  Yes    Nathen May 08/27/2016, 10:10 AM

## 2016-08-27 NOTE — Progress Notes (Signed)
Discharge instructions given. Pt verbalized understanding and all questions were answered.  

## 2016-08-27 NOTE — Progress Notes (Signed)
Palliative Medicine RN Note: Discussed pt in team rounds. Palliative concerns have been addressed. Pt is now waiting on d/c. PMT will sign off now. Should the situation change, please reconsult [place] or call our office.  Marjie Skiff Prapti Grussing, RN, BSN, [facility] 08/27/2016 11:12 AM Cell [phone] 8:00-4:00 Monday-Friday Office [phone]

## 2016-08-28 DIAGNOSIS — B952 Enterococcus as the cause of diseases classified elsewhere: Secondary | ICD-10-CM | POA: Diagnosis not present

## 2016-08-28 DIAGNOSIS — E1165 Type 2 diabetes mellitus with hyperglycemia: Secondary | ICD-10-CM | POA: Diagnosis not present

## 2016-08-28 DIAGNOSIS — R7881 Bacteremia: Secondary | ICD-10-CM | POA: Diagnosis not present

## 2016-08-28 DIAGNOSIS — I69351 Hemiplegia and hemiparesis following cerebral infarction affecting right dominant side: Secondary | ICD-10-CM | POA: Diagnosis not present

## 2016-08-28 DIAGNOSIS — Z4781 Encounter for orthopedic aftercare following surgical amputation: Secondary | ICD-10-CM | POA: Diagnosis not present

## 2016-08-28 DIAGNOSIS — E1151 Type 2 diabetes mellitus with diabetic peripheral angiopathy without gangrene: Secondary | ICD-10-CM | POA: Diagnosis not present

## 2016-08-28 MED ORDER — AMLODIPINE BESYLATE 10 MG PO TABS: 10.0000 mg | ORAL_TABLET | Freq: Every day | ORAL | 0 refills | Status: DC

## 2016-08-28 MED ORDER — ISOSORB DINITRATE-HYDRALAZINE 20-37.5 MG PO TABS: 2.0000 | ORAL_TABLET | Freq: Three times a day (TID) | ORAL | 0 refills | Status: DC

## 2016-08-28 MED ORDER — INSULIN ASPART 100 UNIT/ML FLEXPEN: 3.0000 [IU] | PEN_INJECTOR | Freq: Three times a day (TID) | SUBCUTANEOUS | 0 refills | Status: DC

## 2016-08-28 MED ORDER — BETHANECHOL CHLORIDE 10 MG PO TABS: 10.0000 mg | ORAL_TABLET | Freq: Three times a day (TID) | ORAL | 0 refills | Status: DC

## 2016-08-28 MED ORDER — HYDROCORTISONE 2.5 % RE CREA: TOPICAL_CREAM | Freq: Two times a day (BID) | RECTAL | 0 refills | Status: DC | PRN

## 2016-08-28 MED ORDER — TAMSULOSIN HCL 0.4 MG PO CAPS: 0.4000 mg | ORAL_CAPSULE | Freq: Every day | ORAL | 0 refills | Status: DC

## 2016-08-28 MED ORDER — INSULIN GLARGINE 300 UNIT/ML ~~LOC~~ SOPN: 40.0000 [IU] | PEN_INJECTOR | Freq: Every day | SUBCUTANEOUS | 0 refills | Status: DC

## 2016-08-28 MED ORDER — ALBUTEROL SULFATE (2.5 MG/3ML) 0.083% IN NEBU: 2.5000 mg | INHALATION_SOLUTION | RESPIRATORY_TRACT | 0 refills | Status: DC | PRN

## 2016-08-28 MED ORDER — FLUOXETINE HCL 10 MG PO CAPS: 10.0000 mg | ORAL_CAPSULE | Freq: Every day | ORAL | 0 refills | Status: DC

## 2016-08-28 MED ORDER — PANTOPRAZOLE SODIUM 40 MG PO TBEC: 40.0000 mg | DELAYED_RELEASE_TABLET | Freq: Every day | ORAL | 0 refills | Status: DC

## 2016-08-28 MED ORDER — CLONIDINE HCL 0.3 MG PO TABS: 0.3000 mg | ORAL_TABLET | Freq: Three times a day (TID) | ORAL | 0 refills | Status: DC

## 2016-08-28 MED ORDER — SENNOSIDES-DOCUSATE SODIUM 8.6-50 MG PO TABS: 1.0000 | ORAL_TABLET | Freq: Every day | ORAL | 0 refills | Status: DC

## 2016-08-28 MED ORDER — FUROSEMIDE 40 MG PO TABS: 40.0000 mg | ORAL_TABLET | Freq: Every day | ORAL | 0 refills | Status: DC | PRN

## 2016-08-28 MED ORDER — DOCUSATE SODIUM 100 MG PO CAPS: 100.0000 mg | ORAL_CAPSULE | Freq: Every day | ORAL | 0 refills | Status: DC

## 2016-08-28 MED ORDER — CARVEDILOL 25 MG PO TABS: 25.0000 mg | ORAL_TABLET | Freq: Two times a day (BID) | ORAL | 0 refills | Status: DC

## 2016-08-28 MED ORDER — RIVAROXABAN 20 MG PO TABS: 20.0000 mg | ORAL_TABLET | Freq: Every day | ORAL | 0 refills | Status: DC

## 2016-08-28 MED ORDER — MIRTAZAPINE 7.5 MG PO TABS: 7.5000 mg | ORAL_TABLET | Freq: Every day | ORAL | 0 refills | Status: AC

## 2016-08-28 MED ORDER — QUETIAPINE FUMARATE 25 MG PO TABS: 25.0000 mg | ORAL_TABLET | Freq: Every day | ORAL | 0 refills | Status: DC

## 2016-08-28 MED ORDER — ATORVASTATIN CALCIUM 40 MG PO TABS: 40.0000 mg | ORAL_TABLET | Freq: Every day | ORAL | 0 refills | Status: DC

## 2016-08-29 DIAGNOSIS — E1151 Type 2 diabetes mellitus with diabetic peripheral angiopathy without gangrene: Secondary | ICD-10-CM | POA: Diagnosis not present

## 2016-08-29 DIAGNOSIS — E7881 Lipoid dermatoarthritis: Secondary | ICD-10-CM | POA: Diagnosis not present

## 2016-08-29 DIAGNOSIS — E1165 Type 2 diabetes mellitus with hyperglycemia: Secondary | ICD-10-CM | POA: Diagnosis not present

## 2016-08-29 DIAGNOSIS — I69351 Hemiplegia and hemiparesis following cerebral infarction affecting right dominant side: Secondary | ICD-10-CM | POA: Diagnosis not present

## 2016-08-29 DIAGNOSIS — B952 Enterococcus as the cause of diseases classified elsewhere: Secondary | ICD-10-CM | POA: Diagnosis not present

## 2016-08-29 DIAGNOSIS — R7881 Bacteremia: Secondary | ICD-10-CM | POA: Diagnosis not present

## 2016-08-29 DIAGNOSIS — Z4781 Encounter for orthopedic aftercare following surgical amputation: Secondary | ICD-10-CM | POA: Diagnosis not present

## 2016-08-30 DIAGNOSIS — R339 Retention of urine, unspecified: Secondary | ICD-10-CM | POA: Diagnosis not present

## 2016-08-30 DIAGNOSIS — R7881 Bacteremia: Secondary | ICD-10-CM | POA: Diagnosis not present

## 2016-08-30 DIAGNOSIS — Z0000 Encounter for general adult medical examination without abnormal findings: Secondary | ICD-10-CM | POA: Diagnosis not present

## 2016-08-30 DIAGNOSIS — E1165 Type 2 diabetes mellitus with hyperglycemia: Secondary | ICD-10-CM | POA: Diagnosis not present

## 2016-08-30 DIAGNOSIS — E1151 Type 2 diabetes mellitus with diabetic peripheral angiopathy without gangrene: Secondary | ICD-10-CM | POA: Diagnosis not present

## 2016-08-30 DIAGNOSIS — I5032 Chronic diastolic (congestive) heart failure: Secondary | ICD-10-CM | POA: Diagnosis not present

## 2016-08-30 DIAGNOSIS — Z4781 Encounter for orthopedic aftercare following surgical amputation: Secondary | ICD-10-CM | POA: Diagnosis not present

## 2016-08-30 DIAGNOSIS — I69351 Hemiplegia and hemiparesis following cerebral infarction affecting right dominant side: Secondary | ICD-10-CM | POA: Diagnosis not present

## 2016-08-30 DIAGNOSIS — B952 Enterococcus as the cause of diseases classified elsewhere: Secondary | ICD-10-CM | POA: Diagnosis not present

## 2016-08-30 DIAGNOSIS — M869 Osteomyelitis, unspecified: Secondary | ICD-10-CM | POA: Diagnosis not present

## 2016-08-30 DIAGNOSIS — I10 Essential (primary) hypertension: Secondary | ICD-10-CM | POA: Diagnosis not present

## 2016-08-31 DIAGNOSIS — R7881 Bacteremia: Secondary | ICD-10-CM | POA: Diagnosis not present

## 2016-08-31 DIAGNOSIS — I69351 Hemiplegia and hemiparesis following cerebral infarction affecting right dominant side: Secondary | ICD-10-CM | POA: Diagnosis not present

## 2016-08-31 DIAGNOSIS — E1165 Type 2 diabetes mellitus with hyperglycemia: Secondary | ICD-10-CM | POA: Diagnosis not present

## 2016-08-31 DIAGNOSIS — E1151 Type 2 diabetes mellitus with diabetic peripheral angiopathy without gangrene: Secondary | ICD-10-CM | POA: Diagnosis not present

## 2016-08-31 DIAGNOSIS — B952 Enterococcus as the cause of diseases classified elsewhere: Secondary | ICD-10-CM | POA: Diagnosis not present

## 2016-08-31 DIAGNOSIS — Z4781 Encounter for orthopedic aftercare following surgical amputation: Secondary | ICD-10-CM | POA: Diagnosis not present

## 2016-09-03 ENCOUNTER — Encounter: Payer: Self-pay | Admitting: Internal Medicine

## 2016-09-04 DIAGNOSIS — Z4781 Encounter for orthopedic aftercare following surgical amputation: Secondary | ICD-10-CM | POA: Diagnosis not present

## 2016-09-04 DIAGNOSIS — E1165 Type 2 diabetes mellitus with hyperglycemia: Secondary | ICD-10-CM | POA: Diagnosis not present

## 2016-09-04 DIAGNOSIS — R7881 Bacteremia: Secondary | ICD-10-CM | POA: Diagnosis not present

## 2016-09-04 DIAGNOSIS — B952 Enterococcus as the cause of diseases classified elsewhere: Secondary | ICD-10-CM | POA: Diagnosis not present

## 2016-09-04 DIAGNOSIS — I69351 Hemiplegia and hemiparesis following cerebral infarction affecting right dominant side: Secondary | ICD-10-CM | POA: Diagnosis not present

## 2016-09-04 DIAGNOSIS — E1151 Type 2 diabetes mellitus with diabetic peripheral angiopathy without gangrene: Secondary | ICD-10-CM | POA: Diagnosis not present

## 2016-09-04 NOTE — Clinical Social Work Note (Signed)
CSW received call from [facility]. They have accepted the [facility] referral. The patient's social worker is [employer] [phone]). CSW notified them that patient discharged home.  [name], [facility]

## 2016-09-05 ENCOUNTER — Encounter: Payer: Self-pay | Admitting: Surgery

## 2016-09-05 DIAGNOSIS — I69351 Hemiplegia and hemiparesis following cerebral infarction affecting right dominant side: Secondary | ICD-10-CM | POA: Diagnosis not present

## 2016-09-05 DIAGNOSIS — B952 Enterococcus as the cause of diseases classified elsewhere: Secondary | ICD-10-CM | POA: Diagnosis not present

## 2016-09-05 DIAGNOSIS — Z4781 Encounter for orthopedic aftercare following surgical amputation: Secondary | ICD-10-CM | POA: Diagnosis not present

## 2016-09-05 DIAGNOSIS — E1165 Type 2 diabetes mellitus with hyperglycemia: Secondary | ICD-10-CM | POA: Diagnosis not present

## 2016-09-05 DIAGNOSIS — R7881 Bacteremia: Secondary | ICD-10-CM | POA: Diagnosis not present

## 2016-09-05 DIAGNOSIS — E1151 Type 2 diabetes mellitus with diabetic peripheral angiopathy without gangrene: Secondary | ICD-10-CM | POA: Diagnosis not present

## 2016-09-06 DIAGNOSIS — I69351 Hemiplegia and hemiparesis following cerebral infarction affecting right dominant side: Secondary | ICD-10-CM | POA: Diagnosis not present

## 2016-09-06 DIAGNOSIS — E1151 Type 2 diabetes mellitus with diabetic peripheral angiopathy without gangrene: Secondary | ICD-10-CM | POA: Diagnosis not present

## 2016-09-06 DIAGNOSIS — E1165 Type 2 diabetes mellitus with hyperglycemia: Secondary | ICD-10-CM | POA: Diagnosis not present

## 2016-09-06 DIAGNOSIS — R7881 Bacteremia: Secondary | ICD-10-CM | POA: Diagnosis not present

## 2016-09-06 DIAGNOSIS — B952 Enterococcus as the cause of diseases classified elsewhere: Secondary | ICD-10-CM | POA: Diagnosis not present

## 2016-09-06 DIAGNOSIS — Z4781 Encounter for orthopedic aftercare following surgical amputation: Secondary | ICD-10-CM | POA: Diagnosis not present

## 2016-09-07 DIAGNOSIS — I69351 Hemiplegia and hemiparesis following cerebral infarction affecting right dominant side: Secondary | ICD-10-CM | POA: Diagnosis not present

## 2016-09-07 DIAGNOSIS — E1151 Type 2 diabetes mellitus with diabetic peripheral angiopathy without gangrene: Secondary | ICD-10-CM | POA: Diagnosis not present

## 2016-09-07 DIAGNOSIS — Z4781 Encounter for orthopedic aftercare following surgical amputation: Secondary | ICD-10-CM | POA: Diagnosis not present

## 2016-09-07 DIAGNOSIS — R7881 Bacteremia: Secondary | ICD-10-CM | POA: Diagnosis not present

## 2016-09-07 DIAGNOSIS — B952 Enterococcus as the cause of diseases classified elsewhere: Secondary | ICD-10-CM | POA: Diagnosis not present

## 2016-09-07 DIAGNOSIS — E1165 Type 2 diabetes mellitus with hyperglycemia: Secondary | ICD-10-CM | POA: Diagnosis not present

## 2016-09-08 ENCOUNTER — Encounter: Payer: Self-pay | Admitting: Internal Medicine

## 2016-09-08 NOTE — Assessment & Plan Note (Signed)
Along with PE/DVT; continue Xarelto 20 mg by mouth daily

## 2016-09-08 NOTE — Assessment & Plan Note (Signed)
Chronic; continue Prozac 10 mg by mouth daily

## 2016-09-08 NOTE — Assessment & Plan Note (Signed)
No recent fasting lipid panel; will continue Lipitor 40 mg by mouth daily; obtain fasting lipid panel

## 2016-09-10 DIAGNOSIS — E1151 Type 2 diabetes mellitus with diabetic peripheral angiopathy without gangrene: Secondary | ICD-10-CM | POA: Diagnosis not present

## 2016-09-10 DIAGNOSIS — R7881 Bacteremia: Secondary | ICD-10-CM | POA: Diagnosis not present

## 2016-09-10 DIAGNOSIS — B952 Enterococcus as the cause of diseases classified elsewhere: Secondary | ICD-10-CM | POA: Diagnosis not present

## 2016-09-10 DIAGNOSIS — I69351 Hemiplegia and hemiparesis following cerebral infarction affecting right dominant side: Secondary | ICD-10-CM | POA: Diagnosis not present

## 2016-09-10 DIAGNOSIS — E1165 Type 2 diabetes mellitus with hyperglycemia: Secondary | ICD-10-CM | POA: Diagnosis not present

## 2016-09-10 DIAGNOSIS — Z4781 Encounter for orthopedic aftercare following surgical amputation: Secondary | ICD-10-CM | POA: Diagnosis not present

## 2016-09-12 DIAGNOSIS — B952 Enterococcus as the cause of diseases classified elsewhere: Secondary | ICD-10-CM | POA: Diagnosis not present

## 2016-09-12 DIAGNOSIS — I69351 Hemiplegia and hemiparesis following cerebral infarction affecting right dominant side: Secondary | ICD-10-CM | POA: Diagnosis not present

## 2016-09-12 DIAGNOSIS — Z4781 Encounter for orthopedic aftercare following surgical amputation: Secondary | ICD-10-CM | POA: Diagnosis not present

## 2016-09-12 DIAGNOSIS — R7881 Bacteremia: Secondary | ICD-10-CM | POA: Diagnosis not present

## 2016-09-12 DIAGNOSIS — E1165 Type 2 diabetes mellitus with hyperglycemia: Secondary | ICD-10-CM | POA: Diagnosis not present

## 2016-09-12 DIAGNOSIS — E1151 Type 2 diabetes mellitus with diabetic peripheral angiopathy without gangrene: Secondary | ICD-10-CM | POA: Diagnosis not present

## 2016-09-13 DIAGNOSIS — E1151 Type 2 diabetes mellitus with diabetic peripheral angiopathy without gangrene: Secondary | ICD-10-CM | POA: Diagnosis not present

## 2016-09-13 DIAGNOSIS — E1165 Type 2 diabetes mellitus with hyperglycemia: Secondary | ICD-10-CM | POA: Diagnosis not present

## 2016-09-13 DIAGNOSIS — Z4781 Encounter for orthopedic aftercare following surgical amputation: Secondary | ICD-10-CM | POA: Diagnosis not present

## 2016-09-13 DIAGNOSIS — R7881 Bacteremia: Secondary | ICD-10-CM | POA: Diagnosis not present

## 2016-09-13 DIAGNOSIS — B952 Enterococcus as the cause of diseases classified elsewhere: Secondary | ICD-10-CM | POA: Diagnosis not present

## 2016-09-13 DIAGNOSIS — I69351 Hemiplegia and hemiparesis following cerebral infarction affecting right dominant side: Secondary | ICD-10-CM | POA: Diagnosis not present

## 2016-09-14 DIAGNOSIS — I69351 Hemiplegia and hemiparesis following cerebral infarction affecting right dominant side: Secondary | ICD-10-CM | POA: Diagnosis not present

## 2016-09-14 DIAGNOSIS — B952 Enterococcus as the cause of diseases classified elsewhere: Secondary | ICD-10-CM | POA: Diagnosis not present

## 2016-09-14 DIAGNOSIS — Z4781 Encounter for orthopedic aftercare following surgical amputation: Secondary | ICD-10-CM | POA: Diagnosis not present

## 2016-09-14 DIAGNOSIS — E1151 Type 2 diabetes mellitus with diabetic peripheral angiopathy without gangrene: Secondary | ICD-10-CM | POA: Diagnosis not present

## 2016-09-14 DIAGNOSIS — R7881 Bacteremia: Secondary | ICD-10-CM | POA: Diagnosis not present

## 2016-09-14 DIAGNOSIS — E1165 Type 2 diabetes mellitus with hyperglycemia: Secondary | ICD-10-CM | POA: Diagnosis not present

## 2016-09-17 ENCOUNTER — Encounter: Payer: Medicare Other | Admitting: Surgery

## 2016-09-17 DIAGNOSIS — E1151 Type 2 diabetes mellitus with diabetic peripheral angiopathy without gangrene: Secondary | ICD-10-CM | POA: Diagnosis not present

## 2016-09-17 DIAGNOSIS — R7881 Bacteremia: Secondary | ICD-10-CM | POA: Diagnosis not present

## 2016-09-17 DIAGNOSIS — Z4781 Encounter for orthopedic aftercare following surgical amputation: Secondary | ICD-10-CM | POA: Diagnosis not present

## 2016-09-17 DIAGNOSIS — B952 Enterococcus as the cause of diseases classified elsewhere: Secondary | ICD-10-CM | POA: Diagnosis not present

## 2016-09-17 DIAGNOSIS — I69351 Hemiplegia and hemiparesis following cerebral infarction affecting right dominant side: Secondary | ICD-10-CM | POA: Diagnosis not present

## 2016-09-17 DIAGNOSIS — E1165 Type 2 diabetes mellitus with hyperglycemia: Secondary | ICD-10-CM | POA: Diagnosis not present

## 2016-09-18 DIAGNOSIS — B952 Enterococcus as the cause of diseases classified elsewhere: Secondary | ICD-10-CM | POA: Diagnosis not present

## 2016-09-18 DIAGNOSIS — R7881 Bacteremia: Secondary | ICD-10-CM | POA: Diagnosis not present

## 2016-09-18 DIAGNOSIS — I69351 Hemiplegia and hemiparesis following cerebral infarction affecting right dominant side: Secondary | ICD-10-CM | POA: Diagnosis not present

## 2016-09-18 DIAGNOSIS — Z4781 Encounter for orthopedic aftercare following surgical amputation: Secondary | ICD-10-CM | POA: Diagnosis not present

## 2016-09-18 DIAGNOSIS — E1165 Type 2 diabetes mellitus with hyperglycemia: Secondary | ICD-10-CM | POA: Diagnosis not present

## 2016-09-18 DIAGNOSIS — E1151 Type 2 diabetes mellitus with diabetic peripheral angiopathy without gangrene: Secondary | ICD-10-CM | POA: Diagnosis not present

## 2016-09-19 ENCOUNTER — Telehealth: Payer: Self-pay | Admitting: *Deleted

## 2016-09-19 DIAGNOSIS — I69351 Hemiplegia and hemiparesis following cerebral infarction affecting right dominant side: Secondary | ICD-10-CM | POA: Diagnosis not present

## 2016-09-19 DIAGNOSIS — E1165 Type 2 diabetes mellitus with hyperglycemia: Secondary | ICD-10-CM | POA: Diagnosis not present

## 2016-09-19 DIAGNOSIS — R7881 Bacteremia: Secondary | ICD-10-CM | POA: Diagnosis not present

## 2016-09-19 DIAGNOSIS — B952 Enterococcus as the cause of diseases classified elsewhere: Secondary | ICD-10-CM | POA: Diagnosis not present

## 2016-09-19 DIAGNOSIS — Z4781 Encounter for orthopedic aftercare following surgical amputation: Secondary | ICD-10-CM | POA: Diagnosis not present

## 2016-09-19 DIAGNOSIS — E1151 Type 2 diabetes mellitus with diabetic peripheral angiopathy without gangrene: Secondary | ICD-10-CM | POA: Diagnosis not present

## 2016-09-19 NOTE — Telephone Encounter (Signed)
Returned call to Linna Hoff, RN with [facility].  Linna Hoff had inquired if it is ok for him to remove patients staples s/p bilateral AKA 08/16/2016.  The patient has not received his wheelchair and does not have transportation to get to his follow up appointment.  Dr Trula Slade gave a verbal order for Dan to remove the staples.  Dan states the incisions are well healed.  He is to notify us if complications should occur.

## 2016-09-21 DIAGNOSIS — Z4781 Encounter for orthopedic aftercare following surgical amputation: Secondary | ICD-10-CM | POA: Diagnosis not present

## 2016-09-21 DIAGNOSIS — E1151 Type 2 diabetes mellitus with diabetic peripheral angiopathy without gangrene: Secondary | ICD-10-CM | POA: Diagnosis not present

## 2016-09-21 DIAGNOSIS — I69351 Hemiplegia and hemiparesis following cerebral infarction affecting right dominant side: Secondary | ICD-10-CM | POA: Diagnosis not present

## 2016-09-21 DIAGNOSIS — R7881 Bacteremia: Secondary | ICD-10-CM | POA: Diagnosis not present

## 2016-09-21 DIAGNOSIS — E1165 Type 2 diabetes mellitus with hyperglycemia: Secondary | ICD-10-CM | POA: Diagnosis not present

## 2016-09-21 DIAGNOSIS — B952 Enterococcus as the cause of diseases classified elsewhere: Secondary | ICD-10-CM | POA: Diagnosis not present

## 2016-09-24 DIAGNOSIS — E1165 Type 2 diabetes mellitus with hyperglycemia: Secondary | ICD-10-CM | POA: Diagnosis not present

## 2016-09-24 DIAGNOSIS — E1151 Type 2 diabetes mellitus with diabetic peripheral angiopathy without gangrene: Secondary | ICD-10-CM | POA: Diagnosis not present

## 2016-09-24 DIAGNOSIS — I69351 Hemiplegia and hemiparesis following cerebral infarction affecting right dominant side: Secondary | ICD-10-CM | POA: Diagnosis not present

## 2016-09-24 DIAGNOSIS — B952 Enterococcus as the cause of diseases classified elsewhere: Secondary | ICD-10-CM | POA: Diagnosis not present

## 2016-09-24 DIAGNOSIS — R7881 Bacteremia: Secondary | ICD-10-CM | POA: Diagnosis not present

## 2016-09-24 DIAGNOSIS — Z4781 Encounter for orthopedic aftercare following surgical amputation: Secondary | ICD-10-CM | POA: Diagnosis not present

## 2016-09-26 DIAGNOSIS — I10 Essential (primary) hypertension: Secondary | ICD-10-CM | POA: Diagnosis not present

## 2016-09-26 DIAGNOSIS — I5032 Chronic diastolic (congestive) heart failure: Secondary | ICD-10-CM | POA: Diagnosis not present

## 2016-09-26 DIAGNOSIS — E1165 Type 2 diabetes mellitus with hyperglycemia: Secondary | ICD-10-CM | POA: Diagnosis not present

## 2016-09-26 DIAGNOSIS — F09 Unspecified mental disorder due to known physiological condition: Secondary | ICD-10-CM | POA: Diagnosis not present

## 2016-09-27 DIAGNOSIS — R7881 Bacteremia: Secondary | ICD-10-CM | POA: Diagnosis not present

## 2016-09-27 DIAGNOSIS — I69351 Hemiplegia and hemiparesis following cerebral infarction affecting right dominant side: Secondary | ICD-10-CM | POA: Diagnosis not present

## 2016-09-27 DIAGNOSIS — B952 Enterococcus as the cause of diseases classified elsewhere: Secondary | ICD-10-CM | POA: Diagnosis not present

## 2016-09-27 DIAGNOSIS — E1165 Type 2 diabetes mellitus with hyperglycemia: Secondary | ICD-10-CM | POA: Diagnosis not present

## 2016-09-27 DIAGNOSIS — Z4781 Encounter for orthopedic aftercare following surgical amputation: Secondary | ICD-10-CM | POA: Diagnosis not present

## 2016-09-27 DIAGNOSIS — E1151 Type 2 diabetes mellitus with diabetic peripheral angiopathy without gangrene: Secondary | ICD-10-CM | POA: Diagnosis not present

## 2016-09-28 DIAGNOSIS — E1165 Type 2 diabetes mellitus with hyperglycemia: Secondary | ICD-10-CM | POA: Diagnosis not present

## 2016-09-28 DIAGNOSIS — R7881 Bacteremia: Secondary | ICD-10-CM | POA: Diagnosis not present

## 2016-09-28 DIAGNOSIS — I69351 Hemiplegia and hemiparesis following cerebral infarction affecting right dominant side: Secondary | ICD-10-CM | POA: Diagnosis not present

## 2016-09-28 DIAGNOSIS — B952 Enterococcus as the cause of diseases classified elsewhere: Secondary | ICD-10-CM | POA: Diagnosis not present

## 2016-09-28 DIAGNOSIS — Z4781 Encounter for orthopedic aftercare following surgical amputation: Secondary | ICD-10-CM | POA: Diagnosis not present

## 2016-09-28 DIAGNOSIS — E1151 Type 2 diabetes mellitus with diabetic peripheral angiopathy without gangrene: Secondary | ICD-10-CM | POA: Diagnosis not present

## 2016-10-02 DIAGNOSIS — R7881 Bacteremia: Secondary | ICD-10-CM | POA: Diagnosis not present

## 2016-10-02 DIAGNOSIS — Z4781 Encounter for orthopedic aftercare following surgical amputation: Secondary | ICD-10-CM | POA: Diagnosis not present

## 2016-10-02 DIAGNOSIS — E1165 Type 2 diabetes mellitus with hyperglycemia: Secondary | ICD-10-CM | POA: Diagnosis not present

## 2016-10-02 DIAGNOSIS — E1151 Type 2 diabetes mellitus with diabetic peripheral angiopathy without gangrene: Secondary | ICD-10-CM | POA: Diagnosis not present

## 2016-10-02 DIAGNOSIS — I69351 Hemiplegia and hemiparesis following cerebral infarction affecting right dominant side: Secondary | ICD-10-CM | POA: Diagnosis not present

## 2016-10-02 DIAGNOSIS — B952 Enterococcus as the cause of diseases classified elsewhere: Secondary | ICD-10-CM | POA: Diagnosis not present

## 2016-10-03 DIAGNOSIS — R7881 Bacteremia: Secondary | ICD-10-CM | POA: Diagnosis not present

## 2016-10-03 DIAGNOSIS — Z4781 Encounter for orthopedic aftercare following surgical amputation: Secondary | ICD-10-CM | POA: Diagnosis not present

## 2016-10-03 DIAGNOSIS — E1165 Type 2 diabetes mellitus with hyperglycemia: Secondary | ICD-10-CM | POA: Diagnosis not present

## 2016-10-03 DIAGNOSIS — E1151 Type 2 diabetes mellitus with diabetic peripheral angiopathy without gangrene: Secondary | ICD-10-CM | POA: Diagnosis not present

## 2016-10-03 DIAGNOSIS — I69351 Hemiplegia and hemiparesis following cerebral infarction affecting right dominant side: Secondary | ICD-10-CM | POA: Diagnosis not present

## 2016-10-03 DIAGNOSIS — B952 Enterococcus as the cause of diseases classified elsewhere: Secondary | ICD-10-CM | POA: Diagnosis not present

## 2016-10-04 DIAGNOSIS — E1151 Type 2 diabetes mellitus with diabetic peripheral angiopathy without gangrene: Secondary | ICD-10-CM | POA: Diagnosis not present

## 2016-10-04 DIAGNOSIS — I69351 Hemiplegia and hemiparesis following cerebral infarction affecting right dominant side: Secondary | ICD-10-CM | POA: Diagnosis not present

## 2016-10-04 DIAGNOSIS — Z4781 Encounter for orthopedic aftercare following surgical amputation: Secondary | ICD-10-CM | POA: Diagnosis not present

## 2016-10-04 DIAGNOSIS — B952 Enterococcus as the cause of diseases classified elsewhere: Secondary | ICD-10-CM | POA: Diagnosis not present

## 2016-10-04 DIAGNOSIS — R7881 Bacteremia: Secondary | ICD-10-CM | POA: Diagnosis not present

## 2016-10-04 DIAGNOSIS — E1165 Type 2 diabetes mellitus with hyperglycemia: Secondary | ICD-10-CM | POA: Diagnosis not present

## 2016-10-09 DIAGNOSIS — E1151 Type 2 diabetes mellitus with diabetic peripheral angiopathy without gangrene: Secondary | ICD-10-CM | POA: Diagnosis not present

## 2016-10-09 DIAGNOSIS — R7881 Bacteremia: Secondary | ICD-10-CM | POA: Diagnosis not present

## 2016-10-09 DIAGNOSIS — B952 Enterococcus as the cause of diseases classified elsewhere: Secondary | ICD-10-CM | POA: Diagnosis not present

## 2016-10-09 DIAGNOSIS — Z4781 Encounter for orthopedic aftercare following surgical amputation: Secondary | ICD-10-CM | POA: Diagnosis not present

## 2016-10-09 DIAGNOSIS — E1165 Type 2 diabetes mellitus with hyperglycemia: Secondary | ICD-10-CM | POA: Diagnosis not present

## 2016-10-09 DIAGNOSIS — I69351 Hemiplegia and hemiparesis following cerebral infarction affecting right dominant side: Secondary | ICD-10-CM | POA: Diagnosis not present

## 2016-10-10 DIAGNOSIS — Z4781 Encounter for orthopedic aftercare following surgical amputation: Secondary | ICD-10-CM | POA: Diagnosis not present

## 2016-10-10 DIAGNOSIS — B952 Enterococcus as the cause of diseases classified elsewhere: Secondary | ICD-10-CM | POA: Diagnosis not present

## 2016-10-10 DIAGNOSIS — E1151 Type 2 diabetes mellitus with diabetic peripheral angiopathy without gangrene: Secondary | ICD-10-CM | POA: Diagnosis not present

## 2016-10-10 DIAGNOSIS — I69351 Hemiplegia and hemiparesis following cerebral infarction affecting right dominant side: Secondary | ICD-10-CM | POA: Diagnosis not present

## 2016-10-10 DIAGNOSIS — R7881 Bacteremia: Secondary | ICD-10-CM | POA: Diagnosis not present

## 2016-10-10 DIAGNOSIS — E1165 Type 2 diabetes mellitus with hyperglycemia: Secondary | ICD-10-CM | POA: Diagnosis not present

## 2016-10-11 DIAGNOSIS — B952 Enterococcus as the cause of diseases classified elsewhere: Secondary | ICD-10-CM | POA: Diagnosis not present

## 2016-10-11 DIAGNOSIS — R7881 Bacteremia: Secondary | ICD-10-CM | POA: Diagnosis not present

## 2016-10-11 DIAGNOSIS — Z4781 Encounter for orthopedic aftercare following surgical amputation: Secondary | ICD-10-CM | POA: Diagnosis not present

## 2016-10-11 DIAGNOSIS — E1151 Type 2 diabetes mellitus with diabetic peripheral angiopathy without gangrene: Secondary | ICD-10-CM | POA: Diagnosis not present

## 2016-10-11 DIAGNOSIS — E1165 Type 2 diabetes mellitus with hyperglycemia: Secondary | ICD-10-CM | POA: Diagnosis not present

## 2016-10-11 DIAGNOSIS — I69351 Hemiplegia and hemiparesis following cerebral infarction affecting right dominant side: Secondary | ICD-10-CM | POA: Diagnosis not present

## 2016-10-12 DIAGNOSIS — R7881 Bacteremia: Secondary | ICD-10-CM | POA: Diagnosis not present

## 2016-10-12 DIAGNOSIS — E1165 Type 2 diabetes mellitus with hyperglycemia: Secondary | ICD-10-CM | POA: Diagnosis not present

## 2016-10-12 DIAGNOSIS — E1151 Type 2 diabetes mellitus with diabetic peripheral angiopathy without gangrene: Secondary | ICD-10-CM | POA: Diagnosis not present

## 2016-10-12 DIAGNOSIS — B952 Enterococcus as the cause of diseases classified elsewhere: Secondary | ICD-10-CM | POA: Diagnosis not present

## 2016-10-12 DIAGNOSIS — I69351 Hemiplegia and hemiparesis following cerebral infarction affecting right dominant side: Secondary | ICD-10-CM | POA: Diagnosis not present

## 2016-10-12 DIAGNOSIS — Z4781 Encounter for orthopedic aftercare following surgical amputation: Secondary | ICD-10-CM | POA: Diagnosis not present

## 2016-10-17 DIAGNOSIS — Z4781 Encounter for orthopedic aftercare following surgical amputation: Secondary | ICD-10-CM | POA: Diagnosis not present

## 2016-10-17 DIAGNOSIS — E1165 Type 2 diabetes mellitus with hyperglycemia: Secondary | ICD-10-CM | POA: Diagnosis not present

## 2016-10-17 DIAGNOSIS — B952 Enterococcus as the cause of diseases classified elsewhere: Secondary | ICD-10-CM | POA: Diagnosis not present

## 2016-10-17 DIAGNOSIS — R7881 Bacteremia: Secondary | ICD-10-CM | POA: Diagnosis not present

## 2016-10-17 DIAGNOSIS — I69351 Hemiplegia and hemiparesis following cerebral infarction affecting right dominant side: Secondary | ICD-10-CM | POA: Diagnosis not present

## 2016-10-17 DIAGNOSIS — E1151 Type 2 diabetes mellitus with diabetic peripheral angiopathy without gangrene: Secondary | ICD-10-CM | POA: Diagnosis not present

## 2016-10-18 DIAGNOSIS — Z4781 Encounter for orthopedic aftercare following surgical amputation: Secondary | ICD-10-CM | POA: Diagnosis not present

## 2016-10-18 DIAGNOSIS — R7881 Bacteremia: Secondary | ICD-10-CM | POA: Diagnosis not present

## 2016-10-18 DIAGNOSIS — B952 Enterococcus as the cause of diseases classified elsewhere: Secondary | ICD-10-CM | POA: Diagnosis not present

## 2016-10-18 DIAGNOSIS — E1151 Type 2 diabetes mellitus with diabetic peripheral angiopathy without gangrene: Secondary | ICD-10-CM | POA: Diagnosis not present

## 2016-10-18 DIAGNOSIS — E1165 Type 2 diabetes mellitus with hyperglycemia: Secondary | ICD-10-CM | POA: Diagnosis not present

## 2016-10-18 DIAGNOSIS — I69351 Hemiplegia and hemiparesis following cerebral infarction affecting right dominant side: Secondary | ICD-10-CM | POA: Diagnosis not present

## 2016-10-23 DIAGNOSIS — E1151 Type 2 diabetes mellitus with diabetic peripheral angiopathy without gangrene: Secondary | ICD-10-CM | POA: Diagnosis not present

## 2016-10-24 DIAGNOSIS — R7881 Bacteremia: Secondary | ICD-10-CM | POA: Diagnosis not present

## 2016-10-24 DIAGNOSIS — Z4781 Encounter for orthopedic aftercare following surgical amputation: Secondary | ICD-10-CM | POA: Diagnosis not present

## 2016-10-24 DIAGNOSIS — I69351 Hemiplegia and hemiparesis following cerebral infarction affecting right dominant side: Secondary | ICD-10-CM | POA: Diagnosis not present

## 2016-10-24 DIAGNOSIS — B952 Enterococcus as the cause of diseases classified elsewhere: Secondary | ICD-10-CM | POA: Diagnosis not present

## 2016-10-24 DIAGNOSIS — E1165 Type 2 diabetes mellitus with hyperglycemia: Secondary | ICD-10-CM | POA: Diagnosis not present

## 2016-10-24 DIAGNOSIS — E1151 Type 2 diabetes mellitus with diabetic peripheral angiopathy without gangrene: Secondary | ICD-10-CM | POA: Diagnosis not present

## 2016-10-25 DIAGNOSIS — L8990 Pressure ulcer of unspecified site, unspecified stage: Secondary | ICD-10-CM | POA: Diagnosis not present

## 2016-10-25 DIAGNOSIS — R339 Retention of urine, unspecified: Secondary | ICD-10-CM | POA: Diagnosis not present

## 2016-10-25 DIAGNOSIS — E1165 Type 2 diabetes mellitus with hyperglycemia: Secondary | ICD-10-CM | POA: Diagnosis not present

## 2016-10-25 DIAGNOSIS — Z4781 Encounter for orthopedic aftercare following surgical amputation: Secondary | ICD-10-CM | POA: Diagnosis not present

## 2016-10-25 DIAGNOSIS — F09 Unspecified mental disorder due to known physiological condition: Secondary | ICD-10-CM | POA: Diagnosis not present

## 2016-10-25 DIAGNOSIS — B952 Enterococcus as the cause of diseases classified elsewhere: Secondary | ICD-10-CM | POA: Diagnosis not present

## 2016-10-25 DIAGNOSIS — E1151 Type 2 diabetes mellitus with diabetic peripheral angiopathy without gangrene: Secondary | ICD-10-CM | POA: Diagnosis not present

## 2016-10-25 DIAGNOSIS — I69351 Hemiplegia and hemiparesis following cerebral infarction affecting right dominant side: Secondary | ICD-10-CM | POA: Diagnosis not present

## 2016-10-25 DIAGNOSIS — R7881 Bacteremia: Secondary | ICD-10-CM | POA: Diagnosis not present

## 2016-10-30 DIAGNOSIS — E1165 Type 2 diabetes mellitus with hyperglycemia: Secondary | ICD-10-CM | POA: Diagnosis not present

## 2016-10-30 DIAGNOSIS — L89152 Pressure ulcer of sacral region, stage 2: Secondary | ICD-10-CM | POA: Diagnosis not present

## 2016-10-30 DIAGNOSIS — I69351 Hemiplegia and hemiparesis following cerebral infarction affecting right dominant side: Secondary | ICD-10-CM | POA: Diagnosis not present

## 2016-10-30 DIAGNOSIS — I69322 Dysarthria following cerebral infarction: Secondary | ICD-10-CM | POA: Diagnosis not present

## 2016-10-30 DIAGNOSIS — L89142 Pressure ulcer of left lower back, stage 2: Secondary | ICD-10-CM | POA: Diagnosis not present

## 2016-10-30 DIAGNOSIS — L89312 Pressure ulcer of right buttock, stage 2: Secondary | ICD-10-CM | POA: Diagnosis not present

## 2016-10-31 DIAGNOSIS — I69351 Hemiplegia and hemiparesis following cerebral infarction affecting right dominant side: Secondary | ICD-10-CM | POA: Diagnosis not present

## 2016-10-31 DIAGNOSIS — L89312 Pressure ulcer of right buttock, stage 2: Secondary | ICD-10-CM | POA: Diagnosis not present

## 2016-10-31 DIAGNOSIS — E1165 Type 2 diabetes mellitus with hyperglycemia: Secondary | ICD-10-CM | POA: Diagnosis not present

## 2016-10-31 DIAGNOSIS — I69322 Dysarthria following cerebral infarction: Secondary | ICD-10-CM | POA: Diagnosis not present

## 2016-10-31 DIAGNOSIS — L89142 Pressure ulcer of left lower back, stage 2: Secondary | ICD-10-CM | POA: Diagnosis not present

## 2016-10-31 DIAGNOSIS — L89152 Pressure ulcer of sacral region, stage 2: Secondary | ICD-10-CM | POA: Diagnosis not present

## 2016-11-01 DIAGNOSIS — L89142 Pressure ulcer of left lower back, stage 2: Secondary | ICD-10-CM | POA: Diagnosis not present

## 2016-11-01 DIAGNOSIS — L89152 Pressure ulcer of sacral region, stage 2: Secondary | ICD-10-CM | POA: Diagnosis not present

## 2016-11-01 DIAGNOSIS — I69351 Hemiplegia and hemiparesis following cerebral infarction affecting right dominant side: Secondary | ICD-10-CM | POA: Diagnosis not present

## 2016-11-01 DIAGNOSIS — E1165 Type 2 diabetes mellitus with hyperglycemia: Secondary | ICD-10-CM | POA: Diagnosis not present

## 2016-11-01 DIAGNOSIS — I69322 Dysarthria following cerebral infarction: Secondary | ICD-10-CM | POA: Diagnosis not present

## 2016-11-01 DIAGNOSIS — L89312 Pressure ulcer of right buttock, stage 2: Secondary | ICD-10-CM | POA: Diagnosis not present

## 2016-11-02 DIAGNOSIS — E1165 Type 2 diabetes mellitus with hyperglycemia: Secondary | ICD-10-CM | POA: Diagnosis not present

## 2016-11-02 DIAGNOSIS — L89142 Pressure ulcer of left lower back, stage 2: Secondary | ICD-10-CM | POA: Diagnosis not present

## 2016-11-02 DIAGNOSIS — I69351 Hemiplegia and hemiparesis following cerebral infarction affecting right dominant side: Secondary | ICD-10-CM | POA: Diagnosis not present

## 2016-11-02 DIAGNOSIS — I69322 Dysarthria following cerebral infarction: Secondary | ICD-10-CM | POA: Diagnosis not present

## 2016-11-02 DIAGNOSIS — L89312 Pressure ulcer of right buttock, stage 2: Secondary | ICD-10-CM | POA: Diagnosis not present

## 2016-11-02 DIAGNOSIS — L89152 Pressure ulcer of sacral region, stage 2: Secondary | ICD-10-CM | POA: Diagnosis not present

## 2016-11-07 DIAGNOSIS — L89312 Pressure ulcer of right buttock, stage 2: Secondary | ICD-10-CM | POA: Diagnosis not present

## 2016-11-07 DIAGNOSIS — I69322 Dysarthria following cerebral infarction: Secondary | ICD-10-CM | POA: Diagnosis not present

## 2016-11-07 DIAGNOSIS — E1165 Type 2 diabetes mellitus with hyperglycemia: Secondary | ICD-10-CM | POA: Diagnosis not present

## 2016-11-07 DIAGNOSIS — L89142 Pressure ulcer of left lower back, stage 2: Secondary | ICD-10-CM | POA: Diagnosis not present

## 2016-11-07 DIAGNOSIS — I69351 Hemiplegia and hemiparesis following cerebral infarction affecting right dominant side: Secondary | ICD-10-CM | POA: Diagnosis not present

## 2016-11-07 DIAGNOSIS — L89152 Pressure ulcer of sacral region, stage 2: Secondary | ICD-10-CM | POA: Diagnosis not present

## 2016-11-08 DIAGNOSIS — L89152 Pressure ulcer of sacral region, stage 2: Secondary | ICD-10-CM | POA: Diagnosis not present

## 2016-11-08 DIAGNOSIS — L89142 Pressure ulcer of left lower back, stage 2: Secondary | ICD-10-CM | POA: Diagnosis not present

## 2016-11-08 DIAGNOSIS — I69322 Dysarthria following cerebral infarction: Secondary | ICD-10-CM | POA: Diagnosis not present

## 2016-11-08 DIAGNOSIS — E1165 Type 2 diabetes mellitus with hyperglycemia: Secondary | ICD-10-CM | POA: Diagnosis not present

## 2016-11-08 DIAGNOSIS — I69351 Hemiplegia and hemiparesis following cerebral infarction affecting right dominant side: Secondary | ICD-10-CM | POA: Diagnosis not present

## 2016-11-08 DIAGNOSIS — L89312 Pressure ulcer of right buttock, stage 2: Secondary | ICD-10-CM | POA: Diagnosis not present

## 2016-11-09 DIAGNOSIS — L89142 Pressure ulcer of left lower back, stage 2: Secondary | ICD-10-CM | POA: Diagnosis not present

## 2016-11-09 DIAGNOSIS — E1165 Type 2 diabetes mellitus with hyperglycemia: Secondary | ICD-10-CM | POA: Diagnosis not present

## 2016-11-09 DIAGNOSIS — L89312 Pressure ulcer of right buttock, stage 2: Secondary | ICD-10-CM | POA: Diagnosis not present

## 2016-11-09 DIAGNOSIS — I69351 Hemiplegia and hemiparesis following cerebral infarction affecting right dominant side: Secondary | ICD-10-CM | POA: Diagnosis not present

## 2016-11-09 DIAGNOSIS — L89152 Pressure ulcer of sacral region, stage 2: Secondary | ICD-10-CM | POA: Diagnosis not present

## 2016-11-09 DIAGNOSIS — I69322 Dysarthria following cerebral infarction: Secondary | ICD-10-CM | POA: Diagnosis not present

## 2016-11-10 NOTE — Progress Notes (Signed)
This encounter was created in error - please disregard.

## 2016-11-14 DIAGNOSIS — I69322 Dysarthria following cerebral infarction: Secondary | ICD-10-CM | POA: Diagnosis not present

## 2016-11-14 DIAGNOSIS — E1165 Type 2 diabetes mellitus with hyperglycemia: Secondary | ICD-10-CM | POA: Diagnosis not present

## 2016-11-14 DIAGNOSIS — L89152 Pressure ulcer of sacral region, stage 2: Secondary | ICD-10-CM | POA: Diagnosis not present

## 2016-11-14 DIAGNOSIS — I69351 Hemiplegia and hemiparesis following cerebral infarction affecting right dominant side: Secondary | ICD-10-CM | POA: Diagnosis not present

## 2016-11-14 DIAGNOSIS — L89142 Pressure ulcer of left lower back, stage 2: Secondary | ICD-10-CM | POA: Diagnosis not present

## 2016-11-14 DIAGNOSIS — L89312 Pressure ulcer of right buttock, stage 2: Secondary | ICD-10-CM | POA: Diagnosis not present

## 2016-11-16 DIAGNOSIS — I69322 Dysarthria following cerebral infarction: Secondary | ICD-10-CM | POA: Diagnosis not present

## 2016-11-16 DIAGNOSIS — I69351 Hemiplegia and hemiparesis following cerebral infarction affecting right dominant side: Secondary | ICD-10-CM | POA: Diagnosis not present

## 2016-11-16 DIAGNOSIS — E1165 Type 2 diabetes mellitus with hyperglycemia: Secondary | ICD-10-CM | POA: Diagnosis not present

## 2016-11-16 DIAGNOSIS — L89152 Pressure ulcer of sacral region, stage 2: Secondary | ICD-10-CM | POA: Diagnosis not present

## 2016-11-16 DIAGNOSIS — L89142 Pressure ulcer of left lower back, stage 2: Secondary | ICD-10-CM | POA: Diagnosis not present

## 2016-11-16 DIAGNOSIS — L89312 Pressure ulcer of right buttock, stage 2: Secondary | ICD-10-CM | POA: Diagnosis not present

## 2016-11-21 DIAGNOSIS — I69322 Dysarthria following cerebral infarction: Secondary | ICD-10-CM | POA: Diagnosis not present

## 2016-11-21 DIAGNOSIS — L89142 Pressure ulcer of left lower back, stage 2: Secondary | ICD-10-CM | POA: Diagnosis not present

## 2016-11-21 DIAGNOSIS — L89152 Pressure ulcer of sacral region, stage 2: Secondary | ICD-10-CM | POA: Diagnosis not present

## 2016-11-21 DIAGNOSIS — L89312 Pressure ulcer of right buttock, stage 2: Secondary | ICD-10-CM | POA: Diagnosis not present

## 2016-11-21 DIAGNOSIS — E1165 Type 2 diabetes mellitus with hyperglycemia: Secondary | ICD-10-CM | POA: Diagnosis not present

## 2016-11-21 DIAGNOSIS — I69351 Hemiplegia and hemiparesis following cerebral infarction affecting right dominant side: Secondary | ICD-10-CM | POA: Diagnosis not present

## 2016-11-22 DIAGNOSIS — L89142 Pressure ulcer of left lower back, stage 2: Secondary | ICD-10-CM | POA: Diagnosis not present

## 2016-11-22 DIAGNOSIS — I10 Essential (primary) hypertension: Secondary | ICD-10-CM | POA: Diagnosis not present

## 2016-11-22 DIAGNOSIS — L89152 Pressure ulcer of sacral region, stage 2: Secondary | ICD-10-CM | POA: Diagnosis not present

## 2016-11-22 DIAGNOSIS — Z86718 Personal history of other venous thrombosis and embolism: Secondary | ICD-10-CM | POA: Diagnosis not present

## 2016-11-22 DIAGNOSIS — L8990 Pressure ulcer of unspecified site, unspecified stage: Secondary | ICD-10-CM | POA: Diagnosis not present

## 2016-11-22 DIAGNOSIS — I69322 Dysarthria following cerebral infarction: Secondary | ICD-10-CM | POA: Diagnosis not present

## 2016-11-22 DIAGNOSIS — L89312 Pressure ulcer of right buttock, stage 2: Secondary | ICD-10-CM | POA: Diagnosis not present

## 2016-11-22 DIAGNOSIS — E1165 Type 2 diabetes mellitus with hyperglycemia: Secondary | ICD-10-CM | POA: Diagnosis not present

## 2016-11-22 DIAGNOSIS — I69351 Hemiplegia and hemiparesis following cerebral infarction affecting right dominant side: Secondary | ICD-10-CM | POA: Diagnosis not present

## 2016-11-23 DIAGNOSIS — I69322 Dysarthria following cerebral infarction: Secondary | ICD-10-CM | POA: Diagnosis not present

## 2016-11-23 DIAGNOSIS — L89142 Pressure ulcer of left lower back, stage 2: Secondary | ICD-10-CM | POA: Diagnosis not present

## 2016-11-23 DIAGNOSIS — L89312 Pressure ulcer of right buttock, stage 2: Secondary | ICD-10-CM | POA: Diagnosis not present

## 2016-11-23 DIAGNOSIS — E1165 Type 2 diabetes mellitus with hyperglycemia: Secondary | ICD-10-CM | POA: Diagnosis not present

## 2016-11-23 DIAGNOSIS — I69351 Hemiplegia and hemiparesis following cerebral infarction affecting right dominant side: Secondary | ICD-10-CM | POA: Diagnosis not present

## 2016-11-23 DIAGNOSIS — L89152 Pressure ulcer of sacral region, stage 2: Secondary | ICD-10-CM | POA: Diagnosis not present

## 2016-11-25 DIAGNOSIS — E1151 Type 2 diabetes mellitus with diabetic peripheral angiopathy without gangrene: Secondary | ICD-10-CM | POA: Diagnosis not present

## 2016-11-28 DIAGNOSIS — L89312 Pressure ulcer of right buttock, stage 2: Secondary | ICD-10-CM | POA: Diagnosis not present

## 2016-11-28 DIAGNOSIS — L89152 Pressure ulcer of sacral region, stage 2: Secondary | ICD-10-CM | POA: Diagnosis not present

## 2016-11-28 DIAGNOSIS — I69351 Hemiplegia and hemiparesis following cerebral infarction affecting right dominant side: Secondary | ICD-10-CM | POA: Diagnosis not present

## 2016-11-28 DIAGNOSIS — E1165 Type 2 diabetes mellitus with hyperglycemia: Secondary | ICD-10-CM | POA: Diagnosis not present

## 2016-11-28 DIAGNOSIS — I69322 Dysarthria following cerebral infarction: Secondary | ICD-10-CM | POA: Diagnosis not present

## 2016-11-28 DIAGNOSIS — L89142 Pressure ulcer of left lower back, stage 2: Secondary | ICD-10-CM | POA: Diagnosis not present

## 2016-12-06 DIAGNOSIS — I69322 Dysarthria following cerebral infarction: Secondary | ICD-10-CM | POA: Diagnosis not present

## 2016-12-06 DIAGNOSIS — E1165 Type 2 diabetes mellitus with hyperglycemia: Secondary | ICD-10-CM | POA: Diagnosis not present

## 2016-12-06 DIAGNOSIS — L89152 Pressure ulcer of sacral region, stage 2: Secondary | ICD-10-CM | POA: Diagnosis not present

## 2016-12-06 DIAGNOSIS — I69351 Hemiplegia and hemiparesis following cerebral infarction affecting right dominant side: Secondary | ICD-10-CM | POA: Diagnosis not present

## 2016-12-06 DIAGNOSIS — L89142 Pressure ulcer of left lower back, stage 2: Secondary | ICD-10-CM | POA: Diagnosis not present

## 2016-12-06 DIAGNOSIS — L89312 Pressure ulcer of right buttock, stage 2: Secondary | ICD-10-CM | POA: Diagnosis not present

## 2016-12-12 DIAGNOSIS — L89312 Pressure ulcer of right buttock, stage 2: Secondary | ICD-10-CM | POA: Diagnosis not present

## 2016-12-12 DIAGNOSIS — I69322 Dysarthria following cerebral infarction: Secondary | ICD-10-CM | POA: Diagnosis not present

## 2016-12-12 DIAGNOSIS — L89152 Pressure ulcer of sacral region, stage 2: Secondary | ICD-10-CM | POA: Diagnosis not present

## 2016-12-12 DIAGNOSIS — E1165 Type 2 diabetes mellitus with hyperglycemia: Secondary | ICD-10-CM | POA: Diagnosis not present

## 2016-12-12 DIAGNOSIS — I69351 Hemiplegia and hemiparesis following cerebral infarction affecting right dominant side: Secondary | ICD-10-CM | POA: Diagnosis not present

## 2016-12-12 DIAGNOSIS — L89142 Pressure ulcer of left lower back, stage 2: Secondary | ICD-10-CM | POA: Diagnosis not present

## 2016-12-20 DIAGNOSIS — I69322 Dysarthria following cerebral infarction: Secondary | ICD-10-CM | POA: Diagnosis not present

## 2016-12-20 DIAGNOSIS — E1165 Type 2 diabetes mellitus with hyperglycemia: Secondary | ICD-10-CM | POA: Diagnosis not present

## 2016-12-20 DIAGNOSIS — L89312 Pressure ulcer of right buttock, stage 2: Secondary | ICD-10-CM | POA: Diagnosis not present

## 2016-12-20 DIAGNOSIS — I69351 Hemiplegia and hemiparesis following cerebral infarction affecting right dominant side: Secondary | ICD-10-CM | POA: Diagnosis not present

## 2016-12-20 DIAGNOSIS — L89152 Pressure ulcer of sacral region, stage 2: Secondary | ICD-10-CM | POA: Diagnosis not present

## 2016-12-20 DIAGNOSIS — L89142 Pressure ulcer of left lower back, stage 2: Secondary | ICD-10-CM | POA: Diagnosis not present

## 2016-12-24 DIAGNOSIS — I69951 Hemiplegia and hemiparesis following unspecified cerebrovascular disease affecting right dominant side: Secondary | ICD-10-CM | POA: Diagnosis not present

## 2016-12-24 DIAGNOSIS — E1165 Type 2 diabetes mellitus with hyperglycemia: Secondary | ICD-10-CM | POA: Diagnosis not present

## 2016-12-24 DIAGNOSIS — I5032 Chronic diastolic (congestive) heart failure: Secondary | ICD-10-CM | POA: Diagnosis not present

## 2016-12-24 DIAGNOSIS — F59 Unspecified behavioral syndromes associated with physiological disturbances and physical factors: Secondary | ICD-10-CM | POA: Diagnosis not present

## 2016-12-24 DIAGNOSIS — R339 Retention of urine, unspecified: Secondary | ICD-10-CM | POA: Diagnosis not present

## 2016-12-24 DIAGNOSIS — G4700 Insomnia, unspecified: Secondary | ICD-10-CM | POA: Diagnosis not present

## 2016-12-24 DIAGNOSIS — L8990 Pressure ulcer of unspecified site, unspecified stage: Secondary | ICD-10-CM | POA: Diagnosis not present

## 2016-12-25 DIAGNOSIS — L8942 Pressure ulcer of contiguous site of back, buttock and hip, stage 2: Secondary | ICD-10-CM | POA: Diagnosis not present

## 2016-12-25 DIAGNOSIS — I2510 Atherosclerotic heart disease of native coronary artery without angina pectoris: Secondary | ICD-10-CM | POA: Diagnosis not present

## 2016-12-25 DIAGNOSIS — I5032 Chronic diastolic (congestive) heart failure: Secondary | ICD-10-CM | POA: Diagnosis not present

## 2016-12-25 DIAGNOSIS — E1165 Type 2 diabetes mellitus with hyperglycemia: Secondary | ICD-10-CM | POA: Diagnosis not present

## 2016-12-25 DIAGNOSIS — I110 Hypertensive heart disease with heart failure: Secondary | ICD-10-CM | POA: Diagnosis not present

## 2016-12-25 DIAGNOSIS — I69351 Hemiplegia and hemiparesis following cerebral infarction affecting right dominant side: Secondary | ICD-10-CM | POA: Diagnosis not present

## 2016-12-26 DIAGNOSIS — E1165 Type 2 diabetes mellitus with hyperglycemia: Secondary | ICD-10-CM | POA: Diagnosis not present

## 2017-01-02 DIAGNOSIS — E1165 Type 2 diabetes mellitus with hyperglycemia: Secondary | ICD-10-CM | POA: Diagnosis not present

## 2017-01-02 DIAGNOSIS — L8942 Pressure ulcer of contiguous site of back, buttock and hip, stage 2: Secondary | ICD-10-CM | POA: Diagnosis not present

## 2017-01-02 DIAGNOSIS — I69351 Hemiplegia and hemiparesis following cerebral infarction affecting right dominant side: Secondary | ICD-10-CM | POA: Diagnosis not present

## 2017-01-02 DIAGNOSIS — I2510 Atherosclerotic heart disease of native coronary artery without angina pectoris: Secondary | ICD-10-CM | POA: Diagnosis not present

## 2017-01-02 DIAGNOSIS — I110 Hypertensive heart disease with heart failure: Secondary | ICD-10-CM | POA: Diagnosis not present

## 2017-01-02 DIAGNOSIS — I5032 Chronic diastolic (congestive) heart failure: Secondary | ICD-10-CM | POA: Diagnosis not present

## 2017-01-10 DIAGNOSIS — I110 Hypertensive heart disease with heart failure: Secondary | ICD-10-CM | POA: Diagnosis not present

## 2017-01-10 DIAGNOSIS — I69351 Hemiplegia and hemiparesis following cerebral infarction affecting right dominant side: Secondary | ICD-10-CM | POA: Diagnosis not present

## 2017-01-10 DIAGNOSIS — L8942 Pressure ulcer of contiguous site of back, buttock and hip, stage 2: Secondary | ICD-10-CM | POA: Diagnosis not present

## 2017-01-10 DIAGNOSIS — I5032 Chronic diastolic (congestive) heart failure: Secondary | ICD-10-CM | POA: Diagnosis not present

## 2017-01-10 DIAGNOSIS — E1165 Type 2 diabetes mellitus with hyperglycemia: Secondary | ICD-10-CM | POA: Diagnosis not present

## 2017-01-10 DIAGNOSIS — I2510 Atherosclerotic heart disease of native coronary artery without angina pectoris: Secondary | ICD-10-CM | POA: Diagnosis not present

## 2017-01-15 DIAGNOSIS — I110 Hypertensive heart disease with heart failure: Secondary | ICD-10-CM | POA: Diagnosis not present

## 2017-01-15 DIAGNOSIS — E1165 Type 2 diabetes mellitus with hyperglycemia: Secondary | ICD-10-CM | POA: Diagnosis not present

## 2017-01-15 DIAGNOSIS — I69351 Hemiplegia and hemiparesis following cerebral infarction affecting right dominant side: Secondary | ICD-10-CM | POA: Diagnosis not present

## 2017-01-15 DIAGNOSIS — I5032 Chronic diastolic (congestive) heart failure: Secondary | ICD-10-CM | POA: Diagnosis not present

## 2017-01-15 DIAGNOSIS — L8942 Pressure ulcer of contiguous site of back, buttock and hip, stage 2: Secondary | ICD-10-CM | POA: Diagnosis not present

## 2017-01-15 DIAGNOSIS — I2510 Atherosclerotic heart disease of native coronary artery without angina pectoris: Secondary | ICD-10-CM | POA: Diagnosis not present

## 2017-01-16 DIAGNOSIS — I69351 Hemiplegia and hemiparesis following cerebral infarction affecting right dominant side: Secondary | ICD-10-CM | POA: Diagnosis not present

## 2017-01-16 DIAGNOSIS — I2510 Atherosclerotic heart disease of native coronary artery without angina pectoris: Secondary | ICD-10-CM | POA: Diagnosis not present

## 2017-01-16 DIAGNOSIS — L8942 Pressure ulcer of contiguous site of back, buttock and hip, stage 2: Secondary | ICD-10-CM | POA: Diagnosis not present

## 2017-01-16 DIAGNOSIS — I110 Hypertensive heart disease with heart failure: Secondary | ICD-10-CM | POA: Diagnosis not present

## 2017-01-16 DIAGNOSIS — E1165 Type 2 diabetes mellitus with hyperglycemia: Secondary | ICD-10-CM | POA: Diagnosis not present

## 2017-01-16 DIAGNOSIS — I5032 Chronic diastolic (congestive) heart failure: Secondary | ICD-10-CM | POA: Diagnosis not present

## 2017-01-23 DIAGNOSIS — I69351 Hemiplegia and hemiparesis following cerebral infarction affecting right dominant side: Secondary | ICD-10-CM | POA: Diagnosis not present

## 2017-01-23 DIAGNOSIS — F59 Unspecified behavioral syndromes associated with physiological disturbances and physical factors: Secondary | ICD-10-CM | POA: Diagnosis not present

## 2017-01-23 DIAGNOSIS — L8990 Pressure ulcer of unspecified site, unspecified stage: Secondary | ICD-10-CM | POA: Diagnosis not present

## 2017-01-23 DIAGNOSIS — R21 Rash and other nonspecific skin eruption: Secondary | ICD-10-CM | POA: Diagnosis not present

## 2017-01-23 DIAGNOSIS — I110 Hypertensive heart disease with heart failure: Secondary | ICD-10-CM | POA: Diagnosis not present

## 2017-01-23 DIAGNOSIS — L8942 Pressure ulcer of contiguous site of back, buttock and hip, stage 2: Secondary | ICD-10-CM | POA: Diagnosis not present

## 2017-01-23 DIAGNOSIS — I2510 Atherosclerotic heart disease of native coronary artery without angina pectoris: Secondary | ICD-10-CM | POA: Diagnosis not present

## 2017-01-23 DIAGNOSIS — E1165 Type 2 diabetes mellitus with hyperglycemia: Secondary | ICD-10-CM | POA: Diagnosis not present

## 2017-01-23 DIAGNOSIS — F5104 Psychophysiologic insomnia: Secondary | ICD-10-CM | POA: Diagnosis not present

## 2017-01-23 DIAGNOSIS — I5032 Chronic diastolic (congestive) heart failure: Secondary | ICD-10-CM | POA: Diagnosis not present

## 2017-01-23 DIAGNOSIS — F0151 Vascular dementia with behavioral disturbance: Secondary | ICD-10-CM | POA: Diagnosis not present

## 2017-01-25 DIAGNOSIS — B952 Enterococcus as the cause of diseases classified elsewhere: Secondary | ICD-10-CM | POA: Diagnosis not present

## 2017-01-25 DIAGNOSIS — R7881 Bacteremia: Secondary | ICD-10-CM | POA: Diagnosis not present

## 2017-02-07 DIAGNOSIS — E1165 Type 2 diabetes mellitus with hyperglycemia: Secondary | ICD-10-CM | POA: Diagnosis not present

## 2017-02-07 DIAGNOSIS — L8942 Pressure ulcer of contiguous site of back, buttock and hip, stage 2: Secondary | ICD-10-CM | POA: Diagnosis not present

## 2017-02-07 DIAGNOSIS — I2510 Atherosclerotic heart disease of native coronary artery without angina pectoris: Secondary | ICD-10-CM | POA: Diagnosis not present

## 2017-02-07 DIAGNOSIS — I69351 Hemiplegia and hemiparesis following cerebral infarction affecting right dominant side: Secondary | ICD-10-CM | POA: Diagnosis not present

## 2017-02-07 DIAGNOSIS — I5032 Chronic diastolic (congestive) heart failure: Secondary | ICD-10-CM | POA: Diagnosis not present

## 2017-02-07 DIAGNOSIS — I110 Hypertensive heart disease with heart failure: Secondary | ICD-10-CM | POA: Diagnosis not present

## 2017-02-21 DIAGNOSIS — F09 Unspecified mental disorder due to known physiological condition: Secondary | ICD-10-CM | POA: Diagnosis not present

## 2017-02-21 DIAGNOSIS — L8942 Pressure ulcer of contiguous site of back, buttock and hip, stage 2: Secondary | ICD-10-CM | POA: Diagnosis not present

## 2017-02-21 DIAGNOSIS — F5104 Psychophysiologic insomnia: Secondary | ICD-10-CM | POA: Diagnosis not present

## 2017-02-21 DIAGNOSIS — I110 Hypertensive heart disease with heart failure: Secondary | ICD-10-CM | POA: Diagnosis not present

## 2017-02-21 DIAGNOSIS — L8990 Pressure ulcer of unspecified site, unspecified stage: Secondary | ICD-10-CM | POA: Diagnosis not present

## 2017-02-21 DIAGNOSIS — I69351 Hemiplegia and hemiparesis following cerebral infarction affecting right dominant side: Secondary | ICD-10-CM | POA: Diagnosis not present

## 2017-02-21 DIAGNOSIS — E1165 Type 2 diabetes mellitus with hyperglycemia: Secondary | ICD-10-CM | POA: Diagnosis not present

## 2017-02-21 DIAGNOSIS — I5032 Chronic diastolic (congestive) heart failure: Secondary | ICD-10-CM | POA: Diagnosis not present

## 2017-02-21 DIAGNOSIS — R339 Retention of urine, unspecified: Secondary | ICD-10-CM | POA: Diagnosis not present

## 2017-02-21 DIAGNOSIS — I2510 Atherosclerotic heart disease of native coronary artery without angina pectoris: Secondary | ICD-10-CM | POA: Diagnosis not present

## 2017-02-22 DIAGNOSIS — I69951 Hemiplegia and hemiparesis following unspecified cerebrovascular disease affecting right dominant side: Secondary | ICD-10-CM | POA: Diagnosis not present

## 2017-02-22 DIAGNOSIS — I2510 Atherosclerotic heart disease of native coronary artery without angina pectoris: Secondary | ICD-10-CM | POA: Diagnosis not present

## 2017-02-22 DIAGNOSIS — I672 Cerebral atherosclerosis: Secondary | ICD-10-CM | POA: Diagnosis not present

## 2017-02-22 DIAGNOSIS — I10 Essential (primary) hypertension: Secondary | ICD-10-CM | POA: Diagnosis not present

## 2017-02-22 DIAGNOSIS — I679 Cerebrovascular disease, unspecified: Secondary | ICD-10-CM | POA: Diagnosis not present

## 2017-02-22 DIAGNOSIS — I739 Peripheral vascular disease, unspecified: Secondary | ICD-10-CM | POA: Diagnosis not present

## 2017-02-22 DIAGNOSIS — H409 Unspecified glaucoma: Secondary | ICD-10-CM | POA: Diagnosis not present

## 2017-02-22 DIAGNOSIS — F0150 Vascular dementia without behavioral disturbance: Secondary | ICD-10-CM | POA: Diagnosis not present

## 2017-02-22 DIAGNOSIS — E1159 Type 2 diabetes mellitus with other circulatory complications: Secondary | ICD-10-CM | POA: Diagnosis not present

## 2017-02-22 DIAGNOSIS — K219 Gastro-esophageal reflux disease without esophagitis: Secondary | ICD-10-CM | POA: Diagnosis not present

## 2017-02-23 DIAGNOSIS — I739 Peripheral vascular disease, unspecified: Secondary | ICD-10-CM | POA: Diagnosis not present

## 2017-02-23 DIAGNOSIS — F0150 Vascular dementia without behavioral disturbance: Secondary | ICD-10-CM | POA: Diagnosis not present

## 2017-02-23 DIAGNOSIS — I69951 Hemiplegia and hemiparesis following unspecified cerebrovascular disease affecting right dominant side: Secondary | ICD-10-CM | POA: Diagnosis not present

## 2017-02-23 DIAGNOSIS — I10 Essential (primary) hypertension: Secondary | ICD-10-CM | POA: Diagnosis not present

## 2017-02-23 DIAGNOSIS — I672 Cerebral atherosclerosis: Secondary | ICD-10-CM | POA: Diagnosis not present

## 2017-02-23 DIAGNOSIS — I679 Cerebrovascular disease, unspecified: Secondary | ICD-10-CM | POA: Diagnosis not present

## 2017-02-24 DIAGNOSIS — E1151 Type 2 diabetes mellitus with diabetic peripheral angiopathy without gangrene: Secondary | ICD-10-CM | POA: Diagnosis not present

## 2017-02-25 DIAGNOSIS — F0150 Vascular dementia without behavioral disturbance: Secondary | ICD-10-CM | POA: Diagnosis not present

## 2017-02-25 DIAGNOSIS — I69951 Hemiplegia and hemiparesis following unspecified cerebrovascular disease affecting right dominant side: Secondary | ICD-10-CM | POA: Diagnosis not present

## 2017-02-25 DIAGNOSIS — I739 Peripheral vascular disease, unspecified: Secondary | ICD-10-CM | POA: Diagnosis not present

## 2017-02-25 DIAGNOSIS — I672 Cerebral atherosclerosis: Secondary | ICD-10-CM | POA: Diagnosis not present

## 2017-02-25 DIAGNOSIS — I10 Essential (primary) hypertension: Secondary | ICD-10-CM | POA: Diagnosis not present

## 2017-02-25 DIAGNOSIS — I679 Cerebrovascular disease, unspecified: Secondary | ICD-10-CM | POA: Diagnosis not present

## 2017-02-26 DIAGNOSIS — I739 Peripheral vascular disease, unspecified: Secondary | ICD-10-CM | POA: Diagnosis not present

## 2017-02-26 DIAGNOSIS — I672 Cerebral atherosclerosis: Secondary | ICD-10-CM | POA: Diagnosis not present

## 2017-02-26 DIAGNOSIS — I10 Essential (primary) hypertension: Secondary | ICD-10-CM | POA: Diagnosis not present

## 2017-02-26 DIAGNOSIS — F0150 Vascular dementia without behavioral disturbance: Secondary | ICD-10-CM | POA: Diagnosis not present

## 2017-02-26 DIAGNOSIS — I679 Cerebrovascular disease, unspecified: Secondary | ICD-10-CM | POA: Diagnosis not present

## 2017-02-26 DIAGNOSIS — E1159 Type 2 diabetes mellitus with other circulatory complications: Secondary | ICD-10-CM | POA: Diagnosis not present

## 2017-02-26 DIAGNOSIS — K219 Gastro-esophageal reflux disease without esophagitis: Secondary | ICD-10-CM | POA: Diagnosis not present

## 2017-02-26 DIAGNOSIS — I69951 Hemiplegia and hemiparesis following unspecified cerebrovascular disease affecting right dominant side: Secondary | ICD-10-CM | POA: Diagnosis not present

## 2017-02-26 DIAGNOSIS — I2510 Atherosclerotic heart disease of native coronary artery without angina pectoris: Secondary | ICD-10-CM | POA: Diagnosis not present

## 2017-02-26 DIAGNOSIS — H409 Unspecified glaucoma: Secondary | ICD-10-CM | POA: Diagnosis not present

## 2017-02-27 DIAGNOSIS — I69951 Hemiplegia and hemiparesis following unspecified cerebrovascular disease affecting right dominant side: Secondary | ICD-10-CM | POA: Diagnosis not present

## 2017-02-27 DIAGNOSIS — I739 Peripheral vascular disease, unspecified: Secondary | ICD-10-CM | POA: Diagnosis not present

## 2017-02-27 DIAGNOSIS — I672 Cerebral atherosclerosis: Secondary | ICD-10-CM | POA: Diagnosis not present

## 2017-02-27 DIAGNOSIS — I679 Cerebrovascular disease, unspecified: Secondary | ICD-10-CM | POA: Diagnosis not present

## 2017-02-27 DIAGNOSIS — F0150 Vascular dementia without behavioral disturbance: Secondary | ICD-10-CM | POA: Diagnosis not present

## 2017-02-27 DIAGNOSIS — I10 Essential (primary) hypertension: Secondary | ICD-10-CM | POA: Diagnosis not present

## 2017-03-01 DIAGNOSIS — I739 Peripheral vascular disease, unspecified: Secondary | ICD-10-CM | POA: Diagnosis not present

## 2017-03-01 DIAGNOSIS — I672 Cerebral atherosclerosis: Secondary | ICD-10-CM | POA: Diagnosis not present

## 2017-03-01 DIAGNOSIS — I679 Cerebrovascular disease, unspecified: Secondary | ICD-10-CM | POA: Diagnosis not present

## 2017-03-01 DIAGNOSIS — I10 Essential (primary) hypertension: Secondary | ICD-10-CM | POA: Diagnosis not present

## 2017-03-01 DIAGNOSIS — F0150 Vascular dementia without behavioral disturbance: Secondary | ICD-10-CM | POA: Diagnosis not present

## 2017-03-01 DIAGNOSIS — I69951 Hemiplegia and hemiparesis following unspecified cerebrovascular disease affecting right dominant side: Secondary | ICD-10-CM | POA: Diagnosis not present

## 2017-03-04 DIAGNOSIS — I679 Cerebrovascular disease, unspecified: Secondary | ICD-10-CM | POA: Diagnosis not present

## 2017-03-04 DIAGNOSIS — F0150 Vascular dementia without behavioral disturbance: Secondary | ICD-10-CM | POA: Diagnosis not present

## 2017-03-04 DIAGNOSIS — I69951 Hemiplegia and hemiparesis following unspecified cerebrovascular disease affecting right dominant side: Secondary | ICD-10-CM | POA: Diagnosis not present

## 2017-03-04 DIAGNOSIS — I10 Essential (primary) hypertension: Secondary | ICD-10-CM | POA: Diagnosis not present

## 2017-03-04 DIAGNOSIS — I672 Cerebral atherosclerosis: Secondary | ICD-10-CM | POA: Diagnosis not present

## 2017-03-04 DIAGNOSIS — I739 Peripheral vascular disease, unspecified: Secondary | ICD-10-CM | POA: Diagnosis not present

## 2017-03-05 DIAGNOSIS — F0150 Vascular dementia without behavioral disturbance: Secondary | ICD-10-CM | POA: Diagnosis not present

## 2017-03-05 DIAGNOSIS — I679 Cerebrovascular disease, unspecified: Secondary | ICD-10-CM | POA: Diagnosis not present

## 2017-03-05 DIAGNOSIS — I69951 Hemiplegia and hemiparesis following unspecified cerebrovascular disease affecting right dominant side: Secondary | ICD-10-CM | POA: Diagnosis not present

## 2017-03-05 DIAGNOSIS — I10 Essential (primary) hypertension: Secondary | ICD-10-CM | POA: Diagnosis not present

## 2017-03-05 DIAGNOSIS — I672 Cerebral atherosclerosis: Secondary | ICD-10-CM | POA: Diagnosis not present

## 2017-03-05 DIAGNOSIS — I739 Peripheral vascular disease, unspecified: Secondary | ICD-10-CM | POA: Diagnosis not present

## 2017-03-06 DIAGNOSIS — I672 Cerebral atherosclerosis: Secondary | ICD-10-CM | POA: Diagnosis not present

## 2017-03-06 DIAGNOSIS — I739 Peripheral vascular disease, unspecified: Secondary | ICD-10-CM | POA: Diagnosis not present

## 2017-03-06 DIAGNOSIS — F0150 Vascular dementia without behavioral disturbance: Secondary | ICD-10-CM | POA: Diagnosis not present

## 2017-03-06 DIAGNOSIS — I10 Essential (primary) hypertension: Secondary | ICD-10-CM | POA: Diagnosis not present

## 2017-03-06 DIAGNOSIS — I679 Cerebrovascular disease, unspecified: Secondary | ICD-10-CM | POA: Diagnosis not present

## 2017-03-06 DIAGNOSIS — I69951 Hemiplegia and hemiparesis following unspecified cerebrovascular disease affecting right dominant side: Secondary | ICD-10-CM | POA: Diagnosis not present

## 2017-03-07 DIAGNOSIS — I69951 Hemiplegia and hemiparesis following unspecified cerebrovascular disease affecting right dominant side: Secondary | ICD-10-CM | POA: Diagnosis not present

## 2017-03-07 DIAGNOSIS — I10 Essential (primary) hypertension: Secondary | ICD-10-CM | POA: Diagnosis not present

## 2017-03-07 DIAGNOSIS — I672 Cerebral atherosclerosis: Secondary | ICD-10-CM | POA: Diagnosis not present

## 2017-03-07 DIAGNOSIS — F0150 Vascular dementia without behavioral disturbance: Secondary | ICD-10-CM | POA: Diagnosis not present

## 2017-03-07 DIAGNOSIS — I679 Cerebrovascular disease, unspecified: Secondary | ICD-10-CM | POA: Diagnosis not present

## 2017-03-07 DIAGNOSIS — I739 Peripheral vascular disease, unspecified: Secondary | ICD-10-CM | POA: Diagnosis not present

## 2017-03-08 DIAGNOSIS — I10 Essential (primary) hypertension: Secondary | ICD-10-CM | POA: Diagnosis not present

## 2017-03-08 DIAGNOSIS — I672 Cerebral atherosclerosis: Secondary | ICD-10-CM | POA: Diagnosis not present

## 2017-03-08 DIAGNOSIS — F0150 Vascular dementia without behavioral disturbance: Secondary | ICD-10-CM | POA: Diagnosis not present

## 2017-03-08 DIAGNOSIS — I739 Peripheral vascular disease, unspecified: Secondary | ICD-10-CM | POA: Diagnosis not present

## 2017-03-08 DIAGNOSIS — I679 Cerebrovascular disease, unspecified: Secondary | ICD-10-CM | POA: Diagnosis not present

## 2017-03-08 DIAGNOSIS — I69951 Hemiplegia and hemiparesis following unspecified cerebrovascular disease affecting right dominant side: Secondary | ICD-10-CM | POA: Diagnosis not present

## 2017-03-11 DIAGNOSIS — I10 Essential (primary) hypertension: Secondary | ICD-10-CM | POA: Diagnosis not present

## 2017-03-11 DIAGNOSIS — I679 Cerebrovascular disease, unspecified: Secondary | ICD-10-CM | POA: Diagnosis not present

## 2017-03-11 DIAGNOSIS — I739 Peripheral vascular disease, unspecified: Secondary | ICD-10-CM | POA: Diagnosis not present

## 2017-03-11 DIAGNOSIS — F0150 Vascular dementia without behavioral disturbance: Secondary | ICD-10-CM | POA: Diagnosis not present

## 2017-03-11 DIAGNOSIS — I69951 Hemiplegia and hemiparesis following unspecified cerebrovascular disease affecting right dominant side: Secondary | ICD-10-CM | POA: Diagnosis not present

## 2017-03-11 DIAGNOSIS — I672 Cerebral atherosclerosis: Secondary | ICD-10-CM | POA: Diagnosis not present

## 2017-03-12 DIAGNOSIS — F0150 Vascular dementia without behavioral disturbance: Secondary | ICD-10-CM | POA: Diagnosis not present

## 2017-03-12 DIAGNOSIS — I10 Essential (primary) hypertension: Secondary | ICD-10-CM | POA: Diagnosis not present

## 2017-03-12 DIAGNOSIS — I69951 Hemiplegia and hemiparesis following unspecified cerebrovascular disease affecting right dominant side: Secondary | ICD-10-CM | POA: Diagnosis not present

## 2017-03-12 DIAGNOSIS — I739 Peripheral vascular disease, unspecified: Secondary | ICD-10-CM | POA: Diagnosis not present

## 2017-03-12 DIAGNOSIS — I672 Cerebral atherosclerosis: Secondary | ICD-10-CM | POA: Diagnosis not present

## 2017-03-12 DIAGNOSIS — I679 Cerebrovascular disease, unspecified: Secondary | ICD-10-CM | POA: Diagnosis not present

## 2017-03-13 DIAGNOSIS — I10 Essential (primary) hypertension: Secondary | ICD-10-CM | POA: Diagnosis not present

## 2017-03-13 DIAGNOSIS — I739 Peripheral vascular disease, unspecified: Secondary | ICD-10-CM | POA: Diagnosis not present

## 2017-03-13 DIAGNOSIS — I69951 Hemiplegia and hemiparesis following unspecified cerebrovascular disease affecting right dominant side: Secondary | ICD-10-CM | POA: Diagnosis not present

## 2017-03-13 DIAGNOSIS — I672 Cerebral atherosclerosis: Secondary | ICD-10-CM | POA: Diagnosis not present

## 2017-03-13 DIAGNOSIS — I679 Cerebrovascular disease, unspecified: Secondary | ICD-10-CM | POA: Diagnosis not present

## 2017-03-13 DIAGNOSIS — F0150 Vascular dementia without behavioral disturbance: Secondary | ICD-10-CM | POA: Diagnosis not present

## 2017-03-14 DIAGNOSIS — I10 Essential (primary) hypertension: Secondary | ICD-10-CM | POA: Diagnosis not present

## 2017-03-14 DIAGNOSIS — I672 Cerebral atherosclerosis: Secondary | ICD-10-CM | POA: Diagnosis not present

## 2017-03-14 DIAGNOSIS — I69951 Hemiplegia and hemiparesis following unspecified cerebrovascular disease affecting right dominant side: Secondary | ICD-10-CM | POA: Diagnosis not present

## 2017-03-14 DIAGNOSIS — F0150 Vascular dementia without behavioral disturbance: Secondary | ICD-10-CM | POA: Diagnosis not present

## 2017-03-14 DIAGNOSIS — I679 Cerebrovascular disease, unspecified: Secondary | ICD-10-CM | POA: Diagnosis not present

## 2017-03-14 DIAGNOSIS — I739 Peripheral vascular disease, unspecified: Secondary | ICD-10-CM | POA: Diagnosis not present

## 2017-03-15 DIAGNOSIS — I672 Cerebral atherosclerosis: Secondary | ICD-10-CM | POA: Diagnosis not present

## 2017-03-15 DIAGNOSIS — I679 Cerebrovascular disease, unspecified: Secondary | ICD-10-CM | POA: Diagnosis not present

## 2017-03-15 DIAGNOSIS — F0150 Vascular dementia without behavioral disturbance: Secondary | ICD-10-CM | POA: Diagnosis not present

## 2017-03-15 DIAGNOSIS — I10 Essential (primary) hypertension: Secondary | ICD-10-CM | POA: Diagnosis not present

## 2017-03-15 DIAGNOSIS — I69951 Hemiplegia and hemiparesis following unspecified cerebrovascular disease affecting right dominant side: Secondary | ICD-10-CM | POA: Diagnosis not present

## 2017-03-15 DIAGNOSIS — I739 Peripheral vascular disease, unspecified: Secondary | ICD-10-CM | POA: Diagnosis not present

## 2017-03-17 DIAGNOSIS — F0150 Vascular dementia without behavioral disturbance: Secondary | ICD-10-CM | POA: Diagnosis not present

## 2017-03-17 DIAGNOSIS — I679 Cerebrovascular disease, unspecified: Secondary | ICD-10-CM | POA: Diagnosis not present

## 2017-03-17 DIAGNOSIS — I672 Cerebral atherosclerosis: Secondary | ICD-10-CM | POA: Diagnosis not present

## 2017-03-17 DIAGNOSIS — I739 Peripheral vascular disease, unspecified: Secondary | ICD-10-CM | POA: Diagnosis not present

## 2017-03-17 DIAGNOSIS — I10 Essential (primary) hypertension: Secondary | ICD-10-CM | POA: Diagnosis not present

## 2017-03-17 DIAGNOSIS — I69951 Hemiplegia and hemiparesis following unspecified cerebrovascular disease affecting right dominant side: Secondary | ICD-10-CM | POA: Diagnosis not present

## 2017-03-18 DIAGNOSIS — F0150 Vascular dementia without behavioral disturbance: Secondary | ICD-10-CM | POA: Diagnosis not present

## 2017-03-18 DIAGNOSIS — I10 Essential (primary) hypertension: Secondary | ICD-10-CM | POA: Diagnosis not present

## 2017-03-18 DIAGNOSIS — I672 Cerebral atherosclerosis: Secondary | ICD-10-CM | POA: Diagnosis not present

## 2017-03-18 DIAGNOSIS — I679 Cerebrovascular disease, unspecified: Secondary | ICD-10-CM | POA: Diagnosis not present

## 2017-03-18 DIAGNOSIS — I739 Peripheral vascular disease, unspecified: Secondary | ICD-10-CM | POA: Diagnosis not present

## 2017-03-18 DIAGNOSIS — I69951 Hemiplegia and hemiparesis following unspecified cerebrovascular disease affecting right dominant side: Secondary | ICD-10-CM | POA: Diagnosis not present

## 2017-03-19 DIAGNOSIS — I672 Cerebral atherosclerosis: Secondary | ICD-10-CM | POA: Diagnosis not present

## 2017-03-19 DIAGNOSIS — F0150 Vascular dementia without behavioral disturbance: Secondary | ICD-10-CM | POA: Diagnosis not present

## 2017-03-19 DIAGNOSIS — I69951 Hemiplegia and hemiparesis following unspecified cerebrovascular disease affecting right dominant side: Secondary | ICD-10-CM | POA: Diagnosis not present

## 2017-03-19 DIAGNOSIS — I679 Cerebrovascular disease, unspecified: Secondary | ICD-10-CM | POA: Diagnosis not present

## 2017-03-19 DIAGNOSIS — I10 Essential (primary) hypertension: Secondary | ICD-10-CM | POA: Diagnosis not present

## 2017-03-19 DIAGNOSIS — I739 Peripheral vascular disease, unspecified: Secondary | ICD-10-CM | POA: Diagnosis not present

## 2017-03-20 DIAGNOSIS — I679 Cerebrovascular disease, unspecified: Secondary | ICD-10-CM | POA: Diagnosis not present

## 2017-03-20 DIAGNOSIS — I69951 Hemiplegia and hemiparesis following unspecified cerebrovascular disease affecting right dominant side: Secondary | ICD-10-CM | POA: Diagnosis not present

## 2017-03-20 DIAGNOSIS — I739 Peripheral vascular disease, unspecified: Secondary | ICD-10-CM | POA: Diagnosis not present

## 2017-03-20 DIAGNOSIS — F0150 Vascular dementia without behavioral disturbance: Secondary | ICD-10-CM | POA: Diagnosis not present

## 2017-03-20 DIAGNOSIS — I10 Essential (primary) hypertension: Secondary | ICD-10-CM | POA: Diagnosis not present

## 2017-03-20 DIAGNOSIS — I672 Cerebral atherosclerosis: Secondary | ICD-10-CM | POA: Diagnosis not present

## 2017-03-22 DIAGNOSIS — I672 Cerebral atherosclerosis: Secondary | ICD-10-CM | POA: Diagnosis not present

## 2017-03-22 DIAGNOSIS — I10 Essential (primary) hypertension: Secondary | ICD-10-CM | POA: Diagnosis not present

## 2017-03-22 DIAGNOSIS — F0150 Vascular dementia without behavioral disturbance: Secondary | ICD-10-CM | POA: Diagnosis not present

## 2017-03-22 DIAGNOSIS — F0390 Unspecified dementia without behavioral disturbance: Secondary | ICD-10-CM | POA: Diagnosis not present

## 2017-03-22 DIAGNOSIS — F5104 Psychophysiologic insomnia: Secondary | ICD-10-CM | POA: Diagnosis not present

## 2017-03-22 DIAGNOSIS — I739 Peripheral vascular disease, unspecified: Secondary | ICD-10-CM | POA: Diagnosis not present

## 2017-03-22 DIAGNOSIS — I679 Cerebrovascular disease, unspecified: Secondary | ICD-10-CM | POA: Diagnosis not present

## 2017-03-22 DIAGNOSIS — E1165 Type 2 diabetes mellitus with hyperglycemia: Secondary | ICD-10-CM | POA: Diagnosis not present

## 2017-03-22 DIAGNOSIS — I69951 Hemiplegia and hemiparesis following unspecified cerebrovascular disease affecting right dominant side: Secondary | ICD-10-CM | POA: Diagnosis not present

## 2017-03-22 DIAGNOSIS — I5032 Chronic diastolic (congestive) heart failure: Secondary | ICD-10-CM | POA: Diagnosis not present

## 2017-03-25 DIAGNOSIS — F0150 Vascular dementia without behavioral disturbance: Secondary | ICD-10-CM | POA: Diagnosis not present

## 2017-03-25 DIAGNOSIS — I69951 Hemiplegia and hemiparesis following unspecified cerebrovascular disease affecting right dominant side: Secondary | ICD-10-CM | POA: Diagnosis not present

## 2017-03-25 DIAGNOSIS — I10 Essential (primary) hypertension: Secondary | ICD-10-CM | POA: Diagnosis not present

## 2017-03-25 DIAGNOSIS — I679 Cerebrovascular disease, unspecified: Secondary | ICD-10-CM | POA: Diagnosis not present

## 2017-03-25 DIAGNOSIS — I739 Peripheral vascular disease, unspecified: Secondary | ICD-10-CM | POA: Diagnosis not present

## 2017-03-25 DIAGNOSIS — I672 Cerebral atherosclerosis: Secondary | ICD-10-CM | POA: Diagnosis not present

## 2017-03-26 DIAGNOSIS — I679 Cerebrovascular disease, unspecified: Secondary | ICD-10-CM | POA: Diagnosis not present

## 2017-03-26 DIAGNOSIS — I739 Peripheral vascular disease, unspecified: Secondary | ICD-10-CM | POA: Diagnosis not present

## 2017-03-26 DIAGNOSIS — I672 Cerebral atherosclerosis: Secondary | ICD-10-CM | POA: Diagnosis not present

## 2017-03-26 DIAGNOSIS — I10 Essential (primary) hypertension: Secondary | ICD-10-CM | POA: Diagnosis not present

## 2017-03-26 DIAGNOSIS — I69951 Hemiplegia and hemiparesis following unspecified cerebrovascular disease affecting right dominant side: Secondary | ICD-10-CM | POA: Diagnosis not present

## 2017-03-26 DIAGNOSIS — F0150 Vascular dementia without behavioral disturbance: Secondary | ICD-10-CM | POA: Diagnosis not present

## 2017-03-27 DIAGNOSIS — I10 Essential (primary) hypertension: Secondary | ICD-10-CM | POA: Diagnosis not present

## 2017-03-27 DIAGNOSIS — I69951 Hemiplegia and hemiparesis following unspecified cerebrovascular disease affecting right dominant side: Secondary | ICD-10-CM | POA: Diagnosis not present

## 2017-03-27 DIAGNOSIS — I672 Cerebral atherosclerosis: Secondary | ICD-10-CM | POA: Diagnosis not present

## 2017-03-27 DIAGNOSIS — I679 Cerebrovascular disease, unspecified: Secondary | ICD-10-CM | POA: Diagnosis not present

## 2017-03-27 DIAGNOSIS — F0150 Vascular dementia without behavioral disturbance: Secondary | ICD-10-CM | POA: Diagnosis not present

## 2017-03-27 DIAGNOSIS — I739 Peripheral vascular disease, unspecified: Secondary | ICD-10-CM | POA: Diagnosis not present

## 2017-03-28 DIAGNOSIS — I739 Peripheral vascular disease, unspecified: Secondary | ICD-10-CM | POA: Diagnosis not present

## 2017-03-28 DIAGNOSIS — I672 Cerebral atherosclerosis: Secondary | ICD-10-CM | POA: Diagnosis not present

## 2017-03-28 DIAGNOSIS — F0150 Vascular dementia without behavioral disturbance: Secondary | ICD-10-CM | POA: Diagnosis not present

## 2017-03-28 DIAGNOSIS — I679 Cerebrovascular disease, unspecified: Secondary | ICD-10-CM | POA: Diagnosis not present

## 2017-03-28 DIAGNOSIS — I69951 Hemiplegia and hemiparesis following unspecified cerebrovascular disease affecting right dominant side: Secondary | ICD-10-CM | POA: Diagnosis not present

## 2017-03-28 DIAGNOSIS — I10 Essential (primary) hypertension: Secondary | ICD-10-CM | POA: Diagnosis not present

## 2017-03-29 DIAGNOSIS — I672 Cerebral atherosclerosis: Secondary | ICD-10-CM | POA: Diagnosis not present

## 2017-03-29 DIAGNOSIS — E1159 Type 2 diabetes mellitus with other circulatory complications: Secondary | ICD-10-CM | POA: Diagnosis not present

## 2017-03-29 DIAGNOSIS — I739 Peripheral vascular disease, unspecified: Secondary | ICD-10-CM | POA: Diagnosis not present

## 2017-03-29 DIAGNOSIS — K219 Gastro-esophageal reflux disease without esophagitis: Secondary | ICD-10-CM | POA: Diagnosis not present

## 2017-03-29 DIAGNOSIS — I69951 Hemiplegia and hemiparesis following unspecified cerebrovascular disease affecting right dominant side: Secondary | ICD-10-CM | POA: Diagnosis not present

## 2017-03-29 DIAGNOSIS — I679 Cerebrovascular disease, unspecified: Secondary | ICD-10-CM | POA: Diagnosis not present

## 2017-03-29 DIAGNOSIS — H409 Unspecified glaucoma: Secondary | ICD-10-CM | POA: Diagnosis not present

## 2017-03-29 DIAGNOSIS — I10 Essential (primary) hypertension: Secondary | ICD-10-CM | POA: Diagnosis not present

## 2017-03-29 DIAGNOSIS — F0150 Vascular dementia without behavioral disturbance: Secondary | ICD-10-CM | POA: Diagnosis not present

## 2017-03-29 DIAGNOSIS — I2510 Atherosclerotic heart disease of native coronary artery without angina pectoris: Secondary | ICD-10-CM | POA: Diagnosis not present

## 2017-04-01 DIAGNOSIS — I739 Peripheral vascular disease, unspecified: Secondary | ICD-10-CM | POA: Diagnosis not present

## 2017-04-01 DIAGNOSIS — I69951 Hemiplegia and hemiparesis following unspecified cerebrovascular disease affecting right dominant side: Secondary | ICD-10-CM | POA: Diagnosis not present

## 2017-04-01 DIAGNOSIS — F0150 Vascular dementia without behavioral disturbance: Secondary | ICD-10-CM | POA: Diagnosis not present

## 2017-04-01 DIAGNOSIS — I672 Cerebral atherosclerosis: Secondary | ICD-10-CM | POA: Diagnosis not present

## 2017-04-01 DIAGNOSIS — I10 Essential (primary) hypertension: Secondary | ICD-10-CM | POA: Diagnosis not present

## 2017-04-01 DIAGNOSIS — I679 Cerebrovascular disease, unspecified: Secondary | ICD-10-CM | POA: Diagnosis not present

## 2017-04-03 DIAGNOSIS — F0150 Vascular dementia without behavioral disturbance: Secondary | ICD-10-CM | POA: Diagnosis not present

## 2017-04-03 DIAGNOSIS — I10 Essential (primary) hypertension: Secondary | ICD-10-CM | POA: Diagnosis not present

## 2017-04-03 DIAGNOSIS — I672 Cerebral atherosclerosis: Secondary | ICD-10-CM | POA: Diagnosis not present

## 2017-04-03 DIAGNOSIS — I739 Peripheral vascular disease, unspecified: Secondary | ICD-10-CM | POA: Diagnosis not present

## 2017-04-03 DIAGNOSIS — I679 Cerebrovascular disease, unspecified: Secondary | ICD-10-CM | POA: Diagnosis not present

## 2017-04-03 DIAGNOSIS — I69951 Hemiplegia and hemiparesis following unspecified cerebrovascular disease affecting right dominant side: Secondary | ICD-10-CM | POA: Diagnosis not present

## 2017-04-04 DIAGNOSIS — I69951 Hemiplegia and hemiparesis following unspecified cerebrovascular disease affecting right dominant side: Secondary | ICD-10-CM | POA: Diagnosis not present

## 2017-04-04 DIAGNOSIS — I739 Peripheral vascular disease, unspecified: Secondary | ICD-10-CM | POA: Diagnosis not present

## 2017-04-04 DIAGNOSIS — I679 Cerebrovascular disease, unspecified: Secondary | ICD-10-CM | POA: Diagnosis not present

## 2017-04-04 DIAGNOSIS — F0150 Vascular dementia without behavioral disturbance: Secondary | ICD-10-CM | POA: Diagnosis not present

## 2017-04-04 DIAGNOSIS — I672 Cerebral atherosclerosis: Secondary | ICD-10-CM | POA: Diagnosis not present

## 2017-04-04 DIAGNOSIS — I10 Essential (primary) hypertension: Secondary | ICD-10-CM | POA: Diagnosis not present

## 2017-04-05 DIAGNOSIS — I679 Cerebrovascular disease, unspecified: Secondary | ICD-10-CM | POA: Diagnosis not present

## 2017-04-05 DIAGNOSIS — I672 Cerebral atherosclerosis: Secondary | ICD-10-CM | POA: Diagnosis not present

## 2017-04-05 DIAGNOSIS — I739 Peripheral vascular disease, unspecified: Secondary | ICD-10-CM | POA: Diagnosis not present

## 2017-04-05 DIAGNOSIS — I69951 Hemiplegia and hemiparesis following unspecified cerebrovascular disease affecting right dominant side: Secondary | ICD-10-CM | POA: Diagnosis not present

## 2017-04-05 DIAGNOSIS — I10 Essential (primary) hypertension: Secondary | ICD-10-CM | POA: Diagnosis not present

## 2017-04-05 DIAGNOSIS — F0150 Vascular dementia without behavioral disturbance: Secondary | ICD-10-CM | POA: Diagnosis not present

## 2017-04-08 DIAGNOSIS — I69951 Hemiplegia and hemiparesis following unspecified cerebrovascular disease affecting right dominant side: Secondary | ICD-10-CM | POA: Diagnosis not present

## 2017-04-08 DIAGNOSIS — I10 Essential (primary) hypertension: Secondary | ICD-10-CM | POA: Diagnosis not present

## 2017-04-08 DIAGNOSIS — I672 Cerebral atherosclerosis: Secondary | ICD-10-CM | POA: Diagnosis not present

## 2017-04-08 DIAGNOSIS — I679 Cerebrovascular disease, unspecified: Secondary | ICD-10-CM | POA: Diagnosis not present

## 2017-04-08 DIAGNOSIS — F0150 Vascular dementia without behavioral disturbance: Secondary | ICD-10-CM | POA: Diagnosis not present

## 2017-04-08 DIAGNOSIS — I739 Peripheral vascular disease, unspecified: Secondary | ICD-10-CM | POA: Diagnosis not present

## 2017-04-09 DIAGNOSIS — F0150 Vascular dementia without behavioral disturbance: Secondary | ICD-10-CM | POA: Diagnosis not present

## 2017-04-09 DIAGNOSIS — I672 Cerebral atherosclerosis: Secondary | ICD-10-CM | POA: Diagnosis not present

## 2017-04-09 DIAGNOSIS — I10 Essential (primary) hypertension: Secondary | ICD-10-CM | POA: Diagnosis not present

## 2017-04-09 DIAGNOSIS — I69951 Hemiplegia and hemiparesis following unspecified cerebrovascular disease affecting right dominant side: Secondary | ICD-10-CM | POA: Diagnosis not present

## 2017-04-09 DIAGNOSIS — I679 Cerebrovascular disease, unspecified: Secondary | ICD-10-CM | POA: Diagnosis not present

## 2017-04-09 DIAGNOSIS — I739 Peripheral vascular disease, unspecified: Secondary | ICD-10-CM | POA: Diagnosis not present

## 2017-04-10 DIAGNOSIS — I679 Cerebrovascular disease, unspecified: Secondary | ICD-10-CM | POA: Diagnosis not present

## 2017-04-10 DIAGNOSIS — F0150 Vascular dementia without behavioral disturbance: Secondary | ICD-10-CM | POA: Diagnosis not present

## 2017-04-10 DIAGNOSIS — I739 Peripheral vascular disease, unspecified: Secondary | ICD-10-CM | POA: Diagnosis not present

## 2017-04-10 DIAGNOSIS — I10 Essential (primary) hypertension: Secondary | ICD-10-CM | POA: Diagnosis not present

## 2017-04-10 DIAGNOSIS — I69951 Hemiplegia and hemiparesis following unspecified cerebrovascular disease affecting right dominant side: Secondary | ICD-10-CM | POA: Diagnosis not present

## 2017-04-10 DIAGNOSIS — I672 Cerebral atherosclerosis: Secondary | ICD-10-CM | POA: Diagnosis not present

## 2017-04-11 DIAGNOSIS — I679 Cerebrovascular disease, unspecified: Secondary | ICD-10-CM | POA: Diagnosis not present

## 2017-04-11 DIAGNOSIS — I69951 Hemiplegia and hemiparesis following unspecified cerebrovascular disease affecting right dominant side: Secondary | ICD-10-CM | POA: Diagnosis not present

## 2017-04-11 DIAGNOSIS — I739 Peripheral vascular disease, unspecified: Secondary | ICD-10-CM | POA: Diagnosis not present

## 2017-04-11 DIAGNOSIS — F0150 Vascular dementia without behavioral disturbance: Secondary | ICD-10-CM | POA: Diagnosis not present

## 2017-04-11 DIAGNOSIS — I672 Cerebral atherosclerosis: Secondary | ICD-10-CM | POA: Diagnosis not present

## 2017-04-11 DIAGNOSIS — I10 Essential (primary) hypertension: Secondary | ICD-10-CM | POA: Diagnosis not present

## 2017-04-12 DIAGNOSIS — I69951 Hemiplegia and hemiparesis following unspecified cerebrovascular disease affecting right dominant side: Secondary | ICD-10-CM | POA: Diagnosis not present

## 2017-04-12 DIAGNOSIS — I10 Essential (primary) hypertension: Secondary | ICD-10-CM | POA: Diagnosis not present

## 2017-04-12 DIAGNOSIS — F0150 Vascular dementia without behavioral disturbance: Secondary | ICD-10-CM | POA: Diagnosis not present

## 2017-04-12 DIAGNOSIS — I739 Peripheral vascular disease, unspecified: Secondary | ICD-10-CM | POA: Diagnosis not present

## 2017-04-12 DIAGNOSIS — I679 Cerebrovascular disease, unspecified: Secondary | ICD-10-CM | POA: Diagnosis not present

## 2017-04-12 DIAGNOSIS — I672 Cerebral atherosclerosis: Secondary | ICD-10-CM | POA: Diagnosis not present

## 2017-04-15 DIAGNOSIS — I69951 Hemiplegia and hemiparesis following unspecified cerebrovascular disease affecting right dominant side: Secondary | ICD-10-CM | POA: Diagnosis not present

## 2017-04-15 DIAGNOSIS — I679 Cerebrovascular disease, unspecified: Secondary | ICD-10-CM | POA: Diagnosis not present

## 2017-04-15 DIAGNOSIS — F0150 Vascular dementia without behavioral disturbance: Secondary | ICD-10-CM | POA: Diagnosis not present

## 2017-04-15 DIAGNOSIS — I739 Peripheral vascular disease, unspecified: Secondary | ICD-10-CM | POA: Diagnosis not present

## 2017-04-15 DIAGNOSIS — I672 Cerebral atherosclerosis: Secondary | ICD-10-CM | POA: Diagnosis not present

## 2017-04-15 DIAGNOSIS — I10 Essential (primary) hypertension: Secondary | ICD-10-CM | POA: Diagnosis not present

## 2017-04-16 DIAGNOSIS — I10 Essential (primary) hypertension: Secondary | ICD-10-CM | POA: Diagnosis not present

## 2017-04-16 DIAGNOSIS — I739 Peripheral vascular disease, unspecified: Secondary | ICD-10-CM | POA: Diagnosis not present

## 2017-04-16 DIAGNOSIS — F0150 Vascular dementia without behavioral disturbance: Secondary | ICD-10-CM | POA: Diagnosis not present

## 2017-04-16 DIAGNOSIS — I679 Cerebrovascular disease, unspecified: Secondary | ICD-10-CM | POA: Diagnosis not present

## 2017-04-16 DIAGNOSIS — I69951 Hemiplegia and hemiparesis following unspecified cerebrovascular disease affecting right dominant side: Secondary | ICD-10-CM | POA: Diagnosis not present

## 2017-04-16 DIAGNOSIS — I672 Cerebral atherosclerosis: Secondary | ICD-10-CM | POA: Diagnosis not present

## 2017-04-17 DIAGNOSIS — I739 Peripheral vascular disease, unspecified: Secondary | ICD-10-CM | POA: Diagnosis not present

## 2017-04-17 DIAGNOSIS — I679 Cerebrovascular disease, unspecified: Secondary | ICD-10-CM | POA: Diagnosis not present

## 2017-04-17 DIAGNOSIS — F0150 Vascular dementia without behavioral disturbance: Secondary | ICD-10-CM | POA: Diagnosis not present

## 2017-04-17 DIAGNOSIS — I672 Cerebral atherosclerosis: Secondary | ICD-10-CM | POA: Diagnosis not present

## 2017-04-17 DIAGNOSIS — I10 Essential (primary) hypertension: Secondary | ICD-10-CM | POA: Diagnosis not present

## 2017-04-17 DIAGNOSIS — I69951 Hemiplegia and hemiparesis following unspecified cerebrovascular disease affecting right dominant side: Secondary | ICD-10-CM | POA: Diagnosis not present

## 2017-04-19 DIAGNOSIS — I69951 Hemiplegia and hemiparesis following unspecified cerebrovascular disease affecting right dominant side: Secondary | ICD-10-CM | POA: Diagnosis not present

## 2017-04-19 DIAGNOSIS — I672 Cerebral atherosclerosis: Secondary | ICD-10-CM | POA: Diagnosis not present

## 2017-04-19 DIAGNOSIS — I10 Essential (primary) hypertension: Secondary | ICD-10-CM | POA: Diagnosis not present

## 2017-04-19 DIAGNOSIS — I739 Peripheral vascular disease, unspecified: Secondary | ICD-10-CM | POA: Diagnosis not present

## 2017-04-19 DIAGNOSIS — I679 Cerebrovascular disease, unspecified: Secondary | ICD-10-CM | POA: Diagnosis not present

## 2017-04-19 DIAGNOSIS — F0150 Vascular dementia without behavioral disturbance: Secondary | ICD-10-CM | POA: Diagnosis not present

## 2017-04-22 DIAGNOSIS — I69951 Hemiplegia and hemiparesis following unspecified cerebrovascular disease affecting right dominant side: Secondary | ICD-10-CM | POA: Diagnosis not present

## 2017-04-22 DIAGNOSIS — I672 Cerebral atherosclerosis: Secondary | ICD-10-CM | POA: Diagnosis not present

## 2017-04-22 DIAGNOSIS — I10 Essential (primary) hypertension: Secondary | ICD-10-CM | POA: Diagnosis not present

## 2017-04-22 DIAGNOSIS — F0150 Vascular dementia without behavioral disturbance: Secondary | ICD-10-CM | POA: Diagnosis not present

## 2017-04-22 DIAGNOSIS — I679 Cerebrovascular disease, unspecified: Secondary | ICD-10-CM | POA: Diagnosis not present

## 2017-04-22 DIAGNOSIS — I739 Peripheral vascular disease, unspecified: Secondary | ICD-10-CM | POA: Diagnosis not present

## 2017-04-23 DIAGNOSIS — I739 Peripheral vascular disease, unspecified: Secondary | ICD-10-CM | POA: Diagnosis not present

## 2017-04-23 DIAGNOSIS — I10 Essential (primary) hypertension: Secondary | ICD-10-CM | POA: Diagnosis not present

## 2017-04-23 DIAGNOSIS — I679 Cerebrovascular disease, unspecified: Secondary | ICD-10-CM | POA: Diagnosis not present

## 2017-04-23 DIAGNOSIS — I69951 Hemiplegia and hemiparesis following unspecified cerebrovascular disease affecting right dominant side: Secondary | ICD-10-CM | POA: Diagnosis not present

## 2017-04-23 DIAGNOSIS — F0150 Vascular dementia without behavioral disturbance: Secondary | ICD-10-CM | POA: Diagnosis not present

## 2017-04-23 DIAGNOSIS — I672 Cerebral atherosclerosis: Secondary | ICD-10-CM | POA: Diagnosis not present

## 2017-04-24 DIAGNOSIS — I679 Cerebrovascular disease, unspecified: Secondary | ICD-10-CM | POA: Diagnosis not present

## 2017-04-24 DIAGNOSIS — I739 Peripheral vascular disease, unspecified: Secondary | ICD-10-CM | POA: Diagnosis not present

## 2017-04-24 DIAGNOSIS — I10 Essential (primary) hypertension: Secondary | ICD-10-CM | POA: Diagnosis not present

## 2017-04-24 DIAGNOSIS — I672 Cerebral atherosclerosis: Secondary | ICD-10-CM | POA: Diagnosis not present

## 2017-04-24 DIAGNOSIS — I69951 Hemiplegia and hemiparesis following unspecified cerebrovascular disease affecting right dominant side: Secondary | ICD-10-CM | POA: Diagnosis not present

## 2017-04-24 DIAGNOSIS — F0150 Vascular dementia without behavioral disturbance: Secondary | ICD-10-CM | POA: Diagnosis not present

## 2017-04-25 DIAGNOSIS — I679 Cerebrovascular disease, unspecified: Secondary | ICD-10-CM | POA: Diagnosis not present

## 2017-04-25 DIAGNOSIS — I672 Cerebral atherosclerosis: Secondary | ICD-10-CM | POA: Diagnosis not present

## 2017-04-25 DIAGNOSIS — I69951 Hemiplegia and hemiparesis following unspecified cerebrovascular disease affecting right dominant side: Secondary | ICD-10-CM | POA: Diagnosis not present

## 2017-04-25 DIAGNOSIS — I10 Essential (primary) hypertension: Secondary | ICD-10-CM | POA: Diagnosis not present

## 2017-04-25 DIAGNOSIS — F0150 Vascular dementia without behavioral disturbance: Secondary | ICD-10-CM | POA: Diagnosis not present

## 2017-04-25 DIAGNOSIS — I739 Peripheral vascular disease, unspecified: Secondary | ICD-10-CM | POA: Diagnosis not present

## 2017-04-26 DIAGNOSIS — I2510 Atherosclerotic heart disease of native coronary artery without angina pectoris: Secondary | ICD-10-CM | POA: Diagnosis not present

## 2017-04-26 DIAGNOSIS — I672 Cerebral atherosclerosis: Secondary | ICD-10-CM | POA: Diagnosis not present

## 2017-04-26 DIAGNOSIS — I69951 Hemiplegia and hemiparesis following unspecified cerebrovascular disease affecting right dominant side: Secondary | ICD-10-CM | POA: Diagnosis not present

## 2017-04-26 DIAGNOSIS — F0150 Vascular dementia without behavioral disturbance: Secondary | ICD-10-CM | POA: Diagnosis not present

## 2017-04-26 DIAGNOSIS — K219 Gastro-esophageal reflux disease without esophagitis: Secondary | ICD-10-CM | POA: Diagnosis not present

## 2017-04-26 DIAGNOSIS — I10 Essential (primary) hypertension: Secondary | ICD-10-CM | POA: Diagnosis not present

## 2017-04-26 DIAGNOSIS — E1159 Type 2 diabetes mellitus with other circulatory complications: Secondary | ICD-10-CM | POA: Diagnosis not present

## 2017-04-26 DIAGNOSIS — H409 Unspecified glaucoma: Secondary | ICD-10-CM | POA: Diagnosis not present

## 2017-04-26 DIAGNOSIS — I739 Peripheral vascular disease, unspecified: Secondary | ICD-10-CM | POA: Diagnosis not present

## 2017-04-26 DIAGNOSIS — I679 Cerebrovascular disease, unspecified: Secondary | ICD-10-CM | POA: Diagnosis not present

## 2017-04-29 DIAGNOSIS — I672 Cerebral atherosclerosis: Secondary | ICD-10-CM | POA: Diagnosis not present

## 2017-04-29 DIAGNOSIS — I739 Peripheral vascular disease, unspecified: Secondary | ICD-10-CM | POA: Diagnosis not present

## 2017-04-29 DIAGNOSIS — I679 Cerebrovascular disease, unspecified: Secondary | ICD-10-CM | POA: Diagnosis not present

## 2017-04-29 DIAGNOSIS — F0150 Vascular dementia without behavioral disturbance: Secondary | ICD-10-CM | POA: Diagnosis not present

## 2017-04-29 DIAGNOSIS — I10 Essential (primary) hypertension: Secondary | ICD-10-CM | POA: Diagnosis not present

## 2017-04-29 DIAGNOSIS — I69951 Hemiplegia and hemiparesis following unspecified cerebrovascular disease affecting right dominant side: Secondary | ICD-10-CM | POA: Diagnosis not present

## 2017-04-30 DIAGNOSIS — F0150 Vascular dementia without behavioral disturbance: Secondary | ICD-10-CM | POA: Diagnosis not present

## 2017-04-30 DIAGNOSIS — I679 Cerebrovascular disease, unspecified: Secondary | ICD-10-CM | POA: Diagnosis not present

## 2017-04-30 DIAGNOSIS — I69951 Hemiplegia and hemiparesis following unspecified cerebrovascular disease affecting right dominant side: Secondary | ICD-10-CM | POA: Diagnosis not present

## 2017-04-30 DIAGNOSIS — I739 Peripheral vascular disease, unspecified: Secondary | ICD-10-CM | POA: Diagnosis not present

## 2017-04-30 DIAGNOSIS — I672 Cerebral atherosclerosis: Secondary | ICD-10-CM | POA: Diagnosis not present

## 2017-04-30 DIAGNOSIS — I10 Essential (primary) hypertension: Secondary | ICD-10-CM | POA: Diagnosis not present

## 2017-05-01 DIAGNOSIS — F0150 Vascular dementia without behavioral disturbance: Secondary | ICD-10-CM | POA: Diagnosis not present

## 2017-05-01 DIAGNOSIS — I69951 Hemiplegia and hemiparesis following unspecified cerebrovascular disease affecting right dominant side: Secondary | ICD-10-CM | POA: Diagnosis not present

## 2017-05-01 DIAGNOSIS — I672 Cerebral atherosclerosis: Secondary | ICD-10-CM | POA: Diagnosis not present

## 2017-05-01 DIAGNOSIS — I679 Cerebrovascular disease, unspecified: Secondary | ICD-10-CM | POA: Diagnosis not present

## 2017-05-01 DIAGNOSIS — I739 Peripheral vascular disease, unspecified: Secondary | ICD-10-CM | POA: Diagnosis not present

## 2017-05-01 DIAGNOSIS — I10 Essential (primary) hypertension: Secondary | ICD-10-CM | POA: Diagnosis not present

## 2017-05-03 DIAGNOSIS — I10 Essential (primary) hypertension: Secondary | ICD-10-CM | POA: Diagnosis not present

## 2017-05-03 DIAGNOSIS — I69951 Hemiplegia and hemiparesis following unspecified cerebrovascular disease affecting right dominant side: Secondary | ICD-10-CM | POA: Diagnosis not present

## 2017-05-03 DIAGNOSIS — I739 Peripheral vascular disease, unspecified: Secondary | ICD-10-CM | POA: Diagnosis not present

## 2017-05-03 DIAGNOSIS — F0150 Vascular dementia without behavioral disturbance: Secondary | ICD-10-CM | POA: Diagnosis not present

## 2017-05-03 DIAGNOSIS — I672 Cerebral atherosclerosis: Secondary | ICD-10-CM | POA: Diagnosis not present

## 2017-05-03 DIAGNOSIS — I679 Cerebrovascular disease, unspecified: Secondary | ICD-10-CM | POA: Diagnosis not present

## 2017-05-06 DIAGNOSIS — I69951 Hemiplegia and hemiparesis following unspecified cerebrovascular disease affecting right dominant side: Secondary | ICD-10-CM | POA: Diagnosis not present

## 2017-05-06 DIAGNOSIS — I10 Essential (primary) hypertension: Secondary | ICD-10-CM | POA: Diagnosis not present

## 2017-05-06 DIAGNOSIS — F0150 Vascular dementia without behavioral disturbance: Secondary | ICD-10-CM | POA: Diagnosis not present

## 2017-05-06 DIAGNOSIS — I679 Cerebrovascular disease, unspecified: Secondary | ICD-10-CM | POA: Diagnosis not present

## 2017-05-06 DIAGNOSIS — I739 Peripheral vascular disease, unspecified: Secondary | ICD-10-CM | POA: Diagnosis not present

## 2017-05-06 DIAGNOSIS — I672 Cerebral atherosclerosis: Secondary | ICD-10-CM | POA: Diagnosis not present

## 2017-05-07 DIAGNOSIS — I679 Cerebrovascular disease, unspecified: Secondary | ICD-10-CM | POA: Diagnosis not present

## 2017-05-07 DIAGNOSIS — I69951 Hemiplegia and hemiparesis following unspecified cerebrovascular disease affecting right dominant side: Secondary | ICD-10-CM | POA: Diagnosis not present

## 2017-05-07 DIAGNOSIS — I10 Essential (primary) hypertension: Secondary | ICD-10-CM | POA: Diagnosis not present

## 2017-05-07 DIAGNOSIS — F0150 Vascular dementia without behavioral disturbance: Secondary | ICD-10-CM | POA: Diagnosis not present

## 2017-05-07 DIAGNOSIS — I672 Cerebral atherosclerosis: Secondary | ICD-10-CM | POA: Diagnosis not present

## 2017-05-07 DIAGNOSIS — I739 Peripheral vascular disease, unspecified: Secondary | ICD-10-CM | POA: Diagnosis not present

## 2017-05-08 DIAGNOSIS — I739 Peripheral vascular disease, unspecified: Secondary | ICD-10-CM | POA: Diagnosis not present

## 2017-05-08 DIAGNOSIS — I69951 Hemiplegia and hemiparesis following unspecified cerebrovascular disease affecting right dominant side: Secondary | ICD-10-CM | POA: Diagnosis not present

## 2017-05-08 DIAGNOSIS — I10 Essential (primary) hypertension: Secondary | ICD-10-CM | POA: Diagnosis not present

## 2017-05-08 DIAGNOSIS — F0150 Vascular dementia without behavioral disturbance: Secondary | ICD-10-CM | POA: Diagnosis not present

## 2017-05-08 DIAGNOSIS — I679 Cerebrovascular disease, unspecified: Secondary | ICD-10-CM | POA: Diagnosis not present

## 2017-05-08 DIAGNOSIS — I672 Cerebral atherosclerosis: Secondary | ICD-10-CM | POA: Diagnosis not present

## 2017-05-09 DIAGNOSIS — I69951 Hemiplegia and hemiparesis following unspecified cerebrovascular disease affecting right dominant side: Secondary | ICD-10-CM | POA: Diagnosis not present

## 2017-05-09 DIAGNOSIS — I672 Cerebral atherosclerosis: Secondary | ICD-10-CM | POA: Diagnosis not present

## 2017-05-09 DIAGNOSIS — I679 Cerebrovascular disease, unspecified: Secondary | ICD-10-CM | POA: Diagnosis not present

## 2017-05-09 DIAGNOSIS — I10 Essential (primary) hypertension: Secondary | ICD-10-CM | POA: Diagnosis not present

## 2017-05-09 DIAGNOSIS — I739 Peripheral vascular disease, unspecified: Secondary | ICD-10-CM | POA: Diagnosis not present

## 2017-05-09 DIAGNOSIS — F0150 Vascular dementia without behavioral disturbance: Secondary | ICD-10-CM | POA: Diagnosis not present

## 2017-05-10 DIAGNOSIS — F0150 Vascular dementia without behavioral disturbance: Secondary | ICD-10-CM | POA: Diagnosis not present

## 2017-05-10 DIAGNOSIS — I69951 Hemiplegia and hemiparesis following unspecified cerebrovascular disease affecting right dominant side: Secondary | ICD-10-CM | POA: Diagnosis not present

## 2017-05-10 DIAGNOSIS — I10 Essential (primary) hypertension: Secondary | ICD-10-CM | POA: Diagnosis not present

## 2017-05-10 DIAGNOSIS — I672 Cerebral atherosclerosis: Secondary | ICD-10-CM | POA: Diagnosis not present

## 2017-05-10 DIAGNOSIS — I679 Cerebrovascular disease, unspecified: Secondary | ICD-10-CM | POA: Diagnosis not present

## 2017-05-10 DIAGNOSIS — I739 Peripheral vascular disease, unspecified: Secondary | ICD-10-CM | POA: Diagnosis not present

## 2017-05-13 DIAGNOSIS — I10 Essential (primary) hypertension: Secondary | ICD-10-CM | POA: Diagnosis not present

## 2017-05-13 DIAGNOSIS — F0150 Vascular dementia without behavioral disturbance: Secondary | ICD-10-CM | POA: Diagnosis not present

## 2017-05-13 DIAGNOSIS — I69951 Hemiplegia and hemiparesis following unspecified cerebrovascular disease affecting right dominant side: Secondary | ICD-10-CM | POA: Diagnosis not present

## 2017-05-13 DIAGNOSIS — I672 Cerebral atherosclerosis: Secondary | ICD-10-CM | POA: Diagnosis not present

## 2017-05-13 DIAGNOSIS — I679 Cerebrovascular disease, unspecified: Secondary | ICD-10-CM | POA: Diagnosis not present

## 2017-05-13 DIAGNOSIS — I739 Peripheral vascular disease, unspecified: Secondary | ICD-10-CM | POA: Diagnosis not present

## 2017-05-14 DIAGNOSIS — F0150 Vascular dementia without behavioral disturbance: Secondary | ICD-10-CM | POA: Diagnosis not present

## 2017-05-14 DIAGNOSIS — I10 Essential (primary) hypertension: Secondary | ICD-10-CM | POA: Diagnosis not present

## 2017-05-14 DIAGNOSIS — I672 Cerebral atherosclerosis: Secondary | ICD-10-CM | POA: Diagnosis not present

## 2017-05-14 DIAGNOSIS — I739 Peripheral vascular disease, unspecified: Secondary | ICD-10-CM | POA: Diagnosis not present

## 2017-05-14 DIAGNOSIS — I679 Cerebrovascular disease, unspecified: Secondary | ICD-10-CM | POA: Diagnosis not present

## 2017-05-14 DIAGNOSIS — I69951 Hemiplegia and hemiparesis following unspecified cerebrovascular disease affecting right dominant side: Secondary | ICD-10-CM | POA: Diagnosis not present

## 2017-05-15 DIAGNOSIS — I69951 Hemiplegia and hemiparesis following unspecified cerebrovascular disease affecting right dominant side: Secondary | ICD-10-CM | POA: Diagnosis not present

## 2017-05-15 DIAGNOSIS — I739 Peripheral vascular disease, unspecified: Secondary | ICD-10-CM | POA: Diagnosis not present

## 2017-05-15 DIAGNOSIS — I10 Essential (primary) hypertension: Secondary | ICD-10-CM | POA: Diagnosis not present

## 2017-05-15 DIAGNOSIS — F0150 Vascular dementia without behavioral disturbance: Secondary | ICD-10-CM | POA: Diagnosis not present

## 2017-05-15 DIAGNOSIS — I672 Cerebral atherosclerosis: Secondary | ICD-10-CM | POA: Diagnosis not present

## 2017-05-15 DIAGNOSIS — I679 Cerebrovascular disease, unspecified: Secondary | ICD-10-CM | POA: Diagnosis not present

## 2017-05-17 ENCOUNTER — Inpatient Hospital Stay (HOSPITAL_COMMUNITY)
Admission: EM | Admit: 2017-05-17 | Discharge: 2017-05-20 | DRG: 178 | Disposition: A | Discharge status: Hospice | Attending: Family Medicine | Admitting: Family Medicine

## 2017-05-17 ENCOUNTER — Emergency Department (HOSPITAL_COMMUNITY)

## 2017-05-17 DIAGNOSIS — I110 Hypertensive heart disease with heart failure: Secondary | ICD-10-CM | POA: Diagnosis present

## 2017-05-17 DIAGNOSIS — E1165 Type 2 diabetes mellitus with hyperglycemia: Secondary | ICD-10-CM | POA: Diagnosis present

## 2017-05-17 DIAGNOSIS — Z89612 Acquired absence of left leg above knee: Secondary | ICD-10-CM

## 2017-05-17 DIAGNOSIS — Z515 Encounter for palliative care: Secondary | ICD-10-CM | POA: Diagnosis present

## 2017-05-17 DIAGNOSIS — A419 Sepsis, unspecified organism: Secondary | ICD-10-CM

## 2017-05-17 DIAGNOSIS — Z955 Presence of coronary angioplasty implant and graft: Secondary | ICD-10-CM

## 2017-05-17 DIAGNOSIS — E785 Hyperlipidemia, unspecified: Secondary | ICD-10-CM | POA: Diagnosis present

## 2017-05-17 DIAGNOSIS — Z7401 Bed confinement status: Secondary | ICD-10-CM

## 2017-05-17 DIAGNOSIS — E162 Hypoglycemia, unspecified: Secondary | ICD-10-CM | POA: Diagnosis not present

## 2017-05-17 DIAGNOSIS — R4182 Altered mental status, unspecified: Secondary | ICD-10-CM | POA: Diagnosis not present

## 2017-05-17 DIAGNOSIS — I10 Essential (primary) hypertension: Secondary | ICD-10-CM | POA: Diagnosis not present

## 2017-05-17 DIAGNOSIS — Z6829 Body mass index (BMI) 29.0-29.9, adult: Secondary | ICD-10-CM

## 2017-05-17 DIAGNOSIS — I69951 Hemiplegia and hemiparesis following unspecified cerebrovascular disease affecting right dominant side: Secondary | ICD-10-CM | POA: Diagnosis not present

## 2017-05-17 DIAGNOSIS — R509 Fever, unspecified: Secondary | ICD-10-CM | POA: Diagnosis present

## 2017-05-17 DIAGNOSIS — E11649 Type 2 diabetes mellitus with hypoglycemia without coma: Secondary | ICD-10-CM | POA: Diagnosis present

## 2017-05-17 DIAGNOSIS — E1151 Type 2 diabetes mellitus with diabetic peripheral angiopathy without gangrene: Secondary | ICD-10-CM | POA: Diagnosis present

## 2017-05-17 DIAGNOSIS — R627 Adult failure to thrive: Secondary | ICD-10-CM | POA: Diagnosis present

## 2017-05-17 DIAGNOSIS — N390 Urinary tract infection, site not specified: Secondary | ICD-10-CM | POA: Diagnosis present

## 2017-05-17 DIAGNOSIS — J690 Pneumonitis due to inhalation of food and vomit: Secondary | ICD-10-CM | POA: Diagnosis not present

## 2017-05-17 DIAGNOSIS — I2510 Atherosclerotic heart disease of native coronary artery without angina pectoris: Secondary | ICD-10-CM | POA: Diagnosis present

## 2017-05-17 DIAGNOSIS — I69320 Aphasia following cerebral infarction: Secondary | ICD-10-CM

## 2017-05-17 DIAGNOSIS — L89150 Pressure ulcer of sacral region, unstageable: Secondary | ICD-10-CM | POA: Diagnosis present

## 2017-05-17 DIAGNOSIS — E869 Volume depletion, unspecified: Secondary | ICD-10-CM | POA: Diagnosis present

## 2017-05-17 DIAGNOSIS — J189 Pneumonia, unspecified organism: Secondary | ICD-10-CM

## 2017-05-17 DIAGNOSIS — Z794 Long term (current) use of insulin: Secondary | ICD-10-CM

## 2017-05-17 DIAGNOSIS — Z8249 Family history of ischemic heart disease and other diseases of the circulatory system: Secondary | ICD-10-CM

## 2017-05-17 DIAGNOSIS — E1142 Type 2 diabetes mellitus with diabetic polyneuropathy: Secondary | ICD-10-CM | POA: Diagnosis present

## 2017-05-17 DIAGNOSIS — L89300 Pressure ulcer of unspecified buttock, unstageable: Secondary | ICD-10-CM | POA: Diagnosis present

## 2017-05-17 DIAGNOSIS — F0150 Vascular dementia without behavioral disturbance: Secondary | ICD-10-CM | POA: Diagnosis not present

## 2017-05-17 DIAGNOSIS — Z89611 Acquired absence of right leg above knee: Secondary | ICD-10-CM

## 2017-05-17 DIAGNOSIS — Z79899 Other long term (current) drug therapy: Secondary | ICD-10-CM

## 2017-05-17 DIAGNOSIS — Z66 Do not resuscitate: Secondary | ICD-10-CM | POA: Diagnosis present

## 2017-05-17 DIAGNOSIS — I69351 Hemiplegia and hemiparesis following cerebral infarction affecting right dominant side: Secondary | ICD-10-CM

## 2017-05-17 DIAGNOSIS — Z7901 Long term (current) use of anticoagulants: Secondary | ICD-10-CM

## 2017-05-17 DIAGNOSIS — Z86718 Personal history of other venous thrombosis and embolism: Secondary | ICD-10-CM

## 2017-05-17 DIAGNOSIS — I5032 Chronic diastolic (congestive) heart failure: Secondary | ICD-10-CM | POA: Diagnosis present

## 2017-05-17 DIAGNOSIS — E876 Hypokalemia: Secondary | ICD-10-CM | POA: Diagnosis present

## 2017-05-17 DIAGNOSIS — I672 Cerebral atherosclerosis: Secondary | ICD-10-CM | POA: Diagnosis not present

## 2017-05-17 DIAGNOSIS — Z8673 Personal history of transient ischemic attack (TIA), and cerebral infarction without residual deficits: Secondary | ICD-10-CM

## 2017-05-17 DIAGNOSIS — E161 Other hypoglycemia: Secondary | ICD-10-CM | POA: Diagnosis not present

## 2017-05-17 DIAGNOSIS — I679 Cerebrovascular disease, unspecified: Secondary | ICD-10-CM | POA: Diagnosis not present

## 2017-05-17 DIAGNOSIS — IMO0002 Reserved for concepts with insufficient information to code with codable children: Secondary | ICD-10-CM | POA: Diagnosis present

## 2017-05-17 DIAGNOSIS — H5462 Unqualified visual loss, left eye, normal vision right eye: Secondary | ICD-10-CM | POA: Diagnosis present

## 2017-05-17 DIAGNOSIS — E7800 Pure hypercholesterolemia, unspecified: Secondary | ICD-10-CM

## 2017-05-17 DIAGNOSIS — Z91041 Radiographic dye allergy status: Secondary | ICD-10-CM

## 2017-05-17 DIAGNOSIS — I739 Peripheral vascular disease, unspecified: Secondary | ICD-10-CM | POA: Diagnosis not present

## 2017-05-17 LAB — COMPREHENSIVE METABOLIC PANEL
ALBUMIN: 2.5 g/dL — AB (ref 3.5–5.0)
ALK PHOS: 87 U/L (ref 38–126)
ALT: 5 U/L — AB (ref 17–63)
AST: 19 U/L (ref 15–41)
Anion gap: 14 (ref 5–15)
BUN: 11 mg/dL (ref 6–20)
CHLORIDE: 97 mmol/L — AB (ref 101–111)
CO2: 28 mmol/L (ref 22–32)
CREATININE: 0.61 mg/dL (ref 0.61–1.24)
Calcium: 9.2 mg/dL (ref 8.9–10.3)
GFR calc Af Amer: >60 mL/min (ref >60)
GFR calc non Af Amer: >60 mL/min (ref >60)
GLUCOSE: 44 mg/dL — AB (ref 65–99)
Potassium: 3.2 mmol/L — ABNORMAL LOW (ref 3.5–5.1)
SODIUM: 139 mmol/L (ref 135–145)
Total Bilirubin: 0.4 mg/dL (ref 0.3–1.2)
Total Protein: 8.8 g/dL — ABNORMAL HIGH (ref 6.5–8.1)

## 2017-05-17 LAB — CBC WITH DIFFERENTIAL/PLATELET
Basophils Absolute: 0 10*3/uL (ref 0.0–0.1)
Basophils Relative: 0 %
EOS ABS: 0 10*3/uL (ref 0.0–0.7)
Eosinophils Relative: 0 %
HCT: 39.8 % (ref 39.0–52.0)
HEMOGLOBIN: 13 g/dL (ref 13.0–17.0)
Lymphocytes Relative: 10 %
Lymphs Abs: 1.3 10*3/uL (ref 0.7–4.0)
MCH: 27.4 pg (ref 26.0–34.0)
MCHC: 32.7 g/dL (ref 30.0–36.0)
MCV: 84 fL (ref 78.0–100.0)
MONO ABS: 0.6 10*3/uL (ref 0.1–1.0)
MONOS PCT: 5 %
NEUTROS PCT: 85 %
Neutro Abs: 11.5 10*3/uL — ABNORMAL HIGH (ref 1.7–7.7)
Platelets: 572 10*3/uL — ABNORMAL HIGH (ref 150–400)
RBC: 4.74 MIL/uL (ref 4.22–5.81)
RDW: 14.5 % (ref 11.5–15.5)
WBC: 13.4 10*3/uL — ABNORMAL HIGH (ref 4.0–10.5)

## 2017-05-17 LAB — CBG MONITORING, ED
Glucose-Capillary: 67 mg/dL (ref 65–99)
Glucose-Capillary: 109 mg/dL — ABNORMAL HIGH (ref 65–99)

## 2017-05-17 LAB — I-STAT TROPONIN, ED: TROPONIN I, POC: 0.06 ng/mL (ref 0.00–0.08)

## 2017-05-17 LAB — I-STAT CG4 LACTIC ACID, ED: LACTIC ACID, VENOUS: 1.71 mmol/L (ref 0.5–1.9)

## 2017-05-17 MED ORDER — SODIUM CHLORIDE 0.9 % IV SOLN
1.0000 g | Freq: Once | INTRAVENOUS | Status: AC
  Administered 2017-05-17: 1 g via INTRAVENOUS
  Filled 2017-05-17: qty 10

## 2017-05-17 MED ORDER — ACETAMINOPHEN 650 MG RE SUPP
650.0000 mg | Freq: Once | RECTAL | Status: DC
  Filled 2017-05-17: qty 1

## 2017-05-17 MED ORDER — SODIUM CHLORIDE 0.9 % IV BOLUS (SEPSIS)
1000.0000 mL | Freq: Once | INTRAVENOUS | Status: AC
  Administered 2017-05-17: 1000 mL via INTRAVENOUS

## 2017-05-17 MED ORDER — AZITHROMYCIN 500 MG IV SOLR
500.0000 mg | Freq: Once | INTRAVENOUS | Status: AC
  Administered 2017-05-17: 500 mg via INTRAVENOUS
  Filled 2017-05-17: qty 500

## 2017-05-17 MED ORDER — AZITHROMYCIN 500 MG IV SOLR: 500.0000 mg | INTRAVENOUS | Status: DC

## 2017-05-17 MED ORDER — DEXTROSE-NACL 5-0.9 % IV SOLN
INTRAVENOUS | Status: DC
  Administered 2017-05-17 – 2017-05-18 (×2): via INTRAVENOUS

## 2017-05-17 MED ORDER — DEXTROSE 50 % IV SOLN
1.0000 | Freq: Once | INTRAVENOUS | Status: AC
  Administered 2017-05-17: 50 mL via INTRAVENOUS
  Filled 2017-05-17: qty 50

## 2017-05-17 MED ORDER — SODIUM CHLORIDE 0.9 % IV SOLN: 1.0000 g | INTRAVENOUS | Status: DC

## 2017-05-17 NOTE — ED Provider Notes (Addendum)
[facility] EMERGENCY DEPARTMENT Provider Note   CSN: [id number] Arrival date & time: 05/17/17  1702     History   Chief Complaint Chief Complaint  Patient presents with  . Failure To Thrive    HPI [name] is a 62 y.o. male.  HPI Patient presents to the emergency department with decreased oral intake and fever.  The family states that he is a DNR and he is on hospice care but has not been eating and drinking as per his normal.  This is been over the last 2 days.  The patient is not able to speak which is chronic for the patient and is unable to give any history.  The family states not noticed any other symptoms. Past Medical History:  Diagnosis Date  . Adrenal insufficiency ([place])   . Allergy   . Arthritis   . Blind left eye   . Carpal tunnel syndrome   . Cataract    bil removed  . CHF (congestive heart failure) ([place])   . Diabetes mellitus without complication ([place])   . ED (erectile dysfunction)   . Gout   . Herpes   . Hypercholesterolemia   . Hypertension   . MI (myocardial infarction) ([place])   . Nausea & vomiting   . Peripheral neuropathy   . Pneumonia   . PVD (peripheral vascular disease) [facility])     Patient Active Problem List   Diagnosis Date Noted  . Fever   . Acute on chronic alteration in mental status   . Status post bilateral above knee amputation ([place])   . Goals of care, counseling/discussion   . Palliative care by specialist   . Enterococcal bacteremia 08/13/2016  . Bacteremia due to Escherichia coli 08/13/2016  . Proteus infection 08/13/2016  . Osteomyelitis of left foot ([place]) 08/13/2016  . History of CVA (cerebrovascular accident) 08/12/2016  . Osteomyelitis of right foot ([place]) 08/12/2016  . HLD (hyperlipidemia) 05/15/2016  . AKI (acute kidney injury) ([place]) 05/10/2016  . Pressure injury of skin 05/10/2016  . Dysphagia due to recent stroke 04/28/2016  . Hyperlipidemia associated with type 2 diabetes mellitus ([place])  04/28/2016  . Depression 04/28/2016  . Urinary retention   . Cognitive and behavioral changes   . Cerebrovascular accident (CVA) due to embolism of left posterior cerebral artery ([place])   . Renovascular hypertension   . History of DVT (deep vein thrombosis)   . Coronary artery disease involving native coronary artery of native heart without angina pectoris   . Morbid obesity ([place])   . Normocytic anemia   . Embolic stroke involving posterior cerebral artery ([place]) 04/03/2016  . Brainstem stroke ([place]) 04/01/2016  . Abnormality of gait and mobility   . Cerebral infarction due to thrombosis of other cerebral artery ([place])   . Right arm numbness   . Left sided numbness 03/29/2016  . TIA (transient ischemic attack) 03/29/2016  . Hypertensive urgency 03/29/2016  . Sepsis ([place]) 09/10/2013  . Personal history of DVT (deep vein thrombosis) 01/01/2013  . Diabetes type 2, uncontrolled ([place]) 12/29/2012  . Hypertension   . Cardiomyopathy ([place])   . Chronic diastolic CHF (congestive heart failure) ([place])   . Peripheral neuropathy   . Special screening for malignant neoplasms, colon 12/09/2012    Past Surgical History:  Procedure Laterality Date  . ABDOMINAL AORTOGRAM W/LOWER EXTREMITY N/A 06/19/2016   Procedure: Abdominal Aortogram w/Lower Extremity;  Surgeon: Serafina Mitchell, MD;  Location: [facility];  Service: Cardiovascular;  Laterality: N/A;  . AMPUTATION Bilateral 08/16/2016   Procedure: BILATERAL AMPUTATION ABOVE KNEE;  Surgeon: Serafina Mitchell, MD;  Location: [facility] OR;  Service: Vascular;  Laterality: Bilateral;  . CHOLECYSTECTOMY    . CORONARY ANGIOPLASTY WITH STENT PLACEMENT  2007   two 2.5  x 13 mm Cypher stents to the RCA  . dilatera cataracts removed    . right knee surgary          Home Medications    Prior to Admission medications   Medication Sig Start Date End Date Taking? Authorizing Provider  acetaminophen (TYLENOL) 160 MG/5ML suspension Take 650 mg by mouth as  needed for mild pain. 20.3 ml ( 650 mg total)     [provider]  albuterol (PROVENTIL) (2.5 MG/3ML) 0.083% nebulizer solution Take 3 mLs (2.5 mg total) by nebulization every 2 (two) hours as needed for shortness of breath. 08/28/16   Eugenie Filler, MD  Amino Acids-Protein Hydrolys (FEEDING SUPPLEMENT, PRO-STAT SUGAR FREE 64,) LIQD Take 30 mLs by mouth 2 (two) times daily.    [provider]  amLODipine (NORVASC) 10 MG tablet Take 1 tablet (10 mg total) by mouth daily. 08/28/16   Eugenie Filler, MD  atorvastatin (LIPITOR) 40 MG tablet Take 1 tablet (40 mg total) by mouth daily at 6 PM. 08/28/16   Eugenie Filler, MD  bethanechol (URECHOLINE) 10 MG tablet Take 1 tablet (10 mg total) by mouth 3 (three) times daily. 08/28/16   Eugenie Filler, MD  bisacodyl (DULCOLAX) 10 MG suppository Place 10 mg rectally as needed for moderate constipation.    [provider]  carvedilol (COREG) 25 MG tablet Take 1 tablet (25 mg total) by mouth 2 (two) times daily with a meal. 08/28/16   Eugenie Filler, MD  cloNIDine (CATAPRES) 0.3 MG tablet Take 1 tablet (0.3 mg total) by mouth 3 (three) times daily. 08/28/16   Eugenie Filler, MD  docusate sodium (COLACE) 100 MG capsule Take 1 capsule (100 mg total) by mouth daily. 08/28/16   Eugenie Filler, MD  FLUoxetine (PROZAC) 10 MG capsule Take 1 capsule (10 mg total) by mouth daily. 08/28/16   Eugenie Filler, MD  furosemide (LASIX) 40 MG tablet Take 1 tablet (40 mg total) by mouth daily as needed for edema. 08/28/16   Eugenie Filler, MD  GLUCERNA (GLUCERNA) LIQD Take 237 mLs by mouth 4 (four) times daily.    [provider]  guaiFENesin-dextromethorphan (ROBITUSSIN DM) 100-10 MG/5ML syrup Take 15 mLs by mouth every 4 (four) hours as needed for cough. 08/21/16   Hosie Poisson, MD  hydrocortisone (ANUSOL-HC) 2.5 % rectal cream Place rectally 2 (two) times daily as needed for hemorrhoids or itching. 08/28/16   Eugenie Filler,  MD  insulin aspart (NOVOLOG) 100 UNIT/ML FlexPen Inject 3-15 Units into the skin 3 (three) times daily with meals. CBG with meals and SSI as follows less than 150 no insulin, 151 - 200 = 3 units, 201 - 250 = 5 units, 251 - 300 = 8 units, 301 - 350 = 11 units, 351 - 400 = 15 units, greater than 400 give 15 units and call MD 08/28/16   Eugenie Filler, MD  Insulin Glargine (TOUJEO SOLOSTAR) 300 UNIT/ML SOPN Inject 40 Units into the skin at bedtime. 08/28/16   Eugenie Filler, MD  isosorbide-hydrALAZINE (BIDIL) 20-37.5 MG tablet Take 2 tablets by mouth 3 (three) times daily. 08/28/16   Eugenie Filler, MD  lidocaine (  XYLOCAINE) 2 % jelly Apply topically as needed (Use with in and out catheter). 04/25/16   Thain, Ivan Anchors, PA-C  Maltodextrin-Xanthan Gum ([place]) POWD As needed. 08/21/16   Hosie Poisson, MD  mirtazapine (REMERON) 7.5 MG tablet Take 1 tablet (7.5 mg total) by mouth at bedtime. 08/28/16   Eugenie Filler, MD  Multiple Vitamins-Minerals (MULTIVITAMIN PO) Take 1 tablet by mouth daily.    [provider]  ondansetron (ZOFRAN-ODT) 4 MG disintegrating tablet Take 4 mg by mouth every 6 (six) hours as needed for nausea or vomiting.     [provider]  pantoprazole (PROTONIX) 40 MG tablet Take 1 tablet (40 mg total) by mouth daily. 08/28/16   Eugenie Filler, MD  polyethylene glycol [facility] Floria Raveling) packet Take 17 g by mouth daily. 04/25/16   [place], Ivan Anchors, PA-C  QUEtiapine (SEROQUEL) 25 MG tablet Take 1 tablet (25 mg total) by mouth at bedtime. 08/28/16   Eugenie Filler, MD  rivaroxaban (XARELTO) 20 MG TABS tablet Take 1 tablet (20 mg total) by mouth daily with supper. 08/28/16   Eugenie Filler, MD  senna-docusate (SENOKOT-S) 8.6-50 MG tablet Take 1 tablet by mouth daily. 08/28/16   Eugenie Filler, MD  Sodium Phosphates (RA SALINE ENEMA) 19-7 GM/118ML ENEM Place 1 each rectally as needed (for constipation).    [provider]  tamsulosin  (FLOMAX) 0.4 MG CAPS capsule Take 1 capsule (0.4 mg total) by mouth daily after supper. 08/28/16   Eugenie Filler, MD  traMADol (ULTRAM) 50 MG tablet Take 2 tablets (100 mg total) by mouth every 6 (six) hours as needed for moderate pain. 08/21/16   Hosie Poisson, MD  zinc sulfate 220 (50 Zn) MG capsule Take 220 mg by mouth daily.    [provider]    Family History Family History  Problem Relation Age of Onset  . Breast cancer Mother   . Hypertension Father        blood clot in leg  . Diabetes Maternal Aunt   . Heart attack Sister   . Colon cancer Neg Hx   . Esophageal cancer Neg Hx   . Rectal cancer Neg Hx   . Stomach cancer Neg Hx     Social History Social History   Tobacco Use  . Smoking status: Never Smoker  . Smokeless tobacco: Never Used  Substance Use Topics  . Alcohol use: No  . Drug use: No     Allergies   Iohexol and Ivp dye [iodinated diagnostic agents]   Review of Systems Review of Systems All other systems negative except as documented in the HPI. All pertinent positives and negatives as reviewed in the HPI.  Physical Exam Updated Vital Signs BP (!) 187/80   Pulse 88   Temp (!) 100.9 F (38.3 C) (Oral)   Resp 14 Comment: Simultaneous filing. User may not have seen previous data.  SpO2 97%   Physical Exam  Constitutional: He is oriented to person, place, and time. He appears well-developed and well-nourished. No distress.  HENT:  Head: Normocephalic and atraumatic.  Mouth/Throat: Oropharynx is clear and moist.  Eyes: Pupils are equal, round, and reactive to light.  Neck: Normal range of motion. Neck supple.  Cardiovascular: Normal rate, regular rhythm and normal heart sounds. Exam reveals no gallop and no friction rub.  No murmur heard. Pulmonary/Chest: Effort normal and breath sounds normal. No respiratory distress. He has no wheezes.  Patient has crackles in the bases  of his lungs.  Abdominal: Soft. Bowel sounds are normal. He  exhibits no distension. There is no tenderness.  Neurological: He is alert and oriented to person, place, and time. He exhibits normal muscle tone. Coordination normal.  Skin: Skin is warm and dry. Capillary refill takes less than 2 seconds. No rash noted. No erythema.  Psychiatric: He has a normal mood and affect. His behavior is normal.  Nursing note and vitals reviewed.    ED Treatments / Results  Labs (all labs ordered are listed, but only abnormal results are displayed) Labs Reviewed  COMPREHENSIVE METABOLIC PANEL - Abnormal; Notable for the following components:      Result Value   Potassium 3.2 (*)    Chloride 97 (*)    Glucose, Bld 44 (*)    Total Protein 8.8 (*)    Albumin 2.5 (*)    ALT 5 (*)    All other components within normal limits  CBC WITH DIFFERENTIAL/PLATELET - Abnormal; Notable for the following components:   WBC 13.4 (*)    Platelets 572 (*)    Neutro Abs 11.5 (*)    All other components within normal limits  CULTURE, BLOOD (ROUTINE X 2)  CULTURE, BLOOD (ROUTINE X 2)  URINE CULTURE  URINALYSIS, ROUTINE W REFLEX MICROSCOPIC  BRAIN NATRIURETIC PEPTIDE  CBG MONITORING, ED  I-STAT CG4 LACTIC ACID, ED  I-STAT TROPONIN, ED  I-STAT CG4 LACTIC ACID, ED    EKG None  Radiology Dg Chest Port 1 View  Result Date: 05/17/2017 CLINICAL DATA:  Altered mental status. Failure to thrive. Patient is in hospice. EXAM: PORTABLE CHEST 1 VIEW COMPARISON:  One-view chest x-ray 08/18/2016. FINDINGS: Heart size normal. Lung volumes are low. There is no edema or effusion. No focal airspace disease is present. IMPRESSION: No acute cardiopulmonary disease or significant interval change. Electronically Signed   By: San Morelle M.D.   On: 05/17/2017 18:14    Procedures Procedures (including critical care time)  Medications Ordered in ED Medications  azithromycin (ZITHROMAX) 500 mg in sodium chloride 0.9 % 250 mL IVPB (500 mg Intravenous New Bag/Given 05/17/17 2203)    acetaminophen (TYLENOL) suppository 650 mg (650 mg Rectal Not Given 05/17/17 1745)  azithromycin (ZITHROMAX) 500 mg in sodium chloride 0.9 % 250 mL IVPB (has no administration in time range)  cefTRIAXone (ROCEPHIN) 1 g in sodium chloride 0.9 % 100 mL IVPB (has no administration in time range)  cefTRIAXone (ROCEPHIN) 1 g in sodium chloride 0.9 % 100 mL IVPB (0 g Intravenous Stopped 05/17/17 2143)  sodium chloride 0.9 % bolus 1,000 mL (1,000 mLs Intravenous New Bag/Given 05/17/17 2100)  dextrose 50 % solution 50 mL (50 mLs Intravenous Given 05/17/17 2203)     Initial Impression / Assessment and Plan / ED Course  I have reviewed the triage vital signs and the nursing notes.  Pertinent labs & imaging results that were available during my care of the patient were reviewed by me and considered in my medical decision making (see chart for details).    The patient is not able to give me much history but the patient does appear to be aspirating when he vomits.  He is a DNR and on hospice care.  Patient has multiple complex medical problems.  The patient most likely has a multifactorial infectious process causing his symptoms.  The patient will be treated with IV antibiotics.  Patient will need admission to the hospital and I spoke with the [facility] for admission.   CRITICAL  CARE Performed by: Brent General Total critical care time: 30  minutes Critical care time was exclusive of separately billable procedures and treating other patients. Critical care was necessary to treat or prevent imminent or life-threatening deterioration. Critical care was time spent personally by me on the following activities: development of treatment plan with patient and/or surrogate as well as nursing, discussions with consultants, evaluation of patient's response to treatment, examination of patient, obtaining history from patient or surrogate, ordering and performing treatments and interventions, ordering and  review of laboratory studies, ordering and review of radiographic studies, pulse oximetry and re-evaluation of patient's condition.   Final Clinical Impressions(s) / ED Diagnoses   Final diagnoses:  None    ED Discharge Orders    None       Dalia Heading, PA-C 05/18/17 0040    Drenda Freeze, MD 05/21/17 [address] [place], PA-C 06-12-17 1359    Drenda Freeze, MD 05/29/17 [phone]

## 2017-05-17 NOTE — ED Notes (Signed)
Medication admin delayed due to pt vomiting heavily during IV start attempt.

## 2017-05-17 NOTE — ED Notes (Signed)
Label sent to main lab to run bnp blood test. 

## 2017-05-17 NOTE — ED Notes (Signed)
Gerald Stabs, [place] notified that glucose is 44 per lab.

## 2017-05-17 NOTE — ED Triage Notes (Addendum)
Pt arrived via [facility] after family found pt to not be eating or drinking. PT is followed by Hospice. DNR at bedside. Pt is non-verbal except for occasional moans and head nods in response to questions. Pt is alert, unable to assess orientation. EMS v/s: 160/70, 104 hr, 96%ra, cbg 75.

## 2017-05-18 ENCOUNTER — Encounter (HOSPITAL_COMMUNITY): Payer: Self-pay | Admitting: Internal Medicine

## 2017-05-18 DIAGNOSIS — E11649 Type 2 diabetes mellitus with hypoglycemia without coma: Secondary | ICD-10-CM

## 2017-05-18 DIAGNOSIS — R627 Adult failure to thrive: Secondary | ICD-10-CM

## 2017-05-18 DIAGNOSIS — J690 Pneumonitis due to inhalation of food and vomit: Secondary | ICD-10-CM | POA: Diagnosis present

## 2017-05-18 DIAGNOSIS — Z66 Do not resuscitate: Secondary | ICD-10-CM | POA: Diagnosis present

## 2017-05-18 DIAGNOSIS — Z955 Presence of coronary angioplasty implant and graft: Secondary | ICD-10-CM | POA: Diagnosis not present

## 2017-05-18 DIAGNOSIS — I679 Cerebrovascular disease, unspecified: Secondary | ICD-10-CM | POA: Diagnosis not present

## 2017-05-18 DIAGNOSIS — E1142 Type 2 diabetes mellitus with diabetic polyneuropathy: Secondary | ICD-10-CM | POA: Diagnosis present

## 2017-05-18 DIAGNOSIS — L89150 Pressure ulcer of sacral region, unstageable: Secondary | ICD-10-CM | POA: Diagnosis present

## 2017-05-18 DIAGNOSIS — I5032 Chronic diastolic (congestive) heart failure: Secondary | ICD-10-CM | POA: Diagnosis not present

## 2017-05-18 DIAGNOSIS — I69320 Aphasia following cerebral infarction: Secondary | ICD-10-CM | POA: Diagnosis not present

## 2017-05-18 DIAGNOSIS — Z8673 Personal history of transient ischemic attack (TIA), and cerebral infarction without residual deficits: Secondary | ICD-10-CM | POA: Diagnosis not present

## 2017-05-18 DIAGNOSIS — E785 Hyperlipidemia, unspecified: Secondary | ICD-10-CM | POA: Diagnosis present

## 2017-05-18 DIAGNOSIS — I110 Hypertensive heart disease with heart failure: Secondary | ICD-10-CM | POA: Diagnosis present

## 2017-05-18 DIAGNOSIS — N390 Urinary tract infection, site not specified: Secondary | ICD-10-CM | POA: Diagnosis present

## 2017-05-18 DIAGNOSIS — Z79899 Other long term (current) drug therapy: Secondary | ICD-10-CM | POA: Diagnosis not present

## 2017-05-18 DIAGNOSIS — F0150 Vascular dementia without behavioral disturbance: Secondary | ICD-10-CM | POA: Diagnosis not present

## 2017-05-18 DIAGNOSIS — L89300 Pressure ulcer of unspecified buttock, unstageable: Secondary | ICD-10-CM | POA: Diagnosis not present

## 2017-05-18 DIAGNOSIS — Z515 Encounter for palliative care: Secondary | ICD-10-CM | POA: Diagnosis present

## 2017-05-18 DIAGNOSIS — I2510 Atherosclerotic heart disease of native coronary artery without angina pectoris: Secondary | ICD-10-CM | POA: Diagnosis not present

## 2017-05-18 DIAGNOSIS — I69351 Hemiplegia and hemiparesis following cerebral infarction affecting right dominant side: Secondary | ICD-10-CM | POA: Diagnosis not present

## 2017-05-18 DIAGNOSIS — Z89612 Acquired absence of left leg above knee: Secondary | ICD-10-CM | POA: Diagnosis not present

## 2017-05-18 DIAGNOSIS — E162 Hypoglycemia, unspecified: Secondary | ICD-10-CM | POA: Diagnosis present

## 2017-05-18 DIAGNOSIS — R509 Fever, unspecified: Secondary | ICD-10-CM | POA: Diagnosis not present

## 2017-05-18 DIAGNOSIS — Z86718 Personal history of other venous thrombosis and embolism: Secondary | ICD-10-CM | POA: Diagnosis not present

## 2017-05-18 DIAGNOSIS — Z89611 Acquired absence of right leg above knee: Secondary | ICD-10-CM | POA: Diagnosis not present

## 2017-05-18 DIAGNOSIS — I672 Cerebral atherosclerosis: Secondary | ICD-10-CM | POA: Diagnosis not present

## 2017-05-18 DIAGNOSIS — E876 Hypokalemia: Secondary | ICD-10-CM | POA: Diagnosis present

## 2017-05-18 DIAGNOSIS — I739 Peripheral vascular disease, unspecified: Secondary | ICD-10-CM | POA: Diagnosis not present

## 2017-05-18 DIAGNOSIS — H5462 Unqualified visual loss, left eye, normal vision right eye: Secondary | ICD-10-CM | POA: Diagnosis present

## 2017-05-18 DIAGNOSIS — E1151 Type 2 diabetes mellitus with diabetic peripheral angiopathy without gangrene: Secondary | ICD-10-CM | POA: Diagnosis present

## 2017-05-18 DIAGNOSIS — J189 Pneumonia, unspecified organism: Secondary | ICD-10-CM | POA: Diagnosis not present

## 2017-05-18 DIAGNOSIS — E869 Volume depletion, unspecified: Secondary | ICD-10-CM | POA: Diagnosis present

## 2017-05-18 DIAGNOSIS — I10 Essential (primary) hypertension: Secondary | ICD-10-CM | POA: Diagnosis not present

## 2017-05-18 DIAGNOSIS — I69951 Hemiplegia and hemiparesis following unspecified cerebrovascular disease affecting right dominant side: Secondary | ICD-10-CM | POA: Diagnosis not present

## 2017-05-18 DIAGNOSIS — Z7401 Bed confinement status: Secondary | ICD-10-CM | POA: Diagnosis not present

## 2017-05-18 LAB — GLUCOSE, CAPILLARY
GLUCOSE-CAPILLARY: 145 mg/dL — AB (ref 65–99)
GLUCOSE-CAPILLARY: 126 mg/dL — AB (ref 65–99)
GLUCOSE-CAPILLARY: 172 mg/dL — AB (ref 65–99)
GLUCOSE-CAPILLARY: 331 mg/dL — AB (ref 65–99)
Glucose-Capillary: 72 mg/dL (ref 65–99)
Glucose-Capillary: 61 mg/dL — ABNORMAL LOW (ref 65–99)
Glucose-Capillary: 288 mg/dL — ABNORMAL HIGH (ref 65–99)
Glucose-Capillary: 286 mg/dL — ABNORMAL HIGH (ref 65–99)

## 2017-05-18 LAB — CBC
HEMATOCRIT: 37.1 % — AB (ref 39.0–52.0)
HEMOGLOBIN: 11.9 g/dL — AB (ref 13.0–17.0)
MCH: 27 pg (ref 26.0–34.0)
MCHC: 32.1 g/dL (ref 30.0–36.0)
MCV: 84.3 fL (ref 78.0–100.0)
Platelets: 532 10*3/uL — ABNORMAL HIGH (ref 150–400)
RBC: 4.4 MIL/uL (ref 4.22–5.81)
RDW: 14.9 % (ref 11.5–15.5)
WBC: 11.5 10*3/uL — ABNORMAL HIGH (ref 4.0–10.5)

## 2017-05-18 LAB — URINALYSIS, ROUTINE W REFLEX MICROSCOPIC
Bilirubin Urine: NEGATIVE
Glucose, UA: NEGATIVE mg/dL
Ketones, ur: NEGATIVE mg/dL
Nitrite: NEGATIVE
Protein, ur: 30 mg/dL — AB
SPECIFIC GRAVITY, URINE: 1.018 (ref 1.005–1.030)
Squamous Epithelial / LPF: NONE SEEN
pH: 6 (ref 5.0–8.0)

## 2017-05-18 LAB — BASIC METABOLIC PANEL
Anion gap: 15 (ref 5–15)
BUN: 10 mg/dL (ref 6–20)
CHLORIDE: 100 mmol/L — AB (ref 101–111)
CO2: 26 mmol/L (ref 22–32)
Calcium: 8.3 mg/dL — ABNORMAL LOW (ref 8.9–10.3)
Creatinine, Ser: 0.56 mg/dL — ABNORMAL LOW (ref 0.61–1.24)
GFR calc Af Amer: >60 mL/min (ref >60)
GFR calc non Af Amer: >60 mL/min (ref >60)
GLUCOSE: 124 mg/dL — AB (ref 65–99)
POTASSIUM: 2.9 mmol/L — AB (ref 3.5–5.1)
Sodium: 141 mmol/L (ref 135–145)

## 2017-05-18 LAB — MAGNESIUM: MAGNESIUM: 1.8 mg/dL (ref 1.7–2.4)

## 2017-05-18 MED ORDER — ONDANSETRON HCL 4 MG PO TABS: 4.0000 mg | ORAL_TABLET | Freq: Four times a day (QID) | ORAL | Status: DC | PRN

## 2017-05-18 MED ORDER — CLONIDINE HCL 0.3 MG/24HR TD PTWK
0.3000 mg | MEDICATED_PATCH | TRANSDERMAL | Status: DC
  Administered 2017-05-18: 0.3 mg via TRANSDERMAL
  Filled 2017-05-18: qty 1

## 2017-05-18 MED ORDER — DEXTROSE 50 % IV SOLN
INTRAVENOUS | Status: AC
  Filled 2017-05-18: qty 50

## 2017-05-18 MED ORDER — ALBUTEROL SULFATE (2.5 MG/3ML) 0.083% IN NEBU: 2.5000 mg | INHALATION_SOLUTION | RESPIRATORY_TRACT | Status: DC | PRN

## 2017-05-18 MED ORDER — PIPERACILLIN-TAZOBACTAM 3.375 G IVPB 30 MIN
3.3750 g | Freq: Once | INTRAVENOUS | Status: AC
  Administered 2017-05-18: 3.375 g via INTRAVENOUS
  Filled 2017-05-18: qty 50

## 2017-05-18 MED ORDER — CLONIDINE HCL 0.3 MG/24HR TD PTWK
0.3000 mg | MEDICATED_PATCH | TRANSDERMAL | Status: DC
  Filled 2017-05-18: qty 1

## 2017-05-18 MED ORDER — DEXTROSE-NACL 5-0.9 % IV SOLN: INTRAVENOUS | Status: DC

## 2017-05-18 MED ORDER — ENOXAPARIN SODIUM 40 MG/0.4ML ~~LOC~~ SOLN
40.0000 mg | SUBCUTANEOUS | Status: DC
  Administered 2017-05-18 – 2017-05-20 (×3): 40 mg via SUBCUTANEOUS
  Filled 2017-05-18 (×3): qty 0.4

## 2017-05-18 MED ORDER — HYDRALAZINE HCL 20 MG/ML IJ SOLN
10.0000 mg | INTRAMUSCULAR | Status: DC | PRN
  Administered 2017-05-18 – 2017-05-20 (×4): 10 mg via INTRAVENOUS
  Filled 2017-05-18 (×4): qty 1

## 2017-05-18 MED ORDER — ACETAMINOPHEN 325 MG PO TABS: 650.0000 mg | ORAL_TABLET | Freq: Four times a day (QID) | ORAL | Status: DC | PRN

## 2017-05-18 MED ORDER — VANCOMYCIN HCL IN DEXTROSE 1-5 GM/200ML-% IV SOLN
1000.0000 mg | Freq: Three times a day (TID) | INTRAVENOUS | Status: DC
Start: 2017-05-18 — End: 2017-05-20
  Administered 2017-05-18 – 2017-05-20 (×7): 1000 mg via INTRAVENOUS
  Filled 2017-05-18 (×9): qty 200

## 2017-05-18 MED ORDER — VANCOMYCIN HCL 10 G IV SOLR
2000.0000 mg | Freq: Once | INTRAVENOUS | Status: AC
  Administered 2017-05-18: 2000 mg via INTRAVENOUS
  Filled 2017-05-18: qty 2000

## 2017-05-18 MED ORDER — ONDANSETRON HCL 4 MG/2ML IJ SOLN: 4.0000 mg | Freq: Four times a day (QID) | INTRAMUSCULAR | Status: DC | PRN

## 2017-05-18 MED ORDER — POTASSIUM CHLORIDE 10 MEQ/100ML IV SOLN
10.0000 meq | INTRAVENOUS | Status: AC
  Administered 2017-05-18 – 2017-05-19 (×6): 10 meq via INTRAVENOUS
  Filled 2017-05-18 (×4): qty 100

## 2017-05-18 MED ORDER — ENOXAPARIN SODIUM 100 MG/ML ~~LOC~~ SOLN: 90.0000 mg | Freq: Two times a day (BID) | SUBCUTANEOUS | Status: DC

## 2017-05-18 MED ORDER — RESOURCE THICKENUP CLEAR PO POWD
ORAL | Status: DC | PRN
  Filled 2017-05-18: qty 125

## 2017-05-18 MED ORDER — DEXTROSE 50 % IV SOLN
50.0000 mL | Freq: Once | INTRAVENOUS | Status: AC
  Administered 2017-05-18: 50 mL via INTRAVENOUS

## 2017-05-18 MED ORDER — ACETAMINOPHEN 650 MG RE SUPP: 650.0000 mg | Freq: Four times a day (QID) | RECTAL | Status: DC | PRN

## 2017-05-18 MED ORDER — PIPERACILLIN-TAZOBACTAM 3.375 G IVPB
3.3750 g | Freq: Three times a day (TID) | INTRAVENOUS | Status: DC
  Administered 2017-05-18 – 2017-05-20 (×7): 3.375 g via INTRAVENOUS
  Filled 2017-05-18 (×9): qty 50

## 2017-05-18 NOTE — Progress Notes (Addendum)
PROGRESS NOTE   [name]  ION:[id number]    DOB: May 17, 1955    DOA: 05/17/2017  PCP: Patient, No Pcp Per   I have briefly reviewed patients previous medical records in [facility].  Brief Narrative:  62 year old male on home hospice, with PMH of DM 2, HTN, HLD, CVA with right hemiparesis and difficulty speaking, CAD, bilateral AKA, left eye blindness, chronic CHF, DVT presented with poor oral intake for past several days, noted to be vomiting and unresponsive, CBG 46, family attempted to feed patient but CBG did not, and unable to get adequate oral intake in and hence EMS activated.  Admitted for hypoglycemia with failure to thrive and suspected aspiration pneumonia.   Assessment & Plan:   Principal Problem:   Failure to thrive in adult Active Problems:   Diabetes type 2, uncontrolled (HCC)   Hypertension   Chronic diastolic CHF (congestive heart failure) (HCC)   Coronary artery disease involving native coronary artery of native heart without angina pectoris   History of CVA (cerebrovascular accident)   Fever   Diabetes with hypoglycemia: Suspect due to poor oral intake.  Blood glucose 44 on admission.  Resuscitated with IV D50 followed by D5 infusion.  Had another milder hypoglycemia/61 orally 3/20 3 AM.  Speech therapy evaluated and initiated modified diet.  Continue gentle IV D5 infusion.  Monitor closely.  Nausea and vomiting: Unclear etiology.?  Acute viral GE.  Abdominal exam benign.  Resume diet per ST and monitor closely.  Suspected aspiration pneumonia: Due to vomiting.  Empirically started on IV vancomycin and Zosyn.  Family wished to continue IV fluids and antibiotics as needed.  At high risk for recurrent aspiration.  Strict aspiration precautions and modified diet per ST.  Blood cultures negative to date.  Follow chest x-ray in a.m. ? UTI> already on Zosyn.  Dysphagia: Speech therapy has initiated dysphagia 2 diet and nectar thickened liquids.  Hypokalemia:  Replace IV and follow.  Check and replace magnesium as needed.  Normocytic anemia:?  Dilutional.  No bleeding reported.  Follow CBC in a.m.  Essential hypertension: Uncontrolled.  Continue transdermal clonidine and as needed IV hydralazine until consistent oral intake.  History of CVA with right hemiparesis and difficulty speaking: Probably at baseline.  History of CAD  History of DVT: As per family's report, patient was taken off Xarelto by hospice care.  Chronic diastolic CHF: Appears volume depleted.  Gentle IV fluids and monitor closely.  Adult failure to thrive: Multifactorial.  On home hospice.  As per family's wishes, continue IV fluids and antibiotics.   DVT prophylaxis: Lovenox Code Status: DNR Family Communication: None at bedside Disposition: DC home pending clinical improvement   Consultants:  None  Procedures:  None  Antimicrobials:  Vancomycin and Zosyn   Subjective: Seen this morning.  Unintelligible speech.  Does not follow instructions.  As per RN, no acute issues reported.  ROS: As above.  Objective:  Vitals:   05/18/17 0120 05/18/17 0620 05/18/17 1041 05/18/17 1400  BP: (!) 155/65 (!) 155/70 (!) 197/83 (!) 176/89  Pulse: 92 89 95 (!) 130  Resp: 19 18 18 18   Temp: 99.5 F (37.5 C) 99 F (37.2 C) 99.5 F (37.5 C) 98.1 F (36.7 C)  TempSrc: Oral Oral Oral Axillary  SpO2: 99% 99% 99% 98%  Weight:      Height:        Examination:  General exam: Middle-aged male, moderately built and nourished, sitting up comfortably in bed. Respiratory system:  Reduced breath sounds in the bases with few bibasilar crackles.  Rest of lung fields clear to auscultation. Respiratory effort normal. Cardiovascular system: S1 & S2 heard, RRR. No JVD, murmurs, rubs, gallops or clicks. No pedal edema. Gastrointestinal system: Abdomen is nondistended, soft and nontender. No organomegaly or masses felt. Normal bowel sounds heard. Central nervous system: Alert and tracks  activity around with his eyes, unintelligible speech and does not follow instructions.  Extremities: Healed bilateral BKA stumps Skin: No rashes, lesions or ulcers Psychiatry: Judgement and insight impaired. Mood & affect cannot be assessed.     Data Reviewed: I have personally reviewed following labs and imaging studies  CBC: Recent Labs  Lab 05/17/17 1742 05/18/17 0656  WBC 13.4* 11.5*  NEUTROABS 11.5*  --   HGB 13.0 11.9*  HCT 39.8 37.1*  MCV 84.0 84.3  PLT 572* 081*   Basic Metabolic Panel: Recent Labs  Lab 05/17/17 1742 05/18/17 0652  NA 139 141  K 3.2* 2.9*  CL 97* 100*  CO2 28 26  GLUCOSE 44* 124*  BUN 11 10  CREATININE 0.61 0.56*  CALCIUM 9.2 8.3*   Liver Function Tests: Recent Labs  Lab 05/17/17 1742  AST 19  ALT 5*  ALKPHOS 87  BILITOT 0.4  PROT 8.8*  ALBUMIN 2.5*   CBG: Recent Labs  Lab 05/18/17 0126 05/18/17 0414 05/18/17 0441 05/18/17 0749 05/18/17 1203  GLUCAP 72 61* 145* 126* 172*    Recent Results (from the past 240 hour(s))  Blood Culture (routine x 2)     Status: None (Preliminary result)   Collection Time: 05/17/17  6:51 PM  Result Value Ref Range Status   Specimen Description BLOOD ARM RIGHT  Final   Special Requests IN PEDIATRIC BOTTLE Blood Culture adequate volume  Final   Culture   Final    NO GROWTH < 24 HOURS Performed at [facility], [place] [address] [place], [place] [address]    Report Status PENDING  Incomplete  Blood Culture (routine x 2)     Status: None (Preliminary result)   Collection Time: 05/17/17  8:15 PM  Result Value Ref Range Status   Specimen Description BLOOD LEFT ANTECUBITAL  Final   Special Requests   Final    BOTTLES DRAWN AEROBIC AND ANAEROBIC Blood Culture adequate volume   Culture   Final    NO GROWTH < 24 HOURS Performed at [facility], [place] [address] [place], [place] [address]    Report Status PENDING  Incomplete         Radiology Studies: Dg Chest Port 1 View  Result  Date: 05/17/2017 CLINICAL DATA:  Altered mental status. Failure to thrive. Patient is in hospice. EXAM: PORTABLE CHEST 1 VIEW COMPARISON:  One-view chest x-ray 08/18/2016. FINDINGS: Heart size normal. Lung volumes are low. There is no edema or effusion. No focal airspace disease is present. IMPRESSION: No acute cardiopulmonary disease or significant interval change. Electronically Signed   By: San Morelle M.D.   On: 05/17/2017 18:14        Scheduled Meds: . acetaminophen  650 mg Rectal Once  . cloNIDine  0.3 mg Transdermal Weekly  . enoxaparin (LOVENOX) injection  40 mg Subcutaneous Q24H   Continuous Infusions: . dextrose 5 % and 0.9% NaCl 75 mL/hr at 05/18/17 1559  . piperacillin-tazobactam (ZOSYN)  IV 3.375 g (05/18/17 1559)  . vancomycin 1,000 mg (05/18/17 1559)     LOS: 0 days     Vernell Leep, MD,  FACP, FHM. [facility] Pager [phone]  If 7PM-7AM, please contact night-coverage [email or web address] Password [facility] 05/18/2017, 4:59 PM

## 2017-05-18 NOTE — Progress Notes (Addendum)
[facility])  Patient and daughter Velna Hatchet visited today at 12:13 by [facility] RN Pincus Large. Per Elizabeth's documentation, this is a related and covered hospitalization of 05/18/17 with [facility] diagnosis of cerebral atherosclerosis per Dr. Karie Georges.   [facility] was notified by daughter that patient was vomiting and now unresponsive. CBG 46 and daughter trying to get patient to get PO intake. CBG did not come up with time and patient unable to get adequate PO intake; EMS notified. Family reports PO intake has been declining for past several weeks days. EMS by family. Pt transported to ED where IV and D50 was given and sugars checked regularly and further assessment and evaluation for fever that began in ED.   Patient has been admitted to the hospital with a diagnosis of hypoglycemia with failure to thrive. Note plan for IV fluids, careful monitoring of CBGs as PO intake increases or not, continued ABX therapy for treatment of suspected PNA.   Patient has an OOF DNR and goal is to return home with [facility] services if patient does not decline.   [facility] will continue to follow daily and assist with discharge needs. Please use [facility] if patient needs ambulance transport.   Please call with hospice related questions or concerns.   Thank you,  Erling Conte, LCSW [phone]

## 2017-05-18 NOTE — Progress Notes (Addendum)
ANTICOAGULATION and ANTIBIOTIC CONSULT NOTE - Initial Consult  Pharmacy Consult for Lovenox and Vancocin/Zosyn Indication: VTE and sepsis  Allergies  Allergen Reactions  . Iohexol Hives  . Ivp Dye [Iodinated Diagnostic Agents] Hives    Patient Measurements: Height: 5\' 9"  (175.3 cm) Weight: 198 lb 6.6 oz (90 kg) IBW/kg (Calculated) : 70.7  Vital Signs: Temp: 100.9 F (38.3 C) (03/22 1715) Temp Source: Oral (03/22 1715) BP: 152/67 (03/23 0030) Pulse Rate: 89 (03/23 0030)  Labs: Recent Labs    05/17/17 1742  HGB 13.0  HCT 39.8  PLT 572*  CREATININE 0.61    Estimated Creatinine Clearance: 107.5 mL/min (by C-G formula based on SCr of 0.61 mg/dL).   Medical History: Past Medical History:  Diagnosis Date  . Adrenal insufficiency ([place])   . Allergy   . Arthritis   . Blind left eye   . Carpal tunnel syndrome   . Cataract    bil removed  . CHF (congestive heart failure) ([place])   . Diabetes mellitus without complication ([place])   . ED (erectile dysfunction)   . Gout   . Herpes   . Hypercholesterolemia   . Hypertension   . MI (myocardial infarction) ([place])   . Nausea & vomiting   . Peripheral neuropathy   . Pneumonia   . PVD (peripheral vascular disease) ([place])     Assessment: 62yo male presents w/ decreased PO intake and fever x2d, concern for sepsis, to begin IV ABX; also to transition from Xarelto to [place] for h/o VTE (last dose of Xarelto is currently unclear).  Goal of Therapy:  Vanc trough 15-20 Anti-Xa level 0.6-1 units/ml 4hrs after LMWH dose given Monitor platelets by anticoagulation protocol: Yes   Plan:  Rec'd Rocephin and azithro in ED. Start vancomycin 2000mg  x1 then 1000mg  IV Q8H and Zosyn 3.375g IV Q8H (4-hr infusion) and monitor CBC, Cx, levels prn. Start Lovenox 90mg  SQ Q12H and monitor CBC and levels prn.  Wynona Neat, PharmD, BCPS  05/18/2017,12:59 AM   Addendum: Pt's family has now updated MD w/ info that pt has been taken off Xarelto;  will change LMWH to VTE Px > change to Lovenox 40mg  SQ Q24H.  VB 6:40 AM

## 2017-05-18 NOTE — Progress Notes (Addendum)
Palliative Medicine RN Note: Consult order noted. Spoke with Harmon Pier with [facility]; patient is actively admitted to their service and will be seen by their RN later today. When hospice patients are admitted, they manage [place] conversations, so there is not a role for PMT at this time.    Marjie Skiff Adelle Zachar, RN, BSN, [facility] Palliative Medicine Team 05/18/2017 9:17 AM Office [phone]

## 2017-05-18 NOTE — H&P (Signed)
History and Physical    [name] JJO:[id number] DOB: 03-05-1955 DOA: 05/17/2017  PCP: Patient, No Pcp Per  Patient coming from: Home.  History obtained from patient's daughter.  Chief Complaint: Low blood sugar.  HPI: [name] is a 61 y.o. male with history of diabetes mellitus type 2, CVA with right-sided hemiparesis and difficulty speaking, CAD, hypertension, bilateral lower extremity amputation, blind left eye, CHF, and DVT was brought to the ER after patient was found to be hypoglycemic and has been having recurrent episodes of vomiting.  Patient's daughter states last few days patient has not been eating well and was found to be getting hypoglycemic since yesterday morning.  Following which patient started vomiting at least 3 episodes.  Has not had any signs of shortness of breath or abdominal discomfort.  Patient usually is mildly communicative but has not been since this episode started.  ED Course: In the ER patient blood sugar was found to be low and was given D50 following which blood sugar improved to 105.  Patient became febrile in the ER and lab work showed leukocytosis.  As per the daughter patient is DNR but okay to continue with antibiotics and hydrate for hypoglycemia and has been admitted for further observation.  Patient was started on empiric antibiotics for possible aspiration.  UA is pending.  D5 normal saline was started for the hypoglycemia.  Review of Systems: As per HPI, rest all negative.   Past Medical History:  Diagnosis Date  . Adrenal insufficiency ([place])   . Allergy   . Arthritis   . Blind left eye   . Carpal tunnel syndrome   . Cataract    bil removed  . CHF (congestive heart failure) ([place])   . Diabetes mellitus without complication ([place])   . ED (erectile dysfunction)   . Gout   . Herpes   . Hypercholesterolemia   . Hypertension   . MI (myocardial infarction) ([place])   . Nausea & vomiting   . Peripheral neuropathy   . Pneumonia   . PVD  (peripheral vascular disease) ([place])     Past Surgical History:  Procedure Laterality Date  . ABDOMINAL AORTOGRAM W/LOWER EXTREMITY N/A 06/19/2016   Procedure: Abdominal Aortogram w/Lower Extremity;  Surgeon: Serafina Mitchell, MD;  Location: [place] [facility];  Service: Cardiovascular;  Laterality: N/A;  . AMPUTATION Bilateral 08/16/2016   Procedure: BILATERAL AMPUTATION ABOVE KNEE;  Surgeon: Serafina Mitchell, MD;  Location: [facility] OR;  Service: Vascular;  Laterality: Bilateral;  . CHOLECYSTECTOMY    . CORONARY ANGIOPLASTY WITH STENT PLACEMENT  2007   two 2.5  x 13 mm Cypher stents to the RCA  . dilatera cataracts removed    . right knee surgary       reports that he has never smoked. He has never used smokeless tobacco. He reports that he does not drink alcohol or use drugs.  Allergies  Allergen Reactions  . Iohexol Hives  . Ivp Dye [Iodinated Diagnostic Agents] Hives    Family History  Problem Relation Age of Onset  . Breast cancer Mother   . Hypertension Father        blood clot in leg  . Diabetes Maternal Aunt   . Heart attack Sister   . Colon cancer Neg Hx   . Esophageal cancer Neg Hx   . Rectal cancer Neg Hx   . Stomach cancer Neg Hx     Prior to Admission medications   Medication Sig Start  Date End Date Taking? Authorizing Provider  acetaminophen (TYLENOL) 160 MG/5ML suspension Take 650 mg by mouth as needed for mild pain. 20.3 ml ( 650 mg total)     [provider]  albuterol (PROVENTIL) (2.5 MG/3ML) 0.083% nebulizer solution Take 3 mLs (2.5 mg total) by nebulization every 2 (two) hours as needed for shortness of breath. 08/28/16   Eugenie Filler, MD  Amino Acids-Protein Hydrolys (FEEDING SUPPLEMENT, PRO-STAT SUGAR FREE 64,) LIQD Take 30 mLs by mouth 2 (two) times daily.    [provider]  amLODipine (NORVASC) 10 MG tablet Take 1 tablet (10 mg total) by mouth daily. 08/28/16   Eugenie Filler, MD  atorvastatin (LIPITOR) 40 MG tablet Take 1 tablet (40  mg total) by mouth daily at 6 PM. 08/28/16   Eugenie Filler, MD  bethanechol (URECHOLINE) 10 MG tablet Take 1 tablet (10 mg total) by mouth 3 (three) times daily. 08/28/16   Eugenie Filler, MD  bisacodyl (DULCOLAX) 10 MG suppository Place 10 mg rectally as needed for moderate constipation.    [provider]  carvedilol (COREG) 25 MG tablet Take 1 tablet (25 mg total) by mouth 2 (two) times daily with a meal. 08/28/16   Eugenie Filler, MD  cloNIDine (CATAPRES) 0.3 MG tablet Take 1 tablet (0.3 mg total) by mouth 3 (three) times daily. 08/28/16   Eugenie Filler, MD  docusate sodium (COLACE) 100 MG capsule Take 1 capsule (100 mg total) by mouth daily. 08/28/16   Eugenie Filler, MD  FLUoxetine (PROZAC) 10 MG capsule Take 1 capsule (10 mg total) by mouth daily. 08/28/16   Eugenie Filler, MD  furosemide (LASIX) 40 MG tablet Take 1 tablet (40 mg total) by mouth daily as needed for edema. 08/28/16   Eugenie Filler, MD  GLUCERNA (GLUCERNA) LIQD Take 237 mLs by mouth 4 (four) times daily.    [provider]  guaiFENesin-dextromethorphan (ROBITUSSIN DM) 100-10 MG/5ML syrup Take 15 mLs by mouth every 4 (four) hours as needed for cough. 08/21/16   Hosie Poisson, MD  hydrocortisone (ANUSOL-HC) 2.5 % rectal cream Place rectally 2 (two) times daily as needed for hemorrhoids or itching. 08/28/16   Eugenie Filler, MD  insulin aspart (NOVOLOG) 100 UNIT/ML FlexPen Inject 3-15 Units into the skin 3 (three) times daily with meals. CBG with meals and SSI as follows less than 150 no insulin, 151 - 200 = 3 units, 201 - 250 = 5 units, 251 - 300 = 8 units, 301 - 350 = 11 units, 351 - 400 = 15 units, greater than 400 give 15 units and call MD 08/28/16   Eugenie Filler, MD  Insulin Glargine (TOUJEO SOLOSTAR) 300 UNIT/ML SOPN Inject 40 Units into the skin at bedtime. 08/28/16   Eugenie Filler, MD  isosorbide-hydrALAZINE (BIDIL) 20-37.5 MG tablet Take 2 tablets by mouth 3 (three) times daily.  08/28/16   Eugenie Filler, MD  lidocaine (XYLOCAINE) 2 % jelly Apply topically as needed (Use with in and out catheter). 04/25/16   Branham, Ivan Anchors, PA-C  Maltodextrin-Xanthan Gum ([place]) POWD As needed. 08/21/16   Hosie Poisson, MD  mirtazapine (REMERON) 7.5 MG tablet Take 1 tablet (7.5 mg total) by mouth at bedtime. 08/28/16   Eugenie Filler, MD  Multiple Vitamins-Minerals (MULTIVITAMIN PO) Take 1 tablet by mouth daily.    [provider]  ondansetron (ZOFRAN-ODT) 4 MG disintegrating tablet Take 4 mg by mouth every 6 (six) hours  as needed for nausea or vomiting.     [provider]  pantoprazole (PROTONIX) 40 MG tablet Take 1 tablet (40 mg total) by mouth daily. 08/28/16   Eugenie Filler, MD  polyethylene glycol [facility] / Floria Raveling) packet Take 17 g by mouth daily. 04/25/16   [place]ken, Ivan Anchors, PA-C  QUEtiapine (SEROQUEL) 25 MG tablet Take 1 tablet (25 mg total) by mouth at bedtime. 08/28/16   Eugenie Filler, MD  rivaroxaban (XARELTO) 20 MG TABS tablet Take 1 tablet (20 mg total) by mouth daily with supper. 08/28/16   Eugenie Filler, MD  senna-docusate (SENOKOT-S) 8.6-50 MG tablet Take 1 tablet by mouth daily. 08/28/16   Eugenie Filler, MD  Sodium Phosphates (RA SALINE ENEMA) 19-7 GM/118ML ENEM Place 1 each rectally as needed (for constipation).    [provider]  tamsulosin (FLOMAX) 0.4 MG CAPS capsule Take 1 capsule (0.4 mg total) by mouth daily after supper. 08/28/16   Eugenie Filler, MD  traMADol (ULTRAM) 50 MG tablet Take 2 tablets (100 mg total) by mouth every 6 (six) hours as needed for moderate pain. 08/21/16   Hosie Poisson, MD  zinc sulfate 220 (50 Zn) MG capsule Take 220 mg by mouth daily.    [provider]    Physical Exam: Vitals:   05/17/17 2230 05/17/17 2330 05/18/17 0000 05/18/17 0030  BP: (!) 147/71 (!) 143/68 136/74 (!) 152/67  Pulse: 93 89 89 89  Resp:      Temp:      TempSrc:      SpO2: 95% 96% 94% 95%       Constitutional: Moderately built and nourished. Vitals:   05/17/17 2230 05/17/17 2330 05/18/17 0000 05/18/17 0030  BP: (!) 147/71 (!) 143/68 136/74 (!) 152/67  Pulse: 93 89 89 89  Resp:      Temp:      TempSrc:      SpO2: 95% 96% 94% 95%   Eyes: Anicteric no pallor. ENMT: No discharge from the ears eyes nose or mouth. Neck: No mass.  No neck rigidity. Respiratory: No rhonchi or crepitations. Cardiovascular: S1-S2 heard no murmurs appreciated. Abdomen: Soft nontender bowel sounds present. Musculoskeletal: Bilateral AKA.  Stump looks clean. Skin: Stump looks clean. Neurologic: Alert awake difficult to talk and appears confused.  Cannot moves right side from previous stroke. Psychiatric: Appears confused.   Labs on Admission: I have personally reviewed following labs and imaging studies  CBC: Recent Labs  Lab 05/17/17 1742  WBC 13.4*  NEUTROABS 11.5*  HGB 13.0  HCT 39.8  MCV 84.0  PLT 324*   Basic Metabolic Panel: Recent Labs  Lab 05/17/17 1742  NA 139  K 3.2*  CL 97*  CO2 28  GLUCOSE 44*  BUN 11  CREATININE 0.61  CALCIUM 9.2   GFR: CrCl cannot be calculated (Unknown ideal weight.). Liver Function Tests: Recent Labs  Lab 05/17/17 1742  AST 19  ALT 5*  ALKPHOS 87  BILITOT 0.4  PROT 8.8*  ALBUMIN 2.5*   No results for input(s): LIPASE, AMYLASE in the last 168 hours. No results for input(s): AMMONIA in the last 168 hours. Coagulation Profile: No results for input(s): INR, PROTIME in the last 168 hours. Cardiac Enzymes: No results for input(s): CKTOTAL, CKMB, CKMBINDEX, TROPONINI in the last 168 hours. BNP (last 3 results) No results for input(s): PROBNP in the last 8760 hours. HbA1C: No results for input(s): HGBA1C in the last 72 hours. CBG: Recent Labs  Lab 05/17/17  1710 05/17/17 2237  GLUCAP 67 109*   Lipid Profile: No results for input(s): CHOL, HDL, LDLCALC, TRIG, CHOLHDL, LDLDIRECT in the last 72 hours. Thyroid Function  Tests: No results for input(s): TSH, T4TOTAL, FREET4, T3FREE, THYROIDAB in the last 72 hours. Anemia Panel: No results for input(s): VITAMINB12, FOLATE, FERRITIN, TIBC, IRON, RETICCTPCT in the last 72 hours. Urine analysis:    Component Value Date/Time   COLORURINE YELLOW 08/18/2016 1305   APPEARANCEUR CLOUDY (A) 08/18/2016 1305   LABSPEC 1.013 08/18/2016 1305   PHURINE 5.0 08/18/2016 1305   GLUCOSEU NEGATIVE 08/18/2016 1305   HGBUR NEGATIVE 08/18/2016 1305   BILIRUBINUR NEGATIVE 08/18/2016 1305   KETONESUR NEGATIVE 08/18/2016 1305   PROTEINUR 30 (A) 08/18/2016 1305   UROBILINOGEN 0.2 07/12/2014 1228   NITRITE NEGATIVE 08/18/2016 1305   LEUKOCYTESUR TRACE (A) 08/18/2016 1305   Sepsis Labs: @LABRCNTIP (procalcitonin:4,lacticidven:4) )No results found for this or any previous visit (from the past 240 hour(s)).   Radiological Exams on Admission: Dg Chest Port 1 View  Result Date: 05/17/2017 CLINICAL DATA:  Altered mental status. Failure to thrive. Patient is in hospice. EXAM: PORTABLE CHEST 1 VIEW COMPARISON:  One-view chest x-ray 08/18/2016. FINDINGS: Heart size normal. Lung volumes are low. There is no edema or effusion. No focal airspace disease is present. IMPRESSION: No acute cardiopulmonary disease or significant interval change. Electronically Signed   By: [place] Morelle M.D.   On: 05/17/2017 18:14      Assessment/Plan Principal Problem:   Failure to thrive in adult Active Problems:   Diabetes type 2, uncontrolled (HCC)   Hypertension   Chronic diastolic CHF (congestive heart failure) ([place])   Coronary artery disease involving native coronary artery of native heart without angina pectoris   History of CVA (cerebrovascular accident)   Fever    1. Hypoglycemia with failure to thrive -presently on D5 normal saline.  Follow CBGs.  Getting swallow evaluation for concern with aspiration. 2. Fever -on vancomycin and Zosyn for possible aspiration.  Follow cultures.  Check  UA.  Swallow team evaluation. 3. Hypertension -since patient cannot swallow have placed patient on PRN IV hydralazine along with clonidine patch.  Follow blood pressure trends. 4. Diabetes mellitus type 2 presently hypoglycemic.  See #1. 5. History of CVA with right-sided hemiparesis and difficulty speaking.  Presently n.p.o. awaiting swallow evaluation. 6. History of CAD presently on hospice care. 7. History of DVT -as per the patient's daughter patient was taken off Xarelto by the hospice care and during this admission patient will be on DVT prophylaxis dose only.  Patient's daughter is agreeable. 8. History of CHF appears dehydrated on IV fluids now   DVT prophylaxis: Lovenox. Code Status: DNR. Family Communication: Patient's daughter. Disposition Plan: Home. Consults called: Palliative care. Admission status: Observation.   Rise Patience MD [facility] Pager [phone].  If 7PM-7AM, please contact night-coverage [email or web address] Password [facility]  05/18/2017, 12:49 AM

## 2017-05-18 NOTE — Plan of Care (Signed)
  Problem: Health Behavior/Discharge Planning: Goal: Ability to manage health-related needs will improve Outcome: Progressing   Problem: Nutrition: Goal: Adequate nutrition will be maintained Outcome: Progressing   

## 2017-05-18 NOTE — Progress Notes (Signed)
Received pt from ED. Pt non verbal. No s/s pain noted.Chronic foley intact. Oriented to room and call bell.

## 2017-05-18 NOTE — Evaluation (Signed)
Clinical/Bedside Swallow Evaluation Patient Details  Name: [name] MRN: [id number] Date of Birth: 09-09-1955  Today's Date: 05/18/2017 Time: SLP Start Time (ACUTE ONLY): 0957 SLP Stop Time (ACUTE ONLY): 1015 SLP Time Calculation (min) (ACUTE ONLY): 18 min  Past Medical History:  Past Medical History:  Diagnosis Date  . Adrenal insufficiency ([place])   . Allergy   . Arthritis   . Blind left eye   . Carpal tunnel syndrome   . Cataract    bil removed  . CHF (congestive heart failure) ([place])   . Diabetes mellitus without complication ([place])   . ED (erectile dysfunction)   . Gout   . Herpes   . Hypercholesterolemia   . Hypertension   . MI (myocardial infarction) ([place])   . Nausea & vomiting   . Peripheral neuropathy   . Pneumonia   . PVD (peripheral vascular disease) ([place])    Past Surgical History:  Past Surgical History:  Procedure Laterality Date  . ABDOMINAL AORTOGRAM W/LOWER EXTREMITY N/A 06/19/2016   Procedure: Abdominal Aortogram w/Lower Extremity;  Surgeon: Serafina Mitchell, MD;  Location: [place] CV LAB;  Service: Cardiovascular;  Laterality: N/A;  . AMPUTATION Bilateral 08/16/2016   Procedure: BILATERAL AMPUTATION ABOVE KNEE;  Surgeon: Serafina Mitchell, MD;  Location: [facility] OR;  Service: Vascular;  Laterality: Bilateral;  . CHOLECYSTECTOMY    . CORONARY ANGIOPLASTY WITH STENT PLACEMENT  2007   two 2.5  x 13 mm Cypher stents to the RCA  . dilatera cataracts removed    . right knee surgary     HPI:  [name] is a 62 y.o. male with history of diabetes mellitus type 2, CVA with right-sided hemiparesis and difficulty speaking, CAD, hypertension, bilateral lower extremity amputation, blind left eye, CHF, and DVT was brought to the ER after patient was found to be hypoglycemic and has been having recurrent episodes of vomiting. Admitted with hypoglycemia with failure to thrive, fever with concern for possible aspiration. Pt has a history of dysphagia; MBS 04/25/16  recommended conversative diet of dysphagia 2 with necatr thick liquids with supervised trials of thin liquids due to mild to moderate oral phase deficits (prolonged preparation and piecemealing) and delayed swallow initiation to pyriform sinuses. No aspiration observed on exam, however clinically pt continued to have coughing and vocal changes with thin liquids. CXR with no acute findings, however pt has been febrile.   Assessment / Plan / Recommendation Clinical Impression   Patient presents with mild risk for aspiration due to history of dysphagia, cognitive deficits. Clinically, there appear to be subjective improvements with toleration of thin liquids; pt's swallow initiation appears more timely and there are no overt signs of aspiration observed, even with consecutive straw sips. Oral manipulation of soft chopped solids remains prolonged, requiring extended time and cues for clearance. Daughter reports pt has been consuming soft solids, nectar thick liquids prior to admit. Will initiate dys 2, nectar thick liquids, meds whole with puree; recommend MBS to determine whether pt can advance liquids. Fluoro schedule limited on weekends; will attempt but may not be able to complete MBS until Monday. It would also be appropriate for pt to return as an outpatient for MBS if unable to complete during admission; d/w with pt's daughter.   SLP Visit Diagnosis: Dysphagia, unspecified (R13.10)    Aspiration Risk  Mild aspiration risk    Diet Recommendation Dysphagia 2 (Fine chop);Nectar-thick liquid   Liquid Administration via: Cup;Straw Medication Administration: Whole meds with puree Supervision: Full  supervision/cueing for compensatory strategies;Staff to assist with self feeding Compensations: Minimize environmental distractions;Slow rate;Small sips/bites    Other  Recommendations Oral Care Recommendations: Oral care BID Other Recommendations: Order thickener from pharmacy   Follow up Recommendations  Other (comment)(tba)      Frequency and Duration            Prognosis Prognosis for Safe Diet Advancement: Good Barriers to Reach Goals: Cognitive deficits;Time post onset      Swallow Study   General Date of Onset: 05/18/17 HPI: [name] is a 62 y.o. male with history of diabetes mellitus type 2, CVA with right-sided hemiparesis and difficulty speaking, CAD, hypertension, bilateral lower extremity amputation, blind left eye, CHF, and DVT was brought to the ER after patient was found to be hypoglycemic and has been having recurrent episodes of vomiting. Admitted with hypoglycemia with failure to thrive, fever with concern for possible aspiration. Pt has a history of dysphagia; MBS 04/25/16 recommended conversative diet of dysphagia 2 with necatr thick liquids with supervised trials of thin liquids due to mild to moderate oral phase deficits (prolonged preparation and piecemealing) and delayed swallow initiation to pyriform sinuses. No aspiration observed on exam, however clinically pt continued to have coughing and vocal changes with thin liquids. CXR with no acute findings, however pt has been febrile. Type of Study: Bedside Swallow Evaluation Previous Swallow Assessment: see HPI Diet Prior to this Study: NPO(per daughter consumes soft diet, nectar thick liquids at hom) Temperature Spikes Noted: No Respiratory Status: Room air History of Recent Intubation: No Behavior/Cognition: Alert;Cooperative Oral Cavity Assessment: Within Functional Limits Oral Care Completed by SLP: No Oral Cavity - Dentition: Adequate natural dentition Self-Feeding Abilities: Total assist(unclear if pt refusing or unable to feed self) Patient Positioning: Upright in bed Baseline Vocal Quality: Normal Volitional Cough: Cognitively unable to elicit Volitional Swallow: Unable to elicit    Oral/Motor/Sensory Function Overall Oral Motor/Sensory Function: Generalized oral weakness   Ice Chips Ice chips: Within  functional limits   Thin Liquid Thin Liquid: Within functional limits Presentation: Cup;Straw    Nectar Thick     Honey Thick Honey Thick Liquid: Not tested   Puree Puree: Within functional limits Presentation: Spoon   Solid   GO   Solid: Impaired(dys 2 solid) Presentation: Spoon Oral Phase Impairments: Impaired mastication;Reduced lingual movement/coordination Oral Phase Functional Implications: Prolonged oral transit;Impaired mastication;Oral residue        Aliene Altes 05/18/2017,10:26 AM  Deneise Lever, [place], [place] Pathologist [phone]

## 2017-05-18 NOTE — Progress Notes (Signed)
Hypoglycemic Event  CBG: 61  Treatment: D50 IV 50 mL  Symptoms: None  Follow-up CBG: Time:0441 CBG Result:145  Possible Reasons for Event: Inadequate meal intake  Comments/MD notified:C. Bodenheimer via text page    [name]

## 2017-05-19 ENCOUNTER — Inpatient Hospital Stay (HOSPITAL_COMMUNITY)

## 2017-05-19 DIAGNOSIS — I10 Essential (primary) hypertension: Secondary | ICD-10-CM

## 2017-05-19 DIAGNOSIS — J189 Pneumonia, unspecified organism: Secondary | ICD-10-CM

## 2017-05-19 DIAGNOSIS — L89300 Pressure ulcer of unspecified buttock, unstageable: Secondary | ICD-10-CM

## 2017-05-19 DIAGNOSIS — I5032 Chronic diastolic (congestive) heart failure: Secondary | ICD-10-CM

## 2017-05-19 DIAGNOSIS — Z8673 Personal history of transient ischemic attack (TIA), and cerebral infarction without residual deficits: Secondary | ICD-10-CM

## 2017-05-19 DIAGNOSIS — I2510 Atherosclerotic heart disease of native coronary artery without angina pectoris: Secondary | ICD-10-CM

## 2017-05-19 LAB — CBC
HCT: 36.9 % — ABNORMAL LOW (ref 39.0–52.0)
Hemoglobin: 11.6 g/dL — ABNORMAL LOW (ref 13.0–17.0)
MCH: 27 pg (ref 26.0–34.0)
MCHC: 31.4 g/dL (ref 30.0–36.0)
MCV: 85.8 fL (ref 78.0–100.0)
Platelets: 560 10*3/uL — ABNORMAL HIGH (ref 150–400)
RBC: 4.3 MIL/uL (ref 4.22–5.81)
RDW: 15.1 % (ref 11.5–15.5)
WBC: 11 10*3/uL — ABNORMAL HIGH (ref 4.0–10.5)

## 2017-05-19 LAB — BASIC METABOLIC PANEL
Anion gap: 14 (ref 5–15)
BUN: 8 mg/dL (ref 6–20)
CALCIUM: 8.2 mg/dL — AB (ref 8.9–10.3)
CO2: 26 mmol/L (ref 22–32)
Chloride: 103 mmol/L (ref 101–111)
Creatinine, Ser: 0.56 mg/dL — ABNORMAL LOW (ref 0.61–1.24)
GFR calc Af Amer: >60 mL/min (ref >60)
GLUCOSE: 272 mg/dL — AB (ref 65–99)
Potassium: 3 mmol/L — ABNORMAL LOW (ref 3.5–5.1)
Sodium: 143 mmol/L (ref 135–145)

## 2017-05-19 LAB — GLUCOSE, CAPILLARY
GLUCOSE-CAPILLARY: 241 mg/dL — AB (ref 65–99)
Glucose-Capillary: 257 mg/dL — ABNORMAL HIGH (ref 65–99)
Glucose-Capillary: 266 mg/dL — ABNORMAL HIGH (ref 65–99)
Glucose-Capillary: 291 mg/dL — ABNORMAL HIGH (ref 65–99)
Glucose-Capillary: 296 mg/dL — ABNORMAL HIGH (ref 65–99)
Glucose-Capillary: 234 mg/dL — ABNORMAL HIGH (ref 65–99)

## 2017-05-19 LAB — HIV ANTIBODY (ROUTINE TESTING W REFLEX): HIV SCREEN 4TH GENERATION: NONREACTIVE

## 2017-05-19 LAB — MRSA PCR SCREENING: MRSA BY PCR: NEGATIVE

## 2017-05-19 MED ORDER — POTASSIUM CHLORIDE 20 MEQ PO PACK
20.0000 meq | PACK | Freq: Every day | ORAL | Status: DC
  Administered 2017-05-19 – 2017-05-20 (×2): 20 meq via ORAL
  Filled 2017-05-19 (×2): qty 1

## 2017-05-19 MED ORDER — INSULIN ASPART 100 UNIT/ML ~~LOC~~ SOLN
0.0000 [IU] | Freq: Three times a day (TID) | SUBCUTANEOUS | Status: DC
  Administered 2017-05-19: 5 [IU] via SUBCUTANEOUS
  Administered 2017-05-19: 3 [IU] via SUBCUTANEOUS
  Administered 2017-05-20: 2 [IU] via SUBCUTANEOUS

## 2017-05-19 NOTE — Progress Notes (Signed)
PROGRESS NOTE    [name]  MBW:[id number] DOB: 05-01-1955 DOA: 05/17/2017 PCP: Patient, No Pcp Per      Brief Narrative:  62 year old male on home hospice for stroke, severe sacral ulcer and weight loss, with PMH of DM 2, HTN, HLD, CVA with right hemiparesis and difficulty speaking, CAD, bilateral AKA, left eye blindness, chronic CHF, DVT presented with poor oral intake for past several days, noted to be vomiting and unresponsive, CBG 46, family attempted to feed patient but CBG did not, and unable to get adequate oral intake in and hence EMS activated. Found to have fever, CXR clear.     Assessment & Plan:  Fever UTI -Remove foley and replace -Send urine culture  Suspected aspiration pneumonia SLP have recommended dys 2, nectar thick liquids, meds whole with puree, MBS -Obtain MBS per SLP -Continue antibiotics.  Diabetes with hypoglycemia -Stop D5 infusion -Start sliding scale insulin corrections  Hypokalemia -Start K supplement  Normocytic anemia Dilutional vs chronic disease.    Hypertension History of CVA with residual right hemiparesis and aphasia History coronary disease Chronic diastolic CHF Appears euvolemic. -Hold home carvedilol, Lasix until hemodynamics clearer -Continue clonidine for now  History DVT Had been on lifelong AC, stopped when Hospice started.  Failure to thrive Sacral unstageable ulcer  Patient was placed on Hospice over the winter due to poor interactivity, weakness, bed bound status for >1 year, and progressive sacral ulcer.  Per their report, he is normally more alert and able to make needs known, oriented to family, if not to place, situation. -Hospice following  Other medications -Hold home mirtazapine and Seroquel until mental status clearer -Hold PPI until swallowing ability clearer     DVT prophylaxis: Lovenox Code Status: DNR Family Communication: Daughter at bedside MDM and disposition Plan: The below labs and  imaging reports were reviewed.  The patient's status is clinically unchanged.  He appears to have a UTI.      Subjective: Unable to assess.  No new fever per nursing.  No respiratory disterss, no agitation, no change in mentation.  Refused PO intake today.  Objective: Vitals:   05/18/17 2247 05/19/17 0004 05/19/17 0552 05/19/17 0636  BP: (!) 177/76 (!) 157/74 (!) 196/84 (!) 150/91  Pulse: 92 92 93 97  Resp: 18  16   Temp: 98.5 F (36.9 C)  98.6 F (37 C)   TempSrc: Axillary  Axillary   SpO2: 97%  100%   Weight:      Height:        Intake/Output Summary (Last 24 hours) at 05/19/2017 1121 Last data filed at 05/19/2017 0177 Gross per 24 hour  Intake 1872.5 ml  Output 1600 ml  Net 272.5 ml   Filed Weights   05/18/17 0030  Weight: 90 kg (198 lb 6.6 oz)    Examination: General appearance: Elderly frail adult male, awake but does not respond verbally to my questions.  Blind per daughter.  Skin: Warm and dry.  Sacral wound not visualized. No suspicious rashes or lesions. Cardiac: RRR, nl S1-S2, no murmurs appreciated.  Capillary refill is brisk.   Respiratory: Normal respiratory rate and rhythm.  CTAB without rales or wheezes. Abdomen: Abdomen soft.  No TTP. No ascites, distension, hepatosplenomegaly.   MSK: No deformities or effusions.  Bilateral AKA Neuro: Right sided hemiparesis.  With daughter in room, can groan one word answers.  Moves left arm.    Psych: Unable to assses.   Data Reviewed: I have personally reviewed following labs  and imaging studies:  CBC: Recent Labs  Lab 05/17/17 1742 05/18/17 0656 05/19/17 0516  WBC 13.4* 11.5* 11.0*  NEUTROABS 11.5*  --   --   HGB 13.0 11.9* 11.6*  HCT 39.8 37.1* 36.9*  MCV 84.0 84.3 85.8  PLT 572* 532* 423*   Basic Metabolic Panel: Recent Labs  Lab 05/17/17 1742 05/18/17 0652 05/18/17 1818 05/19/17 0516  NA 139 141  --  143  K 3.2* 2.9*  --  3.0*  CL 97* 100*  --  103  CO2 28 26  --  26  GLUCOSE 44* 124*  --   272*  BUN 11 10  --  8  CREATININE 0.61 0.56*  --  0.56*  CALCIUM 9.2 8.3*  --  8.2*  MG  --   --  1.8  --    GFR: Estimated Creatinine Clearance: 107.5 mL/min (A) (by C-G formula based on SCr of 0.56 mg/dL (L)). Liver Function Tests: Recent Labs  Lab 05/17/17 1742  AST 19  ALT 5*  ALKPHOS 87  BILITOT 0.4  PROT 8.8*  ALBUMIN 2.5*   No results for input(s): LIPASE, AMYLASE in the last 168 hours. No results for input(s): AMMONIA in the last 168 hours. Coagulation Profile: No results for input(s): INR, PROTIME in the last 168 hours. Cardiac Enzymes: No results for input(s): CKTOTAL, CKMB, CKMBINDEX, TROPONINI in the last 168 hours. BNP (last 3 results) No results for input(s): PROBNP in the last 8760 hours. HbA1C: No results for input(s): HGBA1C in the last 72 hours. CBG: Recent Labs  Lab 05/18/17 1759 05/18/17 2003 05/19/17 0014 05/19/17 0412 05/19/17 0743  GLUCAP 288* 286* 241* 257* 266*   Lipid Profile: No results for input(s): CHOL, HDL, LDLCALC, TRIG, CHOLHDL, LDLDIRECT in the last 72 hours. Thyroid Function Tests: No results for input(s): TSH, T4TOTAL, FREET4, T3FREE, THYROIDAB in the last 72 hours. Anemia Panel: No results for input(s): VITAMINB12, FOLATE, FERRITIN, TIBC, IRON, RETICCTPCT in the last 72 hours. Urine analysis:    Component Value Date/Time   COLORURINE YELLOW 05/18/2017 1059   APPEARANCEUR CLOUDY (A) 05/18/2017 1059   LABSPEC 1.018 05/18/2017 1059   PHURINE 6.0 05/18/2017 1059   GLUCOSEU NEGATIVE 05/18/2017 1059   HGBUR LARGE (A) 05/18/2017 1059   BILIRUBINUR NEGATIVE 05/18/2017 1059   KETONESUR NEGATIVE 05/18/2017 1059   PROTEINUR 30 (A) 05/18/2017 1059   UROBILINOGEN 0.2 07/12/2014 1228   NITRITE NEGATIVE 05/18/2017 1059   LEUKOCYTESUR LARGE (A) 05/18/2017 1059   Sepsis Labs: @LABRCNTIP (procalcitonin:4,lacticacidven:4)  ) Recent Results (from the past 240 hour(s))  Blood Culture (routine x 2)     Status: None (Preliminary result)     Collection Time: 05/17/17  6:51 PM  Result Value Ref Range Status   Specimen Description BLOOD ARM RIGHT  Final   Special Requests IN PEDIATRIC BOTTLE Blood Culture adequate volume  Final   Culture   Final    NO GROWTH < 24 HOURS Performed at [facility], [place] [address], [place], [place] [address]    Report Status PENDING  Incomplete  Blood Culture (routine x 2)     Status: None (Preliminary result)   Collection Time: 05/17/17  8:15 PM  Result Value Ref Range Status   Specimen Description BLOOD LEFT ANTECUBITAL  Final   Special Requests   Final    BOTTLES DRAWN AEROBIC AND ANAEROBIC Blood Culture adequate volume   Culture   Final    NO GROWTH < 24 HOURS Performed at [facility]  Lab, [address] [place], [place] [address]    Report Status PENDING  Incomplete         Radiology Studies: Dg Chest Port 1 View  Result Date: 05/19/2017 CLINICAL DATA:  Pneumonia. EXAM: PORTABLE CHEST 1 VIEW COMPARISON:  05/17/2017. FINDINGS: Poor inspiration. Grossly normal sized heart. Clear lungs with normal vascularity. Mild bilateral shoulder degenerative changes. Cholecystectomy clips. IMPRESSION: No acute abnormality. Electronically Signed   By: Claudie Revering M.D.   On: 05/19/2017 11:16   Dg Chest Port 1 View  Result Date: 05/17/2017 CLINICAL DATA:  Altered mental status. Failure to thrive. Patient is in hospice. EXAM: PORTABLE CHEST 1 VIEW COMPARISON:  One-view chest x-ray 08/18/2016. FINDINGS: Heart size normal. Lung volumes are low. There is no edema or effusion. No focal airspace disease is present. IMPRESSION: No acute cardiopulmonary disease or significant interval change. Electronically Signed   By: San Morelle M.D.   On: 05/17/2017 18:14        Scheduled Meds: . cloNIDine  0.3 mg Transdermal Weekly  . enoxaparin (LOVENOX) injection  40 mg Subcutaneous Q24H   Continuous Infusions: . piperacillin-tazobactam (ZOSYN)  IV 3.375 g (05/19/17 0832)  . vancomycin  1,000 mg (05/19/17 0832)     LOS: 1 day    Time spent: 40 minutes    Edwin Dada, MD [facility] 05/19/2017, 11:21 AM     Pager [phone] --- please page though AMION:  [email or web address] Password [email or web address] If 7PM-7AM, please contact night-coverage

## 2017-05-19 NOTE — Progress Notes (Signed)
Per the request of Dr. Loleta Books, I paged him to let him know that the Pt's daughters were here to see him.

## 2017-05-19 NOTE — Progress Notes (Signed)
Modified Barium Swallow Progress Note  Patient Details  Name: [name] MRN: [id number] Date of Birth: 1955-05-29  Today's Date: 05/19/2017  Modified Barium Swallow completed.  Full report located under Chart Review in the Imaging Section.  Brief recommendations include the following:  Clinical Impression  Patient presents with moderate-severe oropharyngeal dysphagia due to oral and base of tongue weakness; pt's swallow function is primarily impacted by sensory and cognitive impairments. Oral stage characterized by mild anterior spillage, prolonged oral transit, decreased lingual manipulation with at times passive transfer of boluses into the pharynx. Pt occasionally lifts head into neck extension in apparent effort to aid transit. Pt has minimal ability or willingness to participate in self-feeding. There is prolonged pooling in the valleculae (puree) and pyriform sinuses (liquids). Pt without awareness and even with max cues (dry spoon, verbal, tactile, encouragement from daughter), swallow initiation was delayed ranging from 20 seconds to more than 2 minutes with boluses fed by clinician. Despite prolonged pooling there is no penetration or aspiration before the swallow, but the only effective means of cuing pt to initate a swallow was to offer an additional straw sip of liquid. When pt retrieves boluses himself via straw, swallow initiation is more timely. Unfortunately there is penetration and trace silent aspiration during the swallow with large straw sips of thin and nectar thick liquids due to delayed closure of the laryngeal vestibule. Mild vallecular residue due to base of tongue weakness. Pt is at risk for aspiration of all consistencies. Pt's daughter present and we discussed findings at length as well as goals of care. She verbalizes desire for pt to be comfortable and to continue eating and drinking by mouth vs a feeding tube. At the end of the exam, pt began asking for water. Pt has  been consuming soft solids and nectar thick liquids, and daughter feels this is a comfortable diet for him, though his intake has been progressively declining. I educated pt's daughter re: aspiration risk regardless of consistency and discussed progressing to thin vs nectar thick liquids. Daughter would like additional time to consider advancement of liquids with meals, but would like to initiate free water protocol, allowing pt to have regular, thin water between meals after oral care.    Swallow Evaluation Recommendations       SLP Diet Recommendations: Dysphagia 2 (Fine chop) solids;Nectar thick liquid;Thin liquid(regular thin water OK between meals after oral care)   Liquid Administration via: Straw   Medication Administration: Crushed with puree   Supervision: Staff to assist with self feeding;Full supervision/cueing for compensatory strategies   Compensations: Minimize environmental distractions;Slow rate;Small sips/bites       Oral Care Recommendations: Oral care before and after PO(oral care prior to thin water)       Deneise Lever, MS, CCC-SLP Speech-Language Pathologist [place] 05/19/2017,4:36 PM

## 2017-05-19 NOTE — Progress Notes (Signed)
[facility] W/E SW Encounter visit 1530  As previously documented, this is a related and coverd hospitalization of admission of 05/18/17 with a [facility] diagnosis of cerebral atherosclerosis per Dr. Karie Georges.    [facility] was notified by daughter that patient was vomiting and now unresponsive. CBG 46 and daughter trying to get patient to get PO intake. CBG did not come up with time and patient unable to get adequate PO intake; EMS notified. Family reports PO intake has been declining for past several weeks days. EMS by family. Pt transported to ED where IV and D50 was given and sugars checked regularly and further assessment and evaluation for fever that began in ED.   Patient has been admitted to the hospital with a diagnosis of hypoglycemia with failure to thrive.  GIP day 2  Per chart review and report from bedside RN Vinnie Level, patient is currently being treated for hypoglycemia suspected due to poor oral intake. He continues to be monitored closely on gentle IV D5 infusion. Most recent CBG 266 at 0743 this am. Glucose 272 at 0517 this am. SLP assessed 3/23 and initiated Dysphagia 2 diet with nectar thick liquids. He is being treated for suspected aspiration PNA. He continues to receive IV vancomycin and zosyn. He is high for recurrent aspiration. He is under strict aspiration precautions with newly modifed diet. His uncontrolled HTN is being treated with transdermal clonidine and as needed IV hydralazine until consistent oral intake. He appears volume depleted and is being monitored closely while receiving gentrly IV fluids. Per bedside RN Vinnie Level this morning, patient shows no interest in eating and has declined his breakfast. Per modified barium swallow eval today he has moderate-severe oropharyngeal dysphagia due to oral and base of tongue weakness; swalllow function is primarily impacted by sensory and cognitive impairments. He is at risk for aspiration of all consistencies. Diet recommendation is for Dys 2.  Family acknowledged and agreed to comfort feeds.     Visited with patient during morning with no family present. At that time he smiled but did not communicate. Follow up visit later in afternoon while daughter Velna Hatchet was present. Patient was conversant and expressing back pain. RN was involved throughout the day and offering suppor to family as they process new information. Brandi shared plan is to continue treatment for UTI to possibly improve appetite and PO intake. Family has been told patient will be in hospital 1-2 more days.    Goals of care: Patient has OOF DNR (in [place] at bedside). Per family and MD documentation, goal is to go home when clinically improved.   Discharge Planning: [facility] will continue to see patient daily while hospitalized and collaborate with hospital to anticipate discharge needs. [facility] contracts with [facility] to transport [facility] patients. Please call [facility] for ambulance transport.  PCP Dr. Verlin Fester was notified of hospitalization 05/18/17. [facility] Medication list and Transfer summary on hospital shadow chart 05/18/17.

## 2017-05-20 LAB — URINE CULTURE: Culture: NO GROWTH

## 2017-05-20 LAB — GLUCOSE, CAPILLARY
GLUCOSE-CAPILLARY: 212 mg/dL — AB (ref 65–99)
Glucose-Capillary: 197 mg/dL — ABNORMAL HIGH (ref 65–99)
Glucose-Capillary: 224 mg/dL — ABNORMAL HIGH (ref 65–99)
Glucose-Capillary: 247 mg/dL — ABNORMAL HIGH (ref 65–99)

## 2017-05-20 NOTE — Consult Note (Signed)
[name] Nurse wound consult note Reason for Consult: Unstageable pressure injury to coccygeal area in center of partial thickness wounds due to moisture Wound type:Moisture, pressure (POA) Pressure Injury POA: Yes Measurement: Entire affected area on sacrum measures 10cm x 9cm of partial thickness tissue loss with central area of measuring 5cm x 3cm with deeper hue on red spectrum.  In the center of the central area is a 2cm x 1cm area of yellow necrotic slough over the coccyxgeal area. Wound bed: As described above Drainage (amount, consistency, odor) scant serous Periwound: intact, dry Dressing procedure/placement/frequency: Patient has had bilateral above the knee amputations and his body weight is concentrated in the buttocks. He is incontinent of a large soft brown stool at the time of my assessment. The central area is full thickness, but care is directed conservatively using saline dressings twice daily and providing protection using silicone foam dressings to cover.  This will protect from incontinence and minimize friction with repositioning. While incontinent, patient does not require microclimate management with mattress replacement, our high level standard mattress is sufficient for care. Patient will continue to be turned from side to side and we will minimize the time spent in the supine position to meals/meds.  [place] nursing team will not follow, but will remain available to this patient, the nursing and medical teams.  Please re-consult if needed. Thanks, Maudie Flakes, MSN, RN, [place], Serita Grammes, [place]  Pager# [phone] .

## 2017-05-20 NOTE — Progress Notes (Signed)
[facility]osp[facility] and [facility] Visit at noon   As previously documented, this is a related and coverd hospitalization of admission of 05/18/17 with a [facility] diagnosis of cerebral atherosclerosis per Dr. Karie Georges.    [facility] was notified by daughter that patient was vomiting and now unresponsive. CBG 46 and daughter trying to get patient to get PO intake. CBG did not come up with time and patient unable to get adequate PO intake; EMS notified. Family reports PO intake has been declining for past several weeks days. EMS by family. Pt transported to ED where IV and D50 was given and sugars checked regularly and further assessment and evaluation for fever that began in ED.   Patient has been admitted to the hospital with a diagnosis of hypoglycemia with failure to thrive.  GIP day 3  Per chart review,  Patient is being treated for possible UTI with IV vancomycin and zosyn - awaiting urine culture results.  Regarding Diabetes with hypoglycemia - D5 infusion with discontinued and insulin  sliding scale started with most recent CBG is 247 at 1142 and 224 at 0746 this am. SLP assessed 3/23 and patient is being treated for suspected aspiration PNA. He is high for recurrent aspiration. He is under strict aspiration precautions with newly modifed diet Dysphagia 2 diet with nectar thick liquids. His PO intake remains limited today. Per modified barium swallow evaluation, patient has moderate-severe oropharyngeal dysphagia due to oral and base of tongue weakness; swalllow function is primarily impacted by sensory and cognitive impairments. Family acknowledged and agreed to comfort feeds. Regarding patient HTN, home carvedilol and lasix on hold and continuing clonidine at this time.    Visited with patient in room, Patient mumbling and slightly restless with daughter [name] comforting at bedside.  [name] stated that patient only PO intake are small sips of water and is not eating at this time. She also  shared that the plan is to continue treatment for UTI to possibly improve appetite and PO intake.  The family is hopeful that patient will transfer to [facility] once stable with Hospital MD approval.   Goals of care: Patient has OOF DNR (in [place] at bedside). Per family, Family is hoping for discharge to [facility] and [place] at bedside to coordinate with family and [facility] SW [place]/Hospital MD.    Discharge Planning: [facility] will continue to see patient daily while hospitalized and collaborate with hospital to anticipate discharge needs. [facility] contracts with [facility] to transport [facility] patients. Please call [facility] for ambulance transport.

## 2017-05-20 NOTE — Progress Notes (Signed)
Report given to Nira Conn, Therapist, sports at [facility]. awiting [facility].

## 2017-05-20 NOTE — Progress Notes (Signed)
Discharge to [facility] transported by [facility]. 

## 2017-05-20 NOTE — Social Work (Signed)
Clinical Social Worker facilitated patient discharge including contacting patient family and facility to confirm patient discharge plans.  Clinical information faxed to facility and family agreeable with plan.  CSW arranged ambulance transport via [facility] to [place] to call [phone] with report  prior to discharge.  Clinical Social Worker will sign off for now as social work intervention is no longer needed. Please consult [place] again if new need arises.  Alexander Mt, LCSWA Clinical Social Worker'

## 2017-05-20 NOTE — Progress Notes (Signed)
[place] SW Visit 11:45am.   LCSW spoke with [facility] RN liasion regarding Pt status and plan of care prior to visit with Pt daughter, [name], at bedside. Pt was resting when LCSW entered the room though he was grimacing. Nicole's friend, Angelica Ran, was present during portions of visit as well as Nicole's pastor for support. LCSW discussed Pt decline with Pt daughter, [name], who was tearful but realistic. [name] states desire for [facility] and paperwork for transfer completed / [facility] book provided for EOL guidance. Pt daughter aware that Pt will not receive antibiotics at [facility]. Coordination with [facility] staff member, Nira Conn, and order for Pt acceptance to [facility] obtained from [facility] MD. Pt to be transferred to [facility] today. Coordination with floor case manager, Monia Pouch, completed and she agreed to put in for Pt discharge to [facility]. Support offered to [place] with regards to care giving and self care. LCSW made [facility] home care RN, Opal Sidles, aware of plan to discharge Pt to [facility] effective today, 03/25.  Christena Deem, [facility] [place] [phone] Ext: [phone]

## 2017-05-20 NOTE — Progress Notes (Signed)
  Speech Language Pathology Treatment: Dysphagia  Patient Details Name: [name] MRN: [id number] DOB: [birth year] Today's Date: 05/20/2017 Time: 1655-3748 SLP Time Calculation (min) (ACUTE ONLY): 9 min  Assessment / Plan / Recommendation Clinical Impression  Reinforced general aspiration precautions, demonstrated pacing with assisted feeding. Pts daughter verbalized awareness of no significant benefit of nectar thick liquids and plans to offer thin water and glucerna into the future with known risk of aspiration for pts comfort. No SLP f/u needed at this time, education complete.   HPI HPI: [name] is a 62 y.o. male with history of diabetes mellitus type 2, CVA with right-sided hemiparesis and difficulty speaking, CAD, hypertension, bilateral lower extremity amputation, blind left eye, CHF, and DVT was brought to the ER after patient was found to be hypoglycemic and has been having recurrent episodes of vomiting. Admitted with hypoglycemia with failure to thrive, fever with concern for possible aspiration. Pt has a history of dysphagia; MBS 04/25/16 recommended conversative diet of dysphagia 2 with necatr thick liquids with supervised trials of thin liquids due to mild to moderate oral phase deficits (prolonged preparation and piecemealing) and delayed swallow initiation to pyriform sinuses. No aspiration observed on exam, however clinically pt continued to have coughing and vocal changes with thin liquids. CXR with no acute findings, however pt has been febrile.      SLP Plan  All goals met;Discharge SLP treatment due to (comment)       Recommendations  Diet recommendations: Thin liquid;Dysphagia 2 (fine chop);Nectar-thick liquid Liquids provided via: Cup;Straw Medication Administration: Whole meds with puree Supervision: Trained caregiver to feed patient;Full supervision/cueing for compensatory strategies Compensations: Slow rate;Small sips/bites Postural Changes and/or Swallow  Maneuvers: Seated upright 90 degrees                Follow up Recommendations: 24 hour supervision/assistance SLP Visit Diagnosis: Dysphagia, oropharyngeal phase (R13.12) Plan: All goals met;Discharge SLP treatment due to (comment)       GO                [name], [name] 05/20/2017, 11:31 AM

## 2017-05-20 NOTE — Discharge Summary (Signed)
Physician Discharge Summary  THEON SOBOTKA QVZ:[id number] DOB: 02-Aug-1955 DOA: 05/17/2017  PCP: Patient, No Pcp Per  Admit date: 05/17/2017 Discharge date: 05/20/2017  Admitted From: Home  Disposition:  [place]: N/A  Equipment/Devices: None  Discharge Condition: Poor, declining  CODE STATUS: DO NOT RESUSCITATE Diet recommendation: Comfort measures  Brief/Interim Summary: Mr. Ndiaye is a 62 yo M with history stroke with residual right sided hemiparesis and aphasia, bilateral AKA with chronic sacral pressure ulcer, severe protein calorie malnutrition, chronic diastolic CHF, diabetes, and Hypertension who presented after developing persistent hypogylcemia at home as well as vomiting and fever.  Started on broad spectrum antibiotics for presumed aspiration pneumonia vs UTI.   Fever Urine catheter changed, urine sent for culture.  CXR without overt signs of pneumonia.  Patient started on vancomycin and Zosyn and IV fluids. Completed 4 days antibiotics without improvement.  No further fever.  Antibiotics discontinued at discharge.   Hypoglycemia Resolved with D5 infusion.  Failure to thrive Patient was enrolled in Hospice about three months ago due to his bed bound status, chronic pressure ulcer and weight loss.  He has progressively declined per family, and in the last week has not been eating.  During this hospitalization, he has had only a few mouthfuls of food, and sips of liquids.  Per nursing and daughter, his oral intake is declining further, despite treatment with antibiotics and fluids, and his mentation has not improved.  Given this, it is my opinion that he is actively dying, and would benefit from residential hospice transfer.       Discharge Diagnoses:  Principal Problem:   Failure to thrive in adult Active Problems:   Diabetes type 2, uncontrolled (HCC)   Hypertension   Chronic diastolic CHF (congestive heart failure) ([place])   Coronary artery disease  involving native coronary artery of native heart without angina pectoris   Pressure injury of buttock, unstageable ([place])   History of CVA (cerebrovascular accident)   Fever    Discharge Instructions   Allergies as of 05/20/2017      Reactions   Iohexol Hives   Ivp Dye [iodinated Diagnostic Agents] Hives      Medication List    STOP taking these medications   albuterol (2.5 MG/3ML) 0.083% nebulizer solution Commonly known as:  PROVENTIL   carvedilol 25 MG tablet Commonly known as:  COREG   cloNIDine 0.3 MG tablet Commonly known as:  CATAPRES   furosemide 40 MG tablet Commonly known as:  LASIX   insulin aspart 100 UNIT/ML FlexPen Commonly known as:  NOVOLOG   isosorbide mononitrate 120 MG 24 hr tablet Commonly known as:  IMDUR   oxybutynin 5 MG tablet Commonly known as:  DITROPAN   pantoprazole 40 MG tablet Commonly known as:  PROTONIX   QUEtiapine 25 MG tablet Commonly known as:  SEROQUEL   RESOURCE THICKENUP CLEAR Powd   senna-docusate 8.6-50 MG tablet Commonly known as:  Senokot-S   triamcinolone cream 0.5 % Commonly known as:  KENALOG     TAKE these medications   mirtazapine 7.5 MG tablet Commonly known as:  REMERON Take 1 tablet (7.5 mg total) by mouth at bedtime. What changed:  how much to take   oxyCODONE 5 MG immediate release tablet Commonly known as:  Oxy IR/ROXICODONE Take 5 mg by mouth every 6 (six) hours as needed for severe pain.       Allergies  Allergen Reactions  . Iohexol Hives  . Ivp Dye [  Iodinated Diagnostic Agents] Hives    Consultations:  None   Procedures/Studies: Dg Chest Port 1 View  Result Date: 05/19/2017 CLINICAL DATA:  Pneumonia. EXAM: PORTABLE CHEST 1 VIEW COMPARISON:  05/17/2017. FINDINGS: Poor inspiration. Grossly normal sized heart. Clear lungs with normal vascularity. Mild bilateral shoulder degenerative changes. Cholecystectomy clips. IMPRESSION: No acute abnormality. Electronically Signed   By: Claudie Revering M.D.   On: 05/19/2017 11:16   Dg Chest Port 1 View  Result Date: 05/17/2017 CLINICAL DATA:  Altered mental status. Failure to thrive. Patient is in hospice. EXAM: PORTABLE CHEST 1 VIEW COMPARISON:  One-view chest x-ray 08/18/2016. FINDINGS: Heart size normal. Lung volumes are low. There is no edema or effusion. No focal airspace disease is present. IMPRESSION: No acute cardiopulmonary disease or significant interval change. Electronically Signed   By: San Morelle M.D.   On: 05/17/2017 18:14   Dg Swallowing Func-speech Pathology  Result Date: 05/19/2017 Objective Swallowing Evaluation: Type of Study: MBS-Modified Barium Swallow Study  Patient Details Name: HOYT LEANOS MRN: [id number] Date of Birth: Oct 13, 1955 Today's Date: 05/19/2017 Time: SLP Start Time (ACUTE ONLY): 1340 -SLP Stop Time (ACUTE ONLY): 1415 SLP Time Calculation (min) (ACUTE ONLY): 35 min Past Medical History: Past Medical History: Diagnosis Date . Adrenal insufficiency ([place])  . Allergy  . Arthritis  . Blind left eye  . Carpal tunnel syndrome  . Cataract   bil removed . CHF (congestive heart failure) ([place])  . Diabetes mellitus without complication ([place])  . ED (erectile dysfunction)  . Gout  . Herpes  . Hypercholesterolemia  . Hypertension  . MI (myocardial infarction) ([place])  . Nausea & vomiting  . Peripheral neuropathy  . Pneumonia  . PVD (peripheral vascular disease) ([place])  Past Surgical History: Past Surgical History: Procedure Laterality Date . ABDOMINAL AORTOGRAM W/LOWER EXTREMITY N/A 06/19/2016  Procedure: Abdominal Aortogram w/Lower Extremity;  Surgeon: Serafina Mitchell, MD;  Location: [facility];  Service: Cardiovascular;  Laterality: N/A; . AMPUTATION Bilateral 08/16/2016  Procedure: BILATERAL AMPUTATION ABOVE KNEE;  Surgeon: Serafina Mitchell, MD;  Location: [facility];  Service: Vascular;  Laterality: Bilateral; . CHOLECYSTECTOMY   . CORONARY ANGIOPLASTY WITH STENT PLACEMENT  2007  two 2.5  x 13 mm Cypher stents to the RCA  . dilatera cataracts removed   . right knee surgary   HPI: EDILBERTO ROOSEVELT is a 62 y.o. male with history of diabetes mellitus type 2, CVA with right-sided hemiparesis and difficulty speaking, CAD, hypertension, bilateral lower extremity amputation, blind left eye, CHF, and DVT was brought to the ER after patient was found to be hypoglycemic and has been having recurrent episodes of vomiting. Admitted with hypoglycemia with failure to thrive, fever with concern for possible aspiration. Pt has a history of dysphagia; MBS 04/25/16 recommended conversative diet of dysphagia 2 with necatr thick liquids with supervised trials of thin liquids due to mild to moderate oral phase deficits (prolonged preparation and piecemealing) and delayed swallow initiation to pyriform sinuses. No aspiration observed on exam, however clinically pt continued to have coughing and vocal changes with thin liquids. CXR with no acute findings, however pt has been febrile.  Subjective: Pt arrives to [place] with daughter and her husband Assessment / Plan / Recommendation CHL IP CLINICAL IMPRESSIONS 05/19/2017 Clinical Impression Patient presents with moderate-severe oropharyngeal dysphagia due to oral and base of tongue weakness; pt's swallow function is primarily impacted by sensory and cognitive impairments. Oral stage characterized by mild anterior spillage, prolonged oral transit, decreased lingual  manipulation with at times passive transfer of boluses into the pharynx. Pt occasionally lifts head into neck extension in apparent effort to aid transit. Pt has minimal ability or willingness to participate in self-feeding. There is prolonged pooling in the valleculae (puree) and pyriform sinuses (liquids). Pt without awareness and even with max cues (dry spoon, verbal, tactile, encouragement from daughter), swallow initiation was delayed ranging from 20 seconds to more than 2 minutes with boluses fed by clinician. Despite prolonged pooling there is no  penetration or aspiration before the swallow, but the only effective means of cuing pt to initate a swallow was to offer an additional straw sip of liquid. When pt retrieves boluses himself via straw, swallow initiation is more timely. Unfortunately there is penetration and trace silent aspiration during the swallow with large straw sips of thin and nectar thick liquids due to delayed closure of the laryngeal vestibule. Mild vallecular residue due to base of tongue weakness. Pt is at risk for aspiration of all consistencies. Pt's daughter present and we discussed findings at length as well as goals of care. She verbalizes desire for pt to be comfortable and to continue eating and drinking by mouth vs a feeding tube. At the end of the exam, pt began asking for water. Pt has been consuming soft solids and nectar thick liquids, and daughter feels this is a comfortable diet for him, though his intake has been progressively declining. I educated pt's daughter re: aspiration risk regardless of consistency and discussed progressing to thin vs nectar thick liquids. Daughter would like additional time to consider advancement of liquids with meals, but would like to initiate free water protocol, allowing pt to have regular, thin water between meals after oral care.  SLP Visit Diagnosis Dysphagia, oropharyngeal phase (R13.12) Attention and concentration deficit following -- Frontal lobe and executive function deficit following -- Impact on safety and function Moderate aspiration risk;Severe aspiration risk   CHL IP TREATMENT RECOMMENDATION 05/19/2017 Treatment Recommendations Therapy as outlined in treatment plan below   Prognosis 05/19/2017 Prognosis for Safe Diet Advancement Fair Barriers to Reach Goals Cognitive deficits;Time post onset Barriers/Prognosis Comment -- CHL IP DIET RECOMMENDATION 05/19/2017 SLP Diet Recommendations Dysphagia 2 (Fine chop) solids;Nectar thick liquid;Thin liquid Liquid Administration via Straw  Medication Administration Crushed with puree Compensations Minimize environmental distractions;Slow rate;Small sips/bites Postural Changes --   CHL IP OTHER RECOMMENDATIONS 05/19/2017 Recommended Consults -- Oral Care Recommendations Oral care before and after PO Other Recommendations --   CHL IP FOLLOW UP RECOMMENDATIONS 05/19/2017 Follow up Recommendations Other (comment)   CHL IP FREQUENCY AND DURATION 05/19/2017 Speech Therapy Frequency (ACUTE ONLY) min 1 x/week Treatment Duration 1 week      CHL IP ORAL PHASE 05/19/2017 Oral Phase Impaired Oral - Pudding Teaspoon -- Oral - Pudding Cup -- Oral - Honey Teaspoon -- Oral - Honey Cup -- Oral - Nectar Teaspoon Right anterior bolus loss;Weak lingual manipulation;Reduced posterior propulsion;Holding of bolus;Delayed oral transit Oral - Nectar Cup Weak lingual manipulation;Reduced posterior propulsion;Holding of bolus;Delayed oral transit Oral - Nectar Straw Weak lingual manipulation;Reduced posterior propulsion;Holding of bolus;Delayed oral transit Oral - Thin Teaspoon Weak lingual manipulation;Reduced posterior propulsion;Holding of bolus;Delayed oral transit Oral - Thin Cup -- Oral - Thin Straw Weak lingual manipulation;Reduced posterior propulsion;Holding of bolus;Delayed oral transit Oral - Puree Weak lingual manipulation;Reduced posterior propulsion;Holding of bolus;Delayed oral transit Oral - Mech Soft -- Oral - Regular -- Oral - Multi-Consistency -- Oral - Pill -- Oral Phase - Comment --  CHL IP PHARYNGEAL PHASE  05/19/2017 Pharyngeal Phase Impaired Pharyngeal- Pudding Teaspoon -- Pharyngeal -- Pharyngeal- Pudding Cup -- Pharyngeal -- Pharyngeal- Honey Teaspoon (No Data) Pharyngeal -- Pharyngeal- Honey Cup -- Pharyngeal -- Pharyngeal- Nectar Teaspoon (No Data) Pharyngeal -- Pharyngeal- Nectar Cup Delayed swallow initiation-pyriform sinuses;Reduced airway/laryngeal closure;Reduced tongue base retraction;Pharyngeal residue - valleculae;Penetration/Aspiration during  swallow Pharyngeal Material enters airway, remains ABOVE vocal cords and not ejected out Pharyngeal- Nectar Straw Delayed swallow initiation-pyriform sinuses;Reduced airway/laryngeal closure;Reduced tongue base retraction;Trace aspiration;Penetration/Aspiration during swallow;Pharyngeal residue - valleculae Pharyngeal Material enters airway, remains ABOVE vocal cords then ejected out;Material enters airway, passes BELOW cords without attempt by patient to eject out (silent aspiration) Pharyngeal- Thin Teaspoon (No Data) Pharyngeal -- Pharyngeal- Thin Cup -- Pharyngeal -- Pharyngeal- Thin Straw Delayed swallow initiation-pyriform sinuses;Reduced airway/laryngeal closure;Penetration/Aspiration during swallow;Trace aspiration;Pharyngeal residue - valleculae Pharyngeal Material enters airway, passes BELOW cords without attempt by patient to eject out (silent aspiration);Material enters airway, remains ABOVE vocal cords then ejected out Pharyngeal- Puree Delayed swallow initiation-vallecula Pharyngeal -- Pharyngeal- Mechanical Soft -- Pharyngeal -- Pharyngeal- Regular -- Pharyngeal -- Pharyngeal- Multi-consistency -- Pharyngeal -- Pharyngeal- Pill -- Pharyngeal -- Pharyngeal Comment --  CHL IP CERVICAL ESOPHAGEAL PHASE 05/19/2017 Cervical Esophageal Phase WFL Pudding Teaspoon -- Pudding Cup -- Honey Teaspoon -- Honey Cup -- Nectar Teaspoon -- Nectar Cup -- Nectar Straw -- Thin Teaspoon -- Thin Cup -- Thin Straw -- Puree -- Mechanical Soft -- Regular -- Multi-consistency -- Pill -- Cervical Esophageal Comment -- CHL IP GO 05/11/2016 Functional Assessment Tool Used clinical judgment Functional Limitations Swallowing Swallow Current Status (J4782) CK Swallow Goal Status (N5621) CJ Swallow Discharge Status (H0865) (None) Motor Speech Current Status (H8469) (None) Motor Speech Goal Status (G2952) (None) Motor Speech Goal Status (W4132) (None) Spoken Language Comprehension Current Status (G4010) (None) Spoken Language  Comprehension Goal Status (U7253) (None) Spoken Language Comprehension Discharge Status (G6440) (None) Spoken Language Expression Current Status (H4742) (None) Spoken Language Expression Goal Status (V9563) (None) Spoken Language Expression Discharge Status [phone]) (None) Attention Current Status (P3295) (None) Attention Goal Status (J8841) (None) Attention Discharge Status (Y6063) (None) Memory Current Status (K1601) (None) Memory Goal Status (U9323) (None) Memory Discharge Status (F5732) (None) Voice Current Status (K0254) (None) Voice Goal Status (Y7062) (None) Voice Discharge Status (B7628) (None) Other Speech-Language Pathology Functional Limitation Current Status (B1517) (None) Other Speech-Language Pathology Functional Limitation Goal Status (O1607) (None) Other Speech-Language Pathology Functional Limitation Discharge Status [phone]) (None) Deneise Lever, MS, CCC-SLP Speech-Language Pathologist [phone] Aliene Altes 05/19/2017, 4:42 PM                 Subjective: Patient nonverbal.  He groans and nods to daughter's questions, but is not reliably intentional in his utterances.  No new fever.  Did not eat solid food yesterday or today.  Discharge Exam: Vitals:   05/20/17 0421 05/20/17 1319  BP: (!) 161/68 (!) 180/83  Pulse: 81 80  Resp: 16 18  Temp: 98.3 F (36.8 C) 99 F (37.2 C)  SpO2: 98% 98%   Vitals:   05/19/17 0636 05/19/17 2017 05/20/17 0421 05/20/17 1319  BP: (!) 150/91 (!) 185/75 (!) 161/68 (!) 180/83  Pulse: 97 82 81 80  Resp:  16 16 18   Temp:  99.5 F (37.5 C) 98.3 F (36.8 C) 99 F (37.2 C)  TempSrc:  Oral Oral Oral  SpO2:  100% 98% 98%  Weight:      Height:        General: Pt is awake, sometimes makes eye contact.  Does not respond to  questions. Cardiovascular: RRR, S1/S2 +  Respiratory: CTA bilaterally  Extremities: bilateral AKA    The results of significant diagnostics from this hospitalization (including imaging, microbiology, ancillary and  laboratory) are listed below for reference.     Microbiology: Recent Results (from the past 240 hour(s))  Blood Culture (routine x 2)     Status: None (Preliminary result)   Collection Time: 05/17/17  6:51 PM  Result Value Ref Range Status   Specimen Description BLOOD ARM RIGHT  Final   Special Requests IN PEDIATRIC BOTTLE Blood Culture adequate volume  Final   Culture   Final    NO GROWTH 3 DAYS Performed at [facility], [address] [place], [place] [address]    Report Status PENDING  Incomplete  Blood Culture (routine x 2)     Status: None (Preliminary result)   Collection Time: 05/17/17  8:15 PM  Result Value Ref Range Status   Specimen Description BLOOD LEFT ANTECUBITAL  Final   Special Requests   Final    BOTTLES DRAWN AEROBIC AND ANAEROBIC Blood Culture adequate volume   Culture   Final    NO GROWTH 3 DAYS Performed at [facility], [place] [address], [place], [place] [address]    Report Status PENDING  Incomplete  MRSA PCR Screening     Status: None   Collection Time: 05/19/17 11:27 AM  Result Value Ref Range Status   MRSA by PCR NEGATIVE NEGATIVE Final    Comment:        The GeneXpert MRSA Assay (FDA approved for NASAL specimens only), is one component of a comprehensive MRSA colonization surveillance program. It is not intended to diagnose MRSA infection nor to guide or monitor treatment for MRSA infections. Performed at [facility], [place] [address], [place], [place] [address]      Labs: BNP (last 3 results) No results for input(s): BNP in the last 8760 hours. Basic Metabolic Panel: Recent Labs  Lab 05/17/17 1742 05/18/17 0652 05/18/17 1818 05/19/17 0516  NA 139 141  --  143  K 3.2* 2.9*  --  3.0*  CL 97* 100*  --  103  CO2 28 26  --  26  GLUCOSE 44* 124*  --  272*  BUN 11 10  --  8  CREATININE 0.61 0.56*  --  0.56*  CALCIUM 9.2 8.3*  --  8.2*  MG  --   --  1.8  --    Liver Function Tests: Recent Labs  Lab 05/17/17 1742   AST 19  ALT 5*  ALKPHOS 87  BILITOT 0.4  PROT 8.8*  ALBUMIN 2.5*   No results for input(s): LIPASE, AMYLASE in the last 168 hours. No results for input(s): AMMONIA in the last 168 hours. CBC: Recent Labs  Lab 05/17/17 1742 05/18/17 0656 05/19/17 0516  WBC 13.4* 11.5* 11.0*  NEUTROABS 11.5*  --   --   HGB 13.0 11.9* 11.6*  HCT 39.8 37.1* 36.9*  MCV 84.0 84.3 85.8  PLT 572* 532* 560*   Cardiac Enzymes: No results for input(s): CKTOTAL, CKMB, CKMBINDEX, TROPONINI in the last 168 hours. BNP: Invalid input(s): POCBNP CBG: Recent Labs  Lab 05/19/17 2011 05/20/17 0029 05/20/17 0420 05/20/17 0746 05/20/17 1142  GLUCAP 234* 212* 197* 224* 247*   D-Dimer No results for input(s): DDIMER in the last 72 hours. Hgb A1c No results for input(s): HGBA1C in the last 72 hours. Lipid Profile No results for input(s): CHOL, HDL, LDLCALC,  TRIG, CHOLHDL, LDLDIRECT in the last 72 hours. Thyroid function studies No results for input(s): TSH, T4TOTAL, T3FREE, THYROIDAB in the last 72 hours.  Invalid input(s): FREET3 Anemia work up No results for input(s): VITAMINB12, FOLATE, FERRITIN, TIBC, IRON, RETICCTPCT in the last 72 hours. Urinalysis    Component Value Date/Time   COLORURINE YELLOW 05/18/2017 1059   APPEARANCEUR CLOUDY (A) 05/18/2017 1059   LABSPEC 1.018 05/18/2017 1059   PHURINE 6.0 05/18/2017 1059   GLUCOSEU NEGATIVE 05/18/2017 1059   HGBUR LARGE (A) 05/18/2017 1059   BILIRUBINUR NEGATIVE 05/18/2017 1059   KETONESUR NEGATIVE 05/18/2017 1059   PROTEINUR 30 (A) 05/18/2017 1059   UROBILINOGEN 0.2 07/12/2014 1228   NITRITE NEGATIVE 05/18/2017 1059   LEUKOCYTESUR LARGE (A) 05/18/2017 1059   Sepsis Labs Invalid input(s): PROCALCITONIN,  WBC,  LACTICIDVEN Microbiology Recent Results (from the past 240 hour(s))  Blood Culture (routine x 2)     Status: None (Preliminary result)   Collection Time: 05/17/17  6:51 PM  Result Value Ref Range Status   Specimen Description  BLOOD ARM RIGHT  Final   Special Requests IN PEDIATRIC BOTTLE Blood Culture adequate volume  Final   Culture   Final    NO GROWTH 3 DAYS Performed at [facility], [place] [address], [place], [place] [address]    Report Status PENDING  Incomplete  Blood Culture (routine x 2)     Status: None (Preliminary result)   Collection Time: 05/17/17  8:15 PM  Result Value Ref Range Status   Specimen Description BLOOD LEFT ANTECUBITAL  Final   Special Requests   Final    BOTTLES DRAWN AEROBIC AND ANAEROBIC Blood Culture adequate volume   Culture   Final    NO GROWTH 3 DAYS Performed at [facility], [place] [address] [place], [place] [address]    Report Status PENDING  Incomplete  MRSA PCR Screening     Status: None   Collection Time: 05/19/17 11:27 AM  Result Value Ref Range Status   MRSA by PCR NEGATIVE NEGATIVE Final    Comment:        The GeneXpert MRSA Assay (FDA approved for NASAL specimens only), is one component of a comprehensive MRSA colonization surveillance program. It is not intended to diagnose MRSA infection nor to guide or monitor treatment for MRSA infections. Performed at [facility], [place] [address], [place], [place] [address]      Time coordinating discharge: Over  30 minutes  SIGNED:   Edwin Dada, MD  [facility] 05/20/2017, 1:21 PM

## 2017-05-21 DIAGNOSIS — I69951 Hemiplegia and hemiparesis following unspecified cerebrovascular disease affecting right dominant side: Secondary | ICD-10-CM | POA: Diagnosis not present

## 2017-05-21 DIAGNOSIS — I679 Cerebrovascular disease, unspecified: Secondary | ICD-10-CM | POA: Diagnosis not present

## 2017-05-21 DIAGNOSIS — I672 Cerebral atherosclerosis: Secondary | ICD-10-CM | POA: Diagnosis not present

## 2017-05-21 DIAGNOSIS — I10 Essential (primary) hypertension: Secondary | ICD-10-CM | POA: Diagnosis not present

## 2017-05-21 DIAGNOSIS — F0150 Vascular dementia without behavioral disturbance: Secondary | ICD-10-CM | POA: Diagnosis not present

## 2017-05-21 DIAGNOSIS — I739 Peripheral vascular disease, unspecified: Secondary | ICD-10-CM | POA: Diagnosis not present

## 2017-05-22 DIAGNOSIS — I69951 Hemiplegia and hemiparesis following unspecified cerebrovascular disease affecting right dominant side: Secondary | ICD-10-CM | POA: Diagnosis not present

## 2017-05-22 DIAGNOSIS — I679 Cerebrovascular disease, unspecified: Secondary | ICD-10-CM | POA: Diagnosis not present

## 2017-05-22 DIAGNOSIS — F0150 Vascular dementia without behavioral disturbance: Secondary | ICD-10-CM | POA: Diagnosis not present

## 2017-05-22 DIAGNOSIS — I672 Cerebral atherosclerosis: Secondary | ICD-10-CM | POA: Diagnosis not present

## 2017-05-22 DIAGNOSIS — I10 Essential (primary) hypertension: Secondary | ICD-10-CM | POA: Diagnosis not present

## 2017-05-22 DIAGNOSIS — I739 Peripheral vascular disease, unspecified: Secondary | ICD-10-CM | POA: Diagnosis not present

## 2017-05-22 LAB — CULTURE, BLOOD (ROUTINE X 2)
CULTURE: NO GROWTH
Culture: NO GROWTH
SPECIAL REQUESTS: ADEQUATE
Special Requests: ADEQUATE

## 2017-05-22 NOTE — Consult Note (Signed)
            [facility] CM Primary Care Navigator  05/22/2017  [name] [birth year] [id number]  Attempt to see patient at the bedside to identify possible discharge needs but staff reports that patient was already discharged. Patient was discharged to [facility] residential hospice.  Per MD note, patient's condition is poor and progressively declining. He had been transitioned to comfort care. Patient is actively dying, and would benefit from residential hospice transfer.  At end of life care, no further health management neededat this point.   For additional questions please contact:  Edwena Felty A. Yanely Mast, BSN, RN-BC [facility] PRIMARY CARE Navigator Cell: [phone]

## 2017-05-23 DIAGNOSIS — I679 Cerebrovascular disease, unspecified: Secondary | ICD-10-CM | POA: Diagnosis not present

## 2017-05-23 DIAGNOSIS — I10 Essential (primary) hypertension: Secondary | ICD-10-CM | POA: Diagnosis not present

## 2017-05-23 DIAGNOSIS — I739 Peripheral vascular disease, unspecified: Secondary | ICD-10-CM | POA: Diagnosis not present

## 2017-05-23 DIAGNOSIS — I672 Cerebral atherosclerosis: Secondary | ICD-10-CM | POA: Diagnosis not present

## 2017-05-23 DIAGNOSIS — I69951 Hemiplegia and hemiparesis following unspecified cerebrovascular disease affecting right dominant side: Secondary | ICD-10-CM | POA: Diagnosis not present

## 2017-05-23 DIAGNOSIS — F0150 Vascular dementia without behavioral disturbance: Secondary | ICD-10-CM | POA: Diagnosis not present

## 2017-05-24 DIAGNOSIS — I739 Peripheral vascular disease, unspecified: Secondary | ICD-10-CM | POA: Diagnosis not present

## 2017-05-24 DIAGNOSIS — F0150 Vascular dementia without behavioral disturbance: Secondary | ICD-10-CM | POA: Diagnosis not present

## 2017-05-24 DIAGNOSIS — I679 Cerebrovascular disease, unspecified: Secondary | ICD-10-CM | POA: Diagnosis not present

## 2017-05-24 DIAGNOSIS — I69951 Hemiplegia and hemiparesis following unspecified cerebrovascular disease affecting right dominant side: Secondary | ICD-10-CM | POA: Diagnosis not present

## 2017-05-24 DIAGNOSIS — I10 Essential (primary) hypertension: Secondary | ICD-10-CM | POA: Diagnosis not present

## 2017-05-24 DIAGNOSIS — I672 Cerebral atherosclerosis: Secondary | ICD-10-CM | POA: Diagnosis not present

## 2017-05-25 DIAGNOSIS — I10 Essential (primary) hypertension: Secondary | ICD-10-CM | POA: Diagnosis not present

## 2017-05-25 DIAGNOSIS — I69951 Hemiplegia and hemiparesis following unspecified cerebrovascular disease affecting right dominant side: Secondary | ICD-10-CM | POA: Diagnosis not present

## 2017-05-25 DIAGNOSIS — I672 Cerebral atherosclerosis: Secondary | ICD-10-CM | POA: Diagnosis not present

## 2017-05-25 DIAGNOSIS — I679 Cerebrovascular disease, unspecified: Secondary | ICD-10-CM | POA: Diagnosis not present

## 2017-05-25 DIAGNOSIS — F0150 Vascular dementia without behavioral disturbance: Secondary | ICD-10-CM | POA: Diagnosis not present

## 2017-05-25 DIAGNOSIS — I739 Peripheral vascular disease, unspecified: Secondary | ICD-10-CM | POA: Diagnosis not present

## 2017-05-26 DIAGNOSIS — I10 Essential (primary) hypertension: Secondary | ICD-10-CM | POA: Diagnosis not present

## 2017-05-26 DIAGNOSIS — I679 Cerebrovascular disease, unspecified: Secondary | ICD-10-CM | POA: Diagnosis not present

## 2017-05-26 DIAGNOSIS — I739 Peripheral vascular disease, unspecified: Secondary | ICD-10-CM | POA: Diagnosis not present

## 2017-05-26 DIAGNOSIS — I672 Cerebral atherosclerosis: Secondary | ICD-10-CM | POA: Diagnosis not present

## 2017-05-26 DIAGNOSIS — F0150 Vascular dementia without behavioral disturbance: Secondary | ICD-10-CM | POA: Diagnosis not present

## 2017-05-26 DIAGNOSIS — I69951 Hemiplegia and hemiparesis following unspecified cerebrovascular disease affecting right dominant side: Secondary | ICD-10-CM | POA: Diagnosis not present

## 2017-05-27 DIAGNOSIS — I69951 Hemiplegia and hemiparesis following unspecified cerebrovascular disease affecting right dominant side: Secondary | ICD-10-CM | POA: Diagnosis not present

## 2017-05-27 DIAGNOSIS — H409 Unspecified glaucoma: Secondary | ICD-10-CM | POA: Diagnosis not present

## 2017-05-27 DIAGNOSIS — I2510 Atherosclerotic heart disease of native coronary artery without angina pectoris: Secondary | ICD-10-CM | POA: Diagnosis not present

## 2017-05-27 DIAGNOSIS — I679 Cerebrovascular disease, unspecified: Secondary | ICD-10-CM | POA: Diagnosis not present

## 2017-05-27 DIAGNOSIS — I10 Essential (primary) hypertension: Secondary | ICD-10-CM | POA: Diagnosis not present

## 2017-05-27 DIAGNOSIS — F0150 Vascular dementia without behavioral disturbance: Secondary | ICD-10-CM | POA: Diagnosis not present

## 2017-05-27 DIAGNOSIS — I672 Cerebral atherosclerosis: Secondary | ICD-10-CM | POA: Diagnosis not present

## 2017-05-27 DIAGNOSIS — K219 Gastro-esophageal reflux disease without esophagitis: Secondary | ICD-10-CM | POA: Diagnosis not present

## 2017-05-27 DIAGNOSIS — E1159 Type 2 diabetes mellitus with other circulatory complications: Secondary | ICD-10-CM | POA: Diagnosis not present

## 2017-05-27 DIAGNOSIS — I739 Peripheral vascular disease, unspecified: Secondary | ICD-10-CM | POA: Diagnosis not present

## 2017-05-28 DIAGNOSIS — F0150 Vascular dementia without behavioral disturbance: Secondary | ICD-10-CM | POA: Diagnosis not present

## 2017-05-28 DIAGNOSIS — I672 Cerebral atherosclerosis: Secondary | ICD-10-CM | POA: Diagnosis not present

## 2017-05-28 DIAGNOSIS — I679 Cerebrovascular disease, unspecified: Secondary | ICD-10-CM | POA: Diagnosis not present

## 2017-05-28 DIAGNOSIS — I69951 Hemiplegia and hemiparesis following unspecified cerebrovascular disease affecting right dominant side: Secondary | ICD-10-CM | POA: Diagnosis not present

## 2017-05-28 DIAGNOSIS — I739 Peripheral vascular disease, unspecified: Secondary | ICD-10-CM | POA: Diagnosis not present

## 2017-05-28 DIAGNOSIS — I10 Essential (primary) hypertension: Secondary | ICD-10-CM | POA: Diagnosis not present

## 2017-06-06 NOTE — Progress Notes (Signed)
In response to CDI query, the patient's diagnosis of UTI was ruled out after discharge with negative urine culture.  The possible diagnosis of aspiration pneumonia was neither ruled out nor ruled in.

## 2017-06-26 DEATH — deceased

## 2018-11-29 IMAGING — MR MR HEAD W/O CM
4 series · 48 of 48 positions shown · non-contrast
Comparison: Comparison made with prior MRI from 03/28/2016.

CLINICAL DATA: Initial evaluation for left-sided numbness.

EXAM:
MRI HEAD WITHOUT CONTRAST
TECHNIQUE: Multiplanar, multiecho pulse sequences of the brain and surrounding
structures were obtained without intravenous contrast.

[Series 3: thin 12 nex · axial · 3.0mm · 1.09mm/px · z∈[-87,-6]mm · 17 of 56 slices shown]
[im 1/56]
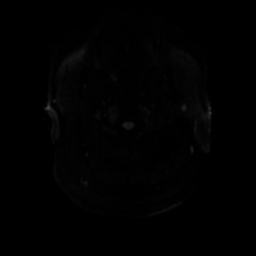
[im 4/56]
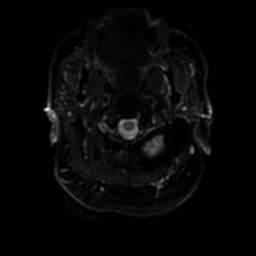
[im 7/56]
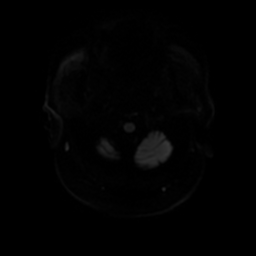
[im 11/56]
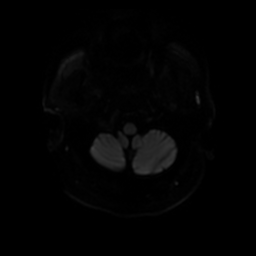
[im 14/56]
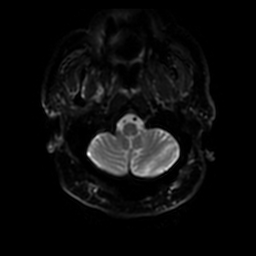
[im 18/56]
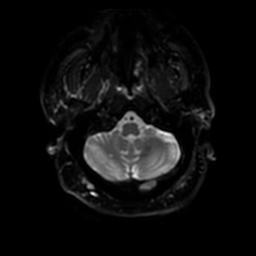
[im 21/56]
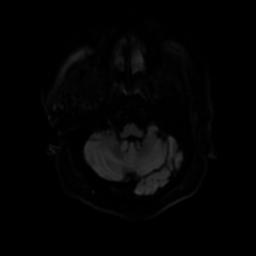
[im 25/56]
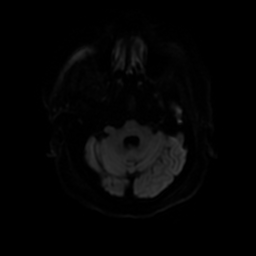
[im 28/56]
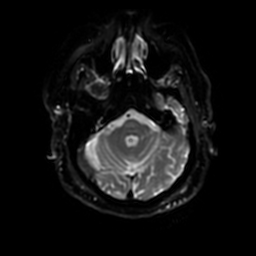
[im 31/56]
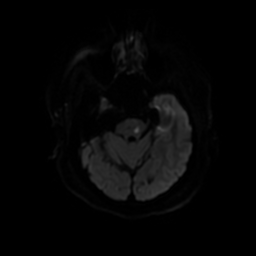
[im 35/56]
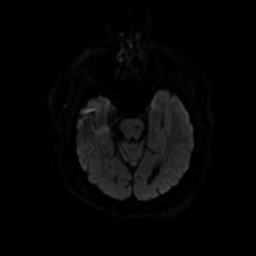
[im 38/56]
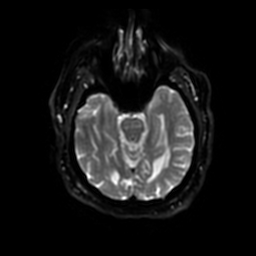
[im 42/56]
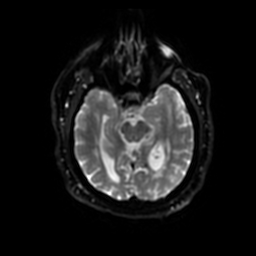
[im 45/56]
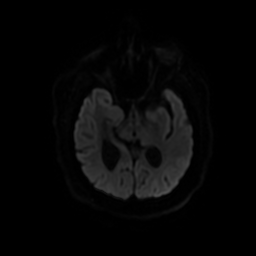
[im 49/56]
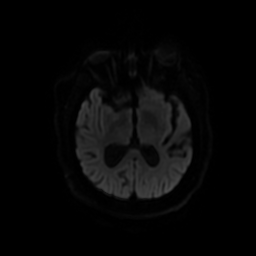
[im 52/56]
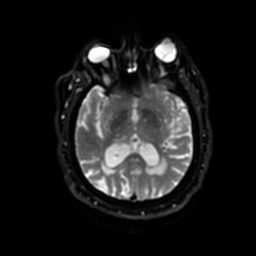
[im 56/56]
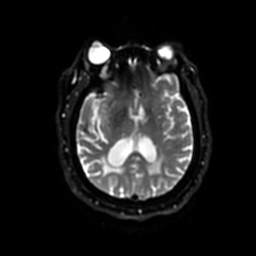

[Series 4: cor thin 12 · coronal · 3.0mm · 1.09mm/px · 15 of 48 slices shown]
[im 1/48]
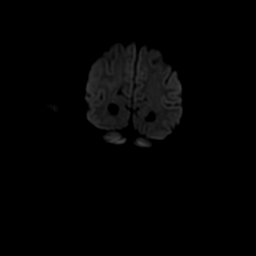
[im 4/48]
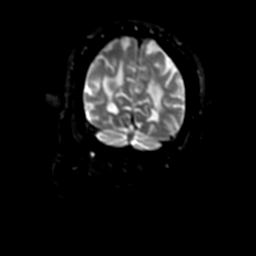
[im 7/48]
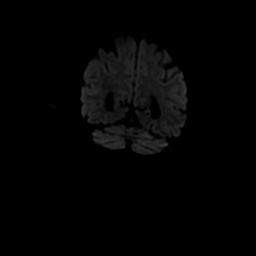
[im 11/48]
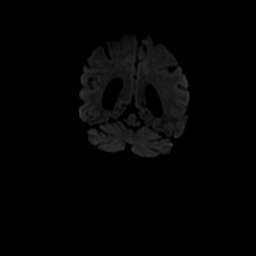
[im 14/48]
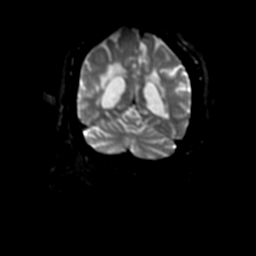
[im 17/48]
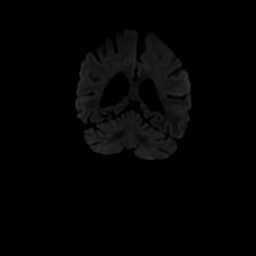
[im 21/48]
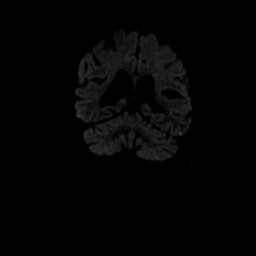
[im 24/48]
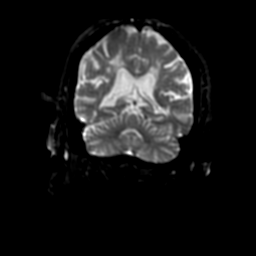
[im 27/48]
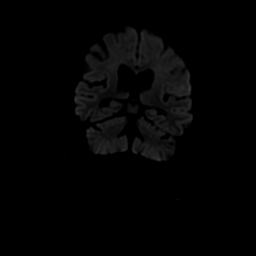
[im 31/48]
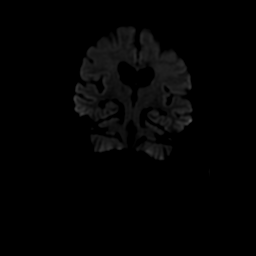
[im 34/48]
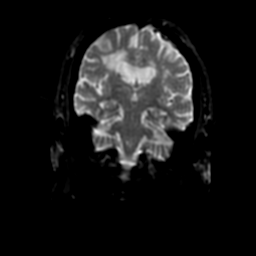
[im 37/48]
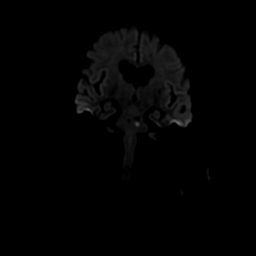
[im 41/48]
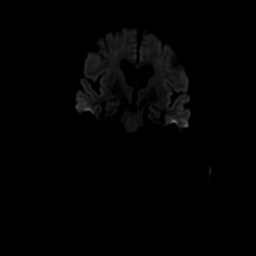
[im 44/48]
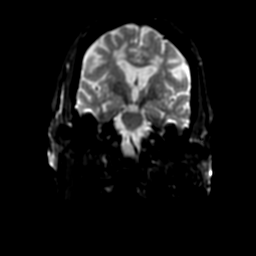
[im 48/48]
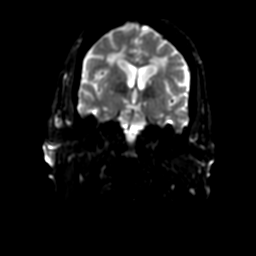

[Series 300: DWI · axial · 3.0mm · 1.09mm/px · z∈[-87,-6]mm · 9 of 28 slices shown (1 of 2)]
[im 1/28]
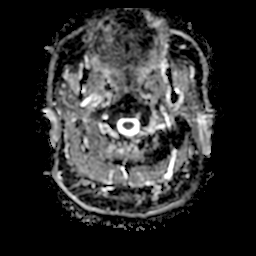
[im 4/28]
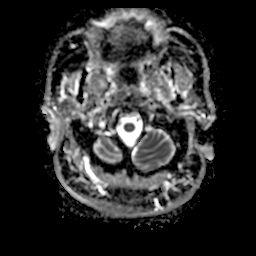
[im 7/28]
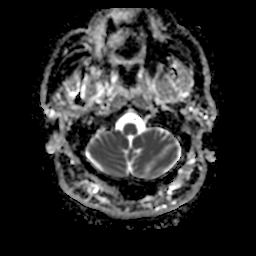
[im 11/28]
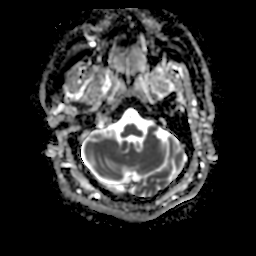
[im 14/28]
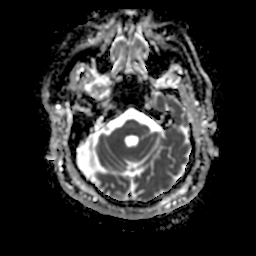
[im 17/28]
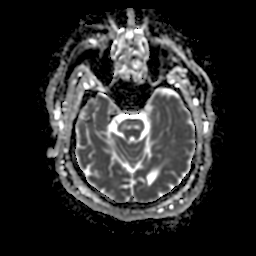
[im 21/28]
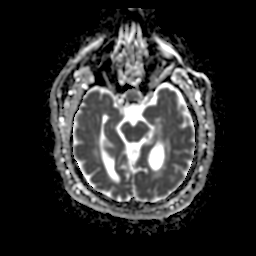
[im 24/28]
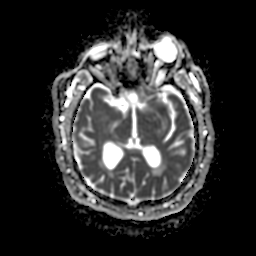
[im 28/28]
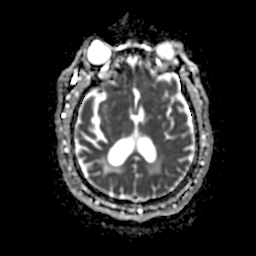

[Series 400: DWI · coronal · 3.0mm · 1.09mm/px · 7 of 24 slices shown (2 of 2)]
[im 1/24]
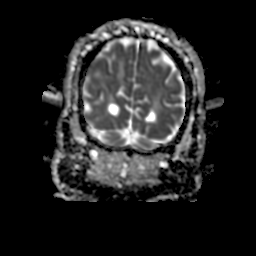
[im 4/24]
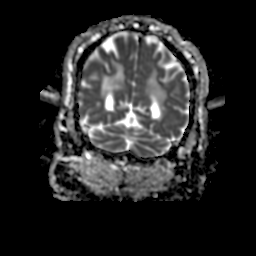
[im 8/24]
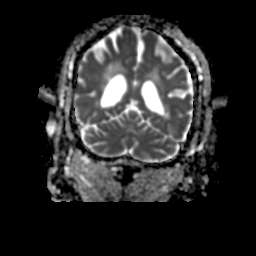
[im 12/24]
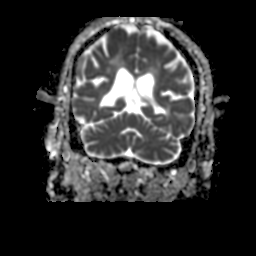
[im 16/24]
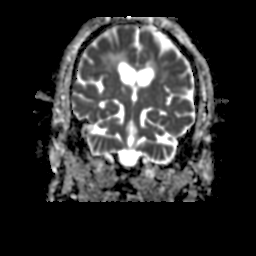
[im 20/24]
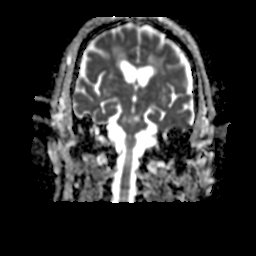
[im 24/24]
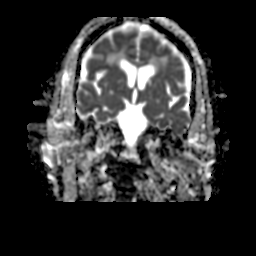

[48 of 48 positions shown; findings below may reference images not displayed]

FINDINGS: Limited thin-section DWI sequences were performed through the
brainstem. Diffusion-weighted imaging demonstrates a small acute
ischemic infarct within the left paramedian pons, measuring 9 mm
(series 3, image 15). This was not clearly evident on previous MRI.
No other evidence for acute or subacute ischemia on the limited
images provided. Adjacent remote lacunar infarcts noted within the
brainstem.
IMPRESSION: 1. 9 mm acute ischemic infarct within the left paramedian pons.
2. Please note that this is a limited study with only thin section
diffusion-weighted imaging performed through the brainstem.

## 2019-01-09 IMAGING — CR DG CHEST 2V
2 series · 2 of 2 positions shown · non-contrast
Comparison: 03/29/2016.

CLINICAL DATA: Weakness.

EXAM:
CHEST  2 VIEW

[chest lat]
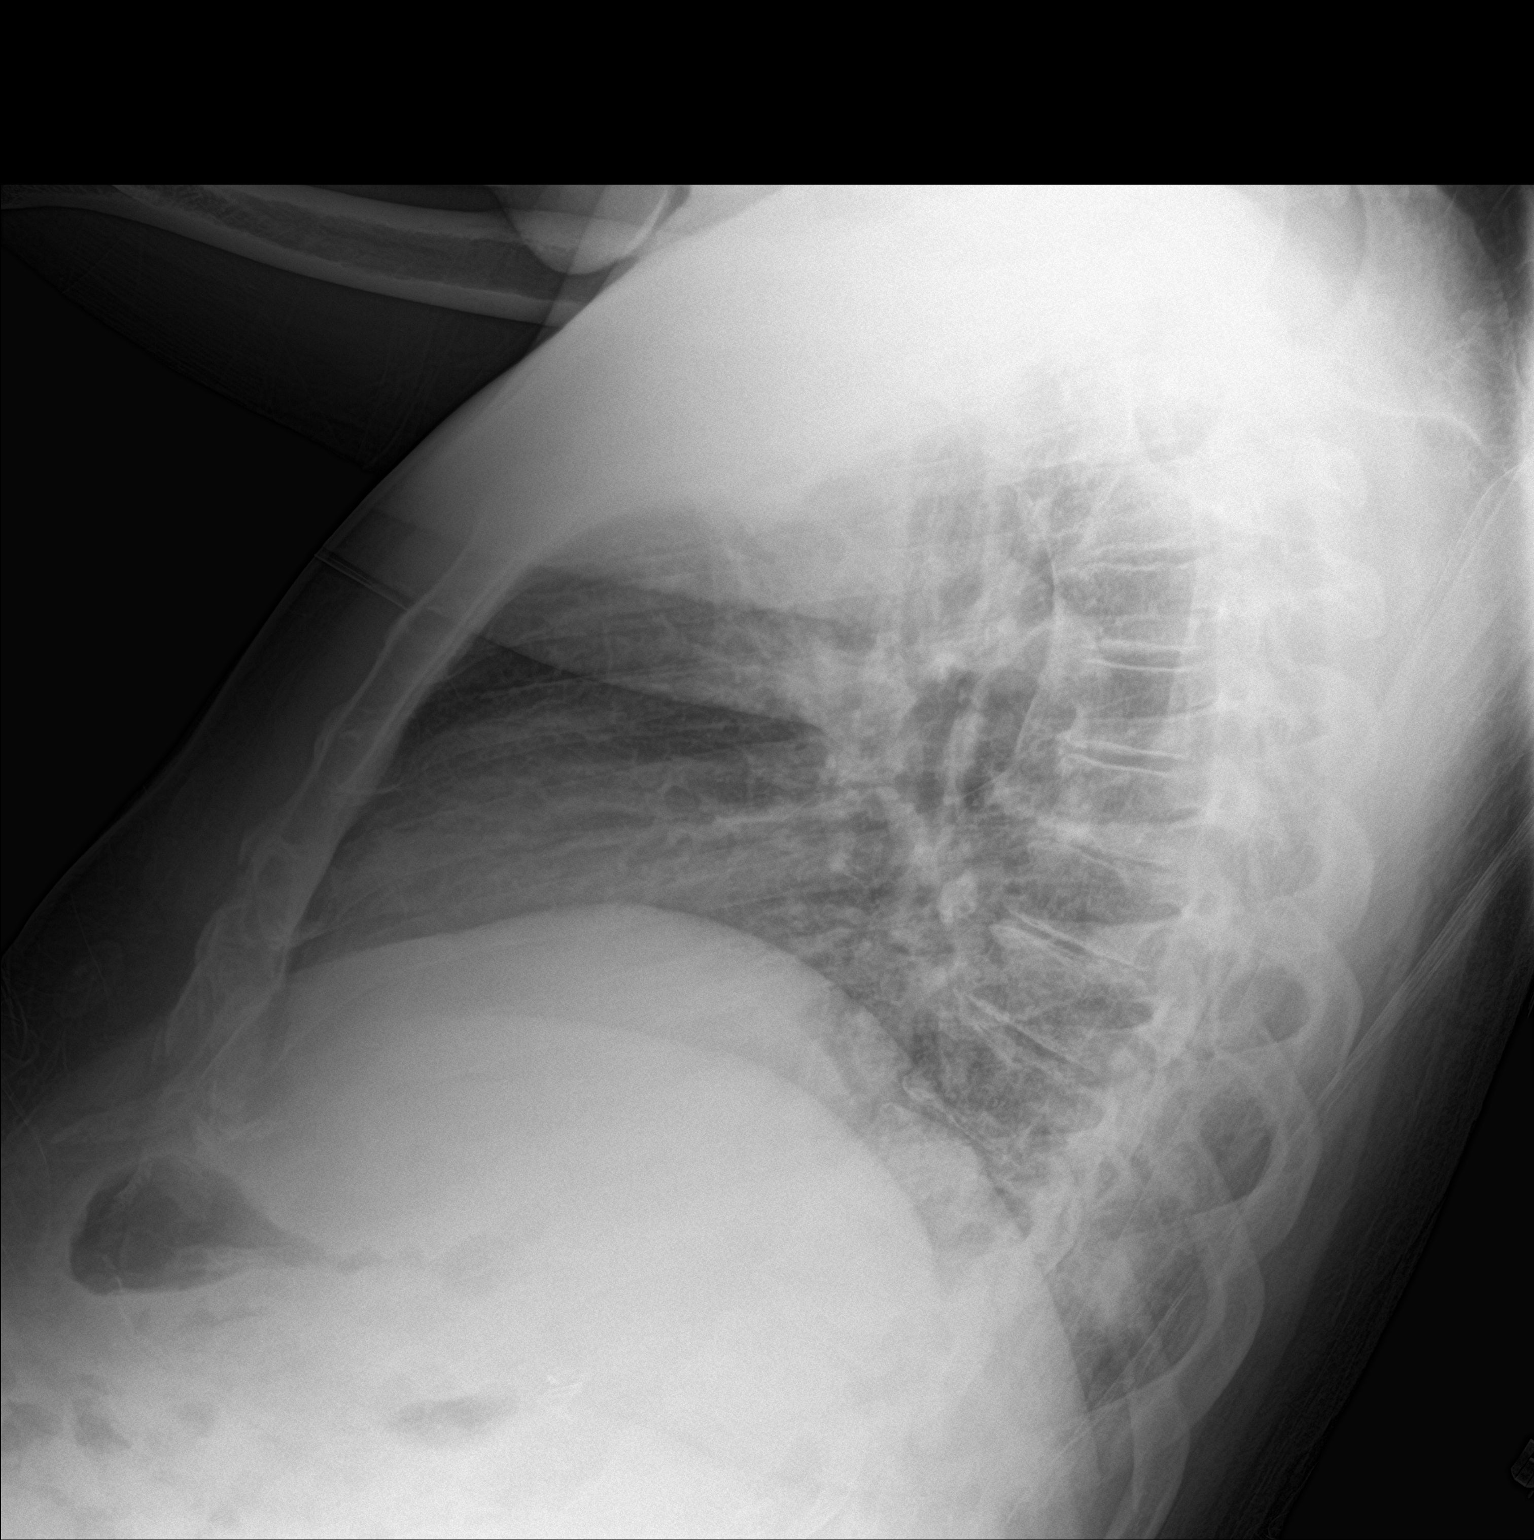

[chest ap]
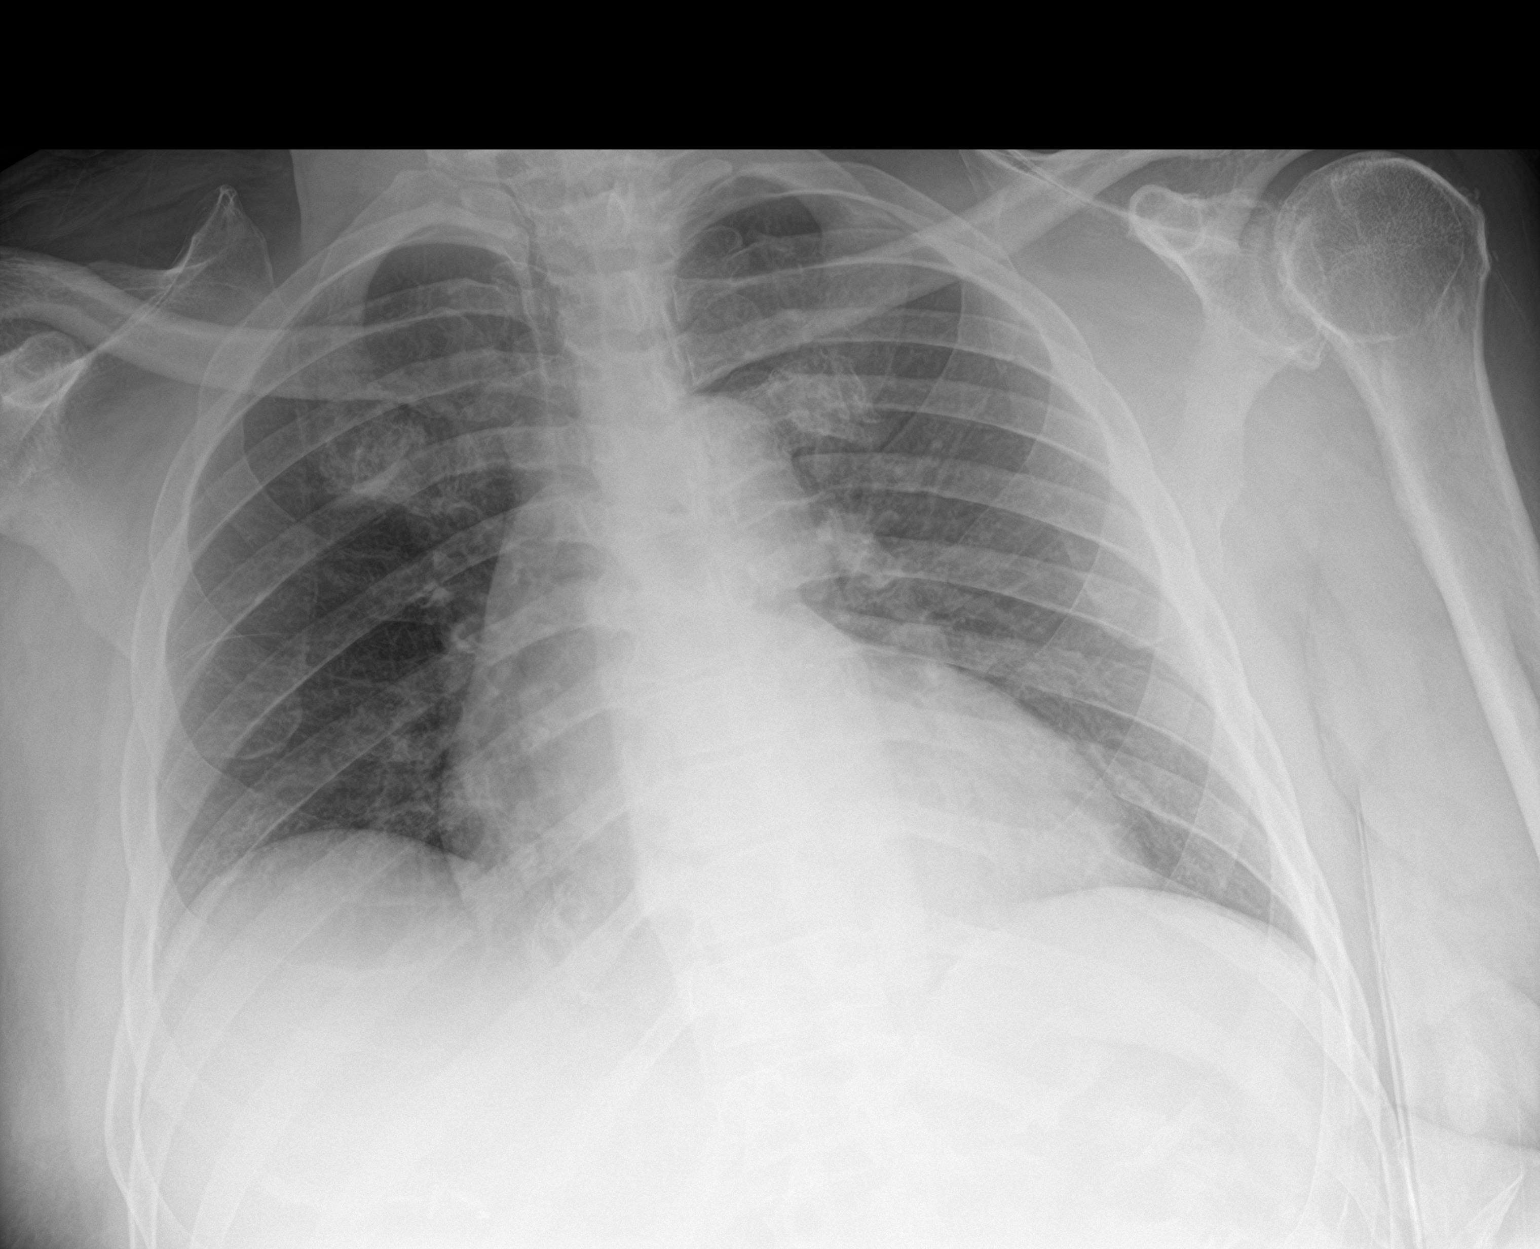

[2 of 2 positions shown; findings below may reference images not displayed]

FINDINGS: Cardiomegaly with normal pulmonary vascularity. Mild interstitial
prominence. Mild CHF cannot be excluded P Low lung volumes. No
pleural effusion or pneumothorax.
IMPRESSION: Cardiomegaly with mild interstitial prominence. Mild CHF cannot be
excluded. Low lung volumes.
